# Patient Record
Sex: Female | Born: 1940 | Race: White | Hispanic: No | Smoking: Former smoker
Health system: Southern US, Community
[De-identification: ages and names within clinical notes are randomized; demographics above are authoritative.]

## PROBLEM LIST (undated history)

## (undated) DIAGNOSIS — G629 Polyneuropathy, unspecified: Secondary | ICD-10-CM

## (undated) DIAGNOSIS — D62 Acute posthemorrhagic anemia: Secondary | ICD-10-CM

## (undated) DIAGNOSIS — K219 Gastro-esophageal reflux disease without esophagitis: Secondary | ICD-10-CM

## (undated) DIAGNOSIS — K2100 Gastro-esophageal reflux disease with esophagitis, without bleeding: Secondary | ICD-10-CM

## (undated) DIAGNOSIS — H9190 Unspecified hearing loss, unspecified ear: Secondary | ICD-10-CM

## (undated) DIAGNOSIS — E538 Deficiency of other specified B group vitamins: Secondary | ICD-10-CM

## (undated) DIAGNOSIS — K449 Diaphragmatic hernia without obstruction or gangrene: Secondary | ICD-10-CM

## (undated) DIAGNOSIS — K5732 Diverticulitis of large intestine without perforation or abscess without bleeding: Secondary | ICD-10-CM

## (undated) DIAGNOSIS — I25810 Atherosclerosis of coronary artery bypass graft(s) without angina pectoris: Secondary | ICD-10-CM

## (undated) DIAGNOSIS — I714 Abdominal aortic aneurysm, without rupture, unspecified: Secondary | ICD-10-CM

## (undated) DIAGNOSIS — M1990 Unspecified osteoarthritis, unspecified site: Secondary | ICD-10-CM

## (undated) DIAGNOSIS — I739 Peripheral vascular disease, unspecified: Secondary | ICD-10-CM

## (undated) DIAGNOSIS — I499 Cardiac arrhythmia, unspecified: Secondary | ICD-10-CM

## (undated) DIAGNOSIS — Z720 Tobacco use: Secondary | ICD-10-CM

## (undated) DIAGNOSIS — E785 Hyperlipidemia, unspecified: Secondary | ICD-10-CM

## (undated) DIAGNOSIS — K635 Polyp of colon: Secondary | ICD-10-CM

## (undated) DIAGNOSIS — K589 Irritable bowel syndrome without diarrhea: Secondary | ICD-10-CM

## (undated) DIAGNOSIS — F329 Major depressive disorder, single episode, unspecified: Secondary | ICD-10-CM

## (undated) DIAGNOSIS — I10 Essential (primary) hypertension: Secondary | ICD-10-CM

## (undated) DIAGNOSIS — F32A Depression, unspecified: Secondary | ICD-10-CM

## (undated) DIAGNOSIS — R42 Dizziness and giddiness: Secondary | ICD-10-CM

## (undated) DIAGNOSIS — I00 Rheumatic fever without heart involvement: Secondary | ICD-10-CM

## (undated) HISTORY — DX: Diaphragmatic hernia without obstruction or gangrene: K44.9

## (undated) HISTORY — DX: Deficiency of other specified B group vitamins: E53.8

## (undated) HISTORY — DX: Gastro-esophageal reflux disease with esophagitis: K21.0

## (undated) HISTORY — DX: Diverticulitis of large intestine without perforation or abscess without bleeding: K57.32

## (undated) HISTORY — PX: PARTIAL HYSTERECTOMY: SHX80

## (undated) HISTORY — DX: Acute posthemorrhagic anemia: D62

## (undated) HISTORY — DX: Cardiac arrhythmia, unspecified: I49.9

## (undated) HISTORY — PX: CAROTID ENDARTERECTOMY: SUR193

## (undated) HISTORY — DX: Gastro-esophageal reflux disease with esophagitis, without bleeding: K21.00

## (undated) HISTORY — DX: Polyp of colon: K63.5

## (undated) HISTORY — DX: Abdominal aortic aneurysm, without rupture, unspecified: I71.40

## (undated) HISTORY — DX: Gastro-esophageal reflux disease without esophagitis: K21.9

## (undated) HISTORY — DX: Atherosclerosis of coronary artery bypass graft(s) without angina pectoris: I25.810

## (undated) HISTORY — PX: CLOSED MANIPULATION SHOULDER: SUR205

## (undated) HISTORY — PX: OTHER SURGICAL HISTORY: SHX169

## (undated) HISTORY — DX: Polyneuropathy, unspecified: G62.9

## (undated) HISTORY — PX: BACK SURGERY: SHX140

## (undated) HISTORY — DX: Tobacco use: Z72.0

## (undated) HISTORY — DX: Peripheral vascular disease, unspecified: I73.9

## (undated) HISTORY — DX: Rheumatic fever without heart involvement: I00

## (undated) HISTORY — DX: Abdominal aortic aneurysm, without rupture: I71.4

## (undated) HISTORY — PX: VASCULAR SURGERY: SHX849

## (undated) HISTORY — DX: Irritable bowel syndrome, unspecified: K58.9

## (undated) HISTORY — DX: Hyperlipidemia, unspecified: E78.5

## (undated) HISTORY — PX: ABDOMINAL HYSTERECTOMY: SHX81

---

## 1969-11-06 HISTORY — PX: LUMBAR DISC SURGERY: SHX700

## 1972-11-06 HISTORY — PX: APPENDECTOMY: SHX54

## 1974-11-06 HISTORY — PX: ABDOMINAL SURGERY: SHX537

## 2000-11-06 HISTORY — PX: ROTATOR CUFF REPAIR: SHX139

## 2000-11-06 HISTORY — PX: CHOLECYSTECTOMY: SHX55

## 2003-04-21 LAB — HM COLONOSCOPY

## 2004-11-06 HISTORY — PX: CORONARY ARTERY BYPASS GRAFT: SHX141

## 2005-01-17 ENCOUNTER — Encounter: Payer: Self-pay | Admitting: Cardiovascular Disease

## 2005-02-04 ENCOUNTER — Encounter: Payer: Self-pay | Admitting: Cardiovascular Disease

## 2005-03-06 ENCOUNTER — Encounter: Payer: Self-pay | Admitting: Cardiovascular Disease

## 2005-04-06 ENCOUNTER — Encounter: Payer: Self-pay | Admitting: Cardiovascular Disease

## 2005-11-15 ENCOUNTER — Ambulatory Visit: Payer: Self-pay | Admitting: Family Medicine

## 2005-11-20 ENCOUNTER — Ambulatory Visit: Payer: Self-pay | Admitting: Family Medicine

## 2005-11-23 ENCOUNTER — Ambulatory Visit: Payer: Self-pay | Admitting: Unknown Physician Specialty

## 2007-01-22 ENCOUNTER — Ambulatory Visit: Payer: Self-pay | Admitting: Unknown Physician Specialty

## 2007-11-07 HISTORY — PX: CERVICAL DISC SURGERY: SHX588

## 2007-12-06 ENCOUNTER — Other Ambulatory Visit: Payer: Self-pay

## 2007-12-06 ENCOUNTER — Ambulatory Visit: Payer: Self-pay | Admitting: Internal Medicine

## 2008-01-23 ENCOUNTER — Ambulatory Visit: Payer: Self-pay | Admitting: Unknown Physician Specialty

## 2008-04-02 ENCOUNTER — Ambulatory Visit: Payer: Self-pay | Admitting: Internal Medicine

## 2008-10-22 ENCOUNTER — Ambulatory Visit: Payer: Self-pay | Admitting: Internal Medicine

## 2009-02-03 ENCOUNTER — Ambulatory Visit: Payer: Self-pay | Admitting: Unknown Physician Specialty

## 2009-03-12 LAB — HM SIGMOIDOSCOPY

## 2009-06-17 ENCOUNTER — Ambulatory Visit: Payer: Self-pay | Admitting: Internal Medicine

## 2009-07-28 LAB — HM COLONOSCOPY

## 2009-12-15 ENCOUNTER — Other Ambulatory Visit: Payer: Self-pay | Admitting: Family

## 2010-02-11 ENCOUNTER — Ambulatory Visit: Payer: Self-pay | Admitting: Unknown Physician Specialty

## 2010-03-06 HISTORY — PX: HERNIA REPAIR: SHX51

## 2010-03-22 ENCOUNTER — Ambulatory Visit: Payer: Self-pay | Admitting: Surgery

## 2010-03-29 ENCOUNTER — Inpatient Hospital Stay: Payer: Self-pay | Admitting: Surgery

## 2010-07-26 ENCOUNTER — Ambulatory Visit: Payer: Self-pay | Admitting: Orthopedic Surgery

## 2011-01-03 ENCOUNTER — Ambulatory Visit: Payer: Self-pay | Admitting: Internal Medicine

## 2011-01-05 HISTORY — PX: OTHER SURGICAL HISTORY: SHX169

## 2011-02-14 ENCOUNTER — Ambulatory Visit: Payer: Self-pay | Admitting: Unknown Physician Specialty

## 2011-03-08 ENCOUNTER — Ambulatory Visit: Payer: Self-pay | Admitting: Unknown Physician Specialty

## 2011-03-10 ENCOUNTER — Ambulatory Visit: Payer: Self-pay | Admitting: Internal Medicine

## 2011-04-18 ENCOUNTER — Ambulatory Visit: Payer: Self-pay | Admitting: Internal Medicine

## 2011-04-24 ENCOUNTER — Other Ambulatory Visit: Payer: Self-pay | Admitting: Neurology

## 2011-04-24 DIAGNOSIS — G43909 Migraine, unspecified, not intractable, without status migrainosus: Secondary | ICD-10-CM

## 2011-04-24 DIAGNOSIS — H5710 Ocular pain, unspecified eye: Secondary | ICD-10-CM

## 2011-04-24 DIAGNOSIS — Z951 Presence of aortocoronary bypass graft: Secondary | ICD-10-CM

## 2011-04-24 DIAGNOSIS — G609 Hereditary and idiopathic neuropathy, unspecified: Secondary | ICD-10-CM

## 2011-04-25 ENCOUNTER — Other Ambulatory Visit: Payer: Self-pay | Admitting: Neurology

## 2011-04-25 DIAGNOSIS — G43909 Migraine, unspecified, not intractable, without status migrainosus: Secondary | ICD-10-CM

## 2011-04-25 DIAGNOSIS — Z951 Presence of aortocoronary bypass graft: Secondary | ICD-10-CM

## 2011-04-25 DIAGNOSIS — H5710 Ocular pain, unspecified eye: Secondary | ICD-10-CM

## 2011-04-25 DIAGNOSIS — G609 Hereditary and idiopathic neuropathy, unspecified: Secondary | ICD-10-CM

## 2011-04-29 ENCOUNTER — Inpatient Hospital Stay: Admission: RE | Admit: 2011-04-29 | Payer: Self-pay | Source: Ambulatory Visit

## 2011-04-29 ENCOUNTER — Other Ambulatory Visit: Payer: Self-pay

## 2011-04-30 ENCOUNTER — Ambulatory Visit
Admission: RE | Admit: 2011-04-30 | Discharge: 2011-04-30 | Disposition: A | Discharge status: Home / Self Care | Payer: Medicare Other | Source: Ambulatory Visit | Attending: Neurology | Admitting: Neurology

## 2011-04-30 DIAGNOSIS — Z951 Presence of aortocoronary bypass graft: Secondary | ICD-10-CM

## 2011-04-30 DIAGNOSIS — G43909 Migraine, unspecified, not intractable, without status migrainosus: Secondary | ICD-10-CM

## 2011-04-30 DIAGNOSIS — G609 Hereditary and idiopathic neuropathy, unspecified: Secondary | ICD-10-CM

## 2011-04-30 DIAGNOSIS — H5710 Ocular pain, unspecified eye: Secondary | ICD-10-CM

## 2011-04-30 MED ORDER — GADOBENATE DIMEGLUMINE 529 MG/ML IV SOLN
14.0000 mL | Freq: Once | INTRAVENOUS | Status: AC | PRN
  Administered 2011-04-30: 14 mL via INTRAVENOUS

## 2011-06-13 ENCOUNTER — Encounter: Payer: Self-pay | Admitting: Internal Medicine

## 2011-07-22 ENCOUNTER — Encounter: Payer: Self-pay | Admitting: Internal Medicine

## 2011-08-29 ENCOUNTER — Encounter: Payer: Self-pay | Admitting: Internal Medicine

## 2011-08-29 ENCOUNTER — Ambulatory Visit (INDEPENDENT_AMBULATORY_CARE_PROVIDER_SITE_OTHER): Payer: Medicare Other | Admitting: Internal Medicine

## 2011-08-29 DIAGNOSIS — Z720 Tobacco use: Secondary | ICD-10-CM | POA: Insufficient documentation

## 2011-08-29 DIAGNOSIS — E785 Hyperlipidemia, unspecified: Secondary | ICD-10-CM

## 2011-08-29 DIAGNOSIS — Z716 Tobacco abuse counseling: Secondary | ICD-10-CM

## 2011-08-29 DIAGNOSIS — M8580 Other specified disorders of bone density and structure, unspecified site: Secondary | ICD-10-CM | POA: Insufficient documentation

## 2011-08-29 DIAGNOSIS — M899 Disorder of bone, unspecified: Secondary | ICD-10-CM

## 2011-08-29 DIAGNOSIS — E559 Vitamin D deficiency, unspecified: Secondary | ICD-10-CM | POA: Insufficient documentation

## 2011-08-29 DIAGNOSIS — Z79899 Other long term (current) drug therapy: Secondary | ICD-10-CM

## 2011-08-29 DIAGNOSIS — J029 Acute pharyngitis, unspecified: Secondary | ICD-10-CM

## 2011-08-29 MED ORDER — AMOXICILLIN-POT CLAVULANATE 875-125 MG PO TABS: 1.0000 | ORAL_TABLET | Freq: Two times a day (BID) | ORAL | Status: AC

## 2011-08-29 NOTE — Progress Notes (Signed)
Subjective:    Patient ID: Sabrina Montoya, female    DOB: 1941/06/01, 70 y.o.   MRN: [id number]  HPI  70 yo white female with a history of CAD, tobacco abuse presents with a  1 .5 wk history of ear pain danpost nasal drip.  Her left ear pain radiates to her jaw, accompanied by bilateral neck pain.  Past Medical History  Diagnosis Date  . Diverticulitis of colon   . Rheumatic fever     possible at age 72  . Tobacco abuse    Current Outpatient Prescriptions on File Prior to Visit  Medication Sig Dispense Refill  . aspirin 81 MG tablet Take 81 mg by mouth daily.        Marland Kitchen atorvastatin (LIPITOR) 80 MG tablet Take 80 mg by mouth daily.        . benzonatate (TESSALON PERLES) 100 MG capsule Take 100 mg by mouth 3 (three) times daily as needed.        . clonazePAM (KLONOPIN) 0.5 MG tablet Take 0.5 mg by mouth 2 (two) times daily as needed.        . gabapentin (NEURONTIN) 100 MG capsule Take 100 mg by mouth daily.       . hydrochlorothiazide (HYDRODIURIL) 25 MG tablet Take 25 mg by mouth daily.        . metoprolol (LOPRESSOR) 50 MG tablet Take 50 mg by mouth 2 (two) times daily.        . nitroGLYCERIN (NITROSTAT) 0.4 MG SL tablet Place 0.4 mg under the tongue every 5 (five) minutes as needed. As needed for chest pain       . omeprazole (PRILOSEC) 40 MG capsule Take 40 mg by mouth daily.          Review of Systems  Constitutional: Negative for fever, chills and unexpected weight change.  HENT: Negative for hearing loss, ear pain, nosebleeds, congestion, sore throat, facial swelling, rhinorrhea, sneezing, mouth sores, trouble swallowing, neck pain, neck stiffness, voice change, postnasal drip, sinus pressure, tinnitus and ear discharge.   Eyes: Negative for pain, discharge, redness and visual disturbance.  Respiratory: Negative for cough, chest tightness, shortness of breath, wheezing and stridor.   Cardiovascular: Negative for chest pain, palpitations and leg swelling.  Musculoskeletal: Negative for  myalgias and arthralgias.  Skin: Negative for color change and rash.  Neurological: Negative for dizziness, weakness, light-headedness and headaches.  Hematological: Negative for adenopathy.       Objective:   Physical Exam  Constitutional: She is oriented to person, place, and time. She appears well-developed and well-nourished.  HENT:  Right Ear: Tympanic membrane is erythematous and bulging.  Left Ear: Tympanic membrane is erythematous and bulging.  Mouth/Throat: Oropharynx is clear and moist.  Eyes: EOM are normal. Pupils are equal, round, and reactive to light. No scleral icterus.  Neck: Normal range of motion. Neck supple. No JVD present. No thyromegaly present.  Cardiovascular: Normal rate, regular rhythm, normal heart sounds and intact distal pulses.   Pulmonary/Chest: Effort normal and breath sounds normal.  Abdominal: Soft. Bowel sounds are normal. She exhibits no mass. There is no tenderness.  Musculoskeletal: Normal range of motion. She exhibits no edema.  Lymphadenopathy:    She has no cervical adenopathy.  Neurological: She is alert and oriented to person, place, and time.  Skin: Skin is warm and dry.  Psychiatric: She has a normal mood and affect.          Assessment & Plan:  Sinusitis/otitis: with cervical  LAD.  Will treat wit augmentin,. Decongestants.

## 2011-08-29 NOTE — Patient Instructions (Addendum)
Debrox is the ear cleanser that works to dissolve earwax.   Or try the "Earcandle" that you light on fire (available at [facility] or other health food stores)   We are going to put you on augmentin (amoxicillin antibiotic) for coverage of any ear/throat infection.  Take twice daily with breakfast and dinner.    Do the sinus lavage twice daily with either alkalol or simply saline.  Return tomorrow at 8 am for fasting labs  (please drink a little water and ake your medicaitons)

## 2011-08-30 ENCOUNTER — Encounter: Payer: Self-pay | Admitting: Internal Medicine

## 2011-08-30 ENCOUNTER — Other Ambulatory Visit (INDEPENDENT_AMBULATORY_CARE_PROVIDER_SITE_OTHER): Payer: Medicare Other | Admitting: *Deleted

## 2011-08-30 DIAGNOSIS — Z79899 Other long term (current) drug therapy: Secondary | ICD-10-CM

## 2011-08-30 DIAGNOSIS — E785 Hyperlipidemia, unspecified: Secondary | ICD-10-CM | POA: Insufficient documentation

## 2011-08-30 DIAGNOSIS — E559 Vitamin D deficiency, unspecified: Secondary | ICD-10-CM

## 2011-08-30 DIAGNOSIS — Z716 Tobacco abuse counseling: Secondary | ICD-10-CM | POA: Insufficient documentation

## 2011-08-30 LAB — COMPREHENSIVE METABOLIC PANEL
ALT: 20 U/L (ref 0–35)
AST: 36 U/L (ref 0–37)
Alkaline Phosphatase: 72 U/L (ref 39–117)
Sodium: 143 mEq/L (ref 135–145)
Total Bilirubin: 0.4 mg/dL (ref 0.3–1.2)
Total Protein: 7.5 g/dL (ref 6.0–8.3)

## 2011-08-30 LAB — LIPID PANEL
LDL Cholesterol: 72 mg/dL (ref 0–99)
VLDL: 35.8 mg/dL (ref 0.0–40.0)

## 2011-08-30 NOTE — Assessment & Plan Note (Signed)
With prior use of Drisdol.  She has been taking 5000 units daily instead of 1000 at the recommendation of a health food store clerk.  Vi D level to be checked this week.

## 2011-08-30 NOTE — Assessment & Plan Note (Signed)
By prior DEXA Stann Mainland review DEXA and determine need for therapy

## 2011-08-31 LAB — VITAMIN D 25 HYDROXY (VIT D DEFICIENCY, FRACTURES): Vit D, 25-Hydroxy: 77 ng/mL (ref 30–89)

## 2011-09-11 ENCOUNTER — Telehealth: Payer: Self-pay | Admitting: Internal Medicine

## 2011-09-11 DIAGNOSIS — F419 Anxiety disorder, unspecified: Secondary | ICD-10-CM

## 2011-09-13 MED ORDER — CLONAZEPAM 0.5 MG PO TABS: 0.5000 mg | ORAL_TABLET | Freq: Two times a day (BID) | ORAL | Status: DC | PRN

## 2011-09-14 NOTE — Telephone Encounter (Signed)
Rx has been called in  

## 2011-10-10 ENCOUNTER — Ambulatory Visit: Payer: Medicare Other

## 2011-10-10 ENCOUNTER — Ambulatory Visit (INDEPENDENT_AMBULATORY_CARE_PROVIDER_SITE_OTHER): Payer: Medicare Other | Admitting: Internal Medicine

## 2011-10-10 DIAGNOSIS — Z23 Encounter for immunization: Secondary | ICD-10-CM

## 2011-11-03 ENCOUNTER — Other Ambulatory Visit: Payer: Self-pay | Admitting: *Deleted

## 2011-11-03 MED ORDER — ATORVASTATIN CALCIUM 80 MG PO TABS: 80.0000 mg | ORAL_TABLET | Freq: Every day | ORAL | Status: DC

## 2011-11-03 MED ORDER — HYDROCHLOROTHIAZIDE 25 MG PO TABS: 25.0000 mg | ORAL_TABLET | Freq: Every day | ORAL | Status: DC

## 2011-11-27 ENCOUNTER — Telehealth: Payer: Self-pay | Admitting: Internal Medicine

## 2011-11-27 NOTE — Telephone Encounter (Signed)
Patient wants to know when she should have blood work done to check on her cholesterol.

## 2011-11-27 NOTE — Telephone Encounter (Signed)
Her cholesterol was fine in late October, so typically every 6 months is fine.  Fasting lipids, CMET in april.

## 2011-11-27 NOTE — Telephone Encounter (Signed)
Patient notified.  Lab appt scheduled.

## 2011-12-04 ENCOUNTER — Other Ambulatory Visit: Payer: Self-pay | Admitting: *Deleted

## 2011-12-04 MED ORDER — METOPROLOL TARTRATE 50 MG PO TABS: 50.0000 mg | ORAL_TABLET | Freq: Two times a day (BID) | ORAL | Status: DC

## 2011-12-06 DIAGNOSIS — H2510 Age-related nuclear cataract, unspecified eye: Secondary | ICD-10-CM | POA: Diagnosis not present

## 2011-12-11 ENCOUNTER — Other Ambulatory Visit: Payer: Self-pay | Admitting: *Deleted

## 2011-12-11 NOTE — Telephone Encounter (Signed)
Faxed request from [facility], last filled 09/09/11.

## 2011-12-12 ENCOUNTER — Encounter: Payer: Self-pay | Admitting: Internal Medicine

## 2011-12-12 MED ORDER — GABAPENTIN 100 MG PO CAPS: 100.0000 mg | ORAL_CAPSULE | Freq: Three times a day (TID) | ORAL | Status: DC

## 2012-01-02 DIAGNOSIS — I714 Abdominal aortic aneurysm, without rupture: Secondary | ICD-10-CM | POA: Diagnosis not present

## 2012-01-02 DIAGNOSIS — I10 Essential (primary) hypertension: Secondary | ICD-10-CM | POA: Diagnosis not present

## 2012-01-02 DIAGNOSIS — I6529 Occlusion and stenosis of unspecified carotid artery: Secondary | ICD-10-CM | POA: Diagnosis not present

## 2012-01-02 DIAGNOSIS — F17200 Nicotine dependence, unspecified, uncomplicated: Secondary | ICD-10-CM | POA: Diagnosis not present

## 2012-01-05 ENCOUNTER — Encounter: Payer: Self-pay | Admitting: Internal Medicine

## 2012-01-05 DIAGNOSIS — I6529 Occlusion and stenosis of unspecified carotid artery: Secondary | ICD-10-CM | POA: Insufficient documentation

## 2012-01-19 DIAGNOSIS — I6529 Occlusion and stenosis of unspecified carotid artery: Secondary | ICD-10-CM | POA: Diagnosis not present

## 2012-01-19 DIAGNOSIS — I658 Occlusion and stenosis of other precerebral arteries: Secondary | ICD-10-CM | POA: Diagnosis not present

## 2012-01-19 DIAGNOSIS — Z87891 Personal history of nicotine dependence: Secondary | ICD-10-CM | POA: Diagnosis not present

## 2012-01-19 DIAGNOSIS — Z8673 Personal history of transient ischemic attack (TIA), and cerebral infarction without residual deficits: Secondary | ICD-10-CM | POA: Diagnosis not present

## 2012-01-19 DIAGNOSIS — I10 Essential (primary) hypertension: Secondary | ICD-10-CM | POA: Diagnosis not present

## 2012-01-23 DIAGNOSIS — I6529 Occlusion and stenosis of unspecified carotid artery: Secondary | ICD-10-CM | POA: Diagnosis not present

## 2012-01-23 DIAGNOSIS — G5620 Lesion of ulnar nerve, unspecified upper limb: Secondary | ICD-10-CM | POA: Diagnosis not present

## 2012-01-23 DIAGNOSIS — G5600 Carpal tunnel syndrome, unspecified upper limb: Secondary | ICD-10-CM | POA: Diagnosis not present

## 2012-02-01 DIAGNOSIS — Z01419 Encounter for gynecological examination (general) (routine) without abnormal findings: Secondary | ICD-10-CM | POA: Diagnosis not present

## 2012-02-05 DIAGNOSIS — Z01419 Encounter for gynecological examination (general) (routine) without abnormal findings: Secondary | ICD-10-CM | POA: Diagnosis not present

## 2012-02-12 ENCOUNTER — Telehealth: Payer: Self-pay | Admitting: *Deleted

## 2012-02-12 ENCOUNTER — Other Ambulatory Visit (INDEPENDENT_AMBULATORY_CARE_PROVIDER_SITE_OTHER): Payer: Medicare Other | Admitting: *Deleted

## 2012-02-12 DIAGNOSIS — E785 Hyperlipidemia, unspecified: Secondary | ICD-10-CM

## 2012-02-12 DIAGNOSIS — R197 Diarrhea, unspecified: Secondary | ICD-10-CM

## 2012-02-12 DIAGNOSIS — K5792 Diverticulitis of intestine, part unspecified, without perforation or abscess without bleeding: Secondary | ICD-10-CM

## 2012-02-12 LAB — COMPREHENSIVE METABOLIC PANEL
Albumin: 4 g/dL (ref 3.5–5.2)
Alkaline Phosphatase: 62 U/L (ref 39–117)
BUN: 28 mg/dL — ABNORMAL HIGH (ref 6–23)
Creatinine, Ser: 1 mg/dL (ref 0.4–1.2)
Glucose, Bld: 105 mg/dL — ABNORMAL HIGH (ref 70–99)
Potassium: 3.1 mEq/L — ABNORMAL LOW (ref 3.5–5.1)
Total Bilirubin: 0.3 mg/dL (ref 0.3–1.2)

## 2012-02-12 LAB — LIPID PANEL
Cholesterol: 142 mg/dL (ref 0–200)
LDL Cholesterol: 83 mg/dL (ref 0–99)
Triglycerides: 137 mg/dL (ref 0.0–149.0)

## 2012-02-12 NOTE — Telephone Encounter (Signed)
Done

## 2012-02-12 NOTE — Telephone Encounter (Signed)
Patient came in for labs today and while she was here she said that she has had several episodes of diverticulitis in the past and she feels like she is having issues with it again. She says that for the last couple of weeks she has had stomach pain and some diarrhea. I offered to make her an appt, but she says that she doesn't want to make an appt right now, but is asking if you would want to add on any other labs to check for this. I drew a cmet and lipid and a couple of extra tubes in case you wanted to add anything.

## 2012-02-12 NOTE — Progress Notes (Signed)
Addended by: Jobie Quaker on: 02/12/2012 01:52 PM   Modules accepted: Orders

## 2012-02-12 NOTE — Telephone Encounter (Signed)
xtra labs added,  thanks

## 2012-02-13 LAB — CBC WITH DIFFERENTIAL/PLATELET
Basophils Relative: 0 % (ref 0–1)
Eosinophils Absolute: 0.2 10*3/uL (ref 0.0–0.7)
Hemoglobin: 12.1 g/dL (ref 12.0–15.0)
MCH: 29.5 pg (ref 26.0–34.0)
MCHC: 32.8 g/dL (ref 30.0–36.0)
Monocytes Absolute: 0.4 10*3/uL (ref 0.1–1.0)
Monocytes Relative: 8 % (ref 3–12)
Neutrophils Relative %: 53 % (ref 43–77)

## 2012-02-13 LAB — C-REACTIVE PROTEIN: CRP: 0.17 mg/dL (ref <0.60)

## 2012-02-19 MED ORDER — POTASSIUM CHLORIDE ER 10 MEQ PO TBCR: 10.0000 meq | EXTENDED_RELEASE_TABLET | Freq: Two times a day (BID) | ORAL | Status: DC

## 2012-02-19 NOTE — Progress Notes (Signed)
Addended by: Darletta Moll A on: 02/19/2012 10:08 AM   Modules accepted: Orders

## 2012-02-21 DIAGNOSIS — Z1231 Encounter for screening mammogram for malignant neoplasm of breast: Secondary | ICD-10-CM | POA: Diagnosis not present

## 2012-02-21 DIAGNOSIS — Z1239 Encounter for other screening for malignant neoplasm of breast: Secondary | ICD-10-CM | POA: Diagnosis not present

## 2012-02-28 ENCOUNTER — Encounter: Payer: Self-pay | Admitting: Internal Medicine

## 2012-02-28 ENCOUNTER — Ambulatory Visit (INDEPENDENT_AMBULATORY_CARE_PROVIDER_SITE_OTHER): Payer: Medicare Other | Admitting: Internal Medicine

## 2012-02-28 VITALS — BP 106/60 | HR 59 | Temp 98.9°F | Resp 14 | Wt 148.0 lb

## 2012-02-28 DIAGNOSIS — Z780 Asymptomatic menopausal state: Secondary | ICD-10-CM

## 2012-02-28 DIAGNOSIS — M1990 Unspecified osteoarthritis, unspecified site: Secondary | ICD-10-CM

## 2012-02-28 DIAGNOSIS — M129 Arthropathy, unspecified: Secondary | ICD-10-CM | POA: Diagnosis not present

## 2012-02-28 DIAGNOSIS — N951 Menopausal and female climacteric states: Secondary | ICD-10-CM

## 2012-02-28 DIAGNOSIS — R140 Abdominal distension (gaseous): Secondary | ICD-10-CM

## 2012-02-28 DIAGNOSIS — N838 Other noninflammatory disorders of ovary, fallopian tube and broad ligament: Secondary | ICD-10-CM

## 2012-02-28 DIAGNOSIS — K5732 Diverticulitis of large intestine without perforation or abscess without bleeding: Secondary | ICD-10-CM | POA: Diagnosis not present

## 2012-02-28 DIAGNOSIS — R1032 Left lower quadrant pain: Secondary | ICD-10-CM

## 2012-02-28 DIAGNOSIS — R141 Gas pain: Secondary | ICD-10-CM

## 2012-02-28 DIAGNOSIS — R1013 Epigastric pain: Secondary | ICD-10-CM

## 2012-02-28 DIAGNOSIS — R143 Flatulence: Secondary | ICD-10-CM

## 2012-02-28 LAB — CBC WITH DIFFERENTIAL/PLATELET
Basophils Relative: 0.5 % (ref 0.0–3.0)
Eosinophils Absolute: 0.1 10*3/uL (ref 0.0–0.7)
HCT: 35.3 % — ABNORMAL LOW (ref 36.0–46.0)
Hemoglobin: 12 g/dL (ref 12.0–15.0)
Lymphocytes Relative: 39.3 % (ref 12.0–46.0)
Lymphs Abs: 1.9 10*3/uL (ref 0.7–4.0)
MCHC: 33.9 g/dL (ref 30.0–36.0)
Neutro Abs: 2.4 10*3/uL (ref 1.4–7.7)
RBC: 3.94 Mil/uL (ref 3.87–5.11)
RDW: 13.5 % (ref 11.5–14.6)

## 2012-02-28 MED ORDER — CIPROFLOXACIN HCL 250 MG PO TABS: 250.0000 mg | ORAL_TABLET | Freq: Two times a day (BID) | ORAL | Status: DC

## 2012-02-28 MED ORDER — METRONIDAZOLE 500 MG PO TABS: 500.0000 mg | ORAL_TABLET | Freq: Three times a day (TID) | ORAL | Status: DC

## 2012-02-28 NOTE — Progress Notes (Signed)
Patient ID: Sabrina Montoya, female   DOB: 07/31/1941, 71 y.o.   MRN: [id number]  Patient Active Problem List  Diagnoses  . Osteopenia  . Vitamin D deficiency  . Tobacco abuse  . Hyperlipidemia  . Tobacco abuse counseling  . Aneurysm of abdominal aorta  . Carotid artery stenosis, asymptomatic  . Abdominal pain, acute, left lower quadrant  . Abdominal pain, acute, epigastric    Subjective:  CC:   Chief Complaint  Patient presents with  . Diverticulitis    flare up    HPI:   Sabrina Montoya a 71 y.o. female who presents  Past Medical History  Diagnosis Date  . Diverticulitis of colon   . Rheumatic fever     possible at age 36  . Tobacco abuse   . Hyperlipidemia     Past Surgical History  Procedure Date  . Closed manipulation shoulder     left shoulder post physical therapy, Right shoulder redo (toggle bolts)  . Rotator cuff repair 2002    right shoulder Dr. Vernell Leep, [facility] in [place]  . Cea     Carotid stenosis found during workup for dysphagia,  [facility]  . Lumbar disc surgery 1971    L5, unssuccessful, fusion in 1983 successful (2 lumbar)  . Abdominal surgery 1976    for pain secondary to scar tissue, s/p apply  . 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 Hernia repair may 2011    Dr. Michela Pitcher  . Coronary artery bypass graft 2006    s/p triple bypass surgery, [facility]  . Cholecystectomy 2002    Dr. Michela Pitcher  . Coronary angiography 01/2011    one occluded artery with collateralization, other arteries patent  . Hernia repair   . Appendectomy 1974         The following portions of the patient's history were reviewed and updated as appropriate: Allergies, current medications, and problem list.    Review of Systems:   12 Pt  review of systems was negative except those addressed in the HPI,     History   Social History  . Marital Status: Married    Spouse Name: N/A    Number of Children: N/A  .  Years of Education: N/A   Occupational History  . Not on file.   Social History Main Topics  . Smoking status: Current Some Day Smoker -- 0.5 packs/day for 30 years  . Smokeless tobacco: Never Used   Comment: quit for 2 years after sinus infection and 6 months after heart surgery  . Alcohol Use: No  . Drug Use: No  . Sexually Active: Not on file   Other Topics Concern  . Not on file   Social History Narrative   Lives with spouse Sabrina Montoya    Objective:  BP 106/60  Pulse 59  Temp(Src) 98.9 F (37.2 C) (Oral)  Resp 14  Wt 148 lb (67.132 kg)  SpO2 97%  General appearance: alert, cooperative and appears stated age Ears: normal TM's and external ear canals both ears Throat: lips, mucosa, and tongue normal; teeth and gums normal Neck: no adenopathy, no carotid bruit, supple, symmetrical, trachea midline and thyroid not enlarged, symmetric, no tenderness/mass/nodules Back: symmetric, no curvature. ROM normal. No CVA tenderness. Lungs: clear to auscultation bilaterally Heart: regular rate and rhythm, S1, S2 normal, no murmur, click, rub or gallop Abdomen: soft, tender in mid epigastric regionand LLQ, bowel sounds normal; no masses,  no  organomegaly Pulses: 2+ and symmetric Skin: Skin color, texture, turgor normal. No rashes or lesions Lymph nodes: Cervical, supraclavicular, and axillary nodes normal.  Assessment and Plan: Abdominal pain, acute, left lower quadrant Her recent episode does imply some form of colitis given the increased stooling she has had,.  She has a history of diverticulosis with prior flares, but also has an AAA,  still has her ovaries and is a smoker, so the ddx is broad.  Discussed looking for signs of infection/inflammation with screening labs,an empiric tx for diverticulitis  but she weill need some form of definitive imaging.  Will recommend ct abdomen and pelvis with contrast to evaluate aorta, bowels and adnexa..  Abdominal pain, acute,  epigastric Etiology unclear.  She insists that the pain is due to her prior hernia repair .  She does not recall when her last EGD was. There are no signs of inflammation or infection currently by serology, will await results of CT and consider referral to hernia specialist  at [facility] for reevaluation of ventral hern  Arthritis She continues to have multiple joint aches particularly her hands , and after spending 25 minutes sorting out her history of abdominal pain, is asking for management of her joint pain.  Given the chronic nature of her pain, I sent serologies for RA and inflammatory states, all of which were negative.  I will evaluate her joints again at her return visit in 1 month    Updated Medication List Outpatient Encounter Prescriptions as of 02/28/2012  Medication Sig Dispense Refill  . aspirin 81 MG tablet Take 81 mg by mouth daily.        Marland Kitchen atorvastatin (LIPITOR) 80 MG tablet Take 1 tablet (80 mg total) by mouth daily.  90 tablet  1  . Belladonna Alk-Phenobarbital (DONNATAL PO) Take 1 tablet by mouth 2 (two) times daily.        . benzonatate (TESSALON PERLES) 100 MG capsule Take 100 mg by mouth 3 (three) times daily as needed.        . clonazePAM (KLONOPIN) 0.5 MG tablet Take 1 tablet (0.5 mg total) by mouth 2 (two) times daily as needed.  60 tablet  2  . estradiol (ESTRACE) 0.1 MG/GM vaginal cream Place 2 g vaginally daily.      Marland Kitchen gabapentin (NEURONTIN) 100 MG capsule Take 1 capsule (100 mg total) by mouth 3 (three) times daily.  90 capsule  11  . hydrochlorothiazide (HYDRODIURIL) 25 MG tablet Take 1 tablet (25 mg total) by mouth daily.  90 tablet  1  . metoprolol (LOPRESSOR) 50 MG tablet Take 1 tablet (50 mg total) by mouth 2 (two) times daily.  60 tablet  6  . nitroGLYCERIN (NITROSTAT) 0.4 MG SL tablet Place 0.4 mg under the tongue every 5 (five) minutes as needed. As needed for chest pain       . omeprazole (PRILOSEC) 40 MG capsule Take 40 mg by mouth daily.        . potassium  chloride (K-DUR) 10 MEQ tablet Take 1 tablet (10 mEq total) by mouth 2 (two) times daily.  14 tablet  0  . ciprofloxacin (CIPRO) 250 MG tablet Take 1 tablet (250 mg total) by mouth 2 (two) times daily.  28 tablet  0  . metroNIDAZOLE (FLAGYL) 500 MG tablet Take 1 tablet (500 mg total) by mouth 3 (three) times daily.  42 tablet  0

## 2012-02-28 NOTE — Patient Instructions (Addendum)
I will call you with the results of your labs.     Please sign a medical release form at the front desk for your GI doctor.   We will order imaging once I see your lab results.

## 2012-02-29 DIAGNOSIS — R1032 Left lower quadrant pain: Secondary | ICD-10-CM | POA: Insufficient documentation

## 2012-02-29 DIAGNOSIS — M1990 Unspecified osteoarthritis, unspecified site: Secondary | ICD-10-CM | POA: Insufficient documentation

## 2012-02-29 DIAGNOSIS — R1013 Epigastric pain: Secondary | ICD-10-CM | POA: Insufficient documentation

## 2012-02-29 LAB — ANA: Anti Nuclear Antibody(ANA): POSITIVE — AB

## 2012-02-29 LAB — ANTI-NUCLEAR AB-TITER (ANA TITER)

## 2012-02-29 LAB — RHEUMATOID FACTOR: Rhuematoid fact SerPl-aCnc: <10 IU/mL (ref <=14)

## 2012-02-29 NOTE — Assessment & Plan Note (Signed)
Etiology unclear.  She insists that the pain is due to her prior hernia repair .  She does not recall when her last EGD was. There are no signs of inflammation or infection currently by serology, will await results of CT and consider referral to hernia specialist  at [facility] for reevaluation of ventral hern

## 2012-02-29 NOTE — Assessment & Plan Note (Addendum)
Her recent episode does imply some form of colitis given the increased stooling she has had,.  She has a history of diverticulosis with prior flares, but also has an AAA,  still has her ovaries and is a smoker, so the ddx is broad.  Discussed looking for signs of infection/inflammation with screening labs,an empiric tx for diverticulitis  but she weill need some form of definitive imaging.  Will recommend ct abdomen and pelvis with contrast to evaluate aorta, bowels and adnexa.Marland Kitchen

## 2012-02-29 NOTE — Assessment & Plan Note (Signed)
She continues to have multiple joint aches particularly her hands , and after spending 25 minutes sorting out her history of abdominal pain, is asking for management of her joint pain.  Given the chronic nature of her pain, will send serologies of rautoimmune and inflammatory states and evaluated joint at her return visit in 1 month

## 2012-03-06 ENCOUNTER — Ambulatory Visit (HOSPITAL_COMMUNITY)
Admission: RE | Admit: 2012-03-06 | Discharge: 2012-03-06 | Disposition: A | Discharge status: Home / Self Care | Payer: Medicare Other | Source: Ambulatory Visit | Attending: Internal Medicine | Admitting: Internal Medicine

## 2012-03-06 ENCOUNTER — Telehealth: Payer: Self-pay | Admitting: *Deleted

## 2012-03-06 ENCOUNTER — Telehealth: Payer: Self-pay | Admitting: Internal Medicine

## 2012-03-06 DIAGNOSIS — I714 Abdominal aortic aneurysm, without rupture, unspecified: Secondary | ICD-10-CM | POA: Insufficient documentation

## 2012-03-06 DIAGNOSIS — R141 Gas pain: Secondary | ICD-10-CM | POA: Diagnosis not present

## 2012-03-06 DIAGNOSIS — R109 Unspecified abdominal pain: Secondary | ICD-10-CM | POA: Insufficient documentation

## 2012-03-06 DIAGNOSIS — R1032 Left lower quadrant pain: Secondary | ICD-10-CM | POA: Diagnosis not present

## 2012-03-06 DIAGNOSIS — N8111 Cystocele, midline: Secondary | ICD-10-CM | POA: Diagnosis not present

## 2012-03-06 DIAGNOSIS — K5732 Diverticulitis of large intestine without perforation or abscess without bleeding: Secondary | ICD-10-CM

## 2012-03-06 DIAGNOSIS — Z9889 Other specified postprocedural states: Secondary | ICD-10-CM | POA: Insufficient documentation

## 2012-03-06 DIAGNOSIS — R140 Abdominal distension (gaseous): Secondary | ICD-10-CM

## 2012-03-06 DIAGNOSIS — R143 Flatulence: Secondary | ICD-10-CM | POA: Insufficient documentation

## 2012-03-06 DIAGNOSIS — R142 Eructation: Secondary | ICD-10-CM | POA: Insufficient documentation

## 2012-03-06 MED ORDER — IOHEXOL 300 MG/ML  SOLN
100.0000 mL | Freq: Once | INTRAMUSCULAR | Status: AC | PRN
  Administered 2012-03-06: 100 mL via INTRAVENOUS

## 2012-03-06 NOTE — Telephone Encounter (Signed)
Her CT suggested that her stomach is inflamed because the wall of it is thickened.  I would like her to see her gastroenterologist for an endoscopy and biopsy of this area  ,  The bowels did not appear to be inflamed but she has a lot of diverticulum so she needs to be on a high fiber diet. Who is her gastroenterologist? wE will make the referal

## 2012-03-06 NOTE — Telephone Encounter (Signed)
IMPRESSION:  1. Apparent gastric wall thickening. Although this could  partially be due to underdistension, this is suspicious for  gastritis or other gastropathy. Endoscopy should be considered.  2. 3.4 cm infrarenal abdominal aortic aneurysm.  These results will be called to the ordering clinician or  representative by the Radiologist Assistant, and communication  documented in the PACS Dashboard.  3. Subtle possible irregular hepatic contour. Correlate with risk  factors for mild cirrhosis.  4. No evidence of ovarian mass.  5. Pelvic floor laxity with cystocele.   Dr.Tullo informed/SLS

## 2012-03-07 ENCOUNTER — Telehealth: Payer: Self-pay | Admitting: Internal Medicine

## 2012-03-07 ENCOUNTER — Ambulatory Visit (INDEPENDENT_AMBULATORY_CARE_PROVIDER_SITE_OTHER): Payer: Medicare Other | Admitting: Internal Medicine

## 2012-03-07 ENCOUNTER — Encounter: Payer: Self-pay | Admitting: Internal Medicine

## 2012-03-07 ENCOUNTER — Other Ambulatory Visit: Payer: Self-pay | Admitting: Internal Medicine

## 2012-03-07 VITALS — BP 104/58 | HR 62 | Temp 97.9°F | Resp 14 | Wt 148.5 lb

## 2012-03-07 DIAGNOSIS — I6529 Occlusion and stenosis of unspecified carotid artery: Secondary | ICD-10-CM | POA: Diagnosis not present

## 2012-03-07 DIAGNOSIS — M1990 Unspecified osteoarthritis, unspecified site: Secondary | ICD-10-CM

## 2012-03-07 DIAGNOSIS — I25810 Atherosclerosis of coronary artery bypass graft(s) without angina pectoris: Secondary | ICD-10-CM | POA: Insufficient documentation

## 2012-03-07 DIAGNOSIS — E876 Hypokalemia: Secondary | ICD-10-CM

## 2012-03-07 DIAGNOSIS — R1013 Epigastric pain: Secondary | ICD-10-CM

## 2012-03-07 DIAGNOSIS — M129 Arthropathy, unspecified: Secondary | ICD-10-CM

## 2012-03-07 DIAGNOSIS — F17200 Nicotine dependence, unspecified, uncomplicated: Secondary | ICD-10-CM

## 2012-03-07 DIAGNOSIS — I739 Peripheral vascular disease, unspecified: Secondary | ICD-10-CM | POA: Insufficient documentation

## 2012-03-07 DIAGNOSIS — Z720 Tobacco use: Secondary | ICD-10-CM

## 2012-03-07 DIAGNOSIS — R1032 Left lower quadrant pain: Secondary | ICD-10-CM

## 2012-03-07 DIAGNOSIS — I714 Abdominal aortic aneurysm, without rupture: Secondary | ICD-10-CM | POA: Insufficient documentation

## 2012-03-07 LAB — BASIC METABOLIC PANEL
BUN: 30 mg/dL — ABNORMAL HIGH (ref 6–23)
Chloride: 102 mEq/L (ref 96–112)
Creatinine, Ser: 1.1 mg/dL (ref 0.4–1.2)
Glucose, Bld: 94 mg/dL (ref 70–99)
Potassium: 3.4 mEq/L — ABNORMAL LOW (ref 3.5–5.1)

## 2012-03-07 LAB — MAGNESIUM: Magnesium: 1.3 mg/dL — ABNORMAL LOW (ref 1.5–2.5)

## 2012-03-07 MED ORDER — OMEPRAZOLE 40 MG PO CPDR: 40.0000 mg | DELAYED_RELEASE_CAPSULE | Freq: Two times a day (BID) | ORAL | Status: DC

## 2012-03-07 NOTE — Telephone Encounter (Signed)
Can you ask patient if she prefers [place] or [place]? If [employer],  [employer] [facility]

## 2012-03-07 NOTE — Assessment & Plan Note (Signed)
Secondary to hypomagnesemia, Mg 1.3 today.  Asymptomatic.  Will have patient return for 2 gm IM injections tomorrow and oral supplements.  Will need an EKG to rule out QT prolongation or other arrythmias that may necessitate need for brief hospitalization .

## 2012-03-07 NOTE — Assessment & Plan Note (Signed)
There were no signs of diverticuiltis or ovarian cyst on CT scan.

## 2012-03-07 NOTE — Assessment & Plan Note (Signed)
Infrarenal, noted on current CT done at [facility] for abdominal pain.  Diameter remains < 3 cm x 3 cm. Followup with Dr. Wyn Quaker.  Tobacco cessation again advised, and continue maintenance of low blood pressure paramount.

## 2012-03-07 NOTE — Assessment & Plan Note (Signed)
With prior CEA done.  Continue annual surveillance by Festus Barren.

## 2012-03-07 NOTE — Progress Notes (Signed)
Patient ID: Jerry Haugen, female   DOB: 12-03-1940, 71 y.o.   MRN: [id number]  Patient Active Problem List  Diagnoses  . Osteopenia  . Vitamin D deficiency  . Tobacco abuse  . Hyperlipidemia  . Tobacco abuse counseling  . Aneurysm of abdominal aorta  . Carotid artery stenosis, asymptomatic  . Abdominal pain, acute, left lower quadrant  . Abdominal pain, acute, epigastric  . Arthritis  . CAD (coronary artery disease), autologous vein bypass graft  . Peripheral vascular disease  . Hypokalemia    Subjective:  CC:   Chief Complaint  Patient presents with  . Follow-up    HPI:   Narya Beavin a 71 y.o. female who presents  Ms. Clingan returns for followup on hypokalemia, arthritis and abdominal pain with recent CT of abdomen and pelvis suggestive of gastropathy as the cause.  She continues to have chronic pain and nausea that she attributes to the mesh that Dr. Michela Pitcher used to repair her ventral hernia her in May 2011.  She is taking prilosec twice daily for the last 5 years for GERD and her last EGD was over 5 years ago.  Her current gastroenterologist is Pennie Banter, in [place] and she saw him as recently as 6 months ago   Her pain is constant and chronic.  Her labs showed no signs of infection or inflammation, but she had hypokalemia and was given oral supplements and returns today for recpeat K and a Mg level. .   Past Medical History  Diagnosis Date  . Diverticulitis of colon   . Rheumatic fever     possible at age 11  . Tobacco abuse   . Hyperlipidemia   . Abdominal aortic aneurysm without mention of rupture     infrarenal, stable, folllowed by Festus Barren  . CAD (coronary artery disease), autologous vein bypass graft   . Peripheral vascular disease     s/p CEA   . Tobacco abuse     Past Surgical History  Procedure Date  . Closed manipulation shoulder     left shoulder post physical therapy, Right shoulder redo (toggle bolts)  . Rotator cuff repair 2002    right shoulder Dr.  Vernell Leep, [facility] in [place]  . Cea     Carotid stenosis found during workup for dysphagia,  [facility]  . Lumbar disc surgery 1971    L5, unssuccessful, fusion in 1983 successful (2 lumbar)  . Abdominal surgery 1976    for pain secondary to scar tissue, s/p apply  . 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 Hernia repair may 2011    Dr. Michela Pitcher  . Coronary artery bypass graft 2006    s/p triple bypass surgery, [facility]  . Cholecystectomy 2002    Dr. Michela Pitcher  . Coronary angiography 01/2011    one occluded artery with collateralization, other arteries patent  . Hernia repair   . Appendectomy 1974         The following portions of the patient's history were reviewed and updated as appropriate: Allergies, current medications, and problem list.    Review of Systems:   12 Pt  review of systems was negative except those addressed in the HPI,     History   Social History  . Marital Status: Married    Spouse Name: N/A    Number of Children: N/A  . Years of Education: N/A   Occupational History  . Not on file.  Social History Main Topics  . Smoking status: Current Some Day Smoker -- 0.5 packs/day for 30 years  . Smokeless tobacco: Never Used   Comment: quit for 2 years after sinus infection and 6 months after heart surgery  . Alcohol Use: No  . Drug Use: No  . Sexually Active: Not on file   Other Topics Concern  . Not on file   Social History Narrative   Lives with spouse Dorinda Hill    Objective:  BP 104/58  Pulse 62  Temp(Src) 97.9 F (36.6 C) (Oral)  Resp 14  Wt 148 lb 8 oz (67.359 kg)  SpO2 100%  General appearance: alert, cooperative and appears stated age Ears: normal TM's and external ear canals both ears Throat: lips, mucosa, and tongue normal; teeth and gums normal Neck: no adenopathy, no carotid bruit, supple, symmetrical, trachea midline and thyroid not enlarged, symmetric, no  tenderness/mass/nodules Back: symmetric, no curvature. ROM normal. No CVA tenderness. Lungs: clear to auscultation bilaterally Heart: regular rate and rhythm, S1, S2 normal, no murmur, click, rub or gallop Abdomen: soft, non-tender; bowel sounds normal; no masses,  no organomegaly Pulses: 2+ and symmetric Skin: Skin color, texture, turgor normal. No rashes or lesions Lymph nodes: Cervical, supraclavicular, and axillary nodes normal.  Assessment and Plan:  Aneurysm of abdominal aorta Infrarenal, noted on current CT done at [facility] for abdominal pain.  Diameter remains < 3 cm x 3 cm. Followup with Dr. Wyn Quaker.  Tobacco cessation again advised, and continue maintenance of low blood pressure paramount.  Abdominal pain, acute, left lower quadrant There were no signs of diverticuiltis or ovarian cyst on CT scan.   Abdominal pain, acute, epigastric I am referring her back to Dr. Earlean Polka for EKG as she had gastric wall thickening on CT worrisome for CA  Arthritis Affecting multiple joints, with synovitis of PIP on right hand 2nd finger.  Serologies were normal except for a positive ANA,  No titer.  I have advised her to avoid NSAIDs given the finding of gastropathy on CT.  She did not seem to be satisfied with the diagnosis of OA so I have recommended that she get a second opinion from a rheumatologist. She now tells me that Dr. Earlean Polka has already discussed referring her to a rheumatologist in [place] but has not heard from his office yet.   Carotid artery stenosis, asymptomatic With prior CEA done.  Continue annual surveillance by Festus Barren.  Tobacco abuse Persistent, despite suffering from multiple sequelae including CAD and PAD. Counselling . Given again today. She appears to have little intention of quitting.   Hypokalemia Secondary to hypomagnesemia, Mg 1.3 today.  Asymptomatic.  Will have patient return for 2 gm IM injections tomorrow and oral supplements.  Will need an EKG to rule out QT  prolongation or other arrythmias that may necessitate need for brief hospitalization .    Updated Medication List Outpatient Encounter Prescriptions as of 03/07/2012  Medication Sig Dispense Refill  . aspirin 81 MG tablet Take 81 mg by mouth daily.        Marland Kitchen atorvastatin (LIPITOR) 80 MG tablet Take 1 tablet (80 mg total) by mouth daily.  90 tablet  1  . Belladonna Alk-Phenobarbital (DONNATAL PO) Take 1 tablet by mouth 2 (two) times daily.        . benzonatate (TESSALON PERLES) 100 MG capsule Take 100 mg by mouth 3 (three) times daily as needed.        . clonazePAM (KLONOPIN) 0.5 MG tablet  Take 1 tablet (0.5 mg total) by mouth 2 (two) times daily as needed.  60 tablet  2  . estradiol (ESTRACE) 0.1 MG/GM vaginal cream Place 2 g vaginally daily.      Marland Kitchen gabapentin (NEURONTIN) 100 MG capsule Take 1 capsule (100 mg total) by mouth 3 (three) times daily.  90 capsule  11  . hydrochlorothiazide (HYDRODIURIL) 25 MG tablet Take 1 tablet (25 mg total) by mouth daily.  90 tablet  1  . metoprolol (LOPRESSOR) 50 MG tablet Take 1 tablet (50 mg total) by mouth 2 (two) times daily.  60 tablet  6  . nitroGLYCERIN (NITROSTAT) 0.4 MG SL tablet Place 0.4 mg under the tongue every 5 (five) minutes as needed. As needed for chest pain       . potassium chloride (K-DUR) 10 MEQ tablet Take 1 tablet (10 mEq total) by mouth 2 (two) times daily.  14 tablet  0  . DISCONTD: omeprazole (PRILOSEC) 40 MG capsule Take 40 mg by mouth daily.        Marland Kitchen DISCONTD: ciprofloxacin (CIPRO) 250 MG tablet Take 1 tablet (250 mg total) by mouth 2 (two) times daily.  28 tablet  0  . DISCONTD: metroNIDAZOLE (FLAGYL) 500 MG tablet Take 1 tablet (500 mg total) by mouth 3 (three) times daily.  42 tablet  0     Orders Placed This Encounter  Procedures  . Basic metabolic panel  . Magnesium  . Ambulatory referral to Gastroenterology    No Follow-up on file.

## 2012-03-07 NOTE — Assessment & Plan Note (Signed)
Affecting multiple joints, with synovitis of PIP on right hand 2nd finger.  Serologies were normal except for a positive ANA,  No titer.  I have advised her to avoid NSAIDs given the finding of gastropathy on CT.  She did not seem to be satisfied with the diagnosis of OA so I have recommended that she get a second opinion from a rheumatologist. She now tells me that Dr. Earlean Polka has already discussed referring her to a rheumatologist in [place] but has not heard from his office yet.

## 2012-03-07 NOTE — Assessment & Plan Note (Signed)
Persistent, despite suffering from multiple sequelae including CAD and PAD. Counselling . Given again today. She appears to have little intention of quitting.

## 2012-03-07 NOTE — Telephone Encounter (Signed)
Patient notified, she stated her gastro doctor is Dr. Earlean Polka

## 2012-03-07 NOTE — Assessment & Plan Note (Signed)
I am referring her back to Dr. Earlean Polka for EKG as she had gastric wall thickening on CT worrisome for CA

## 2012-03-08 NOTE — Telephone Encounter (Signed)
Patient scheduled with dr Bluford Kaufmann

## 2012-03-11 ENCOUNTER — Ambulatory Visit (INDEPENDENT_AMBULATORY_CARE_PROVIDER_SITE_OTHER): Payer: Medicare Other | Admitting: Internal Medicine

## 2012-03-11 ENCOUNTER — Other Ambulatory Visit: Payer: Self-pay | Admitting: Internal Medicine

## 2012-03-11 DIAGNOSIS — Z136 Encounter for screening for cardiovascular disorders: Secondary | ICD-10-CM

## 2012-03-11 DIAGNOSIS — F419 Anxiety disorder, unspecified: Secondary | ICD-10-CM

## 2012-03-11 MED ORDER — MAGNESIUM OXIDE 400 (241.3 MG) MG PO TABS: 400.0000 mg | ORAL_TABLET | Freq: Two times a day (BID) | ORAL | Status: DC

## 2012-03-11 MED ORDER — MAGNESIUM SULFATE 50 % IJ SOLN
1.0000 g | Freq: Once | INTRAMUSCULAR | Status: AC
  Administered 2012-03-11: 1 g via INTRAMUSCULAR

## 2012-03-11 MED ORDER — CLONAZEPAM 0.5 MG PO TABS: 0.5000 mg | ORAL_TABLET | Freq: Two times a day (BID) | ORAL | Status: DC | PRN

## 2012-04-02 ENCOUNTER — Telehealth: Payer: Self-pay | Admitting: Internal Medicine

## 2012-04-02 NOTE — Telephone Encounter (Signed)
I have called and cancelled the appointment with Owens Shark.

## 2012-04-02 NOTE — Telephone Encounter (Signed)
Patient wants her appointment with Dr.Solik in [facility] [phone].

## 2012-04-02 NOTE — Telephone Encounter (Signed)
Pt calling about referral  On gi.  Pt stated she new nothing about the may appointment with Gavin Potters

## 2012-04-02 NOTE — Telephone Encounter (Signed)
Patient had an appt on 5/15/with Owens Shark, which Marj had left a detailed msg on machine.  Patient states she didn't know about appointment.  When I called to reschedule this appointment with GI they state that they call and send out a reminder to patient.  She is now scheduled with Owens Shark for 05/06/12 at 8:30 patient needs to arrive a few minutes early for this appointment.  I have left a message asking patient to return my call so I can advise her of the appointment.

## 2012-04-05 NOTE — Telephone Encounter (Signed)
Sent over patient information for Dr. Regenia Skeeter office to schedule patient they will contact patient.

## 2012-04-09 ENCOUNTER — Telehealth: Payer: Self-pay | Admitting: Internal Medicine

## 2012-04-09 NOTE — Telephone Encounter (Signed)
The other appointment was already cancelled and I have documented this appt in the referral.

## 2012-04-09 NOTE — Telephone Encounter (Signed)
Pt called wanting to cancel dr appointment with dr Markham Jordan.  She has an appointment dr Hilbert Corrigan @[facility]  on 7/25

## 2012-04-25 ENCOUNTER — Telehealth: Payer: Self-pay | Admitting: Internal Medicine

## 2012-04-25 NOTE — Telephone Encounter (Signed)
Caller: Shyna/Mother; PCP: Duncan Dull; CB#: [phone]; ; ; Call regarding Cough/Congestion;  Onset- 04/11/12  Per pt Afebrile but she has not checked her temp.  Pt c/o of cough states it is non productive. She has used OTC medicines without relief.  Emergent s/s of Cough protocol r/o. Pt to see provider within 4hrs. No appt's available, message sent to office for possible [place] appt.

## 2012-04-26 ENCOUNTER — Telehealth: Payer: Self-pay | Admitting: Internal Medicine

## 2012-04-26 NOTE — Telephone Encounter (Signed)
Caller: Susan/Patient; PCP: Duncan Dull; CB#: [phone]; ; ; Call regarding Appt Needs; states she called for triage 04/25/12 and did not hear back from office regarding an appt.  Declines new triage.  Per Epic, Dr. Darrick Huntsman had asked for patient to be notified that azithromycin was called in, and to sched appt x 30 miin for some time next week.  Patient advised; will pick up Rx.   [facility].

## 2012-04-26 NOTE — Telephone Encounter (Signed)
Call her in azithromycin 500 mg daily x 7 days an dgive her appt for next work , she needs a 30 minute

## 2012-04-29 ENCOUNTER — Ambulatory Visit (INDEPENDENT_AMBULATORY_CARE_PROVIDER_SITE_OTHER): Payer: Medicare Other | Admitting: Internal Medicine

## 2012-04-29 ENCOUNTER — Encounter: Payer: Self-pay | Admitting: Internal Medicine

## 2012-04-29 VITALS — BP 126/60 | HR 58 | Temp 99.0°F | Ht 64.0 in | Wt 148.0 lb

## 2012-04-29 DIAGNOSIS — E876 Hypokalemia: Secondary | ICD-10-CM

## 2012-04-29 DIAGNOSIS — R7989 Other specified abnormal findings of blood chemistry: Secondary | ICD-10-CM

## 2012-04-29 DIAGNOSIS — R1013 Epigastric pain: Secondary | ICD-10-CM

## 2012-04-29 DIAGNOSIS — J441 Chronic obstructive pulmonary disease with (acute) exacerbation: Secondary | ICD-10-CM | POA: Diagnosis not present

## 2012-04-29 DIAGNOSIS — R059 Cough, unspecified: Secondary | ICD-10-CM

## 2012-04-29 DIAGNOSIS — R05 Cough: Secondary | ICD-10-CM | POA: Diagnosis not present

## 2012-04-29 DIAGNOSIS — R52 Pain, unspecified: Secondary | ICD-10-CM

## 2012-04-29 LAB — HEPATIC FUNCTION PANEL
AST: 38 U/L — ABNORMAL HIGH (ref 0–37)
Albumin: 3.7 g/dL (ref 3.5–5.2)
Alkaline Phosphatase: 60 U/L (ref 39–117)
Total Protein: 6.7 g/dL (ref 6.0–8.3)

## 2012-04-29 LAB — BASIC METABOLIC PANEL
BUN: 23 mg/dL (ref 6–23)
GFR: 58.95 mL/min — ABNORMAL LOW (ref >60.00)
Glucose, Bld: 111 mg/dL — ABNORMAL HIGH (ref 70–99)
Potassium: 4.5 mEq/L (ref 3.5–5.1)

## 2012-04-29 MED ORDER — ALBUTEROL SULFATE HFA 108 (90 BASE) MCG/ACT IN AERS: 2.0000 | INHALATION_SPRAY | Freq: Four times a day (QID) | RESPIRATORY_TRACT | Status: DC | PRN

## 2012-04-29 MED ORDER — HYDROCOD POLST-CHLORPHEN POLST 10-8 MG/5ML PO LQCR: 5.0000 mL | Freq: Two times a day (BID) | ORAL | Status: DC | PRN

## 2012-04-29 MED ORDER — PREDNISONE (PAK) 10 MG PO TABS: ORAL_TABLET | ORAL | Status: AC

## 2012-04-29 NOTE — Assessment & Plan Note (Signed)
GI evaluation next week for abnormal stomach appearance on  Recent CT.

## 2012-04-29 NOTE — Assessment & Plan Note (Signed)
With hypomagnesemia recently treated.  Repeat due.

## 2012-04-29 NOTE — Assessment & Plan Note (Signed)
Adding prednisone and albuterol,  tussionex for cough.  She is finishing a 5 day course of azithromycin.

## 2012-04-29 NOTE — Patient Instructions (Addendum)
I am prescribing a cough medicine with hydrocodone in it to quiet your cough,  Along with a prednisone taper   You may use the inhaler 2 puffs every 6 hours as needed  to help your wheezing. It may speed your heart rate up for a little while. This is normal  Please take a copy of the CT scan images on a CD with you to your GI appointment.

## 2012-04-29 NOTE — Progress Notes (Signed)
Patient ID: Sabrina Montoya, female   DOB: 12-05-1940, 71 y.o.   MRN: [id number]   Patient Active Problem List  Diagnosis  . Osteopenia  . Vitamin D deficiency  . Tobacco abuse  . Hyperlipidemia  . Tobacco abuse counseling  . Aneurysm of abdominal aorta  . Carotid artery stenosis, asymptomatic  . Abdominal pain, acute, left lower quadrant  . Abdominal pain, acute, epigastric  . Arthritis  . CAD (coronary artery disease), autologous vein bypass graft  . Peripheral vascular disease  . Hypokalemia  . COPD exacerbation    Subjective:  CC:   Chief Complaint  Patient presents with  . Cough  . congestion    HPI:   Sabrina Montoya a 71 y.o. female who presents for evaluation of a deep productive cough,  Nonproductive,  Started on June 8th but didn't call office until last Thursday.,  Was given rx for azithromycin last Friday since she could not be seen that day.  Since she started the medication the cough has improved somewhat .  Clear nasal drainage. Has quit smoking for the last 12 days.  Some wheezing at home.    Follow up on abdominal pain, chronic.  She underwent ventral hernia repair with mesh 2 years ago and has had persistent upper abdominal pain since then. Scheduled to see gastroenterologist next week .  Recent CT suggested stomach wall thickening and possible cirrhosis appearance.     Past Medical History  Diagnosis Date  . Diverticulitis of colon   . Rheumatic fever     possible at age 58  . Tobacco abuse   . Hyperlipidemia   . Abdominal aortic aneurysm without mention of rupture     infrarenal, stable, folllowed by Festus Barren  . CAD (coronary artery disease), autologous vein bypass graft   . Peripheral vascular disease     s/p CEA   . Tobacco abuse     Past Surgical History  Procedure Date  . Closed manipulation shoulder     left shoulder post physical therapy, Right shoulder redo (toggle bolts)  . Rotator cuff repair 2002    right shoulder Dr. Vernell Leep, [facility] in [place]  . Cea     Carotid stenosis found during workup for dysphagia,  [facility]  . Lumbar disc surgery 1971    L5, unssuccessful, fusion in 1983 successful (2 lumbar)  . Abdominal surgery 1976    for pain secondary to scar tissue, s/p apply  . 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 Hernia repair may 2011    Dr. Michela Pitcher  . Coronary artery bypass graft 2006    s/p triple bypass surgery, [facility]  . Cholecystectomy 2002    Dr. Michela Pitcher  . Coronary angiography 01/2011    one occluded artery with collateralization, other arteries patent  . Hernia repair   . Appendectomy 1974    The following portions of the patient's history were reviewed and updated as appropriate: Allergies, current medications, and problem list.    Review of Systems Cough, malaise, wheezing,  The rest of a comprehensive review of systems was negative except those addressed in the HPI,     History   Social History  . Marital Status: Married    Spouse Name: N/A    Number of Children: N/A  . Years of Education: N/A   Occupational History  . Not on file.   Social History Main Topics  . Smoking status: Current  Some Day Smoker -- 0.5 packs/day for 30 years  . Smokeless tobacco: Never Used   Comment: quit for 2 years after sinus infection and 6 months after heart surgery  . Alcohol Use: No  . Drug Use: No  . Sexually Active: Not on file   Other Topics Concern  . Not on file   Social History Narrative   Lives with spouse Dorinda Hill    Objective:  BP 126/60  Pulse 58  Temp 99 F (37.2 C) (Oral)  Ht 5\' 4"  (1.626 m)  Wt 148 lb (67.132 kg)  BMI 25.40 kg/m2  SpO2 99%  General appearance: alert, cooperative and appears stated age Ears: normal TM's and external ear canals both ears Throat: lips, mucosa, and tongue normal; teeth and gums normal Neck: no adenopathy, no carotid bruit, supple, symmetrical, trachea midline and thyroid not  enlarged, symmetric, no tenderness/mass/nodules Back: symmetric, no curvature. ROM normal. No CVA tenderness. Lungs: clear to auscultation bilaterally Heart: regular rate and rhythm, S1, S2 normal, no murmur, click, rub or gallop Abdomen: soft, non-tender; bowel sounds normal; no masses,  no organomegaly Pulses: 2+ and symmetric Skin: Skin color, texture, turgor normal. No rashes or lesions Lymph nodes: Cervical, supraclavicular, and axillary nodes normal.  Assessment and Plan:  COPD exacerbation Adding prednisone and albuterol,  tussionex for cough.  She is finishing a 5 day course of azithromycin.   Abdominal pain, acute, epigastric GI evaluation next week for abnormal stomach appearance on  Recent CT.    Hypokalemia With hypomagnesemia recently treated.  Repeat due.   Updated Medication List Outpatient Encounter Prescriptions as of 04/29/2012  Medication Sig Dispense Refill  . aspirin 81 MG tablet Take 81 mg by mouth daily.        Marland Kitchen atorvastatin (LIPITOR) 80 MG tablet Take 1 tablet (80 mg total) by mouth daily.  90 tablet  1  . Belladonna Alk-Phenobarbital (DONNATAL PO) Take 1 tablet by mouth 2 (two) times daily.        . clonazePAM (KLONOPIN) 0.5 MG tablet Take 1 tablet (0.5 mg total) by mouth 2 (two) times daily as needed.  60 tablet  2  . estradiol (ESTRACE) 0.1 MG/GM vaginal cream Place 2 g vaginally daily.      Marland Kitchen gabapentin (NEURONTIN) 100 MG capsule Take 1 capsule (100 mg total) by mouth 3 (three) times daily.  90 capsule  11  . hydrochlorothiazide (HYDRODIURIL) 25 MG tablet Take 1 tablet (25 mg total) by mouth daily.  90 tablet  1  . magnesium oxide (MAG-OX 400) 400 (241.3 MG) MG tablet Take 1 tablet (400 mg total) by mouth 2 (two) times daily.  60 tablet  0  . metoprolol (LOPRESSOR) 50 MG tablet Take 1 tablet (50 mg total) by mouth 2 (two) times daily.  60 tablet  6  . nitroGLYCERIN (NITROSTAT) 0.4 MG SL tablet Place 0.4 mg under the tongue every 5 (five) minutes as needed.  As needed for chest pain       . omeprazole (PRILOSEC) 40 MG capsule Take 1 capsule (40 mg total) by mouth 2 (two) times daily.  60 capsule  5  . potassium chloride (K-DUR) 10 MEQ tablet Take 1 tablet (10 mEq total) by mouth 2 (two) times daily.  14 tablet  0  . albuterol (PROVENTIL HFA;VENTOLIN HFA) 108 (90 BASE) MCG/ACT inhaler Inhale 2 puffs into the lungs every 6 (six) hours as needed for wheezing.  1 Inhaler  2  . chlorpheniramine-HYDROcodone (TUSSIONEX) 10-8 MG/5ML LQCR Take  5 mLs by mouth every 12 (twelve) hours as needed (cough).  240 mL  0  . predniSONE (STERAPRED UNI-PAK) 10 MG tablet 6 tablets on Day 1 , then reduce by 1 tablet daily until gone  21 tablet  0  . DISCONTD: benzonatate (TESSALON PERLES) 100 MG capsule Take 100 mg by mouth 3 (three) times daily as needed.           Orders Placed This Encounter  Procedures  . Basic metabolic panel  . Magnesium  . Hepatic function panel    No Follow-up on file.

## 2012-04-30 ENCOUNTER — Emergency Department: Payer: Self-pay | Admitting: Emergency Medicine

## 2012-04-30 DIAGNOSIS — I10 Essential (primary) hypertension: Secondary | ICD-10-CM | POA: Diagnosis not present

## 2012-04-30 DIAGNOSIS — Z951 Presence of aortocoronary bypass graft: Secondary | ICD-10-CM | POA: Diagnosis not present

## 2012-04-30 LAB — COMPREHENSIVE METABOLIC PANEL
Albumin: 3.9 g/dL (ref 3.4–5.0)
Alkaline Phosphatase: 89 U/L (ref 50–136)
Bilirubin,Total: 0.5 mg/dL (ref 0.2–1.0)
Calcium, Total: 8.3 mg/dL — ABNORMAL LOW (ref 8.5–10.1)
Chloride: 106 mmol/L (ref 98–107)
Creatinine: 1.05 mg/dL (ref 0.60–1.30)
EGFR (African American): >60
EGFR (Non-African Amer.): 54 — ABNORMAL LOW
Glucose: 178 mg/dL — ABNORMAL HIGH (ref 65–99)
Potassium: 3.9 mmol/L (ref 3.5–5.1)

## 2012-04-30 LAB — URINALYSIS, COMPLETE
Bacteria: NONE SEEN
Bilirubin,UR: NEGATIVE
Ph: 5 (ref 4.5–8.0)
RBC,UR: 1 /HPF (ref 0–5)
Specific Gravity: 1.02 (ref 1.003–1.030)

## 2012-04-30 LAB — CBC
HCT: 35.9 % (ref 35.0–47.0)
HGB: 12.2 g/dL (ref 12.0–16.0)
MCH: 30 pg (ref 26.0–34.0)
MCHC: 34 g/dL (ref 32.0–36.0)
Platelet: 197 10*3/uL (ref 150–440)
RBC: 4.07 10*6/uL (ref 3.80–5.20)
RDW: 13.4 % (ref 11.5–14.5)
WBC: 4.9 10*3/uL (ref 3.6–11.0)

## 2012-04-30 MED ORDER — MAGNESIUM OXIDE 400 (241.3 MG) MG PO TABS: 400.0000 mg | ORAL_TABLET | Freq: Two times a day (BID) | ORAL | Status: DC

## 2012-04-30 NOTE — Addendum Note (Signed)
Addended by: Duncan Dull on: 04/30/2012 02:15 PM   Modules accepted: Orders

## 2012-05-01 ENCOUNTER — Ambulatory Visit: Payer: Medicare Other

## 2012-05-03 ENCOUNTER — Telehealth: Payer: Self-pay | Admitting: Internal Medicine

## 2012-05-03 NOTE — Telephone Encounter (Signed)
Patient called and wanted to know since she had the magnesium infusion can she go back on the Prilosec and hydrochlorothiazide.  Please advise.

## 2012-05-03 NOTE — Telephone Encounter (Signed)
Patient returned call and was notified. Lab appt scheduled.

## 2012-05-03 NOTE — Telephone Encounter (Signed)
Left message asking patient to return call. 

## 2012-05-03 NOTE — Telephone Encounter (Signed)
I would not resume the hctz.  If she resumes the prilosec she needs to take the magnesium oxide supplement twice daily and we will need to repeat her mg level in on month with a BMET

## 2012-05-06 ENCOUNTER — Emergency Department: Payer: Self-pay | Admitting: Unknown Physician Specialty

## 2012-05-06 ENCOUNTER — Telehealth: Payer: Self-pay | Admitting: Internal Medicine

## 2012-05-06 DIAGNOSIS — E860 Dehydration: Secondary | ICD-10-CM | POA: Diagnosis not present

## 2012-05-06 DIAGNOSIS — R197 Diarrhea, unspecified: Secondary | ICD-10-CM | POA: Diagnosis not present

## 2012-05-06 DIAGNOSIS — R5383 Other fatigue: Secondary | ICD-10-CM | POA: Diagnosis not present

## 2012-05-06 DIAGNOSIS — R5381 Other malaise: Secondary | ICD-10-CM | POA: Diagnosis not present

## 2012-05-06 DIAGNOSIS — I498 Other specified cardiac arrhythmias: Secondary | ICD-10-CM | POA: Diagnosis not present

## 2012-05-06 DIAGNOSIS — Z79899 Other long term (current) drug therapy: Secondary | ICD-10-CM | POA: Diagnosis not present

## 2012-05-06 DIAGNOSIS — I10 Essential (primary) hypertension: Secondary | ICD-10-CM | POA: Diagnosis not present

## 2012-05-06 LAB — CBC
MCH: 30.1 pg (ref 26.0–34.0)
MCHC: 33.5 g/dL (ref 32.0–36.0)
Platelet: 207 10*3/uL (ref 150–440)

## 2012-05-06 LAB — TROPONIN I: Troponin-I: <0.02 ng/mL

## 2012-05-06 LAB — BASIC METABOLIC PANEL
BUN: 33 mg/dL — ABNORMAL HIGH (ref 7–18)
Calcium, Total: 8 mg/dL — ABNORMAL LOW (ref 8.5–10.1)
Chloride: 110 mmol/L — ABNORMAL HIGH (ref 98–107)
Co2: 24 mmol/L (ref 21–32)
Creatinine: 1.06 mg/dL (ref 0.60–1.30)
EGFR (African American): >60
Glucose: 96 mg/dL (ref 65–99)
Osmolality: 292 (ref 275–301)
Potassium: 3.6 mmol/L (ref 3.5–5.1)

## 2012-05-06 LAB — MAGNESIUM: Magnesium: 1.5 mg/dL — ABNORMAL LOW

## 2012-05-06 LAB — HEPATIC FUNCTION PANEL A (ARMC)
Albumin: 3.5 g/dL (ref 3.4–5.0)
Alkaline Phosphatase: 66 U/L (ref 50–136)
Bilirubin, Direct: <0.1 mg/dL (ref 0.00–0.20)
SGOT(AST): 41 U/L — ABNORMAL HIGH (ref 15–37)
SGPT (ALT): 39 U/L

## 2012-05-06 NOTE — Telephone Encounter (Signed)
Patient called and stated when she woke up this morning she was dizzy so she checked her BP and it was 110/44.  She stated all day she has been "staggering" around and still dizzy.  At 2 pm her BP was 87/48 and at 4 pm it was 90/45 heart rate is 50.  She didn't know if it had anything to do with the magnesium infusion she received last week, I advised her that since it was so late in the day and her BP was dropping that she should go to the ER.

## 2012-05-06 NOTE — Telephone Encounter (Signed)
Agree with advice.  magmesium infused 4 days ago does not cause hypotension .

## 2012-05-07 NOTE — Telephone Encounter (Signed)
Left message notifying patient.

## 2012-05-16 ENCOUNTER — Telehealth: Payer: Self-pay | Admitting: Internal Medicine

## 2012-05-16 NOTE — Telephone Encounter (Signed)
Patient needing a follow up visit with /dr.Tullo from ER visit nothing available.

## 2012-05-17 NOTE — Telephone Encounter (Signed)
Its fine to schedule the next available appt.

## 2012-05-20 NOTE — Telephone Encounter (Signed)
Left message for patient to return call about appointment date and time.

## 2012-05-30 DIAGNOSIS — R109 Unspecified abdominal pain: Secondary | ICD-10-CM | POA: Diagnosis not present

## 2012-05-31 ENCOUNTER — Encounter: Payer: Self-pay | Admitting: Internal Medicine

## 2012-05-31 ENCOUNTER — Ambulatory Visit (INDEPENDENT_AMBULATORY_CARE_PROVIDER_SITE_OTHER): Payer: Medicare Other | Admitting: Internal Medicine

## 2012-05-31 VITALS — BP 122/68 | HR 60 | Temp 98.3°F | Resp 14 | Wt 152.5 lb

## 2012-05-31 DIAGNOSIS — R001 Bradycardia, unspecified: Secondary | ICD-10-CM

## 2012-05-31 DIAGNOSIS — E876 Hypokalemia: Secondary | ICD-10-CM

## 2012-05-31 DIAGNOSIS — R079 Chest pain, unspecified: Secondary | ICD-10-CM

## 2012-05-31 DIAGNOSIS — Z7189 Other specified counseling: Secondary | ICD-10-CM

## 2012-05-31 DIAGNOSIS — R059 Cough, unspecified: Secondary | ICD-10-CM

## 2012-05-31 DIAGNOSIS — I498 Other specified cardiac arrhythmias: Secondary | ICD-10-CM

## 2012-05-31 DIAGNOSIS — J441 Chronic obstructive pulmonary disease with (acute) exacerbation: Secondary | ICD-10-CM

## 2012-05-31 DIAGNOSIS — R05 Cough: Secondary | ICD-10-CM | POA: Diagnosis not present

## 2012-05-31 DIAGNOSIS — F17200 Nicotine dependence, unspecified, uncomplicated: Secondary | ICD-10-CM

## 2012-05-31 DIAGNOSIS — Z716 Tobacco abuse counseling: Secondary | ICD-10-CM

## 2012-05-31 LAB — CBC WITH DIFFERENTIAL/PLATELET
Eosinophils Relative: 3.9 % (ref 0.0–5.0)
Lymphocytes Relative: 38.6 % (ref 12.0–46.0)
MCV: 88.9 fl (ref 78.0–100.0)
Monocytes Absolute: 0.4 10*3/uL (ref 0.1–1.0)
Monocytes Relative: 10.1 % (ref 3.0–12.0)
Neutrophils Relative %: 46.8 % (ref 43.0–77.0)
Platelets: 213 10*3/uL (ref 150.0–400.0)
RBC: 3.49 Mil/uL — ABNORMAL LOW (ref 3.87–5.11)
WBC: 3.6 10*3/uL — ABNORMAL LOW (ref 4.5–10.5)

## 2012-05-31 LAB — BASIC METABOLIC PANEL
Chloride: 104 mEq/L (ref 96–112)
GFR: 52.19 mL/min — ABNORMAL LOW (ref >60.00)
Glucose, Bld: 121 mg/dL — ABNORMAL HIGH (ref 70–99)
Potassium: 4.5 mEq/L (ref 3.5–5.1)
Sodium: 140 mEq/L (ref 135–145)

## 2012-05-31 MED ORDER — HYDROCOD POLST-CHLORPHEN POLST 10-8 MG/5ML PO LQCR: 5.0000 mL | Freq: Two times a day (BID) | ORAL | Status: DC | PRN

## 2012-05-31 MED ORDER — BENZONATATE 100 MG PO CAPS: 200.0000 mg | ORAL_CAPSULE | Freq: Three times a day (TID) | ORAL | Status: DC | PRN

## 2012-05-31 MED ORDER — METOPROLOL TARTRATE 25 MG PO TABS: 25.0000 mg | ORAL_TABLET | Freq: Two times a day (BID) | ORAL | Status: DC

## 2012-05-31 NOTE — Patient Instructions (Addendum)
I am reducing your  metoprolol dose by changing the pill to a 25 mg tablet.  Cut it in half and take 1/2 tablet twice daily   We will check your mg level again today.  Do not resume the hctz.   Try mucinex (guaifenesin) 600 mg twice daily with a full glass of water.  To loosen  your secretions   Tussionex for nighttime cough or daytime severe cough ,  Cough capsules 200 mg dose sent in   You can also flush with simply saline to clear any post nasal drip.  Unilateral x ray of left side to rule out fracture  At [facility]

## 2012-05-31 NOTE — Progress Notes (Signed)
Patient ID: Sabrina Montoya, female   DOB: 01-Oct-1941, 71 y.o.   MRN: [id number] Patient Active Problem List  Diagnosis  . Osteopenia  . Vitamin D deficiency  . Tobacco abuse  . Hyperlipidemia  . Tobacco abuse counseling  . Aneurysm of abdominal aorta  . Carotid artery stenosis, asymptomatic  . Arthritis  . CAD (coronary artery disease), autologous vein bypass graft  . Peripheral vascular disease  . Hypokalemia  . COPD exacerbation  . Hypomagnesemia  . Bradycardia    Subjective:  CC:   Chief Complaint  Patient presents with  . Follow-up    ER    HPI:   Sabrina Montoya a 71 y.o. female who presents for ER follow up for recurrent hypomagnesemia with bradycardia , last ER visit was July 1, Mg  was 1.5  prior visit Jun 25 mg was 0.9.  Was discharged home but metoprolol was not stopped.  Has been taking mg oxide bid since then,  GI evaluation underway with EGD and colonoscopy ordered due to abnormal appearance of stomach on recent C, which was done due to chronic abdominal pain and nausea since her hernia repair,  Resulting in wt loss and hypomagnesemia.     She has quit smoking as of early June, but continues to have a persistent nonproductive cough due to thick secretions,  Has rib pain due to coughing so hard, wonders if she has broken a rib.     Past Medical History  Diagnosis Date  . Diverticulitis of colon   . Rheumatic fever     possible at age 89  . Tobacco abuse   . Hyperlipidemia   . Abdominal aortic aneurysm without mention of rupture     infrarenal, stable, folllowed by Festus Barren  . CAD (coronary artery disease), autologous vein bypass graft   . Peripheral vascular disease     s/p CEA   . Tobacco abuse     Past Surgical History  Procedure Date  . Closed manipulation shoulder     left shoulder post physical therapy, Right shoulder redo (toggle bolts)  . Rotator cuff repair 2002    right shoulder Dr. Vernell Leep, [facility] in [place]  . Cea     Carotid stenosis  found during workup for dysphagia,  [facility]  . Lumbar disc surgery 1971    L5, unssuccessful, fusion in 1983 successful (2 lumbar)  . Abdominal surgery 1976    for pain secondary to scar tissue, s/p apply  . 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 Hernia repair may 2011    Dr. Michela Pitcher  . Coronary artery bypass graft 2006    s/p triple bypass surgery, [facility]  . Cholecystectomy 2002    Dr. Michela Pitcher  . Coronary angiography 01/2011    one occluded artery with collateralization, other arteries patent  . Hernia repair   . Appendectomy 1974         The following portions of the patient's history were reviewed and updated as appropriate: Allergies, current medications, and problem list.    Review of Systems:   Review of Systems  Constitutional: Negative for weight loss.  HENT: Negative.   Eyes: Negative.   Respiratory: Positive for cough. Negative for hemoptysis and sputum production.   Cardiovascular: Positive for chest pain.  Gastrointestinal: Negative.   Genitourinary: Negative.   Musculoskeletal: Negative.   Skin: Negative.   Neurological: Negative.   Endo/Heme/Allergies: Negative.   Psychiatric/Behavioral: Negative.  History   Social History  . Marital Status: Married    Spouse Name: N/A    Number of Children: N/A  . Years of Education: N/A   Occupational History  . Not on file.   Social History Main Topics  . Smoking status: Current Some Day Smoker -- 0.5 packs/day for 30 years  . Smokeless tobacco: Never Used   Comment: quit for 2 years after sinus infection and 6 months after heart surgery  . Alcohol Use: No  . Drug Use: No  . Sexually Active: Not on file   Other Topics Concern  . Not on file   Social History Narrative   Lives with spouse Dorinda Hill    Objective:  BP 122/68  Pulse 60  Temp 98.3 F (36.8 C) (Oral)  Resp 14  Wt 152 lb 8 oz (69.174 kg)  SpO2 97%  General appearance:  alert, cooperative and appears stated age Ears: normal TM's and external ear canals both ears Throat: lips, mucosa, and tongue normal; teeth and gums normal Neck: no adenopathy, no carotid bruit, supple, symmetrical, trachea midline and thyroid not enlarged, symmetric, no tenderness/mass/nodules Back: symmetric, no curvature. ROM normal. No CVA tenderness. Lungs: clear to auscultation bilaterally Heart: regular rate and rhythm, S1, S2 normal, no murmur, click, rub or gallop Abdomen: soft, non-tender; bowel sounds normal; no masses,  no organomegaly Pulses: 2+ and symmetric Skin: Skin color, texture, turgor normal. No rashes or lesions Lymph nodes: Cervical, supraclavicular, and axillary nodes normal.  Assessment and Plan:  COPD exacerbation Her wheezing has resolved but she continues to have a nagging cough,  I have ordered unilateral rib films because of her tenderness to palpation over lower ribs on left.   Tobacco abuse counseling She has been tobacco free for nearly 30 days .  Encouragement given,   Hypokalemia Secondary to hypomagnesemia, which may be due to hctz vs poor appetite.    Hypomagnesemia Recurrent, requiring daily supplementation,  Likely secondary to hctz and decreased po intake. continue daily supplements given recent episode of bradycardia .  hctz discontinued.   Bradycardia Caused by low mg and metoprolol.  Reducing dose  to 12.5 mg bid. Mg 2.0 currently    Updated Medication List Outpatient Encounter Prescriptions as of 05/31/2012  Medication Sig Dispense Refill  . albuterol (PROVENTIL HFA;VENTOLIN HFA) 108 (90 BASE) MCG/ACT inhaler Inhale 2 puffs into the lungs every 6 (six) hours as needed for wheezing.  1 Inhaler  2  . aspirin 81 MG tablet Take 81 mg by mouth daily.        Marland Kitchen atorvastatin (LIPITOR) 80 MG tablet Take 1 tablet (80 mg total) by mouth daily.  90 tablet  1  . clonazePAM (KLONOPIN) 0.5 MG tablet Take 1 tablet (0.5 mg total) by mouth 2 (two) times  daily as needed.  60 tablet  2  . dicyclomine (BENTYL) 20 MG tablet Take 20 mg by mouth 2 (two) times daily.      Marland Kitchen estradiol (ESTRACE) 0.1 MG/GM vaginal cream Place 2 g vaginally daily.      Marland Kitchen gabapentin (NEURONTIN) 100 MG capsule Take 1 capsule (100 mg total) by mouth 3 (three) times daily.  90 capsule  11  . magnesium oxide (MAG-OX) 400 (241.3 MG) MG tablet Take 1 tablet (400 mg total) by mouth 2 (two) times daily.  60 tablet  6  . nitroGLYCERIN (NITROSTAT) 0.4 MG SL tablet Place 0.4 mg under the tongue every 5 (five) minutes as needed. As needed for chest  pain       . omeprazole (PRILOSEC) 40 MG capsule Take 1 capsule (40 mg total) by mouth 2 (two) times daily.  60 capsule  5  . DISCONTD: metoprolol (LOPRESSOR) 50 MG tablet Take 1 tablet (50 mg total) by mouth 2 (two) times daily.  60 tablet  6  . benzonatate (TESSALON PERLES) 100 MG capsule Take 2 capsules (200 mg total) by mouth 3 (three) times daily as needed.  60 capsule  3  . chlorpheniramine-HYDROcodone (TUSSIONEX PENNKINETIC ER) 10-8 MG/5ML LQCR Take 5 mLs by mouth every 12 (twelve) hours as needed.  280 mL  1  . metoprolol tartrate (LOPRESSOR) 25 MG tablet Take 1 tablet (25 mg total) by mouth 2 (two) times daily.  180 tablet  3  . DISCONTD: Belladonna Alk-Phenobarbital (DONNATAL PO) Take 1 tablet by mouth 2 (two) times daily.        Marland Kitchen DISCONTD: chlorpheniramine-HYDROcodone (TUSSIONEX) 10-8 MG/5ML LQCR Take 5 mLs by mouth every 12 (twelve) hours as needed (cough).  240 mL  0  . DISCONTD: potassium chloride (K-DUR) 10 MEQ tablet Take 1 tablet (10 mEq total) by mouth 2 (two) times daily.  14 tablet  0     Orders Placed This Encounter  Procedures  . DG Ribs Unilateral Left  . CBC with Differential  . Basic metabolic panel  . Magnesium    No Follow-up on file.

## 2012-06-01 ENCOUNTER — Encounter: Payer: Self-pay | Admitting: Internal Medicine

## 2012-06-01 DIAGNOSIS — R001 Bradycardia, unspecified: Secondary | ICD-10-CM | POA: Insufficient documentation

## 2012-06-01 NOTE — Assessment & Plan Note (Signed)
Secondary to hypomagnesemia, which may be due to hctz vs poor appetite.

## 2012-06-01 NOTE — Assessment & Plan Note (Addendum)
Recurrent, requiring daily supplementation,  Likely secondary to hctz and decreased po intake. continue daily supplements given recent episode of bradycardia .  hctz discontinued.

## 2012-06-01 NOTE — Assessment & Plan Note (Signed)
She has been tobacco free for nearly 30 days .  Encouragement given,

## 2012-06-01 NOTE — Assessment & Plan Note (Signed)
Her wheezing has resolved but she continues to have a nagging cough,  I have ordered unilateral rib films because of her tenderness to palpation over lower ribs on left.

## 2012-06-01 NOTE — Assessment & Plan Note (Signed)
Caused by low mg and metoprolol.  Reducing dose  to 12.5 mg bid. Mg 2.0 currently

## 2012-06-03 ENCOUNTER — Other Ambulatory Visit: Payer: Medicare Other

## 2012-07-02 ENCOUNTER — Ambulatory Visit (INDEPENDENT_AMBULATORY_CARE_PROVIDER_SITE_OTHER)
Admission: RE | Admit: 2012-07-02 | Discharge: 2012-07-02 | Disposition: A | Discharge status: Home / Self Care | Payer: Medicare Other | Source: Ambulatory Visit | Attending: Internal Medicine | Admitting: Internal Medicine

## 2012-07-02 ENCOUNTER — Encounter: Payer: Self-pay | Admitting: Internal Medicine

## 2012-07-02 ENCOUNTER — Ambulatory Visit (INDEPENDENT_AMBULATORY_CARE_PROVIDER_SITE_OTHER): Payer: Medicare Other | Admitting: Internal Medicine

## 2012-07-02 VITALS — BP 110/72 | HR 64 | Temp 98.6°F | Resp 16 | Wt 154.5 lb

## 2012-07-02 DIAGNOSIS — R079 Chest pain, unspecified: Secondary | ICD-10-CM | POA: Diagnosis not present

## 2012-07-02 DIAGNOSIS — I714 Abdominal aortic aneurysm, without rupture: Secondary | ICD-10-CM | POA: Diagnosis not present

## 2012-07-02 DIAGNOSIS — R0781 Pleurodynia: Secondary | ICD-10-CM

## 2012-07-02 DIAGNOSIS — Z87891 Personal history of nicotine dependence: Secondary | ICD-10-CM

## 2012-07-02 DIAGNOSIS — R059 Cough, unspecified: Secondary | ICD-10-CM

## 2012-07-02 DIAGNOSIS — R05 Cough: Secondary | ICD-10-CM | POA: Diagnosis not present

## 2012-07-02 DIAGNOSIS — E612 Magnesium deficiency: Secondary | ICD-10-CM

## 2012-07-02 DIAGNOSIS — E538 Deficiency of other specified B group vitamins: Secondary | ICD-10-CM | POA: Diagnosis not present

## 2012-07-02 DIAGNOSIS — R5381 Other malaise: Secondary | ICD-10-CM

## 2012-07-02 DIAGNOSIS — R5383 Other fatigue: Secondary | ICD-10-CM | POA: Diagnosis not present

## 2012-07-02 DIAGNOSIS — I10 Essential (primary) hypertension: Secondary | ICD-10-CM | POA: Diagnosis not present

## 2012-07-02 DIAGNOSIS — R0789 Other chest pain: Secondary | ICD-10-CM

## 2012-07-02 MED ORDER — HYDROCODONE-ACETAMINOPHEN 7.5-300 MG PO TABS: 1.0000 | ORAL_TABLET | Freq: Four times a day (QID) | ORAL | Status: DC | PRN

## 2012-07-02 MED ORDER — BENZONATATE 200 MG PO CAPS: 200.0000 mg | ORAL_CAPSULE | Freq: Three times a day (TID) | ORAL | Status: DC | PRN

## 2012-07-02 NOTE — Progress Notes (Signed)
Patient ID: Sabrina Montoya, female   DOB: June 13, 1941, 71 y.o.   MRN: [id number]  Patient Active Problem List  Diagnosis  . Osteopenia  . Vitamin D deficiency  . Hyperlipidemia  . Tobacco abuse counseling  . Aneurysm of abdominal aorta  . Carotid artery stenosis, asymptomatic  . Arthritis  . CAD (coronary artery disease), autologous vein bypass graft  . Peripheral vascular disease  . Hypokalemia  . Hypomagnesemia  . Bradycardia  . Cough  . History of tobacco abuse    Subjective:  CC:   Chief Complaint  Patient presents with  . Cough    HPI:   Sabrina Montoya a 71 y.o. female who presents Nagging dry cough since last week ,  Throat tickling causing spasms of cough ,  Malaise for the past week.  Still having left rib pain  But never had the  xrays that were ordered last month during //COPD exacerbation She has been cigarette free for 3 months.  2) magnesium deficiency.  She is taking magnesium once daily since her lst ER visit for low magnesium   Past Medical History  Diagnosis Date  . Diverticulitis of colon   . Rheumatic fever     possible at age 10  . Tobacco abuse   . Hyperlipidemia   . Abdominal aortic aneurysm without mention of rupture     infrarenal, stable, folllowed by Festus Barren  . CAD (coronary artery disease), autologous vein bypass graft   . Peripheral vascular disease     s/p CEA   . Tobacco abuse     Past Surgical History  Procedure Date  . Closed manipulation shoulder     left shoulder post physical therapy, Right shoulder redo (toggle bolts)  . Rotator cuff repair 2002    right shoulder Dr. Vernell Leep, [facility] in [place]  . Cea     Carotid stenosis found during workup for dysphagia,  [facility]  . Lumbar disc surgery 1971    L5, unssuccessful, fusion in 1983 successful (2 lumbar)  . Abdominal surgery 1976    for pain secondary to scar tissue, s/p apply  . 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  Hernia repair may 2011    Dr. Michela Pitcher  . Coronary artery bypass graft 2006    s/p triple bypass surgery, [facility]  . Cholecystectomy 2002    Dr. Michela Pitcher  . Coronary angiography 01/2011    one occluded artery with collateralization, other arteries patent  . Hernia repair   . Appendectomy 1974    The following portions of the patient's history were reviewed and updated as appropriate: Allergies, current medications, and problem list.    Review of Systems:   A comprehensive ROS was done and positive for cough and rib pain .   The rest was negative.   History   Social History  . Marital Status: Married    Spouse Name: N/A    Number of Children: N/A  . Years of Education: N/A   Occupational History  . Not on file.   Social History Main Topics  . Smoking status: Former Smoker -- 0.5 packs/day for 30 years    Types: Cigarettes    Quit date: 04/01/2012  . Smokeless tobacco: Never Used   Comment: quit for 2 years after sinus infection and 6 months after heart surgery  . Alcohol Use: No  . Drug Use: No  . Sexually Active: Not on file  Other Topics Concern  . Not on file   Social History Narrative   Lives with spouse Dorinda Hill    Objective:  BP 110/72  Pulse 64  Temp 98.6 F (37 C) (Oral)  Resp 16  Wt 154 lb 8 oz (70.081 kg)  SpO2 98%  General appearance: alert, cooperative and appears stated age Ears: normal TM's and external ear canals both ears Throat: lips, mucosa, and tongue normal; teeth and gums normal Neck: no adenopathy, no carotid bruit, supple, symmetrical, trachea midline and thyroid not enlarged, symmetric, no tenderness/mass/nodules Back: symmetric, no curvature. ROM normal. No CVA tenderness. Lungs: clear to auscultation bilaterally Heart: regular rate and rhythm, S1, S2 normal, no murmur, click, rub or gallop Abdomen: soft, non-tender; bowel sounds normal; no masses,  no organomegaly Pulses: 2+ and symmetric Skin: Skin color, texture, turgor  normal. No rashes or lesions Lymph nodes: Cervical, supraclavicular, and axillary nodes normal.  Assessment and Plan:  Cough She has no wheezing on exam but has a persistent cough.  Plain films were negative for infiltrate, and unllateral rib films were negative gor fracture. .  Symbicort and vicodin.  If no improvement in 2-3 weeks,  CT chest.   Hypomagnesemia Etiology still unclear ,but recurrence with discontinuation of supplements mandates continued supplementation  History of tobacco abuse She quit smoking in May 2013   Updated Medication List Outpatient Encounter Prescriptions as of 07/02/2012  Medication Sig Dispense Refill  . albuterol (PROVENTIL HFA;VENTOLIN HFA) 108 (90 BASE) MCG/ACT inhaler Inhale 2 puffs into the lungs every 6 (six) hours as needed for wheezing.  1 Inhaler  2  . aspirin 81 MG tablet Take 81 mg by mouth daily.        Marland Kitchen atorvastatin (LIPITOR) 80 MG tablet Take 1 tablet (80 mg total) by mouth daily.  90 tablet  1  . benzonatate (TESSALON PERLES) 200 MG capsule Take 1 capsule (200 mg total) by mouth 3 (three) times daily as needed.  90 capsule  3  . chlorpheniramine-HYDROcodone (TUSSIONEX PENNKINETIC ER) 10-8 MG/5ML LQCR Take 5 mLs by mouth every 12 (twelve) hours as needed.  280 mL  1  . cholecalciferol (VITAMIN D) 1000 UNITS tablet Take 1,000 Units by mouth daily.      . clonazePAM (KLONOPIN) 0.5 MG tablet Take 1 tablet (0.5 mg total) by mouth 2 (two) times daily as needed.  60 tablet  2  . dicyclomine (BENTYL) 20 MG tablet Take 20 mg by mouth 2 (two) times daily.      Marland Kitchen gabapentin (NEURONTIN) 100 MG capsule Take 1 capsule (100 mg total) by mouth 3 (three) times daily.  90 capsule  11  . magnesium oxide (MAG-OX) 400 (241.3 MG) MG tablet Take 400 mg by mouth daily.      . metoprolol tartrate (LOPRESSOR) 25 MG tablet Take 1 tablet (25 mg total) by mouth 2 (two) times daily.  180 tablet  3  . nitroGLYCERIN (NITROSTAT) 0.4 MG SL tablet Place 0.4 mg under the tongue  every 5 (five) minutes as needed. As needed for chest pain       . omeprazole (PRILOSEC) 40 MG capsule Take 1 capsule (40 mg total) by mouth 2 (two) times daily.  60 capsule  5  . DISCONTD: benzonatate (TESSALON PERLES) 100 MG capsule Take 2 capsules (200 mg total) by mouth 3 (three) times daily as needed.  60 capsule  3  . DISCONTD: magnesium oxide (MAG-OX) 400 (241.3 MG) MG tablet Take 1 tablet (400 mg total)  by mouth 2 (two) times daily.  60 tablet  6  . estradiol (ESTRACE) 0.1 MG/GM vaginal cream Place 2 g vaginally daily.      . Hydrocodone-Acetaminophen (VICODIN ES) 7.5-300 MG TABS Take 1 tablet by mouth every 6 (six) hours as needed.  180 each  0     Orders Placed This Encounter  Procedures  . DG Chest 2 View  . DG Ribs Unilateral Left  . HM MAMMOGRAPHY  . Vitamin B12  . Magnesium  . CBC with Differential    No Follow-up on file.

## 2012-07-02 NOTE — Patient Instructions (Addendum)
I an prescribing synbicort 2 puffs twice daily to quite down the rritation  i called in vicodin to use as a cough suppressant.    Please go get the x rays at [facility]  Continue the magnesium supplements

## 2012-07-03 ENCOUNTER — Encounter: Payer: Self-pay | Admitting: Internal Medicine

## 2012-07-03 ENCOUNTER — Telehealth: Payer: Self-pay | Admitting: Internal Medicine

## 2012-07-03 DIAGNOSIS — Z1239 Encounter for other screening for malignant neoplasm of breast: Secondary | ICD-10-CM

## 2012-07-03 DIAGNOSIS — R05 Cough: Secondary | ICD-10-CM | POA: Insufficient documentation

## 2012-07-03 DIAGNOSIS — Z87891 Personal history of nicotine dependence: Secondary | ICD-10-CM | POA: Insufficient documentation

## 2012-07-03 DIAGNOSIS — R059 Cough, unspecified: Secondary | ICD-10-CM | POA: Insufficient documentation

## 2012-07-03 LAB — CBC WITH DIFFERENTIAL/PLATELET
Basophils Absolute: 0.1 10*3/uL (ref 0.0–0.1)
Eosinophils Relative: 3.9 % (ref 0.0–5.0)
HCT: 32.7 % — ABNORMAL LOW (ref 36.0–46.0)
Hemoglobin: 11 g/dL — ABNORMAL LOW (ref 12.0–15.0)
Lymphs Abs: 2.1 10*3/uL (ref 0.7–4.0)
Monocytes Relative: 8.3 % (ref 3.0–12.0)
Neutro Abs: 1.9 10*3/uL (ref 1.4–7.7)
RDW: 13.5 % (ref 11.5–14.6)

## 2012-07-03 LAB — MAGNESIUM: Magnesium: 1.9 mg/dL (ref 1.5–2.5)

## 2012-07-03 NOTE — Assessment & Plan Note (Signed)
She has no wheezing on exam but has a persistent cough.  Plain films were negative for infiltrate, and unllateral rib films were negative gor fracture. .  Symbicort and vicodin.  If no improvement in 2-3 weeks,  CT chest.

## 2012-07-03 NOTE — Assessment & Plan Note (Signed)
She quit smoking in May 2013

## 2012-07-03 NOTE — Telephone Encounter (Signed)
.   X-rays did not show any or pneumonia. They did note some calcifications in her left breast. I noticed that her last mammogram was April 2012 at that [facility]. I would like her to get a mammogram at the [facility] cross a street from the hospital soon as possible.

## 2012-07-03 NOTE — Assessment & Plan Note (Signed)
Etiology still unclear ,but recurrence with discontinuation of supplements mandates continued supplementation

## 2012-07-03 NOTE — Telephone Encounter (Signed)
Patient notified

## 2012-07-04 ENCOUNTER — Telehealth: Payer: Self-pay | Admitting: Internal Medicine

## 2012-07-04 MED ORDER — HYDROCODONE-ACETAMINOPHEN 5-325 MG PO TABS: 1.0000 | ORAL_TABLET | Freq: Four times a day (QID) | ORAL | Status: AC | PRN

## 2012-07-04 NOTE — Telephone Encounter (Signed)
[facility] called and stated the vicodin that was prescribed is not covered by insurance.  She stated the more common tylenol combinations should be covered like the 325 or 500 mg combinations.  Please advise.

## 2012-07-04 NOTE — Telephone Encounter (Signed)
New Rx sent in

## 2012-07-09 ENCOUNTER — Telehealth: Payer: Self-pay | Admitting: Internal Medicine

## 2012-07-09 NOTE — Telephone Encounter (Signed)
Called and spoke with patient, she had questions regarding the xray, she wanted to know what Dr. Darrick Huntsman saw. I explained that there were calcifications seen and since her last mam was done on [place], Dr Darrick Huntsman [facility].

## 2012-07-09 NOTE — Telephone Encounter (Signed)
Patient needing a call from the nurse about her Ct scan and x-ray report she has questions.

## 2012-07-18 DIAGNOSIS — M5137 Other intervertebral disc degeneration, lumbosacral region: Secondary | ICD-10-CM | POA: Diagnosis not present

## 2012-07-22 DIAGNOSIS — Z1231 Encounter for screening mammogram for malignant neoplasm of breast: Secondary | ICD-10-CM | POA: Diagnosis not present

## 2012-07-22 DIAGNOSIS — N644 Mastodynia: Secondary | ICD-10-CM | POA: Diagnosis not present

## 2012-07-22 DIAGNOSIS — Z803 Family history of malignant neoplasm of breast: Secondary | ICD-10-CM | POA: Diagnosis not present

## 2012-07-28 ENCOUNTER — Encounter: Payer: Self-pay | Admitting: Internal Medicine

## 2012-08-14 ENCOUNTER — Encounter: Payer: Self-pay | Admitting: Internal Medicine

## 2012-09-27 ENCOUNTER — Other Ambulatory Visit: Payer: Self-pay | Admitting: Internal Medicine

## 2012-09-27 DIAGNOSIS — Z780 Asymptomatic menopausal state: Secondary | ICD-10-CM

## 2012-09-27 MED ORDER — ESTRADIOL 0.1 MG/GM VA CREA: TOPICAL_CREAM | VAGINAL | Status: DC

## 2012-10-01 ENCOUNTER — Other Ambulatory Visit: Payer: Self-pay

## 2012-10-01 MED ORDER — OMEPRAZOLE 40 MG PO CPDR: 40.0000 mg | DELAYED_RELEASE_CAPSULE | Freq: Two times a day (BID) | ORAL | Status: DC

## 2012-10-01 NOTE — Telephone Encounter (Signed)
Refill request for Omeprazole 40 mg #60 5 R sent electronic to [facility]

## 2012-10-31 ENCOUNTER — Telehealth: Payer: Self-pay | Admitting: Internal Medicine

## 2012-10-31 DIAGNOSIS — F419 Anxiety disorder, unspecified: Secondary | ICD-10-CM

## 2012-10-31 MED ORDER — CLONAZEPAM 0.5 MG PO TABS: 0.5000 mg | ORAL_TABLET | Freq: Two times a day (BID) | ORAL | Status: DC | PRN

## 2012-10-31 NOTE — Telephone Encounter (Signed)
Refill request for Clonazepam 0.5mg  tab #60 Take one tablet by mouth twice daily as needed. Last written 03/12/12  [facility] Fax [phone] Phone [phone]

## 2012-10-31 NOTE — Telephone Encounter (Signed)
Med filled.  

## 2012-11-07 ENCOUNTER — Other Ambulatory Visit: Payer: Self-pay | Admitting: *Deleted

## 2012-11-07 DIAGNOSIS — R040 Epistaxis: Secondary | ICD-10-CM | POA: Diagnosis not present

## 2012-11-07 DIAGNOSIS — J018 Other acute sinusitis: Secondary | ICD-10-CM | POA: Diagnosis not present

## 2012-11-11 DIAGNOSIS — L821 Other seborrheic keratosis: Secondary | ICD-10-CM | POA: Diagnosis not present

## 2012-11-11 DIAGNOSIS — L570 Actinic keratosis: Secondary | ICD-10-CM | POA: Diagnosis not present

## 2012-11-11 DIAGNOSIS — L820 Inflamed seborrheic keratosis: Secondary | ICD-10-CM | POA: Diagnosis not present

## 2012-11-11 DIAGNOSIS — Z0189 Encounter for other specified special examinations: Secondary | ICD-10-CM | POA: Diagnosis not present

## 2012-11-21 DIAGNOSIS — J018 Other acute sinusitis: Secondary | ICD-10-CM | POA: Diagnosis not present

## 2012-11-21 DIAGNOSIS — M25559 Pain in unspecified hip: Secondary | ICD-10-CM | POA: Diagnosis not present

## 2012-11-21 DIAGNOSIS — K5732 Diverticulitis of large intestine without perforation or abscess without bleeding: Secondary | ICD-10-CM | POA: Diagnosis not present

## 2012-11-27 DIAGNOSIS — J029 Acute pharyngitis, unspecified: Secondary | ICD-10-CM | POA: Diagnosis not present

## 2012-12-03 DIAGNOSIS — R040 Epistaxis: Secondary | ICD-10-CM | POA: Diagnosis not present

## 2012-12-03 DIAGNOSIS — J018 Other acute sinusitis: Secondary | ICD-10-CM | POA: Diagnosis not present

## 2012-12-27 ENCOUNTER — Encounter: Payer: Self-pay | Admitting: General Practice

## 2012-12-27 ENCOUNTER — Ambulatory Visit (INDEPENDENT_AMBULATORY_CARE_PROVIDER_SITE_OTHER): Payer: Medicare Other | Admitting: Internal Medicine

## 2012-12-27 ENCOUNTER — Encounter: Payer: Self-pay | Admitting: Internal Medicine

## 2012-12-27 VITALS — BP 110/64 | HR 63 | Temp 98.8°F | Resp 16 | Wt 163.2 lb

## 2012-12-27 DIAGNOSIS — B3781 Candidal esophagitis: Secondary | ICD-10-CM

## 2012-12-27 DIAGNOSIS — R1031 Right lower quadrant pain: Secondary | ICD-10-CM | POA: Diagnosis not present

## 2012-12-27 DIAGNOSIS — Z1239 Encounter for other screening for malignant neoplasm of breast: Secondary | ICD-10-CM | POA: Diagnosis not present

## 2012-12-27 DIAGNOSIS — E785 Hyperlipidemia, unspecified: Secondary | ICD-10-CM

## 2012-12-27 LAB — POCT URINALYSIS DIPSTICK
Bilirubin, UA: NEGATIVE
Glucose, UA: NEGATIVE
Ketones, UA: NEGATIVE
Protein, UA: NEGATIVE
Spec Grav, UA: 1.02

## 2012-12-27 MED ORDER — NYSTATIN 100000 UNIT/ML MT SUSP: 500000.0000 [IU] | Freq: Four times a day (QID) | OROMUCOSAL | Status: DC

## 2012-12-27 NOTE — Patient Instructions (Addendum)
Continue the 40 mg dose of lipitor and we will repeat cholesterol in 6 to 8 weeks  Ok to the increase your Klonopin to a full tablet up to twice day if needed for anxiety   We will check your magnesium and potassium levels today,  But continue the magnesium supplement until you hear from me.   You had your flu shot in October.   We are checking your urine and some markers of inflammation   Go to [facility] and get a selection of Atkins Bars to eat instead of junk food

## 2012-12-27 NOTE — Progress Notes (Signed)
Patient ID: Sabrina Montoya, female   DOB: 1941-03-23, 72 y.o.   MRN: [id number]   Patient Active Problem List  Diagnosis  . Osteopenia  . Vitamin D deficiency  . Hyperlipidemia  . Tobacco abuse counseling  . Aneurysm of abdominal aorta  . Carotid artery stenosis, asymptomatic  . Arthritis  . CAD (coronary artery disease), autologous vein bypass graft  . Peripheral vascular disease  . Hypokalemia  . Hypomagnesemia  . Bradycardia  . History of tobacco abuse  . Esophageal thrush  . Abdominal pain, RLQ    Subjective:  CC:   Chief Complaint  Patient presents with  . Follow-up    Discuss Cholesterol    HPI:   Sabrina Montoya a 72 y.o. female who presents foe evaluation of multiple issues including Anxiety, insomnia, and unresolved RLQ pain. She is having a difficult time  due to her husband's recent diagnosis of glioblastoma and frequent medical evaluations for treatment complicated by thrombocytopenia which has not improved and is delaying initiation of chemotherapy. She is eating out of vending machines a lot and has gained weight.   She was treated recently for sinusitis by ENT [facility].  She was then seen by Urgent Care for RLQ pain with no change in bowel movements and no fevers, nausea or vomiting..  She received a prednisone taper, Flagyl and cipro for unclear reasons for presumed diverticulitis, although records are unavailable and patient is a difficult historian.  She discontinued all medications after 5 days because she developed thrush symptoms.  The thrush was treated partially by meds borrowed from her husband (he apparently had some nystatin) but she states that the prior medications still "burned my throat."    Past Medical History  Diagnosis Date  . Diverticulitis of colon   . Rheumatic fever     possible at age 12  . Tobacco abuse   . Hyperlipidemia   . Abdominal aortic aneurysm without mention of rupture     infrarenal, stable, folllowed by Festus Barren  . CAD  (coronary artery disease), autologous vein bypass graft   . Peripheral vascular disease     s/p CEA   . Tobacco abuse     Past Surgical History  Procedure Laterality Date  . Closed manipulation shoulder      left shoulder post physical therapy, Right shoulder redo (toggle bolts)  . Rotator cuff repair  2002    right shoulder Dr. Vernell Leep, [facility] in [place]  . Cea      Carotid stenosis found during workup for dysphagia,  [facility]  . Lumbar disc surgery  1971    L5, unssuccessful, fusion in 1983 successful (2 lumbar)  . Abdominal surgery  1976    for pain secondary to scar tissue, s/p apply  . 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 Hernia repair  may 2011    Dr. Michela Pitcher  . Coronary artery bypass graft  2006    s/p triple bypass surgery, [facility]  . Cholecystectomy  2002    Dr. Michela Pitcher  . Coronary angiography  01/2011    one occluded artery with collateralization, other arteries patent  . Hernia repair    . Appendectomy  1974    The following portions of the patient's history were reviewed and updated as appropriate: Allergies, current medications, and problem list.    Review of Systems:   12 Pt  review of systems was negative except those addressed  in the HPI,     History   Social History  . Marital Status: Married    Spouse Name: N/A    Number of Children: N/A  . Years of Education: N/A   Occupational History  . Not on file.   Social History Main Topics  . Smoking status: Former Smoker -- 0.50 packs/day for 30 years    Types: Cigarettes    Quit date: 04/01/2012  . Smokeless tobacco: Never Used     Comment: quit for 2 years after sinus infection and 6 months after heart surgery  . Alcohol Use: No  . Drug Use: No  . Sexually Active: Not on file   Other Topics Concern  . Not on file   Social History Narrative   Lives with spouse Dorinda Hill    Objective:  BP 110/64  Pulse 63  Temp(Src)  98.8 F (37.1 C) (Oral)  Resp 16  Wt 163 lb 4 oz (74.05 kg)  BMI 28.01 kg/m2  SpO2 97%  General appearance: alert, cooperative and appears stated age Ears: normal TM's and external ear canals both ears Throat: lips, mucosa, and tongue normal; teeth and gums normal Neck: no adenopathy, no carotid bruit, supple, symmetrical, trachea midline and thyroid not enlarged, symmetric, no tenderness/mass/nodules Back: symmetric, no curvature. ROM normal. No CVA tenderness. Lungs: clear to auscultation bilaterally Heart: regular rate and rhythm, S1, S2 normal, no murmur, click, rub or gallop Abdomen: soft, non-tender; bowel sounds normal; no masses,  no organomegaly Pulses: 2+ and symmetric Skin: Skin color, texture, turgor normal. No rashes or lesions Lymph nodes: Cervical, supraclavicular, and axillary nodes normal.  Assessment and Plan:  Esophageal thrush None visible in mouth but given patient's recent use of prednisone and abx and her reports of esophageal pain, will treat empirically with nystatin solution,.   Abdominal pain, RLQ Etiology and chronicity unclear.  Patient has no signs of UTI or diverticulitis by history and evaluation of urinalysis.  Without N/V/Dand failure of symptoms to resolve empirictreatment of diverticultis I am hesitant to order a CT or continue to treat with more medication, ,  She has normal BS and normal bowel movements currently. Will await labs to help guide workup for inflammation vs pelvic mass vs constipation     Hypomagnesemia Has been taking oral supplements since June due to recurrence.,  Repeat Mg is pending   Hyperlipidemia She is due for LFTs due to statin therapy .  Dose clarified,  40 mg lipitor for now.    A total of 30 minutes was spent with patient more than half of which was spent in counseling, reviewing records from other prviders and coordination of care. Updated Medication List Outpatient Encounter Prescriptions as of 12/27/2012   Medication Sig Dispense Refill  . aspirin 81 MG tablet Take 81 mg by mouth daily.        . cholecalciferol (VITAMIN D) 1000 UNITS tablet Take 1,000 Units by mouth daily.      . clonazePAM (KLONOPIN) 0.5 MG tablet Take 1 tablet (0.5 mg total) by mouth 2 (two) times daily as needed.  60 tablet  2  . dicyclomine (BENTYL) 20 MG tablet Take 20 mg by mouth 2 (two) times daily.      Marland Kitchen gabapentin (NEURONTIN) 100 MG capsule Take 1 capsule (100 mg total) by mouth 3 (three) times daily.  90 capsule  11  . magnesium oxide (MAG-OX) 400 (241.3 MG) MG tablet Take 400 mg by mouth daily.      . metoprolol  tartrate (LOPRESSOR) 25 MG tablet Take 1 tablet (25 mg total) by mouth 2 (two) times daily.  180 tablet  3  . nitroGLYCERIN (NITROSTAT) 0.4 MG SL tablet Place 0.4 mg under the tongue every 5 (five) minutes as needed. As needed for chest pain       . omeprazole (PRILOSEC) 40 MG capsule Take 1 capsule (40 mg total) by mouth 2 (two) times daily.  60 capsule  5  . albuterol (PROVENTIL HFA;VENTOLIN HFA) 108 (90 BASE) MCG/ACT inhaler Inhale 2 puffs into the lungs every 6 (six) hours as needed for wheezing.  1 Inhaler  2  . atorvastatin (LIPITOR) 80 MG tablet Take 1 tablet (80 mg total) by mouth daily.  90 tablet  1  . benzonatate (TESSALON PERLES) 200 MG capsule Take 1 capsule (200 mg total) by mouth 3 (three) times daily as needed.  90 capsule  3  . estradiol (ESTRACE) 0.1 MG/GM vaginal cream Use nightly for two weeks,  Then twice weekly thereafter  42.5 g  4  . nystatin (MYCOSTATIN) 100000 UNIT/ML suspension Take 5 mLs (500,000 Units total) by mouth 4 (four) times daily.  120 mL  0  . [DISCONTINUED] chlorpheniramine-HYDROcodone (TUSSIONEX PENNKINETIC ER) 10-8 MG/5ML LQCR Take 5 mLs by mouth every 12 (twelve) hours as needed.  280 mL  1   No facility-administered encounter medications on file as of 12/27/2012.

## 2012-12-29 ENCOUNTER — Encounter: Payer: Self-pay | Admitting: Internal Medicine

## 2012-12-29 DIAGNOSIS — K2100 Gastro-esophageal reflux disease with esophagitis, without bleeding: Secondary | ICD-10-CM | POA: Insufficient documentation

## 2012-12-29 DIAGNOSIS — M25551 Pain in right hip: Secondary | ICD-10-CM | POA: Insufficient documentation

## 2012-12-29 LAB — URINE CULTURE: Organism ID, Bacteria: NO GROWTH

## 2012-12-29 NOTE — Assessment & Plan Note (Signed)
Has been taking oral supplements since June due to recurrence.,  Repeat Mg is pending

## 2012-12-29 NOTE — Assessment & Plan Note (Signed)
Etiology and chronicity unclear.  Patient has no signs of UTI or diverticulitis by history and evaluation of urinalysis.  Without N/V/Dand failure of symptoms to resolve empirictreatment of diverticultis I am hesitant to order a CT or continue to treat with more medication, ,  She has normal BS and normal bowel movements currently. Will await labs to help guide workup for inflammation vs pelvic mass vs constipation

## 2012-12-29 NOTE — Assessment & Plan Note (Addendum)
She is due for LFTs due to statin therapy .  Dose clarified,  40 mg lipitor for now.

## 2012-12-29 NOTE — Assessment & Plan Note (Signed)
None visible in mouth but given patient's recent use of prednisone and abx and her reports of esophageal pain, will treat empirically with nystatin solution,.

## 2012-12-30 ENCOUNTER — Other Ambulatory Visit (INDEPENDENT_AMBULATORY_CARE_PROVIDER_SITE_OTHER): Payer: Medicare Other

## 2012-12-30 LAB — COMPREHENSIVE METABOLIC PANEL
ALT: 28 U/L (ref 0–35)
AST: 44 U/L — ABNORMAL HIGH (ref 0–37)
CO2: 27 mEq/L (ref 19–32)
Calcium: 9.2 mg/dL (ref 8.4–10.5)
Chloride: 104 mEq/L (ref 96–112)
Creatinine, Ser: 1.1 mg/dL (ref 0.4–1.2)
GFR: 51.03 mL/min — ABNORMAL LOW (ref >60.00)
Sodium: 141 mEq/L (ref 135–145)
Total Bilirubin: 0.6 mg/dL (ref 0.3–1.2)
Total Protein: 7 g/dL (ref 6.0–8.3)

## 2012-12-30 LAB — CBC WITH DIFFERENTIAL/PLATELET
Basophils Relative: 0.6 % (ref 0.0–3.0)
Eosinophils Relative: 2.9 % (ref 0.0–5.0)
HCT: 32.2 % — ABNORMAL LOW (ref 36.0–46.0)
Hemoglobin: 11.1 g/dL — ABNORMAL LOW (ref 12.0–15.0)
Lymphs Abs: 1.6 10*3/uL (ref 0.7–4.0)
MCV: 85 fl (ref 78.0–100.0)
Monocytes Absolute: 0.4 10*3/uL (ref 0.1–1.0)
Monocytes Relative: 9.2 % (ref 3.0–12.0)
RBC: 3.79 Mil/uL — ABNORMAL LOW (ref 3.87–5.11)
WBC: 4 10*3/uL — ABNORMAL LOW (ref 4.5–10.5)

## 2012-12-30 LAB — SEDIMENTATION RATE: Sed Rate: 21 mm/hr (ref 0–22)

## 2012-12-30 LAB — MAGNESIUM: Magnesium: 1.8 mg/dL (ref 1.5–2.5)

## 2013-01-16 DIAGNOSIS — H2510 Age-related nuclear cataract, unspecified eye: Secondary | ICD-10-CM | POA: Diagnosis not present

## 2013-01-21 DIAGNOSIS — S79929A Unspecified injury of unspecified thigh, initial encounter: Secondary | ICD-10-CM | POA: Diagnosis not present

## 2013-01-21 DIAGNOSIS — S79919A Unspecified injury of unspecified hip, initial encounter: Secondary | ICD-10-CM | POA: Diagnosis not present

## 2013-01-28 DIAGNOSIS — I658 Occlusion and stenosis of other precerebral arteries: Secondary | ICD-10-CM | POA: Diagnosis not present

## 2013-01-28 DIAGNOSIS — I6529 Occlusion and stenosis of unspecified carotid artery: Secondary | ICD-10-CM | POA: Diagnosis not present

## 2013-02-14 DIAGNOSIS — I10 Essential (primary) hypertension: Secondary | ICD-10-CM | POA: Diagnosis not present

## 2013-02-14 DIAGNOSIS — I2510 Atherosclerotic heart disease of native coronary artery without angina pectoris: Secondary | ICD-10-CM | POA: Diagnosis not present

## 2013-02-26 ENCOUNTER — Other Ambulatory Visit: Payer: Self-pay | Admitting: Internal Medicine

## 2013-02-26 ENCOUNTER — Other Ambulatory Visit (INDEPENDENT_AMBULATORY_CARE_PROVIDER_SITE_OTHER): Payer: Medicare Other | Admitting: *Deleted

## 2013-02-26 DIAGNOSIS — R5381 Other malaise: Secondary | ICD-10-CM

## 2013-02-26 DIAGNOSIS — E785 Hyperlipidemia, unspecified: Secondary | ICD-10-CM

## 2013-02-26 DIAGNOSIS — R5383 Other fatigue: Secondary | ICD-10-CM

## 2013-02-26 LAB — COMPREHENSIVE METABOLIC PANEL
Albumin: 4.1 g/dL (ref 3.5–5.2)
BUN: 23 mg/dL (ref 6–23)
CO2: 28 mEq/L (ref 19–32)
Calcium: 9.3 mg/dL (ref 8.4–10.5)
Chloride: 101 mEq/L (ref 96–112)
Creatinine, Ser: 1 mg/dL (ref 0.4–1.2)
GFR: 56.18 mL/min — ABNORMAL LOW (ref >60.00)
Glucose, Bld: 110 mg/dL — ABNORMAL HIGH (ref 70–99)
Potassium: 4.5 mEq/L (ref 3.5–5.1)

## 2013-02-26 LAB — CBC WITH DIFFERENTIAL/PLATELET
Basophils Relative: 0.4 % (ref 0.0–3.0)
Eosinophils Relative: 3.7 % (ref 0.0–5.0)
Hemoglobin: 11.1 g/dL — ABNORMAL LOW (ref 12.0–15.0)
Lymphocytes Relative: 40.9 % (ref 12.0–46.0)
MCHC: 34.1 g/dL (ref 30.0–36.0)
Neutro Abs: 1.7 10*3/uL (ref 1.4–7.7)
Neutrophils Relative %: 45.5 % (ref 43.0–77.0)
RBC: 3.82 Mil/uL — ABNORMAL LOW (ref 3.87–5.11)
WBC: 3.7 10*3/uL — ABNORMAL LOW (ref 4.5–10.5)

## 2013-02-26 LAB — TSH: TSH: 1.1 u[IU]/mL (ref 0.35–5.50)

## 2013-02-26 LAB — LIPID PANEL
HDL: 29.7 mg/dL — ABNORMAL LOW (ref >39.00)
VLDL: 51.2 mg/dL — ABNORMAL HIGH (ref 0.0–40.0)

## 2013-02-26 LAB — LDL CHOLESTEROL, DIRECT: Direct LDL: 76.2 mg/dL

## 2013-02-26 LAB — MAGNESIUM: Magnesium: 1.8 mg/dL (ref 1.5–2.5)

## 2013-02-28 ENCOUNTER — Telehealth: Payer: Self-pay | Admitting: Internal Medicine

## 2013-03-03 DIAGNOSIS — G5622 Lesion of ulnar nerve, left upper limb: Secondary | ICD-10-CM | POA: Insufficient documentation

## 2013-03-03 DIAGNOSIS — I6523 Occlusion and stenosis of bilateral carotid arteries: Secondary | ICD-10-CM | POA: Insufficient documentation

## 2013-03-03 DIAGNOSIS — I6529 Occlusion and stenosis of unspecified carotid artery: Secondary | ICD-10-CM | POA: Diagnosis not present

## 2013-03-03 DIAGNOSIS — G5620 Lesion of ulnar nerve, unspecified upper limb: Secondary | ICD-10-CM | POA: Diagnosis not present

## 2013-03-03 DIAGNOSIS — Z1231 Encounter for screening mammogram for malignant neoplasm of breast: Secondary | ICD-10-CM | POA: Diagnosis not present

## 2013-03-04 ENCOUNTER — Other Ambulatory Visit: Payer: Self-pay | Admitting: *Deleted

## 2013-03-04 MED ORDER — OMEPRAZOLE 40 MG PO CPDR: 40.0000 mg | DELAYED_RELEASE_CAPSULE | Freq: Two times a day (BID) | ORAL | Status: DC

## 2013-03-04 NOTE — Telephone Encounter (Signed)
Rx sent to pharmacy by escript  

## 2013-03-06 DIAGNOSIS — N812 Incomplete uterovaginal prolapse: Secondary | ICD-10-CM | POA: Diagnosis not present

## 2013-03-06 DIAGNOSIS — IMO0002 Reserved for concepts with insufficient information to code with codable children: Secondary | ICD-10-CM | POA: Diagnosis not present

## 2013-03-06 DIAGNOSIS — R159 Full incontinence of feces: Secondary | ICD-10-CM | POA: Diagnosis not present

## 2013-03-11 ENCOUNTER — Other Ambulatory Visit: Payer: Self-pay | Admitting: *Deleted

## 2013-03-11 DIAGNOSIS — F419 Anxiety disorder, unspecified: Secondary | ICD-10-CM

## 2013-03-11 MED ORDER — CLONAZEPAM 0.5 MG PO TABS: 0.5000 mg | ORAL_TABLET | Freq: Two times a day (BID) | ORAL | Status: DC | PRN

## 2013-03-11 MED ORDER — GABAPENTIN 100 MG PO CAPS: 100.0000 mg | ORAL_CAPSULE | Freq: Three times a day (TID) | ORAL | Status: DC

## 2013-03-11 NOTE — Telephone Encounter (Signed)
Ok to refill clonazepam and gabapentin,  Authorized in epic

## 2013-03-11 NOTE — Telephone Encounter (Signed)
Approval for gabbapentin and Klonopin refill.

## 2013-03-12 NOTE — Telephone Encounter (Signed)
Medication called to pharmacy. 

## 2013-03-14 ENCOUNTER — Ambulatory Visit (INDEPENDENT_AMBULATORY_CARE_PROVIDER_SITE_OTHER): Payer: Medicare Other | Admitting: Internal Medicine

## 2013-03-14 ENCOUNTER — Encounter: Payer: Self-pay | Admitting: Internal Medicine

## 2013-03-14 ENCOUNTER — Telehealth: Payer: Self-pay | Admitting: *Deleted

## 2013-03-14 VITALS — BP 128/68 | HR 82 | Temp 98.2°F | Resp 14 | Wt 164.5 lb

## 2013-03-14 DIAGNOSIS — F411 Generalized anxiety disorder: Secondary | ICD-10-CM

## 2013-03-14 DIAGNOSIS — E663 Overweight: Secondary | ICD-10-CM | POA: Diagnosis not present

## 2013-03-14 DIAGNOSIS — B3781 Candidal esophagitis: Secondary | ICD-10-CM | POA: Diagnosis not present

## 2013-03-14 DIAGNOSIS — Z6825 Body mass index (BMI) 25.0-25.9, adult: Secondary | ICD-10-CM

## 2013-03-14 MED ORDER — PHENTERMINE HCL 15 MG PO TBDP: 1.0000 | ORAL_TABLET | Freq: Every day | ORAL | Status: DC

## 2013-03-14 MED ORDER — FLUCONAZOLE 150 MG PO TABS: 150.0000 mg | ORAL_TABLET | Freq: Every day | ORAL | Status: DC

## 2013-03-14 MED ORDER — METOPROLOL TARTRATE 25 MG PO TABS: 12.5000 mg | ORAL_TABLET | Freq: Two times a day (BID) | ORAL | Status: DC

## 2013-03-14 NOTE — Patient Instructions (Addendum)
take the clonazepam as needed for anxiety or insomnia. It is habit forming and will cause withdrawal if you stop it cold [place])  This is  One version of a  "Low GI"  Diet:  It will still lower your blood sugars and allow you to lose 4 to 8  lbs  per month if you follow it carefully.  Your goal with exercise is a minimum of 30 minutes of aerobic exercise 5 days per week (Walking does not count once it becomes easy!)    All of the foods can be found at grocery stores and in bulk at [employer].  The Atkins protein bars and shakes are available in more varieties at Target, WalMart and [employer]'s Foods.     7 AM Breakfast:  Choose from the following:  Low carbohydrate Protein  Shakes (I recommend the EAS AdvantEdge "Carb Control" shakes  Or the low carb shakes by Atkins.    2.5 carbs   Arnold's "Sandwhich Thin"toasted  w/ peanut butter (no jelly: about 20 net carbs  "Bagel Thin" with cream cheese and salmon: about 20 carbs   a scrambled egg/bacon/cheese burrito made with Mission's "carb balance" whole wheat tortilla  (about 10 net carbs )   Avoid cereal and bananas, oatmeal and cream of wheat and grits. They are loaded with carbohydrates!   10 AM: high protein snack  Protein bar by Atkins (the snack size, under 200 cal, usually < 6 net carbs).    A stick of cheese:  Around 1 carb,  100 cal     Dannon Light n Fit [place] Yogurt  (80 cal, 8 carbs)  Other so called "protein bars" and Greek yogurts tend to be loaded with carbohydrates.  Remember, in food advertising, the word "energy" is synonymous for " carbohydrate."  Lunch:   A Sandwich using the bread choices listed, Can use any  Eggs,  lunchmeat, grilled meat or canned tuna), avocado, regular mayo/mustard  and cheese.  A Salad using blue cheese, ranch,  Goddess or vinagrette,  No croutons or "confetti" and no "candied nuts" but regular nuts OK.   No pretzels or chips.  Pickles and miniature sweet peppers are a good low carb alternative that provide a  "crunch"  The bread is the only source of carbohydrate in a sandwich and  can be decreased by trying some of these alternatives to traditional loaf bread  Joseph's makes a pita bread and a flat bread that are 50 cal and 4 net carbs available at BJs and WalMart.  This can be toasted to use with hummous as well  Toufayan makes a low carb flatbread that's 100 cal and 9 net carbs available at [employer] and [employer] makes 2 sizes of  Low carb whole wheat tortilla  (The large one is 210 cal and 6 net carbs) Avoid "Low fat dressings, as well as Reyne Dumas and [address] dressings They are loaded with sugar!   3 PM/ Mid day  Snack:  Consider  1 ounce of  almonds, walnuts, pistachios, pecans, peanuts,  Macadamia nuts or a nut medley.  Avoid "granola"; the dried cranberries and raisins are loaded with carbohydrates. Mixed nuts as long as there are no raisins,  cranberries or dried fruit.     6 PM  Dinner:     Meat/fowl/fish with a green salad, and either broccoli, cauliflower, green beans, spinach, brussel sprouts or  Lima beans. DO NOT BREAD THE PROTEIN!!      There  is a low carb pasta by Dreamfield's that is acceptable and tastes great: only 5 digestible carbs/serving.( All grocery stores but BJs carry it )  Try Kai Levins Angelo's chicken piccata or chicken or eggplant parm over low carb pasta.(Lowes and BJs)   Clifton Custard Sanchez's "Carnitas" (pulled pork, no sauce,  0 carbs) or his beef pot roast to make a dinner burrito (at BJ's)  Pesto over low carb pasta (bj's sells a good quality pesto in the center refrigerated section of the deli   Whole wheat pasta is still full of digestible carbs and  Not as low in glycemic index as Dreamfield's.   Brown rice is still rice,  So skip the rice and noodles if you eat [place] or [place] (or at least limit to 1/2 cup)  9 PM snack :   Breyer's "low carb" fudgsicle or  ice cream bar (Carb Smart line), or  Weight Watcher's ice cream bar , or another "no sugar added"  ice cream;  a serving of fresh berries/cherries with whipped cream   Cheese or DANNON'S LlGHT N FIT GREEK YOGURT  Avoid bananas, pineapple, grapes  and watermelon on a regular basis because they are high in sugar.  THINK OF THEM AS DESSERT  Remember that snack Substitutions should be less than 10 NET carbs per serving and meals < 20 carbs. Remember to subtract fiber grams to get the "net carbs."

## 2013-03-14 NOTE — Progress Notes (Signed)
Patient ID: Sabrina Montoya, female   DOB: 1941-04-01, 72 y.o.   MRN: [id number]  Patient Active Problem List   Diagnosis Date Noted  . Overweight (BMI 25.0-29.9) 03/16/2013  . Anxiety state, unspecified 03/16/2013  . Esophageal thrush 12/29/2012  . Abdominal pain, RLQ 12/29/2012  . History of tobacco abuse 07/03/2012  . Hypomagnesemia 06/01/2012  . Bradycardia 06/01/2012  . Hypokalemia 03/07/2012  . CAD (coronary artery disease), autologous vein bypass graft   . Peripheral vascular disease   . Arthritis 02/29/2012  . Aneurysm of abdominal aorta 01/05/2012  . Carotid artery stenosis, asymptomatic 01/05/2012  . Tobacco abuse counseling 08/30/2011  . Hyperlipidemia   . Osteopenia 08/29/2011  . Vitamin D deficiency 08/29/2011    Subjective:  CC:   Chief Complaint  Patient presents with  . Follow-up    blood work    HPI:   Sabrina Montoya a 72 y.o. female who presents with multiple acute complaints.  She is having persistent symptoms of esophagitis despite finishing treatment for thrush with oral suspension of nystatin 5 weeks ago.  Attributed the thrush to  a prednisone taper that was prescribed 6 weeks ago by Urgent Care for  Bronchitis.   Took the nystatin after that.  She was seen by her usual gastroenterologist but nothing was done despite her reporting that she was having persistent esophageal pain. She is taking omeprazole 40 mg daily and is not on a bisphophonate.  No records available.   2) Malaise, fatigue and weight gain.  Not exercising  bc of Sabrina Montoya's oncology schedule., and Sabrina Montoya is getting worse. Diet has been poor due to travel and time spent in hospitals over the past 6 months.    3) Bladder prolapse.  Patient  wearing a pessary placed by Dr. Felipa Evener several days ago as a trial on nonsurgical treatment given her pior history of ventral hernia repair with mesh.    Past Medical History  Diagnosis Date  . Diverticulitis of colon   . Rheumatic fever     possible at age 25  .  Tobacco abuse   . Hyperlipidemia   . Abdominal aortic aneurysm without mention of rupture     infrarenal, stable, folllowed by Festus Barren  . CAD (coronary artery disease), autologous vein bypass graft   . Peripheral vascular disease     s/p CEA   . Tobacco abuse     Past Surgical History  Procedure Laterality Date  . Closed manipulation shoulder      left shoulder post physical therapy, Right shoulder redo (toggle bolts)  . Rotator cuff repair  2002    right shoulder Dr. Vernell Leep, [facility] in [place]  . Cea      Carotid stenosis found during workup for dysphagia,  [facility]  . Lumbar disc surgery  1971    L5, unssuccessful, fusion in 1983 successful (2 lumbar)  . Abdominal surgery  1976    for pain secondary to scar tissue, s/p apply  . 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 Hernia repair  may 2011    Dr. Michela Pitcher  . Coronary artery bypass graft  2006    s/p triple bypass surgery, [facility]  . Cholecystectomy  2002    Dr. Michela Pitcher  . Coronary angiography  01/2011    one occluded artery with collateralization, other arteries patent  . Hernia repair    . Appendectomy  1974    The following portions  of the patient's history were reviewed and updated as appropriate: Allergies, current medications, and problem list.    Review of Systems:   Patient denies headache, fevers, malaise, unintentional weight loss, skin rash, eye pain, sinus congestion and sinus pain, sore throat, dysphagia,  hemoptysis , cough, dyspnea, wheezing, chest pain, palpitations, orthopnea, edema, abdominal pain, nausea, melena, diarrhea, constipation, flank pain, dysuria, hematuria, urinary  Frequency, nocturia, numbness, tingling, seizures,  Focal weakness, Loss of consciousness,  Tremor, insomnia, depression, anxiety, and suicidal ideation.     History   Social History  . Marital Status: Married    Spouse Name: N/A    Number of Children: N/A  .  Years of Education: N/A   Occupational History  . Not on file.   Social History Main Topics  . Smoking status: Former Smoker -- 0.50 packs/day for 30 years    Types: Cigarettes    Quit date: 04/01/2012  . Smokeless tobacco: Never Used     Comment: quit for 2 years after sinus infection and 6 months after heart surgery  . Alcohol Use: No  . Drug Use: No  . Sexually Active: Not on file   Other Topics Concern  . Not on file   Social History Narrative   Lives with spouse Sabrina Montoya    Objective:  BP 128/68  Pulse 82  Temp(Src) 98.2 F (36.8 C) (Oral)  Resp 14  Wt 164 lb 8 oz (74.617 kg)  BMI 28.22 kg/m2  SpO2 98%  General appearance: alert, cooperative and appears stated age Ears: normal TM's and external ear canals both ears Throat: lips, mucosa, and tongue normal; teeth and gums normal Neck: no adenopathy, no carotid bruit, supple, symmetrical, trachea midline and thyroid not enlarged, symmetric, no tenderness/mass/nodules Back: symmetric, no curvature. ROM normal. No CVA tenderness. Lungs: clear to auscultation bilaterally Heart: regular rate and rhythm, S1, S2 normal, no murmur, click, rub or gallop Abdomen: soft, non-tender; bowel sounds normal; no masses,  no organomegaly Pulses: 2+ and symmetric Skin: Skin color, texture, turgor normal. No rashes or lesions Lymph nodes: Cervical, supraclavicular, and axillary nodes normal.  Assessment and Plan:  Esophageal thrush She continues to have throat pain with swallowing despite completing 7 days of nystatin.  Oropharynx is clear. She is frustrated by her gastroenterologist's ignorance of her complaint and does not understand why he did not recommend endoscopy for persistent symptoms.  Given her long history of tobacco abuse I believe an EGD is warranted to investigate her symptoms of esophagitis. Oral fluconazole x two days.  Hold statin for a week. Continue PPI and tobacco abstinence.     Overweight (BMI 25.0-29.9) 16 lsb  gained in the last year,  , BMI now 28.  Trial of low dose phentermine discussed as patient has frustrated and unable to change her schedule currently due to husbands treatment schedule for glioblastoma .Adverse effects discussed.   Anxiety state, unspecified Managed with prn clonazepam, which she ran out of recently and suffered from mild withdrawal.  Risk of medication being used daily discussed.   A total of 40 minutes was spent with patient more than half of which was spent in counseling, reviewing records from other prviders and coordination of care. Updated Medication List Outpatient Encounter Prescriptions as of 03/14/2013  Medication Sig Dispense Refill  . albuterol (PROVENTIL HFA;VENTOLIN HFA) 108 (90 BASE) MCG/ACT inhaler Inhale 2 puffs into the lungs every 6 (six) hours as needed for wheezing.  1 Inhaler  2  . aspirin 81 MG tablet  Take 81 mg by mouth daily.        Marland Kitchen atorvastatin (LIPITOR) 80 MG tablet Take 1 tablet (80 mg total) by mouth daily.  90 tablet  1  . benzonatate (TESSALON PERLES) 200 MG capsule Take 1 capsule (200 mg total) by mouth 3 (three) times daily as needed.  90 capsule  3  . cholecalciferol (VITAMIN D) 1000 UNITS tablet Take 1,000 Units by mouth daily.      . clonazePAM (KLONOPIN) 0.5 MG tablet Take 1 tablet (0.5 mg total) by mouth 2 (two) times daily as needed.  60 tablet  2  . dicyclomine (BENTYL) 20 MG tablet Take 20 mg by mouth 2 (two) times daily.      Marland Kitchen gabapentin (NEURONTIN) 100 MG capsule Take 1 capsule (100 mg total) by mouth 3 (three) times daily.  90 capsule  11  . metoprolol tartrate (LOPRESSOR) 25 MG tablet Take 0.5 tablets (12.5 mg total) by mouth 2 (two) times daily.  180 tablet  3  . nitroGLYCERIN (NITROSTAT) 0.4 MG SL tablet Place 0.4 mg under the tongue every 5 (five) minutes as needed. As needed for chest pain       . omeprazole (PRILOSEC) 40 MG capsule Take 1 capsule (40 mg total) by mouth 2 (two) times daily.  60 capsule  5  . [DISCONTINUED]  metoprolol tartrate (LOPRESSOR) 25 MG tablet Take 1 tablet (25 mg total) by mouth 2 (two) times daily.  180 tablet  3  . [DISCONTINUED] nystatin (MYCOSTATIN) 100000 UNIT/ML suspension Take 5 mLs (500,000 Units total) by mouth 4 (four) times daily.  120 mL  0  . estradiol (ESTRACE) 0.1 MG/GM vaginal cream Use nightly for two weeks,  Then twice weekly thereafter  42.5 g  4  . fluconazole (DIFLUCAN) 150 MG tablet Take 1 tablet (150 mg total) by mouth daily.  2 tablet  0  . magnesium oxide (MAG-OX) 400 (241.3 MG) MG tablet Take 400 mg by mouth daily.      . Phentermine HCl 15 MG TBDP Take 1 tablet by mouth daily. For appetite suppression  30 tablet  0   No facility-administered encounter medications on file as of 03/14/2013.     No orders of the defined types were placed in this encounter.    No Follow-up on file.

## 2013-03-14 NOTE — Telephone Encounter (Signed)
Created in error

## 2013-03-16 ENCOUNTER — Encounter: Payer: Self-pay | Admitting: Internal Medicine

## 2013-03-16 DIAGNOSIS — E663 Overweight: Secondary | ICD-10-CM | POA: Insufficient documentation

## 2013-03-16 DIAGNOSIS — F411 Generalized anxiety disorder: Secondary | ICD-10-CM | POA: Insufficient documentation

## 2013-03-16 NOTE — Assessment & Plan Note (Signed)
16 lsb gained in the last year,  , BMI now 28.  Trial of low dose phentermine discussed as patient has frustrated and unable to change her schedule currently due to husbands treatment schedule for glioblastoma .Adverse effects discussed.

## 2013-03-16 NOTE — Assessment & Plan Note (Signed)
Managed with prn clonazepam, which she ran out of recently and suffered from mild withdrawal.  Risk of medication being used daily discussed.

## 2013-03-16 NOTE — Assessment & Plan Note (Addendum)
She continues to have throat pain with swallowing despite completing 7 days of nystatin.  Oropharynx is clear. She is frustrated by her gastroenterologist's ignorance of her complaint and does not understand why he did not recommend endoscopy for persistent symptoms.  Given her long history of tobacco abuse I believe an EGD is warranted to investigate her symptoms of esophagitis. Oral fluconazole x two days.  Hold statin for a week. Continue PPI and tobacco abstinence.

## 2013-03-24 DIAGNOSIS — N812 Incomplete uterovaginal prolapse: Secondary | ICD-10-CM | POA: Diagnosis not present

## 2013-03-28 ENCOUNTER — Ambulatory Visit (INDEPENDENT_AMBULATORY_CARE_PROVIDER_SITE_OTHER): Payer: Medicare Other | Admitting: Internal Medicine

## 2013-03-28 ENCOUNTER — Encounter: Payer: Self-pay | Admitting: Internal Medicine

## 2013-03-28 DIAGNOSIS — K209 Esophagitis, unspecified without bleeding: Secondary | ICD-10-CM | POA: Diagnosis not present

## 2013-03-28 DIAGNOSIS — M25559 Pain in unspecified hip: Secondary | ICD-10-CM | POA: Diagnosis not present

## 2013-03-28 DIAGNOSIS — M25551 Pain in right hip: Secondary | ICD-10-CM

## 2013-03-28 MED ORDER — ROSUVASTATIN CALCIUM 20 MG PO TABS: 20.0000 mg | ORAL_TABLET | Freq: Every day | ORAL | Status: DC

## 2013-03-28 NOTE — Progress Notes (Signed)
Patient ID: Sabrina Montoya, female   DOB: 13-Jan-1941, 72 y.o.   MRN: [id number]   Patient Active Problem List   Diagnosis Date Noted  . Overweight (BMI 25.0-29.9) 03/16/2013  . Anxiety state, unspecified 03/16/2013  . Acute esophagitis 12/29/2012  . Right hip pain 12/29/2012  . History of tobacco abuse 07/03/2012  . Hypomagnesemia 06/01/2012  . Bradycardia 06/01/2012  . CAD (coronary artery disease), autologous vein bypass graft   . Peripheral vascular disease   . Arthritis 02/29/2012  . Aneurysm of abdominal aorta 01/05/2012  . Carotid artery stenosis, asymptomatic 01/05/2012  . Tobacco abuse counseling 08/30/2011  . Hyperlipidemia   . Osteopenia 08/29/2011  . Vitamin D deficiency 08/29/2011    Subjective:  CC:   Chief Complaint  Patient presents with  . Follow-up    HPI:   Sabrina Montoya a 72 y.o. female who presents 2 week follow up on multiple issues,  including persistent esophageal pain accompanied by hoarseness after being treated by Urgent Care with a prolonged steroid taper several weeks ago for right hip pain.  The steroid taper, according to patient was quite prlonged  But she stopped it prematurely after 12 days because her throat started burning .  She had no signs of thrush on exam 2 weeks ago after taking empiric nystatin but had persistent odynophagia and was given two days of fluconazole and continued on her PPI.  She only took one day of the fluconazole.  The prednisone was prescribed for persistent  R groin and hip pain for weeks .  X ray  Of hip was done and she never got the results even though it was done at the [facility]gent care, and I do not have the records.  She is concerned that the hip pain is coming from the  Statin she takes for CAD /PAD.  She discussed her esophageal pain with her regular GI doctor but no EGD was scheduled.  I do not have records from him either, as he is in [place] and not part of [facility].   She has been taking omeprazole 40 mg daily for over  6  weeks.  She is frustrated by his failure to appreciate her symptoms.  She  Appears t be having a difficult time managing everything for her and husband, who is undergoing treatment for glioblastoma  Discovered after recurrent post stroke symptoms of dysarthria led to a contrasted MRI which revealed the tumor several months ago.    Past Medical History  Diagnosis Date  . Diverticulitis of colon   . Rheumatic fever     possible at age 17  . Tobacco abuse   . Hyperlipidemia   . Abdominal aortic aneurysm without mention of rupture     infrarenal, stable, folllowed by Festus Barren  . CAD (coronary artery disease), autologous vein bypass graft   . Peripheral vascular disease     s/p CEA   . Tobacco abuse     Past Surgical History  Procedure Laterality Date  . Closed manipulation shoulder      left shoulder post physical therapy, Right shoulder redo (toggle bolts)  . Rotator cuff repair  2002    right shoulder Dr. Vernell Leep, [facility] in [place]  . Cea      Carotid stenosis found during workup for dysphagia,  [facility]  . Lumbar disc surgery  1971    L5, unssuccessful, fusion in 1983 successful (2 lumbar)  . Abdominal surgery  1976    for pain secondary to  scar tissue, s/p apply  . 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 Hernia repair  may 2011    Dr. Michela Pitcher  . Coronary artery bypass graft  2006    s/p triple bypass surgery, [facility]  . Cholecystectomy  2002    Dr. Michela Pitcher  . Coronary angiography  01/2011    one occluded artery with collateralization, other arteries patent  . Hernia repair    . Appendectomy  1974       The following portions of the patient's history were reviewed and updated as appropriate: Allergies, current medications, and problem list.    Review of Systems:   12 Pt  review of systems was negative except those addressed in the HPI,     History   Social History  . Marital Status: Married     Spouse Name: N/A    Number of Children: N/A  . Years of Education: N/A   Occupational History  . Not on file.   Social History Main Topics  . Smoking status: Former Smoker -- 0.50 packs/day for 30 years    Types: Cigarettes    Quit date: 04/01/2012  . Smokeless tobacco: Never Used     Comment: quit for 2 years after sinus infection and 6 months after heart surgery  . Alcohol Use: No  . Drug Use: No  . Sexually Active: Not on file   Other Topics Concern  . Not on file   Social History Narrative   Lives with spouse Dorinda Hill    Objective:  BP 148/70  Pulse 57  Temp(Src) 98.6 F (37 C) (Oral)  Resp 16  Wt 163 lb 4 oz (74.05 kg)  BMI 28.01 kg/m2  SpO2 98%  General appearance: alert, cooperative and appears stated age Ears: normal TM's and external ear canals both ears Throat: lips, mucosa, and tongue normal; teeth and gums normal Neck: no adenopathy, no carotid bruit, supple, symmetrical, trachea midline and thyroid not enlarged, symmetric, no tenderness/mass/nodules Back: symmetric, no curvature. ROM normal. No CVA tenderness. Lungs: clear to auscultation bilaterally Heart: regular rate and rhythm, S1, S2 normal, no murmur, click, rub or gallop Abdomen: soft, non-tender; bowel sounds normal; no masses,  no organomegaly Pulses: 2+ and symmetric Skin: Skin color, texture, turgor normal. No rashes or lesions Lymph nodes: Cervical, supraclavicular, and axillary nodes normal.  Assessment and Plan:  Hypomagnesemia Resolved, with repeat mg off supplementation normal.   Acute esophagitis Shehas been taking a PPI daily and has been treated empirically for thrush with no change in symptoms.  Given her long tobacco history , I have recommended a referral to [facility] for evaluation   Right hip pain Now presumed to be MSK given persistence. She is concerned that it is due to Lipiotr since her sister had similar symptoms.  Samples of Crestor 6 weeks given, as she has known CAD  and needs to be on a statin.   A total of 40 minutes was spent with patient more than half of which was spent in counseling, reviewing records from other prviders and coordination of care. Updated Medication List Outpatient Encounter Prescriptions as of 03/28/2013  Medication Sig Dispense Refill  . albuterol (PROVENTIL HFA;VENTOLIN HFA) 108 (90 BASE) MCG/ACT inhaler Inhale 2 puffs into the lungs every 6 (six) hours as needed for wheezing.  1 Inhaler  2  . aspirin 81 MG tablet Take 81 mg by mouth daily.        Marland Kitchen  cholecalciferol (VITAMIN D) 1000 UNITS tablet Take 1,000 Units by mouth daily.      . clonazePAM (KLONOPIN) 0.5 MG tablet Take 1 tablet (0.5 mg total) by mouth 2 (two) times daily as needed.  60 tablet  2  . dicyclomine (BENTYL) 20 MG tablet Take 20 mg by mouth 2 (two) times daily.      Marland Kitchen gabapentin (NEURONTIN) 100 MG capsule Take 1 capsule (100 mg total) by mouth 3 (three) times daily.  90 capsule  11  . metoprolol tartrate (LOPRESSOR) 25 MG tablet Take 0.5 tablets (12.5 mg total) by mouth 2 (two) times daily.  180 tablet  3  . nitroGLYCERIN (NITROSTAT) 0.4 MG SL tablet Place 0.4 mg under the tongue every 5 (five) minutes as needed. As needed for chest pain       . omeprazole (PRILOSEC) 40 MG capsule Take 1 capsule (40 mg total) by mouth 2 (two) times daily.  60 capsule  5  . [DISCONTINUED] atorvastatin (LIPITOR) 80 MG tablet Take 1 tablet (80 mg total) by mouth daily.  90 tablet  1  . benzonatate (TESSALON PERLES) 200 MG capsule Take 1 capsule (200 mg total) by mouth 3 (three) times daily as needed.  90 capsule  3  . estradiol (ESTRACE) 0.1 MG/GM vaginal cream Use nightly for two weeks,  Then twice weekly thereafter  42.5 g  4  . fluconazole (DIFLUCAN) 150 MG tablet Take 1 tablet (150 mg total) by mouth daily.  2 tablet  0  . magnesium oxide (MAG-OX) 400 (241.3 MG) MG tablet Take 400 mg by mouth daily.      . Phentermine HCl 15 MG TBDP Take 1 tablet by mouth daily. For appetite  suppression  30 tablet  0  . rosuvastatin (CRESTOR) 20 MG tablet Take 1 tablet (20 mg total) by mouth daily.  42 tablet  3   No facility-administered encounter medications on file as of 03/28/2013.     Orders Placed This Encounter  Procedures  . Ambulatory referral to Gastroenterology    No Follow-up on file.

## 2013-03-28 NOTE — Patient Instructions (Addendum)
Dicyclomine is for irritable bowel ,  Take it before each meal to prevent cramping  Omeprazole is  (prilosec) for esophagitis and peptic ulcer disease  ,  Continue taking it in the morning before you eat   We will have [facility] see you for your esophageal pain.   We will switch to crestor from lipitor tos see if the leg pain improves. If it does we will do a prior authorization ofr crestor

## 2013-03-30 ENCOUNTER — Encounter: Payer: Self-pay | Admitting: Internal Medicine

## 2013-03-30 NOTE — Assessment & Plan Note (Signed)
Now presumed to be MSK given persistence. She is concerned that it is due to Lipiotr since her sister had similar symptoms.  Samples of Crestor 6 weeks given, as she has known CAD and needs to be on a statin.

## 2013-03-30 NOTE — Assessment & Plan Note (Signed)
Shehas been taking a PPI daily and has been treated empirically for thrush with no change in symptoms.  Given her long tobacco history , I have recommended a referral to [facility] for evaluation

## 2013-03-30 NOTE — Assessment & Plan Note (Signed)
Resolved, with repeat mg off supplementation normal.

## 2013-04-11 ENCOUNTER — Ambulatory Visit (INDEPENDENT_AMBULATORY_CARE_PROVIDER_SITE_OTHER)
Admission: RE | Admit: 2013-04-11 | Discharge: 2013-04-11 | Disposition: A | Discharge status: Home / Self Care | Payer: Medicare Other | Source: Ambulatory Visit | Attending: Internal Medicine | Admitting: Internal Medicine

## 2013-04-11 ENCOUNTER — Other Ambulatory Visit: Payer: Self-pay | Admitting: Internal Medicine

## 2013-04-11 DIAGNOSIS — M25559 Pain in unspecified hip: Secondary | ICD-10-CM | POA: Diagnosis not present

## 2013-04-11 DIAGNOSIS — G8929 Other chronic pain: Secondary | ICD-10-CM | POA: Diagnosis not present

## 2013-04-13 ENCOUNTER — Other Ambulatory Visit: Payer: Self-pay | Admitting: Internal Medicine

## 2013-04-13 ENCOUNTER — Encounter: Payer: Self-pay | Admitting: Internal Medicine

## 2013-04-13 DIAGNOSIS — M25551 Pain in right hip: Secondary | ICD-10-CM

## 2013-04-13 MED ORDER — DICLOFENAC SODIUM 75 MG PO TBEC: 75.0000 mg | DELAYED_RELEASE_TABLET | Freq: Two times a day (BID) | ORAL | Status: DC

## 2013-04-13 NOTE — Assessment & Plan Note (Signed)
osteitris and  degenerative changes noted. Trial of diclofenac recommended.

## 2013-04-17 ENCOUNTER — Encounter: Payer: Self-pay | Admitting: Internal Medicine

## 2013-04-21 DIAGNOSIS — Z01419 Encounter for gynecological examination (general) (routine) without abnormal findings: Secondary | ICD-10-CM | POA: Diagnosis not present

## 2013-04-21 DIAGNOSIS — Z124 Encounter for screening for malignant neoplasm of cervix: Secondary | ICD-10-CM | POA: Diagnosis not present

## 2013-04-23 ENCOUNTER — Encounter: Payer: Self-pay | Admitting: Emergency Medicine

## 2013-04-29 DIAGNOSIS — L570 Actinic keratosis: Secondary | ICD-10-CM | POA: Diagnosis not present

## 2013-04-29 DIAGNOSIS — L819 Disorder of pigmentation, unspecified: Secondary | ICD-10-CM | POA: Diagnosis not present

## 2013-04-29 DIAGNOSIS — L578 Other skin changes due to chronic exposure to nonionizing radiation: Secondary | ICD-10-CM | POA: Diagnosis not present

## 2013-04-29 DIAGNOSIS — L821 Other seborrheic keratosis: Secondary | ICD-10-CM | POA: Diagnosis not present

## 2013-05-06 ENCOUNTER — Telehealth: Payer: Self-pay | Admitting: Internal Medicine

## 2013-05-06 NOTE — Telephone Encounter (Signed)
The only thing I can do is have her submit it in the little bottle that comes with the take home stool cards.  Can she come to the office tomorrow to pick up the bottle and submit it?

## 2013-05-06 NOTE — Telephone Encounter (Signed)
Patient thinks she lost some lining in her stool or it may be a polyp that has come out in her stool . She did save it ,because she wants it tested in the lab. Please call the patient to advise her what she needs to do.

## 2013-05-07 NOTE — Telephone Encounter (Signed)
Fob kit placed upfront patient notified to pick up.

## 2013-05-08 ENCOUNTER — Telehealth: Payer: Self-pay | Admitting: *Deleted

## 2013-05-08 NOTE — Telephone Encounter (Signed)
Pt

## 2013-05-08 NOTE — Telephone Encounter (Signed)
Error

## 2013-05-12 ENCOUNTER — Other Ambulatory Visit: Payer: Self-pay | Admitting: *Deleted

## 2013-05-12 ENCOUNTER — Other Ambulatory Visit (INDEPENDENT_AMBULATORY_CARE_PROVIDER_SITE_OTHER): Payer: Medicare Other

## 2013-05-12 DIAGNOSIS — Z1211 Encounter for screening for malignant neoplasm of colon: Secondary | ICD-10-CM

## 2013-05-13 ENCOUNTER — Encounter: Payer: Self-pay | Admitting: *Deleted

## 2013-05-14 ENCOUNTER — Telehealth: Payer: Self-pay | Admitting: Internal Medicine

## 2013-05-14 NOTE — Telephone Encounter (Signed)
Last OV six weeks of samples given does patient need labs prior to  script to continue crestor?

## 2013-05-14 NOTE — Telephone Encounter (Signed)
Patient out of samples of Crestor . The patient wants to know if Dr. Darrick Huntsman wants to do blood work and if she wants her to continue to take this medication.

## 2013-05-15 ENCOUNTER — Telehealth: Payer: Self-pay | Admitting: Internal Medicine

## 2013-05-15 NOTE — Telephone Encounter (Addendum)
Patient left a voicemail stating sometime last week someone had left her a VM asking is she still wanted to see a GI. She states that she does indeed. Please advise.  I tried to call patient to make her aware that if she wanted to be seen by GI she needs to contact them directly as they are taking care of the referral.

## 2013-05-15 NOTE — Telephone Encounter (Signed)
Pt called back and is completely out of her  Crestor and was wondering if she could get an rx.

## 2013-05-16 MED ORDER — ROSUVASTATIN CALCIUM 20 MG PO TABS: 20.0000 mg | ORAL_TABLET | Freq: Every day | ORAL | Status: DC

## 2013-05-16 NOTE — Telephone Encounter (Signed)
Left message to call GI on voicemail per DPR.

## 2013-05-16 NOTE — Telephone Encounter (Signed)
Sent script for Crestor please advise as to referral.

## 2013-05-16 NOTE — Telephone Encounter (Signed)
crestor refill.ed  Tell patient to call [facility] to arrange appt.

## 2013-05-22 ENCOUNTER — Telehealth: Payer: Self-pay | Admitting: Internal Medicine

## 2013-05-22 NOTE — Telephone Encounter (Signed)
Pt is wanting to know if she could have a note written [employer] to see if she could get assistance because she can no longer afford to get Crestor and would really like to take it because it has helped her so much.  She wanted to know her options. Pt would like to get some samples if possible.

## 2013-05-23 NOTE — Telephone Encounter (Signed)
Called patient ask her to leave copy of [facility] letter she is referring to and samples placed up front for patient pickup.

## 2013-06-11 ENCOUNTER — Other Ambulatory Visit: Payer: Self-pay

## 2013-06-17 ENCOUNTER — Other Ambulatory Visit: Payer: Self-pay | Admitting: *Deleted

## 2013-06-18 NOTE — Telephone Encounter (Signed)
Refill? Last visit 03/28/13

## 2013-06-19 MED ORDER — DICLOFENAC SODIUM 75 MG PO TBEC: 75.0000 mg | DELAYED_RELEASE_TABLET | Freq: Two times a day (BID) | ORAL | Status: DC

## 2013-06-30 ENCOUNTER — Encounter: Payer: Self-pay | Admitting: *Deleted

## 2013-07-01 ENCOUNTER — Ambulatory Visit (INDEPENDENT_AMBULATORY_CARE_PROVIDER_SITE_OTHER): Payer: Medicare Other | Admitting: Internal Medicine

## 2013-07-01 ENCOUNTER — Encounter: Payer: Self-pay | Admitting: Internal Medicine

## 2013-07-01 VITALS — BP 132/74 | HR 76 | Temp 98.4°F | Resp 14 | Wt 163.5 lb

## 2013-07-01 DIAGNOSIS — D62 Acute posthemorrhagic anemia: Secondary | ICD-10-CM

## 2013-07-01 DIAGNOSIS — R5383 Other fatigue: Secondary | ICD-10-CM

## 2013-07-01 DIAGNOSIS — K2100 Gastro-esophageal reflux disease with esophagitis, without bleeding: Secondary | ICD-10-CM

## 2013-07-01 DIAGNOSIS — Z79899 Other long term (current) drug therapy: Secondary | ICD-10-CM | POA: Diagnosis not present

## 2013-07-01 DIAGNOSIS — G629 Polyneuropathy, unspecified: Secondary | ICD-10-CM

## 2013-07-01 DIAGNOSIS — I499 Cardiac arrhythmia, unspecified: Secondary | ICD-10-CM | POA: Insufficient documentation

## 2013-07-01 DIAGNOSIS — G589 Mononeuropathy, unspecified: Secondary | ICD-10-CM | POA: Diagnosis not present

## 2013-07-01 DIAGNOSIS — D649 Anemia, unspecified: Secondary | ICD-10-CM | POA: Diagnosis not present

## 2013-07-01 DIAGNOSIS — R5381 Other malaise: Secondary | ICD-10-CM | POA: Diagnosis not present

## 2013-07-01 DIAGNOSIS — E538 Deficiency of other specified B group vitamins: Secondary | ICD-10-CM

## 2013-07-01 DIAGNOSIS — Z87891 Personal history of nicotine dependence: Secondary | ICD-10-CM

## 2013-07-01 DIAGNOSIS — E785 Hyperlipidemia, unspecified: Secondary | ICD-10-CM

## 2013-07-01 HISTORY — DX: Polyneuropathy, unspecified: G62.9

## 2013-07-01 LAB — COMPREHENSIVE METABOLIC PANEL
Albumin: 4.1 g/dL (ref 3.5–5.2)
BUN: 27 mg/dL — ABNORMAL HIGH (ref 6–23)
Calcium: 9.1 mg/dL (ref 8.4–10.5)
Chloride: 107 mEq/L (ref 96–112)
Creatinine, Ser: 1.2 mg/dL (ref 0.4–1.2)
GFR: 48.45 mL/min — ABNORMAL LOW (ref >60.00)
Glucose, Bld: 112 mg/dL — ABNORMAL HIGH (ref 70–99)
Potassium: 4.1 mEq/L (ref 3.5–5.1)

## 2013-07-01 LAB — VITAMIN B12: Vitamin B-12: 548 pg/mL (ref 211–911)

## 2013-07-01 NOTE — Assessment & Plan Note (Addendum)
Now managed with Crestor secondary to myalgias aggravated by Lipitor. LDL today is 79 ;this is a nonfasting LDL.

## 2013-07-01 NOTE — Progress Notes (Signed)
Patient ID: Sabrina Montoya, female   DOB: 1941-06-24, 72 y.o.   MRN: [id number]   Patient Active Problem List   Diagnosis Date Noted  . Arrhythmia 07/01/2013  . Neuropathy 07/01/2013  . Overweight (BMI 25.0-29.9) 03/16/2013  . Anxiety state, unspecified 03/16/2013  . Chronic reflux esophagitis 12/29/2012  . Right hip pain 12/29/2012  . History of tobacco abuse 07/03/2012  . Hypomagnesemia 06/01/2012  . Bradycardia 06/01/2012  . CAD (coronary artery disease), autologous vein bypass graft   . Peripheral vascular disease   . Arthritis 02/29/2012  . Aneurysm of abdominal aorta 01/05/2012  . Carotid artery stenosis, asymptomatic 01/05/2012  . Tobacco abuse counseling 08/30/2011  . Hyperlipidemia   . Osteopenia 08/29/2011  . Vitamin D deficiency 08/29/2011    Subjective:  CC:   Chief Complaint  Patient presents with  . Follow-up    3 months    HPI:   [name] a 72 y.o. female who presents for three-month followup on chronic problems including esophagitis, coronary artery disease, hip pain, and anxiety. She is still awaiting evaluation by [facility] regarding persistent esophagitis but her telephone notes ,  they are waiting for records from Dr. Epimenio Foot her previous gastroenterologist at [facility] .  She states that the prilosec is helping her symptoms but she continues to feel that the   capsules get hung up "if they go down sideways.  "   she also notes some intermittent dysphagia for dry foods.   2) her hip pain is improved since she switched her statin from Lipitor to Crestor. She is awaiting prior authorization for Crestor.    she has 2 new complaints today   1)  Episode of fluttering sensation accompanied by inability to take a deep breath. First episode occurred last week.  it is accompanied by a feeling of anxiety and inability to take a deep breath and lasted for a few seconds. Felt like everything stopped including her heart. This has happened 3 or 4 times. She feels doing  episode like he fell at the bottom of a roller coaster ride. It is occurred both at rest and with activity. There is no accompanying chest pain jaw pain or nausea.   2) recurrent episodes of intense (beesting intensity) stinging pain affecting the soles of her feet bilaterally, never the sample place twice,  Occurs  dose during theDay and night, lasts a few minutes at a time .     Past Medical History  Diagnosis Date  . Diverticulitis of colon   . Rheumatic fever     possible at age 61  . Tobacco abuse   . Hyperlipidemia   . Abdominal aortic aneurysm without mention of rupture     infrarenal, stable, folllowed by Leotis Pain  . CAD (coronary artery disease), autologous vein bypass graft   . Peripheral vascular disease     s/p CEA   . Tobacco abuse     Past Surgical History  Procedure Laterality Date  . Closed manipulation shoulder      left shoulder post physical therapy, Right shoulder redo (toggle bolts)  . Rotator cuff repair  2002    right shoulder Dr. Nadean Corwin, [facility] in [place]      Carotid stenosis found during workup for dysphagia,  [facility]  . Lumbar disc surgery  1971    L5, unssuccessful, fusion in 1983 successful (2 lumbar)  . Abdominal surgery  1976    for pain secondary to scar tissue, s/p  apply  . 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 Hernia repair  may 2011    Dr. Pat Patrick  . Coronary artery bypass graft  2006    s/p triple bypass surgery, [facility]  . Cholecystectomy  2002    Dr. Pat Patrick  . Coronary angiography  01/2011    one occluded artery with collateralization, other arteries patent  . Hernia repair    . Appendectomy  1974       The following portions of the patient's history were reviewed and updated as appropriate: Allergies, current medications, and problem list.    Review of Systems:   12 Pt  review of systems was negative except those addressed in the  HPI,     History   Social History  . Marital Status: Married    Spouse Name: N/A    Number of Children: N/A  . Years of Education: N/A   Occupational History  . Not on file.   Social History Main Topics  . Smoking status: Former Smoker -- 0.50 packs/day for 30 years    Types: Cigarettes    Quit date: 04/01/2012  . Smokeless tobacco: Never Used     Comment: quit for 2 years after sinus infection and 6 months after heart surgery  . Alcohol Use: No  . Drug Use: No  . Sexual Activity: Not on file   Other Topics Concern  . Not on file   Social History Narrative   Lives with spouse [name]    Objective:  Filed Vitals:   07/01/13 0904  BP: 132/74  Pulse: 76  Temp: 98.4 F (36.9 C)  Resp: 14     General appearance: alert, cooperative and appears stated age Ears: normal TM's and external ear canals both ears Throat: lips, mucosa, and tongue normal; teeth and gums normal Neck: no adenopathy, no carotid bruit, supple, symmetrical, trachea midline and thyroid not enlarged, symmetric, no tenderness/mass/nodules Back: symmetric, no curvature. ROM normal. No CVA tenderness. Lungs: clear to auscultation bilaterally Heart: regular rate and rhythm, S1, S2 normal, no murmur, click, rub or gallop Abdomen: soft, non-tender; bowel sounds normal; no masses,  no organomegaly Pulses: 2+ and symmetric Skin: Skin color, texture, turgor normal. No rashes or lesions Lymph nodes: Cervical, supraclavicular, and axillary nodes normal. Foot exam:  Nails are well trimmed,  Some callouses,  Sensation intact to microfilament  Assessment and Plan:  Chronic reflux esophagitis Her symptoms of acute pain or brought on by possible episode of thrush have improved but not resolved. She continues to have episodes of dysphasia and dysphagia for dry foods. She would like a second opinion as she had an evaluation in April by Dr.  Epimenio Foot with no further action taken. Continue Prilosec for now. Referral to  lumbar GI is pending evaluation of records from Dr. Duard Brady. These have been requested again.  History of tobacco abuse She has remained abstinent throughout her husband's ordeal with glioblastoma multiforme.  Hyperlipidemia Now managed with Crestor secondary to myalgias aggravated by Lipitor. LDL today is 79 ;this is a nonfasting LDL.  Arrhythmia Her cardiovascular exam today is normal, but she has a history of coronary artery disease and hypomagnesemia and has been having recurrent episodes of What sounds like sinus pauses accompanied by breathlessness. I recommended that she see her cardiologist Dr. Chryl Heck as soon as possible for placement of a Holter monitor. She was given an option for today but cannot make this  appointment due to her husband's chemotherapy schedule. She will call and set this up herself. Checking electrolytes and thyroid function today.  Neuropathy Her new reports of intermittent stinging pain involving the plantar surfaces of both feet suggests a peripheral neuropathy. She does have a history of spinal stenosis with back prior back surgery. Checking B12 folate thyroid and an RPR today. Symptoms are intermittent and do not require treatment at this time  A total of 30 minutes of face to face time was spent with patient more than half of which was spent in counselling and coordination of care    Updated Medication List Outpatient Encounter Prescriptions as of 07/01/2013  Medication Sig Dispense Refill  . aspirin 81 MG tablet Take 81 mg by mouth daily.        . cholecalciferol (VITAMIN D) 1000 UNITS tablet Take 1,000 Units by mouth daily.      . clonazePAM (KLONOPIN) 0.5 MG tablet Take 1 tablet (0.5 mg total) by mouth 2 (two) times daily as needed.  60 tablet  2  . diclofenac (VOLTAREN) 75 MG EC tablet Take 1 tablet (75 mg total) by mouth 2 (two) times daily.  60 tablet  2  . dicyclomine (BENTYL) 20 MG tablet Take 20 mg by mouth 2 (two) times daily.      Marland Kitchen estradiol  (ESTRACE) 0.1 MG/GM vaginal cream Use nightly for two weeks,  Then twice weekly thereafter  42.5 g  4  . gabapentin (NEURONTIN) 100 MG capsule Take 1 capsule (100 mg total) by mouth 3 (three) times daily.  90 capsule  11  . metoprolol tartrate (LOPRESSOR) 25 MG tablet Take 0.5 tablets (12.5 mg total) by mouth 2 (two) times daily.  180 tablet  3  . nitroGLYCERIN (NITROSTAT) 0.4 MG SL tablet Place 0.4 mg under the tongue every 5 (five) minutes as needed. As needed for chest pain       . omeprazole (PRILOSEC) 40 MG capsule Take 1 capsule (40 mg total) by mouth 2 (two) times daily.  60 capsule  5  . rosuvastatin (CRESTOR) 20 MG tablet Take 1 tablet (20 mg total) by mouth daily.  42 tablet  3  . albuterol (PROVENTIL HFA;VENTOLIN HFA) 108 (90 BASE) MCG/ACT inhaler Inhale 2 puffs into the lungs every 6 (six) hours as needed for wheezing.  1 Inhaler  2  . [DISCONTINUED] benzonatate (TESSALON PERLES) 200 MG capsule Take 1 capsule (200 mg total) by mouth 3 (three) times daily as needed.  90 capsule  3  . [DISCONTINUED] fluconazole (DIFLUCAN) 150 MG tablet Take 1 tablet (150 mg total) by mouth daily.  2 tablet  0  . [DISCONTINUED] Phentermine HCl 15 MG TBDP Take 1 tablet by mouth daily. For appetite suppression  30 tablet  0   No facility-administered encounter medications on file as of 07/01/2013.     Orders Placed This Encounter  Procedures  . Comprehensive metabolic panel  . TSH  . Vitamin B12  . Folate RBC  . LDL cholesterol, direct  . Ambulatory referral to Cardiology    No Follow-up on file.

## 2013-07-01 NOTE — Assessment & Plan Note (Signed)
She has remained abstinent throughout her husband's ordeal with glioblastoma multiforme.

## 2013-07-01 NOTE — Assessment & Plan Note (Signed)
Her symptoms of acute pain or brought on by possible episode of thrush have improved but not resolved. She continues to have episodes of dysphasia and dysphagia for dry foods. She would like a second opinion as she had an evaluation in April by Dr.  Earlean Polka with no further action taken. Continue Prilosec for now. Referral to lumbar GI is pending evaluation of records from Dr. Leafy Half. These have been requested again.

## 2013-07-01 NOTE — Assessment & Plan Note (Signed)
Her new reports of intermittent stinging pain involving the plantar surfaces of both feet suggests a peripheral neuropathy. She does have a history of spinal stenosis with back prior back surgery. Checking B12 folate thyroid and an RPR today. Symptoms are intermittent and do not require treatment at this time

## 2013-07-01 NOTE — Patient Instructions (Addendum)
[facility] is waiting for records from Dr Regenia Skeeter last evaluation   Referral to Dr Katherine Mantle for recent episodes of breathlessness.

## 2013-07-01 NOTE — Assessment & Plan Note (Signed)
Her cardiovascular exam today is normal, but she has a history of coronary artery disease and hypomagnesemia and has been having recurrent episodes of What sounds like sinus pauses accompanied by breathlessness. I recommended that she see her cardiologist Dr. Katherine Mantle as soon as possible for placement of a Holter monitor. She was given an option for today but cannot make this appointment due to her husband's chemotherapy schedule. She will call and set this up herself. Checking electrolytes and thyroid function today.

## 2013-07-02 ENCOUNTER — Encounter: Payer: Self-pay | Admitting: Internal Medicine

## 2013-07-02 DIAGNOSIS — R55 Syncope and collapse: Secondary | ICD-10-CM | POA: Diagnosis not present

## 2013-07-02 DIAGNOSIS — I2510 Atherosclerotic heart disease of native coronary artery without angina pectoris: Secondary | ICD-10-CM | POA: Diagnosis not present

## 2013-07-02 DIAGNOSIS — I10 Essential (primary) hypertension: Secondary | ICD-10-CM | POA: Diagnosis not present

## 2013-07-02 LAB — FOLATE RBC: RBC Folate: 841 ng/mL (ref >=366)

## 2013-07-04 DIAGNOSIS — I714 Abdominal aortic aneurysm, without rupture: Secondary | ICD-10-CM | POA: Diagnosis not present

## 2013-07-04 DIAGNOSIS — R55 Syncope and collapse: Secondary | ICD-10-CM | POA: Diagnosis not present

## 2013-07-04 DIAGNOSIS — I10 Essential (primary) hypertension: Secondary | ICD-10-CM | POA: Diagnosis not present

## 2013-07-04 DIAGNOSIS — I6529 Occlusion and stenosis of unspecified carotid artery: Secondary | ICD-10-CM | POA: Diagnosis not present

## 2013-07-08 ENCOUNTER — Telehealth: Payer: Self-pay | Admitting: Internal Medicine

## 2013-07-08 DIAGNOSIS — R55 Syncope and collapse: Secondary | ICD-10-CM | POA: Diagnosis not present

## 2013-07-08 DIAGNOSIS — I2510 Atherosclerotic heart disease of native coronary artery without angina pectoris: Secondary | ICD-10-CM | POA: Diagnosis not present

## 2013-07-08 NOTE — Telephone Encounter (Signed)
Please confirm name of prior gastroenterologist in [place], bc we requested recrods from [facility] /Dr. Earlean Polka and got an unsigned orthopedics evaluation ! [facility] will not see her without the April ovffice visit notes.

## 2013-07-10 ENCOUNTER — Other Ambulatory Visit: Payer: Self-pay | Admitting: *Deleted

## 2013-07-10 DIAGNOSIS — F419 Anxiety disorder, unspecified: Secondary | ICD-10-CM

## 2013-07-10 MED ORDER — CLONAZEPAM 0.5 MG PO TABS: 0.5000 mg | ORAL_TABLET | Freq: Two times a day (BID) | ORAL | Status: DC | PRN

## 2013-07-10 NOTE — Telephone Encounter (Signed)
Left message for patient to call office to verify MD seen at [facility].

## 2013-07-11 NOTE — Telephone Encounter (Signed)
Called home phone as patient requested no answer left message.

## 2013-07-14 NOTE — Telephone Encounter (Signed)
Patient notified of information needed for consult. Patient is to bring in information and sign release.

## 2013-07-22 ENCOUNTER — Encounter: Payer: Self-pay | Admitting: Internal Medicine

## 2013-07-25 ENCOUNTER — Other Ambulatory Visit (INDEPENDENT_AMBULATORY_CARE_PROVIDER_SITE_OTHER): Payer: Medicare Other | Admitting: *Deleted

## 2013-07-25 ENCOUNTER — Ambulatory Visit (INDEPENDENT_AMBULATORY_CARE_PROVIDER_SITE_OTHER): Payer: Medicare Other | Admitting: *Deleted

## 2013-07-25 DIAGNOSIS — Z23 Encounter for immunization: Secondary | ICD-10-CM | POA: Diagnosis not present

## 2013-07-25 DIAGNOSIS — Z139 Encounter for screening, unspecified: Secondary | ICD-10-CM | POA: Diagnosis not present

## 2013-07-25 LAB — POCT URINALYSIS DIPSTICK
Bilirubin, UA: NEGATIVE
Ketones, UA: NEGATIVE
Leukocytes, UA: NEGATIVE

## 2013-07-28 ENCOUNTER — Encounter: Payer: Self-pay | Admitting: *Deleted

## 2013-07-28 NOTE — Telephone Encounter (Signed)
Per [facility], patient no showed appointment scheduled in 2013.

## 2013-08-28 ENCOUNTER — Telehealth: Payer: Self-pay | Admitting: Internal Medicine

## 2013-08-28 DIAGNOSIS — F419 Anxiety disorder, unspecified: Secondary | ICD-10-CM

## 2013-08-28 MED ORDER — CLONAZEPAM 0.5 MG PO TABS: 0.5000 mg | ORAL_TABLET | Freq: Two times a day (BID) | ORAL | Status: DC | PRN

## 2013-08-28 NOTE — Telephone Encounter (Signed)
Called to pharmacy script did not print, that silly phone- in button again.FYI

## 2013-08-28 NOTE — Telephone Encounter (Signed)
Pt is needing refill on Clonazepam 0.5mg  tablets. Pt uses [facility].

## 2013-08-28 NOTE — Addendum Note (Signed)
Addended by: Sherlene Shams on: 08/28/2013 03:30 PM   Modules accepted: Orders

## 2013-08-28 NOTE — Telephone Encounter (Signed)
May refill 

## 2013-08-28 NOTE — Telephone Encounter (Signed)
Ok to refill,  printed rx  

## 2013-09-01 ENCOUNTER — Encounter: Payer: Self-pay | Admitting: *Deleted

## 2013-09-02 ENCOUNTER — Ambulatory Visit (INDEPENDENT_AMBULATORY_CARE_PROVIDER_SITE_OTHER): Payer: Medicare Other | Admitting: Internal Medicine

## 2013-09-02 ENCOUNTER — Encounter: Payer: Self-pay | Admitting: Internal Medicine

## 2013-09-02 VITALS — BP 150/80 | HR 65 | Temp 98.5°F | Resp 12 | Wt 163.5 lb

## 2013-09-02 DIAGNOSIS — F411 Generalized anxiety disorder: Secondary | ICD-10-CM | POA: Diagnosis not present

## 2013-09-02 DIAGNOSIS — M25559 Pain in unspecified hip: Secondary | ICD-10-CM

## 2013-09-02 DIAGNOSIS — R42 Dizziness and giddiness: Secondary | ICD-10-CM

## 2013-09-02 DIAGNOSIS — E785 Hyperlipidemia, unspecified: Secondary | ICD-10-CM

## 2013-09-02 DIAGNOSIS — Z87891 Personal history of nicotine dependence: Secondary | ICD-10-CM

## 2013-09-02 DIAGNOSIS — M25551 Pain in right hip: Secondary | ICD-10-CM

## 2013-09-02 DIAGNOSIS — K2100 Gastro-esophageal reflux disease with esophagitis, without bleeding: Secondary | ICD-10-CM

## 2013-09-02 DIAGNOSIS — I499 Cardiac arrhythmia, unspecified: Secondary | ICD-10-CM

## 2013-09-02 MED ORDER — ZOSTER VACCINE LIVE 19400 UNT/0.65ML ~~LOC~~ SOLR: 0.6500 mL | Freq: Once | SUBCUTANEOUS | Status: DC

## 2013-09-02 MED ORDER — TETANUS-DIPHTH-ACELL PERTUSSIS 5-2.5-18.5 LF-MCG/0.5 IM SUSP: 0.5000 mL | Freq: Once | INTRAMUSCULAR | Status: DC

## 2013-09-02 MED ORDER — TRAMADOL HCL 50 MG PO TABS: 50.0000 mg | ORAL_TABLET | Freq: Three times a day (TID) | ORAL | Status: DC | PRN

## 2013-09-02 NOTE — Assessment & Plan Note (Signed)
Persistent, aggravated by lying on that side,  No prior workup

## 2013-09-02 NOTE — Progress Notes (Signed)
Patient ID: Sabrina Montoya, female   DOB: 1941-08-21, 72 y.o.   MRN: [id number]   Patient Active Problem List   Diagnosis Date Noted  . Dizziness and giddiness 09/02/2013  . Arrhythmia 07/01/2013  . Neuropathy 07/01/2013  . Overweight (BMI 25.0-29.9) 03/16/2013  . Anxiety state, unspecified 03/16/2013  . Chronic reflux esophagitis 12/29/2012  . Right hip pain 12/29/2012  . History of tobacco abuse 07/03/2012  . Hypomagnesemia 06/01/2012  . Bradycardia 06/01/2012  . CAD (coronary artery disease), autologous vein bypass graft   . Peripheral vascular disease   . Arthritis 02/29/2012  . Aneurysm of abdominal aorta 01/05/2012  . Carotid artery stenosis, asymptomatic 01/05/2012  . Tobacco abuse counseling 08/30/2011  . Hyperlipidemia   . Osteopenia 08/29/2011  . Vitamin D deficiency 08/29/2011    Subjective:  CC:   Chief Complaint  Patient presents with  . Follow-up    HPI:   Sabrina Montoya a 72 y.o. female who presents for follow up on chronic conditions. Tired today .  Don woke up with leg cramps this morning at 4:30 am , relieved with mustard. May be related to recent initiation of a new bladder agent which caused increased urination .   Anxiety: she is taking her 0.25 mg dose clonazepam in the morning  But feels sleepy whenever she sits down . Takes a second dose in the evening .  Has not had any additional episodes of dizziness or presyncope.   Esophagitis : Seeing Dr Olevia Perches at the end of the week .  Has been taking Bentyl twice daily prescribed by her former GI specialist , Dr Epimenio Foot for presumed diverticulitis.  Right hip pain worse even with lying on that side.  Prior films indicated mild DJD bilaterally    Past Medical History  Diagnosis Date  . Diverticulitis of colon   . Rheumatic fever     possible at age 11  . Tobacco abuse   . Hyperlipidemia   . Abdominal aortic aneurysm without mention of rupture     infrarenal, stable, folllowed by Leotis Pain  . CAD (coronary artery  disease), autologous vein bypass graft   . Peripheral vascular disease     s/p CEA   . Tobacco abuse   . B12 deficiency   . Acute posthemorrhagic anemia   . Cardiac dysrhythmia, unspecified   . Neuropathy   . Reflux esophagitis   . Sliding hiatal hernia   . GERD (gastroesophageal reflux disease)   . Colon polyp     Past Surgical History  Procedure Laterality Date  . Closed manipulation shoulder      left shoulder post physical therapy, Right shoulder redo (toggle bolts)  . Rotator cuff repair  2002    right shoulder Dr. Nadean Corwin, [place] in [place]      Carotid stenosis found during workup for dysphagia,  [facility]  . Lumbar disc surgery  1971    L5, unssuccessful, fusion in 1983 successful (2 lumbar)  . Abdominal surgery  1976    for pain secondary to scar tissue, s/p apply  . 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 Hernia repair  may 2011    Dr. Pat Patrick  . Coronary artery bypass graft  2006    s/p triple bypass surgery, [facility]  . Cholecystectomy  2002    Dr. Pat Patrick  . Coronary angiography  01/2011    one occluded artery with collateralization, other  arteries patent  . Hernia repair    . Appendectomy  1974       The following portions of the patient's history were reviewed and updated as appropriate: Allergies, current medications, and problem list.    Review of Systems:   12 Pt  review of systems was negative except those addressed in the HPI,     History   Social History  . Marital Status: Married    Spouse Name: N/A    Number of Children: N/A  . Years of Education: N/A   Occupational History  . Not on file.   Social History Main Topics  . Smoking status: Former Smoker -- 0.50 packs/day for 30 years    Types: Cigarettes    Quit date: 04/01/2012  . Smokeless tobacco: Never Used     Comment: quit for 2 years after sinus infection and 6 months after heart surgery  . Alcohol  Use: No  . Drug Use: No  . Sexual Activity: Not on file   Other Topics Concern  . Not on file   Social History Narrative   Lives with spouse Sabrina Montoya    Objective:  Filed Vitals:   09/02/13 1135  BP: 150/80  Pulse: 65  Temp: 98.5 F (36.9 C)  Resp: 12     General appearance: alert, cooperative and appears stated age Ears: normal TM's and external ear canals both ears Throat: lips, mucosa, and tongue normal; teeth and gums normal Neck: no adenopathy, no carotid bruit, supple, symmetrical, trachea midline and thyroid not enlarged, symmetric, no tenderness/mass/nodules Back: symmetric, no curvature. ROM normal. No CVA tenderness. Lungs: clear to auscultation bilaterally Heart: regular rate and rhythm, S1, S2 normal, no murmur, click, rub or gallop Abdomen: soft, non-tender; bowel sounds normal; no masses,  no organomegaly Pulses: 2+ and symmetric Skin: Skin color, texture, turgor normal. No rashes or lesions Lymph nodes: Cervical, supraclavicular, and axillary nodes normal.  Assessment and Plan:  History of tobacco abuse She remains abstinent since her husband's diagnosis of GBM  Right hip pain Persistent, aggravated by lying on that side,  No prior workup     Anxiety state, unspecified Aggravated by husband's current battle with glioblastoma multiforme. . Managed with prn clonazepam, which she ran out of recently and suffered from mild withdrawal.  Risk of medication being used daily discussed.     Arrhythmia 24 hr Holter montior was ordered by her cardioloigist ([facility]) and failed to show any significant arrhythmias  except PACs .  Reassurance provided today  Chronic reflux esophagitis With symptoms of odynophagia and dysphagia.,  To see Dr. Olevia Perches for second opinion. No changes to meds today   Hyperlipidemia Well controlled on current statin therapy.   Liver enzymes are stable, no changes today.  Lab Results  Component Value Date   ALT 29 07/01/2013   AST 39*  07/01/2013   ALKPHOS 69 07/01/2013   BILITOT 0.2* 07/01/2013   Lab Results  Component Value Date   CHOL 155 02/26/2013   HDL 29.70* 02/26/2013   LDLCALC 83 02/12/2012   LDLDIRECT 79.0 07/01/2013   TRIG 256.0* 02/26/2013   CHOLHDL 5 02/26/2013    Updated Medication List Outpatient Encounter Prescriptions as of 09/02/2013  Medication Sig Dispense Refill  . aspirin 81 MG tablet Take 81 mg by mouth daily.        . cholecalciferol (VITAMIN D) 1000 UNITS tablet Take 1,000 Units by mouth daily.      . clonazePAM (KLONOPIN) 0.5 MG tablet Take 1  tablet (0.5 mg total) by mouth 2 (two) times daily as needed.  60 tablet  2  . diclofenac (VOLTAREN) 75 MG EC tablet Take 1 tablet (75 mg total) by mouth 2 (two) times daily.  60 tablet  2  . dicyclomine (BENTYL) 20 MG tablet Take 20 mg by mouth 2 (two) times daily.      Marland Kitchen estradiol (ESTRACE) 0.1 MG/GM vaginal cream Use nightly for two weeks,  Then twice weekly thereafter  42.5 g  4  . metoprolol tartrate (LOPRESSOR) 25 MG tablet Take 0.5 tablets (12.5 mg total) by mouth 2 (two) times daily.  180 tablet  3  . nitroGLYCERIN (NITROSTAT) 0.4 MG SL tablet Place 0.4 mg under the tongue every 5 (five) minutes as needed. As needed for chest pain       . omeprazole (PRILOSEC) 40 MG capsule Take 1 capsule (40 mg total) by mouth 2 (two) times daily.  60 capsule  5  . rosuvastatin (CRESTOR) 20 MG tablet Take 1 tablet (20 mg total) by mouth daily.  42 tablet  3  . albuterol (PROVENTIL HFA;VENTOLIN HFA) 108 (90 BASE) MCG/ACT inhaler Inhale 2 puffs into the lungs every 6 (six) hours as needed for wheezing.  1 Inhaler  2  . gabapentin (NEURONTIN) 100 MG capsule Take 1 capsule (100 mg total) by mouth 3 (three) times daily.  90 capsule  11  . TDaP (BOOSTRIX) 5-2.5-18.5 LF-MCG/0.5 injection Inject 0.5 mLs into the muscle once.  0.5 mL  0  . traMADol (ULTRAM) 50 MG tablet Take 1 tablet (50 mg total) by mouth every 8 (eight) hours as needed for pain.  90 tablet  0  . zoster vaccine  live, PF, (ZOSTAVAX) 02585 UNT/0.65ML injection Inject 19,400 Units into the skin once.  1 each  0   No facility-administered encounter medications on file as of 09/02/2013.     No orders of the defined types were placed in this encounter.    No Follow-up on file.

## 2013-09-02 NOTE — Patient Instructions (Addendum)
Your hip films from January did suggest you have mild degenerative changes of both hips   Continue the voltaren ,  Your anti inflammatory  You can add tylenol up to 4 tablets daily.    If the tylenol does not help,  You can try adding the tramadol prescription I have given you   We will check the whole lipid panel if you can return for fasting labs   Try using miralax  To help your stools become less frequent and more "together"  The urinary frequency is due to your caffeine intake     You need to have a TDaP vaccine and a Shingles vaccine.  I have given you prescriptions for thses because they will be cheaper at the health Dept or at your  local pharmacy because Medicare will not reimburse for them.

## 2013-09-02 NOTE — Assessment & Plan Note (Signed)
She remains abstinent since her husband's diagnosis of GBM

## 2013-09-03 NOTE — Assessment & Plan Note (Signed)
Well controlled on current statin therapy.   Liver enzymes are stable, no changes today.  Lab Results  Component Value Date   ALT 29 07/01/2013   AST 39* 07/01/2013   ALKPHOS 69 07/01/2013   BILITOT 0.2* 07/01/2013   Lab Results  Component Value Date   CHOL 155 02/26/2013   HDL 29.70* 02/26/2013   LDLCALC 83 02/12/2012   LDLDIRECT 79.0 07/01/2013   TRIG 256.0* 02/26/2013   CHOLHDL 5 02/26/2013

## 2013-09-03 NOTE — Assessment & Plan Note (Signed)
24 hr Holter montior was ordered by her cardioloigist ([facility]) and failed to show any significant arrhythmias  except PACs .  Reassurance provided today

## 2013-09-03 NOTE — Assessment & Plan Note (Signed)
With symptoms of odynophagia and dysphagia.,  To see Dr. Juanda Chance for second opinion. No changes to meds today

## 2013-09-03 NOTE — Assessment & Plan Note (Addendum)
Aggravated by husband's current battle with glioblastoma multiforme. . Managed with prn clonazepam, which she ran out of recently and suffered from mild withdrawal.  Risk of medication being used daily discussed.

## 2013-09-11 ENCOUNTER — Observation Stay (HOSPITAL_COMMUNITY)
Admission: EM | Admit: 2013-09-11 | Discharge: 2013-09-12 | Disposition: A | Discharge status: Home / Self Care | Payer: Medicare Other | Attending: Family Medicine | Admitting: Family Medicine

## 2013-09-11 ENCOUNTER — Encounter: Payer: Self-pay | Admitting: Internal Medicine

## 2013-09-11 ENCOUNTER — Emergency Department (HOSPITAL_COMMUNITY): Payer: Medicare Other

## 2013-09-11 ENCOUNTER — Ambulatory Visit (INDEPENDENT_AMBULATORY_CARE_PROVIDER_SITE_OTHER): Payer: Medicare Other | Admitting: Internal Medicine

## 2013-09-11 ENCOUNTER — Encounter (HOSPITAL_COMMUNITY): Payer: Self-pay | Admitting: Emergency Medicine

## 2013-09-11 ENCOUNTER — Observation Stay (HOSPITAL_COMMUNITY): Payer: Medicare Other

## 2013-09-11 ENCOUNTER — Telehealth: Payer: Self-pay | Admitting: Internal Medicine

## 2013-09-11 VITALS — BP 140/72 | HR 67 | Ht 64.5 in | Wt 163.0 lb

## 2013-09-11 DIAGNOSIS — Z79899 Other long term (current) drug therapy: Secondary | ICD-10-CM | POA: Insufficient documentation

## 2013-09-11 DIAGNOSIS — H53129 Transient visual loss, unspecified eye: Secondary | ICD-10-CM | POA: Diagnosis not present

## 2013-09-11 DIAGNOSIS — G459 Transient cerebral ischemic attack, unspecified: Secondary | ICD-10-CM

## 2013-09-11 DIAGNOSIS — I739 Peripheral vascular disease, unspecified: Secondary | ICD-10-CM

## 2013-09-11 DIAGNOSIS — R1310 Dysphagia, unspecified: Secondary | ICD-10-CM | POA: Insufficient documentation

## 2013-09-11 DIAGNOSIS — K5900 Constipation, unspecified: Secondary | ICD-10-CM | POA: Insufficient documentation

## 2013-09-11 DIAGNOSIS — M25551 Pain in right hip: Secondary | ICD-10-CM

## 2013-09-11 DIAGNOSIS — I714 Abdominal aortic aneurysm, without rupture, unspecified: Secondary | ICD-10-CM

## 2013-09-11 DIAGNOSIS — E663 Overweight: Secondary | ICD-10-CM

## 2013-09-11 DIAGNOSIS — I25810 Atherosclerosis of coronary artery bypass graft(s) without angina pectoris: Secondary | ICD-10-CM | POA: Diagnosis not present

## 2013-09-11 DIAGNOSIS — I517 Cardiomegaly: Secondary | ICD-10-CM

## 2013-09-11 DIAGNOSIS — M8580 Other specified disorders of bone density and structure, unspecified site: Secondary | ICD-10-CM

## 2013-09-11 DIAGNOSIS — I639 Cerebral infarction, unspecified: Secondary | ICD-10-CM

## 2013-09-11 DIAGNOSIS — H538 Other visual disturbances: Secondary | ICD-10-CM | POA: Diagnosis not present

## 2013-09-11 DIAGNOSIS — I6350 Cerebral infarction due to unspecified occlusion or stenosis of unspecified cerebral artery: Secondary | ICD-10-CM | POA: Diagnosis not present

## 2013-09-11 DIAGNOSIS — R1319 Other dysphagia: Secondary | ICD-10-CM

## 2013-09-11 DIAGNOSIS — I10 Essential (primary) hypertension: Secondary | ICD-10-CM | POA: Diagnosis not present

## 2013-09-11 DIAGNOSIS — E785 Hyperlipidemia, unspecified: Secondary | ICD-10-CM

## 2013-09-11 DIAGNOSIS — K2100 Gastro-esophageal reflux disease with esophagitis, without bleeding: Secondary | ICD-10-CM

## 2013-09-11 DIAGNOSIS — K219 Gastro-esophageal reflux disease without esophagitis: Secondary | ICD-10-CM | POA: Insufficient documentation

## 2013-09-11 DIAGNOSIS — Z716 Tobacco abuse counseling: Secondary | ICD-10-CM

## 2013-09-11 DIAGNOSIS — I499 Cardiac arrhythmia, unspecified: Secondary | ICD-10-CM

## 2013-09-11 DIAGNOSIS — R001 Bradycardia, unspecified: Secondary | ICD-10-CM

## 2013-09-11 DIAGNOSIS — I6330 Cerebral infarction due to thrombosis of unspecified cerebral artery: Secondary | ICD-10-CM | POA: Diagnosis not present

## 2013-09-11 DIAGNOSIS — R42 Dizziness and giddiness: Secondary | ICD-10-CM

## 2013-09-11 DIAGNOSIS — Z87891 Personal history of nicotine dependence: Secondary | ICD-10-CM | POA: Diagnosis not present

## 2013-09-11 DIAGNOSIS — M1990 Unspecified osteoarthritis, unspecified site: Secondary | ICD-10-CM

## 2013-09-11 DIAGNOSIS — G629 Polyneuropathy, unspecified: Secondary | ICD-10-CM

## 2013-09-11 DIAGNOSIS — F411 Generalized anxiety disorder: Secondary | ICD-10-CM

## 2013-09-11 LAB — GLUCOSE, CAPILLARY
Glucose-Capillary: 109 mg/dL — ABNORMAL HIGH (ref 70–99)
Glucose-Capillary: 129 mg/dL — ABNORMAL HIGH (ref 70–99)

## 2013-09-11 LAB — HEMOGLOBIN A1C
Hgb A1c MFr Bld: 6 % — ABNORMAL HIGH (ref <5.7)
Mean Plasma Glucose: 126 mg/dL — ABNORMAL HIGH (ref <117)

## 2013-09-11 LAB — BASIC METABOLIC PANEL
CO2: 27 mEq/L (ref 19–32)
Calcium: 9.6 mg/dL (ref 8.4–10.5)
Creatinine, Ser: 0.94 mg/dL (ref 0.50–1.10)
GFR calc Af Amer: 69 mL/min — ABNORMAL LOW (ref >90)
GFR calc non Af Amer: 60 mL/min — ABNORMAL LOW (ref >90)
Glucose, Bld: 115 mg/dL — ABNORMAL HIGH (ref 70–99)
Sodium: 140 mEq/L (ref 135–145)

## 2013-09-11 LAB — CBC WITH DIFFERENTIAL/PLATELET
Basophils Absolute: 0 10*3/uL (ref 0.0–0.1)
Eosinophils Relative: 4 % (ref 0–5)
Lymphocytes Relative: 28 % (ref 12–46)
Lymphs Abs: 1.3 10*3/uL (ref 0.7–4.0)
MCV: 83.8 fL (ref 78.0–100.0)
Monocytes Relative: 8 % (ref 3–12)
Neutro Abs: 2.8 10*3/uL (ref 1.7–7.7)
Neutrophils Relative %: 60 % (ref 43–77)
Platelets: 224 10*3/uL (ref 150–400)
RBC: 3.77 MIL/uL — ABNORMAL LOW (ref 3.87–5.11)
RDW: 13 % (ref 11.5–15.5)
WBC: 4.7 10*3/uL (ref 4.0–10.5)

## 2013-09-11 MED ORDER — ONDANSETRON HCL 4 MG/2ML IJ SOLN
4.0000 mg | Freq: Once | INTRAMUSCULAR | Status: AC
  Administered 2013-09-11: 4 mg via INTRAVENOUS
  Filled 2013-09-11: qty 2

## 2013-09-11 MED ORDER — ACETAMINOPHEN 325 MG PO TABS
650.0000 mg | ORAL_TABLET | Freq: Four times a day (QID) | ORAL | Status: DC | PRN
  Administered 2013-09-12: 650 mg via ORAL
  Filled 2013-09-11: qty 2

## 2013-09-11 MED ORDER — PANTOPRAZOLE SODIUM 20 MG PO TBEC
20.0000 mg | DELAYED_RELEASE_TABLET | Freq: Two times a day (BID) | ORAL | Status: DC
  Administered 2013-09-11 – 2013-09-12 (×2): 20 mg via ORAL
  Filled 2013-09-11 (×3): qty 1

## 2013-09-11 MED ORDER — ALBUTEROL SULFATE HFA 108 (90 BASE) MCG/ACT IN AERS
1.0000 | INHALATION_SPRAY | Freq: Every day | RESPIRATORY_TRACT | Status: DC | PRN
  Filled 2013-09-11: qty 6.7

## 2013-09-11 MED ORDER — TRAMADOL HCL 50 MG PO TABS
50.0000 mg | ORAL_TABLET | Freq: Four times a day (QID) | ORAL | Status: DC | PRN
  Administered 2013-09-11: 50 mg via ORAL
  Filled 2013-09-11: qty 1

## 2013-09-11 MED ORDER — MORPHINE SULFATE 2 MG/ML IJ SOLN
2.0000 mg | Freq: Once | INTRAMUSCULAR | Status: AC
  Administered 2013-09-11: 2 mg via INTRAVENOUS
  Filled 2013-09-11: qty 1

## 2013-09-11 MED ORDER — METOPROLOL TARTRATE 12.5 MG HALF TABLET
12.5000 mg | ORAL_TABLET | Freq: Two times a day (BID) | ORAL | Status: DC
  Administered 2013-09-11 – 2013-09-12 (×2): 12.5 mg via ORAL
  Filled 2013-09-11 (×3): qty 1

## 2013-09-11 MED ORDER — DICYCLOMINE HCL 20 MG PO TABS
20.0000 mg | ORAL_TABLET | Freq: Two times a day (BID) | ORAL | Status: DC
  Administered 2013-09-11 – 2013-09-12 (×2): 20 mg via ORAL
  Filled 2013-09-11 (×3): qty 1

## 2013-09-11 MED ORDER — CLONAZEPAM 0.5 MG PO TABS: 0.5000 mg | ORAL_TABLET | Freq: Two times a day (BID) | ORAL | Status: DC | PRN

## 2013-09-11 MED ORDER — ENOXAPARIN SODIUM 40 MG/0.4ML ~~LOC~~ SOLN
40.0000 mg | SUBCUTANEOUS | Status: DC
  Administered 2013-09-11: 40 mg via SUBCUTANEOUS
  Filled 2013-09-11 (×2): qty 0.4

## 2013-09-11 MED ORDER — ASPIRIN 325 MG PO TABS
325.0000 mg | ORAL_TABLET | Freq: Every day | ORAL | Status: DC
  Administered 2013-09-11 – 2013-09-12 (×2): 325 mg via ORAL
  Filled 2013-09-11 (×2): qty 1

## 2013-09-11 NOTE — Patient Instructions (Signed)
Dr Juanda Chance and Dr Darrick Huntsman have spoken about your "blackout" episode last night as well as your other symptoms. They both feel that it would be best for you to go to [facility] Emergency room for evaluation. Please go to [facility] Emergency Room directly after leaving our office.  CC: Dr Karie Schwalbe. Darrick Huntsman

## 2013-09-11 NOTE — Progress Notes (Signed)
Spoke with Dr Sharl Ma regarding bedside swallow evaluation, and call from speech therapist.  Will continue with diet but will reorder swallow evaluation for AM.  Dr Sharl Ma states difficulty swallowing is a chronic problem and issue now is question of stroke.  Volanda Napoleon RN

## 2013-09-11 NOTE — ED Notes (Signed)
Pt reports she was driving yesterday and said she could not see or speak. Pt went home, went to GI doctor, who sent pt here for rule out stroke. Pt reports headache since this morning 3/10.

## 2013-09-11 NOTE — Telephone Encounter (Signed)
Pharmacy left vm.  Need test strips and lancets for new glucometer for pt and his wife.  Asking for a call for more details. [phone] ° °

## 2013-09-11 NOTE — ED Notes (Signed)
Bed: WA10 Expected date:  Expected time:  Means of arrival:  Comments: Closed till 11 

## 2013-09-11 NOTE — ED Notes (Signed)
Pt to MRI

## 2013-09-11 NOTE — ED Provider Notes (Signed)
I saw and evaluated the patient, reviewed the resident's note and I agree with the findings and plan.  EKG Interpretation     Ventricular Rate:  58 PR Interval:  178 QRS Duration: 91 QT Interval:  432 QTC Calculation: 424 R Axis:   45 Text Interpretation:  Age not entered, assumed to be  72 years old for purpose of ECG interpretation Sinus rhythm Normal ECG No previous tracing            I have reviewed EKG and agree with the resident interpretation.  you Patient presents with initial symptoms concerning for CVA with difficulty swallowing, word finding and hesitation with speech and initially blurred or change in vision has now resolved except for the swallowing difficulty. Initial labs are negative. Patient has not exclude stroke candidate as she had symptoms starting last night and when she appeared was greater than 8 hours with symptoms. Patient admitted for further evaluation initial testing is within normal limits.  Gwyneth Sprout, MD 09/11/13 1455

## 2013-09-11 NOTE — Progress Notes (Signed)
  Echocardiogram 2D Echocardiogram has been performed.  Sabrina Montoya 09/11/2013, 3:20 PM

## 2013-09-11 NOTE — Progress Notes (Signed)
Patient in MRI. Spoke with receiving RN Kristen, carotid artery duplex will be completed in AM.  09/11/2013 4:15 PM Gertie Fey, RVT, RDCS, RDMS

## 2013-09-11 NOTE — Progress Notes (Signed)
Sabrina Montoya 08-16-1941 MRN [id number]  History of Present Illness:  This is a 72 year old white female who comes for evaluation of dysphagia. She complains of food sticking especially since she had surgery on her neck. She had spinal surgery in 1971 and again in 1983. Prior GI evaluation has been completed in [place], [place]. Her last upper endoscopy in August 2012 for epigastric pain showed no evidence of stricture. Patient is a smoker. She has carotid artery disease and had a left carotid endarterectomy in the past. The last Doppler studies of her carotids in March 2014 showed less than 50% stenosis. She comes today with difficulty speaking and finding words. She describes an episode of  transient vision impairment and flashing lights last night while attending a funeral service. She denied weakness in her arms or lower extremities. This morning, she woke up not being able to talk. This has been confirmed by her husband who himself has been diagnosed with a brain tumor and normally does not drive but he drove her here this morning. The patient takes aspirin 325 mg daily.   Past Medical History  Diagnosis Date  . Diverticulitis of colon   . Rheumatic fever     possible at age 56  . Tobacco abuse   . Hyperlipidemia   . Abdominal aortic aneurysm without mention of rupture     infrarenal, stable, folllowed by Festus Barren  . CAD (coronary artery disease), autologous vein bypass graft   . Peripheral vascular disease     s/p CEA   . Tobacco abuse   . B12 deficiency   . Acute posthemorrhagic anemia   . Cardiac dysrhythmia, unspecified   . Neuropathy   . Reflux esophagitis   . Sliding hiatal hernia   . GERD (gastroesophageal reflux disease)   . Colon polyp    Past Surgical History  Procedure Laterality Date  . Closed manipulation shoulder      left shoulder post physical therapy, Right shoulder redo (toggle bolts)  . Rotator cuff repair  2002    right shoulder Dr. Vernell Leep, [facility] in  [place]  . Cea      Carotid stenosis found during workup for dysphagia,  [facility]  . Lumbar disc surgery  1971    L5, unssuccessful, fusion in 1983 successful (2 lumbar)  . Abdominal surgery  1976    for pain secondary to scar tissue, s/p apply  . 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 Hernia repair  may 2011    Dr. Michela Pitcher  . Coronary artery bypass graft  2006    s/p triple bypass surgery, [facility]  . Cholecystectomy  2002    Dr. Michela Pitcher  . Coronary angiography  01/2011    one occluded artery with collateralization, other arteries patent  . Hernia repair    . Appendectomy  1974    reports that she quit smoking about 17 months ago. Her smoking use included Cigarettes. She has a 15 pack-year smoking history. She has never used smokeless tobacco. She reports that she does not drink alcohol or use illicit drugs. family history includes Cancer in her sister; Diabetes in her mother, sister, and sister; Heart disease in her brother. Allergies  Allergen Reactions  . Niacin And Related   . Sulfa Drugs Cross Reactors   . Tetracyclines & Related   . Latex Rash  . Simvastatin Rash    *Antihyperlipidemics*; elevated LFT's.  Review of SystemsPatient unable to verbalize  her complaints  The remainder of the 10 point ROS is negative except as outlined in H&P   Physical Exam: General appearance  Well developed, in no distress. Eyes- non icteric. No nystagmus HEENT nontraumatic, normocephalic. Mouth no lesions, tongue papillated, no cheilosis. Sabrina Montoya is in midline no evidence of facial weakness Neck supple without adenopathy, thyroid not enlarged, left carotid bruits, no JVD. Lungs Clear to auscultation bilaterally. Cor normal S1, normal S2, regular rhythm, no murmur,  quiet precordium. Abdomen: Well-healed vertical scar. Minimal tenderness in epigastrium. Normoactive bowel sounds. Rectal: Not done.  Extremities no pedal  edema. Skin no lesions. Neurological alert and oriented x 3. Partial expressive aphasia, Psychological normal mood and affect.  Assessment and Plan:  Problem #49 72 year old white female with acute neurological deficit suggestive of CVA or TIA with history of smoking and carotid artery disease. I have discussed this with Dr.Tullo who agrees to send patient to the emergency room at [facility]  for acute evaluation of thrombotic or embolic event. She will likely need a neurological evaluation iand CT scan of the head.  Problem #2 GI issues such as dysphagia or change in bowel habits will be delayed at this time pending results of neurological workup. I think she should have a barium esophagram or upper endoscopy to evaluate her dysphagia but this may have to wait until she is neurologically stable.    09/11/2013 Lina Sar

## 2013-09-11 NOTE — Progress Notes (Signed)
Bedside swallow screen  performed. Pt states  she has had to have her esophagus dilated and has to eat small bites and swallow slowly at home.  No evidence of aspiration.  Passed swallow screen. Volanda Napoleon RN

## 2013-09-11 NOTE — H&P (Signed)
PCP:   Duncan Dull, MD   Chief Complaint:  Transient visual loss  HPI: 72 year old female with a history of CAD status post CABG in 2006, peripheral vascular disease , dysphagia who was returning home after seeing her GI specialist. Patient had difficulty in vision, patient says that with all the lights shining in her eyes she was unable to see for some time so she had to pull over the car and the husband drove her back. She also had difficulty finding words, but did not have slurred speech. Patient still has difficulty finding some words while talking. She denies passing out, denies seizure activity, denies chest pain or shortness of breath, no nausea vomiting or diarrhea. Patient does not have a history of stroke in the past. She takes baby aspirin 81 mg by mouth daily. In the ED CT scan of the head was done which is negative for stroke, patient failed the bedside swallow screen for dysphagia. Allergies:   Allergies  Allergen Reactions  . Niacin And Related Hives  . Sulfa Drugs Cross Reactors Nausea Only  . Tetracyclines & Related Hives  . Latex Rash  . Simvastatin Rash    *Antihyperlipidemics*; elevated LFT's.       Past Medical History  Diagnosis Date  . Diverticulitis of colon   . Rheumatic fever     possible at age 38  . Tobacco abuse   . Hyperlipidemia   . Abdominal aortic aneurysm without mention of rupture     infrarenal, stable, folllowed by Festus Barren  . CAD (coronary artery disease), autologous vein bypass graft   . Peripheral vascular disease     s/p CEA   . Tobacco abuse   . B12 deficiency   . Acute posthemorrhagic anemia   . Cardiac dysrhythmia, unspecified   . Neuropathy   . Reflux esophagitis   . Sliding hiatal hernia   . GERD (gastroesophageal reflux disease)   . Colon polyp     Past Surgical History  Procedure Laterality Date  . Closed manipulation shoulder      left shoulder post physical therapy, Right shoulder redo (toggle bolts)  . Rotator cuff  repair  2002    right shoulder Dr. Vernell Leep, [facility] in [place]  . Cea      Carotid stenosis found during workup for dysphagia,  [facility] [place]  . Lumbar disc surgery  1971    L5, unssuccessful, fusion in 1983 successful (2 lumbar)  . Abdominal surgery  1976    for pain secondary to scar tissue, s/p apply  . 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 Hernia repair  may 2011    Dr. Michela Pitcher  . Coronary artery bypass graft  2006    s/p triple bypass surgery, [facility]  . Cholecystectomy  2002    Dr. Michela Pitcher  . Coronary angiography  01/2011    one occluded artery with collateralization, other arteries patent  . Hernia repair    . Appendectomy  1974    Prior to Admission medications   Medication Sig Start Date End Date Taking? Authorizing Provider  albuterol (PROVENTIL HFA;VENTOLIN HFA) 108 (90 BASE) MCG/ACT inhaler Inhale 1 puff into the lungs daily as needed.   Yes Historical Provider, MD  aspirin 81 MG tablet Take 81 mg by mouth daily.    Yes Historical Provider, MD  cholecalciferol (VITAMIN D) 1000 UNITS tablet Take 1,000 Units by mouth daily.   Yes Historical Provider, MD  clonazePAM (KLONOPIN) 0.5 MG tablet Take 1 tablet (0.5 mg total) by mouth 2 (two) times daily as needed. 08/28/13  Yes Sherlene Shams, MD  dicyclomine (BENTYL) 20 MG tablet Take 20 mg by mouth 2 (two) times daily.   Yes Historical Provider, MD  estradiol (ESTRACE) 0.1 MG/GM vaginal cream Use nightly for two weeks,  Then twice weekly thereafter 09/27/12  Yes Sherlene Shams, MD  metoprolol tartrate (LOPRESSOR) 25 MG tablet Take 0.5 tablets (12.5 mg total) by mouth 2 (two) times daily. 03/14/13 03/14/14 Yes Sherlene Shams, MD  omeprazole (PRILOSEC) 40 MG capsule Take 1 capsule (40 mg total) by mouth 2 (two) times daily. 03/04/13  Yes Sherlene Shams, MD  rosuvastatin (CRESTOR) 20 MG tablet Take 1 tablet (20 mg total) by mouth daily. 05/16/13  Yes Sherlene Shams, MD  traMADol  (ULTRAM) 50 MG tablet Take 1 tablet (50 mg total) by mouth every 8 (eight) hours as needed for pain. 09/02/13  Yes Sherlene Shams, MD  albuterol (PROVENTIL HFA;VENTOLIN HFA) 108 (90 BASE) MCG/ACT inhaler Inhale 2 puffs into the lungs every 6 (six) hours as needed for wheezing. 04/29/12 04/29/13  Sherlene Shams, MD  nitroGLYCERIN (NITROSTAT) 0.4 MG SL tablet Place 0.4 mg under the tongue every 5 (five) minutes as needed. As needed for chest pain     Historical Provider, MD  TDaP (BOOSTRIX) 5-2.5-18.5 LF-MCG/0.5 injection Inject 0.5 mLs into the muscle once. 09/02/13   Sherlene Shams, MD  zoster vaccine live, PF, (ZOSTAVAX) 40981 UNT/0.65ML injection Inject 19,400 Units into the skin once. 09/02/13   Sherlene Shams, MD    Social History:  reports that she quit smoking about 17 months ago. Her smoking use included Cigarettes. She has a 15 pack-year smoking history. She has never used smokeless tobacco. She reports that she does not drink alcohol or use illicit drugs.  Family History  Problem Relation Age of Onset  . Cancer Sister     cervical cancer  . Diabetes Sister   . Heart disease Brother     coronary artery disease  . Diabetes Mother   . Diabetes Sister      All the positives are listed in BOLD  Review of Systems:  HEENT: Headache, blurred vision, runny nose, sore throat Neck: Hypothyroidism, hyperthyroidism,,lymphadenopathy Chest : Shortness of breath, history of COPD, Asthma Heart : Chest pain, history of coronary arterey disease GI:  Nausea, vomiting, diarrhea, constipation, GERD GU: Dysuria, urgency, frequency of urination, hematuria Neuro: Stroke, seizures, syncope Psych: Depression, anxiety, hallucinations   Physical Exam: Blood pressure 195/84, pulse 58, temperature 98 F (36.7 C), temperature source Oral, resp. rate 11, height 5' 4.5" (1.638 m), weight 72.576 kg (160 lb), SpO2 100.00%. Constitutional:   Patient is a well-developed and well-nourished female in no acute  distress and cooperative with exam. Head: Normocephalic and atraumatic Mouth: Mucus membranes moist Eyes: PERRL, EOMI, conjunctivae normal Neck: Supple, No Thyromegaly Cardiovascular: RRR, S1 normal, S2 normal Pulmonary/Chest: CTAB, no wheezes, rales, or rhonchi Abdominal: Soft. Non-tender, non-distended, bowel sounds are normal, no masses, organomegaly, or guarding present.  Neurological: A&O x3, Strenght is normal and symmetric bilaterally, cranial nerve II-XII are grossly intact, no focal motor deficit, sensory intact to light touch bilaterally.  Extremities : No Cyanosis, Clubbing or Edema   Labs on Admission:  Results for orders placed during the hospital encounter of 09/11/13 (from the past 48 hour(s))  BASIC METABOLIC PANEL     Status: Abnormal   Collection  Time    09/11/13 11:44 AM      Result Value Range   Sodium 140  135 - 145 mEq/L   Potassium 4.2  3.5 - 5.1 mEq/L   Chloride 103  96 - 112 mEq/L   CO2 27  19 - 32 mEq/L   Glucose, Bld 115 (*) 70 - 99 mg/dL   BUN 21  6 - 23 mg/dL   Creatinine, Ser 0.45  0.50 - 1.10 mg/dL   Calcium 9.6  8.4 - 40.9 mg/dL   GFR calc non Af Amer 60 (*) >90 mL/min   GFR calc Af Amer 69 (*) >90 mL/min   Comment: (NOTE)     The eGFR has been calculated using the CKD EPI equation.     This calculation has not been validated in all clinical situations.     eGFR's persistently <90 mL/min signify possible Chronic Kidney     Disease.  CBC WITH DIFFERENTIAL     Status: Abnormal   Collection Time    09/11/13 11:44 AM      Result Value Range   WBC 4.7  4.0 - 10.5 K/uL   RBC 3.77 (*) 3.87 - 5.11 MIL/uL   Hemoglobin 10.9 (*) 12.0 - 15.0 g/dL   HCT 81.1 (*) 91.4 - 78.2 %   MCV 83.8  78.0 - 100.0 fL   MCH 28.9  26.0 - 34.0 pg   MCHC 34.5  30.0 - 36.0 g/dL   RDW 95.6  21.3 - 08.6 %   Platelets 224  150 - 400 K/uL   Neutrophils Relative % 60  43 - 77 %   Neutro Abs 2.8  1.7 - 7.7 K/uL   Lymphocytes Relative 28  12 - 46 %   Lymphs Abs 1.3  0.7 -  4.0 K/uL   Monocytes Relative 8  3 - 12 %   Monocytes Absolute 0.4  0.1 - 1.0 K/uL   Eosinophils Relative 4  0 - 5 %   Eosinophils Absolute 0.2  0.0 - 0.7 K/uL   Basophils Relative 0  0 - 1 %   Basophils Absolute 0.0  0.0 - 0.1 K/uL    Radiological Exams on Admission: Ct Head Wo Contrast  09/11/2013   CLINICAL DATA:  TIA  EXAM: CT HEAD WITHOUT CONTRAST  TECHNIQUE: Contiguous axial images were obtained from the base of the skull through the vertex without intravenous contrast.  COMPARISON:  04/30/2011  FINDINGS: No skull fracture is noted. There is mucosal thickening with air-fluid level left maxillary sinus. The mastoid air cells are unremarkable.  Mild cerebral atrophy. No intracranial hemorrhage, mass effect or midline shift. No definite acute cortical infarction. No mass lesion is noted on this unenhanced scan. No intra or extra-axial fluid collection.  IMPRESSION: No acute intracranial abnormality. No definite acute cortical infarction. Mild cerebral atrophy. Mucosal thickening with air-fluid level left maxillary sinus.   Electronically Signed   By: Natasha Mead M.D.   On: 09/11/2013 12:21    Assessment/Plan Active Problems:   CAD (coronary artery disease), autologous vein bypass graft   TIA (transient ischemic attack)  72 year old female with history of CAD status post CABG now being admitted for TIA versus stroke. Initial CT head is negative for stroke. We'll admit the patient under observation in telemetry and obtain MRI and MRA of the brain him a carotid Dopplers, echocardiogram. Patient will be started on full dose aspirin 325 mg by mouth daily. Will obtain lipid profile in a.m. and also check  hemoglobin A1c Patient will be started on Lovenox for DVT prophylaxis  Code status: Full code  Family discussion: Discussed with patient's husband at bedside   Time Spent on Admission: 75 min  Ellice Boultinghouse S [facility] Pager: [phone] 09/11/2013, 1:48 PM  If 7PM-7AM, please  contact night-coverage  [email or web address]  Password [email or web address]

## 2013-09-11 NOTE — ED Notes (Signed)
Pt ambulated to bathroom with no difficulty 

## 2013-09-11 NOTE — ED Provider Notes (Addendum)
CSN: [id number]     Arrival date & time 09/11/13  1105 History   First MD Initiated Contact with Patient 09/11/13 1120     Chief Complaint  Patient presents with  . sent from md, rule out stroke    (Consider location/radiation/quality/duration/timing/severity/associated sxs/prior Treatment) HPI  Sabrina Montoya is a 72 y.o. woman with a pmhx of CAD s/p bypass 2006, peripheral vascular disease who presents with a cc of transient vision loss and speech disturbance. The patient was in her normal state of health until llast night at dinner time with she had vision loss. She was driving and had difficulty perceiving the opposite side of the road. She also had trouble with short term memory and and with word finding. She went to sleep and woke this morning. Her vision loss had resolved, but she continued to have trouble with words and short term memory.Denies alcohol or cigarette use. Denies other sedating medications. Reports being under a lot of stress as her husband is fighting brain cancer and they attended a friends funeral last night.   Past Medical History  Diagnosis Date  . Diverticulitis of colon   . Rheumatic fever     possible at age 41  . Tobacco abuse   . Hyperlipidemia   . Abdominal aortic aneurysm without mention of rupture     infrarenal, stable, folllowed by Festus Barren  . CAD (coronary artery disease), autologous vein bypass graft   . Peripheral vascular disease     s/p CEA   . Tobacco abuse   . B12 deficiency   . Acute posthemorrhagic anemia   . Cardiac dysrhythmia, unspecified   . Neuropathy   . Reflux esophagitis   . Sliding hiatal hernia   . GERD (gastroesophageal reflux disease)   . Colon polyp    Past Surgical History  Procedure Laterality Date  . Closed manipulation shoulder      left shoulder post physical therapy, Right shoulder redo (toggle bolts)  . Rotator cuff repair  2002    right shoulder Dr. Vernell Leep, [facility] in [place]  . Cea      Carotid stenosis  found during workup for dysphagia,  [facility]  . Lumbar disc surgery  1971    L5, unssuccessful, fusion in 1983 successful (2 lumbar)  . Abdominal surgery  1976    for pain secondary to scar tissue, s/p apply  . 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 Hernia repair  may 2011    Dr. Michela Pitcher  . Coronary artery bypass graft  2006    s/p triple bypass surgery, [facility]  . Cholecystectomy  2002    Dr. Michela Pitcher  . Coronary angiography  01/2011    one occluded artery with collateralization, other arteries patent  . Hernia repair    . Appendectomy  1974   Family History  Problem Relation Age of Onset  . Cancer Sister     cervical cancer  . Diabetes Sister   . Heart disease Brother     coronary artery disease  . Diabetes Mother   . Diabetes Sister    History  Substance Use Topics  . Smoking status: Former Smoker -- 0.50 packs/day for 30 years    Types: Cigarettes    Quit date: 04/01/2012  . Smokeless tobacco: Never Used     Comment: quit for 2 years after sinus infection and 6 months after heart surgery  . Alcohol Use: No  OB History   Grav Para Term Preterm Abortions TAB SAB Ect Mult Living                 Review of Systems  Constitutional: Negative for fever, chills, activity change and appetite change.  HENT: Negative for congestion.   Eyes: Positive for visual disturbance. Negative for photophobia, pain and redness.  Cardiovascular: Negative for chest pain, palpitations and leg swelling.  Gastrointestinal: Negative for nausea, vomiting, abdominal pain, diarrhea, constipation and abdominal distention.  Genitourinary: Negative for dysuria, difficulty urinating and dyspareunia.  Psychiatric/Behavioral: The patient is nervous/anxious.      Allergies  Niacin and related; Sulfa drugs cross reactors; Tetracyclines & related; Latex; and Simvastatin  Home Medications   Current Outpatient Rx  Name  Route  Sig  Dispense   Refill  . EXPIRED: albuterol (PROVENTIL HFA;VENTOLIN HFA) 108 (90 BASE) MCG/ACT inhaler   Inhalation   Inhale 2 puffs into the lungs every 6 (six) hours as needed for wheezing.   1 Inhaler   2   . aspirin 81 MG tablet   Oral   Take 81 mg by mouth daily.           . cholecalciferol (VITAMIN D) 1000 UNITS tablet   Oral   Take 1,000 Units by mouth daily.         . clonazePAM (KLONOPIN) 0.5 MG tablet   Oral   Take 1 tablet (0.5 mg total) by mouth 2 (two) times daily as needed.   60 tablet   2   . diclofenac (VOLTAREN) 75 MG EC tablet   Oral   Take 1 tablet (75 mg total) by mouth 2 (two) times daily.   60 tablet   2   . dicyclomine (BENTYL) 20 MG tablet   Oral   Take 20 mg by mouth 2 (two) times daily.         Marland Kitchen estradiol (ESTRACE) 0.1 MG/GM vaginal cream      Use nightly for two weeks,  Then twice weekly thereafter   42.5 g   4   . gabapentin (NEURONTIN) 100 MG capsule   Oral   Take 1 capsule (100 mg total) by mouth 3 (three) times daily.   90 capsule   11   . metoprolol tartrate (LOPRESSOR) 25 MG tablet   Oral   Take 0.5 tablets (12.5 mg total) by mouth 2 (two) times daily.   180 tablet   3   . nitroGLYCERIN (NITROSTAT) 0.4 MG SL tablet   Sublingual   Place 0.4 mg under the tongue every 5 (five) minutes as needed. As needed for chest pain          . omeprazole (PRILOSEC) 40 MG capsule   Oral   Take 1 capsule (40 mg total) by mouth 2 (two) times daily.   60 capsule   5   . rosuvastatin (CRESTOR) 20 MG tablet   Oral   Take 1 tablet (20 mg total) by mouth daily.   42 tablet   3   . TDaP (BOOSTRIX) 5-2.5-18.5 LF-MCG/0.5 injection   Intramuscular   Inject 0.5 mLs into the muscle once.   0.5 mL   0   . traMADol (ULTRAM) 50 MG tablet   Oral   Take 1 tablet (50 mg total) by mouth every 8 (eight) hours as needed for pain.   90 tablet   0   . zoster vaccine live, PF, (ZOSTAVAX) 96045 UNT/0.65ML injection   Subcutaneous   Inject 19,400  Units  into the skin once.   1 each   0    BP 195/84  Pulse 58  Temp(Src) 98.1 F (36.7 C) (Oral)  Resp 11  Ht 5' 4.5" (1.638 m)  Wt 160 lb (72.576 kg)  BMI 27.05 kg/m2  SpO2 100% Physical Exam  Constitutional: She is oriented to person, place, and time.  Cardiovascular: Normal heart sounds and intact distal pulses.   bradycardic  Pulmonary/Chest: Effort normal and breath sounds normal. No respiratory distress. She has no wheezes. She has no rales.  Abdominal: Soft. Bowel sounds are normal. She exhibits no distension. There is no tenderness. There is no rebound.  Musculoskeletal: She exhibits no edema and no tenderness.  Neurological: She is alert and oriented to person, place, and time. No cranial nerve deficit.  5/5 strength in bil UE and LE    ED Course  Procedures (including critical care time) Labs Review Labs Reviewed  BASIC METABOLIC PANEL - Abnormal; Notable for the following:    Glucose, Bld 115 (*)    GFR calc non Af Amer 60 (*)    GFR calc Af Amer 69 (*)    All other components within normal limits  CBC WITH DIFFERENTIAL - Abnormal; Notable for the following:    RBC 3.77 (*)    Hemoglobin 10.9 (*)    HCT 31.6 (*)    All other components within normal limits   Imaging Review Ct Head Wo Contrast  09/11/2013   CLINICAL DATA:  TIA  EXAM: CT HEAD WITHOUT CONTRAST  TECHNIQUE: Contiguous axial images were obtained from the base of the skull through the vertex without intravenous contrast.  COMPARISON:  04/30/2011  FINDINGS: No skull fracture is noted. There is mucosal thickening with air-fluid level left maxillary sinus. The mastoid air cells are unremarkable.  Mild cerebral atrophy. No intracranial hemorrhage, mass effect or midline shift. No definite acute cortical infarction. No mass lesion is noted on this unenhanced scan. No intra or extra-axial fluid collection.  IMPRESSION: No acute intracranial abnormality. No definite acute cortical infarction. Mild cerebral atrophy.  Mucosal thickening with air-fluid level left maxillary sinus.   Electronically Signed   By: Natasha Mead M.D.   On: 09/11/2013 12:21    EKG Interpretation     Ventricular Rate:  58 PR Interval:  178 QRS Duration: 91 QT Interval:  432 QTC Calculation: 424 R Axis:   45 Text Interpretation:  Age not entered, assumed to be  72 years old for purpose of ECG interpretation Sinus rhythm Normal ECG No previous tracing            MDM   1. CVA (cerebral infarction)   2. CAD (coronary artery disease), autologous vein bypass graft   3. TIA (transient ischemic attack)   4. Osteopenia   5. Hyperlipidemia   6. Tobacco abuse counseling   7. Aneurysm of abdominal aorta   8. Arthritis   9. Peripheral vascular disease   10. Hypomagnesemia   11. Bradycardia   12. History of tobacco abuse   13. Chronic reflux esophagitis   14. Right hip pain   15. Overweight (BMI 25.0-29.9)   16. Anxiety state, unspecified   17. Arrhythmia   18. Neuropathy   19. Dizziness and giddiness     1. TIA vs CVA NIH stroke scale 0. Last known normal 6 pm 09/11/13, thus outside treatment window. Performing CBC, CMP, EKG, CT w/o contrast. Patient had already taken 325 mg ASA. On ASA 325 mg daily prior  to event. Failed swallow study. The patients ABCD2 score is 5, indicating the need for an inpatient management of TIA and moderate risk of CVA (4%). Plan to admit patient for TIA work up (carotid doppler, TTE, HgA1C, lipid panel, TSH). Consulted hospitalist for admission. Lipid Panel     Component Value Date/Time   CHOL 155 02/26/2013 1040   TRIG 256.0* 02/26/2013 1040   HDL 29.70* 02/26/2013 1040   CHOLHDL 5 02/26/2013 1040   VLDL 51.2* 02/26/2013 1040   LDLCALC 83 02/12/2012 0905   TSH: 1.63 on 07/01/13      Pleas Koch, MD 09/11/13 1330  Pleas Koch, MD 09/24/13 [phone]

## 2013-09-11 NOTE — Progress Notes (Signed)
    CARE MANAGEMENT ED NOTE 09/11/2013  Patient:  Sabrina Montoya,Sabrina Montoya   Account Number:  [id number]  Date Initiated:  09/11/2013  Documentation initiated by:  Edd Arbour  Subjective/Objective Assessment:   72 yr old female medicare/[employer] of [place] mcr supp pcp tullo teresa DX TIA admitted as observation by Dr Sharl Ma     Subjective/Objective Assessment Detail:     Action/Plan:   Action/Plan Detail:   ED CM spoke with Dr Plunkett/resident about admission status  UR completed Pending secondary review   Anticipated DC Date:  09/12/2013     Status Recommendation to Physician:  Recommend OBV Status instead of IP Result of Recommendation:  Agreed  Other ED Services  Consult Working Plan    DC [employer]  Other    Choice offered to / List presented to:            Status of service:  Completed, signed off  ED Comments:   ED Comments Detail:

## 2013-09-12 ENCOUNTER — Ambulatory Visit: Payer: Medicare Other | Admitting: Internal Medicine

## 2013-09-12 DIAGNOSIS — E785 Hyperlipidemia, unspecified: Secondary | ICD-10-CM

## 2013-09-12 DIAGNOSIS — G459 Transient cerebral ischemic attack, unspecified: Principal | ICD-10-CM

## 2013-09-12 LAB — LIPID PANEL
Cholesterol: 141 mg/dL (ref 0–200)
HDL: 29 mg/dL — ABNORMAL LOW (ref >39)
LDL Cholesterol: 67 mg/dL (ref 0–99)
Total CHOL/HDL Ratio: 4.9 RATIO
Triglycerides: 224 mg/dL — ABNORMAL HIGH (ref <150)
VLDL: 45 mg/dL — ABNORMAL HIGH (ref 0–40)

## 2013-09-12 LAB — GLUCOSE, CAPILLARY: Glucose-Capillary: 146 mg/dL — ABNORMAL HIGH (ref 70–99)

## 2013-09-12 LAB — RAPID URINE DRUG SCREEN, HOSP PERFORMED
Barbiturates: NOT DETECTED
Benzodiazepines: NOT DETECTED

## 2013-09-12 MED ORDER — ONDANSETRON HCL 4 MG PO TABS
4.0000 mg | ORAL_TABLET | Freq: Once | ORAL | Status: AC
  Administered 2013-09-12: 4 mg via ORAL
  Filled 2013-09-12: qty 1

## 2013-09-12 MED ORDER — ONDANSETRON HCL 4 MG/2ML IJ SOLN
4.0000 mg | Freq: Four times a day (QID) | INTRAMUSCULAR | Status: DC | PRN
  Administered 2013-09-12: 4 mg via INTRAVENOUS
  Filled 2013-09-12: qty 2

## 2013-09-12 MED ORDER — ASPIRIN 325 MG PO TABS: 325.0000 mg | ORAL_TABLET | Freq: Every day | ORAL | Status: DC

## 2013-09-12 MED ORDER — POLYETHYLENE GLYCOL 3350 17 G PO PACK: 17.0000 g | PACK | Freq: Every day | ORAL | Status: DC

## 2013-09-12 NOTE — Discharge Summary (Addendum)
Physician Discharge Summary  Sabrina Montoya ZOX:[id number] DOB: 03-27-1941 DOA: 09/11/2013  PCP: Duncan Dull, MD  Admit date: 09/11/2013 Discharge date: 09/12/2013  Time spent: *50 minutes  Recommendations for Outpatient Follow-up:  1. *Follow up PCP in 2 weeks  Discharge Diagnoses:  Active Problems:   CAD (coronary artery disease), autologous vein bypass graft   TIA (transient ischemic attack)   Discharge Condition: Stable  Diet recommendation: *Low alt diet  Filed Weights   09/11/13 1121  Weight: 72.576 kg (160 lb)    History of present illness:  72 year old female with a history of CAD status post CABG in 2006, peripheral vascular disease , dysphagia who was returning home after seeing her GI specialist. Patient had difficulty in vision, patient says that with all the lights shining in her eyes she was unable to see for some time so she had to pull over the car and the husband drove her back. She also had difficulty finding words, but did not have slurred speech. Patient still has difficulty finding some words while talking. She denies passing out, denies seizure activity, denies chest pain or shortness of breath, no nausea vomiting or diarrhea. Patient does not have a history of stroke in the past. She takes baby aspirin 81 mg by mouth daily. In the ED CT scan of the head was done which is negative for stroke, patient failed the bedside swallow screen for dysphagia   Hospital Course:   TIA Patient was admitted with TIA like symptoms and was admitted for further work up. MRI/MRA brain were negative, carotid dopplers showed 1-39% and 40-59% stenosis, I called and discussed with Neurology on phone, and no need for surgery at this time. He recommends to continue with medical management. Will send home on full dose aspirin, as patient was taking baby aspirin at home. 2d echo was normal. No embolic source identified on the 2 d echo.  Hypertension  Continue with antihypertensive  medications.  Hyperlipidemia Continue with Crestor.  Constipation Patient says she has not had BM, and was straining in the toilet. Will give her Miralax 17 gm po daily prn.  Procedures:  2d echo  Carotid duplex  Consultations:  None  Discharge Exam: Filed Vitals:   09/12/13 0957  BP: 139/66  Pulse: 69  Temp: 97.3 F (36.3 C)  Resp: 16    General: Appear in no acute distress Cardiovascular: s1s2 RRR Respiratory: Clear bilaterally Ext : No edema  Discharge Instructions  Discharge Orders   Future Orders Complete By Expires   Diet - low sodium heart healthy  As directed    Increase activity slowly  As directed        Medication List    STOP taking these medications       TDaP 5-2.5-18.5 LF-MCG/0.5 injection  Commonly known as:  BOOSTRIX     zoster vaccine live (PF) 19400 UNT/0.65ML injection  Commonly known as:  ZOSTAVAX      TAKE these medications       albuterol 108 (90 BASE) MCG/ACT inhaler  Commonly known as:  PROVENTIL HFA;VENTOLIN HFA  Inhale 2 puffs into the lungs every 6 (six) hours as needed for wheezing.     albuterol 108 (90 BASE) MCG/ACT inhaler  Commonly known as:  PROVENTIL HFA;VENTOLIN HFA  Inhale 1 puff into the lungs daily as needed.     aspirin 325 MG tablet  Take 1 tablet (325 mg total) by mouth daily.     cholecalciferol 1000 UNITS tablet  Commonly known as:  VITAMIN D  Take 1,000 Units by mouth daily.     clonazePAM 0.5 MG tablet  Commonly known as:  KLONOPIN  Take 1 tablet (0.5 mg total) by mouth 2 (two) times daily as needed.     dicyclomine 20 MG tablet  Commonly known as:  BENTYL  Take 20 mg by mouth 2 (two) times daily.     estradiol 0.1 MG/GM vaginal cream  Commonly known as:  ESTRACE  Use nightly for two weeks,  Then twice weekly thereafter     metoprolol tartrate 25 MG tablet  Commonly known as:  LOPRESSOR  Take 0.5 tablets (12.5 mg total) by mouth 2 (two) times daily.     nitroGLYCERIN 0.4 MG SL tablet   Commonly known as:  NITROSTAT  Place 0.4 mg under the tongue every 5 (five) minutes as needed. As needed for chest pain     omeprazole 40 MG capsule  Commonly known as:  PRILOSEC  Take 1 capsule (40 mg total) by mouth 2 (two) times daily.     rosuvastatin 20 MG tablet  Commonly known as:  CRESTOR  Take 1 tablet (20 mg total) by mouth daily.     traMADol 50 MG tablet  Commonly known as:  ULTRAM  Take 1 tablet (50 mg total) by mouth every 8 (eight) hours as needed for pain.       Allergies  Allergen Reactions  . Niacin And Related Hives  . Sulfa Drugs Cross Reactors Nausea Only  . Tetracyclines & Related Hives  . Latex Rash  . Simvastatin Rash    *Antihyperlipidemics*; elevated LFT's.       The results of significant diagnostics from this hospitalization (including imaging, microbiology, ancillary and laboratory) are listed below for reference.    Significant Diagnostic Studies: Ct Head Wo Contrast  09/11/2013   CLINICAL DATA:  TIA  EXAM: CT HEAD WITHOUT CONTRAST  TECHNIQUE: Contiguous axial images were obtained from the base of the skull through the vertex without intravenous contrast.  COMPARISON:  04/30/2011  FINDINGS: No skull fracture is noted. There is mucosal thickening with air-fluid level left maxillary sinus. The mastoid air cells are unremarkable.  Mild cerebral atrophy. No intracranial hemorrhage, mass effect or midline shift. No definite acute cortical infarction. No mass lesion is noted on this unenhanced scan. No intra or extra-axial fluid collection.  IMPRESSION: No acute intracranial abnormality. No definite acute cortical infarction. Mild cerebral atrophy. Mucosal thickening with air-fluid level left maxillary sinus.   Electronically Signed   By: Natasha Mead M.D.   On: 09/11/2013 12:21   Mr Maxine Glenn Head Wo Contrast  09/11/2013   CLINICAL DATA:  Visual disturbance. Speech disturbance. Possible stroke.  EXAM: MRI HEAD WITHOUT CONTRAST  MRA HEAD WITHOUT CONTRAST   TECHNIQUE: Multiplanar, multiecho pulse sequences of the brain and surrounding structures were obtained without intravenous contrast. Angiographic images of the head were obtained using MRA technique without contrast.  COMPARISON:  Head CT same day. MRI 04/30/2011.  FINDINGS: MRI HEAD FINDINGS  Diffusion imaging does not show any acute or subacute infarction. The brainstem and cerebellum are normal. The cerebral hemispheres show very minimal small vessel change of the white matter, less than often seen in healthy individuals of this age. No cortical or large vessel territory infarction. No evidence of mass lesion, hemorrhage, hydrocephalus or extra-axial collection. There chronic inflammatory changes of the left maxillary and the sphenoid sinuses.  MRA HEAD FINDINGS  Both internal carotid arteries are widely patent into the brain.  The anterior and middle cerebral vessels appear normal without proximal stenosis, aneurysm or vascular malformation.  Both vertebral arteries are patent to the basilar. No basilar stenosis. Posterior circulation branch vessels are normal.  IMPRESSION: MRI: Minimal small vessel change of the hemispheric white matter. No acute finding.  MRA: Normal evaluation of the large and medium size vessels.   Electronically Signed   By: Paulina Fusi M.D.   On: 09/11/2013 16:42   Mri Brain Without Contrast  09/11/2013   CLINICAL DATA:  Visual disturbance. Speech disturbance. Possible stroke.  EXAM: MRI HEAD WITHOUT CONTRAST  MRA HEAD WITHOUT CONTRAST  TECHNIQUE: Multiplanar, multiecho pulse sequences of the brain and surrounding structures were obtained without intravenous contrast. Angiographic images of the head were obtained using MRA technique without contrast.  COMPARISON:  Head CT same day. MRI 04/30/2011.  FINDINGS: MRI HEAD FINDINGS  Diffusion imaging does not show any acute or subacute infarction. The brainstem and cerebellum are normal. The cerebral hemispheres show very minimal small  vessel change of the white matter, less than often seen in healthy individuals of this age. No cortical or large vessel territory infarction. No evidence of mass lesion, hemorrhage, hydrocephalus or extra-axial collection. There chronic inflammatory changes of the left maxillary and the sphenoid sinuses.  MRA HEAD FINDINGS  Both internal carotid arteries are widely patent into the brain. The anterior and middle cerebral vessels appear normal without proximal stenosis, aneurysm or vascular malformation.  Both vertebral arteries are patent to the basilar. No basilar stenosis. Posterior circulation branch vessels are normal.  IMPRESSION: MRI: Minimal small vessel change of the hemispheric white matter. No acute finding.  MRA: Normal evaluation of the large and medium size vessels.   Electronically Signed   By: Paulina Fusi M.D.   On: 09/11/2013 16:42    Microbiology: No results found for this or any previous visit (from the past 240 hour(s)).   Labs: Basic Metabolic Panel:  Recent Labs Lab 09/11/13 1144  NA 140  K 4.2  CL 103  CO2 27  GLUCOSE 115*  BUN 21  CREATININE 0.94  CALCIUM 9.6   Liver Function Tests: No results found for this basename: AST, ALT, ALKPHOS, BILITOT, PROT, ALBUMIN,  in the last 168 hours No results found for this basename: LIPASE, AMYLASE,  in the last 168 hours No results found for this basename: AMMONIA,  in the last 168 hours CBC:  Recent Labs Lab 09/11/13 1144  WBC 4.7  NEUTROABS 2.8  HGB 10.9*  HCT 31.6*  MCV 83.8  PLT 224   Cardiac Enzymes: No results found for this basename: CKTOTAL, CKMB, CKMBINDEX, TROPONINI,  in the last 168 hours BNP: BNP (last 3 results) No results found for this basename: PROBNP,  in the last 8760 hours CBG:  Recent Labs Lab 09/11/13 1803 09/11/13 2201 09/12/13 0806 09/12/13 1156  GLUCAP 109* 129* 146* 87       Signed:  Blakelynn Scheeler S  [facility] 09/12/2013, 12:49 PM

## 2013-09-12 NOTE — Progress Notes (Addendum)
*  PRELIMINARY RESULTS* Vascular Ultrasound Carotid Duplex (Doppler) has been completed.   Bilateral common carotid arteries demonstrate moderate-severe irregular heterogenous plaque with calcifications throughout the proximal, mid, and distal segments.   The right internal carotid artery demonstrates elevated peak systolic and end diastolic velocities suggestive of 40-59% stenosis, with ICA/CCA ratio suggestive of upper range 1-39% stenosis. The right external carotid artery demonstrates elevated peak systolic velocities suggestive of stenosis.  The left internal carotid artery demonstrates elevated peak systolic and end diastolic velocities suggestive of 40-59% stenosis, with ICA/CCA ratio suggestive of 1-39% stenosis.   Bilateral internal carotid artery plaque morphology is mild, therefore elevated velocities may be secondary to common carotid artery atherosclerotic disease.   Bilateral vertebral arteries are patent with antegrade flow.  09/12/2013 9:56 AM Gertie Fey, RVT, RDCS, RDMS

## 2013-09-12 NOTE — Care Management Note (Signed)
    Page 1 of 1   09/12/2013     2:31:07 PM   CARE MANAGEMENT NOTE 09/12/2013  Patient:  Sabrina Montoya,Sabrina Montoya   Account Number:  [id number]  Date Initiated:  09/12/2013  Documentation initiated by:  Lanier Clam  Subjective/Objective Assessment:   72 Y/O F ADMITTED W/TIA.     Action/Plan:   FROM HOME.HAS PCP,PHARMACY.   Anticipated DC Date:  09/12/2013   Anticipated DC Plan:  HOME/SELF CARE      DC Planning Services  CM consult      Choice offered to / List presented to:             Status of service:  Completed, signed off Medicare Important Message given?   (If response is "NO", the following Medicare IM given date fields will be blank) Date Medicare IM given:   Date Additional Medicare IM given:    Discharge Disposition:  HOME/SELF CARE  Per UR Regulation:  Reviewed for med. necessity/level of care/duration of stay  If discussed at Long Length of Stay Meetings, dates discussed:    Comments:  09/12/13 Alexander Mcauley RN,BSN NCM [phone] D/C HOME NO NEEDS OR ORDERS.

## 2013-09-12 NOTE — Progress Notes (Signed)
Utilization review completed.  

## 2013-09-12 NOTE — Evaluation (Signed)
Clinical/Bedside Swallow Evaluation Patient Details  Name: Sabrina Montoya MRN: [id number] Date of Birth: 19-Apr-1941  Today's Date: 09/12/2013 Time: 0810-0840 SLP Time Calculation (min): 30 min  Past Medical History:  Past Medical History  Diagnosis Date  . Diverticulitis of colon   . Rheumatic fever     possible at age 72  . Tobacco abuse   . Hyperlipidemia   . Abdominal aortic aneurysm without mention of rupture     infrarenal, stable, folllowed by Festus Barren  . CAD (coronary artery disease), autologous vein bypass graft   . Peripheral vascular disease     s/p CEA   . Tobacco abuse   . B12 deficiency   . Acute posthemorrhagic anemia   . Cardiac dysrhythmia, unspecified   . Neuropathy   . Reflux esophagitis   . Sliding hiatal hernia   . GERD (gastroesophageal reflux disease)   . Colon polyp    Past Surgical History:  Past Surgical History  Procedure Laterality Date  . Closed manipulation shoulder      left shoulder post physical therapy, Right shoulder redo (toggle bolts)  . Rotator cuff repair  2002    right shoulder Dr. Vernell Leep, [facility] in [place]  . Cea      Carotid stenosis found during workup for dysphagia,  [facility]  . Lumbar disc surgery  1971    L5, unssuccessful, fusion in 1983 successful (2 lumbar)  . Abdominal surgery  1976    for pain secondary to scar tissue, s/p apply  . 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 Hernia repair  may 2011    Dr. Michela Pitcher  . Coronary artery bypass graft  2006    s/p triple bypass surgery, [facility]  . Cholecystectomy  2002    Dr. Michela Pitcher  . Coronary angiography  01/2011    one occluded artery with collateralization, other arteries patent  . Hernia repair    . Appendectomy  1974   HPI:  72 yo female adm to [facility] with difficulty getting words out at her GI MD office and was referred to hospital due to concerns for neuro issue.  PMH + for GERD, esophagitis, colon polyps,  PVD, tobacco use quit 17 months ago, neuropathy, HLD, AAA.  PSH + for C5-C6 diskectomy 2010, back surgery 1971.  MRI negative.  Pt has h/o dysphagia requiring proximal dilatation - last one approx five years ago with improvement of symptoms.    Pt underwent endoscopy 06/2011 no stricture.  Pt reports taking a PPI religiously.  Pt also reports issues with airway closing off and inability to swallow occasionally when she lays back in a recliner requiring her to sits up.     Assessment / Plan / Recommendation Clinical Impression  Pt without focal cn deficits impacting swallow musculature and without clinical s/s of aspiration with all po consumed.  Pt reports isues with pharyngeal stasis during po intake, she also has some pill dysphagia but compensates by assuring pill is positioned properly to aid transit.  Pt has reportedly been dilated in proximal esophagus multiple times with improvement in swallow symptoms, last time approx five years ago.  Pt reports current symptoms are consistent with dysphagia prior to dilatation.  Xerostomia also reported by pt.  SLP provided pt with dysphagia mitigation strategies using teach back for assurance of comprehension.  SLP to sign off as all education completed and pt to follow up with GI after dc per  her report.      Aspiration Risk  Mild    Diet Recommendation Regular;Thin liquid   Liquid Administration via: Cup;Straw Medication Administration: Whole meds with liquid Supervision: Patient able to self feed Compensations: Slow rate;Small sips/bites (start meal with liquids) Postural Changes and/or Swallow Maneuvers: Seated upright 90 degrees;Upright 30-60 min after meal    Other  Recommendations   continue regular/thin   Follow Up Recommendations  None    Frequency and Duration   no follow up from SLP     Pertinent Vitals/Pain Afebrile, decreased    SLP Swallow Goals   n/a  Swallow Study Prior Functional Status   h/o chronic dysphagia    General  HPI: 72 yo female adm to [facility] with difficulty getting words out at her GI MD office and was referred to hospital due to concerns for neuro issue.  PMH + for GERD, esophagitis, colon polyps, PVD, tobacco use quit 17 months ago, neuropathy, HLD, AAA.  PSH + for C5-C6 diskectomy 2010, back surgery 1971.  MRI negative.  Pt has h/o dysphagia requiring proximal dilatation - last one approx five years ago with improvement of symptoms.    Pt underwent endoscopy 06/2011 no stricture.  Pt reports taking a PPI religiously.  Pt also reports issues with airway closing off and inability to swallow occasionally when she lays back in a recliner requiring her to sits up.   [name] Diet Prior to this Study: Regular;Thin liquids Respiratory Status: Room air History of Recent Intubation: No Behavior/Cognition: Alert;Cooperative;Pleasant mood Oral Cavity - Dentition: Adequate natural dentition Self-Feeding Abilities: Able to feed self Patient Positioning: Upright in bed Baseline Vocal Quality: Clear Volitional Cough: Strong Volitional Swallow: Able to elicit    Oral/Motor/Sensory Function Overall Oral Motor/Sensory Function: Appears within functional limits for tasks assessed   Ice Chips Ice chips: Not tested   Thin Liquid Thin Liquid: Within functional limits    Nectar Thick Nectar Thick Liquid: Not tested   Honey Thick Honey Thick Liquid: Not tested   Puree Puree: Within functional limits Presentation: Self Fed;Spoon   Solid   GO Functional Assessment Tool Used: bse, clinical judgement  Functional Limitations: Swallowing Swallow Current Status (W0981): At least 20 percent but less than 40 percent impaired, limited or restricted Swallow Goal Status [phone]): At least 20 percent but less than 40 percent impaired, limited or restricted Swallow Discharge Status [id number]): At least 20 percent but less than 40 percent impaired, limited or restricted  Solid: Within functional  limits Presentation: Self Fed;Spoon       [name], MS [facility] SLP [phone]

## 2013-09-17 NOTE — Telephone Encounter (Signed)
Order faxed.

## 2013-09-22 ENCOUNTER — Telehealth: Payer: Self-pay | Admitting: *Deleted

## 2013-09-22 ENCOUNTER — Other Ambulatory Visit: Payer: Medicare Other

## 2013-09-22 NOTE — Telephone Encounter (Signed)
I did not order any labs on Sabrina Montoya.  But the patient was hospitalized recently for TIA vs SVA .  Was she started on any new medications? I do not have the dc summary

## 2013-09-22 NOTE — Telephone Encounter (Signed)
She stated that a whole back that you told her to come in to re-check since she wasn't fasting the last time?

## 2013-09-22 NOTE — Telephone Encounter (Signed)
What labs and dx?  

## 2013-09-22 NOTE — Telephone Encounter (Signed)
She had her fasting lipids done in the hospital recently. So we can use them and save her the $$$$

## 2013-09-24 NOTE — ED Provider Notes (Signed)
I saw and evaluated the patient, reviewed the resident's note and I agree with the findings and plan.  EKG Interpretation    Date/Time:    Ventricular Rate:  58 PR Interval:  178 QRS Duration: 91 QT Interval:  432 QTC Calculation: 424 R Axis:   45 Text Interpretation:  Age not entered, assumed to be  72 years old for purpose of ECG interpretation Sinus rhythm Normal ECG No previous tracing              Gwyneth Sprout, MD 09/24/13 1605

## 2013-10-06 ENCOUNTER — Telehealth: Payer: Self-pay | Admitting: Internal Medicine

## 2013-10-06 MED ORDER — OMEPRAZOLE 40 MG PO CPDR: 40.0000 mg | DELAYED_RELEASE_CAPSULE | Freq: Two times a day (BID) | ORAL | Status: DC

## 2013-10-06 NOTE — Telephone Encounter (Signed)
Omeprazole refill needed.  States [facility] has faxed request last week with no response.

## 2013-10-06 NOTE — Telephone Encounter (Signed)
No refill requests received for Omeprazole last week. Rx sent to pharmacy by escript

## 2013-10-13 ENCOUNTER — Other Ambulatory Visit: Payer: Self-pay | Admitting: *Deleted

## 2013-10-13 MED ORDER — ROSUVASTATIN CALCIUM 20 MG PO TABS: 20.0000 mg | ORAL_TABLET | Freq: Every day | ORAL | Status: DC

## 2013-10-13 NOTE — Telephone Encounter (Signed)
Refill Request  Premarin Vag

## 2013-10-13 NOTE — Telephone Encounter (Signed)
Pt call to get refill on her crestor and premarin  This is to go to [facility]  Dr fax # [phone] Phone # [phone] Nurse fax # [phone] Pt is completely out of meds

## 2013-10-14 ENCOUNTER — Other Ambulatory Visit: Payer: Self-pay | Admitting: Internal Medicine

## 2013-10-14 NOTE — Telephone Encounter (Signed)
[facility].  States rx for premarin 0.625 needed.  Fax [phone]

## 2013-10-15 DIAGNOSIS — L821 Other seborrheic keratosis: Secondary | ICD-10-CM | POA: Diagnosis not present

## 2013-10-15 DIAGNOSIS — L570 Actinic keratosis: Secondary | ICD-10-CM | POA: Diagnosis not present

## 2013-10-15 MED ORDER — ESTROGENS, CONJUGATED 0.625 MG/GM VA CREA: 1.0000 | TOPICAL_CREAM | Freq: Every day | VAGINAL | Status: DC

## 2013-10-15 NOTE — Telephone Encounter (Signed)
Can I have script for we were giving samples . I put script in just need your okay.

## 2013-10-17 ENCOUNTER — Telehealth: Payer: Self-pay | Admitting: Internal Medicine

## 2013-10-17 NOTE — Telephone Encounter (Signed)
Script sent for 90 days

## 2013-10-17 NOTE — Telephone Encounter (Signed)
The patient has questions about how her premarin was called into the pharmacy.

## 2013-10-21 ENCOUNTER — Telehealth: Payer: Self-pay | Admitting: *Deleted

## 2013-10-21 MED ORDER — DICLOFENAC SODIUM 75 MG PO TBEC: 75.0000 mg | DELAYED_RELEASE_TABLET | Freq: Two times a day (BID) | ORAL | Status: DC

## 2013-10-21 NOTE — Telephone Encounter (Signed)
Refill Request  Diclofenac Sodium 75 mg Dr   Myrtie Neither   Take one tablet by mouth twice daily

## 2013-10-21 NOTE — Telephone Encounter (Signed)
Not on med list

## 2013-10-21 NOTE — Telephone Encounter (Signed)
Was on historical drug list Ok to refill,  Refill sent

## 2013-11-10 ENCOUNTER — Telehealth: Payer: Self-pay | Admitting: Internal Medicine

## 2013-11-10 NOTE — Telephone Encounter (Signed)
Patient needs a 90 day supply of premarin vaginal cream called into [facility], she states that it is cheaper if you call it in for 90 days. Please advise when this is done.

## 2013-11-13 NOTE — Telephone Encounter (Signed)
Script called to pharmacy for 36 day supply pharmacy to mail the remaining balance to patient . Notified patient by voicemail per DPR.

## 2013-11-24 ENCOUNTER — Encounter: Payer: Self-pay | Admitting: Internal Medicine

## 2013-11-24 ENCOUNTER — Ambulatory Visit (INDEPENDENT_AMBULATORY_CARE_PROVIDER_SITE_OTHER): Payer: Medicare Other | Admitting: Internal Medicine

## 2013-11-24 VITALS — BP 128/68 | HR 59 | Temp 98.5°F | Resp 16 | Wt 163.5 lb

## 2013-11-24 DIAGNOSIS — G459 Transient cerebral ischemic attack, unspecified: Secondary | ICD-10-CM | POA: Diagnosis not present

## 2013-11-24 DIAGNOSIS — M129 Arthropathy, unspecified: Secondary | ICD-10-CM | POA: Diagnosis not present

## 2013-11-24 DIAGNOSIS — K2100 Gastro-esophageal reflux disease with esophagitis, without bleeding: Secondary | ICD-10-CM

## 2013-11-24 DIAGNOSIS — Z87891 Personal history of nicotine dependence: Secondary | ICD-10-CM

## 2013-11-24 DIAGNOSIS — F411 Generalized anxiety disorder: Secondary | ICD-10-CM | POA: Diagnosis not present

## 2013-11-24 DIAGNOSIS — M1990 Unspecified osteoarthritis, unspecified site: Secondary | ICD-10-CM

## 2013-11-24 MED ORDER — ROSUVASTATIN CALCIUM 20 MG PO TABS
20.0000 mg | ORAL_TABLET | Freq: Every day | ORAL | Status: DC
Start: 2013-11-24 — End: 2014-06-29

## 2013-11-24 MED ORDER — BUSPIRONE HCL 7.5 MG PO TABS: 7.5000 mg | ORAL_TABLET | Freq: Three times a day (TID) | ORAL | Status: DC

## 2013-11-24 MED ORDER — ROSUVASTATIN CALCIUM 20 MG PO TABS: 20.0000 mg | ORAL_TABLET | Freq: Every day | ORAL | Status: DC

## 2013-11-24 NOTE — Progress Notes (Signed)
Pre-visit discussion using our clinic review tool. No additional management support is needed unless otherwise documented below in the visit note.  

## 2013-11-24 NOTE — Patient Instructions (Addendum)
You can take diclofenac once or twice daily with breakfast and dinner . You can add  the tramadol up to 4 times daily as needed for arthritis pain   use the dicyclomine 30 minutes before  meals for post prandial diarrhea and cramping   Adding buspirone one tablet every 8 hours for anxiety.  This many work better and is safer than clonazepam but you can still take a clonazepam if needed for insomnia   I do not have Dr Virgina Evener last colonoscopy report.  He is not in the [facility] system   I will check with Dr. Olevia Perches about getting your esophageal study done prior to following up with her

## 2013-11-24 NOTE — Progress Notes (Signed)
Patient ID: Sabrina Montoya, female   DOB: [birth year], 73 y.o.   MRN: [id number]   Patient Active Problem List   Diagnosis Date Noted  . TIA (transient ischemic attack) 09/11/2013  . Dizziness and giddiness 09/02/2013  . Arrhythmia 07/01/2013  . Neuropathy 07/01/2013  . Overweight (BMI 25.0-29.9) 03/16/2013  . Anxiety state, unspecified 03/16/2013  . Chronic reflux esophagitis 12/29/2012  . Right hip pain 12/29/2012  . History of tobacco abuse 07/03/2012  . Hypomagnesemia 06/01/2012  . Bradycardia 06/01/2012  . CAD (coronary artery disease), autologous vein bypass graft   . Peripheral vascular disease   . Arthritis 02/29/2012  . Aneurysm of abdominal aorta 01/05/2012  . Asymptomatic carotid artery stenosis 01/05/2012  . Hyperlipidemia   . Osteopenia 08/29/2011  . Vitamin D deficiency 08/29/2011    Subjective:  CC:   Chief Complaint  Patient presents with  . Follow-up    medication    HPI:   Sabrina Montoya a 73 y.o. female who presents for Follow up on multiple medical issues.   She was referred to Dr. Olevia Perches for evaluation of persistent esophageal symptoms and during evaluation was sent to the ER for admission after reporting symptoms suggestive of TIA. The hospital admission to [facility] failed to reveal an acute stroke. She was discharged home on the same medications as she was taking prior to admission.   We discussed her presentation at length today. Symptoms occurred while driving her husband home from a funeral of one of her best friends. She developed acute onset of confusion while driving felt panicky and felt that she could not manage the on coming traffic. She had no loss of consciousness no labs in vision no slurred speech and no headache. In retrospect she believes her symptoms were due to a panic attack. The events surrounding her husband's diagnosis of glioblastoma multiforme and his prognosis and that of his friends with similar diagnoses has weighed heavily heavily on  her.   She is Not sleeping .  All meds tried thus far cause her to feel hung over .  Constantly watching Timmothy Sours to see if he is breathing when he is sleeping   Bilateral hip pain . she has been taking aking diclofenac for bilateral hip pain, which is worse at night. ,   GI. She continues to have multiple stools daily. Stools range from formed to loose. The loose stools appear to happen postprandially. She is not sure when her last colonoscopy was and we have had trouble getting a copy of it from her former gastroenterologist in [place].   She is willing to go by his office and request copies and person since attempts at getting them have been unsuccessful. She has been given a prescription for dicyclomine in the past to take but has not tried it due to confusion over her multiple medications. She continues to have episodes where it feels like her throat closes up. These are intermittent. She has had esophageal strictureS requiring dilation in the past. She is willing to proceed with the evaluation by Dr. Olevia Perches which per her note would include a barium study followed by an EGD.     Past Medical History  Diagnosis Date  . Diverticulitis of colon   . Rheumatic fever     possible at age 13  . Tobacco abuse   . Hyperlipidemia   . Abdominal aortic aneurysm without mention of rupture     infrarenal, stable, folllowed by Leotis Pain  . CAD (coronary artery disease), autologous  vein bypass graft   . Peripheral vascular disease     s/p CEA   . Tobacco abuse   . B12 deficiency   . Acute posthemorrhagic anemia   . Cardiac dysrhythmia, unspecified   . Neuropathy   . Reflux esophagitis   . Sliding hiatal hernia   . GERD (gastroesophageal reflux disease)   . Colon polyp     Past Surgical History  Procedure Laterality Date  . Closed manipulation shoulder      left shoulder post physical therapy, Right shoulder redo (toggle bolts)  . Rotator cuff repair  2002    right shoulder Dr. Nadean Corwin, [place] in [place]      Carotid stenosis found during workup for dysphagia,  [facility]  . Lumbar disc surgery  1971    L5, unssuccessful, fusion in 1983 successful (2 lumbar)  . Abdominal surgery  1976    for pain secondary to scar tissue, s/p apply  . 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 Hernia repair  may 2011    Dr. Pat Patrick  . Coronary artery bypass graft  2006    s/p triple bypass surgery, [facility]  . Cholecystectomy  2002    Dr. Pat Patrick  . Coronary angiography  01/2011    one occluded artery with collateralization, other arteries patent  . Hernia repair    . Appendectomy  1974       The following portions of the patient's history were reviewed and updated as appropriate: Allergies, current medications, and problem list.    Review of Systems:   13 Pt  review of systems was negative except those addressed in the HPI,     History   Social History  . Marital Status: Married    Spouse Name: N/A    Number of Children: N/A  . Years of Education: N/A   Occupational History  . Not on file.   Social History Main Topics  . Smoking status: Former Smoker -- 0.50 packs/day for 30 years    Types: Cigarettes    Quit date: 04/01/2012  . Smokeless tobacco: Never Used     Comment: quit for 2 years after sinus infection and 6 months after heart surgery  . Alcohol Use: No  . Drug Use: No  . Sexual Activity: Not on file   Other Topics Concern  . Not on file   Social History Narrative   Lives with spouse [name]    Objective:  Filed Vitals:   11/24/13 1430  BP: 128/68  Pulse: 59  Temp: 98.5 F (36.9 C)  Resp: 16     General appearance: alert, cooperative and appears stated age Ears: normal TM's and external ear canals both ears Throat: lips, mucosa, and tongue normal; teeth and gums normal Neck: no adenopathy, no carotid bruit, supple, symmetrical, trachea midline and thyroid not enlarged,  symmetric, no tenderness/mass/nodules Back: symmetric, no curvature. ROM normal. No CVA tenderness. Lungs: clear to auscultation bilaterally Heart: regular rate and rhythm, S1, S2 normal, no murmur, click, rub or gallop Abdomen: soft, non-tender; bowel sounds normal; no masses,  no organomegaly Pulses: 2+ and symmetric Skin: Skin color, texture, turgor normal. No rashes or lesions Lymph nodes: Cervical, supraclavicular, and axillary nodes normal.  Assessment and Plan:  Anxiety state, unspecified With recent panic attack resulting admission to to [facility] for workup for TIA.  She has gotten no relief from use of clonazepam  and all other medications for insomnia  cause her to feel drugged. Trial of buspirone suggested.Return in one month.   Arthritis She has bilateral hip pain, right greater than left due to osteoarthritis. Plain films have been done in January 2014 at orthopedic clinic showing mild joint space narrowing. These have been reportedly repeated do show progression. . Continue tramadol and diclofenac. follow up with orthopedics if pain persists.  Asymptomatic carotid artery stenosis Repeat evaluation during admission for TIA showed no progression of disease. Continue aspirin and statin. Her cholesterol is well-controlled, however Crestor is no longer covered favorably by her insurance. We discussed switching to  Lipitor 80 mg if the co-pay cards they do not defray  cost.   Chronic reflux esophagitis With history of stricture formation. Her evaluation by Dr. Olevia Perches was interrupted by alarming symptoms concerning for TIA which turned out to be panic disorder/ panic attack.  I will discuss with Dr. Olevia Perches whether patient needs to return to her prior to ordering a barium swallow whether I can order this in advance.  History of tobacco abuse She has been abstinent for 2 years, since the diagnosis of her husband with glioblastoma multiform A. Encouragement given.  A total of 40  minutes was spent with patient more than half of which was spent in counseling, reviewing records from other prviders and coordination of care.  Updated Medication List Outpatient Encounter Prescriptions as of 11/24/2013  Medication Sig  . albuterol (PROVENTIL HFA;VENTOLIN HFA) 108 (90 BASE) MCG/ACT inhaler Inhale 1 puff into the lungs daily as needed.  Marland Kitchen aspirin 325 MG tablet Take 1 tablet (325 mg total) by mouth daily.  . cholecalciferol (VITAMIN D) 1000 UNITS tablet Take 1,000 Units by mouth daily.  . clonazePAM (KLONOPIN) 0.5 MG tablet Take 1 tablet (0.5 mg total) by mouth 2 (two) times daily as needed.  . conjugated estrogens (PREMARIN) vaginal cream Place 1 Applicatorful vaginally at bedtime.  . diclofenac (VOLTAREN) 75 MG EC tablet Take 1 tablet (75 mg total) by mouth 2 (two) times daily.  Marland Kitchen dicyclomine (BENTYL) 20 MG tablet Take 20 mg by mouth 2 (two) times daily.  Marland Kitchen estradiol (ESTRACE) 0.1 MG/GM vaginal cream Use nightly for two weeks,  Then twice weekly thereafter  . metoprolol tartrate (LOPRESSOR) 25 MG tablet Take 0.5 tablets (12.5 mg total) by mouth 2 (two) times daily.  . nitroGLYCERIN (NITROSTAT) 0.4 MG SL tablet Place 0.4 mg under the tongue every 5 (five) minutes as needed. As needed for chest pain   . omeprazole (PRILOSEC) 40 MG capsule Take 1 capsule (40 mg total) by mouth 2 (two) times daily.  . polyethylene glycol (MIRALAX / GLYCOLAX) packet Take 17 g by mouth daily.  . rosuvastatin (CRESTOR) 20 MG tablet Take 1 tablet (20 mg total) by mouth daily.  . rosuvastatin (CRESTOR) 20 MG tablet Take 1 tablet (20 mg total) by mouth daily.  . [DISCONTINUED] rosuvastatin (CRESTOR) 20 MG tablet Take 1 tablet (20 mg total) by mouth daily.  Marland Kitchen albuterol (PROVENTIL HFA;VENTOLIN HFA) 108 (90 BASE) MCG/ACT inhaler Inhale 2 puffs into the lungs every 6 (six) hours as needed for wheezing.  . busPIRone (BUSPAR) 7.5 MG tablet Take 1 tablet (7.5 mg total) by mouth 3 (three) times daily.  .  traMADol (ULTRAM) 50 MG tablet Take 1 tablet (50 mg total) by mouth every 8 (eight) hours as needed for pain.     No orders of the defined types were placed in this encounter.    No Follow-up  on file.

## 2013-11-25 ENCOUNTER — Telehealth: Payer: Self-pay | Admitting: *Deleted

## 2013-11-25 ENCOUNTER — Encounter: Payer: Self-pay | Admitting: Internal Medicine

## 2013-11-25 NOTE — Telephone Encounter (Signed)
Sabrina Montoya, could you, please, set up pt for appointment with me in nex 3 weeks.Thanx DB       Patient scheduled on 12/10/13 at 2:30 PM.

## 2013-11-25 NOTE — Assessment & Plan Note (Signed)
With recent panic attack resulting admission to to [facility] for workup for TIA.  She has gotten no relief from use of clonazepam and all other medications for insomnia  cause her to feel drugged. Trial of buspirone suggested.Return in one month.

## 2013-11-25 NOTE — Assessment & Plan Note (Signed)
With history of stricture formation. Her evaluation by Dr. Olevia Perches was interrupted by alarming symptoms concerning for TIA which turned out to be panic disorder/ panic attack.  I will discuss with Dr. Olevia Perches whether patient needs to return to her prior to ordering a barium swallow whether I can order this in advance.

## 2013-11-25 NOTE — Progress Notes (Signed)
Hello Sabrina Montoya,, thanks for letting me know. Please schedule Barium esophagram " dysphagia, r/o dismotility". We will call her with an appointment. Thanx! Lynn Ito, could you, please, set up pt for appointment with me in nex 3 weeks.Thanx DB

## 2013-11-25 NOTE — Assessment & Plan Note (Signed)
She has been abstinent for 2 years, since the diagnosis of her husband with glioblastoma multiform A. Encouragement given.

## 2013-11-25 NOTE — Assessment & Plan Note (Addendum)
Repeat evaluation during admission for TIA showed no progression of disease. Continue aspirin and statin. Her cholesterol is well-controlled, however Crestor is no longer covered favorably by her insurance. We discussed switching to  Lipitor 80 mg if the co-pay cards they do not defray  cost.

## 2013-11-25 NOTE — Assessment & Plan Note (Addendum)
She has bilateral hip pain, right greater than left due to osteoarthritis. Plain films have been done in January 2014 at orthopedic clinic showing mild joint space narrowing. These have been reportedly repeated do show progression. . Continue tramadol and diclofenac. follow up with orthopedics if pain persists.

## 2013-12-04 ENCOUNTER — Telehealth: Payer: Self-pay | Admitting: Internal Medicine

## 2013-12-05 ENCOUNTER — Other Ambulatory Visit: Payer: Self-pay | Admitting: *Deleted

## 2013-12-05 MED ORDER — DICLOFENAC SODIUM 75 MG PO TBEC: 75.0000 mg | DELAYED_RELEASE_TABLET | Freq: Two times a day (BID) | ORAL | Status: DC

## 2013-12-10 ENCOUNTER — Ambulatory Visit: Payer: Medicare Other | Admitting: Internal Medicine

## 2014-01-12 ENCOUNTER — Other Ambulatory Visit: Payer: Self-pay | Admitting: *Deleted

## 2014-01-12 DIAGNOSIS — F419 Anxiety disorder, unspecified: Secondary | ICD-10-CM

## 2014-01-12 NOTE — Telephone Encounter (Signed)
Last visit 11/24/13, ok refill?

## 2014-01-14 ENCOUNTER — Other Ambulatory Visit: Payer: Self-pay | Admitting: *Deleted

## 2014-01-14 MED ORDER — CLONAZEPAM 0.5 MG PO TABS: 0.5000 mg | ORAL_TABLET | Freq: Two times a day (BID) | ORAL | Status: DC | PRN

## 2014-01-14 NOTE — Telephone Encounter (Signed)
Refill

## 2014-01-14 NOTE — Telephone Encounter (Signed)
Ok to refill,  printed rx  

## 2014-01-16 NOTE — Telephone Encounter (Signed)
Script phoned in

## 2014-01-19 MED ORDER — DICLOFENAC SODIUM 75 MG PO TBEC: 75.0000 mg | DELAYED_RELEASE_TABLET | Freq: Two times a day (BID) | ORAL | Status: DC

## 2014-01-19 NOTE — Telephone Encounter (Signed)
Ok to refill,  Authorized in epic 

## 2014-01-29 DIAGNOSIS — Z86010 Personal history of colonic polyps: Secondary | ICD-10-CM | POA: Diagnosis not present

## 2014-01-29 DIAGNOSIS — R1310 Dysphagia, unspecified: Secondary | ICD-10-CM | POA: Diagnosis not present

## 2014-02-09 ENCOUNTER — Telehealth: Payer: Self-pay | Admitting: Internal Medicine

## 2014-02-09 NOTE — Telephone Encounter (Signed)
Pt states she has not been able to get her Crestor again.  States they are wanting to charge her $200.00+.  States she has received a letter from insurance as well.  Would like to discuss this with nurse to see if there is anything she can do.  Also asking if she can get samples until the problem is resolved.

## 2014-02-09 NOTE — Telephone Encounter (Signed)
Patient notified that have placed samples of Crestor up front for patient pick up. Patient ask if we could try for tier reduction on Crestor? Tier reduction form placed in red folder.

## 2014-02-13 ENCOUNTER — Ambulatory Visit (INDEPENDENT_AMBULATORY_CARE_PROVIDER_SITE_OTHER): Payer: Medicare Other | Admitting: Internal Medicine

## 2014-02-13 ENCOUNTER — Encounter: Payer: Self-pay | Admitting: Internal Medicine

## 2014-02-13 VITALS — BP 138/70 | HR 64 | Temp 98.3°F | Resp 16 | Wt 158.0 lb

## 2014-02-13 DIAGNOSIS — R0989 Other specified symptoms and signs involving the circulatory and respiratory systems: Secondary | ICD-10-CM

## 2014-02-13 DIAGNOSIS — R002 Palpitations: Secondary | ICD-10-CM

## 2014-02-13 DIAGNOSIS — F458 Other somatoform disorders: Secondary | ICD-10-CM | POA: Diagnosis not present

## 2014-02-13 DIAGNOSIS — R198 Other specified symptoms and signs involving the digestive system and abdomen: Secondary | ICD-10-CM

## 2014-02-13 LAB — MAGNESIUM: MAGNESIUM: 1.1 mg/dL — AB (ref 1.5–2.5)

## 2014-02-13 LAB — BASIC METABOLIC PANEL
BUN: 31 mg/dL — ABNORMAL HIGH (ref 6–23)
CALCIUM: 8.9 mg/dL (ref 8.4–10.5)
CHLORIDE: 106 meq/L (ref 96–112)
CO2: 25 mEq/L (ref 19–32)
CREATININE: 1.2 mg/dL (ref 0.4–1.2)
GFR: 48.37 mL/min — ABNORMAL LOW (ref >60.00)
GLUCOSE: 95 mg/dL (ref 70–99)
Potassium: 4.3 mEq/L (ref 3.5–5.1)
Sodium: 142 mEq/L (ref 135–145)

## 2014-02-13 LAB — TSH: TSH: 1.87 u[IU]/mL (ref 0.35–5.50)

## 2014-02-13 NOTE — Progress Notes (Signed)
Pre-visit discussion using our clinic review tool. No additional management support is needed unless otherwise documented below in the visit note.  

## 2014-02-13 NOTE — Progress Notes (Signed)
Patient ID: Durenda Pechacek, female   DOB: 1941/04/15, 73 y.o.   MRN: [id number]  Patient Active Problem List   Diagnosis Date Noted  . Globus sensation 02/15/2014  . TIA (transient ischemic attack) 09/11/2013  . Dizziness and giddiness 09/02/2013  . Arrhythmia 07/01/2013  . Neuropathy 07/01/2013  . Overweight (BMI 25.0-29.9) 03/16/2013  . Anxiety state, unspecified 03/16/2013  . Chronic reflux esophagitis 12/29/2012  . Right hip pain 12/29/2012  . History of tobacco abuse 07/03/2012  . Hypomagnesemia 06/01/2012  . Bradycardia 06/01/2012  . CAD (coronary artery disease), autologous vein bypass graft   . Peripheral vascular disease   . Arthritis 02/29/2012  . Aneurysm of abdominal aorta 01/05/2012  . Asymptomatic carotid artery stenosis 01/05/2012  . Hyperlipidemia   . Osteopenia 08/29/2011  . Vitamin D deficiency 08/29/2011    Subjective:  CC:   Chief Complaint  Patient presents with  . Acute Visit    palpitations    HPI:   Mikayla Chiusano is a 73 y.o. female who presents for Palpitations and funny feeling in chest   Having episodes  of strange feeling in her chest,  which occur randomly, usually when at rest or eating.  Not choking or coughing .  No regurgitation or reflux. Satya suffered the  loss of husband [name] to glioblastoma a month ago, after battling it for nearly a year.  He died on  on 27-Jan-2023.  She has been  angry at the staff of the cancer center at [facility] for not preparing her for his death, because she apparently was not aware of his prognosis being so poor.  She is not sleeping well.    Past Medical History  Diagnosis Date  . Diverticulitis of colon   . Rheumatic fever     possible at age 42  . Tobacco abuse   . Hyperlipidemia   . Abdominal aortic aneurysm without mention of rupture     infrarenal, stable, folllowed by Leotis Pain  . CAD (coronary artery disease), autologous vein bypass graft   . Peripheral vascular disease     s/p CEA   . Tobacco abuse   . B12  deficiency   . Acute posthemorrhagic anemia   . Cardiac dysrhythmia, unspecified   . Neuropathy   . Reflux esophagitis   . Sliding hiatal hernia   . GERD (gastroesophageal reflux disease)   . Colon polyp     Past Surgical History  Procedure Laterality Date  . Closed manipulation shoulder      left shoulder post physical therapy, Right shoulder redo (toggle bolts)  . Rotator cuff repair  2002    right shoulder Dr. Nadean Corwin, [place] in [place]      Carotid stenosis found during workup for dysphagia,  [facility]  . Lumbar disc surgery  1971    L5, unssuccessful, fusion in 1983 successful (2 lumbar)  . Abdominal surgery  1976    for pain secondary to scar tissue, s/p apply  . 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 Hernia repair  may 2011    Dr. Pat Patrick  . Coronary artery bypass graft  2006    s/p triple bypass surgery, [facility]  . Cholecystectomy  2002    Dr. Pat Patrick  . Coronary angiography  01/2011    one occluded artery with collateralization, other arteries patent  . Hernia repair    . Appendectomy  1974  The following portions of the patient's history were reviewed and updated as appropriate: Allergies, current medications, and problem list.    Review of Systems:   Patient denies headache, fevers, malaise, unintentional weight loss, skin rash, eye pain, sinus congestion and sinus pain, sore throat, dysphagia,  hemoptysis , cough, dyspnea, wheezing, chest pain, palpitations, orthopnea, edema, abdominal pain, nausea, melena, diarrhea, constipation, flank pain, dysuria, hematuria, urinary  Frequency, nocturia, numbness, tingling, seizures,  Focal weakness, Loss of consciousness,  Tremor, insomnia, depression, anxiety, and suicidal ideation.     History   Social History  . Marital Status: Widowed    Spouse Name: N/A    Number of Children: N/A  . Years of Education: N/A   Occupational History   . Not on file.   Social History Main Topics  . Smoking status: Former Smoker -- 0.50 packs/day for 30 years    Types: Cigarettes    Quit date: 04/01/2012  . Smokeless tobacco: Never Used     Comment: quit for 2 years after sinus infection and 6 months after heart surgery  . Alcohol Use: No  . Drug Use: No  . Sexual Activity: Not on file   Other Topics Concern  . Not on file   Social History Narrative   Lives with spouse [name]    Objective:  Filed Vitals:   02/13/14 1039  BP: 138/70  Pulse: 64  Temp: 98.3 F (36.8 C)  Resp: 16     General appearance: alert, cooperative and appears stated age Ears: normal TM's and external ear canals both ears Throat: lips, mucosa, and tongue normal; teeth and gums normal Neck: no adenopathy, no carotid bruit, supple, symmetrical, trachea midline and thyroid not enlarged, symmetric, no tenderness/mass/nodules Back: symmetric, no curvature. ROM normal. No CVA tenderness. Lungs: clear to auscultation bilaterally Heart: regular rate and rhythm, S1, S2 normal, no murmur, click, rub or gallop Abdomen: soft, non-tender; bowel sounds normal; no masses,  no organomegaly Pulses: 2+ and symmetric Skin: Skin color, texture, turgor normal. No rashes or lesions Lymph nodes: Cervical, supraclavicular, and axillary nodes normal.  Assessment and Plan:  Globus sensation Suggested by vague history .  EKG is normal and exam is normal.  She has had cardiology follow up recently and she is not describing true palpitations or anginal symptoms,  Is under extreme stress due to loss of husband.   Trial of dicyclomine and continue prn clonazepam   Updated Medication List Outpatient Encounter Prescriptions as of 02/13/2014  Medication Sig  . albuterol (PROVENTIL HFA;VENTOLIN HFA) 108 (90 BASE) MCG/ACT inhaler Inhale 1 puff into the lungs daily as needed.  Marland Kitchen aspirin 325 MG tablet Take 1 tablet (325 mg total) by mouth daily.  . busPIRone (BUSPAR) 7.5 MG  tablet Take 1 tablet (7.5 mg total) by mouth 3 (three) times daily.  . cholecalciferol (VITAMIN D) 1000 UNITS tablet Take 1,000 Units by mouth daily.  . clonazePAM (KLONOPIN) 0.5 MG tablet Take 1 tablet (0.5 mg total) by mouth 2 (two) times daily as needed.  . conjugated estrogens (PREMARIN) vaginal cream Place 1 Applicatorful vaginally at bedtime.  . diclofenac (VOLTAREN) 75 MG EC tablet Take 1 tablet (75 mg total) by mouth 2 (two) times daily.  Marland Kitchen dicyclomine (BENTYL) 20 MG tablet Take 20 mg by mouth 2 (two) times daily.  . metoprolol tartrate (LOPRESSOR) 25 MG tablet Take 0.5 tablets (12.5 mg total) by mouth 2 (two) times daily.  . nitroGLYCERIN (NITROSTAT) 0.4 MG SL tablet Place 0.4 mg under  the tongue every 5 (five) minutes as needed. As needed for chest pain   . omeprazole (PRILOSEC) 40 MG capsule Take 1 capsule (40 mg total) by mouth 2 (two) times daily.  . rosuvastatin (CRESTOR) 20 MG tablet Take 1 tablet (20 mg total) by mouth daily.  . rosuvastatin (CRESTOR) 20 MG tablet Take 1 tablet (20 mg total) by mouth daily.  Marland Kitchen albuterol (PROVENTIL HFA;VENTOLIN HFA) 108 (90 BASE) MCG/ACT inhaler Inhale 2 puffs into the lungs every 6 (six) hours as needed for wheezing.  Marland Kitchen estradiol (ESTRACE) 0.1 MG/GM vaginal cream Use nightly for two weeks,  Then twice weekly thereafter  . polyethylene glycol (MIRALAX / GLYCOLAX) packet Take 17 g by mouth daily.  . traMADol (ULTRAM) 50 MG tablet Take 1 tablet (50 mg total) by mouth every 8 (eight) hours as needed for pain.     Orders Placed This Encounter  Procedures  . Magnesium  . Basic metabolic panel  . TSH  . EKG 12-Lead    No Follow-up on file.

## 2014-02-15 DIAGNOSIS — R0989 Other specified symptoms and signs involving the circulatory and respiratory systems: Secondary | ICD-10-CM | POA: Insufficient documentation

## 2014-02-15 DIAGNOSIS — R198 Other specified symptoms and signs involving the digestive system and abdomen: Secondary | ICD-10-CM | POA: Insufficient documentation

## 2014-02-15 NOTE — Assessment & Plan Note (Addendum)
Suggested by vague history .  EKG is normal and exam is normal.  She has had cardiology follow up recently and she is not describing true palpitations or anginal symptoms,  Is under extreme stress due to loss of husband.   Trial of dicyclomine and continue prn clonazepam

## 2014-02-16 ENCOUNTER — Other Ambulatory Visit: Payer: Self-pay | Admitting: *Deleted

## 2014-02-16 MED ORDER — MAGNESIUM OXIDE 400 (241.3 MG) MG PO TABS: 400.0000 mg | ORAL_TABLET | Freq: Two times a day (BID) | ORAL | Status: DC

## 2014-02-17 DIAGNOSIS — I6529 Occlusion and stenosis of unspecified carotid artery: Secondary | ICD-10-CM | POA: Diagnosis not present

## 2014-02-18 ENCOUNTER — Encounter: Payer: Self-pay | Admitting: *Deleted

## 2014-02-18 ENCOUNTER — Telehealth: Payer: Self-pay | Admitting: Internal Medicine

## 2014-02-18 NOTE — Telephone Encounter (Signed)
Called Patient and notified patient to vbe at Cancer center at 8.30 am on 02/22/14. Patient confirmed she would be there.

## 2014-02-18 NOTE — Progress Notes (Signed)
Called [facility] center spoke with [facility] staed waiting to hear from Dr. Getting on whether Magnesium can be done in clinic, will advise asap.

## 2014-02-19 ENCOUNTER — Ambulatory Visit: Payer: Self-pay | Admitting: Internal Medicine

## 2014-02-19 DIAGNOSIS — I2510 Atherosclerotic heart disease of native coronary artery without angina pectoris: Secondary | ICD-10-CM | POA: Diagnosis not present

## 2014-02-19 DIAGNOSIS — Z79899 Other long term (current) drug therapy: Secondary | ICD-10-CM | POA: Diagnosis not present

## 2014-02-19 DIAGNOSIS — F411 Generalized anxiety disorder: Secondary | ICD-10-CM | POA: Diagnosis not present

## 2014-02-19 DIAGNOSIS — K219 Gastro-esophageal reflux disease without esophagitis: Secondary | ICD-10-CM | POA: Diagnosis not present

## 2014-02-20 DIAGNOSIS — Z79899 Other long term (current) drug therapy: Secondary | ICD-10-CM | POA: Diagnosis not present

## 2014-02-20 LAB — POTASSIUM: POTASSIUM: 4.6 mmol/L (ref 3.5–5.1)

## 2014-02-20 LAB — MAGNESIUM: MAGNESIUM: 1.4 mg/dL — AB

## 2014-02-23 ENCOUNTER — Telehealth: Payer: Self-pay | Admitting: Internal Medicine

## 2014-02-23 NOTE — Telephone Encounter (Signed)
Patient stated these episodes only happen when she is trying to eat and feels like she is going to faint and chest feels tight as previous note states has had one magnesium infusion and two moore scheduled. Patient had this episode at breakfast and called a friend over because she was concerned she might faint when friend arrived patient went back to bed and felt better upon waking until she tried to eat.

## 2014-02-23 NOTE — Telephone Encounter (Signed)
The patient has been taking magnesium tablets and she has had a magnesium infusion on Friday and she feels like she is going to pass out. She has two more infusion scheduled on on Thursday 4.23.15 and 4.30.15.

## 2014-02-23 NOTE — Telephone Encounter (Signed)
Spoke with pt to notify her of Dr. Lupita Dawn comments. Pt verbalized understanding and agreed to follow up with Cardiologist.

## 2014-02-23 NOTE — Telephone Encounter (Signed)
Left message for patient to return call to office. 

## 2014-02-23 NOTE — Telephone Encounter (Signed)
She needs to see her cardiologist for a Holter monitor. I do not have access to one here. Has she called her cardiologist?

## 2014-02-26 LAB — MAGNESIUM: Magnesium: 1.8 mg/dL

## 2014-03-02 DIAGNOSIS — H2510 Age-related nuclear cataract, unspecified eye: Secondary | ICD-10-CM | POA: Diagnosis not present

## 2014-03-03 DIAGNOSIS — I6529 Occlusion and stenosis of unspecified carotid artery: Secondary | ICD-10-CM | POA: Diagnosis not present

## 2014-03-03 DIAGNOSIS — G5620 Lesion of ulnar nerve, unspecified upper limb: Secondary | ICD-10-CM | POA: Diagnosis not present

## 2014-03-04 DIAGNOSIS — N8189 Other female genital prolapse: Secondary | ICD-10-CM | POA: Diagnosis not present

## 2014-03-05 DIAGNOSIS — R079 Chest pain, unspecified: Secondary | ICD-10-CM | POA: Diagnosis not present

## 2014-03-05 LAB — MAGNESIUM: Magnesium: 1.7 mg/dL — ABNORMAL LOW

## 2014-03-06 ENCOUNTER — Ambulatory Visit: Payer: Self-pay | Admitting: Internal Medicine

## 2014-03-06 DIAGNOSIS — M899 Disorder of bone, unspecified: Secondary | ICD-10-CM | POA: Diagnosis not present

## 2014-03-06 DIAGNOSIS — I2510 Atherosclerotic heart disease of native coronary artery without angina pectoris: Secondary | ICD-10-CM | POA: Diagnosis not present

## 2014-03-06 DIAGNOSIS — K219 Gastro-esophageal reflux disease without esophagitis: Secondary | ICD-10-CM | POA: Diagnosis not present

## 2014-03-06 DIAGNOSIS — E785 Hyperlipidemia, unspecified: Secondary | ICD-10-CM | POA: Diagnosis not present

## 2014-03-06 DIAGNOSIS — Z79899 Other long term (current) drug therapy: Secondary | ICD-10-CM | POA: Diagnosis not present

## 2014-03-06 DIAGNOSIS — R55 Syncope and collapse: Secondary | ICD-10-CM | POA: Diagnosis not present

## 2014-03-06 DIAGNOSIS — F411 Generalized anxiety disorder: Secondary | ICD-10-CM | POA: Diagnosis not present

## 2014-03-09 DIAGNOSIS — R002 Palpitations: Secondary | ICD-10-CM | POA: Diagnosis not present

## 2014-03-09 DIAGNOSIS — R55 Syncope and collapse: Secondary | ICD-10-CM | POA: Diagnosis not present

## 2014-03-09 DIAGNOSIS — I10 Essential (primary) hypertension: Secondary | ICD-10-CM | POA: Diagnosis not present

## 2014-03-09 DIAGNOSIS — I259 Chronic ischemic heart disease, unspecified: Secondary | ICD-10-CM | POA: Diagnosis not present

## 2014-03-11 ENCOUNTER — Ambulatory Visit: Payer: Self-pay | Admitting: Gastroenterology

## 2014-03-11 DIAGNOSIS — R1310 Dysphagia, unspecified: Secondary | ICD-10-CM | POA: Diagnosis not present

## 2014-03-11 LAB — MAGNESIUM: Magnesium: 1.7 mg/dL — ABNORMAL LOW

## 2014-03-11 LAB — POTASSIUM: POTASSIUM: 4.9 mmol/L (ref 3.5–5.1)

## 2014-03-13 DIAGNOSIS — N949 Unspecified condition associated with female genital organs and menstrual cycle: Secondary | ICD-10-CM | POA: Diagnosis not present

## 2014-03-13 DIAGNOSIS — R002 Palpitations: Secondary | ICD-10-CM | POA: Diagnosis not present

## 2014-03-16 DIAGNOSIS — I2510 Atherosclerotic heart disease of native coronary artery without angina pectoris: Secondary | ICD-10-CM | POA: Diagnosis not present

## 2014-03-16 DIAGNOSIS — R079 Chest pain, unspecified: Secondary | ICD-10-CM | POA: Diagnosis not present

## 2014-03-18 ENCOUNTER — Telehealth: Payer: Self-pay | Admitting: *Deleted

## 2014-03-18 NOTE — Telephone Encounter (Signed)
Yes, it will not hurt her even if her mag level is normal.

## 2014-03-18 NOTE — Telephone Encounter (Signed)
Patient ask if she is to continue the magnesium supplement?

## 2014-03-18 NOTE — Telephone Encounter (Signed)
Patient notified

## 2014-03-19 DIAGNOSIS — Z79899 Other long term (current) drug therapy: Secondary | ICD-10-CM | POA: Diagnosis not present

## 2014-03-19 LAB — MAGNESIUM: Magnesium: 1.5 mg/dL — ABNORMAL LOW

## 2014-03-24 DIAGNOSIS — R197 Diarrhea, unspecified: Secondary | ICD-10-CM | POA: Diagnosis not present

## 2014-03-24 DIAGNOSIS — R198 Other specified symptoms and signs involving the digestive system and abdomen: Secondary | ICD-10-CM | POA: Diagnosis not present

## 2014-03-24 DIAGNOSIS — R109 Unspecified abdominal pain: Secondary | ICD-10-CM | POA: Diagnosis not present

## 2014-03-24 DIAGNOSIS — R1310 Dysphagia, unspecified: Secondary | ICD-10-CM | POA: Diagnosis not present

## 2014-03-26 LAB — MAGNESIUM: Magnesium: 1.8 mg/dL

## 2014-03-27 ENCOUNTER — Other Ambulatory Visit: Payer: Self-pay | Admitting: Internal Medicine

## 2014-04-03 ENCOUNTER — Telehealth: Payer: Self-pay | Admitting: Internal Medicine

## 2014-04-03 NOTE — Telephone Encounter (Signed)
Scheduled appointment

## 2014-04-03 NOTE — Telephone Encounter (Signed)
Patient is continuing magnesium infusion, and would like to know why her magnesium is continually dropping and has to continue infusion. Tried to talk with Dr. Inez Pilgrim concerning hypomagnesium and did not get an answer except this happens and until we can get the levels up she needs to continue the infusions patient would like to know why it is dropping.

## 2014-04-03 NOTE — Telephone Encounter (Signed)
Not something I can answer in a phone message.  Will discuss at next appt.

## 2014-04-06 ENCOUNTER — Ambulatory Visit: Payer: Self-pay | Admitting: Internal Medicine

## 2014-04-06 DIAGNOSIS — E785 Hyperlipidemia, unspecified: Secondary | ICD-10-CM | POA: Diagnosis not present

## 2014-04-06 DIAGNOSIS — K219 Gastro-esophageal reflux disease without esophagitis: Secondary | ICD-10-CM | POA: Diagnosis not present

## 2014-04-06 DIAGNOSIS — F411 Generalized anxiety disorder: Secondary | ICD-10-CM | POA: Diagnosis not present

## 2014-04-06 DIAGNOSIS — M899 Disorder of bone, unspecified: Secondary | ICD-10-CM | POA: Diagnosis not present

## 2014-04-06 DIAGNOSIS — Z79899 Other long term (current) drug therapy: Secondary | ICD-10-CM | POA: Diagnosis not present

## 2014-04-06 DIAGNOSIS — I2510 Atherosclerotic heart disease of native coronary artery without angina pectoris: Secondary | ICD-10-CM | POA: Diagnosis not present

## 2014-04-06 DIAGNOSIS — R55 Syncope and collapse: Secondary | ICD-10-CM | POA: Diagnosis not present

## 2014-04-06 DIAGNOSIS — M949 Disorder of cartilage, unspecified: Secondary | ICD-10-CM | POA: Diagnosis not present

## 2014-04-06 DIAGNOSIS — D649 Anemia, unspecified: Secondary | ICD-10-CM | POA: Diagnosis not present

## 2014-04-06 LAB — MAGNESIUM: MAGNESIUM: 1.4 mg/dL — AB

## 2014-04-17 DIAGNOSIS — Z79899 Other long term (current) drug therapy: Secondary | ICD-10-CM | POA: Diagnosis not present

## 2014-04-17 LAB — CANCER CENTER HEMOGLOBIN: HGB: 10.6 g/dL — ABNORMAL LOW (ref 12.0–16.0)

## 2014-04-17 LAB — MAGNESIUM: MAGNESIUM: 1.5 mg/dL — AB

## 2014-04-17 LAB — POTASSIUM: Potassium: 4.6 mmol/L (ref 3.5–5.1)

## 2014-04-20 ENCOUNTER — Other Ambulatory Visit: Payer: Self-pay | Admitting: Internal Medicine

## 2014-04-20 NOTE — Telephone Encounter (Signed)
Last refill 3.16.15, last OV 4.10.15.  Please advise refill.

## 2014-04-22 ENCOUNTER — Encounter: Payer: Self-pay | Admitting: Internal Medicine

## 2014-04-22 ENCOUNTER — Ambulatory Visit (INDEPENDENT_AMBULATORY_CARE_PROVIDER_SITE_OTHER): Payer: Medicare Other | Admitting: Internal Medicine

## 2014-04-22 DIAGNOSIS — K2100 Gastro-esophageal reflux disease with esophagitis, without bleeding: Secondary | ICD-10-CM

## 2014-04-22 DIAGNOSIS — R198 Other specified symptoms and signs involving the digestive system and abdomen: Secondary | ICD-10-CM

## 2014-04-22 DIAGNOSIS — G459 Transient cerebral ischemic attack, unspecified: Secondary | ICD-10-CM

## 2014-04-22 DIAGNOSIS — R0989 Other specified symptoms and signs involving the circulatory and respiratory systems: Secondary | ICD-10-CM

## 2014-04-22 DIAGNOSIS — R42 Dizziness and giddiness: Secondary | ICD-10-CM | POA: Diagnosis not present

## 2014-04-22 DIAGNOSIS — F458 Other somatoform disorders: Secondary | ICD-10-CM | POA: Diagnosis not present

## 2014-04-22 DIAGNOSIS — F411 Generalized anxiety disorder: Secondary | ICD-10-CM

## 2014-04-22 NOTE — Progress Notes (Signed)
Pre-visit discussion using our clinic review tool. No additional management support is needed unless otherwise documented below in the visit note.  

## 2014-04-22 NOTE — Patient Instructions (Addendum)
Your low magnesium may be due to taking omeprazole  For so many syyears  Since you do not have reflux,  Let's top the omeprazole.  You can use famotidine (pepcid ) AS NEEDED if you develop indigestion or sour stomach  Continue the twice daily mg supplements   Try eliminating the evening dose of clonazepam daily .  You may not need it since you started the buspirone for anxiety .  Go back to Dr Chryl Heck and ask him to  Place a Cardionet on you

## 2014-04-22 NOTE — Progress Notes (Signed)
Patient ID: Sabrina Montoya, female   DOB: 1941-05-10, 73 y.o.   MRN: [id number]   Patient Active Problem List   Diagnosis Date Noted  . Globus sensation 02/15/2014  . TIA (transient ischemic attack) 09/11/2013  . Dizziness and giddiness 09/02/2013  . Arrhythmia 07/01/2013  . Neuropathy 07/01/2013  . Overweight (BMI 25.0-29.9) 03/16/2013  . Anxiety state, unspecified 03/16/2013  . Chronic reflux esophagitis 12/29/2012  . Right hip pain 12/29/2012  . History of tobacco abuse 07/03/2012  . Hypomagnesemia 06/01/2012  . Bradycardia 06/01/2012  . CAD (coronary artery disease), autologous vein bypass graft   . Peripheral vascular disease   . Arthritis 02/29/2012  . Aneurysm of abdominal aorta 01/05/2012  . Asymptomatic carotid artery stenosis 01/05/2012  . Hyperlipidemia   . Osteopenia 08/29/2011  . Vitamin D deficiency 08/29/2011    Subjective:  CC:   Chief Complaint  Patient presents with  . Follow-up    on magnesium, up coming colonoscopy.    HPI:   Sabrina Montoya is a 73 y.o. female who presents for FOLLOW UP ON RECURRENT HYPOMAGNESEMIA and recurrent presyncope.   Her most recent Mg level was 1.1 despite reported compliance with oral supplements and she underwent Mg infusion at the [facility] which required several hours .  She is reustrated at having to do this repeatedly  .  med list reviewed.  She has been taking omeprazole  Chronically for years with no signs of reflux on most recent GI study for dsyphagia in February .  She has had 3 more episodes of presyncope which occur while eating.  She had no episodes while wearing the Holter monitor applied by her cardiologist , but has had them since then.   She is due for colonoscopy.    Past Medical History  Diagnosis Date  . Diverticulitis of colon   . Rheumatic fever     possible at age 19  . Tobacco abuse   . Hyperlipidemia   . Abdominal aortic aneurysm without mention of rupture     infrarenal, stable, folllowed by Leotis Pain  . CAD (coronary artery disease), autologous vein bypass graft   . Peripheral vascular disease     s/p CEA   . Tobacco abuse   . B12 deficiency   . Acute posthemorrhagic anemia   . Cardiac dysrhythmia, unspecified   . Neuropathy   . Reflux esophagitis   . Sliding hiatal hernia   . GERD (gastroesophageal reflux disease)   . Colon polyp     Past Surgical History  Procedure Laterality Date  . Closed manipulation shoulder      left shoulder post physical therapy, Right shoulder redo (toggle bolts)  . Rotator cuff repair  2002    right shoulder Dr. Nadean Corwin, [place] in [place]      Carotid stenosis found during workup for dysphagia,  [facility]  . Lumbar disc surgery  1971    L5, unssuccessful, fusion in 1983 successful (2 lumbar)  . Abdominal surgery  1976    for pain secondary to scar tissue, s/p apply  . 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 Hernia repair  may 2011    Dr. Pat Patrick  . Coronary artery bypass graft  2006    s/p triple bypass surgery, [facility]  . Cholecystectomy  2002    Dr. Pat Patrick  . Coronary angiography  01/2011    one occluded artery with collateralization,  other arteries patent  . Hernia repair    . Appendectomy  1974       The following portions of the patient's history were reviewed and updated as appropriate: Allergies, current medications, and problem list.    Review of Systems:   Patient denies headache, fevers, malaise, unintentional weight loss, skin rash, eye pain, sinus congestion and sinus pain, sore throat, dysphagia,  hemoptysis , cough, dyspnea, wheezing, chest pain, palpitations, orthopnea, edema, abdominal pain, nausea, melena, diarrhea, constipation, flank pain, dysuria, hematuria, urinary  Frequency, nocturia, numbness, tingling, seizures,  Focal weakness, Loss of consciousness,  Tremor, insomnia, depression, anxiety, and suicidal ideation.     History    Social History  . Marital Status: Widowed    Spouse Name: N/A    Number of Children: N/A  . Years of Education: N/A   Occupational History  . Not on file.   Social History Main Topics  . Smoking status: Former Smoker -- 0.50 packs/day for 30 years    Types: Cigarettes    Quit date: 04/01/2012  . Smokeless tobacco: Never Used     Comment: quit for 2 years after sinus infection and 6 months after heart surgery  . Alcohol Use: No  . Drug Use: No  . Sexual Activity: Not on file   Other Topics Concern  . Not on file   Social History Narrative   Lives with spouse [name]    Objective:  Filed Vitals:   04/22/14 0902  BP: 110/62  Pulse: 67  Temp: 98.4 F (36.9 C)  Resp: 16     General appearance: alert, cooperative and appears stated age Ears: normal TM's and external ear canals both ears Throat: lips, mucosa, and tongue normal; teeth and gums normal Neck: no adenopathy, no carotid bruit, supple, symmetrical, trachea midline and thyroid not enlarged, symmetric, no tenderness/mass/nodules Back: symmetric, no curvature. ROM normal. No CVA tenderness. Lungs: clear to auscultation bilaterally Heart: regular rate and rhythm, S1, S2 normal, no murmur, click, rub or gallop Abdomen: soft, non-tender; bowel sounds normal; no masses,  no organomegaly Pulses: 2+ and symmetric Skin: Skin color, texture, turgor normal. No rashes or lesions Lymph nodes: Cervical, supraclavicular, and axillary nodes normal.  Assessment and Plan:  Globus sensation Upper GI barium study was normal. No reflux noted either .  Likely anxiety related   Chronic reflux esophagitis No reflux noted during recent GI eval.,  Given her recurrent hypomagnesemia I have advised her today to stop taking omeprazole daily and use OTC famotidine prn,.   Hypomagnesemia Initially presumed secondary to poor po intake; however now suspect due to chronic use of PPI.  Stopping omeprazole,  Continue oral supplements.   Had transfusion last week,  Repeat mg to be drawn the day of her colonoscopy next week and repeat infusion if <1.5 .  Referral to Nephrology for evaluation to rule out Mg wasting renal syndromes.  Dizziness and giddiness Patient states that she had no episodes while wearing the monitor but has continued to have very distressing epiosdes of presyncope during eating.  Advised to return to her cardiologist for a 30 day monitor   Anxiety state, unspecified Continue buspirone 2 to 3 times daily,  Clonazepam prn.   A total of 25 minutes was spent with patient more than half of which was spent in counseling on the above reference conditions , reviewing records from other providers and coordination of care.  Updated Medication List Outpatient Encounter Prescriptions as of 04/22/2014  Medication Sig  .  albuterol (PROVENTIL HFA;VENTOLIN HFA) 108 (90 BASE) MCG/ACT inhaler Inhale 1 puff into the lungs daily as needed.  Marland Kitchen aspirin 325 MG tablet Take 1 tablet (325 mg total) by mouth daily.  . busPIRone (BUSPAR) 7.5 MG tablet Take 1 tablet (7.5 mg total) by mouth 3 (three) times daily.  . cholecalciferol (VITAMIN D) 1000 UNITS tablet Take 1,000 Units by mouth daily.  . clonazePAM (KLONOPIN) 0.5 MG tablet Take 1 tablet (0.5 mg total) by mouth 2 (two) times daily as needed.  . conjugated estrogens (PREMARIN) vaginal cream Place 1 Applicatorful vaginally at bedtime.  . diclofenac (VOLTAREN) 75 MG EC tablet Take 1 tablet (75 mg total) by mouth 2 (two) times daily.  Marland Kitchen dicyclomine (BENTYL) 20 MG tablet Take 20 mg by mouth 2 (two) times daily.  . magnesium oxide (MAG-OX) 400 (241.3 MG) MG tablet Take 1 tablet (400 mg total) by mouth 2 (two) times daily.  . nitroGLYCERIN (NITROSTAT) 0.4 MG SL tablet Place 0.4 mg under the tongue every 5 (five) minutes as needed. As needed for chest pain   . polyethylene glycol (MIRALAX / GLYCOLAX) packet Take 17 g by mouth daily.  . rosuvastatin (CRESTOR) 20 MG tablet Take 1 tablet  (20 mg total) by mouth daily.  . rosuvastatin (CRESTOR) 20 MG tablet Take 1 tablet (20 mg total) by mouth daily.  . traMADol (ULTRAM) 50 MG tablet Take 1 tablet (50 mg total) by mouth every 8 (eight) hours as needed for pain.  . [DISCONTINUED] omeprazole (PRILOSEC) 40 MG capsule TAKE 1 CAPSULE BY MOUTH TWICE DAILY.  Marland Kitchen albuterol (PROVENTIL HFA;VENTOLIN HFA) 108 (90 BASE) MCG/ACT inhaler Inhale 2 puffs into the lungs every 6 (six) hours as needed for wheezing.  Marland Kitchen estradiol (ESTRACE) 0.1 MG/GM vaginal cream Use nightly for two weeks,  Then twice weekly thereafter  . metoprolol tartrate (LOPRESSOR) 25 MG tablet Take 0.5 tablets (12.5 mg total) by mouth 2 (two) times daily.  . [DISCONTINUED] diclofenac (VOLTAREN) 75 MG EC tablet TAKE 1 TABLET TWICE A DAY     Orders Placed This Encounter  Procedures  . Ambulatory referral to Nephrology    Return in about 3 months (around 07/23/2014).

## 2014-04-23 DIAGNOSIS — Z79899 Other long term (current) drug therapy: Secondary | ICD-10-CM | POA: Diagnosis not present

## 2014-04-23 LAB — BASIC METABOLIC PANEL
ANION GAP: 8 (ref 7–16)
BUN: 45 mg/dL — AB (ref 7–18)
CHLORIDE: 106 mmol/L (ref 98–107)
CO2: 31 mmol/L (ref 21–32)
Calcium, Total: 9.4 mg/dL (ref 8.5–10.1)
Creatinine: 1.64 mg/dL — ABNORMAL HIGH (ref 0.60–1.30)
EGFR (Non-African Amer.): 31 — ABNORMAL LOW
GFR CALC AF AMER: 36 — AB
Glucose: 129 mg/dL — ABNORMAL HIGH (ref 65–99)
Osmolality: 302 (ref 275–301)
POTASSIUM: 4.4 mmol/L (ref 3.5–5.1)
SODIUM: 145 mmol/L (ref 136–145)

## 2014-04-23 LAB — CBC CANCER CENTER
Basophil #: 0 x10 3/mm (ref 0.0–0.1)
Basophil %: 0.4 %
EOS PCT: 3.1 %
Eosinophil #: 0.2 x10 3/mm (ref 0.0–0.7)
HCT: 30.5 % — ABNORMAL LOW (ref 35.0–47.0)
HGB: 10.4 g/dL — ABNORMAL LOW (ref 12.0–16.0)
LYMPHS PCT: 23.8 %
Lymphocyte #: 1.4 x10 3/mm (ref 1.0–3.6)
MCH: 29.3 pg (ref 26.0–34.0)
MCHC: 34.1 g/dL (ref 32.0–36.0)
MCV: 86 fL (ref 80–100)
Monocyte #: 0.3 x10 3/mm (ref 0.2–0.9)
Monocyte %: 6 %
NEUTROS PCT: 66.7 %
Neutrophil #: 3.8 x10 3/mm (ref 1.4–6.5)
Platelet: 221 x10 3/mm (ref 150–440)
RBC: 3.54 10*6/uL — ABNORMAL LOW (ref 3.80–5.20)
RDW: 14.5 % (ref 11.5–14.5)
WBC: 5.7 x10 3/mm (ref 3.6–11.0)

## 2014-04-23 LAB — IRON AND TIBC
IRON BIND. CAP.(TOTAL): 261 ug/dL (ref 250–450)
IRON: 78 ug/dL (ref 50–170)
Iron Saturation: 30 %
Unbound Iron-Bind.Cap.: 183 ug/dL

## 2014-04-23 LAB — FERRITIN: Ferritin (ARMC): 115 ng/mL (ref 8–388)

## 2014-04-23 LAB — RETICULOCYTES
Absolute Retic Count: 0.0894 10*6/uL (ref 0.019–0.186)
RETICULOCYTE: 2.52 % (ref 0.4–3.1)

## 2014-04-23 LAB — MAGNESIUM: Magnesium: 1.8 mg/dL

## 2014-04-23 LAB — LACTATE DEHYDROGENASE: LDH: 168 U/L (ref 81–246)

## 2014-04-24 NOTE — Assessment & Plan Note (Signed)
Initially presumed secondary to poor po intake; however now suspect due to chronic use of PPI.  Stopping omeprazole,  Continue oral supplements.  Had transfusion last week,  Repeat mg to be drawn the day of her colonoscopy next week and repeat infusion if <1.5

## 2014-04-24 NOTE — Assessment & Plan Note (Signed)
Patient states that she had no episodes while wearing the monitor but has continued to have very distressing epiosdes of presyncope during eating.  Advised to return to her cardiologist for a 30 day monitor

## 2014-04-24 NOTE — Assessment & Plan Note (Signed)
Continue buspirone 2 to 3 times daily,  Clonazepam prn.

## 2014-04-24 NOTE — Assessment & Plan Note (Signed)
Upper GI barium study was normal. No reflux noted either .  Likely anxiety related

## 2014-04-24 NOTE — Assessment & Plan Note (Signed)
No reflux noted during recent GI eval.,  Given her recurrent hypomagnesemia I have advised her today to stop taking omeprazole daily and use OTC famotidine prn,.

## 2014-04-27 LAB — KAPPA/LAMBDA FREE LIGHT CHAINS (ARMC)

## 2014-04-27 LAB — PROT IMMUNOELECTROPHORES(ARMC)

## 2014-04-30 ENCOUNTER — Encounter: Payer: Self-pay | Admitting: Internal Medicine

## 2014-04-30 ENCOUNTER — Ambulatory Visit (INDEPENDENT_AMBULATORY_CARE_PROVIDER_SITE_OTHER): Payer: Medicare Other | Admitting: Internal Medicine

## 2014-04-30 ENCOUNTER — Telehealth: Payer: Self-pay | Admitting: *Deleted

## 2014-04-30 VITALS — BP 128/68 | HR 54 | Temp 97.7°F | Resp 16 | Ht 64.5 in | Wt 153.8 lb

## 2014-04-30 DIAGNOSIS — G458 Other transient cerebral ischemic attacks and related syndromes: Secondary | ICD-10-CM

## 2014-04-30 DIAGNOSIS — G459 Transient cerebral ischemic attack, unspecified: Secondary | ICD-10-CM

## 2014-04-30 DIAGNOSIS — R0989 Other specified symptoms and signs involving the circulatory and respiratory systems: Secondary | ICD-10-CM

## 2014-04-30 DIAGNOSIS — N179 Acute kidney failure, unspecified: Secondary | ICD-10-CM

## 2014-04-30 DIAGNOSIS — R300 Dysuria: Secondary | ICD-10-CM

## 2014-04-30 DIAGNOSIS — N951 Menopausal and female climacteric states: Secondary | ICD-10-CM

## 2014-04-30 DIAGNOSIS — G4762 Sleep related leg cramps: Secondary | ICD-10-CM | POA: Diagnosis not present

## 2014-04-30 DIAGNOSIS — R509 Fever, unspecified: Secondary | ICD-10-CM

## 2014-04-30 DIAGNOSIS — R09A2 Foreign body sensation, throat: Secondary | ICD-10-CM

## 2014-04-30 DIAGNOSIS — R198 Other specified symptoms and signs involving the digestive system and abdomen: Secondary | ICD-10-CM

## 2014-04-30 DIAGNOSIS — K2100 Gastro-esophageal reflux disease with esophagitis, without bleeding: Secondary | ICD-10-CM | POA: Diagnosis not present

## 2014-04-30 DIAGNOSIS — F411 Generalized anxiety disorder: Secondary | ICD-10-CM

## 2014-04-30 DIAGNOSIS — R6883 Chills (without fever): Secondary | ICD-10-CM | POA: Diagnosis not present

## 2014-04-30 DIAGNOSIS — D649 Anemia, unspecified: Secondary | ICD-10-CM

## 2014-04-30 DIAGNOSIS — F458 Other somatoform disorders: Secondary | ICD-10-CM

## 2014-04-30 DIAGNOSIS — R232 Flushing: Secondary | ICD-10-CM

## 2014-04-30 LAB — CBC WITH DIFFERENTIAL/PLATELET
BASOS ABS: 0 10*3/uL (ref 0.0–0.1)
Basophils Relative: 0.4 % (ref 0.0–3.0)
Eosinophils Absolute: 0.2 10*3/uL (ref 0.0–0.7)
Eosinophils Relative: 3.3 % (ref 0.0–5.0)
HEMATOCRIT: 29.9 % — AB (ref 36.0–46.0)
Hemoglobin: 10.2 g/dL — ABNORMAL LOW (ref 12.0–15.0)
LYMPHS ABS: 1.8 10*3/uL (ref 0.7–4.0)
Lymphocytes Relative: 32.9 % (ref 12.0–46.0)
MCHC: 34.2 g/dL (ref 30.0–36.0)
MCV: 86.3 fl (ref 78.0–100.0)
MONOS PCT: 7.6 % (ref 3.0–12.0)
Monocytes Absolute: 0.4 10*3/uL (ref 0.1–1.0)
Neutro Abs: 3 10*3/uL (ref 1.4–7.7)
Neutrophils Relative %: 55.8 % (ref 43.0–77.0)
PLATELETS: 245 10*3/uL (ref 150.0–400.0)
RBC: 3.46 Mil/uL — ABNORMAL LOW (ref 3.87–5.11)
RDW: 14.5 % (ref 11.5–15.5)
WBC: 5.4 10*3/uL (ref 4.0–10.5)

## 2014-04-30 LAB — URINALYSIS, ROUTINE W REFLEX MICROSCOPIC
BILIRUBIN URINE: NEGATIVE
HGB URINE DIPSTICK: NEGATIVE
KETONES UR: NEGATIVE
NITRITE: NEGATIVE
PH: 5.5 (ref 5.0–8.0)
RBC / HPF: NONE SEEN (ref 0–?)
Specific Gravity, Urine: 1.02 (ref 1.000–1.030)
TOTAL PROTEIN, URINE-UPE24: NEGATIVE
URINE GLUCOSE: NEGATIVE
Urobilinogen, UA: 0.2 (ref 0.0–1.0)

## 2014-04-30 LAB — BASIC METABOLIC PANEL
BUN: 39 mg/dL — ABNORMAL HIGH (ref 6–23)
CALCIUM: 9.2 mg/dL (ref 8.4–10.5)
CO2: 27 meq/L (ref 19–32)
Chloride: 107 mEq/L (ref 96–112)
Creatinine, Ser: 1.6 mg/dL — ABNORMAL HIGH (ref 0.4–1.2)
GFR: 32.97 mL/min — AB (ref >60.00)
Glucose, Bld: 100 mg/dL — ABNORMAL HIGH (ref 70–99)
POTASSIUM: 4.6 meq/L (ref 3.5–5.1)
SODIUM: 140 meq/L (ref 135–145)

## 2014-04-30 LAB — POCT URINALYSIS DIPSTICK
Bilirubin, UA: NEGATIVE
Blood, UA: NEGATIVE
Glucose, UA: NEGATIVE
Ketones, UA: NEGATIVE
NITRITE UA: NEGATIVE
PH UA: 5
PROTEIN UA: 30
Spec Grav, UA: 1.015
UROBILINOGEN UA: 0.2

## 2014-04-30 LAB — POTASSIUM: POTASSIUM: 4.5 mmol/L (ref 3.5–5.1)

## 2014-04-30 LAB — CREATININE, SERUM
CREATININE: 1.69 mg/dL — AB (ref 0.60–1.30)
EGFR (African American): 35 — ABNORMAL LOW
EGFR (Non-African Amer.): 30 — ABNORMAL LOW

## 2014-04-30 LAB — MAGNESIUM: MAGNESIUM: 2.3 mg/dL

## 2014-04-30 MED ORDER — FAMOTIDINE 20 MG PO TABS: 20.0000 mg | ORAL_TABLET | Freq: Two times a day (BID) | ORAL | Status: DC

## 2014-04-30 MED ORDER — ONDANSETRON HCL 4 MG PO TABS: 4.0000 mg | ORAL_TABLET | Freq: Three times a day (TID) | ORAL | Status: DC | PRN

## 2014-04-30 NOTE — Telephone Encounter (Signed)
What is the dx for the blood culture, cbc and cmet?

## 2014-04-30 NOTE — Telephone Encounter (Signed)
I entered them in ,  Can you see them?

## 2014-04-30 NOTE — Progress Notes (Signed)
Patient ID: Sabrina Montoya, female   DOB: 1941/06/17, 73 y.o.   MRN: [id number]  Patient Active Problem List   Diagnosis Date Noted  . Hot flashes 05/02/2014  . Acute renal failure 05/02/2014  . Anemia 05/02/2014  . Globus sensation 02/15/2014  . TIA (transient ischemic attack) 09/11/2013  . Dizziness and giddiness 09/02/2013  . Arrhythmia 07/01/2013  . Neuropathy 07/01/2013  . Overweight (BMI 25.0-29.9) 03/16/2013  . Anxiety state, unspecified 03/16/2013  . Chronic reflux esophagitis 12/29/2012  . Right hip pain 12/29/2012  . History of tobacco abuse 07/03/2012  . Hypomagnesemia 06/01/2012  . Bradycardia 06/01/2012  . CAD (coronary artery disease), autologous vein bypass graft   . Peripheral vascular disease   . Arthritis 02/29/2012  . Aneurysm of abdominal aorta 01/05/2012  . Asymptomatic carotid artery stenosis 01/05/2012  . Hyperlipidemia   . Osteopenia 08/29/2011  . Vitamin D deficiency 08/29/2011    Subjective:  CC:   Chief Complaint  Patient presents with  . Acute Visit    sweating profusely at cancer center with chills then nausea. X4 days reported by patient comes in episodes lasting up to one hour.    HPI:   Sabrina Montoya is a 73 y.o. female who presents for Patient seen one week ago for discussion of chronic magnesium deficiency.  PPI stopped At [facility] today Mg level  was 2.3 , so no transfusion was needed.  She was referred here because she reported to them that for the past 4 days , since the weekend she has been having fever, chills with sweats and episodes of nausea without emesis despite rushing to the bathroom.   \She denies diarrhea, recent travel/use of public transportation,  or sick contacts..  Does not feel achey, No appetite but has been maintaining hydration   symptoms feel like menopausal hot flashes, but they are lasting for 1.5 hr .  Frequent urination , but no dysuria.     Past Medical History  Diagnosis Date  . Diverticulitis of colon   .  Rheumatic fever     possible at age 43  . Tobacco abuse   . Hyperlipidemia   . Abdominal aortic aneurysm without mention of rupture     infrarenal, stable, folllowed by Leotis Pain  . CAD (coronary artery disease), autologous vein bypass graft   . Peripheral vascular disease     s/p CEA   . Tobacco abuse   . B12 deficiency   . Acute posthemorrhagic anemia   . Cardiac dysrhythmia, unspecified   . Neuropathy   . Reflux esophagitis   . Sliding hiatal hernia   . GERD (gastroesophageal reflux disease)   . Colon polyp     Past Surgical History  Procedure Laterality Date  . Closed manipulation shoulder      left shoulder post physical therapy, Right shoulder redo (toggle bolts)  . Rotator cuff repair  2002    right shoulder Dr. Nadean Corwin, [facility] in [place]      Carotid stenosis found during workup for dysphagia,  [facility]  . Lumbar disc surgery  1971    L5, unssuccessful, fusion in 1983 successful (2 lumbar)  . Abdominal surgery  1976    for pain secondary to scar tissue, s/p apply  . 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 Hernia repair  may 2011    Dr. Pat Patrick  . Coronary artery bypass graft  2006  s/p triple bypass surgery, [facility]  . Cholecystectomy  2002    Dr. Pat Patrick  . Coronary angiography  01/2011    one occluded artery with collateralization, other arteries patent  . Hernia repair    . Appendectomy  1974       The following portions of the patient's history were reviewed and updated as appropriate: Allergies, current medications, and problem list.    Review of Systems:   Patient denies headache, fevers, malaise, unintentional weight loss, skin rash, eye pain, sinus congestion and sinus pain, sore throat, dysphagia,  hemoptysis , cough, dyspnea, wheezing, chest pain, palpitations, orthopnea, edema, abdominal pain, nausea, melena, diarrhea, constipation, flank pain, dysuria, hematuria, urinary   Frequency, nocturia, numbness, tingling, seizures,  Focal weakness, Loss of consciousness,  Tremor, insomnia, depression, anxiety, and suicidal ideation.     History   Social History  . Marital Status: Widowed    Spouse Name: N/A    Number of Children: N/A  . Years of Education: N/A   Occupational History  . Not on file.   Social History Main Topics  . Smoking status: Former Smoker -- 0.50 packs/day for 30 years    Types: Cigarettes    Quit date: 04/01/2012  . Smokeless tobacco: Never Used     Comment: quit for 2 years after sinus infection and 6 months after heart surgery  . Alcohol Use: No  . Drug Use: No  . Sexual Activity: Not on file   Other Topics Concern  . Not on file   Social History Narrative   Lives with spouse [name]    Objective:  Filed Vitals:   04/30/14 1043  BP: 128/68  Pulse: 54  Temp: 97.7 F (36.5 C)  Resp: 16     General appearance: alert, cooperative and appears stated age Ears: normal TM's and external ear canals both ears Throat: lips, mucosa, and tongue normal; teeth and gums normal Neck: no adenopathy, no carotid bruit, supple, symmetrical, trachea midline and thyroid not enlarged, symmetric, no tenderness/mass/nodules Back: symmetric, no curvature. ROM normal. No CVA tenderness. Lungs: clear to auscultation bilaterally Heart: regular rate and rhythm, S1, S2 normal, no murmur, click, rub or gallop Abdomen: soft, non-tender; bowel sounds normal; no masses,  no organomegaly Pulses: 2+ and symmetric Skin: Skin color, texture, turgor normal. No rashes or lesions Lymph nodes: Cervical, supraclavicular, and axillary nodes normal.  Assessment and Plan:  Hot flashes Her subjective fevers and chills are of unclear etiology given her normal exam. Absence of leukocytosis, normal blood and  urine cultures.  I suspect that her symptoms are due to hot flashes by chronic GI complaints    Acute renal failure Presumed prerenal azotemia given her  history.  Advised to increase hydration, recheck in one week   Anxiety state, unspecified Continue buspirone 2 to 3 times daily,  Clonazepam prn.     Hypomagnesemia Improved with suspension of PPI one week ago.   Anemia Chronic, normocytic, with drop noted despite other labs suggestive of dehdyraiton.  B12/folate was normal less than 10 months ago,  Will recheck given use of PPI.  Consider secondary to CKD>   Chronic reflux esophagitis No reflux was seen on recent barium swallow and EGD.  Given her chronically low mg requiring IV transfusions,  I have discontinued her PPI  And advised her to [place] famotidine 20 mg prn   TIA (transient ischemic attack) Prior admission for transient neurologic event which occurred during GI evaluation . MRI /MRA was done Nov  2014 and normal.  Carotid  Dopplers were subesquently done at [facility] and noncritical.    Globus sensation Recent cardiology evaluation was normal  A total of 40 minutes was spent with patient more than half of which was spent in counseling, reviewing records from other prviders and coordination of care.   Updated Medication List Outpatient Encounter Prescriptions as of 04/30/2014  Medication Sig  . albuterol (PROVENTIL HFA;VENTOLIN HFA) 108 (90 BASE) MCG/ACT inhaler Inhale 1 puff into the lungs daily as needed.  Marland Kitchen aspirin 325 MG tablet Take 1 tablet (325 mg total) by mouth daily.  . busPIRone (BUSPAR) 7.5 MG tablet Take 1 tablet (7.5 mg total) by mouth 3 (three) times daily.  . cholecalciferol (VITAMIN D) 1000 UNITS tablet Take 1,000 Units by mouth daily.  Marland Kitchen conjugated estrogens (PREMARIN) vaginal cream Place 1 Applicatorful vaginally at bedtime.  . diclofenac (VOLTAREN) 75 MG EC tablet Take 1 tablet (75 mg total) by mouth 2 (two) times daily.  Marland Kitchen dicyclomine (BENTYL) 20 MG tablet Take 20 mg by mouth 2 (two) times daily.  Marland Kitchen estradiol (ESTRACE) 0.1 MG/GM vaginal cream Use nightly for two weeks,  Then twice weekly thereafter  . magnesium  oxide (MAG-OX) 400 (241.3 MG) MG tablet Take 1 tablet (400 mg total) by mouth 2 (two) times daily.  . nitroGLYCERIN (NITROSTAT) 0.4 MG SL tablet Place 0.4 mg under the tongue every 5 (five) minutes as needed. As needed for chest pain   . rosuvastatin (CRESTOR) 20 MG tablet Take 1 tablet (20 mg total) by mouth daily.  . rosuvastatin (CRESTOR) 20 MG tablet Take 1 tablet (20 mg total) by mouth daily.  . traMADol (ULTRAM) 50 MG tablet Take 1 tablet (50 mg total) by mouth every 8 (eight) hours as needed for pain.  . [DISCONTINUED] clonazePAM (KLONOPIN) 0.5 MG tablet Take 1 tablet (0.5 mg total) by mouth 2 (two) times daily as needed.  Marland Kitchen albuterol (PROVENTIL HFA;VENTOLIN HFA) 108 (90 BASE) MCG/ACT inhaler Inhale 2 puffs into the lungs every 6 (six) hours as needed for wheezing.  . famotidine (PEPCID) 20 MG tablet Take 1 tablet (20 mg total) by mouth 2 (two) times daily. beofre breakfast and dinner  . metoprolol tartrate (LOPRESSOR) 25 MG tablet Take 0.5 tablets (12.5 mg total) by mouth 2 (two) times daily.  . ondansetron (ZOFRAN) 4 MG tablet Take 1 tablet (4 mg total) by mouth every 8 (eight) hours as needed for nausea or vomiting.  . polyethylene glycol (MIRALAX / GLYCOLAX) packet Take 17 g by mouth daily.     Orders Placed This Encounter  Procedures  . CULTURE, URINE COMPREHENSIVE  . Blood culture (routine single)  . Blood culture (routine single)  . Micro Review  . Urinalysis, Routine w reflex microscopic  . CBC with Differential  . Basic metabolic panel  . Basic metabolic panel  . B12  . Folate RBC  . Erythropoietin  . CBC with Differential  . Magnesium  . T4 AND TSH  . POCT Urinalysis Dipstick    No Follow-up on file.

## 2014-04-30 NOTE — Telephone Encounter (Signed)
Now i can, thank you

## 2014-04-30 NOTE — Progress Notes (Signed)
Pre-visit discussion using our clinic review tool. No additional management support is needed unless otherwise documented below in the visit note.  

## 2014-04-30 NOTE — Patient Instructions (Signed)
Suspend the diclofenac for now   Or reduce to once daily with a meal  Ok to combine buspirone with your magnesium oxide tablet.   Ok to continue the clonazepam as needed,  Continue the buspirone two to three times daily for anxiety  Ok to stop the dicyclomine for irritable bowel if you are not having abdominal cramping;  It will not help leg cramps  For the nausea  Take odansetron every 8 hours  Try the G2 red punch flavor  To hydrate   Start the generic pepcid before meals  To see if it prevents the indigestion.    contineu to oral magnesium for now.,  We may be able to stop it if your next mg level is  Over 2.0    Try an ounce of tonic water at night to prevent leg cramps ,  And stretch your legs before you go to bed

## 2014-05-01 ENCOUNTER — Encounter: Payer: Self-pay | Admitting: *Deleted

## 2014-05-01 DIAGNOSIS — N183 Chronic kidney disease, stage 3 unspecified: Secondary | ICD-10-CM | POA: Diagnosis not present

## 2014-05-01 DIAGNOSIS — I10 Essential (primary) hypertension: Secondary | ICD-10-CM | POA: Diagnosis not present

## 2014-05-01 DIAGNOSIS — N2581 Secondary hyperparathyroidism of renal origin: Secondary | ICD-10-CM | POA: Diagnosis not present

## 2014-05-01 DIAGNOSIS — N039 Chronic nephritic syndrome with unspecified morphologic changes: Secondary | ICD-10-CM | POA: Diagnosis not present

## 2014-05-01 DIAGNOSIS — R7989 Other specified abnormal findings of blood chemistry: Secondary | ICD-10-CM | POA: Diagnosis not present

## 2014-05-01 DIAGNOSIS — D631 Anemia in chronic kidney disease: Secondary | ICD-10-CM | POA: Diagnosis not present

## 2014-05-01 LAB — CULTURE, URINE COMPREHENSIVE
COLONY COUNT: NO GROWTH
ORGANISM ID, BACTERIA: NO GROWTH

## 2014-05-02 ENCOUNTER — Encounter: Payer: Self-pay | Admitting: Internal Medicine

## 2014-05-02 DIAGNOSIS — D649 Anemia, unspecified: Secondary | ICD-10-CM | POA: Insufficient documentation

## 2014-05-02 DIAGNOSIS — N179 Acute kidney failure, unspecified: Secondary | ICD-10-CM | POA: Insufficient documentation

## 2014-05-02 DIAGNOSIS — R232 Flushing: Secondary | ICD-10-CM | POA: Insufficient documentation

## 2014-05-02 DIAGNOSIS — D631 Anemia in chronic kidney disease: Secondary | ICD-10-CM | POA: Insufficient documentation

## 2014-05-02 NOTE — Assessment & Plan Note (Addendum)
Chronic, normocytic, with drop noted despite other labs suggestive of dehdyraiton.  B12/folate was normal less than 10 months ago,  Will recheck given use of PPI.  Consider secondary to CKD>

## 2014-05-02 NOTE — Assessment & Plan Note (Signed)
Presumed prerenal azotemia given her history.  Advised to increase hydration, recheck in one week

## 2014-05-02 NOTE — Assessment & Plan Note (Signed)
Her subjective fevers and chills are of unclear etiology given her normal exam. Absence of leukocytosis, normal blood and  urine cultures.  I suspect that her symptoms are due to hot flashes by chronic GI complaints

## 2014-05-02 NOTE — Assessment & Plan Note (Signed)
No reflux was seen on recent barium swallow and EGD.  Given her chronically low mg requiring IV transfusions,  I have discontinued her PPI  And advised her to [place] famotidine 20 mg prn

## 2014-05-02 NOTE — Assessment & Plan Note (Signed)
Recent cardiology evaluation was normal

## 2014-05-02 NOTE — Assessment & Plan Note (Signed)
Improved with suspension of PPI one week ago.

## 2014-05-02 NOTE — Assessment & Plan Note (Signed)
Continue buspirone 2 to 3 times daily,  Clonazepam prn.

## 2014-05-02 NOTE — Assessment & Plan Note (Addendum)
Prior admission for transient neurologic event which occurred during GI evaluation . MRI /MRA was done Nov 2014 and normal.  Carotid  Dopplers were subesquently done at [facility] and noncritical.

## 2014-05-04 DIAGNOSIS — L821 Other seborrheic keratosis: Secondary | ICD-10-CM | POA: Diagnosis not present

## 2014-05-04 DIAGNOSIS — D045 Carcinoma in situ of skin of trunk: Secondary | ICD-10-CM | POA: Diagnosis not present

## 2014-05-05 ENCOUNTER — Telehealth: Payer: Self-pay | Admitting: Internal Medicine

## 2014-05-05 NOTE — Telephone Encounter (Signed)
She can stop the buspirone immediately.  If the dizziness and nausea improve, we will not resume.  Remind her that she can use her husband's leftover zofran (odansetron) prn nausea if she has not thrown it out (after he passed)

## 2014-05-05 NOTE — Telephone Encounter (Signed)
Pt left vm stating she has been taking magnesium, buspirone and she thinks the buspirone is making her sick.  States she threw up this morning, yesterday felt dizzy and felt that she was going to throw up all day.  States she needs to speak with someone regarding the medications and what to do.

## 2014-05-05 NOTE — Telephone Encounter (Signed)
She should have her mg repeated before we make that decision .  It has been ordered.

## 2014-05-05 NOTE — Telephone Encounter (Signed)
Patient notified and voiced understanding, patient also schedule lab appointment to recheck kidney function as requested in Lab note. Patient also ask if she needs to continue the Intravenous magnesium.

## 2014-05-05 NOTE — Telephone Encounter (Signed)
Pt notified of unread message by Juliann Pulse.

## 2014-05-06 ENCOUNTER — Ambulatory Visit: Payer: Self-pay | Admitting: Internal Medicine

## 2014-05-06 ENCOUNTER — Other Ambulatory Visit (INDEPENDENT_AMBULATORY_CARE_PROVIDER_SITE_OTHER): Payer: Medicare Other

## 2014-05-06 DIAGNOSIS — D649 Anemia, unspecified: Secondary | ICD-10-CM

## 2014-05-06 DIAGNOSIS — K219 Gastro-esophageal reflux disease without esophagitis: Secondary | ICD-10-CM | POA: Diagnosis not present

## 2014-05-06 DIAGNOSIS — Z79899 Other long term (current) drug therapy: Secondary | ICD-10-CM | POA: Diagnosis not present

## 2014-05-06 DIAGNOSIS — N179 Acute kidney failure, unspecified: Secondary | ICD-10-CM

## 2014-05-06 DIAGNOSIS — I2510 Atherosclerotic heart disease of native coronary artery without angina pectoris: Secondary | ICD-10-CM | POA: Diagnosis not present

## 2014-05-06 DIAGNOSIS — M899 Disorder of bone, unspecified: Secondary | ICD-10-CM | POA: Diagnosis not present

## 2014-05-06 DIAGNOSIS — R7989 Other specified abnormal findings of blood chemistry: Secondary | ICD-10-CM | POA: Diagnosis not present

## 2014-05-06 DIAGNOSIS — E785 Hyperlipidemia, unspecified: Secondary | ICD-10-CM | POA: Diagnosis not present

## 2014-05-06 DIAGNOSIS — F411 Generalized anxiety disorder: Secondary | ICD-10-CM | POA: Diagnosis not present

## 2014-05-06 LAB — CULTURE, BLOOD (SINGLE): ORGANISM ID, BACTERIA: NO GROWTH

## 2014-05-06 NOTE — Telephone Encounter (Signed)
Patient notified

## 2014-05-07 ENCOUNTER — Other Ambulatory Visit: Payer: Self-pay | Admitting: Internal Medicine

## 2014-05-07 ENCOUNTER — Other Ambulatory Visit: Payer: Medicare Other

## 2014-05-07 DIAGNOSIS — N2581 Secondary hyperparathyroidism of renal origin: Secondary | ICD-10-CM | POA: Diagnosis not present

## 2014-05-07 DIAGNOSIS — N183 Chronic kidney disease, stage 3 unspecified: Secondary | ICD-10-CM | POA: Diagnosis not present

## 2014-05-07 DIAGNOSIS — D631 Anemia in chronic kidney disease: Secondary | ICD-10-CM | POA: Diagnosis not present

## 2014-05-07 DIAGNOSIS — N039 Chronic nephritic syndrome with unspecified morphologic changes: Secondary | ICD-10-CM | POA: Diagnosis not present

## 2014-05-07 DIAGNOSIS — I10 Essential (primary) hypertension: Secondary | ICD-10-CM | POA: Diagnosis not present

## 2014-05-07 DIAGNOSIS — N390 Urinary tract infection, site not specified: Secondary | ICD-10-CM | POA: Diagnosis not present

## 2014-05-07 LAB — BASIC METABOLIC PANEL
BUN: 39 mg/dL — ABNORMAL HIGH (ref 6–23)
CALCIUM: 9.3 mg/dL (ref 8.4–10.5)
CO2: 26 meq/L (ref 19–32)
CREATININE: 1.8 mg/dL — AB (ref 0.4–1.2)
Chloride: 107 mEq/L (ref 96–112)
GFR: 29.03 mL/min — AB (ref >60.00)
Glucose, Bld: 111 mg/dL — ABNORMAL HIGH (ref 70–99)
Potassium: 4.9 mEq/L (ref 3.5–5.1)
Sodium: 139 mEq/L (ref 135–145)

## 2014-05-07 LAB — CBC WITH DIFFERENTIAL/PLATELET
BASOS ABS: 0 10*3/uL (ref 0.0–0.1)
BASOS PCT: 0.5 % (ref 0.0–3.0)
Eosinophils Absolute: 0.2 10*3/uL (ref 0.0–0.7)
Eosinophils Relative: 4 % (ref 0.0–5.0)
HCT: 26.6 % — ABNORMAL LOW (ref 36.0–46.0)
HEMOGLOBIN: 9.2 g/dL — AB (ref 12.0–15.0)
LYMPHS PCT: 30.8 % (ref 12.0–46.0)
Lymphs Abs: 1.6 10*3/uL (ref 0.7–4.0)
MCHC: 34.7 g/dL (ref 30.0–36.0)
MCV: 85.5 fl (ref 78.0–100.0)
Monocytes Absolute: 0.4 10*3/uL (ref 0.1–1.0)
Monocytes Relative: 8.5 % (ref 3.0–12.0)
NEUTROS PCT: 56.2 % (ref 43.0–77.0)
Neutro Abs: 2.9 10*3/uL (ref 1.4–7.7)
Platelets: 236 10*3/uL (ref 150.0–400.0)
RBC: 3.12 Mil/uL — ABNORMAL LOW (ref 3.87–5.11)
RDW: 14.3 % (ref 11.5–15.5)
WBC: 5.2 10*3/uL (ref 4.0–10.5)

## 2014-05-07 LAB — CULTURE, BLOOD (SINGLE): Organism ID, Bacteria: NO GROWTH

## 2014-05-07 LAB — VITAMIN B12: VITAMIN B 12: 443 pg/mL (ref 211–911)

## 2014-05-07 LAB — MAGNESIUM: Magnesium: 2.2 mg/dL (ref 1.5–2.5)

## 2014-05-08 ENCOUNTER — Other Ambulatory Visit: Payer: Self-pay | Admitting: Internal Medicine

## 2014-05-08 ENCOUNTER — Encounter: Payer: Self-pay | Admitting: Internal Medicine

## 2014-05-08 DIAGNOSIS — N184 Chronic kidney disease, stage 4 (severe): Secondary | ICD-10-CM

## 2014-05-08 LAB — T4 AND TSH
T4 TOTAL: 6.4 ug/dL (ref 4.5–12.0)
TSH: 1.99 u[IU]/mL (ref 0.450–4.500)

## 2014-05-11 LAB — ERYTHROPOIETIN: Erythropoietin: 10.1 m[IU]/mL (ref 2.6–18.5)

## 2014-05-14 DIAGNOSIS — Z79899 Other long term (current) drug therapy: Secondary | ICD-10-CM | POA: Diagnosis not present

## 2014-05-14 DIAGNOSIS — R7989 Other specified abnormal findings of blood chemistry: Secondary | ICD-10-CM | POA: Diagnosis not present

## 2014-05-14 LAB — BASIC METABOLIC PANEL
Anion Gap: 12 (ref 7–16)
BUN: 40 mg/dL — ABNORMAL HIGH (ref 7–18)
CHLORIDE: 109 mmol/L — AB (ref 98–107)
CREATININE: 1.96 mg/dL — AB (ref 0.60–1.30)
Calcium, Total: 8.7 mg/dL (ref 8.5–10.1)
Co2: 24 mmol/L (ref 21–32)
EGFR (Non-African Amer.): 25 — ABNORMAL LOW
GFR CALC AF AMER: 29 — AB
GLUCOSE: 88 mg/dL (ref 65–99)
Osmolality: 298 (ref 275–301)
Potassium: 5.1 mmol/L (ref 3.5–5.1)
Sodium: 145 mmol/L (ref 136–145)

## 2014-05-14 LAB — CBC CANCER CENTER
BASOS PCT: 0.7 %
Basophil #: 0 x10 3/mm (ref 0.0–0.1)
EOS PCT: 4.1 %
Eosinophil #: 0.2 x10 3/mm (ref 0.0–0.7)
HCT: 27.6 % — ABNORMAL LOW (ref 35.0–47.0)
HGB: 9.2 g/dL — ABNORMAL LOW (ref 12.0–16.0)
LYMPHS ABS: 1.3 x10 3/mm (ref 1.0–3.6)
LYMPHS PCT: 30.5 %
MCH: 29.5 pg (ref 26.0–34.0)
MCHC: 33.5 g/dL (ref 32.0–36.0)
MCV: 88 fL (ref 80–100)
Monocyte #: 0.4 x10 3/mm (ref 0.2–0.9)
Monocyte %: 9.2 %
NEUTROS PCT: 55.5 %
Neutrophil #: 2.3 x10 3/mm (ref 1.4–6.5)
Platelet: 205 x10 3/mm (ref 150–440)
RBC: 3.14 10*6/uL — ABNORMAL LOW (ref 3.80–5.20)
RDW: 14.4 % (ref 11.5–14.5)
WBC: 4.2 x10 3/mm (ref 3.6–11.0)

## 2014-05-14 LAB — MAGNESIUM: Magnesium: 2 mg/dL

## 2014-05-15 ENCOUNTER — Telehealth: Payer: Self-pay | Admitting: Internal Medicine

## 2014-05-15 NOTE — Telephone Encounter (Signed)
Patient came into office for medication clarification printed out current medication list and advised patient of the usage of each medication. Patient voiced understanding and has a current medication list.

## 2014-05-25 ENCOUNTER — Other Ambulatory Visit: Payer: Self-pay | Admitting: Internal Medicine

## 2014-05-25 LAB — CBC CANCER CENTER
BASOS ABS: 0 x10 3/mm (ref 0.0–0.1)
Basophil %: 0.8 %
EOS ABS: 0.2 x10 3/mm (ref 0.0–0.7)
Eosinophil %: 4.2 %
HCT: 29.8 % — AB (ref 35.0–47.0)
HGB: 10 g/dL — ABNORMAL LOW (ref 12.0–16.0)
Lymphocyte #: 1.5 x10 3/mm (ref 1.0–3.6)
Lymphocyte %: 28.1 %
MCH: 29.4 pg (ref 26.0–34.0)
MCHC: 33.6 g/dL (ref 32.0–36.0)
MCV: 87 fL (ref 80–100)
MONO ABS: 0.5 x10 3/mm (ref 0.2–0.9)
Monocyte %: 8.7 %
NEUTROS PCT: 58.2 %
Neutrophil #: 3 x10 3/mm (ref 1.4–6.5)
PLATELETS: 221 x10 3/mm (ref 150–440)
RBC: 3.41 10*6/uL — AB (ref 3.80–5.20)
RDW: 14 % (ref 11.5–14.5)
WBC: 5.2 x10 3/mm (ref 3.6–11.0)

## 2014-05-25 LAB — BASIC METABOLIC PANEL
Anion Gap: 7 (ref 7–16)
BUN: 26 mg/dL — ABNORMAL HIGH (ref 7–18)
CHLORIDE: 106 mmol/L (ref 98–107)
Calcium, Total: 9.1 mg/dL (ref 8.5–10.1)
Co2: 29 mmol/L (ref 21–32)
Creatinine: 1.26 mg/dL (ref 0.60–1.30)
EGFR (African American): 50 — ABNORMAL LOW
GFR CALC NON AF AMER: 43 — AB
Glucose: 110 mg/dL — ABNORMAL HIGH (ref 65–99)
Osmolality: 289 (ref 275–301)
POTASSIUM: 4.7 mmol/L (ref 3.5–5.1)
Sodium: 142 mmol/L (ref 136–145)

## 2014-05-25 LAB — MAGNESIUM: MAGNESIUM: 2 mg/dL

## 2014-05-26 LAB — KAPPA/LAMBDA FREE LIGHT CHAINS (ARMC)

## 2014-05-28 ENCOUNTER — Other Ambulatory Visit: Payer: Medicare Other

## 2014-05-29 ENCOUNTER — Other Ambulatory Visit (INDEPENDENT_AMBULATORY_CARE_PROVIDER_SITE_OTHER): Payer: Medicare Other

## 2014-05-29 DIAGNOSIS — N184 Chronic kidney disease, stage 4 (severe): Secondary | ICD-10-CM

## 2014-05-29 LAB — BASIC METABOLIC PANEL
BUN: 26 mg/dL — ABNORMAL HIGH (ref 6–23)
CO2: 28 mEq/L (ref 19–32)
Calcium: 9.1 mg/dL (ref 8.4–10.5)
Chloride: 107 mEq/L (ref 96–112)
Creatinine, Ser: 1.3 mg/dL — ABNORMAL HIGH (ref 0.4–1.2)
GFR: 44.37 mL/min — ABNORMAL LOW (ref >60.00)
Glucose, Bld: 109 mg/dL — ABNORMAL HIGH (ref 70–99)
Potassium: 4.4 mEq/L (ref 3.5–5.1)
Sodium: 140 mEq/L (ref 135–145)

## 2014-05-31 ENCOUNTER — Encounter: Payer: Self-pay | Admitting: Internal Medicine

## 2014-06-01 DIAGNOSIS — D631 Anemia in chronic kidney disease: Secondary | ICD-10-CM | POA: Diagnosis not present

## 2014-06-01 DIAGNOSIS — N039 Chronic nephritic syndrome with unspecified morphologic changes: Secondary | ICD-10-CM | POA: Diagnosis not present

## 2014-06-01 DIAGNOSIS — I10 Essential (primary) hypertension: Secondary | ICD-10-CM | POA: Diagnosis not present

## 2014-06-01 DIAGNOSIS — N183 Chronic kidney disease, stage 3 unspecified: Secondary | ICD-10-CM | POA: Diagnosis not present

## 2014-06-01 DIAGNOSIS — N2581 Secondary hyperparathyroidism of renal origin: Secondary | ICD-10-CM | POA: Diagnosis not present

## 2014-06-01 LAB — PARATHYROID HORMONE, INTACT (NO CA): PTH: 48.8 pg/mL (ref 14.0–72.0)

## 2014-06-01 LAB — ERYTHROPOIETIN: ERYTHROPOIETIN: 15.9 m[IU]/mL (ref 2.6–18.5)

## 2014-06-03 DIAGNOSIS — L821 Other seborrheic keratosis: Secondary | ICD-10-CM | POA: Diagnosis not present

## 2014-06-03 DIAGNOSIS — D045 Carcinoma in situ of skin of trunk: Secondary | ICD-10-CM | POA: Diagnosis not present

## 2014-06-05 ENCOUNTER — Other Ambulatory Visit: Payer: Self-pay | Admitting: Internal Medicine

## 2014-06-06 ENCOUNTER — Ambulatory Visit: Payer: Self-pay | Admitting: Internal Medicine

## 2014-06-06 DIAGNOSIS — Z79899 Other long term (current) drug therapy: Secondary | ICD-10-CM | POA: Diagnosis not present

## 2014-06-06 DIAGNOSIS — R7989 Other specified abnormal findings of blood chemistry: Secondary | ICD-10-CM | POA: Diagnosis not present

## 2014-06-06 DIAGNOSIS — M899 Disorder of bone, unspecified: Secondary | ICD-10-CM | POA: Diagnosis not present

## 2014-06-06 DIAGNOSIS — E785 Hyperlipidemia, unspecified: Secondary | ICD-10-CM | POA: Diagnosis not present

## 2014-06-06 DIAGNOSIS — I2510 Atherosclerotic heart disease of native coronary artery without angina pectoris: Secondary | ICD-10-CM | POA: Diagnosis not present

## 2014-06-06 DIAGNOSIS — K219 Gastro-esophageal reflux disease without esophagitis: Secondary | ICD-10-CM | POA: Diagnosis not present

## 2014-06-06 DIAGNOSIS — F411 Generalized anxiety disorder: Secondary | ICD-10-CM | POA: Diagnosis not present

## 2014-06-08 DIAGNOSIS — Z79899 Other long term (current) drug therapy: Secondary | ICD-10-CM | POA: Diagnosis not present

## 2014-06-08 LAB — CREATININE, SERUM
CREATININE: 1.34 mg/dL — AB (ref 0.60–1.30)
EGFR (African American): 46 — ABNORMAL LOW
EGFR (Non-African Amer.): 39 — ABNORMAL LOW

## 2014-06-08 LAB — CANCER CENTER HEMOGLOBIN: HGB: 10.2 g/dL — ABNORMAL LOW (ref 12.0–16.0)

## 2014-06-08 LAB — MAGNESIUM: Magnesium: 1.4 mg/dL — ABNORMAL LOW

## 2014-06-09 ENCOUNTER — Telehealth: Payer: Self-pay | Admitting: Internal Medicine

## 2014-06-09 NOTE — Telephone Encounter (Signed)
Patient called and stated she saw Dr.Gittin on 06/08/14 and repeat magnesium was low again ( could not tell nurse exact level) patient ask why she was stopping the oral magnesium the reason her levels dropped look through chart found no where patient was told to stop magnesium. Email stated to patient to continue oral magnesium until next level drawn and with next level if magnesium level was normal that maybe the oral magnesium could be stopped. Advised patient to continue oral magnesium as stated by MD in Email patient received. FYI

## 2014-06-19 LAB — POTASSIUM: POTASSIUM: 4.2 mmol/L (ref 3.5–5.1)

## 2014-06-19 LAB — CREATININE, SERUM
CREATININE: 1.3 mg/dL (ref 0.60–1.30)
EGFR (African American): 47 — ABNORMAL LOW
GFR CALC NON AF AMER: 41 — AB

## 2014-06-19 LAB — MAGNESIUM: MAGNESIUM: 2.1 mg/dL

## 2014-06-19 LAB — CANCER CENTER HEMOGLOBIN: HGB: 10.2 g/dL — ABNORMAL LOW (ref 12.0–16.0)

## 2014-06-26 LAB — CREATININE, SERUM
Creatinine: 1.29 mg/dL (ref 0.60–1.30)
EGFR (African American): 48 — ABNORMAL LOW
GFR CALC NON AF AMER: 41 — AB

## 2014-06-26 LAB — POTASSIUM: Potassium: 4.6 mmol/L (ref 3.5–5.1)

## 2014-06-26 LAB — MAGNESIUM: MAGNESIUM: 2.1 mg/dL

## 2014-06-29 ENCOUNTER — Telehealth: Payer: Self-pay | Admitting: Internal Medicine

## 2014-06-29 MED ORDER — ATORVASTATIN CALCIUM 80 MG PO TABS: 80.0000 mg | ORAL_TABLET | Freq: Every day | ORAL | Status: DC

## 2014-06-29 NOTE — Telephone Encounter (Signed)
Patient called and stated that she is going to have pay 65% for crestor, past PA have not been approved is there something else patient can try?

## 2014-06-29 NOTE — Telephone Encounter (Signed)
We can tray generic lipitor (atorvastatin ) 80 mg ,  rx sent to pharmacy

## 2014-06-30 ENCOUNTER — Other Ambulatory Visit: Payer: Self-pay | Admitting: Internal Medicine

## 2014-06-30 NOTE — Telephone Encounter (Signed)
Patient notified and voiced understanding.

## 2014-07-02 LAB — POTASSIUM: POTASSIUM: 4.8 mmol/L (ref 3.5–5.1)

## 2014-07-02 LAB — MAGNESIUM: Magnesium: 2 mg/dL

## 2014-07-03 LAB — KAPPA/LAMBDA FREE LIGHT CHAINS (ARMC)

## 2014-07-06 DIAGNOSIS — I714 Abdominal aortic aneurysm, without rupture, unspecified: Secondary | ICD-10-CM | POA: Diagnosis not present

## 2014-07-06 DIAGNOSIS — N2581 Secondary hyperparathyroidism of renal origin: Secondary | ICD-10-CM | POA: Diagnosis not present

## 2014-07-06 DIAGNOSIS — D631 Anemia in chronic kidney disease: Secondary | ICD-10-CM | POA: Diagnosis not present

## 2014-07-06 DIAGNOSIS — N183 Chronic kidney disease, stage 3 unspecified: Secondary | ICD-10-CM | POA: Diagnosis not present

## 2014-07-06 DIAGNOSIS — I10 Essential (primary) hypertension: Secondary | ICD-10-CM | POA: Diagnosis not present

## 2014-07-06 DIAGNOSIS — N039 Chronic nephritic syndrome with unspecified morphologic changes: Secondary | ICD-10-CM | POA: Diagnosis not present

## 2014-07-07 ENCOUNTER — Ambulatory Visit: Payer: Self-pay | Admitting: Internal Medicine

## 2014-07-07 DIAGNOSIS — E785 Hyperlipidemia, unspecified: Secondary | ICD-10-CM | POA: Diagnosis not present

## 2014-07-07 DIAGNOSIS — I2510 Atherosclerotic heart disease of native coronary artery without angina pectoris: Secondary | ICD-10-CM | POA: Diagnosis not present

## 2014-07-07 DIAGNOSIS — F411 Generalized anxiety disorder: Secondary | ICD-10-CM | POA: Diagnosis not present

## 2014-07-07 DIAGNOSIS — K219 Gastro-esophageal reflux disease without esophagitis: Secondary | ICD-10-CM | POA: Diagnosis not present

## 2014-07-07 DIAGNOSIS — R7989 Other specified abnormal findings of blood chemistry: Secondary | ICD-10-CM | POA: Diagnosis not present

## 2014-07-07 DIAGNOSIS — Z79899 Other long term (current) drug therapy: Secondary | ICD-10-CM | POA: Diagnosis not present

## 2014-07-07 DIAGNOSIS — M899 Disorder of bone, unspecified: Secondary | ICD-10-CM | POA: Diagnosis not present

## 2014-07-07 DIAGNOSIS — M949 Disorder of cartilage, unspecified: Secondary | ICD-10-CM | POA: Diagnosis not present

## 2014-07-15 DIAGNOSIS — Z79899 Other long term (current) drug therapy: Secondary | ICD-10-CM | POA: Diagnosis not present

## 2014-07-15 LAB — CREATININE, SERUM
CREATININE: 1.23 mg/dL (ref 0.60–1.30)
EGFR (African American): 51 — ABNORMAL LOW
EGFR (Non-African Amer.): 44 — ABNORMAL LOW

## 2014-07-15 LAB — MAGNESIUM: Magnesium: 2.1 mg/dL

## 2014-07-15 LAB — POTASSIUM: Potassium: 4.2 mmol/L (ref 3.5–5.1)

## 2014-07-21 DIAGNOSIS — I714 Abdominal aortic aneurysm, without rupture, unspecified: Secondary | ICD-10-CM | POA: Diagnosis not present

## 2014-07-28 ENCOUNTER — Ambulatory Visit (INDEPENDENT_AMBULATORY_CARE_PROVIDER_SITE_OTHER): Payer: Medicare Other | Admitting: Internal Medicine

## 2014-07-28 ENCOUNTER — Encounter: Payer: Self-pay | Admitting: Internal Medicine

## 2014-07-28 VITALS — BP 128/70 | HR 59 | Temp 98.5°F | Resp 16 | Ht 64.5 in | Wt 155.0 lb

## 2014-07-28 DIAGNOSIS — R198 Other specified symptoms and signs involving the digestive system and abdomen: Secondary | ICD-10-CM | POA: Diagnosis not present

## 2014-07-28 DIAGNOSIS — K2100 Gastro-esophageal reflux disease with esophagitis, without bleeding: Secondary | ICD-10-CM

## 2014-07-28 DIAGNOSIS — Z87891 Personal history of nicotine dependence: Secondary | ICD-10-CM

## 2014-07-28 DIAGNOSIS — I714 Abdominal aortic aneurysm, without rupture, unspecified: Secondary | ICD-10-CM | POA: Diagnosis not present

## 2014-07-28 DIAGNOSIS — E785 Hyperlipidemia, unspecified: Secondary | ICD-10-CM

## 2014-07-28 DIAGNOSIS — F411 Generalized anxiety disorder: Secondary | ICD-10-CM

## 2014-07-28 DIAGNOSIS — G454 Transient global amnesia: Secondary | ICD-10-CM | POA: Diagnosis not present

## 2014-07-28 DIAGNOSIS — F329 Major depressive disorder, single episode, unspecified: Secondary | ICD-10-CM

## 2014-07-28 DIAGNOSIS — F4381 Prolonged grief disorder: Secondary | ICD-10-CM

## 2014-07-28 DIAGNOSIS — Z23 Encounter for immunization: Secondary | ICD-10-CM | POA: Diagnosis not present

## 2014-07-28 DIAGNOSIS — R195 Other fecal abnormalities: Secondary | ICD-10-CM

## 2014-07-28 DIAGNOSIS — F4329 Adjustment disorder with other symptoms: Secondary | ICD-10-CM

## 2014-07-28 DIAGNOSIS — Z79899 Other long term (current) drug therapy: Secondary | ICD-10-CM

## 2014-07-28 MED ORDER — BUSPIRONE HCL 7.5 MG PO TABS: ORAL_TABLET | ORAL | Status: DC

## 2014-07-28 NOTE — Progress Notes (Signed)
Patient ID: Sabrina Montoya, female   DOB: 08-Oct-1941, 73 y.o.   MRN: [id number]   Patient Active Problem List   Diagnosis Date Noted  . Change in stool caliber 07/30/2014  . 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 Hot flashes 05/02/2014  . Anemia 05/02/2014  . Globus sensation 02/15/2014  . TIA (transient ischemic attack) 09/11/2013  . Dizziness and giddiness 09/02/2013  . Arrhythmia 07/01/2013  . Neuropathy 07/01/2013  . Overweight (BMI 25.0-29.9) 03/16/2013  . Anxiety state, unspecified 03/16/2013  . Chronic reflux esophagitis 12/29/2012  . Right hip pain 12/29/2012  . History of tobacco abuse 07/03/2012  . Hypomagnesemia 06/01/2012  . Bradycardia 06/01/2012  . CAD (coronary artery disease), autologous vein bypass graft   . Peripheral vascular disease   . Arthritis 02/29/2012  . Aneurysm of abdominal aorta 01/05/2012  . Asymptomatic carotid artery stenosis 01/05/2012  . Hyperlipidemia   . Osteopenia 08/29/2011  . Vitamin D deficiency 08/29/2011    Subjective:  CC:   Chief Complaint  Patient presents with  . Follow-up    Hypomagnesium   . Gastrophageal Reflux    Chronic.    HPI:   Sabrina Montoya is a 73 y.o. female who presents for  Follow up on multiple issues including multiple neurologic and gastrointestinal complaints and grief counseling following loss of husband to GBM .  40 minutes spent with patient today in face to face time.   Concerned that the lipitor is causing kidney problems,  Loose stools amd memory problems,  Has to write everything down   Bladder feels different since it has fallen a little,  Voiding Smaller amounts, more frequently,   Without back pain,  Dysuria, hematuria or  or suprapubic pain .  Change in stool .  Stools are persistently smaller caliber.   Cancelled appt with Dr. Candace Cruise for colonoscopy  because her sister in law suffered a perforation by him recently during colonoscopy .  Patient is alos having fecal urgency.  Wants to see [facility].  Last  colonoscopy 2010by outside GI Dr. Epimenio Foot.    chronic magnesium deficiency:  She has not required infusions the last 3 checks.  Last  magnesium rechecked by gitten sept 9th and normal.  Seeing her every 2 weeks .  Taking oral supplements of  Mg oxide 400 mg tiwce daily.    She has been taking a full strength aspirin since she saw Dr Delfin Edis and was hospitalized for TIA.Marland Kitchen MRI  Was normal; in fact reported less microvascular changes than normal for her age,  Wants to know if she can reduce aspirin dose.    She continues to have episodes where she  Is talking and suddenly loses the words she wants to say.  SUNEASY AT NIGHT,  IRRITABLE WITH OTHERS,   Not taking the buspirone.  Feels better when she leaves the house and travels,  Feels worse when he comes home to the empty house.  Daughter is managing the bills.  Recent visitor from her past made her feel better when  Visitor stated that Don's presence was still felt in the house  So "he had not left yet."  They had a good cry together, and since then she has been able to utter his name without crying.     Past Medical History  Diagnosis Date  . Diverticulitis of colon   . Rheumatic fever     possible at age 70  . Tobacco abuse   . Hyperlipidemia   . Abdominal aortic  aneurysm without mention of rupture     infrarenal, stable, folllowed by Leotis Pain  . CAD (coronary artery disease), autologous vein bypass graft   . Peripheral vascular disease     s/p CEA   . Tobacco abuse   . B12 deficiency   . Acute posthemorrhagic anemia   . Cardiac dysrhythmia, unspecified   . Neuropathy   . Reflux esophagitis   . Sliding hiatal hernia   . GERD (gastroesophageal reflux disease)   . Colon polyp     Past Surgical History  Procedure Laterality Date  . Closed manipulation shoulder      left shoulder post physical therapy, Right shoulder redo (toggle bolts)  . Rotator cuff repair  2002    right shoulder Dr. Nadean Corwin, [place] in [place]      Carotid stenosis found during workup for dysphagia,  [facility]  . Lumbar disc surgery  1971    L5, unssuccessful, fusion in 1983 successful (2 lumbar)  . Abdominal surgery  1976    for pain secondary to scar tissue, s/p apply  . 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 Hernia repair  may 2011    Dr. Pat Patrick  . Coronary artery bypass graft  2006    s/p triple bypass surgery, [facility]  . Cholecystectomy  2002    Dr. Pat Patrick  . Coronary angiography  01/2011    one occluded artery with collateralization, other arteries patent  . Hernia repair    . Appendectomy  1974       The following portions of the patient's history were reviewed and updated as appropriate: Allergies, current medications, and problem list.    Review of Systems:   Patient denies headache, fevers, malaise, unintentional weight loss, skin rash, eye pain, sinus congestion and sinus pain, sore throat, dysphagia,  hemoptysis , cough, dyspnea, wheezing, chest pain, palpitations, orthopnea, edema, abdominal pain, nausea, melena, diarrhea, constipation, flank pain, dysuria, hematuria, urinary  Frequency, nocturia, numbness, tingling, seizures,  Focal weakness, Loss of consciousness,  Tremor, insomnia, depression, anxiety, and suicidal ideation.     History   Social History  . Marital Status: Widowed    Spouse Name: N/A    Number of Children: N/A  . Years of Education: N/A   Occupational History  . Not on file.   Social History Main Topics  . Smoking status: Former Smoker -- 0.50 packs/day for 30 years    Types: Cigarettes    Quit date: 04/01/2012  . Smokeless tobacco: Never Used     Comment: quit for 2 years after sinus infection and 6 months after heart surgery  . Alcohol Use: No  . Drug Use: No  . Sexual Activity: Not on file   Other Topics Concern  . Not on file   Social History Narrative   Lives with spouse Sabrina Montoya    Objective:  Filed  Vitals:   07/28/14 1323  BP: 128/70  Pulse: 59  Temp: 98.5 F (36.9 C)  Resp: 16     General appearance: alert, cooperative and appears stated age Ears: normal TM's and external ear canals both ears Throat: lips, mucosa, and tongue normal; teeth and gums normal Neck: no adenopathy, no carotid bruit, supple, symmetrical, trachea midline and thyroid not enlarged, symmetric, no tenderness/mass/nodules Back: symmetric, no curvature. ROM normal. No CVA tenderness. Lungs: clear to auscultation bilaterally Heart: regular rate and rhythm, S1, S2 normal, no  murmur, click, rub or gallop Abdomen: soft, non-tender; bowel sounds normal; no masses,  no organomegaly Pulses: 2+ and symmetric Skin: Skin color, texture, turgor normal. No rashes or lesions Lymph nodes: Cervical, supraclavicular, and axillary nodes normal.  Assessment and Plan:  Aneurysm of abdominal aorta She is overdue for surveillance, due to husband's history for brain tumor and eventual demise .  Will reschedule for her   TIA (transient ischemic attack) Despite her history of PAD, her previous [facility] hospitalization for "TIA" symptoms noted a very clean MRI.  Ok to reduce aspirin to baby aspirin   Chronic reflux esophagitis No reflux was seen on recent barium swallow and EGD.  Given her chronically low mg requiring IV transfusions,  I have discontinued her PPI  And advised her to [place] famotidine 20 mg prn     Hyperlipidemia Discussed her concerns about statin therapy and suggested an alternative etiology (grief and anxiety , other causes of bladder and bowel issues) and suggested that for now, she should continue meds given history of CAD. LFTs have been normal more recently by outside studies   Lab Results  Component Value Date   CHOL 141 09/12/2013   HDL 29* 09/12/2013   LDLCALC 67 09/12/2013   LDLDIRECT 79.0 07/01/2013   TRIG 224* 09/12/2013   CHOLHDL 4.9 09/12/2013   Lab Results  Component Value Date   ALT 29 07/01/2013    AST 39* 07/01/2013   ALKPHOS 69 07/01/2013   BILITOT 0.2* 07/01/2013     Anxiety state, unspecified She has had multiple somatic complaints over the last year that have been fully evaluated.  Has not treated her anxiety consistently.  Agrees to trial of buspirone and return in one month  History of tobacco abuse Smoking cessation instruction/counseling given:  commended patient for quitting and reviewed strategies for preventing relapses  Change in stool caliber Small caliber stools for several months now,  Referral to Dr Bary Castilla for colonoscopy in process.   Prolonged grief reaction She has received counselling though Hospice  And today.  Spent 40 minutes total with patient today    Updated Medication List Outpatient Encounter Prescriptions as of 07/28/2014  Medication Sig  . aspirin 325 MG tablet Take 1 tablet (325 mg total) by mouth daily.  Marland Kitchen atorvastatin (LIPITOR) 80 MG tablet Take 1 tablet (80 mg total) by mouth daily.  . busPIRone (BUSPAR) 7.5 MG tablet TAKE 1 TABLET BY MOUTH TWO TO THREE TIMES A DAY  . cholecalciferol (VITAMIN D) 1000 UNITS tablet Take 1,000 Units by mouth daily.  Marland Kitchen conjugated estrogens (PREMARIN) vaginal cream Place 1 Applicatorful vaginally at bedtime.  . dicyclomine (BENTYL) 20 MG tablet Take 20 mg by mouth 2 (two) times daily.  . famotidine (PEPCID) 20 MG tablet TAKE 1 TABLET BY MOUTH TWICE DAILY BEFORE BREAKFAST AND DINNER  . magnesium oxide (MAG-OX) 400 (241.3 MG) MG tablet TAKE ONE TABLET BY MOUTH TWICE DAILY  . metoprolol tartrate (LOPRESSOR) 25 MG tablet TAKE 1/2 TABLET BY MOUTH TWICE DAILY  . nitroGLYCERIN (NITROSTAT) 0.4 MG SL tablet Place 0.4 mg under the tongue every 5 (five) minutes as needed. As needed for chest pain   . ondansetron (ZOFRAN) 4 MG tablet Take 1 tablet (4 mg total) by mouth every 8 (eight) hours as needed for nausea or vomiting.  . [DISCONTINUED] albuterol (PROVENTIL HFA;VENTOLIN HFA) 108 (90 BASE) MCG/ACT inhaler Inhale 2 puffs into  the lungs every 6 (six) hours as needed for wheezing.  . [DISCONTINUED] albuterol (PROVENTIL HFA;VENTOLIN HFA)  108 (90 BASE) MCG/ACT inhaler Inhale 1 puff into the lungs daily as needed.  . [DISCONTINUED] busPIRone (BUSPAR) 7.5 MG tablet TAKE 1 TABLET BY MOUTH THREE TIMES A DAY  . [DISCONTINUED] CRESTOR 20 MG tablet TAKE 1 TABLET DAILY  . [DISCONTINUED] diclofenac (VOLTAREN) 75 MG EC tablet Take 1 tablet (75 mg total) by mouth 2 (two) times daily.  . [DISCONTINUED] estradiol (ESTRACE) 0.1 MG/GM vaginal cream Use nightly for two weeks,  Then twice weekly thereafter  . [DISCONTINUED] polyethylene glycol (MIRALAX / GLYCOLAX) packet Take 17 g by mouth daily.  . [DISCONTINUED] traMADol (ULTRAM) 50 MG tablet Take 1 tablet (50 mg total) by mouth every 8 (eight) hours as needed for pain.     Orders Placed This Encounter  Procedures  . Hepatic function panel  . Ambulatory referral to General Surgery  . Ambulatory referral to Vascular Surgery  . HM COLONOSCOPY    Return in about 4 weeks (around 08/25/2014).

## 2014-07-28 NOTE — Progress Notes (Signed)
Pre-visit discussion using our clinic review tool. No additional management support is needed unless otherwise documented below in the visit note.  

## 2014-07-28 NOTE — Patient Instructions (Addendum)
You can resume the baby aspirin daily instead of the full strength aspirin,    I say this because your MRI of the brain looked so good last December  Resume the buspirone two to three times daily  Return in one month  Make sure you have "quiet time" every day to unload your brain.  Too much multi tasking isn't good

## 2014-07-29 LAB — CREATININE, SERUM
Creatinine: 1.34 mg/dL — ABNORMAL HIGH (ref 0.60–1.30)
EGFR (Non-African Amer.): 39 — ABNORMAL LOW
GFR CALC AF AMER: 46 — AB

## 2014-07-29 LAB — MAGNESIUM: Magnesium: 2.2 mg/dL

## 2014-07-29 LAB — POTASSIUM: Potassium: 4.7 mmol/L (ref 3.5–5.1)

## 2014-07-30 ENCOUNTER — Encounter: Payer: Self-pay | Admitting: Internal Medicine

## 2014-07-30 DIAGNOSIS — F4329 Adjustment disorder with other symptoms: Secondary | ICD-10-CM | POA: Insufficient documentation

## 2014-07-30 DIAGNOSIS — R195 Other fecal abnormalities: Secondary | ICD-10-CM | POA: Insufficient documentation

## 2014-07-30 DIAGNOSIS — F4381 Prolonged grief disorder: Secondary | ICD-10-CM | POA: Insufficient documentation

## 2014-07-30 NOTE — Assessment & Plan Note (Signed)
She is overdue for surveillance, due to husband's history for brain tumor and eventual demise .  Will reschedule for her

## 2014-07-30 NOTE — Assessment & Plan Note (Signed)
She has had multiple somatic complaints over the last year that have been fully evaluated.  Has not treated her anxiety consistently.  Agrees to trial of buspirone and return in one month

## 2014-07-30 NOTE — Assessment & Plan Note (Signed)
No reflux was seen on recent barium swallow and EGD.  Given her chronically low mg requiring IV transfusions,  I have discontinued her PPI  And advised her to [place] famotidine 20 mg prn

## 2014-07-30 NOTE — Assessment & Plan Note (Signed)
Smoking cessation instruction/counseling given:  commended patient for quitting and reviewed strategies for preventing relapses 

## 2014-07-30 NOTE — Assessment & Plan Note (Signed)
Despite her history of PAD, her previous [facility] hospitalization for "TIA" symptoms noted a very clean MRI.  Ok to reduce aspirin to baby aspirin

## 2014-07-30 NOTE — Assessment & Plan Note (Signed)
She has received counselling though Hospice  And today.  Spent 40 minutes total with patient today

## 2014-07-30 NOTE — Assessment & Plan Note (Signed)
Discussed her concerns about statin therapy and suggested an alternative etiology (grief and anxiety , other causes of bladder and bowel issues) and suggested that for now, she should continue meds given history of CAD. LFTs have been normal more recently by outside studies   Lab Results  Component Value Date   CHOL 141 09/12/2013   HDL 29* 09/12/2013   LDLCALC 67 09/12/2013   LDLDIRECT 79.0 07/01/2013   TRIG 224* 09/12/2013   CHOLHDL 4.9 09/12/2013   Lab Results  Component Value Date   ALT 29 07/01/2013   AST 39* 07/01/2013   ALKPHOS 69 07/01/2013   BILITOT 0.2* 07/01/2013

## 2014-07-30 NOTE — Assessment & Plan Note (Signed)
Small caliber stools for several months now,  Referral to Dr Bary Castilla for colonoscopy in process.

## 2014-07-31 ENCOUNTER — Other Ambulatory Visit: Payer: Self-pay | Admitting: Internal Medicine

## 2014-07-31 NOTE — Telephone Encounter (Signed)
Medication not on pts current med list.  Please advise

## 2014-08-03 NOTE — Telephone Encounter (Signed)
Ok to refill, with changes made  Fax printed rx to pharmacy once signed   

## 2014-08-03 NOTE — Telephone Encounter (Signed)
Faxed to pharmacy

## 2014-08-04 ENCOUNTER — Encounter: Payer: Self-pay | Admitting: Internal Medicine

## 2014-08-06 ENCOUNTER — Ambulatory Visit: Payer: Self-pay | Admitting: Internal Medicine

## 2014-08-06 DIAGNOSIS — F419 Anxiety disorder, unspecified: Secondary | ICD-10-CM | POA: Diagnosis not present

## 2014-08-06 DIAGNOSIS — R799 Abnormal finding of blood chemistry, unspecified: Secondary | ICD-10-CM | POA: Diagnosis not present

## 2014-08-06 DIAGNOSIS — K219 Gastro-esophageal reflux disease without esophagitis: Secondary | ICD-10-CM | POA: Diagnosis not present

## 2014-08-06 DIAGNOSIS — M899 Disorder of bone, unspecified: Secondary | ICD-10-CM | POA: Diagnosis not present

## 2014-08-06 DIAGNOSIS — Z79899 Other long term (current) drug therapy: Secondary | ICD-10-CM | POA: Diagnosis not present

## 2014-08-06 DIAGNOSIS — I2510 Atherosclerotic heart disease of native coronary artery without angina pectoris: Secondary | ICD-10-CM | POA: Diagnosis not present

## 2014-08-12 DIAGNOSIS — I2510 Atherosclerotic heart disease of native coronary artery without angina pectoris: Secondary | ICD-10-CM | POA: Diagnosis not present

## 2014-08-12 DIAGNOSIS — R799 Abnormal finding of blood chemistry, unspecified: Secondary | ICD-10-CM | POA: Diagnosis not present

## 2014-08-12 DIAGNOSIS — K219 Gastro-esophageal reflux disease without esophagitis: Secondary | ICD-10-CM | POA: Diagnosis not present

## 2014-08-12 DIAGNOSIS — F419 Anxiety disorder, unspecified: Secondary | ICD-10-CM | POA: Diagnosis not present

## 2014-08-12 DIAGNOSIS — Z79899 Other long term (current) drug therapy: Secondary | ICD-10-CM | POA: Diagnosis not present

## 2014-08-12 LAB — CBC CANCER CENTER
Basophil #: 0 x10 3/mm (ref 0.0–0.1)
Basophil %: 0.7 %
EOS PCT: 4.4 %
Eosinophil #: 0.2 x10 3/mm (ref 0.0–0.7)
HCT: 33.2 % — ABNORMAL LOW (ref 35.0–47.0)
HGB: 11 g/dL — ABNORMAL LOW (ref 12.0–16.0)
LYMPHS ABS: 2 x10 3/mm (ref 1.0–3.6)
LYMPHS PCT: 42.9 %
MCH: 28.6 pg (ref 26.0–34.0)
MCHC: 33.2 g/dL (ref 32.0–36.0)
MCV: 86 fL (ref 80–100)
MONO ABS: 0.4 x10 3/mm (ref 0.2–0.9)
Monocyte %: 9.1 %
Neutrophil #: 2 x10 3/mm (ref 1.4–6.5)
Neutrophil %: 42.9 %
Platelet: 200 x10 3/mm (ref 150–440)
RBC: 3.84 10*6/uL (ref 3.80–5.20)
RDW: 13.6 % (ref 11.5–14.5)
WBC: 4.7 x10 3/mm (ref 3.6–11.0)

## 2014-08-12 LAB — MAGNESIUM: MAGNESIUM: 2.1 mg/dL

## 2014-08-12 LAB — POTASSIUM: Potassium: 4.5 mmol/L (ref 3.5–5.1)

## 2014-08-17 ENCOUNTER — Encounter: Payer: Self-pay | Admitting: General Surgery

## 2014-08-17 ENCOUNTER — Ambulatory Visit (INDEPENDENT_AMBULATORY_CARE_PROVIDER_SITE_OTHER): Payer: Medicare Other | Admitting: General Surgery

## 2014-08-17 VITALS — BP 122/78 | HR 60 | Resp 12 | Ht 65.0 in | Wt 156.0 lb

## 2014-08-17 DIAGNOSIS — R194 Change in bowel habit: Secondary | ICD-10-CM | POA: Diagnosis not present

## 2014-08-17 LAB — HEMOCCULT GUIAC POC 1CARD (OFFICE): Fecal Occult Blood, POC: NEGATIVE

## 2014-08-17 MED ORDER — POLYETHYLENE GLYCOL 3350 17 GM/SCOOP PO POWD: ORAL | Status: DC

## 2014-08-17 NOTE — Patient Instructions (Addendum)
Patient to be scheduled for a colonoscopy.   Colonoscopy A colonoscopy is an exam to look at the entire large intestine (colon). This exam can help find problems such as tumors, polyps, inflammation, and areas of bleeding. The exam takes about 1 hour.  LET [facility] CARE PROVIDER KNOW ABOUT:   Any allergies you have.  All medicines you are taking, including vitamins, herbs, eye drops, creams, and over-the-counter medicines.  Previous problems you or members of your family have had with the use of anesthetics.  Any blood disorders you have.  Previous surgeries you have had.  Medical conditions you have. RISKS AND COMPLICATIONS  Generally, this is a safe procedure. However, as with any procedure, complications can occur. Possible complications include:  Bleeding.  Tearing or rupture of the colon wall.  Reaction to medicines given during the exam.  Infection (rare). BEFORE THE PROCEDURE   Ask your health care provider about changing or stopping your regular medicines.  You may be prescribed an oral bowel prep. This involves drinking a large amount of medicated liquid, starting the day before your procedure. The liquid will cause you to have multiple loose stools until your stool is almost clear or light green. This cleans out your colon in preparation for the procedure.  Do not eat or drink anything else once you have started the bowel prep, unless your health care provider tells you it is safe to do so.  Arrange for someone to drive you home after the procedure. PROCEDURE   You will be given medicine to help you relax (sedative).  You will lie on your side with your knees bent.  A long, flexible tube with a light and camera on the end (colonoscope) will be inserted through the rectum and into the colon. The camera sends video back to a computer screen as it moves through the colon. The colonoscope also releases carbon dioxide gas to inflate the colon. This helps your health  care provider see the area better.  During the exam, your health care provider may take a small tissue sample (biopsy) to be examined under a microscope if any abnormalities are found.  The exam is finished when the entire colon has been viewed. AFTER THE PROCEDURE   Do not drive for 24 hours after the exam.  You may have a small amount of blood in your stool.  You may pass moderate amounts of gas and have mild abdominal cramping or bloating. This is caused by the gas used to inflate your colon during the exam.  Ask when your test results will be ready and how you will get your results. Make sure you get your test results. Document Released: 10/20/2000 Document Revised: 08/13/2013 Document Reviewed: 06/30/2013 [facility] Patient Information 2015 [place], [place]. This information is not intended to replace advice given to you by your health care provider. Make sure you discuss any questions you have with your health care provider.  Patient has been scheduled for a colonoscopy on 09-01-14 at [facility]. It is okay for patient to continue 81 mg aspirin.

## 2014-08-17 NOTE — Progress Notes (Signed)
Patient ID: Sabrina Montoya, female   DOB: 1941/01/22, 73 y.o.   MRN: [id number]  Chief Complaint  Patient presents with  . Other    New Pt evaluation of change in bowel habits    HPI Sabrina Montoya is a 73 y.o. female who presents for an evaluation of new bowel habits. The patient has a history of colon polyps. Her last colonoscopy was performed in 2010. The patient states she has had more frequent bowel movements. She states she stopped counting at 8 bowel movements a day 6 weeks ago. She also states she has had change in the color of bowel movements from dark brown to caramel colored. The bowel movements also go from being large in size to pencil shaped. She denies any blood in the stools. The patient has a history of diverticulitis. She has been doing magnesium infusions for approximately 6-7 months. She does see mucus in her bowels a few times. Gaining weight recently.  The patient has been taking oral magnesium supplements for over one year because of hypomagnesemia.  This predated her present GI troubles. The patient reports that her husband died earlier this spring, they've been married for over 56 years. For some question she was unclear mainly due to the trauma over her husband's recent death. (Glioblastoma).    HPI  Past Medical History  Diagnosis Date  . Diverticulitis of colon   . Rheumatic fever     possible at age 38  . Tobacco abuse   . Hyperlipidemia   . Abdominal aortic aneurysm without mention of rupture     infrarenal, stable, folllowed by Leotis Pain  . CAD (coronary artery disease), autologous vein bypass graft   . Peripheral vascular disease     s/p CEA   . Tobacco abuse   . B12 deficiency   . Acute posthemorrhagic anemia   . Cardiac dysrhythmia, unspecified   . Neuropathy   . Reflux esophagitis   . Sliding hiatal hernia   . GERD (gastroesophageal reflux disease)   . Colon polyp   . IBS (irritable bowel syndrome)     Past Surgical History  Procedure Laterality Date   . Closed manipulation shoulder      left shoulder post physical therapy, Right shoulder redo (toggle bolts)  . Rotator cuff repair  2002    right shoulder Dr. Nadean Corwin, [place] in [place]      Carotid stenosis found during workup for dysphagia,  [facility]  . Lumbar disc surgery  1971    L5, unssuccessful, fusion in 1983 successful (2 lumbar)  . Abdominal surgery  1976    for pain secondary to scar tissue, s/p apply  . 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 Hernia repair  may 2011    Dr. Pat Patrick  . Coronary artery bypass graft  2006    s/p triple bypass surgery, [facility]  . Cholecystectomy  2002    Dr. Pat Patrick  . Coronary angiography  01/2011    one occluded artery with collateralization, other arteries patent  . Hernia repair    . Appendectomy  1974    Family History  Problem Relation Age of Onset  . Cancer Sister     cervical cancer  . Diabetes Sister   . Heart disease Brother     coronary artery disease  . Other Brother     suicide  . Diabetes Mother   .  Heart disease Mother   . Diabetes Sister   . Other Father     suicide  . Cancer Sister     breast  . Other Other     colon resection due to inflammation -nephew    Social History History  Substance Use Topics  . Smoking status: Former Smoker -- 0.50 packs/day for 30 years    Types: Cigarettes    Quit date: 04/01/2012  . Smokeless tobacco: Never Used     Comment: quit for 2 years after sinus infection and 6 months after heart surgery  . Alcohol Use: No    Allergies  Allergen Reactions  . Niacin And Related Hives  . Sulfa Drugs Cross Reactors Nausea Only  . Tetracyclines & Related Hives  . Latex Rash  . Simvastatin Rash    *Antihyperlipidemics*; elevated LFT's.     Current Outpatient Prescriptions  Medication Sig Dispense Refill  . aspirin 325 MG tablet Take 1 tablet (325 mg total) by mouth daily.  30 tablet  0  . atorvastatin  (LIPITOR) 80 MG tablet Take 1 tablet (80 mg total) by mouth daily.  90 tablet  3  . busPIRone (BUSPAR) 7.5 MG tablet TAKE 1 TABLET BY MOUTH TWO TO THREE TIMES A DAY  90 tablet  5  . cholecalciferol (VITAMIN D) 1000 UNITS tablet Take 1,000 Units by mouth daily.      . clonazePAM (KLONOPIN) 0.5 MG tablet TAKE 1 TABLET BY MOUTH TWICE DAILY AS NEEDED.  60 tablet  3  . conjugated estrogens (PREMARIN) vaginal cream Place 1 Applicatorful vaginally at bedtime.  42.5 g  12  . dicyclomine (BENTYL) 20 MG tablet Take 20 mg by mouth 2 (two) times daily.      . famotidine (PEPCID) 20 MG tablet TAKE 1 TABLET BY MOUTH TWICE DAILY BEFORE BREAKFAST AND DINNER  60 tablet  2  . magnesium oxide (MAG-OX) 400 (241.3 MG) MG tablet TAKE ONE TABLET BY MOUTH TWICE DAILY  60 tablet  2  . metoprolol tartrate (LOPRESSOR) 25 MG tablet TAKE 1/2 TABLET BY MOUTH TWICE DAILY  180 tablet  1  . nitroGLYCERIN (NITROSTAT) 0.4 MG SL tablet Place 0.4 mg under the tongue every 5 (five) minutes as needed. As needed for chest pain       . polyethylene glycol powder (GLYCOLAX/MIRALAX) powder 255 grams one bottle for colonoscopy prep  255 g  0   No current facility-administered medications for this visit.    Review of Systems Review of Systems  Constitutional: Negative.   Respiratory: Negative.   Cardiovascular: Negative.   Gastrointestinal: Negative.     Blood pressure 122/78, pulse 60, resp. rate 12, height 5\' 5"  (1.651 m), weight 156 lb (70.761 kg).  Physical Exam Physical Exam  Constitutional: She is oriented to person, place, and time. She appears well-developed and well-nourished.  Neck: Neck supple. No thyromegaly present.  Cardiovascular: Normal rate, regular rhythm and normal heart sounds.   No murmur heard. Pulmonary/Chest: Effort normal and breath sounds normal.  Abdominal: Soft. Normal appearance and bowel sounds are normal. There is no hepatosplenomegaly. There is no tenderness.  Lymphadenopathy:    She has no  cervical adenopathy.  Neurological: She is alert and oriented to person, place, and time.  Skin: Skin is warm and dry.    Data Reviewed Laboratory studies dated August 12, 2014 showed a hemoglobin of 11.0 with MCV of 86, white blood cell count of 4700 with normal differential. Magnesium 2.1 (1.8-2.4). Potassium 4.5.  Assessment  Change in bowel habits, unlikely related to oral magnesium supplements based on temporal history.     Plan    Patient has been scheduled for a colonoscopy on 09-01-14 at [facility]. It is okay for patient to continue 81 mg aspirin.     PCP/Ref MD: Mattie [name] 08/18/2014, 8:29 PM

## 2014-08-18 ENCOUNTER — Other Ambulatory Visit: Payer: Self-pay | Admitting: General Surgery

## 2014-08-18 DIAGNOSIS — R194 Change in bowel habit: Secondary | ICD-10-CM | POA: Insufficient documentation

## 2014-08-26 ENCOUNTER — Encounter: Payer: Self-pay | Admitting: Internal Medicine

## 2014-08-26 ENCOUNTER — Ambulatory Visit: Payer: Self-pay | Admitting: Internal Medicine

## 2014-08-26 ENCOUNTER — Ambulatory Visit (INDEPENDENT_AMBULATORY_CARE_PROVIDER_SITE_OTHER): Payer: Medicare Other | Admitting: Internal Medicine

## 2014-08-26 VITALS — BP 130/72 | HR 68 | Temp 98.3°F | Resp 16 | Ht 65.0 in | Wt 159.5 lb

## 2014-08-26 DIAGNOSIS — I2510 Atherosclerotic heart disease of native coronary artery without angina pectoris: Secondary | ICD-10-CM | POA: Diagnosis not present

## 2014-08-26 DIAGNOSIS — F4381 Prolonged grief disorder: Secondary | ICD-10-CM

## 2014-08-26 DIAGNOSIS — M25552 Pain in left hip: Secondary | ICD-10-CM

## 2014-08-26 DIAGNOSIS — F4329 Adjustment disorder with other symptoms: Secondary | ICD-10-CM

## 2014-08-26 DIAGNOSIS — N63 Unspecified lump in breast: Secondary | ICD-10-CM | POA: Diagnosis not present

## 2014-08-26 DIAGNOSIS — N8110 Cystocele, unspecified: Secondary | ICD-10-CM

## 2014-08-26 DIAGNOSIS — M25551 Pain in right hip: Secondary | ICD-10-CM | POA: Diagnosis not present

## 2014-08-26 DIAGNOSIS — Z1239 Encounter for other screening for malignant neoplasm of breast: Secondary | ICD-10-CM

## 2014-08-26 DIAGNOSIS — N393 Stress incontinence (female) (male): Secondary | ICD-10-CM | POA: Diagnosis not present

## 2014-08-26 DIAGNOSIS — Z23 Encounter for immunization: Secondary | ICD-10-CM

## 2014-08-26 DIAGNOSIS — F411 Generalized anxiety disorder: Secondary | ICD-10-CM | POA: Diagnosis not present

## 2014-08-26 DIAGNOSIS — F419 Anxiety disorder, unspecified: Secondary | ICD-10-CM | POA: Diagnosis not present

## 2014-08-26 DIAGNOSIS — M1611 Unilateral primary osteoarthritis, right hip: Secondary | ICD-10-CM | POA: Diagnosis not present

## 2014-08-26 DIAGNOSIS — IMO0002 Reserved for concepts with insufficient information to code with codable children: Secondary | ICD-10-CM

## 2014-08-26 DIAGNOSIS — F4321 Adjustment disorder with depressed mood: Secondary | ICD-10-CM

## 2014-08-26 DIAGNOSIS — G8929 Other chronic pain: Secondary | ICD-10-CM

## 2014-08-26 DIAGNOSIS — K219 Gastro-esophageal reflux disease without esophagitis: Secondary | ICD-10-CM | POA: Diagnosis not present

## 2014-08-26 DIAGNOSIS — N6310 Unspecified lump in the right breast, unspecified quadrant: Secondary | ICD-10-CM

## 2014-08-26 DIAGNOSIS — R799 Abnormal finding of blood chemistry, unspecified: Secondary | ICD-10-CM | POA: Diagnosis not present

## 2014-08-26 DIAGNOSIS — N644 Mastodynia: Secondary | ICD-10-CM

## 2014-08-26 DIAGNOSIS — Z79899 Other long term (current) drug therapy: Secondary | ICD-10-CM | POA: Diagnosis not present

## 2014-08-26 LAB — MAGNESIUM: Magnesium: 2.3 mg/dL

## 2014-08-26 MED ORDER — MIRTAZAPINE 15 MG PO TBDP: 7.5000 mg | ORAL_TABLET | Freq: Every day | ORAL | Status: DC

## 2014-08-26 NOTE — Progress Notes (Signed)
Pre-visit discussion using our clinic review tool. No additional management support is needed unless otherwise documented below in the visit note.  

## 2014-08-26 NOTE — Progress Notes (Signed)
Patient ID: Sabrina Montoya, female   DOB: 06/26/1941, 73 y.o.   MRN: [id number]     Patient Active Problem List   Diagnosis Date Noted  . Chronic pain of right hip 08/29/2014  . Cystocele 08/29/2014  . Breast pain 08/29/2014  . Painful lumpy right breast 08/29/2014  . Change in bowel habits 08/18/2014  . Change in stool caliber 07/30/2014  . 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 Hot flashes 05/02/2014  . Anemia 05/02/2014  . Globus sensation 02/15/2014  . TIA (transient ischemic attack) 09/11/2013  . Dizziness and giddiness 09/02/2013  . Arrhythmia 07/01/2013  . Neuropathy 07/01/2013  . Overweight (BMI 25.0-29.9) 03/16/2013  . Anxiety state 03/16/2013  . Chronic reflux esophagitis 12/29/2012  . Right hip pain 12/29/2012  . History of tobacco abuse 07/03/2012  . Hypomagnesemia 06/01/2012  . Bradycardia 06/01/2012  . CAD (coronary artery disease), autologous vein bypass graft   . Peripheral vascular disease   . Arthritis 02/29/2012  . Aneurysm of abdominal aorta 01/05/2012  . Asymptomatic carotid artery stenosis 01/05/2012  . Hyperlipidemia   . Osteopenia 08/29/2011  . Vitamin D deficiency 08/29/2011    Subjective:  CC:   Chief Complaint  Patient presents with  . Follow-up    Anxiety and memory, feels jumpie inside.    HPI:   Sabrina Montoya is a 73 y.o. female who presents for One month Follow up on complicated grief reaction following the death of her husband.  She has had a very difficult time adjusting to life without [name].  She continues to suffer daily from [place] and anxiety.  Using 1/2 klonipni twice daily,  Not using buspirone  As directed since it hasn't helped with regular use.    She has multiple other issues today.   Wants to be referred to urogynecologist for bladder prolapse.  Hasn't seen gyn since Dr Rayford Halsted retired.   Breast tenderness,  She developed irritation and tenderness on  both breasts several weeks ago after wearing an old  nightgown to bed .   Breasts still bothering her . tender right lateral breast   Her right hip has been very painful for the past 2 to 3 weeks.  The pain is lateral and aggravated by lying on it, and by going up steps.  Last x rays were June 2014      Past Medical History  Diagnosis Date  . Diverticulitis of colon   . Rheumatic fever     possible at age 46  . Tobacco abuse   . Hyperlipidemia   . Abdominal aortic aneurysm without mention of rupture     infrarenal, stable, folllowed by Leotis Pain  . CAD (coronary artery disease), autologous vein bypass graft   . Peripheral vascular disease     s/p CEA   . Tobacco abuse   . B12 deficiency   . Acute posthemorrhagic anemia   . Cardiac dysrhythmia, unspecified   . Neuropathy   . Reflux esophagitis   . Sliding hiatal hernia   . GERD (gastroesophageal reflux disease)   . Colon polyp   . IBS (irritable bowel syndrome)     Past Surgical History  Procedure Laterality Date  . Closed manipulation shoulder      left shoulder post physical therapy, Right shoulder redo (toggle bolts)  . Rotator cuff repair  2002    right shoulder Dr. Nadean Corwin, [place] in [place]      Carotid stenosis found during workup for dysphagia,  [facility]  . Lumbar disc surgery  1971    L5, unssuccessful, fusion in 1983 successful (2 lumbar)  . Abdominal surgery  1976    for pain secondary to scar tissue, s/p apply  . 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 Hernia repair  may 2011    Dr. Pat Patrick  . Coronary artery bypass graft  2006    s/p triple bypass surgery, [facility]  . Cholecystectomy  2002    Dr. Pat Patrick  . Coronary angiography  01/2011    one occluded artery with collateralization, other arteries patent  . Hernia repair    . Appendectomy  1974       The following portions of the patient's history were reviewed and updated as appropriate: Allergies, current medications, and problem  list.    Review of Systems:   Patient denies headache, fevers, malaise, unintentional weight loss, skin rash, eye pain, sinus congestion and sinus pain, sore throat, dysphagia,  hemoptysis , cough, dyspnea, wheezing, chest pain, palpitations, orthopnea, edema, abdominal pain, nausea, melena, diarrhea, constipation, flank pain, dysuria, hematuria, urinary  Frequency, nocturia, numbness, tingling, seizures,  Focal weakness, Loss of consciousness,  Tremor, insomnia, depression, anxiety, and suicidal ideation.     History   Social History  . Marital Status: Widowed    Spouse Name: N/A    Number of Children: N/A  . Years of Education: N/A   Occupational History  . Not on file.   Social History Main Topics  . Smoking status: Former Smoker -- 0.50 packs/day for 30 years    Types: Cigarettes    Quit date: 04/01/2012  . Smokeless tobacco: Never Used     Comment: quit for 2 years after sinus infection and 6 months after heart surgery  . Alcohol Use: No  . Drug Use: No  . Sexual Activity: Not on file   Other Topics Concern  . Not on file   Social History Narrative   Lives with spouse [name]    Objective:  Filed Vitals:   08/26/14 1436  BP: 130/72  Pulse: 68  Temp: 98.3 F (36.8 C)  Resp: 16    General appearance: alert, cooperative and appears tired,  stated age Head: Normocephalic, without obvious abnormality, atraumatic Eyes: conjunctivae/corneas clear. PERRL, EOM's intact. Fundi benign. Ears: normal TM's and external ear canals both ears Nose: Nares normal. Septum midline. Mucosa normal. No drainage or sinus tenderness. Throat: lips, mucosa, and tongue normal; teeth and gums normal Neck: no adenopathy, no carotid bruit, no JVD, supple, symmetrical, trachea midline and thyroid not enlarged, symmetric, no tenderness/mass/nodules Lungs: clear to auscultation bilaterally Breasts: right breast lumpy and tender in U/o quadrant,  Left breast normal appearance, no masses or  tenderness Heart: regular rate and rhythm, S1, S2 normal, no murmur, click, rub or gallop Abdomen: soft, non-tender; bowel sounds normal; no masses,  no organomegaly Extremities: extremities normal, atraumatic, no cyanosis or edema MSK: right hip with decreased ROM due to pain,  No redness or warmth. Pulses: 2+ and symmetric Skin: Skin color, texture, turgor normal. No rashes or lesions Neurologic: Alert and oriented X 3, normal strength and tone. Normal symmetric reflexes. Normal coordination and gait.  Psych:  Depressed affect, cries easily,  Appears anxious.  Makes good eye contact.   Assessment and Plan:  Anxiety state No improvement with buspirone  Continue low dose clonazepam bid for now.    Prolonged grief reaction Adding low dose remeron  at bedtime to manage insomnia, loss of appetite .  return in one month   Chronic pain of right hip She has only mild degenerative changes of the hip joint  With some loss of joint space and mild spurring by repeat plain films done at [facility].  Will refer to Philmore Pali for evaluation   Cystocele She is having increasing discomfort and is requesting referral to Dr Sharlett Iles .  Referral is in process as requested  Painful lumpy right breast She is due for mammogram.  Breasts are lumpy on exam  today and she is tender in the upper outer corner of her right breast.  Diagnositc mammogra ordered   A total of 40 minutes was spent with patient more than half of which was spent in counseling and evaluation of patient on the above mentioned issues  and coordination of care.   Updated Medication List Outpatient Encounter Prescriptions as of 08/26/2014  Medication Sig  . aspirin 81 MG tablet Take 81 mg by mouth daily.  Marland Kitchen atorvastatin (LIPITOR) 80 MG tablet Take 1 tablet (80 mg total) by mouth daily.  . busPIRone (BUSPAR) 7.5 MG tablet TAKE 1 TABLET BY MOUTH TWO TO THREE TIMES A DAY  . cholecalciferol (VITAMIN D) 1000 UNITS tablet Take 1,000 Units by  mouth daily.  . clonazePAM (KLONOPIN) 0.5 MG tablet TAKE 1 TABLET BY MOUTH TWICE DAILY AS NEEDED.  Marland Kitchen conjugated estrogens (PREMARIN) vaginal cream Place 1 Applicatorful vaginally at bedtime.  . dicyclomine (BENTYL) 20 MG tablet Take 20 mg by mouth 2 (two) times daily.  . famotidine (PEPCID) 20 MG tablet TAKE 1 TABLET BY MOUTH TWICE DAILY BEFORE BREAKFAST AND DINNER  . magnesium oxide (MAG-OX) 400 (241.3 MG) MG tablet TAKE ONE TABLET BY MOUTH TWICE DAILY  . metoprolol tartrate (LOPRESSOR) 25 MG tablet TAKE 1/2 TABLET BY MOUTH TWICE DAILY  . nitroGLYCERIN (NITROSTAT) 0.4 MG SL tablet Place 0.4 mg under the tongue every 5 (five) minutes as needed. As needed for chest pain   . polyethylene glycol powder (GLYCOLAX/MIRALAX) powder 255 grams one bottle for colonoscopy prep  . mirtazapine (REMERON SOL-TAB) 15 MG disintegrating tablet Take 0.5 tablets (7.5 mg total) by mouth at bedtime. For two weeks, then increase to 1 tablet weekly  . [DISCONTINUED] aspirin 325 MG tablet Take 1 tablet (325 mg total) by mouth daily.     Orders Placed This Encounter  Procedures  . DG Hip Complete Right  . MM Digital Screening Unilat L  . MM Digital Diagnostic Unilat R  . Pneumococcal conjugate vaccine 13-valent  . Ambulatory referral to Urogynecology    No Follow-up on file.

## 2014-08-26 NOTE — Patient Instructions (Signed)
Am recommending a trial of generic remeron to take at 9 pm.  Start with 1/2 tablet for first week or two  Increase to a full tablet after 1-2 weeks  This is an antidepressant;  You can still use your clonazepam not more than twice daily ,  But you can stop the buspirone  Referral to Dr Sharlett Iles in process for your incontinence

## 2014-08-27 LAB — KAPPA/LAMBDA FREE LIGHT CHAINS (ARMC)

## 2014-08-29 ENCOUNTER — Telehealth: Payer: Self-pay | Admitting: Internal Medicine

## 2014-08-29 DIAGNOSIS — M25551 Pain in right hip: Secondary | ICD-10-CM

## 2014-08-29 DIAGNOSIS — N6310 Unspecified lump in the right breast, unspecified quadrant: Secondary | ICD-10-CM | POA: Insufficient documentation

## 2014-08-29 DIAGNOSIS — N644 Mastodynia: Secondary | ICD-10-CM | POA: Insufficient documentation

## 2014-08-29 DIAGNOSIS — IMO0002 Reserved for concepts with insufficient information to code with codable children: Secondary | ICD-10-CM | POA: Insufficient documentation

## 2014-08-29 DIAGNOSIS — G8929 Other chronic pain: Secondary | ICD-10-CM | POA: Insufficient documentation

## 2014-08-29 NOTE — Assessment & Plan Note (Signed)
Adding low dose remeron at bedtime to manage insomnia, loss of appetite .  return in one month

## 2014-08-29 NOTE — Assessment & Plan Note (Signed)
She is due for mammogram.  Breasts are lumpy on exam  today and she is tender in the upper outer corner of her right breast.  Diagnositc mammogra ordered

## 2014-08-29 NOTE — Assessment & Plan Note (Addendum)
She has only mild degenerative changes of the hip joint  With some loss of joint space and mild spurring by repeat plain films done at [facility].  Will refer to Sabrina Montoya for evaluation

## 2014-08-29 NOTE — Assessment & Plan Note (Signed)
She is having increasing discomfort and is requesting referral to Dr Sharlett Iles .  Referral is in process as requested

## 2014-08-29 NOTE — Assessment & Plan Note (Signed)
No improvement with buspirone  Continue low dose clonazepam bid for now.

## 2014-09-01 ENCOUNTER — Ambulatory Visit: Payer: Self-pay | Admitting: General Surgery

## 2014-09-01 ENCOUNTER — Telehealth: Payer: Self-pay | Admitting: *Deleted

## 2014-09-01 DIAGNOSIS — Z79899 Other long term (current) drug therapy: Secondary | ICD-10-CM | POA: Diagnosis not present

## 2014-09-01 DIAGNOSIS — Z8249 Family history of ischemic heart disease and other diseases of the circulatory system: Secondary | ICD-10-CM | POA: Diagnosis not present

## 2014-09-01 DIAGNOSIS — K449 Diaphragmatic hernia without obstruction or gangrene: Secondary | ICD-10-CM | POA: Diagnosis not present

## 2014-09-01 DIAGNOSIS — E785 Hyperlipidemia, unspecified: Secondary | ICD-10-CM | POA: Diagnosis not present

## 2014-09-01 DIAGNOSIS — Z87891 Personal history of nicotine dependence: Secondary | ICD-10-CM | POA: Diagnosis not present

## 2014-09-01 DIAGNOSIS — I739 Peripheral vascular disease, unspecified: Secondary | ICD-10-CM | POA: Diagnosis not present

## 2014-09-01 DIAGNOSIS — G629 Polyneuropathy, unspecified: Secondary | ICD-10-CM | POA: Diagnosis not present

## 2014-09-01 DIAGNOSIS — R194 Change in bowel habit: Secondary | ICD-10-CM | POA: Diagnosis not present

## 2014-09-01 DIAGNOSIS — Z91040 Latex allergy status: Secondary | ICD-10-CM | POA: Diagnosis not present

## 2014-09-01 DIAGNOSIS — I2510 Atherosclerotic heart disease of native coronary artery without angina pectoris: Secondary | ICD-10-CM | POA: Diagnosis not present

## 2014-09-01 DIAGNOSIS — Z1211 Encounter for screening for malignant neoplasm of colon: Secondary | ICD-10-CM | POA: Diagnosis not present

## 2014-09-01 DIAGNOSIS — Z818 Family history of other mental and behavioral disorders: Secondary | ICD-10-CM | POA: Diagnosis not present

## 2014-09-01 DIAGNOSIS — Z888 Allergy status to other drugs, medicaments and biological substances status: Secondary | ICD-10-CM | POA: Diagnosis not present

## 2014-09-01 DIAGNOSIS — Z833 Family history of diabetes mellitus: Secondary | ICD-10-CM | POA: Diagnosis not present

## 2014-09-01 DIAGNOSIS — K589 Irritable bowel syndrome without diarrhea: Secondary | ICD-10-CM | POA: Diagnosis not present

## 2014-09-01 DIAGNOSIS — I714 Abdominal aortic aneurysm, without rupture: Secondary | ICD-10-CM | POA: Diagnosis not present

## 2014-09-01 DIAGNOSIS — Z951 Presence of aortocoronary bypass graft: Secondary | ICD-10-CM | POA: Diagnosis not present

## 2014-09-01 DIAGNOSIS — K219 Gastro-esophageal reflux disease without esophagitis: Secondary | ICD-10-CM | POA: Diagnosis not present

## 2014-09-01 DIAGNOSIS — Z882 Allergy status to sulfonamides status: Secondary | ICD-10-CM | POA: Diagnosis not present

## 2014-09-01 DIAGNOSIS — Z881 Allergy status to other antibiotic agents status: Secondary | ICD-10-CM | POA: Diagnosis not present

## 2014-09-01 DIAGNOSIS — Z9889 Other specified postprocedural states: Secondary | ICD-10-CM | POA: Diagnosis not present

## 2014-09-01 DIAGNOSIS — Z7982 Long term (current) use of aspirin: Secondary | ICD-10-CM | POA: Diagnosis not present

## 2014-09-01 DIAGNOSIS — Z79890 Hormone replacement therapy (postmenopausal): Secondary | ICD-10-CM | POA: Diagnosis not present

## 2014-09-01 DIAGNOSIS — K5730 Diverticulosis of large intestine without perforation or abscess without bleeding: Secondary | ICD-10-CM | POA: Diagnosis not present

## 2014-09-01 DIAGNOSIS — Z8049 Family history of malignant neoplasm of other genital organs: Secondary | ICD-10-CM | POA: Diagnosis not present

## 2014-09-01 DIAGNOSIS — Z803 Family history of malignant neoplasm of breast: Secondary | ICD-10-CM | POA: Diagnosis not present

## 2014-09-01 HISTORY — PX: COLONOSCOPY: SHX174

## 2014-09-01 NOTE — Telephone Encounter (Signed)
Spoke with pharmacist, advised of MDs message

## 2014-09-01 NOTE — Telephone Encounter (Signed)
Call from pharmacist requesting Remeron can be taken one tablet every other day instead of half tablet daily.  Pharmacist states that once the pill is broke open to half the remaining must be discarded.  Please advise

## 2014-09-01 NOTE — Telephone Encounter (Signed)
YES CAN TAE EVERY OTHER DAY

## 2014-09-03 ENCOUNTER — Other Ambulatory Visit: Payer: Self-pay | Admitting: Internal Medicine

## 2014-09-03 ENCOUNTER — Encounter: Payer: Self-pay | Admitting: General Surgery

## 2014-09-03 DIAGNOSIS — N644 Mastodynia: Principal | ICD-10-CM

## 2014-09-03 DIAGNOSIS — N6310 Unspecified lump in the right breast, unspecified quadrant: Secondary | ICD-10-CM

## 2014-09-06 ENCOUNTER — Ambulatory Visit: Payer: Self-pay | Admitting: Internal Medicine

## 2014-09-06 DIAGNOSIS — Z79899 Other long term (current) drug therapy: Secondary | ICD-10-CM | POA: Diagnosis not present

## 2014-09-06 DIAGNOSIS — M899 Disorder of bone, unspecified: Secondary | ICD-10-CM | POA: Diagnosis not present

## 2014-09-06 DIAGNOSIS — I2510 Atherosclerotic heart disease of native coronary artery without angina pectoris: Secondary | ICD-10-CM | POA: Diagnosis not present

## 2014-09-06 DIAGNOSIS — F419 Anxiety disorder, unspecified: Secondary | ICD-10-CM | POA: Diagnosis not present

## 2014-09-06 DIAGNOSIS — R799 Abnormal finding of blood chemistry, unspecified: Secondary | ICD-10-CM | POA: Diagnosis not present

## 2014-09-06 DIAGNOSIS — K219 Gastro-esophageal reflux disease without esophagitis: Secondary | ICD-10-CM | POA: Diagnosis not present

## 2014-09-07 ENCOUNTER — Encounter: Payer: Self-pay | Admitting: Internal Medicine

## 2014-09-08 DIAGNOSIS — N644 Mastodynia: Secondary | ICD-10-CM | POA: Diagnosis not present

## 2014-09-08 DIAGNOSIS — R921 Mammographic calcification found on diagnostic imaging of breast: Secondary | ICD-10-CM | POA: Diagnosis not present

## 2014-09-08 DIAGNOSIS — M79629 Pain in unspecified upper arm: Secondary | ICD-10-CM | POA: Diagnosis not present

## 2014-09-08 DIAGNOSIS — N63 Unspecified lump in breast: Secondary | ICD-10-CM | POA: Diagnosis not present

## 2014-09-08 LAB — HM MAMMOGRAPHY: HM MAMMO: NORMAL

## 2014-09-09 LAB — MAGNESIUM: Magnesium: 2.2 mg/dL

## 2014-09-09 LAB — POTASSIUM: POTASSIUM: 4.6 mmol/L (ref 3.5–5.1)

## 2014-09-09 LAB — CREATININE, SERUM
Creatinine: 1.17 mg/dL (ref 0.60–1.30)
EGFR (African American): 59 — ABNORMAL LOW
GFR CALC NON AF AMER: 48 — AB

## 2014-09-15 ENCOUNTER — Ambulatory Visit: Payer: Medicare Other | Admitting: General Surgery

## 2014-09-15 ENCOUNTER — Encounter: Payer: Self-pay | Admitting: General Surgery

## 2014-09-15 ENCOUNTER — Ambulatory Visit: Payer: Self-pay | Admitting: General Surgery

## 2014-09-15 ENCOUNTER — Ambulatory Visit (INDEPENDENT_AMBULATORY_CARE_PROVIDER_SITE_OTHER): Payer: Self-pay | Admitting: General Surgery

## 2014-09-15 VITALS — BP 128/78 | HR 74 | Resp 12 | Ht 65.0 in | Wt 156.0 lb

## 2014-09-15 DIAGNOSIS — R194 Change in bowel habit: Secondary | ICD-10-CM

## 2014-09-15 NOTE — Patient Instructions (Addendum)
The patient will be encouraged to make use of a daily fiber supplement. Patient to return in 10 years screening colonoscopy.

## 2014-09-15 NOTE — Progress Notes (Signed)
Patient ID: Sabrina Montoya, female   DOB: 07/04/1941, 73 y.o.   MRN: [id number]  Chief Complaint  Patient presents with  . Routine Post Op    colonoscopy    HPI Sabrina Montoya is a 73 y.o. female here today for her post op colonoscopy done on 09/01/14. Patient states she is doing well. No pain with bowel movements. HPI  Past Medical History  Diagnosis Date  . Diverticulitis of colon   . Rheumatic fever     possible at age 53  . Tobacco abuse   . Hyperlipidemia   . Abdominal aortic aneurysm without mention of rupture     infrarenal, stable, folllowed by Leotis Pain  . CAD (coronary artery disease), autologous vein bypass graft   . Peripheral vascular disease     s/p CEA   . Tobacco abuse   . B12 deficiency   . Acute posthemorrhagic anemia   . Cardiac dysrhythmia, unspecified   . Neuropathy   . Reflux esophagitis   . Sliding hiatal hernia   . GERD (gastroesophageal reflux disease)   . Colon polyp   . IBS (irritable bowel syndrome)     Past Surgical History  Procedure Laterality Date  . Closed manipulation shoulder      left shoulder post physical therapy, Right shoulder redo (toggle bolts)  . Rotator cuff repair  2002    right shoulder Dr. Nadean Corwin, [place] in [place]      Carotid stenosis found during workup for dysphagia,  [facility]  . Lumbar disc surgery  1971    L5, unssuccessful, fusion in 1983 successful (2 lumbar)  . Abdominal surgery  1976    for pain secondary to scar tissue, s/p apply  . 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 Hernia repair  may 2011    Dr. Pat Patrick  . Coronary artery bypass graft  2006    s/p triple bypass surgery, [facility]  . Cholecystectomy  2002    Dr. Pat Patrick  . Coronary angiography  01/2011    one occluded artery with collateralization, other arteries patent  . Hernia repair    . Appendectomy  1974  . Colonoscopy  09/01/14    Family History  Problem Relation Age of Onset   . Cancer Sister     cervical cancer  . Diabetes Sister   . Heart disease Brother     coronary artery disease  . Other Brother     suicide  . Diabetes Mother   . Heart disease Mother   . Diabetes Sister   . Other Father     suicide  . Cancer Sister     breast  . Other Other     colon resection due to inflammation -nephew    Social History History  Substance Use Topics  . Smoking status: Former Smoker -- 0.50 packs/day for 30 years    Types: Cigarettes    Quit date: 04/01/2012  . Smokeless tobacco: Never Used     Comment: quit for 2 years after sinus infection and 6 months after heart surgery  . Alcohol Use: No    Allergies  Allergen Reactions  . Niacin And Related Hives  . Sulfa Drugs Cross Reactors Nausea Only  . Tetracyclines & Related Hives  . Latex Rash  . Simvastatin Rash    *Antihyperlipidemics*; elevated LFT's.     Current Outpatient Prescriptions  Medication Sig Dispense Refill  .  aspirin 81 MG tablet Take 81 mg by mouth daily.    Marland Kitchen atorvastatin (LIPITOR) 80 MG tablet Take 1 tablet (80 mg total) by mouth daily. 90 tablet 3  . busPIRone (BUSPAR) 7.5 MG tablet TAKE 1 TABLET BY MOUTH TWO TO THREE TIMES A DAY 90 tablet 5  . cholecalciferol (VITAMIN D) 1000 UNITS tablet Take 1,000 Units by mouth daily.    . clonazePAM (KLONOPIN) 0.5 MG tablet TAKE 1 TABLET BY MOUTH TWICE DAILY AS NEEDED. 60 tablet 3  . conjugated estrogens (PREMARIN) vaginal cream Place 1 Applicatorful vaginally at bedtime. 42.5 g 12  . dicyclomine (BENTYL) 20 MG tablet Take 20 mg by mouth 2 (two) times daily.    . famotidine (PEPCID) 20 MG tablet TAKE 1 TABLET BY MOUTH TWICE DAILY BEFORE BREAKFAST AND DINNER 60 tablet 2  . magnesium oxide (MAG-OX) 400 (241.3 MG) MG tablet TAKE ONE TABLET BY MOUTH TWICE DAILY 60 tablet 2  . metoprolol tartrate (LOPRESSOR) 25 MG tablet TAKE 1/2 TABLET BY MOUTH TWICE DAILY 180 tablet 1  . mirtazapine (REMERON SOL-TAB) 15 MG disintegrating tablet Take 0.5 tablets  (7.5 mg total) by mouth at bedtime. For two weeks, then increase to 1 tablet weekly 30 tablet 1  . nitroGLYCERIN (NITROSTAT) 0.4 MG SL tablet Place 0.4 mg under the tongue every 5 (five) minutes as needed. As needed for chest pain     . polyethylene glycol powder (GLYCOLAX/MIRALAX) powder 255 grams one bottle for colonoscopy prep 255 g 0   No current facility-administered medications for this visit.    Review of Systems Review of Systems  Constitutional: Negative.   Respiratory: Negative.   Cardiovascular: Negative.   Gastrointestinal: Negative.     Blood pressure 128/78, pulse 74, resp. rate 12, height 5\' 5"  (1.651 m), weight 156 lb (70.761 kg).  Physical Exam Physical Exam  Data Reviewed Colonoscopy showed only evidence of diverticulosis without evidence of diverticulitis. No mass effect identified in the rectum on either antegrade or retrograde viewing.  Assessment    Change in stool habits, no evidence of primary colonic pathology.     Plan    The patient is undergoing evaluation by a-year-old gynecologist for management of stress incontinence. The patient reports that the  Physician suspects that the uterus is compressing the rectum and causing some of the changes in the stool caliber.  The patient reports that for the week after the colonoscopy or bowel movements were normal but have now drifted back to somewhat ribbon-like stools. No blood or mucus.  No primary pathology notified in the colon, possibility of external compression is present.  The patient will be encouraged to make use of a daily fiber supplement.  Patient to return in 10 years screening colonoscopy.      PCP:  Mattie [name] 09/15/2014, 8:40 PM

## 2014-09-17 ENCOUNTER — Telehealth: Payer: Self-pay | Admitting: *Deleted

## 2014-09-17 NOTE — Telephone Encounter (Signed)
Pt left VM requesting hip xray results. Called pt, notified of unread mychart message from 08/29/14 with results. Pt would like to think about options and will call office back with decision.

## 2014-09-21 DIAGNOSIS — N390 Urinary tract infection, site not specified: Secondary | ICD-10-CM | POA: Diagnosis not present

## 2014-09-21 DIAGNOSIS — N183 Chronic kidney disease, stage 3 (moderate): Secondary | ICD-10-CM | POA: Diagnosis not present

## 2014-09-21 DIAGNOSIS — D631 Anemia in chronic kidney disease: Secondary | ICD-10-CM | POA: Diagnosis not present

## 2014-09-21 DIAGNOSIS — I10 Essential (primary) hypertension: Secondary | ICD-10-CM | POA: Diagnosis not present

## 2014-09-21 DIAGNOSIS — N2581 Secondary hyperparathyroidism of renal origin: Secondary | ICD-10-CM | POA: Diagnosis not present

## 2014-09-23 DIAGNOSIS — Z79899 Other long term (current) drug therapy: Secondary | ICD-10-CM | POA: Diagnosis not present

## 2014-09-23 DIAGNOSIS — R799 Abnormal finding of blood chemistry, unspecified: Secondary | ICD-10-CM | POA: Diagnosis not present

## 2014-09-23 LAB — CREATININE, SERUM
CREATININE: 1.4 mg/dL — AB (ref 0.60–1.30)
EGFR (African American): 48 — ABNORMAL LOW
EGFR (Non-African Amer.): 39 — ABNORMAL LOW

## 2014-09-23 LAB — POTASSIUM: Potassium: 4.9 mmol/L (ref 3.5–5.1)

## 2014-09-23 LAB — CANCER CENTER HEMATOCRIT: HCT: 34.5 % — AB (ref 35.0–47.0)

## 2014-09-23 LAB — CANCER CENTER HEMOGLOBIN: HGB: 11.4 g/dL — ABNORMAL LOW (ref 12.0–16.0)

## 2014-09-23 LAB — MAGNESIUM: MAGNESIUM: 2.2 mg/dL

## 2014-09-24 ENCOUNTER — Encounter: Payer: Self-pay | Admitting: Internal Medicine

## 2014-09-25 DIAGNOSIS — R152 Fecal urgency: Secondary | ICD-10-CM | POA: Diagnosis not present

## 2014-09-25 DIAGNOSIS — N952 Postmenopausal atrophic vaginitis: Secondary | ICD-10-CM | POA: Diagnosis not present

## 2014-09-25 DIAGNOSIS — N813 Complete uterovaginal prolapse: Secondary | ICD-10-CM | POA: Diagnosis not present

## 2014-09-25 DIAGNOSIS — N3000 Acute cystitis without hematuria: Secondary | ICD-10-CM | POA: Diagnosis not present

## 2014-09-28 ENCOUNTER — Encounter: Payer: Self-pay | Admitting: Internal Medicine

## 2014-09-29 ENCOUNTER — Other Ambulatory Visit: Payer: Self-pay | Admitting: Internal Medicine

## 2014-10-06 ENCOUNTER — Ambulatory Visit: Payer: Self-pay | Admitting: Internal Medicine

## 2014-10-06 DIAGNOSIS — K219 Gastro-esophageal reflux disease without esophagitis: Secondary | ICD-10-CM | POA: Diagnosis not present

## 2014-10-06 DIAGNOSIS — E785 Hyperlipidemia, unspecified: Secondary | ICD-10-CM | POA: Diagnosis not present

## 2014-10-06 DIAGNOSIS — M8580 Other specified disorders of bone density and structure, unspecified site: Secondary | ICD-10-CM | POA: Diagnosis not present

## 2014-10-06 DIAGNOSIS — R7989 Other specified abnormal findings of blood chemistry: Secondary | ICD-10-CM | POA: Diagnosis not present

## 2014-10-06 DIAGNOSIS — I2510 Atherosclerotic heart disease of native coronary artery without angina pectoris: Secondary | ICD-10-CM | POA: Diagnosis not present

## 2014-10-06 DIAGNOSIS — Z79899 Other long term (current) drug therapy: Secondary | ICD-10-CM | POA: Diagnosis not present

## 2014-10-06 DIAGNOSIS — E559 Vitamin D deficiency, unspecified: Secondary | ICD-10-CM | POA: Diagnosis not present

## 2014-10-08 DIAGNOSIS — N183 Chronic kidney disease, stage 3 (moderate): Secondary | ICD-10-CM | POA: Diagnosis not present

## 2014-10-13 ENCOUNTER — Telehealth: Payer: Self-pay | Admitting: Internal Medicine

## 2014-10-13 NOTE — Telephone Encounter (Signed)
Patient called with right hip pain and stated that she never heard from X-ray from 10/15 advised patient that was available through my chart and that becky had also called patient with results and with starting diclofenac for pain along [place]. Patient stated she thought this medication was the one that caused her to feel depressed all the time. Patient also stated she had CT and Dr. Juleen China advised she has a lesion on kidney but is non malignant and is not causing her pain. Patient stated her abdomen is swollen with this pain. Advised patient since MD is Gone she should go to ER if pain persists or becomes worse. Patient ask that I notify MD. Called 4.43 PM.

## 2014-10-14 ENCOUNTER — Telehealth: Payer: Self-pay | Admitting: Internal Medicine

## 2014-10-14 DIAGNOSIS — R7989 Other specified abnormal findings of blood chemistry: Secondary | ICD-10-CM | POA: Diagnosis not present

## 2014-10-14 LAB — CBC CANCER CENTER
Basophil #: 0 x10 3/mm (ref 0.0–0.1)
Basophil %: 0.5 %
Eosinophil #: 0.2 x10 3/mm (ref 0.0–0.7)
Eosinophil %: 5 %
HCT: 32.4 % — AB (ref 35.0–47.0)
HGB: 10.7 g/dL — ABNORMAL LOW (ref 12.0–16.0)
Lymphocyte #: 1.6 x10 3/mm (ref 1.0–3.6)
Lymphocyte %: 36.1 %
MCH: 28.3 pg (ref 26.0–34.0)
MCHC: 33 g/dL (ref 32.0–36.0)
MCV: 86 fL (ref 80–100)
MONO ABS: 0.5 x10 3/mm (ref 0.2–0.9)
Monocyte %: 10.4 %
NEUTROS ABS: 2.1 x10 3/mm (ref 1.4–6.5)
Neutrophil %: 48 %
PLATELETS: 200 x10 3/mm (ref 150–440)
RBC: 3.79 10*6/uL — ABNORMAL LOW (ref 3.80–5.20)
RDW: 14.4 % (ref 11.5–14.5)
WBC: 4.4 x10 3/mm (ref 3.6–11.0)

## 2014-10-14 LAB — MAGNESIUM: MAGNESIUM: 2.2 mg/dL

## 2014-10-14 LAB — POTASSIUM: Potassium: 4.2 mmol/L (ref 3.5–5.1)

## 2014-10-14 LAB — CREATININE, SERUM
CREATININE: 1.31 mg/dL — AB (ref 0.60–1.30)
GFR CALC AF AMER: 51 — AB
GFR CALC NON AF AMER: 42 — AB

## 2014-10-14 NOTE — Telephone Encounter (Signed)
Due to on appointment available until 11/11/14 patient requested referral to Dr. Jacqualyn Posey.

## 2014-10-14 NOTE — Telephone Encounter (Signed)
WHO IS DR Jacqualyn Posey?

## 2014-10-14 NOTE — Telephone Encounter (Signed)
Patient notified and advised patient she would need an appointment or if pain became severe to go to the ER patient stated she will call back.

## 2014-10-14 NOTE — Telephone Encounter (Signed)
Pt request for a referral to Dr. Leigh Aurora office.msn

## 2014-10-14 NOTE — Telephone Encounter (Signed)
THERE is nothing i can do by phone,  If her abdominal pain is severe she needs to go to Er   If not,  She'll need to make appt.

## 2014-10-14 NOTE — Telephone Encounter (Signed)
Dr. Inez Pilgrim called to make you aware he saw patient today in office but did not see anything that concerned him that what patient had was acute or needed to be addressed immediately. Patient complained that pain ran down to knee from abdomen but Dr. Inez Pilgrim so no cause to believe it was a blood clot or anything acute. He will send notes and copy of labs,

## 2014-10-14 NOTE — Telephone Encounter (Signed)
She is with GYN at [facility]. Patient stated she has been referred to her before. [phone] DR. Wagoner Please advise. Told patient that she may be able to just call and schedule an appointment if she has been there previously. Patient rather have referral.

## 2014-10-15 ENCOUNTER — Encounter: Payer: Self-pay | Admitting: Internal Medicine

## 2014-10-15 LAB — KAPPA/LAMBDA FREE LIGHT CHAINS (ARMC)

## 2014-11-06 ENCOUNTER — Ambulatory Visit: Payer: Self-pay | Admitting: Internal Medicine

## 2014-11-06 DIAGNOSIS — R799 Abnormal finding of blood chemistry, unspecified: Secondary | ICD-10-CM | POA: Diagnosis not present

## 2014-11-06 HISTORY — PX: OTHER SURGICAL HISTORY: SHX169

## 2014-11-09 DIAGNOSIS — N813 Complete uterovaginal prolapse: Secondary | ICD-10-CM | POA: Diagnosis not present

## 2014-11-10 DIAGNOSIS — R159 Full incontinence of feces: Secondary | ICD-10-CM | POA: Diagnosis not present

## 2014-11-10 DIAGNOSIS — N8111 Cystocele, midline: Secondary | ICD-10-CM | POA: Diagnosis not present

## 2014-11-10 DIAGNOSIS — N814 Uterovaginal prolapse, unspecified: Secondary | ICD-10-CM | POA: Diagnosis not present

## 2014-11-13 ENCOUNTER — Other Ambulatory Visit: Payer: Self-pay | Admitting: Internal Medicine

## 2014-11-13 ENCOUNTER — Telehealth: Payer: Self-pay | Admitting: Internal Medicine

## 2014-11-13 DIAGNOSIS — E559 Vitamin D deficiency, unspecified: Secondary | ICD-10-CM

## 2014-11-13 DIAGNOSIS — E785 Hyperlipidemia, unspecified: Secondary | ICD-10-CM

## 2014-11-13 DIAGNOSIS — R5383 Other fatigue: Secondary | ICD-10-CM

## 2014-11-13 MED ORDER — ROSUVASTATIN CALCIUM 40 MG PO TABS: 40.0000 mg | ORAL_TABLET | Freq: Every day | ORAL | Status: DC

## 2014-11-13 NOTE — Telephone Encounter (Signed)
I would recommend waiting and checking labs first prior to change in medication.

## 2014-11-13 NOTE — Telephone Encounter (Signed)
Patient called asking to change back to Crestor, patient has not had Lipid since 11/14 scheduled patient appointment for fasting labs for 11/19/13 Please advise if OK? Patient has a future lipid pending in Epic.

## 2014-11-16 NOTE — Telephone Encounter (Signed)
Patient advised as directed. °

## 2014-11-17 DIAGNOSIS — R799 Abnormal finding of blood chemistry, unspecified: Secondary | ICD-10-CM | POA: Diagnosis not present

## 2014-11-17 LAB — CANCER CENTER HEMOGLOBIN: HGB: 11.1 g/dL — ABNORMAL LOW (ref 12.0–16.0)

## 2014-11-17 LAB — MAGNESIUM: MAGNESIUM: 2.2 mg/dL

## 2014-11-19 ENCOUNTER — Encounter: Payer: Self-pay | Admitting: Internal Medicine

## 2014-11-19 ENCOUNTER — Other Ambulatory Visit (INDEPENDENT_AMBULATORY_CARE_PROVIDER_SITE_OTHER): Payer: Medicare Other

## 2014-11-19 DIAGNOSIS — E785 Hyperlipidemia, unspecified: Secondary | ICD-10-CM | POA: Diagnosis not present

## 2014-11-19 DIAGNOSIS — E559 Vitamin D deficiency, unspecified: Secondary | ICD-10-CM | POA: Diagnosis not present

## 2014-11-19 DIAGNOSIS — Z79899 Other long term (current) drug therapy: Secondary | ICD-10-CM

## 2014-11-19 DIAGNOSIS — R5383 Other fatigue: Secondary | ICD-10-CM | POA: Diagnosis not present

## 2014-11-19 LAB — COMPREHENSIVE METABOLIC PANEL
ALK PHOS: 78 U/L (ref 39–117)
ALT: 18 U/L (ref 0–35)
AST: 30 U/L (ref 0–37)
Albumin: 4.4 g/dL (ref 3.5–5.2)
BILIRUBIN TOTAL: 0.3 mg/dL (ref 0.2–1.2)
BUN: 28 mg/dL — AB (ref 6–23)
CALCIUM: 9.5 mg/dL (ref 8.4–10.5)
CO2: 27 mEq/L (ref 19–32)
CREATININE: 1.17 mg/dL (ref 0.40–1.20)
Chloride: 107 mEq/L (ref 96–112)
GFR: 48.26 mL/min — ABNORMAL LOW (ref >60.00)
Glucose, Bld: 138 mg/dL — ABNORMAL HIGH (ref 70–99)
Potassium: 3.9 mEq/L (ref 3.5–5.1)
SODIUM: 140 meq/L (ref 135–145)
Total Protein: 7.5 g/dL (ref 6.0–8.3)

## 2014-11-19 LAB — LIPID PANEL
CHOL/HDL RATIO: 4
Cholesterol: 125 mg/dL (ref 0–200)
HDL: 33.2 mg/dL — ABNORMAL LOW (ref >39.00)
LDL CALC: 69 mg/dL (ref 0–99)
NONHDL: 91.8
TRIGLYCERIDES: 116 mg/dL (ref 0.0–149.0)
VLDL: 23.2 mg/dL (ref 0.0–40.0)

## 2014-11-19 LAB — HEPATIC FUNCTION PANEL
ALT: 18 U/L (ref 0–35)
AST: 30 U/L (ref 0–37)
Albumin: 4.4 g/dL (ref 3.5–5.2)
Alkaline Phosphatase: 78 U/L (ref 39–117)
BILIRUBIN DIRECT: 0.1 mg/dL (ref 0.0–0.3)
Total Bilirubin: 0.3 mg/dL (ref 0.2–1.2)
Total Protein: 7.5 g/dL (ref 6.0–8.3)

## 2014-11-19 LAB — TSH: TSH: 1.61 u[IU]/mL (ref 0.35–4.50)

## 2014-11-19 LAB — VITAMIN D 25 HYDROXY (VIT D DEFICIENCY, FRACTURES): VITD: 43.26 ng/mL (ref 30.00–100.00)

## 2014-11-30 ENCOUNTER — Other Ambulatory Visit: Payer: Self-pay | Admitting: Internal Medicine

## 2014-12-07 ENCOUNTER — Ambulatory Visit: Payer: Self-pay | Admitting: Internal Medicine

## 2014-12-07 DIAGNOSIS — R799 Abnormal finding of blood chemistry, unspecified: Secondary | ICD-10-CM | POA: Diagnosis not present

## 2014-12-08 DIAGNOSIS — I6529 Occlusion and stenosis of unspecified carotid artery: Secondary | ICD-10-CM | POA: Diagnosis not present

## 2014-12-08 DIAGNOSIS — K219 Gastro-esophageal reflux disease without esophagitis: Secondary | ICD-10-CM | POA: Diagnosis not present

## 2014-12-08 DIAGNOSIS — I10 Essential (primary) hypertension: Secondary | ICD-10-CM | POA: Diagnosis not present

## 2014-12-08 DIAGNOSIS — N8110 Cystocele, unspecified: Secondary | ICD-10-CM | POA: Diagnosis not present

## 2014-12-08 DIAGNOSIS — N390 Urinary tract infection, site not specified: Secondary | ICD-10-CM | POA: Diagnosis not present

## 2014-12-08 DIAGNOSIS — R002 Palpitations: Secondary | ICD-10-CM | POA: Diagnosis not present

## 2014-12-08 DIAGNOSIS — Z01818 Encounter for other preprocedural examination: Secondary | ICD-10-CM | POA: Diagnosis not present

## 2014-12-08 DIAGNOSIS — I2510 Atherosclerotic heart disease of native coronary artery without angina pectoris: Secondary | ICD-10-CM | POA: Diagnosis not present

## 2014-12-09 DIAGNOSIS — R799 Abnormal finding of blood chemistry, unspecified: Secondary | ICD-10-CM | POA: Diagnosis not present

## 2014-12-09 LAB — MAGNESIUM: MAGNESIUM: 2 mg/dL

## 2014-12-09 LAB — CBC CANCER CENTER
Basophil #: 0 x10 3/mm (ref 0.0–0.1)
Basophil %: 0.5 %
EOS ABS: 0.2 x10 3/mm (ref 0.0–0.7)
Eosinophil %: 4.8 %
HCT: 32 % — ABNORMAL LOW (ref 35.0–47.0)
HGB: 10.8 g/dL — ABNORMAL LOW (ref 12.0–16.0)
LYMPHS PCT: 34.4 %
Lymphocyte #: 1.3 x10 3/mm (ref 1.0–3.6)
MCH: 28.3 pg (ref 26.0–34.0)
MCHC: 33.7 g/dL (ref 32.0–36.0)
MCV: 84 fL (ref 80–100)
Monocyte #: 0.3 x10 3/mm (ref 0.2–0.9)
Monocyte %: 8.2 %
NEUTROS PCT: 52.1 %
Neutrophil #: 2 x10 3/mm (ref 1.4–6.5)
Platelet: 186 x10 3/mm (ref 150–440)
RBC: 3.8 10*6/uL (ref 3.80–5.20)
RDW: 13.7 % (ref 11.5–14.5)
WBC: 3.8 x10 3/mm (ref 3.6–11.0)

## 2014-12-09 LAB — POTASSIUM: POTASSIUM: 4 mmol/L (ref 3.5–5.1)

## 2014-12-09 LAB — CREATININE, SERUM
CREATININE: 1.16 mg/dL (ref 0.60–1.30)
EGFR (Non-African Amer.): 49 — ABNORMAL LOW
GFR CALC AF AMER: 59 — AB

## 2014-12-11 LAB — KAPPA/LAMBDA FREE LIGHT CHAINS (ARMC)

## 2014-12-15 DIAGNOSIS — N8111 Cystocele, midline: Secondary | ICD-10-CM | POA: Diagnosis not present

## 2014-12-15 DIAGNOSIS — N813 Complete uterovaginal prolapse: Secondary | ICD-10-CM | POA: Diagnosis not present

## 2014-12-15 DIAGNOSIS — R159 Full incontinence of feces: Secondary | ICD-10-CM | POA: Diagnosis not present

## 2014-12-15 DIAGNOSIS — N814 Uterovaginal prolapse, unspecified: Secondary | ICD-10-CM | POA: Diagnosis not present

## 2014-12-16 DIAGNOSIS — N813 Complete uterovaginal prolapse: Secondary | ICD-10-CM | POA: Diagnosis not present

## 2014-12-16 DIAGNOSIS — N8111 Cystocele, midline: Secondary | ICD-10-CM | POA: Diagnosis not present

## 2015-01-05 ENCOUNTER — Encounter: Payer: Self-pay | Admitting: Nurse Practitioner

## 2015-01-05 ENCOUNTER — Ambulatory Visit (INDEPENDENT_AMBULATORY_CARE_PROVIDER_SITE_OTHER): Payer: Medicare Other | Admitting: Nurse Practitioner

## 2015-01-05 ENCOUNTER — Encounter: Payer: Self-pay | Admitting: Internal Medicine

## 2015-01-05 ENCOUNTER — Ambulatory Visit
Admit: 2015-01-05 | Disposition: A | Discharge status: Home / Self Care | Payer: Self-pay | Attending: Internal Medicine | Admitting: Internal Medicine

## 2015-01-05 ENCOUNTER — Ambulatory Visit (INDEPENDENT_AMBULATORY_CARE_PROVIDER_SITE_OTHER)
Admission: RE | Admit: 2015-01-05 | Discharge: 2015-01-05 | Disposition: A | Discharge status: Home / Self Care | Payer: Medicare Other | Source: Ambulatory Visit | Attending: Nurse Practitioner | Admitting: Nurse Practitioner

## 2015-01-05 VITALS — BP 124/70 | HR 75 | Temp 98.0°F | Resp 12 | Ht 65.0 in | Wt 157.1 lb

## 2015-01-05 DIAGNOSIS — K219 Gastro-esophageal reflux disease without esophagitis: Secondary | ICD-10-CM | POA: Diagnosis not present

## 2015-01-05 DIAGNOSIS — R799 Abnormal finding of blood chemistry, unspecified: Secondary | ICD-10-CM | POA: Diagnosis not present

## 2015-01-05 DIAGNOSIS — R7989 Other specified abnormal findings of blood chemistry: Secondary | ICD-10-CM | POA: Diagnosis not present

## 2015-01-05 DIAGNOSIS — I2510 Atherosclerotic heart disease of native coronary artery without angina pectoris: Secondary | ICD-10-CM | POA: Diagnosis not present

## 2015-01-05 DIAGNOSIS — M542 Cervicalgia: Secondary | ICD-10-CM | POA: Diagnosis not present

## 2015-01-05 DIAGNOSIS — E785 Hyperlipidemia, unspecified: Secondary | ICD-10-CM | POA: Diagnosis not present

## 2015-01-05 DIAGNOSIS — Z79899 Other long term (current) drug therapy: Secondary | ICD-10-CM | POA: Diagnosis not present

## 2015-01-05 DIAGNOSIS — M4322 Fusion of spine, cervical region: Secondary | ICD-10-CM | POA: Diagnosis not present

## 2015-01-05 MED ORDER — GABAPENTIN 300 MG PO CAPS: 300.0000 mg | ORAL_CAPSULE | Freq: Three times a day (TID) | ORAL | Status: DC

## 2015-01-05 NOTE — Progress Notes (Signed)
Subjective:    Patient ID: Sabrina Montoya, female    DOB: 09-08-1941, 74 y.o.   MRN: [id number]  HPI  Sabrina Montoya is a 74 yo female with a CC of sharp pains extending from right ear to center of head onset 5 days.   New or old- New  When did they start- Started Friday afternoon  Trauma? Shunt? History of cancer- No  Ever had one like this before- Had them previously in 2011 Currently being seen by a neurologist or at a headache clinic- No  Sudden or gradual- Sudden  Location: Behind right ear and left back of head  Duration: Constant yesterday  Radiation- to top of head 07 carotid surgery   Alkalol- rinsing   Ibuprofen- stopped over the weekend  ASA 81 mg daily  Recent surgery 3 weeks ago hysterectomy      Review of Systems  Constitutional: Negative for fever, chills, diaphoresis and fatigue.  Eyes: Positive for photophobia and visual disturbance.       Right eye   Respiratory: Negative for chest tightness, shortness of breath and wheezing.   Cardiovascular: Negative for chest pain, palpitations and leg swelling.  Gastrointestinal: Negative for nausea, vomiting, diarrhea and constipation.  Musculoskeletal: Positive for neck pain. Negative for neck stiffness.  Skin: Negative for rash.  Neurological: Positive for headaches. Negative for dizziness, weakness and numbness.  Psychiatric/Behavioral: The patient is not nervous/anxious.    Past Medical History  Diagnosis Date  . Diverticulitis of colon   . Rheumatic fever     possible at age 34  . Tobacco abuse   . Hyperlipidemia   . Abdominal aortic aneurysm without mention of rupture     infrarenal, stable, folllowed by Leotis Pain  . CAD (coronary artery disease), autologous vein bypass graft   . Peripheral vascular disease     s/p CEA   . Tobacco abuse   . B12 deficiency   . Acute posthemorrhagic anemia   . Cardiac dysrhythmia, unspecified   . Neuropathy   . Reflux esophagitis   . Sliding hiatal hernia   . GERD  (gastroesophageal reflux disease)   . Colon polyp   . IBS (irritable bowel syndrome)     History   Social History  . Marital Status: Widowed    Spouse Name: N/A  . Number of Children: N/A  . Years of Education: N/A   Occupational History  . Not on file.   Social History Main Topics  . Smoking status: Former Smoker -- 0.50 packs/day for 30 years    Types: Cigarettes    Quit date: 04/01/2012  . Smokeless tobacco: Never Used     Comment: quit for 2 years after sinus infection and 6 months after heart surgery  . Alcohol Use: No  . Drug Use: No  . Sexual Activity: Not on file   Other Topics Concern  . Not on file   Social History Narrative   Lives with spouse [name]    Past Surgical History  Procedure Laterality Date  . Closed manipulation shoulder      left shoulder post physical therapy, Right shoulder redo (toggle bolts)  . Rotator cuff repair  2002    right shoulder Dr. Nadean Corwin, [place] in [place]      Carotid stenosis found during workup for dysphagia,  [facility]  . Lumbar disc surgery  1971    L5, unssuccessful, fusion in 1983 successful (2 lumbar)  . Abdominal surgery  1976  for pain secondary to scar tissue, s/p apply  . 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 Hernia repair  may 2011    Dr. Pat Patrick  . Coronary artery bypass graft  2006    s/p triple bypass surgery, [facility]  . Cholecystectomy  2002    Dr. Pat Patrick  . Coronary angiography  01/2011    one occluded artery with collateralization, other arteries patent  . Hernia repair    . Appendectomy  1974  . Colonoscopy  09/01/14    Family History  Problem Relation Age of Onset  . Cancer Sister     cervical cancer  . Diabetes Sister   . Heart disease Brother     coronary artery disease  . Other Brother     suicide  . Diabetes Mother   . Heart disease Mother   . Diabetes Sister   . Other Father     suicide  . Cancer Sister       breast  . Other Other     colon resection due to inflammation -nephew    Allergies  Allergen Reactions  . Niacin And Related Hives  . Sulfa Drugs Cross Reactors Nausea Only  . Tetracyclines & Related Hives  . Latex Rash  . Simvastatin Rash    *Antihyperlipidemics*; elevated LFT's.     Current Outpatient Prescriptions on File Prior to Visit  Medication Sig Dispense Refill  . aspirin 81 MG tablet Take 81 mg by mouth daily.    . busPIRone (BUSPAR) 7.5 MG tablet TAKE 1 TABLET BY MOUTH TWO TO THREE TIMES A DAY 90 tablet 5  . cholecalciferol (VITAMIN D) 1000 UNITS tablet Take 1,000 Units by mouth daily.    . clonazePAM (KLONOPIN) 0.5 MG tablet TAKE 1 TABLET BY MOUTH TWICE DAILY AS NEEDED. 60 tablet 3  . conjugated estrogens (PREMARIN) vaginal cream Place 1 Applicatorful vaginally at bedtime. 42.5 g 12  . dicyclomine (BENTYL) 20 MG tablet Take 20 mg by mouth 2 (two) times daily.    . famotidine (PEPCID) 20 MG tablet TAKE 1 TABLET BY MOUTH TWICE DAILY BEFORE BREAKFAST AND DINNER 60 tablet 5  . magnesium oxide (MAG-OX) 400 (241.3 MG) MG tablet TAKE ONE TABLET BY MOUTH TWICE DAILY 60 tablet 5  . metoprolol tartrate (LOPRESSOR) 25 MG tablet TAKE 1/2 TABLET BY MOUTH TWICE DAILY 180 tablet 1  . mirtazapine (REMERON SOL-TAB) 15 MG disintegrating tablet Take 0.5 tablets (7.5 mg total) by mouth at bedtime. For two weeks, then increase to 1 tablet weekly 30 tablet 1  . nitroGLYCERIN (NITROSTAT) 0.4 MG SL tablet Place 0.4 mg under the tongue every 5 (five) minutes as needed. As needed for chest pain     . polyethylene glycol powder (GLYCOLAX/MIRALAX) powder 255 grams one bottle for colonoscopy prep 255 g 0  . rosuvastatin (CRESTOR) 40 MG tablet Take 1 tablet (40 mg total) by mouth daily. 90 tablet 1   No current facility-administered medications on file prior to visit.      Objective:   Physical Exam  Constitutional: She is oriented to person, place, and time. She appears well-developed and  well-nourished. No distress.  BP 124/70 mmHg  Pulse 75  Temp(Src) 98 F (36.7 C)  Resp 12  Ht 5\' 5"  (1.651 m)  Wt 157 lb 1.9 oz (71.269 kg)  BMI 26.15 kg/m2  SpO2 95%   HENT:  Head: Normocephalic and atraumatic.  Right Ear: External ear  normal.  Left Ear: External ear normal.  Eyes: Right eye exhibits no discharge. Left eye exhibits no discharge. No scleral icterus.  Neck: Normal range of motion. Neck supple. No thyromegaly present.  Painful paraspinal muscles, Full ROM, she states there is popping with lateral head movement.  Cardiovascular: Normal rate, regular rhythm, normal heart sounds and intact distal pulses.  Exam reveals no gallop and no friction rub.   No murmur heard. Pulmonary/Chest: Effort normal and breath sounds normal. No respiratory distress. She has no wheezes. She has no rales. She exhibits no tenderness.  Lymphadenopathy:    She has no cervical adenopathy.  Neurological: She is alert and oriented to person, place, and time. No cranial nerve deficit. She exhibits normal muscle tone. Coordination normal.  Tender to palpation- cervical spine and paraspinal muscles, UE and LE strength 5/5, sensation intact bilaterally UE and LE, negative rhomberg, intact rapid alternating movements, Pt does jump when it "shoots" up the back of her head. Negative tests for meningitis.  Skin: Skin is warm and dry. No rash noted. She is not diaphoretic.  Psychiatric: She has a normal mood and affect. Her behavior is normal. Judgment and thought content normal.      Assessment & Plan:

## 2015-01-05 NOTE — Patient Instructions (Addendum)
Try the muscle relaxer.   Gabapentin 300 mg once at night 1 hour before bed.  After 7 days take 1 in the morning and one at night. Only take at night if it makes you sleepy.   Follow up in 1 week.   [place] [facility] Practice Address:  Address: [address], [place], [place] [address]  [phone] X-rays until 4:15 pm.

## 2015-01-05 NOTE — Progress Notes (Signed)
Pre visit review using our clinic review tool, if applicable. No additional management support is needed unless otherwise documented below in the visit note. 

## 2015-01-06 ENCOUNTER — Telehealth: Payer: Self-pay | Admitting: Internal Medicine

## 2015-01-06 DIAGNOSIS — I2510 Atherosclerotic heart disease of native coronary artery without angina pectoris: Secondary | ICD-10-CM | POA: Diagnosis not present

## 2015-01-06 DIAGNOSIS — Z79899 Other long term (current) drug therapy: Secondary | ICD-10-CM | POA: Diagnosis not present

## 2015-01-06 DIAGNOSIS — K219 Gastro-esophageal reflux disease without esophagitis: Secondary | ICD-10-CM | POA: Diagnosis not present

## 2015-01-06 DIAGNOSIS — R7989 Other specified abnormal findings of blood chemistry: Secondary | ICD-10-CM | POA: Diagnosis not present

## 2015-01-06 DIAGNOSIS — R799 Abnormal finding of blood chemistry, unspecified: Secondary | ICD-10-CM | POA: Diagnosis not present

## 2015-01-06 NOTE — Telephone Encounter (Signed)
The patient called wanting her xray results .

## 2015-01-06 NOTE — Telephone Encounter (Signed)
Notified pt of results.  Pt to call back with decision as to whether or not she wants an urgent referral to neurology.

## 2015-01-09 DIAGNOSIS — M542 Cervicalgia: Secondary | ICD-10-CM | POA: Insufficient documentation

## 2015-01-09 NOTE — Assessment & Plan Note (Addendum)
Painful neck muscles and with movement. Believe this is cervical spine related. Will obtain C-spine x-rays. Try gabapentin and muscle relaxer. FU 1 week.

## 2015-01-12 ENCOUNTER — Encounter: Payer: Self-pay | Admitting: Nurse Practitioner

## 2015-01-12 ENCOUNTER — Ambulatory Visit (INDEPENDENT_AMBULATORY_CARE_PROVIDER_SITE_OTHER): Payer: Medicare Other | Admitting: Nurse Practitioner

## 2015-01-12 VITALS — BP 128/70 | HR 69 | Temp 98.2°F | Resp 14 | Wt 159.8 lb

## 2015-01-12 DIAGNOSIS — M542 Cervicalgia: Secondary | ICD-10-CM | POA: Diagnosis not present

## 2015-01-12 DIAGNOSIS — I6529 Occlusion and stenosis of unspecified carotid artery: Secondary | ICD-10-CM

## 2015-01-12 MED ORDER — NITROGLYCERIN 0.4 MG SL SUBL: 0.4000 mg | SUBLINGUAL_TABLET | SUBLINGUAL | Status: DC | PRN

## 2015-01-12 NOTE — Progress Notes (Signed)
Pre visit review using our clinic review tool, if applicable. No additional management support is needed unless otherwise documented below in the visit note. 

## 2015-01-12 NOTE — Assessment & Plan Note (Signed)
Pain radiating from neck to head is improved. She did not tolerate the Gabapentin and is not taking it. FU prn worsening/failure to improve.

## 2015-01-12 NOTE — Progress Notes (Signed)
Subjective:    Patient ID: Sabrina Montoya, female    DOB: 1941-08-08, 74 y.o.   MRN: [id number]  HPI  Sabrina Montoya is following up from her radiating neck to head pain from last week.   1) 6 days of headaches and then they stopped. She reports calling the doctor who did her carotid surgeries and he was in agreement that they were not likely TIA's. She also had trouble with intermittent right ear pain and right side maxillary tenderness. The Alkalol nasal rinses were helpful.   2) Refill of Nitro- expired 2013. Would like a refill to have on hand.   3) Reviewed Lipid panel at her request and talked about her cutting the Crestor 40 mg in half to last longer due to cost.    Review of Systems  Constitutional: Negative for fever, chills, diaphoresis and fatigue.  Respiratory: Negative for chest tightness, shortness of breath and wheezing.   Cardiovascular: Negative for chest pain, palpitations and leg swelling.  Gastrointestinal: Negative for nausea, vomiting, diarrhea and constipation.  Skin: Negative for rash.  Neurological: Negative for dizziness, weakness, numbness and headaches.  Psychiatric/Behavioral: The patient is not nervous/anxious.    Past Medical History  Diagnosis Date  . Diverticulitis of colon   . Rheumatic fever     possible at age 82  . Tobacco abuse   . Hyperlipidemia   . Abdominal aortic aneurysm without mention of rupture     infrarenal, stable, folllowed by Leotis Pain  . CAD (coronary artery disease), autologous vein bypass graft   . Peripheral vascular disease     s/p CEA   . Tobacco abuse   . B12 deficiency   . Acute posthemorrhagic anemia   . Cardiac dysrhythmia, unspecified   . Neuropathy   . Reflux esophagitis   . Sliding hiatal hernia   . GERD (gastroesophageal reflux disease)   . Colon polyp   . IBS (irritable bowel syndrome)     History   Social History  . Marital Status: Widowed    Spouse Name: N/A  . Number of Children: N/A  . Years of  Education: N/A   Occupational History  . Not on file.   Social History Main Topics  . Smoking status: Former Smoker -- 0.50 packs/day for 30 years    Types: Cigarettes    Quit date: 04/01/2012  . Smokeless tobacco: Never Used     Comment: quit for 2 years after sinus infection and 6 months after heart surgery  . Alcohol Use: No  . Drug Use: No  . Sexual Activity: Not on file   Other Topics Concern  . Not on file   Social History Narrative   Lives with spouse [name]    Past Surgical History  Procedure Laterality Date  . Closed manipulation shoulder      left shoulder post physical therapy, Right shoulder redo (toggle bolts)  . Rotator cuff repair  2002    right shoulder Dr. Nadean Corwin, [place] in [place]      Carotid stenosis found during workup for dysphagia,  [facility]  . Lumbar disc surgery  1971    L5, unssuccessful, fusion in 1983 successful (2 lumbar)  . Abdominal surgery  1976    for pain secondary to scar tissue, s/p apply  . 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 Hernia repair  may 2011    Dr. Pat Patrick  .  Coronary artery bypass graft  2006    s/p triple bypass surgery, [facility]  . Cholecystectomy  2002    Dr. Pat Patrick  . Coronary angiography  01/2011    one occluded artery with collateralization, other arteries patent  . Hernia repair    . Appendectomy  1974  . Colonoscopy  09/01/14    Family History  Problem Relation Age of Onset  . Cancer Sister     cervical cancer  . Diabetes Sister   . Heart disease Brother     coronary artery disease  . Other Brother     suicide  . Diabetes Mother   . Heart disease Mother   . Diabetes Sister   . Other Father     suicide  . Cancer Sister     breast  . Other Other     colon resection due to inflammation -nephew    Allergies  Allergen Reactions  . Niacin And Related Hives  . Sulfa Drugs Cross Reactors Nausea Only  . Tetracyclines &  Related Hives  . Latex Rash  . Simvastatin Rash    *Antihyperlipidemics*; elevated LFT's.     Current Outpatient Prescriptions on File Prior to Visit  Medication Sig Dispense Refill  . albuterol (PROVENTIL HFA;VENTOLIN HFA) 108 (90 BASE) MCG/ACT inhaler Inhale 2 puffs into the lungs every 6 (six) hours as needed for wheezing or shortness of breath.    Marland Kitchen aspirin 81 MG tablet Take 81 mg by mouth daily.    . busPIRone (BUSPAR) 7.5 MG tablet TAKE 1 TABLET BY MOUTH TWO TO THREE TIMES A DAY 90 tablet 5  . cholecalciferol (VITAMIN D) 1000 UNITS tablet Take 1,000 Units by mouth daily.    . clonazePAM (KLONOPIN) 0.5 MG tablet TAKE 1 TABLET BY MOUTH TWICE DAILY AS NEEDED. 60 tablet 3  . conjugated estrogens (PREMARIN) vaginal cream Place 1 Applicatorful vaginally at bedtime. 42.5 g 12  . dicyclomine (BENTYL) 20 MG tablet Take 20 mg by mouth 2 (two) times daily.    . famotidine (PEPCID) 20 MG tablet TAKE 1 TABLET BY MOUTH TWICE DAILY BEFORE BREAKFAST AND DINNER 60 tablet 5  . magnesium oxide (MAG-OX) 400 (241.3 MG) MG tablet TAKE ONE TABLET BY MOUTH TWICE DAILY 60 tablet 5  . metoprolol tartrate (LOPRESSOR) 25 MG tablet TAKE 1/2 TABLET BY MOUTH TWICE DAILY 180 tablet 1  . polyethylene glycol powder (GLYCOLAX/MIRALAX) powder 255 grams one bottle for colonoscopy prep 255 g 0  . rosuvastatin (CRESTOR) 40 MG tablet Take 1 tablet (40 mg total) by mouth daily. 90 tablet 1   No current facility-administered medications on file prior to visit.       Objective:   Physical Exam  Constitutional: She is oriented to person, place, and time. She appears well-developed and well-nourished. No distress.  BP 128/70 mmHg  Pulse 69  Temp(Src) 98.2 F (36.8 C) (Oral)  Resp 14  Wt 159 lb 12.8 oz (72.485 kg)  SpO2 98%   HENT:  Head: Normocephalic and atraumatic.  Right Ear: External ear normal.  Left Ear: External ear normal.  Eyes: Right eye exhibits no discharge. Left eye exhibits no discharge. No scleral  icterus.  Neck: Normal range of motion. Neck supple.  Cardiovascular: Normal rate, regular rhythm, normal heart sounds and intact distal pulses.  Exam reveals no gallop and no friction rub.   No murmur heard. Pulmonary/Chest: Effort normal and breath sounds normal. No respiratory distress. She has no wheezes. She has no rales. She exhibits  no tenderness.  Neurological: She is alert and oriented to person, place, and time. No cranial nerve deficit. She exhibits normal muscle tone. Coordination normal.  Skin: Skin is warm and dry. No rash noted. She is not diaphoretic.  Psychiatric: She has a normal mood and affect. Her behavior is normal. Judgment and thought content normal.      Assessment & Plan:

## 2015-01-12 NOTE — Assessment & Plan Note (Signed)
Taking Crestor 40 mg and cutting in half to make them last longer. I do not see any problem with this as it is not controlled release.

## 2015-01-19 ENCOUNTER — Telehealth: Payer: Self-pay | Admitting: Internal Medicine

## 2015-01-19 NOTE — Telephone Encounter (Signed)
Patient scheduled for 01/26/15 office Visit to discus headaches.

## 2015-01-19 NOTE — Telephone Encounter (Signed)
Called patient to schedule appointment to discuss patient headaches, left message to call office.

## 2015-01-26 ENCOUNTER — Ambulatory Visit (INDEPENDENT_AMBULATORY_CARE_PROVIDER_SITE_OTHER): Payer: Medicare Other | Admitting: Internal Medicine

## 2015-01-26 ENCOUNTER — Encounter: Payer: Self-pay | Admitting: Internal Medicine

## 2015-01-26 VITALS — BP 100/60 | HR 80 | Temp 98.4°F | Resp 16 | Ht 65.0 in | Wt 160.2 lb

## 2015-01-26 DIAGNOSIS — F450 Somatization disorder: Secondary | ICD-10-CM

## 2015-01-26 DIAGNOSIS — K2100 Gastro-esophageal reflux disease with esophagitis, without bleeding: Secondary | ICD-10-CM

## 2015-01-26 DIAGNOSIS — R194 Change in bowel habit: Secondary | ICD-10-CM

## 2015-01-26 DIAGNOSIS — M25551 Pain in right hip: Secondary | ICD-10-CM

## 2015-01-26 DIAGNOSIS — R195 Other fecal abnormalities: Secondary | ICD-10-CM

## 2015-01-26 DIAGNOSIS — F32A Depression, unspecified: Secondary | ICD-10-CM

## 2015-01-26 DIAGNOSIS — F329 Major depressive disorder, single episode, unspecified: Secondary | ICD-10-CM | POA: Diagnosis not present

## 2015-01-26 DIAGNOSIS — I6529 Occlusion and stenosis of unspecified carotid artery: Secondary | ICD-10-CM

## 2015-01-26 MED ORDER — BUPROPION HCL ER (XL) 150 MG PO TB24: 150.0000 mg | ORAL_TABLET | Freq: Every day | ORAL | Status: DC

## 2015-01-26 MED ORDER — POLYETHYLENE GLYCOL 3350 17 GM/SCOOP PO POWD: 17.0000 g | Freq: Every day | ORAL | Status: DC

## 2015-01-26 MED ORDER — NITROGLYCERIN 0.4 MG SL SUBL: 0.4000 mg | SUBLINGUAL_TABLET | SUBLINGUAL | Status: DC | PRN

## 2015-01-26 MED ORDER — HYDROCODONE-ACETAMINOPHEN 5-325 MG PO TABS: 1.0000 | ORAL_TABLET | Freq: Four times a day (QID) | ORAL | Status: DC | PRN

## 2015-01-26 MED ORDER — ESOMEPRAZOLE MAGNESIUM 40 MG PO CPDR: 40.0000 mg | DELAYED_RELEASE_CAPSULE | Freq: Every day | ORAL | Status: DC

## 2015-01-26 NOTE — Patient Instructions (Addendum)
I am prescribing vicodin  From your hip pain ,  Take one in the evening before bedtime  .   Take 2 tylenol you get up inthe morning,  You may use the 2nd vicodin in the early afternoon  If pain is severe   The miralax for your boels after dinner by itself ,  An hour before or after your other meds  Save the clonazepam for beditme IF NEEDED   Start wellbutrin for depression and low energy.  Take it in  The morning or after with breakfast   b12 shot today to see if it helps   Changing your morning pepcid to nexium. For your heart burn,.    We will make a Gi referral to Dr Epimenio Foot in [place] to follow up on your persistent heartburn

## 2015-01-26 NOTE — Progress Notes (Signed)
Patient ID: Sabrina Montoya, female   DOB: 18-Oct-1941, 74 y.o.   MRN: [id number]  Patient Active Problem List   Diagnosis Date Noted  . Depression with somatization 01/27/2015  . Cervicalgia 01/09/2015  . Chronic pain of right hip 08/29/2014  . Cystocele 08/29/2014  . Breast pain 08/29/2014  . Painful lumpy right breast 08/29/2014  . Change in bowel habits 08/18/2014  . Change in stool caliber 07/30/2014  . 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 Hot flashes 05/02/2014  . Anemia 05/02/2014  . Globus sensation 02/15/2014  . TIA (transient ischemic attack) 09/11/2013  . Dizziness and giddiness 09/02/2013  . Arrhythmia 07/01/2013  . Neuropathy 07/01/2013  . Overweight (BMI 25.0-29.9) 03/16/2013  . Anxiety state 03/16/2013  . Chronic reflux esophagitis 12/29/2012  . Right hip pain 12/29/2012  . History of tobacco abuse 07/03/2012  . Hypomagnesemia 06/01/2012  . Bradycardia 06/01/2012  . CAD (coronary artery disease), autologous vein bypass graft   . Peripheral vascular disease   . Arthritis 02/29/2012  . Aneurysm of abdominal aorta 01/05/2012  . Asymptomatic carotid artery stenosis 01/05/2012  . Hyperlipidemia   . Osteopenia 08/29/2011  . Vitamin D deficiency 08/29/2011    Subjective:  CC:   Chief Complaint  Patient presents with  . Headache    To discuss headache,    HPI:   Sabrina Montoya is a 74 y.o. female who presents for   appt made for new onset headaches mentioned during her urogynecology post op top follow up on March 3.  Patient states today tha t her  headaches have resolved,  She has not taken any pain medication since discharge.   She has multiple chronic complaints today  Cc: fatigue.  Not sleeping well bcause her right hip is hurting,  And she is having trouble staying awake during the day when she "so many things she has to get done>'   2) frequent formed stools , 6 to 7 daily.  Had a hysterectomy 6 weeks ago, stools are still small calliber   HAS NOT STARTED A  BULK FORMing laxative.   3) persistent postprandial reflux on pepcid 20 mg bid  ,  never had the EGD last year with Olevia Perches, was sent to hosptial instead with stroke like symptoms     Past Medical History  Diagnosis Date  . Diverticulitis of colon   . Rheumatic fever     possible at age 63  . Tobacco abuse   . Hyperlipidemia   . Abdominal aortic aneurysm without mention of rupture     infrarenal, stable, folllowed by Leotis Pain  . CAD (coronary artery disease), autologous vein bypass graft   . Peripheral vascular disease     s/p CEA   . Tobacco abuse   . B12 deficiency   . Acute posthemorrhagic anemia   . Cardiac dysrhythmia, unspecified   . Neuropathy   . Reflux esophagitis   . Sliding hiatal hernia   . GERD (gastroesophageal reflux disease)   . Colon polyp   . IBS (irritable bowel syndrome)     Past Surgical History  Procedure Laterality Date  . Closed manipulation shoulder      left shoulder post physical therapy, Right shoulder redo (toggle bolts)  . Rotator cuff repair  2002    right shoulder Dr. Nadean Corwin, [place] in [place]      Carotid stenosis found during workup for dysphagia,  [facility]  . Lumbar disc surgery  1971  L5, unssuccessful, fusion in 1983 successful (2 lumbar)  . Abdominal surgery  1976    for pain secondary to scar tissue, s/p apply  . 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 Hernia repair  may 2011    Dr. Pat Patrick  . Coronary artery bypass graft  2006    s/p triple bypass surgery, [facility]  . Cholecystectomy  2002    Dr. Pat Patrick  . Coronary angiography  01/2011    one occluded artery with collateralization, other arteries patent  . Hernia repair    . Appendectomy  1974  . Colonoscopy  09/01/14       The following portions of the patient's history were reviewed and updated as appropriate: Allergies, current medications, and problem list.    Review of Systems:    Patient denies headache, fevers, malaise, unintentional weight loss, skin rash, eye pain, sinus congestion and sinus pain, sore throat, dysphagia,  hemoptysis , cough, dyspnea, wheezing, chest pain, palpitations, orthopnea, edema, abdominal pain, nausea, melena, diarrhea, constipation, flank pain, dysuria, hematuria, urinary  Frequency, nocturia, numbness, tingling, seizures,  Focal weakness, Loss of consciousness,  Tremor, insomnia, depression, anxiety, and suicidal ideation.     History   Social History  . Marital Status: Widowed    Spouse Name: N/A  . Number of Children: N/A  . Years of Education: N/A   Occupational History  . Not on file.   Social History Main Topics  . Smoking status: Former Smoker -- 0.50 packs/day for 30 years    Types: Cigarettes    Quit date: 04/01/2012  . Smokeless tobacco: Never Used     Comment: quit for 2 years after sinus infection and 6 months after heart surgery  . Alcohol Use: No  . Drug Use: No  . Sexual Activity: Not on file   Other Topics Concern  . Not on file   Social History Narrative   Lives with spouse [name]    Objective:  Filed Vitals:   01/26/15 1617  BP: 100/60  Pulse: 80  Temp: 98.4 F (36.9 C)  Resp: 16     General appearance: alert, cooperative and appears stated age Ears: normal TM's and external ear canals both ears Throat: lips, mucosa, and tongue normal; teeth and gums normal Neck: no adenopathy, no carotid bruit, supple, symmetrical, trachea midline and thyroid not enlarged, symmetric, no tenderness/mass/nodules Back: symmetric, no curvature. ROM normal. No CVA tenderness. Lungs: clear to auscultation bilaterally Heart: regular rate and rhythm, S1, S2 normal, no murmur, click, rub or gallop Abdomen: soft, non-tender; bowel sounds normal; no masses,  no organomegaly Pulses: 2+ and symmetric Skin: Skin color, texture, turgor normal. No rashes or lesions Lymph nodes: Cervical, supraclavicular, and axillary nodes  normal.  Assessment and Plan:  Change in stool caliber Colonoscopy was normal,  Hysterectomy did not improve caliber of stool thus far.  Recommend bulk forming laxative.    Right hip pain She has y mild degenerative changes of the hip joint  With some loss of joint space and mild spurring by repeat plain films done at [facility].  She was referred to Philmore Pali for evaluation      Depression with somatization She has multiple somatic complaints that  Have been investigated repeatedly and are still under investigation.  Given her trouble concentrating and lack of energy,  Trial of wellbutrin   Chronic reflux esophagitis No reflux was seen on rprior barium swallow and EGD.  Given her chronically low mg requiring IV transfusions,  She  had her PPI discontinued but has had worsening symptoms on n H2 blockers alone,  Will resume omeprazole 20 mg daily  And continue evening famotidine  20 mg prn       A total of 40 minutes was spent with patient more than half of which was spent in counseling patient on the above mentioned issues , reviewing and explaining recent labs and imaging studies done, and coordination of care.  Updated Medication List Outpatient Encounter Prescriptions as of 01/26/2015  Medication Sig  . albuterol (PROVENTIL HFA;VENTOLIN HFA) 108 (90 BASE) MCG/ACT inhaler Inhale 2 puffs into the lungs every 6 (six) hours as needed for wheezing or shortness of breath.  Marland Kitchen aspirin 81 MG tablet Take 81 mg by mouth daily.  Marland Kitchen buPROPion (WELLBUTRIN XL) 150 MG 24 hr tablet Take 1 tablet (150 mg total) by mouth daily. IN THE MORNING  . busPIRone (BUSPAR) 7.5 MG tablet TAKE 1 TABLET BY MOUTH TWO TO THREE TIMES A DAY (Patient not taking: Reported on 01/26/2015)  . cholecalciferol (VITAMIN D) 1000 UNITS tablet Take 1,000 Units by mouth daily.  . clonazePAM (KLONOPIN) 0.5 MG tablet TAKE 1 TABLET BY MOUTH TWICE DAILY AS NEEDED.  Marland Kitchen conjugated estrogens (PREMARIN) vaginal cream Place 1  Applicatorful vaginally at bedtime.  . dicyclomine (BENTYL) 20 MG tablet Take 20 mg by mouth 2 (two) times daily.  . famotidine (PEPCID) 20 MG tablet TAKE 1 TABLET BY MOUTH TWICE DAILY BEFORE BREAKFAST AND DINNER  . HYDROcodone-acetaminophen (NORCO/VICODIN) 5-325 MG per tablet Take 1 tablet by mouth every 6 (six) hours as needed for moderate pain.  . magnesium oxide (MAG-OX) 400 (241.3 MG) MG tablet TAKE ONE TABLET BY MOUTH TWICE DAILY  . metoprolol tartrate (LOPRESSOR) 25 MG tablet TAKE 1/2 TABLET BY MOUTH TWICE DAILY  . nitroGLYCERIN (NITROSTAT) 0.4 MG SL tablet Place 1 tablet (0.4 mg total) under the tongue every 5 (five) minutes as needed. As needed for chest pain  . polyethylene glycol powder (GLYCOLAX/MIRALAX) powder 255 grams one bottle for colonoscopy prep  . polyethylene glycol powder (GLYCOLAX/MIRALAX) powder Take 17 g by mouth daily.  . rosuvastatin (CRESTOR) 40 MG tablet Take 1 tablet (40 mg total) by mouth daily.  . [DISCONTINUED] esomeprazole (NEXIUM) 40 MG capsule Take 1 capsule (40 mg total) by mouth daily at 12 noon.  . [DISCONTINUED] nitroGLYCERIN (NITROSTAT) 0.4 MG SL tablet Place 1 tablet (0.4 mg total) under the tongue every 5 (five) minutes as needed. As needed for chest pain     Orders Placed This Encounter  Procedures  . Ambulatory referral to Orthopedic Surgery    Return in about 4 weeks (around 02/23/2015).

## 2015-01-27 ENCOUNTER — Encounter: Payer: Self-pay | Admitting: Internal Medicine

## 2015-01-27 ENCOUNTER — Telehealth: Payer: Self-pay | Admitting: Internal Medicine

## 2015-01-27 DIAGNOSIS — F329 Major depressive disorder, single episode, unspecified: Secondary | ICD-10-CM | POA: Insufficient documentation

## 2015-01-27 DIAGNOSIS — F450 Somatization disorder: Secondary | ICD-10-CM

## 2015-01-27 MED ORDER — OMEPRAZOLE 20 MG PO CPDR: 20.0000 mg | DELAYED_RELEASE_CAPSULE | Freq: Every day | ORAL | Status: DC

## 2015-01-27 NOTE — Telephone Encounter (Signed)
nexium changed to omeprazole  Aware of potential drug interaction between gabapentin and wellbutrin

## 2015-01-27 NOTE — Assessment & Plan Note (Signed)
No reflux was seen on rprior barium swallow and EGD.  Given her chronically low mg requiring IV transfusions,  She  had her PPI discontinued but has had worsening symptoms on n H2 blockers alone,  Will resume omeprazole 20 mg daily  And continue evening famotidine  20 mg prn

## 2015-01-27 NOTE — Assessment & Plan Note (Signed)
She has y mild degenerative changes of the hip joint  With some loss of joint space and mild spurring by repeat plain films done at [facility].  She was referred to Philmore Pali for evaluation

## 2015-01-27 NOTE — Assessment & Plan Note (Signed)
She has multiple somatic complaints that  Have been investigated repeatedly and are still under investigation.  Given her trouble concentrating and lack of energy,  Trial of wellbutrin

## 2015-01-27 NOTE — Assessment & Plan Note (Signed)
Colonoscopy was normal,  Hysterectomy did not improve caliber of stool thus far.  Recommend bulk forming laxative.

## 2015-01-28 NOTE — Telephone Encounter (Signed)
Patient notified. Pharmacy notifed.

## 2015-02-05 ENCOUNTER — Ambulatory Visit
Admit: 2015-02-05 | Disposition: A | Discharge status: Home / Self Care | Payer: Self-pay | Attending: Internal Medicine | Admitting: Internal Medicine

## 2015-02-05 ENCOUNTER — Telehealth: Payer: Self-pay | Admitting: Internal Medicine

## 2015-02-05 DIAGNOSIS — R799 Abnormal finding of blood chemistry, unspecified: Secondary | ICD-10-CM | POA: Diagnosis not present

## 2015-02-05 NOTE — Telephone Encounter (Signed)
Patient left message on voicemail stating she did not receive her Vicodin script patient was not aware that Vicodin is Hydrocodone with acetaminophen, once patient was advised same medication she realized she had script.

## 2015-02-06 ENCOUNTER — Other Ambulatory Visit: Payer: Self-pay | Admitting: Internal Medicine

## 2015-02-12 DIAGNOSIS — I6523 Occlusion and stenosis of bilateral carotid arteries: Secondary | ICD-10-CM | POA: Diagnosis not present

## 2015-02-17 DIAGNOSIS — R799 Abnormal finding of blood chemistry, unspecified: Secondary | ICD-10-CM | POA: Diagnosis not present

## 2015-02-17 LAB — IRON AND TIBC
IRON BIND. CAP.(TOTAL): 364 (ref 250–450)
Iron Saturation: 17.6
Iron: 64 ug/dL
UNBOUND IRON-BIND. CAP.: 300.1

## 2015-02-17 LAB — MAGNESIUM: MAGNESIUM: 2.2 mg/dL

## 2015-02-17 LAB — FERRITIN: FERRITIN (ARMC): 90 ng/mL

## 2015-02-17 LAB — CANCER CENTER HEMOGLOBIN: HGB: 11.2 g/dL — AB (ref 12.0–16.0)

## 2015-02-18 DIAGNOSIS — M5136 Other intervertebral disc degeneration, lumbar region: Secondary | ICD-10-CM | POA: Diagnosis not present

## 2015-02-18 DIAGNOSIS — M7061 Trochanteric bursitis, right hip: Secondary | ICD-10-CM | POA: Diagnosis not present

## 2015-03-09 DIAGNOSIS — I6523 Occlusion and stenosis of bilateral carotid arteries: Secondary | ICD-10-CM | POA: Diagnosis not present

## 2015-03-09 DIAGNOSIS — R202 Paresthesia of skin: Secondary | ICD-10-CM | POA: Diagnosis not present

## 2015-03-15 DIAGNOSIS — H2513 Age-related nuclear cataract, bilateral: Secondary | ICD-10-CM | POA: Diagnosis not present

## 2015-03-25 ENCOUNTER — Other Ambulatory Visit: Payer: Self-pay | Admitting: Internal Medicine

## 2015-03-31 ENCOUNTER — Ambulatory Visit: Payer: Medicare Other | Admitting: Internal Medicine

## 2015-03-31 ENCOUNTER — Other Ambulatory Visit: Payer: Medicare Other

## 2015-04-14 DIAGNOSIS — H2513 Age-related nuclear cataract, bilateral: Secondary | ICD-10-CM | POA: Diagnosis not present

## 2015-04-15 ENCOUNTER — Other Ambulatory Visit: Payer: Medicare Other

## 2015-04-15 ENCOUNTER — Ambulatory Visit: Payer: Medicare Other | Admitting: Internal Medicine

## 2015-04-22 ENCOUNTER — Other Ambulatory Visit: Payer: Self-pay | Admitting: Internal Medicine

## 2015-04-23 ENCOUNTER — Inpatient Hospital Stay: Payer: Medicare Other | Attending: Family Medicine | Admitting: Family Medicine

## 2015-04-23 ENCOUNTER — Inpatient Hospital Stay: Payer: Medicare Other

## 2015-04-23 VITALS — BP 137/70 | HR 78 | Temp 96.5°F | Wt 160.3 lb

## 2015-04-23 DIAGNOSIS — D649 Anemia, unspecified: Secondary | ICD-10-CM | POA: Diagnosis not present

## 2015-04-23 DIAGNOSIS — Z79899 Other long term (current) drug therapy: Secondary | ICD-10-CM | POA: Diagnosis not present

## 2015-04-23 DIAGNOSIS — D508 Other iron deficiency anemias: Secondary | ICD-10-CM

## 2015-04-23 LAB — IRON AND TIBC
Iron: 83 ug/dL (ref 28–170)
SATURATION RATIOS: 24 % (ref 10.4–31.8)
TIBC: 351 ug/dL (ref 250–450)
UIBC: 268 ug/dL

## 2015-04-23 LAB — MAGNESIUM: Magnesium: 2.3 mg/dL (ref 1.7–2.4)

## 2015-04-23 LAB — CBC WITH DIFFERENTIAL/PLATELET
BASOS ABS: 0 10*3/uL (ref 0–0.1)
BASOS PCT: 1 %
Eosinophils Absolute: 0.2 10*3/uL (ref 0–0.7)
Eosinophils Relative: 4 %
HCT: 34.2 % — ABNORMAL LOW (ref 35.0–47.0)
HEMOGLOBIN: 11.4 g/dL — AB (ref 12.0–16.0)
Lymphocytes Relative: 35 %
Lymphs Abs: 1.6 10*3/uL (ref 1.0–3.6)
MCH: 28 pg (ref 26.0–34.0)
MCHC: 33.3 g/dL (ref 32.0–36.0)
MCV: 84.1 fL (ref 80.0–100.0)
MONOS PCT: 7 %
Monocytes Absolute: 0.3 10*3/uL (ref 0.2–0.9)
NEUTROS ABS: 2.4 10*3/uL (ref 1.4–6.5)
NEUTROS PCT: 53 %
PLATELETS: 236 10*3/uL (ref 150–440)
RBC: 4.07 MIL/uL (ref 3.80–5.20)
RDW: 15 % — AB (ref 11.5–14.5)
WBC: 4.5 10*3/uL (ref 3.6–11.0)

## 2015-04-23 LAB — BASIC METABOLIC PANEL
Anion gap: 8 (ref 5–15)
BUN: 31 mg/dL — AB (ref 6–20)
CO2: 26 mmol/L (ref 22–32)
CREATININE: 1.25 mg/dL — AB (ref 0.44–1.00)
Calcium: 9.2 mg/dL (ref 8.9–10.3)
Chloride: 103 mmol/L (ref 101–111)
GFR calc Af Amer: 49 mL/min — ABNORMAL LOW (ref >60)
GFR calc non Af Amer: 42 mL/min — ABNORMAL LOW (ref >60)
GLUCOSE: 145 mg/dL — AB (ref 65–99)
Potassium: 4.7 mmol/L (ref 3.5–5.1)
SODIUM: 137 mmol/L (ref 135–145)

## 2015-04-23 LAB — FERRITIN: Ferritin: 85 ng/mL (ref 11–307)

## 2015-04-23 NOTE — Progress Notes (Signed)
Sabrina Montoya  [phone]  [phone]     Sabrina Montoya DOB: Oct 05, 1941  MR#: [id number]  WNI#:[id number]  Patient Care Team: Sabrina Mc, MD as PCP - General (Internal Medicine) Sabrina Mc, MD (Internal Medicine) Sabrina Bellow, MD (General Surgery)  CHIEF COMPLAINT:  Chief Complaint  Patient presents with  . Follow-up   History of long-standing hypomagnesemia, anemia.  INTERVAL HISTORY:  Patient is here for continued follow-up Re: Hypomagnesemia. She was previously unable to maintain magnesium level oral magnesium. Has received IV magnesium infusions multiple times in past. Overall she reports feeling well today other than diarrhea. Patient has been taking oral magnesium oxide 400 mg 1 tablet by mouth twice daily. Reports that when she started magnesium oxide is when her diarrhea started. She otherwise feels well and denies any other complaints.  REVIEW OF SYSTEMS:   Review of Systems  Constitutional: Negative for fever, chills, weight loss, malaise/fatigue and diaphoresis.  HENT: Negative for congestion, ear discharge, ear Montoya, hearing loss, nosebleeds, sore throat and tinnitus.   Eyes: Negative for blurred vision, double vision, photophobia, Montoya, discharge and redness.  Respiratory: Negative for cough, hemoptysis, sputum production, shortness of breath, wheezing and stridor.   Cardiovascular: Negative for chest Montoya, palpitations, orthopnea, claudication, leg swelling and PND.  Gastrointestinal: Positive for diarrhea. Negative for heartburn, nausea, vomiting, abdominal Montoya, constipation, blood in stool and melena.  Genitourinary: Negative.   Musculoskeletal: Negative.   Skin: Negative.   Neurological: Negative for dizziness, tingling, focal weakness, seizures, weakness and headaches.  Endo/Heme/Allergies: Does not bruise/bleed easily.  Psychiatric/Behavioral: Negative for depression. The patient is not nervous/anxious and does not have  insomnia.     As per HPI. Otherwise, a complete review of systems is negatve.  ONCOLOGY HISTORY:  No history exists.    PAST MEDICAL HISTORY: Past Medical History  Diagnosis Date  . Diverticulitis of colon   . Rheumatic fever     possible at age 42  . Tobacco abuse   . Hyperlipidemia   . Abdominal aortic aneurysm without mention of rupture     infrarenal, stable, folllowed by Sabrina Montoya  . CAD (coronary artery disease), autologous vein bypass graft   . Peripheral vascular disease     s/p CEA   . Tobacco abuse   . B12 deficiency   . Acute posthemorrhagic anemia   . Cardiac dysrhythmia, unspecified   . Neuropathy   . Reflux esophagitis   . Sliding hiatal hernia   . GERD (gastroesophageal reflux disease)   . Colon polyp   . IBS (irritable bowel syndrome)     PAST SURGICAL HISTORY: Past Surgical History  Procedure Laterality Date  . Closed manipulation shoulder      left shoulder post physical therapy, Right shoulder redo (toggle bolts)  . Rotator cuff repair  2002    right shoulder Dr. Nadean Montoya, [place] in [place]      Carotid stenosis found during workup for dysphagia,  [facility]  . Lumbar disc surgery  1971    L5, unssuccessful, fusion in 1983 successful (2 lumbar)  . Abdominal surgery  1976    for Montoya secondary to scar tissue, s/p apply  . Cervical disc surgery  2009    Dr. Hoover Montoya for cervical cord stenosis, C5-6 diskectomy  . Hernia repair  may 2011    Dr. Pat Montoya  . Coronary artery bypass graft  2006    s/p triple bypass surgery, [facility]  .  Cholecystectomy  2002    Dr. Pat Montoya  . Coronary angiography  01/2011    one occluded artery with collateralization, other arteries patent  . Hernia repair    . Appendectomy  1974  . Colonoscopy  09/01/14    FAMILY HISTORY Family History  Problem Relation Age of Onset  . Cancer Sister     cervical cancer  . Diabetes Sister   . Heart disease Brother     coronary artery disease  . Other  Brother     suicide  . Diabetes Mother   . Heart disease Mother   . Diabetes Sister   . Other Father     suicide  . Cancer Sister     breast  . Other Other     colon resection due to inflammation -nephew    GYNECOLOGIC HISTORY:  No LMP recorded. Patient is postmenopausal.     ADVANCED DIRECTIVES:    HEALTH MAINTENANCE: History  Substance Use Topics  . Smoking status: Former Smoker -- 0.50 packs/day for 30 years    Types: Cigarettes    Quit date: 04/01/2012  . Smokeless tobacco: Never Used     Comment: quit for 2 years after sinus infection and 6 months after heart surgery  . Alcohol Use: No     Colonoscopy:  PAP:  Bone density:  Lipid panel:  Allergies  Allergen Reactions  . Niacin And Related Hives  . Sulfa Drugs Cross Reactors Nausea Only  . Tetracyclines & Related Hives  . Latex Rash  . Simvastatin Rash    *Antihyperlipidemics*; elevated LFT's.     Current Outpatient Prescriptions  Medication Sig Dispense Refill  . aspirin 81 MG tablet Take 81 mg by mouth daily.    Marland Kitchen buPROPion (WELLBUTRIN XL) 150 MG 24 hr tablet TAKE 1 TABLET BY MOUTH EACH MORNING. 30 tablet 2  . busPIRone (BUSPAR) 7.5 MG tablet TAKE 1 TABLET BY MOUTH TWO TO THREE TIMES A DAY 90 tablet 5  . cholecalciferol (VITAMIN D) 1000 UNITS tablet Take 1,000 Units by mouth daily.    . clonazePAM (KLONOPIN) 0.5 MG tablet TAKE 1 TABLET BY MOUTH TWICE DAILY AS NEEDED. 60 tablet 3  . conjugated estrogens (PREMARIN) vaginal cream Place 1 Applicatorful vaginally at bedtime. 42.5 g 12  . dicyclomine (BENTYL) 20 MG tablet Take 20 mg by mouth 2 (two) times daily.    . magnesium oxide (MAG-OX) 400 (241.3 MG) MG tablet TAKE ONE TABLET BY MOUTH TWICE DAILY 60 tablet 5  . metoprolol tartrate (LOPRESSOR) 25 MG tablet TAKE 1/2 TABLET BY MOUTH TWICE DAILY 30 tablet 6  . nitroGLYCERIN (NITROSTAT) 0.4 MG SL tablet Place 1 tablet (0.4 mg total) under the tongue every 5 (five) minutes as needed. As needed for chest Montoya 10  tablet 1  . omeprazole (PRILOSEC) 20 MG capsule TAKE 1 CAPSULE BY MOUTH ONCE DAILY. 30 capsule 5  . rosuvastatin (CRESTOR) 40 MG tablet Take 1 tablet (40 mg total) by mouth daily. 90 tablet 1  . albuterol (PROVENTIL HFA;VENTOLIN HFA) 108 (90 BASE) MCG/ACT inhaler Inhale 2 puffs into the lungs every 6 (six) hours as needed for wheezing or shortness of breath.    . famotidine (PEPCID) 20 MG tablet TAKE 1 TABLET BY MOUTH TWICE DAILY BEFORE BREAKFAST AND DINNER (Patient not taking: Reported on 04/23/2015) 60 tablet 5  . HYDROcodone-acetaminophen (NORCO/VICODIN) 5-325 MG per tablet Take 1 tablet by mouth every 6 (six) hours as needed for moderate Montoya. (Patient not taking: Reported on 04/23/2015) 60  tablet 0  . polyethylene glycol powder (GLYCOLAX/MIRALAX) powder 255 grams one bottle for colonoscopy prep (Patient not taking: Reported on 04/23/2015) 255 g 0  . polyethylene glycol powder (GLYCOLAX/MIRALAX) powder Take 17 g by mouth daily. (Patient not taking: Reported on 04/23/2015) 3350 g 1   No current facility-administered medications for this visit.    OBJECTIVE: BP 137/70 mmHg  Pulse 78  Temp(Src) 96.5 F (35.8 C) (Tympanic)  Wt 160 lb 4.4 oz (72.7 kg)   Body mass index is 26.67 kg/(m^2).    ECOG FS:1 - Symptomatic but completely ambulatory  General: Well-developed, well-nourished, no acute distress. Eyes: Pink conjunctiva, anicteric sclera. HEENT: Normocephalic, moist mucous membranes, clear oropharnyx. Lungs: Clear to auscultation bilaterally. Heart: Regular rate and rhythm. No rubs, murmurs, or gallops. Abdomen: Soft, nontender, nondistended. No organomegaly noted, normoactive bowel sounds. Musculoskeletal: No edema, cyanosis, or clubbing. Neuro: Alert, answering all questions appropriately. Cranial nerves grossly intact. Skin: No rashes or petechiae noted. Psych: Normal affect. Lymphatics: No cervical, calvicular, axillary or inguinal LAD.   LAB RESULTS:     Component Value Date/Time    NA 137 04/23/2015 1520   NA 142 05/25/2014 1059   K 4.7 04/23/2015 1520   K 4.0 12/09/2014 0951   CL 103 04/23/2015 1520   CL 106 05/25/2014 1059   CO2 26 04/23/2015 1520   CO2 29 05/25/2014 1059   GLUCOSE 145* 04/23/2015 1520   GLUCOSE 110* 05/25/2014 1059   BUN 31* 04/23/2015 1520   BUN 26* 05/25/2014 1059   CREATININE 1.25* 04/23/2015 1520   CREATININE 1.16 12/09/2014 0951   CALCIUM 9.2 04/23/2015 1520   CALCIUM 9.1 05/25/2014 1059   PROT 7.5 11/19/2014 0942   PROT 7.5 11/19/2014 0942   PROT 6.3* 05/06/2012 1924   ALBUMIN 4.4 11/19/2014 0942   ALBUMIN 4.4 11/19/2014 0942   ALBUMIN 3.5 05/06/2012 1924   AST 30 11/19/2014 0942   AST 30 11/19/2014 0942   AST 41* 05/06/2012 1924   ALT 18 11/19/2014 0942   ALT 18 11/19/2014 0942   ALT 39 05/06/2012 1924   ALKPHOS 78 11/19/2014 0942   ALKPHOS 78 11/19/2014 0942   ALKPHOS 66 05/06/2012 1924   BILITOT 0.3 11/19/2014 0942   BILITOT 0.3 11/19/2014 0942   GFRNONAA 42* 04/23/2015 1520   GFRNONAA 39* 07/29/2014 0917   GFRAA 49* 04/23/2015 1520   GFRAA 46* 07/29/2014 0917    No results found for: SPEP, UPEP  Lab Results  Component Value Date   WBC 4.5 04/23/2015   NEUTROABS 2.4 04/23/2015   HGB 11.4* 04/23/2015   HCT 34.2* 04/23/2015   MCV 84.1 04/23/2015   PLT 236 04/23/2015      Chemistry      Component Value Date/Time   NA 137 04/23/2015 1520   NA 142 05/25/2014 1059   K 4.7 04/23/2015 1520   K 4.0 12/09/2014 0951   CL 103 04/23/2015 1520   CL 106 05/25/2014 1059   CO2 26 04/23/2015 1520   CO2 29 05/25/2014 1059   BUN 31* 04/23/2015 1520   BUN 26* 05/25/2014 1059   CREATININE 1.25* 04/23/2015 1520   CREATININE 1.16 12/09/2014 0951      Component Value Date/Time   CALCIUM 9.2 04/23/2015 1520   CALCIUM 9.1 05/25/2014 1059   ALKPHOS 78 11/19/2014 0942   ALKPHOS 78 11/19/2014 0942   ALKPHOS 66 05/06/2012 1924   AST 30 11/19/2014 0942   AST 30 11/19/2014 0942   AST 41* 05/06/2012 1924  ALT 18  11/19/2014 0942   ALT 18 11/19/2014 0942   ALT 39 05/06/2012 1924   BILITOT 0.3 11/19/2014 0942   BILITOT 0.3 11/19/2014 0942       No results found for: LABCA2  No components found for: [id number]  No results for input(s): INR in the last 168 hours.     Component Value Date/Time   COLORURINE YELLOW 04/30/2014 [place] 04/30/2014 1053   LABSPEC 1.020 04/30/2014 1053   PHURINE 5.5 04/30/2014 1053   GLUCOSEU NEGATIVE 04/30/2014 1053   HGBUR NEGATIVE 04/30/2014 1053   BILIRUBINUR NEGATIVE 04/30/2014 1053   BILIRUBINUR neg 04/30/2014 1042   KETONESUR NEGATIVE 04/30/2014 1053   PROTEINUR 30 04/30/2014 1042   UROBILINOGEN 0.2 04/30/2014 1053   UROBILINOGEN 0.2 04/30/2014 1042   NITRITE NEGATIVE 04/30/2014 1053   NITRITE neg 04/30/2014 1042   LEUKOCYTESUR TRACE* 04/30/2014 1053    STUDIES: No results found.  ASSESSMENT:  Hypomagnesemia Anemia  PLAN:   1. Hypomagnesemia. The patient's magnesium level today is 2.3, she is currently taking magnesium oxide 400 mg by mouth twice a day. Other than diarrhea patient appears to be tolerating very well, instructed patient to decrease dose to once per day to see if there is improvement in diarrhea. Will recheck patient's labs in 6 and 12 weeks. 2. Anemia. Patient's hemoglobin is stable at 11.4 today, anemia was new finding in June 2015. Colonoscopy was performed in November 2015 and reported as normal. We'll continue with routine follow-up with me provider in 12 weeks with labs in 6 weeks.  Patient expressed understanding and was in agreement with this plan. She also understands that She can call clinic at any time with any questions, concerns, or complaints.    Dr. Grayland Ormond was available for consultation and review of plan of care for this patient.   Evlyn Kanner, NP   04/23/2015 4:33 PM

## 2015-04-23 NOTE — Progress Notes (Signed)
Patient has questions about changing magnesium.  Currently on MagOx but having problems with diarrhea >8 BM's per day. Patient also c/o dry mouth.

## 2015-04-30 DIAGNOSIS — L578 Other skin changes due to chronic exposure to nonionizing radiation: Secondary | ICD-10-CM | POA: Diagnosis not present

## 2015-04-30 DIAGNOSIS — D0462 Carcinoma in situ of skin of left upper limb, including shoulder: Secondary | ICD-10-CM | POA: Diagnosis not present

## 2015-04-30 DIAGNOSIS — D485 Neoplasm of uncertain behavior of skin: Secondary | ICD-10-CM | POA: Diagnosis not present

## 2015-04-30 DIAGNOSIS — D0471 Carcinoma in situ of skin of right lower limb, including hip: Secondary | ICD-10-CM | POA: Diagnosis not present

## 2015-05-13 DIAGNOSIS — D0462 Carcinoma in situ of skin of left upper limb, including shoulder: Secondary | ICD-10-CM | POA: Diagnosis not present

## 2015-05-17 DIAGNOSIS — H2513 Age-related nuclear cataract, bilateral: Secondary | ICD-10-CM | POA: Diagnosis not present

## 2015-05-22 ENCOUNTER — Other Ambulatory Visit: Payer: Self-pay | Admitting: Internal Medicine

## 2015-05-25 ENCOUNTER — Telehealth: Payer: Self-pay

## 2015-05-25 NOTE — Telephone Encounter (Signed)
The pt is hoping to speak with Juliann Pulse when she can. She did not specifically state why.

## 2015-05-25 NOTE — Telephone Encounter (Signed)
Yes she can have her mg drawn here

## 2015-05-25 NOTE — Telephone Encounter (Signed)
Patient still seeing cancer center for Magnesium draws is it possible she can have the draws here now?

## 2015-05-26 NOTE — Telephone Encounter (Signed)
Spoke with pt, advised Dr Derrel Nip said she could have her Magnesium drawn here.  Pt will verify the time she is to have it drawn and call back to schedule.

## 2015-06-04 ENCOUNTER — Telehealth: Payer: Self-pay | Admitting: *Deleted

## 2015-06-04 ENCOUNTER — Inpatient Hospital Stay: Payer: Medicare Other | Attending: Family Medicine

## 2015-06-04 DIAGNOSIS — D649 Anemia, unspecified: Secondary | ICD-10-CM | POA: Insufficient documentation

## 2015-06-04 DIAGNOSIS — Z79899 Other long term (current) drug therapy: Secondary | ICD-10-CM | POA: Diagnosis not present

## 2015-06-04 DIAGNOSIS — D508 Other iron deficiency anemias: Secondary | ICD-10-CM

## 2015-06-04 LAB — CBC WITH DIFFERENTIAL/PLATELET
Basophils Absolute: 0 10*3/uL (ref 0–0.1)
Basophils Relative: 1 %
Eosinophils Absolute: 0.2 10*3/uL (ref 0–0.7)
Eosinophils Relative: 4 %
HCT: 32.1 % — ABNORMAL LOW (ref 35.0–47.0)
Hemoglobin: 10.6 g/dL — ABNORMAL LOW (ref 12.0–16.0)
LYMPHS PCT: 34 %
Lymphs Abs: 1.6 10*3/uL (ref 1.0–3.6)
MCH: 27.9 pg (ref 26.0–34.0)
MCHC: 33.2 g/dL (ref 32.0–36.0)
MCV: 84.3 fL (ref 80.0–100.0)
MONOS PCT: 7 %
Monocytes Absolute: 0.3 10*3/uL (ref 0.2–0.9)
NEUTROS ABS: 2.5 10*3/uL (ref 1.4–6.5)
Neutrophils Relative %: 54 %
PLATELETS: 214 10*3/uL (ref 150–440)
RBC: 3.81 MIL/uL (ref 3.80–5.20)
RDW: 14.4 % (ref 11.5–14.5)
WBC: 4.6 10*3/uL (ref 3.6–11.0)

## 2015-06-04 LAB — BASIC METABOLIC PANEL
ANION GAP: 6 (ref 5–15)
BUN: 35 mg/dL — AB (ref 6–20)
CO2: 26 mmol/L (ref 22–32)
Calcium: 8.9 mg/dL (ref 8.9–10.3)
Chloride: 106 mmol/L (ref 101–111)
Creatinine, Ser: 1.34 mg/dL — ABNORMAL HIGH (ref 0.44–1.00)
GFR calc Af Amer: 44 mL/min — ABNORMAL LOW (ref >60)
GFR calc non Af Amer: 38 mL/min — ABNORMAL LOW (ref >60)
Glucose, Bld: 159 mg/dL — ABNORMAL HIGH (ref 65–99)
POTASSIUM: 4.3 mmol/L (ref 3.5–5.1)
SODIUM: 138 mmol/L (ref 135–145)

## 2015-06-04 LAB — FERRITIN: Ferritin: 98 ng/mL (ref 11–307)

## 2015-06-04 LAB — IRON AND TIBC
Iron: 70 ug/dL (ref 28–170)
SATURATION RATIOS: 21 % (ref 10.4–31.8)
TIBC: 333 ug/dL (ref 250–450)
UIBC: 263 ug/dL

## 2015-06-04 LAB — MAGNESIUM: MAGNESIUM: 2 mg/dL (ref 1.7–2.4)

## 2015-06-04 NOTE — Telephone Encounter (Signed)
Pt wanted to wait for lab results. Explained cbc per pt request.  hgb lower than it was last time now 10.6. Pt has been more tired lately.  Explained to her that she may need to make a list of tasks and take breaks in between each one.  Also she is trying to mow her 1 acre yard.  I have advised her that she do in the best of temp. Early morning and late evening and take several breaks and drink water and she has already spoke to someone to mow and weed eat her grass.  She also asked for mg. Level and it is 2.0 and I spoke to [place] on call and he said no changes in [place]. Dose of once a day at this time.  Patient did want me to tell him that just a tiny dec. In level sometimes makes her have odd experiences at dinner time that she feels like she is going to black out and she never does and it goes away quickly and she has determined it is from [place]. Level dropping.  I did give this info to him and at this time no change in dose.  She would also like to get labs done at [facility] office because they do not charge as much as here.  I told her the NP is not here this week and will rtn next week and I will check with her then and get back to pt with response and she was agreeable to this plan. I have sent in basket message to NP for when she rtns

## 2015-06-08 DIAGNOSIS — H3531 Nonexudative age-related macular degeneration: Secondary | ICD-10-CM | POA: Diagnosis not present

## 2015-06-08 DIAGNOSIS — H2513 Age-related nuclear cataract, bilateral: Secondary | ICD-10-CM | POA: Diagnosis not present

## 2015-06-25 ENCOUNTER — Other Ambulatory Visit: Payer: Self-pay | Admitting: Internal Medicine

## 2015-06-25 NOTE — Telephone Encounter (Signed)
Last OV 3.22.16, last refill 5.19.16.  Please advise refill

## 2015-06-28 NOTE — Telephone Encounter (Signed)
Ok to refill,  Refill sent  

## 2015-07-21 ENCOUNTER — Other Ambulatory Visit: Payer: Medicare Other

## 2015-07-21 ENCOUNTER — Ambulatory Visit: Payer: Medicare Other

## 2015-07-27 DIAGNOSIS — I10 Essential (primary) hypertension: Secondary | ICD-10-CM | POA: Diagnosis not present

## 2015-07-27 DIAGNOSIS — E785 Hyperlipidemia, unspecified: Secondary | ICD-10-CM | POA: Diagnosis not present

## 2015-07-27 DIAGNOSIS — I714 Abdominal aortic aneurysm, without rupture: Secondary | ICD-10-CM | POA: Diagnosis not present

## 2015-08-06 ENCOUNTER — Inpatient Hospital Stay: Payer: Medicare Other | Admitting: Family Medicine

## 2015-08-06 ENCOUNTER — Inpatient Hospital Stay: Payer: Medicare Other

## 2015-08-06 ENCOUNTER — Other Ambulatory Visit: Payer: Self-pay | Admitting: *Deleted

## 2015-08-06 DIAGNOSIS — D508 Other iron deficiency anemias: Secondary | ICD-10-CM

## 2015-08-09 ENCOUNTER — Inpatient Hospital Stay: Payer: Medicare Other | Attending: Family Medicine

## 2015-08-09 DIAGNOSIS — Z79899 Other long term (current) drug therapy: Secondary | ICD-10-CM | POA: Insufficient documentation

## 2015-08-09 DIAGNOSIS — K210 Gastro-esophageal reflux disease with esophagitis: Secondary | ICD-10-CM | POA: Insufficient documentation

## 2015-08-09 DIAGNOSIS — E538 Deficiency of other specified B group vitamins: Secondary | ICD-10-CM | POA: Diagnosis not present

## 2015-08-09 DIAGNOSIS — E785 Hyperlipidemia, unspecified: Secondary | ICD-10-CM | POA: Insufficient documentation

## 2015-08-09 DIAGNOSIS — K589 Irritable bowel syndrome without diarrhea: Secondary | ICD-10-CM | POA: Insufficient documentation

## 2015-08-09 DIAGNOSIS — Z87891 Personal history of nicotine dependence: Secondary | ICD-10-CM | POA: Diagnosis not present

## 2015-08-09 DIAGNOSIS — I739 Peripheral vascular disease, unspecified: Secondary | ICD-10-CM | POA: Insufficient documentation

## 2015-08-09 DIAGNOSIS — Z23 Encounter for immunization: Secondary | ICD-10-CM | POA: Diagnosis not present

## 2015-08-09 DIAGNOSIS — D508 Other iron deficiency anemias: Secondary | ICD-10-CM

## 2015-08-09 DIAGNOSIS — Z7982 Long term (current) use of aspirin: Secondary | ICD-10-CM | POA: Insufficient documentation

## 2015-08-09 DIAGNOSIS — D649 Anemia, unspecified: Secondary | ICD-10-CM | POA: Insufficient documentation

## 2015-08-09 LAB — CBC WITH DIFFERENTIAL/PLATELET
BASOS ABS: 0 10*3/uL (ref 0–0.1)
BASOS PCT: 0 %
EOS ABS: 0.1 10*3/uL (ref 0–0.7)
Eosinophils Relative: 3 %
HCT: 32.8 % — ABNORMAL LOW (ref 35.0–47.0)
HEMOGLOBIN: 11 g/dL — AB (ref 12.0–16.0)
Lymphocytes Relative: 32 %
Lymphs Abs: 1.4 10*3/uL (ref 1.0–3.6)
MCH: 28.3 pg (ref 26.0–34.0)
MCHC: 33.4 g/dL (ref 32.0–36.0)
MCV: 84.6 fL (ref 80.0–100.0)
Monocytes Absolute: 0.4 10*3/uL (ref 0.2–0.9)
Monocytes Relative: 9 %
NEUTROS PCT: 56 %
Neutro Abs: 2.4 10*3/uL (ref 1.4–6.5)
Platelets: 207 10*3/uL (ref 150–440)
RBC: 3.88 MIL/uL (ref 3.80–5.20)
RDW: 14.4 % (ref 11.5–14.5)
WBC: 4.3 10*3/uL (ref 3.6–11.0)

## 2015-08-09 LAB — COMPREHENSIVE METABOLIC PANEL
ALBUMIN: 4.3 g/dL (ref 3.5–5.0)
ALK PHOS: 69 U/L (ref 38–126)
ALT: 22 U/L (ref 14–54)
ANION GAP: 7 (ref 5–15)
AST: 35 U/L (ref 15–41)
BUN: 26 mg/dL — ABNORMAL HIGH (ref 6–20)
CALCIUM: 8.7 mg/dL — AB (ref 8.9–10.3)
CO2: 27 mmol/L (ref 22–32)
Chloride: 103 mmol/L (ref 101–111)
Creatinine, Ser: 1.11 mg/dL — ABNORMAL HIGH (ref 0.44–1.00)
GFR calc Af Amer: 56 mL/min — ABNORMAL LOW (ref >60)
GFR calc non Af Amer: 48 mL/min — ABNORMAL LOW (ref >60)
GLUCOSE: 122 mg/dL — AB (ref 65–99)
Potassium: 4.7 mmol/L (ref 3.5–5.1)
SODIUM: 137 mmol/L (ref 135–145)
Total Bilirubin: 0.6 mg/dL (ref 0.3–1.2)
Total Protein: 7.2 g/dL (ref 6.5–8.1)

## 2015-08-09 LAB — MAGNESIUM: MAGNESIUM: 2.2 mg/dL (ref 1.7–2.4)

## 2015-08-10 ENCOUNTER — Other Ambulatory Visit: Payer: Self-pay | Admitting: Family Medicine

## 2015-08-10 MED ORDER — HEPARIN SOD (PORK) LOCK FLUSH 100 UNIT/ML IV SOLN
INTRAVENOUS | Status: AC
  Filled 2015-08-10: qty 5

## 2015-08-13 ENCOUNTER — Other Ambulatory Visit: Payer: Self-pay | Admitting: Internal Medicine

## 2015-08-13 ENCOUNTER — Inpatient Hospital Stay (HOSPITAL_BASED_OUTPATIENT_CLINIC_OR_DEPARTMENT_OTHER): Payer: Medicare Other | Admitting: Family Medicine

## 2015-08-13 ENCOUNTER — Other Ambulatory Visit: Payer: Medicare Other

## 2015-08-13 VITALS — BP 132/72 | HR 65 | Temp 95.4°F | Resp 18 | Ht 65.0 in | Wt 161.4 lb

## 2015-08-13 DIAGNOSIS — Z79899 Other long term (current) drug therapy: Secondary | ICD-10-CM | POA: Diagnosis not present

## 2015-08-13 DIAGNOSIS — Z23 Encounter for immunization: Secondary | ICD-10-CM

## 2015-08-13 DIAGNOSIS — E538 Deficiency of other specified B group vitamins: Secondary | ICD-10-CM

## 2015-08-13 DIAGNOSIS — I739 Peripheral vascular disease, unspecified: Secondary | ICD-10-CM | POA: Diagnosis not present

## 2015-08-13 DIAGNOSIS — D649 Anemia, unspecified: Secondary | ICD-10-CM

## 2015-08-13 MED ORDER — INFLUENZA VAC SPLIT QUAD 0.5 ML IM SUSY
0.5000 mL | PREFILLED_SYRINGE | Freq: Once | INTRAMUSCULAR | Status: AC
  Administered 2015-08-13: 0.5 mL via INTRAMUSCULAR

## 2015-08-13 NOTE — Progress Notes (Signed)
Pt has new onset dizziness.  She is also having a lot of fatigue and falling asleep every time she sits down.

## 2015-08-13 NOTE — Progress Notes (Signed)
[place]  [phone]  [phone]     Sabrina Montoya DOB: 23-May-1941  MR#: [id number]  YIF#:[id number]  Patient Care Team: Crecencio Mc, MD as PCP - General (Internal Medicine) Crecencio Mc, MD (Internal Medicine) Robert Bellow, MD (General Surgery)  CHIEF COMPLAINT:  Chief Complaint  Patient presents with  . Follow-up    anemia   History of long-standing hypomagnesemia, anemia.  INTERVAL HISTORY:  Patient is here for continued follow-up Re: Hypomagnesemia. She was previously unable to maintain magnesium level oral magnesium. Has received IV magnesium infusions multiple times in past. Overall she reports feeling well today other than diarrhea. Patient has been taking oral magnesium oxide 400 mg 1 tablet by mouth daily. Reports that when she started magnesium oxide is when her diarrhea started but when her dose was decreased to once daily her diarrhea improved greatly. She otherwise feels well and denies any other complaints.  REVIEW OF SYSTEMS:   Review of Systems  Constitutional: Negative for fever, chills, weight loss, malaise/fatigue and diaphoresis.  HENT: Negative for congestion, ear discharge, ear pain, hearing loss, nosebleeds, sore throat and tinnitus.   Eyes: Negative for blurred vision, double vision, photophobia, pain, discharge and redness.  Respiratory: Negative for cough, hemoptysis, sputum production, shortness of breath, wheezing and stridor.   Cardiovascular: Negative for chest pain, palpitations, orthopnea, claudication, leg swelling and PND.  Gastrointestinal: Negative for heartburn, nausea, vomiting, abdominal pain, diarrhea, constipation, blood in stool and melena.  Genitourinary: Negative.   Musculoskeletal: Negative.   Skin: Negative.   Neurological: Negative for dizziness, tingling, focal weakness, seizures, weakness and headaches.  Endo/Heme/Allergies: Does not bruise/bleed easily.  Psychiatric/Behavioral: Negative  for depression. The patient is not nervous/anxious and does not have insomnia.     As per HPI. Otherwise, a complete review of systems is negatve.   PAST MEDICAL HISTORY: Past Medical History  Diagnosis Date  . Diverticulitis of colon   . Rheumatic fever     possible at age 26  . Tobacco abuse   . Hyperlipidemia   . Abdominal aortic aneurysm without mention of rupture     infrarenal, stable, folllowed by Leotis Pain  . CAD (coronary artery disease), autologous vein bypass graft   . Peripheral vascular disease     s/p CEA   . Tobacco abuse   . B12 deficiency   . Acute posthemorrhagic anemia   . Cardiac dysrhythmia, unspecified   . Neuropathy   . Reflux esophagitis   . Sliding hiatal hernia   . GERD (gastroesophageal reflux disease)   . Colon polyp   . IBS (irritable bowel syndrome)     PAST SURGICAL HISTORY: Past Surgical History  Procedure Laterality Date  . Closed manipulation shoulder      left shoulder post physical therapy, Right shoulder redo (toggle bolts)  . Rotator cuff repair  2002    right shoulder Dr. Nadean Corwin, [place] in [place]      Carotid stenosis found during workup for dysphagia,  [facility]  . Lumbar disc surgery  1971    L5, unssuccessful, fusion in 1983 successful (2 lumbar)  . Abdominal surgery  1976    for pain secondary to scar tissue, s/p apply  . 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 Hernia repair  may 2011    Dr. Pat Patrick  . Coronary artery bypass graft  2006    s/p triple  bypass surgery, [facility]  . Cholecystectomy  2002    Dr. Pat Patrick  . Coronary angiography  01/2011    one occluded artery with collateralization, other arteries patent  . Hernia repair    . Appendectomy  1974  . Colonoscopy  09/01/14    FAMILY HISTORY Family History  Problem Relation Age of Onset  . Cancer Sister     cervical cancer  . Diabetes Sister   . Heart disease Brother     coronary  artery disease  . Other Brother     suicide  . Diabetes Mother   . Heart disease Mother   . Diabetes Sister   . Other Father     suicide  . Cancer Sister     breast  . Other Other     colon resection due to inflammation -nephew    GYNECOLOGIC HISTORY:  No LMP recorded. Patient is postmenopausal.     ADVANCED DIRECTIVES:    HEALTH MAINTENANCE: Social History  Substance Use Topics  . Smoking status: Former Smoker -- 0.50 packs/day for 30 years    Types: Cigarettes    Quit date: 04/01/2012  . Smokeless tobacco: Never Used     Comment: quit for 2 years after sinus infection and 6 months after heart surgery  . Alcohol Use: No     Colonoscopy:  PAP:  Bone density:  Lipid panel:  Allergies  Allergen Reactions  . Niacin And Related Hives  . Sulfa Drugs Cross Reactors Nausea Only  . Tetracyclines & Related Hives  . Latex Rash  . Simvastatin Rash    *Antihyperlipidemics*; elevated LFT's.     Current Outpatient Prescriptions  Medication Sig Dispense Refill  . albuterol (PROVENTIL HFA;VENTOLIN HFA) 108 (90 BASE) MCG/ACT inhaler Inhale 2 puffs into the lungs every 6 (six) hours as needed for wheezing or shortness of breath.    Marland Kitchen aspirin 81 MG tablet Take 81 mg by mouth daily.    Marland Kitchen buPROPion (WELLBUTRIN XL) 150 MG 24 hr tablet TAKE 1 TABLET BY MOUTH EACH MORNING. 30 tablet 3  . busPIRone (BUSPAR) 7.5 MG tablet TAKE 1 TABLET BY MOUTH TWO TO THREE TIMES A DAY 90 tablet 5  . cholecalciferol (VITAMIN D) 1000 UNITS tablet Take 1,000 Units by mouth daily.    . clonazePAM (KLONOPIN) 0.5 MG tablet TAKE 1 TABLET BY MOUTH TWICE DAILY AS NEEDED. 60 tablet 3  . conjugated estrogens (PREMARIN) vaginal cream Place 1 Applicatorful vaginally at bedtime. 42.5 g 12  . CRESTOR 40 MG tablet TAKE 1 TABLET BY MOUTH ONCE DAILY 90 tablet 1  . dicyclomine (BENTYL) 20 MG tablet Take 20 mg by mouth 2 (two) times daily.    . famotidine (PEPCID) 20 MG tablet TAKE 1 TABLET BY MOUTH TWICE DAILY BEFORE  BREAKFAST AND DINNER 60 tablet 5  . HYDROcodone-acetaminophen (NORCO/VICODIN) 5-325 MG per tablet Take 1 tablet by mouth every 6 (six) hours as needed for moderate pain. 60 tablet 0  . magnesium oxide (MAG-OX) 400 (241.3 MG) MG tablet TAKE ONE TABLET BY MOUTH TWICE DAILY (Patient taking differently: take one tablet daily) 60 tablet 5  . metoprolol tartrate (LOPRESSOR) 25 MG tablet TAKE 1/2 TABLET BY MOUTH TWICE DAILY 30 tablet 6  . nitroGLYCERIN (NITROSTAT) 0.4 MG SL tablet Place 1 tablet (0.4 mg total) under the tongue every 5 (five) minutes as needed. As needed for chest pain 10 tablet 1  . omeprazole (PRILOSEC) 20 MG capsule TAKE 1 CAPSULE BY MOUTH ONCE DAILY. 30 capsule  5  . polyethylene glycol powder (GLYCOLAX/MIRALAX) powder 255 grams one bottle for colonoscopy prep 255 g 0  . polyethylene glycol powder (GLYCOLAX/MIRALAX) powder Take 17 g by mouth daily. 3350 g 1   No current facility-administered medications for this visit.    OBJECTIVE: BP 132/72 mmHg  Pulse 65  Temp(Src) 95.4 F (35.2 C) (Tympanic)  Resp 18  Ht 5\' 5"  (1.651 m)  Wt 161 lb 6.4 oz (73.211 kg)  BMI 26.86 kg/m2   Body mass index is 26.86 kg/(m^2).    ECOG FS:0 - Asymptomatic  General: Well-developed, well-nourished, no acute distress. Eyes: Pink conjunctiva, anicteric sclera. HEENT: Normocephalic, moist mucous membranes, clear oropharnyx. Lungs: Clear to auscultation bilaterally. Heart: Regular rate and rhythm. No rubs, murmurs, or gallops. Abdomen: Soft, nontender, nondistended. No organomegaly noted, normoactive bowel sounds. Musculoskeletal: No edema, cyanosis, or clubbing. Neuro: Alert, answering all questions appropriately. Cranial nerves grossly intact. Skin: No rashes or petechiae noted. Psych: Normal affect. Lymphatics: No cervical, calvicular, axillary or inguinal LAD.   LAB RESULTS:     Component Value Date/Time   NA 137 08/09/2015 1221   NA 142 05/25/2014 1059   K 4.7 08/09/2015 1221   K 4.0  12/09/2014 0951   CL 103 08/09/2015 1221   CL 106 05/25/2014 1059   CO2 27 08/09/2015 1221   CO2 29 05/25/2014 1059   GLUCOSE 122* 08/09/2015 1221   GLUCOSE 110* 05/25/2014 1059   BUN 26* 08/09/2015 1221   BUN 26* 05/25/2014 1059   CREATININE 1.11* 08/09/2015 1221   CREATININE 1.16 12/09/2014 0951   CALCIUM 8.7* 08/09/2015 1221   CALCIUM 9.1 05/25/2014 1059   PROT 7.2 08/09/2015 1221   PROT 6.3* 05/06/2012 1924   ALBUMIN 4.3 08/09/2015 1221   ALBUMIN 3.5 05/06/2012 1924   AST 35 08/09/2015 1221   AST 41* 05/06/2012 1924   ALT 22 08/09/2015 1221   ALT 39 05/06/2012 1924   ALKPHOS 69 08/09/2015 1221   ALKPHOS 66 05/06/2012 1924   BILITOT 0.6 08/09/2015 1221   BILITOT 0.2 05/06/2012 1924   GFRNONAA 48* 08/09/2015 1221   GFRNONAA 49* 12/09/2014 0951   GFRNONAA 39* 07/29/2014 0917   GFRAA 56* 08/09/2015 1221   GFRAA 59* 12/09/2014 0951   GFRAA 46* 07/29/2014 0917    No results found for: SPEP, UPEP  Lab Results  Component Value Date   WBC 4.3 08/09/2015   NEUTROABS 2.4 08/09/2015   HGB 11.0* 08/09/2015   HCT 32.8* 08/09/2015   MCV 84.6 08/09/2015   PLT 207 08/09/2015      Chemistry      Component Value Date/Time   NA 137 08/09/2015 1221   NA 142 05/25/2014 1059   K 4.7 08/09/2015 1221   K 4.0 12/09/2014 0951   CL 103 08/09/2015 1221   CL 106 05/25/2014 1059   CO2 27 08/09/2015 1221   CO2 29 05/25/2014 1059   BUN 26* 08/09/2015 1221   BUN 26* 05/25/2014 1059   CREATININE 1.11* 08/09/2015 1221   CREATININE 1.16 12/09/2014 0951      Component Value Date/Time   CALCIUM 8.7* 08/09/2015 1221   CALCIUM 9.1 05/25/2014 1059   ALKPHOS 69 08/09/2015 1221   ALKPHOS 66 05/06/2012 1924   AST 35 08/09/2015 1221   AST 41* 05/06/2012 1924   ALT 22 08/09/2015 1221   ALT 39 05/06/2012 1924   BILITOT 0.6 08/09/2015 1221   BILITOT 0.2 05/06/2012 1924       No results found for: LABCA2  No components found for: LABCA125  No results for input(s): INR in the last  168 hours.     Component Value Date/Time   COLORURINE YELLOW 04/30/2014 1053   COLORURINE Yellow 04/30/2012 1937   APPEARANCEUR CLEAR 04/30/2014 [place] 04/30/2012 1937   LABSPEC 1.020 04/30/2014 1053   LABSPEC 1.020 04/30/2012 1937   PHURINE 5.5 04/30/2014 1053   PHURINE 5.0 04/30/2012 1937   GLUCOSEU NEGATIVE 04/30/2014 1053   GLUCOSEU Negative 04/30/2012 1937   HGBUR NEGATIVE 04/30/2014 1053   HGBUR Negative 04/30/2012 1937   BILIRUBINUR NEGATIVE 04/30/2014 1053   BILIRUBINUR neg 04/30/2014 1042   BILIRUBINUR Negative 04/30/2012 [place] 04/30/2014 1053   KETONESUR Negative 04/30/2012 1937   PROTEINUR 30 04/30/2014 1042   PROTEINUR Negative 04/30/2012 1937   UROBILINOGEN 0.2 04/30/2014 1053   UROBILINOGEN 0.2 04/30/2014 1042   NITRITE NEGATIVE 04/30/2014 1053   NITRITE neg 04/30/2014 1042   NITRITE Negative 04/30/2012 1937   LEUKOCYTESUR TRACE* 04/30/2014 1053   LEUKOCYTESUR Trace 04/30/2012 1937    STUDIES: No results found.  ASSESSMENT:  Hypomagnesemia Anemia  PLAN:   1. Hypomagnesemia. The patient's magnesium level today is 2.2 she is currently taking magnesium oxide 400 mg by mouth once a day. Diarrhea has improved greatly, appears to be tolerating well.  2. Anemia. Patient's hemoglobin is stable at 11.0 today, anemia was new finding in June 2015. Colonoscopy was performed in November 2015 and reported as normal. We'll continue with routine follow-up with me provider in 6 months with labs every 2 months.  Patient expressed understanding and was in agreement with this plan. She also understands that She can call clinic at any time with any questions, concerns, or complaints.    Dr. Rogue Bussing was available for consultation and review of plan of care for this patient.   Evlyn Kanner, NP   08/13/2015 4:34 PM

## 2015-08-16 ENCOUNTER — Other Ambulatory Visit: Payer: Self-pay | Admitting: Family Medicine

## 2015-08-16 NOTE — Telephone Encounter (Signed)
Ok to refill,  printed rx  

## 2015-08-16 NOTE — Telephone Encounter (Signed)
Please advise refill? 

## 2015-09-10 ENCOUNTER — Other Ambulatory Visit: Payer: Self-pay | Admitting: Internal Medicine

## 2015-09-10 NOTE — Telephone Encounter (Signed)
Please advise refill? 

## 2015-09-14 DIAGNOSIS — H9209 Otalgia, unspecified ear: Secondary | ICD-10-CM | POA: Diagnosis not present

## 2015-09-14 DIAGNOSIS — J0100 Acute maxillary sinusitis, unspecified: Secondary | ICD-10-CM | POA: Diagnosis not present

## 2015-09-14 DIAGNOSIS — M26622 Arthralgia of left temporomandibular joint: Secondary | ICD-10-CM | POA: Diagnosis not present

## 2015-09-23 DIAGNOSIS — B379 Candidiasis, unspecified: Secondary | ICD-10-CM | POA: Diagnosis not present

## 2015-09-23 DIAGNOSIS — J0100 Acute maxillary sinusitis, unspecified: Secondary | ICD-10-CM | POA: Diagnosis not present

## 2015-10-13 ENCOUNTER — Inpatient Hospital Stay: Payer: Medicare Other

## 2015-10-13 ENCOUNTER — Inpatient Hospital Stay: Payer: Medicare Other | Attending: Family Medicine

## 2015-10-13 DIAGNOSIS — E538 Deficiency of other specified B group vitamins: Secondary | ICD-10-CM | POA: Insufficient documentation

## 2015-10-13 DIAGNOSIS — Z79899 Other long term (current) drug therapy: Secondary | ICD-10-CM | POA: Insufficient documentation

## 2015-10-13 DIAGNOSIS — D649 Anemia, unspecified: Secondary | ICD-10-CM | POA: Insufficient documentation

## 2015-10-13 LAB — CBC WITH DIFFERENTIAL/PLATELET
Basophils Absolute: 0 10*3/uL (ref 0–0.1)
Basophils Relative: 1 %
Eosinophils Absolute: 0.1 10*3/uL (ref 0–0.7)
Eosinophils Relative: 3 %
HEMATOCRIT: 31.7 % — AB (ref 35.0–47.0)
HEMOGLOBIN: 10.8 g/dL — AB (ref 12.0–16.0)
LYMPHS ABS: 1.7 10*3/uL (ref 1.0–3.6)
LYMPHS PCT: 40 %
MCH: 28.5 pg (ref 26.0–34.0)
MCHC: 34 g/dL (ref 32.0–36.0)
MCV: 83.9 fL (ref 80.0–100.0)
MONOS PCT: 11 %
Monocytes Absolute: 0.5 10*3/uL (ref 0.2–0.9)
NEUTROS ABS: 1.8 10*3/uL (ref 1.4–6.5)
NEUTROS PCT: 45 %
PLATELETS: 208 10*3/uL (ref 150–440)
RBC: 3.78 MIL/uL — AB (ref 3.80–5.20)
RDW: 14.4 % (ref 11.5–14.5)
WBC: 4.1 10*3/uL (ref 3.6–11.0)

## 2015-10-13 LAB — MAGNESIUM: MAGNESIUM: 1.8 mg/dL (ref 1.7–2.4)

## 2015-10-14 ENCOUNTER — Other Ambulatory Visit: Payer: Self-pay | Admitting: Internal Medicine

## 2015-10-21 ENCOUNTER — Other Ambulatory Visit: Payer: Self-pay | Admitting: Family Medicine

## 2015-10-27 DIAGNOSIS — I10 Essential (primary) hypertension: Secondary | ICD-10-CM | POA: Diagnosis not present

## 2015-10-27 DIAGNOSIS — I209 Angina pectoris, unspecified: Secondary | ICD-10-CM | POA: Diagnosis not present

## 2015-10-27 DIAGNOSIS — I714 Abdominal aortic aneurysm, without rupture: Secondary | ICD-10-CM | POA: Diagnosis not present

## 2015-10-27 DIAGNOSIS — I6523 Occlusion and stenosis of bilateral carotid arteries: Secondary | ICD-10-CM | POA: Diagnosis not present

## 2015-10-27 DIAGNOSIS — I25119 Atherosclerotic heart disease of native coronary artery with unspecified angina pectoris: Secondary | ICD-10-CM | POA: Diagnosis not present

## 2015-10-27 DIAGNOSIS — F17200 Nicotine dependence, unspecified, uncomplicated: Secondary | ICD-10-CM | POA: Diagnosis not present

## 2015-11-03 ENCOUNTER — Encounter: Payer: Self-pay | Admitting: *Deleted

## 2015-11-04 ENCOUNTER — Telehealth: Payer: Self-pay | Admitting: Family Medicine

## 2015-11-04 NOTE — Telephone Encounter (Signed)
Patient had an additional lab appointment added for January and wants to know if she should keep that one and the one in February or if anything needs to be changed. Thanks.

## 2015-11-05 NOTE — Telephone Encounter (Signed)
Left msg for patient. She needs to keep all scheduled appts as directed by NP.

## 2015-11-11 ENCOUNTER — Other Ambulatory Visit: Payer: Self-pay | Admitting: Internal Medicine

## 2015-11-11 DIAGNOSIS — D0439 Carcinoma in situ of skin of other parts of face: Secondary | ICD-10-CM | POA: Diagnosis not present

## 2015-11-11 DIAGNOSIS — Z85828 Personal history of other malignant neoplasm of skin: Secondary | ICD-10-CM | POA: Diagnosis not present

## 2015-11-11 DIAGNOSIS — D1801 Hemangioma of skin and subcutaneous tissue: Secondary | ICD-10-CM | POA: Diagnosis not present

## 2015-11-11 DIAGNOSIS — L821 Other seborrheic keratosis: Secondary | ICD-10-CM | POA: Diagnosis not present

## 2015-11-11 DIAGNOSIS — X32XXXA Exposure to sunlight, initial encounter: Secondary | ICD-10-CM | POA: Diagnosis not present

## 2015-11-11 DIAGNOSIS — L570 Actinic keratosis: Secondary | ICD-10-CM | POA: Diagnosis not present

## 2015-11-11 DIAGNOSIS — D485 Neoplasm of uncertain behavior of skin: Secondary | ICD-10-CM | POA: Diagnosis not present

## 2015-11-12 NOTE — Telephone Encounter (Signed)
Ok to refill,  Refill sent .  Needs ov

## 2015-11-12 NOTE — Telephone Encounter (Signed)
Please advise refill, last OV was in March of 2016

## 2015-11-18 ENCOUNTER — Inpatient Hospital Stay: Payer: Medicare Other | Attending: Internal Medicine

## 2015-11-18 DIAGNOSIS — I25119 Atherosclerotic heart disease of native coronary artery with unspecified angina pectoris: Secondary | ICD-10-CM | POA: Diagnosis not present

## 2015-11-18 DIAGNOSIS — I25710 Atherosclerosis of autologous vein coronary artery bypass graft(s) with unstable angina pectoris: Secondary | ICD-10-CM | POA: Diagnosis not present

## 2015-11-18 DIAGNOSIS — Z791 Long term (current) use of non-steroidal anti-inflammatories (NSAID): Secondary | ICD-10-CM | POA: Diagnosis not present

## 2015-11-18 DIAGNOSIS — I10 Essential (primary) hypertension: Secondary | ICD-10-CM | POA: Diagnosis not present

## 2015-11-18 DIAGNOSIS — Z79899 Other long term (current) drug therapy: Secondary | ICD-10-CM | POA: Diagnosis not present

## 2015-11-18 DIAGNOSIS — I25110 Atherosclerotic heart disease of native coronary artery with unstable angina pectoris: Secondary | ICD-10-CM | POA: Diagnosis not present

## 2015-11-18 DIAGNOSIS — I6523 Occlusion and stenosis of bilateral carotid arteries: Secondary | ICD-10-CM | POA: Diagnosis not present

## 2015-11-18 DIAGNOSIS — Z7982 Long term (current) use of aspirin: Secondary | ICD-10-CM | POA: Diagnosis not present

## 2015-11-18 DIAGNOSIS — Z951 Presence of aortocoronary bypass graft: Secondary | ICD-10-CM | POA: Diagnosis not present

## 2015-11-18 DIAGNOSIS — F17200 Nicotine dependence, unspecified, uncomplicated: Secondary | ICD-10-CM | POA: Diagnosis not present

## 2015-11-18 DIAGNOSIS — Z881 Allergy status to other antibiotic agents status: Secondary | ICD-10-CM | POA: Diagnosis not present

## 2015-11-18 DIAGNOSIS — Z91040 Latex allergy status: Secondary | ICD-10-CM | POA: Diagnosis not present

## 2015-11-18 DIAGNOSIS — Z888 Allergy status to other drugs, medicaments and biological substances status: Secondary | ICD-10-CM | POA: Diagnosis not present

## 2015-11-26 DIAGNOSIS — I10 Essential (primary) hypertension: Secondary | ICD-10-CM | POA: Diagnosis not present

## 2015-11-26 DIAGNOSIS — I714 Abdominal aortic aneurysm, without rupture: Secondary | ICD-10-CM | POA: Diagnosis not present

## 2015-11-26 DIAGNOSIS — I6523 Occlusion and stenosis of bilateral carotid arteries: Secondary | ICD-10-CM | POA: Diagnosis not present

## 2015-11-26 DIAGNOSIS — E785 Hyperlipidemia, unspecified: Secondary | ICD-10-CM | POA: Diagnosis not present

## 2015-11-27 ENCOUNTER — Other Ambulatory Visit: Payer: Self-pay | Admitting: Internal Medicine

## 2015-12-01 ENCOUNTER — Other Ambulatory Visit: Payer: Self-pay | Admitting: Internal Medicine

## 2015-12-02 DIAGNOSIS — L905 Scar conditions and fibrosis of skin: Secondary | ICD-10-CM | POA: Diagnosis not present

## 2015-12-02 DIAGNOSIS — D0439 Carcinoma in situ of skin of other parts of face: Secondary | ICD-10-CM | POA: Diagnosis not present

## 2015-12-06 DIAGNOSIS — I10 Essential (primary) hypertension: Secondary | ICD-10-CM | POA: Diagnosis not present

## 2015-12-06 DIAGNOSIS — F17200 Nicotine dependence, unspecified, uncomplicated: Secondary | ICD-10-CM | POA: Diagnosis not present

## 2015-12-06 DIAGNOSIS — R002 Palpitations: Secondary | ICD-10-CM | POA: Diagnosis not present

## 2015-12-06 DIAGNOSIS — I25119 Atherosclerotic heart disease of native coronary artery with unspecified angina pectoris: Secondary | ICD-10-CM | POA: Diagnosis not present

## 2015-12-09 DIAGNOSIS — M179 Osteoarthritis of knee, unspecified: Secondary | ICD-10-CM | POA: Diagnosis not present

## 2015-12-09 DIAGNOSIS — M25561 Pain in right knee: Secondary | ICD-10-CM | POA: Diagnosis not present

## 2015-12-09 DIAGNOSIS — R0781 Pleurodynia: Secondary | ICD-10-CM | POA: Diagnosis not present

## 2015-12-15 ENCOUNTER — Inpatient Hospital Stay: Payer: Medicare Other | Attending: Internal Medicine

## 2015-12-15 DIAGNOSIS — D538 Other specified nutritional anemias: Secondary | ICD-10-CM | POA: Insufficient documentation

## 2015-12-15 DIAGNOSIS — Z79899 Other long term (current) drug therapy: Secondary | ICD-10-CM | POA: Diagnosis not present

## 2015-12-15 DIAGNOSIS — D649 Anemia, unspecified: Secondary | ICD-10-CM | POA: Diagnosis not present

## 2015-12-15 LAB — CBC WITH DIFFERENTIAL/PLATELET
BASOS ABS: 0 10*3/uL (ref 0–0.1)
BASOS PCT: 1 %
Eosinophils Absolute: 0.2 10*3/uL (ref 0–0.7)
Eosinophils Relative: 4 %
HEMATOCRIT: 32.2 % — AB (ref 35.0–47.0)
HEMOGLOBIN: 11 g/dL — AB (ref 12.0–16.0)
LYMPHS PCT: 38 %
Lymphs Abs: 1.7 10*3/uL (ref 1.0–3.6)
MCH: 29 pg (ref 26.0–34.0)
MCHC: 34.2 g/dL (ref 32.0–36.0)
MCV: 84.8 fL (ref 80.0–100.0)
Monocytes Absolute: 0.4 10*3/uL (ref 0.2–0.9)
Monocytes Relative: 9 %
NEUTROS ABS: 2.2 10*3/uL (ref 1.4–6.5)
NEUTROS PCT: 48 %
Platelets: 200 10*3/uL (ref 150–440)
RBC: 3.8 MIL/uL (ref 3.80–5.20)
RDW: 14.5 % (ref 11.5–14.5)
WBC: 4.5 10*3/uL (ref 3.6–11.0)

## 2015-12-15 LAB — MAGNESIUM: Magnesium: 2.2 mg/dL (ref 1.7–2.4)

## 2015-12-16 ENCOUNTER — Telehealth: Payer: Self-pay | Admitting: *Deleted

## 2015-12-16 MED ORDER — MAGNESIUM OXIDE 400 (241.3 MG) MG PO TABS: 1.0000 | ORAL_TABLET | Freq: Every day | ORAL | Status: DC

## 2015-12-16 NOTE — Telephone Encounter (Signed)
Can decrease to once a day dosing on Magnesium, pt informed and repeated back to me

## 2015-12-16 NOTE — Telephone Encounter (Signed)
Asking if she should decrease her Mg+ dose to daily since results are >2

## 2015-12-27 DIAGNOSIS — L570 Actinic keratosis: Secondary | ICD-10-CM | POA: Diagnosis not present

## 2015-12-31 ENCOUNTER — Telehealth: Payer: Self-pay | Admitting: Internal Medicine

## 2015-12-31 NOTE — Telephone Encounter (Signed)
Pt called about two medications of buPROPion (WELLBUTRIN XL) 150 MG 24 hr tablet and busPIRone (BUSPAR) 7.5 MG tablet. Pt is worried about busPIRone (BUSPAR) 7.5 MG tablet ,she states because of the buPROPion (WELLBUTRIN XL) 150 MG 24 hr tablet is pulling her down she does not think she needs the Buspar. Pt thinks that the medication is fighting against each other. Pt needs some advise on what to do? Call pt @ [phone]. Thank you!

## 2015-12-31 NOTE — Telephone Encounter (Signed)
Pt called and would like to know when she's due for her AWV? Call pt @ [phone]. Thank you!

## 2015-12-31 NOTE — Telephone Encounter (Signed)
She can stop the buspar but she should reduce it gradually (from three times daily to two times daialy  For a few dys,  Then 1 time daily for a few days,, then stop) She  really needs to make an appointment.  30 minutes needed.  Do not combine with wellness

## 2015-12-31 NOTE — Telephone Encounter (Signed)
You last saw this patient last march, would you like me to schedule a OV to discuss this?

## 2015-12-31 NOTE — Telephone Encounter (Signed)
Left a VM to return my call. 

## 2016-01-03 NOTE — Telephone Encounter (Signed)
Spoke with the patient.  She verbalized understanding for a taper reduction of the medication.

## 2016-01-06 ENCOUNTER — Telehealth: Payer: Self-pay | Admitting: Internal Medicine

## 2016-01-06 NOTE — Telephone Encounter (Signed)
Called pt because appt was cancelled via automated system.. Pt states that she didn't  cancel the appt and needs to be seen asap for her fall that happened a couple wks ago.. Pt states that she just wants somebody to look at her Xrays again.Sabrina Montoya Please advise pt.Sabrina Montoya

## 2016-01-06 NOTE — Telephone Encounter (Signed)
She has not been seen in a year.  She has never had a "wellness visit " because she always has too many chief complaints.  She needs 45 minutes so she cannot be squeezed in.  She will require an 11:00 slot or a 4:00 slot so book whenever this is available.

## 2016-01-06 NOTE — Telephone Encounter (Signed)
Pt coming for recent fall to f/u injuries to right breast, ribs, and knee. Pt is scheduled for medicare wellness on 02/01/16 with Denisa. I expressed that Dr. Derrel Nip will only be following up to injuries from fall, when she comes in for her appt

## 2016-01-06 NOTE — Telephone Encounter (Signed)
Notified pt of test results, pt verbalized understanding. I verified with pt what she is using for injuries from fall. Pt states that she is using tylenol, muscle rub, and warm compress for pain relief. Please advise, thanks

## 2016-01-10 ENCOUNTER — Ambulatory Visit: Payer: Medicare Other | Admitting: Internal Medicine

## 2016-01-26 DIAGNOSIS — L570 Actinic keratosis: Secondary | ICD-10-CM | POA: Diagnosis not present

## 2016-01-27 DIAGNOSIS — K582 Mixed irritable bowel syndrome: Secondary | ICD-10-CM | POA: Diagnosis not present

## 2016-01-27 DIAGNOSIS — R143 Flatulence: Secondary | ICD-10-CM | POA: Diagnosis not present

## 2016-02-01 ENCOUNTER — Ambulatory Visit: Payer: Medicare Other

## 2016-02-02 ENCOUNTER — Ambulatory Visit (INDEPENDENT_AMBULATORY_CARE_PROVIDER_SITE_OTHER): Payer: Medicare Other

## 2016-02-02 VITALS — BP 130/70 | HR 60 | Temp 97.4°F | Resp 12 | Ht 64.5 in | Wt 167.1 lb

## 2016-02-02 DIAGNOSIS — Z0000 Encounter for general adult medical examination without abnormal findings: Secondary | ICD-10-CM | POA: Diagnosis not present

## 2016-02-02 NOTE — Progress Notes (Signed)
Subjective:   Sabrina Montoya is a 75 y.o. female who presents for an Initial Medicare Annual Wellness Visit.  Review of Systems    No ROS.  Medicare Wellness Visit.   Cardiac Risk Factors include: advanced age (>107men, >79 women)     Objective:    Today's Vitals   02/02/16 1416  BP: 130/70  Pulse: 60  Temp: 97.4 F (36.3 C)  TempSrc: Oral  Resp: 12  Height: 5' 4.5" (1.638 m)  Weight: 167 lb 1.9 oz (75.805 kg)  SpO2: 98%   Body mass index is 28.25 kg/(m^2).   Current Medications (verified) Outpatient Encounter Prescriptions as of 02/02/2016  Medication Sig  . aspirin 81 MG tablet Take 81 mg by mouth daily.  Marland Kitchen buPROPion (WELLBUTRIN XL) 150 MG 24 hr tablet TAKE 1 TABLET BY MOUTH EACH MORNING.  . busPIRone (BUSPAR) 7.5 MG tablet TAKE 1 TABLET BY MOUTH TWO TO THREE TIMES A DAY  . cholecalciferol (VITAMIN D) 1000 UNITS tablet Take 1,000 Units by mouth daily.  . clonazePAM (KLONOPIN) 0.5 MG tablet TAKE 1 TABLET BY MOUTH TWICE DAILY AS NEEDED.  Marland Kitchen CRESTOR 40 MG tablet TAKE 1 TABLET BY MOUTH ONCE DAILY  . dicyclomine (BENTYL) 20 MG tablet Take 20 mg by mouth 2 (two) times daily.  . magnesium oxide (MAG-OX) 400 (241.3 Mg) MG tablet Take 1 tablet (400 mg total) by mouth daily.  . metoprolol tartrate (LOPRESSOR) 25 MG tablet TAKE 1/2 TABLET BY MOUTH TWICE DAILY  . nitroGLYCERIN (NITROSTAT) 0.4 MG SL tablet Place 1 tablet (0.4 mg total) under the tongue every 5 (five) minutes as needed. As needed for chest pain  . omeprazole (PRILOSEC) 20 MG capsule TAKE 1 CAPSULE BY MOUTH ONCE DAILY.  Marland Kitchen HYDROcodone-acetaminophen (NORCO/VICODIN) 5-325 MG per tablet Take 1 tablet by mouth every 6 (six) hours as needed for moderate pain.  . [DISCONTINUED] albuterol (PROVENTIL HFA;VENTOLIN HFA) 108 (90 BASE) MCG/ACT inhaler Inhale 2 puffs into the lungs every 6 (six) hours as needed for wheezing or shortness of breath.  . [DISCONTINUED] conjugated estrogens (PREMARIN) vaginal cream Place 1 Applicatorful  vaginally at bedtime.  . [DISCONTINUED] famotidine (PEPCID) 20 MG tablet TAKE 1 TABLET BY MOUTH TWICE DAILY BEFORE BREAKFAST AND DINNER  . [DISCONTINUED] polyethylene glycol powder (GLYCOLAX/MIRALAX) powder 255 grams one bottle for colonoscopy prep  . [DISCONTINUED] polyethylene glycol powder (GLYCOLAX/MIRALAX) powder Take 17 g by mouth daily.   No facility-administered encounter medications on file as of 02/02/2016.    Allergies (verified) Niacin and related; Sulfa drugs cross reactors; Tetracyclines & related; Latex; and Simvastatin   History: Past Medical History  Diagnosis Date  . Diverticulitis of colon   . Rheumatic fever     possible at age 23  . Tobacco abuse   . Hyperlipidemia   . Abdominal aortic aneurysm without mention of rupture     infrarenal, stable, folllowed by Leotis Pain  . CAD (coronary artery disease), autologous vein bypass graft   . Peripheral vascular disease ([place])     s/p CEA   . Tobacco abuse   . B12 deficiency   . Acute posthemorrhagic anemia   . Cardiac dysrhythmia, unspecified   . Neuropathy ([place])   . Reflux esophagitis   . Sliding hiatal hernia   . GERD (gastroesophageal reflux disease)   . Colon polyp   . IBS (irritable bowel syndrome)    Past Surgical History  Procedure Laterality Date  . Closed manipulation shoulder      left shoulder post  physical therapy, Right shoulder redo (toggle bolts)  . Rotator cuff repair  2002    right shoulder Dr. Nadean Corwin, [place] in [place]      Carotid stenosis found during workup for dysphagia,  [facility]  . Lumbar disc surgery  1971    L5, unssuccessful, fusion in 1983 successful (2 lumbar)  . Abdominal surgery  1976    for pain secondary to scar tissue, s/p apply  . 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 Hernia repair  may 2011    Dr. Pat Patrick  . Coronary artery bypass graft  2006    s/p triple bypass surgery, [facility]  .  Cholecystectomy  2002    Dr. Pat Patrick  . Coronary angiography  01/2011    one occluded artery with collateralization, other arteries patent  . Hernia repair    . Appendectomy  1974  . Colonoscopy  09/01/14  . Heart catherization  2016   Family History  Problem Relation Age of Onset  . Cancer Sister     cervical cancer  . Diabetes Sister   . Heart disease Brother     coronary artery disease  . Other Brother     suicide  . Diabetes Mother   . Heart disease Mother   . Diabetes Sister   . Other Father     suicide  . Cancer Sister     breast  . Other Other     colon resection due to inflammation -nephew   Social History   Occupational History  . Not on file.   Social History Main Topics  . Smoking status: Former Smoker -- 0.50 packs/day for 30 years    Types: Cigarettes    Quit date: 04/01/2012  . Smokeless tobacco: Never Used     Comment: quit for 2 years after sinus infection and 6 months after heart surgery  . Alcohol Use: No  . Drug Use: No  . Sexual Activity: Not Currently    Tobacco Counseling Counseling given: Not Answered   Activities of Daily Living In your present state of health, do you have any difficulty performing the following activities: 02/02/2016  Hearing? N  Vision? N  Difficulty concentrating or making decisions? N  Walking or climbing stairs? Y  Dressing or bathing? N  Doing errands, shopping? N  Preparing Food and eating ? N  Using the Toilet? N  In the past six months, have you accidently leaked urine? N  Do you have problems with loss of bowel control? Y  Managing your Medications? N  Managing your Finances? N  Housekeeping or managing your Housekeeping? N    Immunizations and Health Maintenance Immunization History  Administered Date(s) Administered  . Influenza Split 10/10/2011, 08/26/2012  . Influenza,inj,Quad PF,36+ Mos 07/25/2013, 07/28/2014, 08/13/2015  . Influenza-Unspecified 08/06/2012  . Pneumococcal Conjugate-13 08/26/2014    . Pneumococcal Polysaccharide-23 09/02/2010   Health Maintenance Due  Topic Date Due  . TETANUS/TDAP  05/27/1961  . ZOSTAVAX  05/27/2002    Patient Care Team: Crecencio Mc, MD as PCP - General (Internal Medicine) Crecencio Mc, MD (Internal Medicine) Robert Bellow, MD (General Surgery)  Indicate any recent Medical Services you may have received from other than [facility] providers in the past year (date may be approximate).     Assessment:   This is a routine wellness examination for Maryana. The goal of the wellness visit is to assist  the patient how to close the gaps in care and create a preventative care plan for the patient.   Taking VIT D Calcium as appropriate/Osteoporosis risk reviewed.  Medications reviewed; taking with some concerns regarding continued use of Magnesium, Buspar, Wellbutrin.  Safety issues reviewed; smoke detectors in the home. Firearms locked in a safe area in the home. Wears seatbelts when driving or riding with others. No violence in the home.  No identified risk were noted; The patient was oriented x 3; appropriate in dress and manner and no objective failures at ADL's or IADL's.   Abdominal aortic aneurysm, without rupture-stable and followed [facility], MD [facility] and PCP. Periph vascular dis NOS-stable and followed by [facility] Pain, MD [facility] and PCP.   ZOSTAVAX and TDAP vaccine postponed for follow up with PCP for possible compromised immune system.  Hx of CA.   Patient Concerns:  Uncertain if she still needs to be taking Magnesium, Buspar, Wellbutrin. Increased acid reflux; discontinued Prilosec. Discuss the need for an early MM due to recent fall 3 weeks ago; missed a step and bruised R side ribs/breast, XRAY completed at Urgent Care, see care everywhere. Deferred to follow up with PCP at upcoming CPE for all of the above.  Hearing/Vision screen Hearing Screening Comments: Passes the whisper test Vision Screening  Comments: Followed by [facility] eye macular degeneration Wears glasses Annual visits  Dietary issues and exercise activities discussed: Current Exercise Habits: The patient does not participate in regular exercise at present  Goals    . Healthy Lifestyle     Stay hydrated! Drink plenty of fluids. Increase water intake. Low carb foods. Lean meats, fruits and vegetables.   Stay active! Start home exercise routine and walk as often as possible.      Depression Screen PHQ 2/9 Scores 02/02/2016 04/30/2014  PHQ - 2 Score 0 1    Fall Risk Fall Risk  02/02/2016 04/30/2014  Falls in the past year? Yes No  Number falls in past yr: 1 -  Injury with Fall? Yes -  Follow up Education provided;Falls prevention discussed -    Cognitive Function: MMSE - Mini Mental State Exam 02/02/2016  Orientation to time 5  Orientation to Place 5  Registration 3  Attention/ Calculation 5  Recall 3  Language- name 2 objects 2  Language- repeat 1  Language- follow 3 step command 3  Language- read & follow direction 1  Write a sentence 1  Copy design 1  Total score 30    Screening Tests Health Maintenance  Topic Date Due  . TETANUS/TDAP  05/27/1961  . ZOSTAVAX  05/27/2002  . INFLUENZA VACCINE  06/06/2016  . MAMMOGRAM  09/08/2016  . COLONOSCOPY  09/01/2024  . DEXA SCAN  Completed  . PNA vac Low Risk Adult  Completed      Plan:   End of life planning; Advance aging; Advanced directives discussed. Copy of current HCPOA/Living Will requested.    During the course of the visit, Calyx was educated and counseled about the following appropriate screening and preventive services:   Vaccines to include Pneumoccal, Influenza, Hepatitis B, Td, Zostavax, HCV  Electrocardiogram  Cardiovascular disease screening  Colorectal cancer screening  Bone density screening  Diabetes screening  Glaucoma screening  Mammography/PAP  Nutrition counseling  Smoking cessation  counseling  Patient Instructions (the written plan) were given to the patient.    Varney Biles, LPN   [id number]

## 2016-02-02 NOTE — Progress Notes (Signed)
  I have reviewed the above information and agree with above.   Deacon Gadbois, MD 

## 2016-02-02 NOTE — Patient Instructions (Addendum)
Sabrina Montoya , Thank you for taking time to come for your Medicare Wellness Visit. I appreciate your ongoing commitment to your health goals. Please review the following plan we discussed and let me know if I can assist you in the future.   Return on Friday for physical with Dr. Derrel Nip.   This is a list of the screening recommended for you and due dates:  Health Maintenance  Topic Date Due  . Tetanus Vaccine  05/27/1961  . Shingles Vaccine  05/27/2002  . Flu Shot  06/06/2016  . Mammogram  09/08/2016  . Colon Cancer Screening  09/01/2024  . DEXA scan (bone density measurement)  Completed  . Pneumonia vaccines  Completed    Hearing Loss Hearing loss is a partial or total loss of the ability to hear. This can be temporary or permanent, and it can happen in one or both ears. Hearing loss may be referred to as deafness. Medical care is necessary to treat hearing loss properly and to prevent the condition from getting worse. Your hearing may partially or completely come back, depending on what caused your hearing loss and how severe it is. In some cases, hearing loss is permanent. CAUSES Common causes of hearing loss include:   Too much wax in the ear canal.   Infection of the ear canal or middle ear.   Fluid in the middle ear.   Injury to the ear or surrounding area.   An object stuck in the ear.   Prolonged exposure to loud sounds, such as music.  Less common causes of hearing loss include:   Tumors in the ear.   Viral or bacterial infections, such as meningitis.   A hole in the eardrum (perforated eardrum).  Problems with the hearing nerve that sends signals between the brain and the ear.  Certain medicines.  SYMPTOMS  Symptoms of this condition may include:  Difficulty telling the difference between sounds.  Difficulty following a conversation when there is background noise.  Lack of response to sounds in your environment. This may be most noticeable when you  do not respond to startling sounds.  Needing to turn up the volume on the television, radio, etc.  Ringing in the ears.  Dizziness.  Pain in the ears. DIAGNOSIS This condition is diagnosed based on a physical exam and a hearing test (audiometry). The audiometry test will be performed by a hearing specialist (audiologist). You may also be referred to an ear, nose, and throat (ENT) specialist (otolaryngologist).  TREATMENT Treatment for recent onset of hearing loss may include:   Ear wax removal.   Being prescribed medicines to prevent infection (antibiotics).   Being prescribed medicines to reduce inflammation (corticosteroids).  HOME CARE INSTRUCTIONS  If you were prescribed an antibiotic medicine, take it as told by your health care provider. Do not stop taking the antibiotic even if you start to feel better.  Take over-the-counter and prescription medicines only as told by your health care provider.  Avoid loud noises.   Return to your normal activities as told by your health care provider. Ask your health care provider what activities are safe for you.  Keep all follow-up visits as told by your health care provider. This is important. SEEK MEDICAL CARE IF:   You feel dizzy.   You develop new symptoms.   You vomit or feel nauseous.   You have a fever.  SEEK IMMEDIATE MEDICAL CARE IF:  You develop sudden changes in your vision.   You have  severe ear pain.   You have new or increased weakness.  You have a severe headache.   This information is not intended to replace advice given to you by your health care provider. Make sure you discuss any questions you have with your health care provider.   Document Released: 10/23/2005 Document Revised: 07/14/2015 Document Reviewed: 03/10/2015 Elsevier Interactive Patient Education [employer].

## 2016-02-02 NOTE — Progress Notes (Signed)
  I have reviewed the above information and agree with above.   Teresa Tullo, MD 

## 2016-02-04 ENCOUNTER — Encounter: Payer: Self-pay | Admitting: Internal Medicine

## 2016-02-04 ENCOUNTER — Ambulatory Visit (INDEPENDENT_AMBULATORY_CARE_PROVIDER_SITE_OTHER): Payer: Medicare Other | Admitting: Internal Medicine

## 2016-02-04 VITALS — BP 126/70 | HR 64 | Temp 98.1°F | Resp 12 | Ht 65.0 in | Wt 162.2 lb

## 2016-02-04 DIAGNOSIS — F450 Somatization disorder: Secondary | ICD-10-CM

## 2016-02-04 DIAGNOSIS — E612 Magnesium deficiency: Secondary | ICD-10-CM

## 2016-02-04 DIAGNOSIS — F329 Major depressive disorder, single episode, unspecified: Secondary | ICD-10-CM

## 2016-02-04 DIAGNOSIS — Z0000 Encounter for general adult medical examination without abnormal findings: Secondary | ICD-10-CM

## 2016-02-04 DIAGNOSIS — E785 Hyperlipidemia, unspecified: Secondary | ICD-10-CM | POA: Diagnosis not present

## 2016-02-04 DIAGNOSIS — R5383 Other fatigue: Secondary | ICD-10-CM

## 2016-02-04 DIAGNOSIS — Z1239 Encounter for other screening for malignant neoplasm of breast: Secondary | ICD-10-CM

## 2016-02-04 DIAGNOSIS — Z7289 Other problems related to lifestyle: Secondary | ICD-10-CM

## 2016-02-04 DIAGNOSIS — D509 Iron deficiency anemia, unspecified: Secondary | ICD-10-CM | POA: Diagnosis not present

## 2016-02-04 DIAGNOSIS — F32A Depression, unspecified: Secondary | ICD-10-CM

## 2016-02-04 DIAGNOSIS — E559 Vitamin D deficiency, unspecified: Secondary | ICD-10-CM

## 2016-02-04 MED ORDER — DICYCLOMINE HCL 20 MG PO TABS: 20.0000 mg | ORAL_TABLET | Freq: Two times a day (BID) | ORAL | Status: DC

## 2016-02-04 NOTE — Progress Notes (Signed)
Pre-visit discussion using our clinic review tool. No additional management support is needed unless otherwise documented below in the visit note.  

## 2016-02-04 NOTE — Progress Notes (Addendum)
Patient ID: Sabrina Montoya, female    DOB: 10-11-1941  Age: 75 y.o. MRN: [id number]   Review of Systems   Patient denies headache, fevers, malaise, unintentional weight loss, skin rash, eye pain, sinus congestion and sinus pain, sore throat, dysphagia,  hemoptysis , cough, dyspnea, wheezing, chest pain, palpitations, orthopnea, edema, abdominal pain, nausea, melena, diarrhea, constipation, flank pain, dysuria, hematuria, urinary  Frequency, nocturia, numbness, tingling, seizures,  Focal weakness, Loss of consciousness,  Tremor, insomnia, depression, anxiety, and suicidal ideation.     Objective:  BP 126/70 mmHg  Pulse 64  Temp(Src) 98.1 F (36.7 C) (Oral)  Resp 12  Ht 5\' 5"  (1.651 m)  Wt 162 lb 4 oz (73.596 kg)  BMI 27.00 kg/m2  SpO2 96%  Physical Exam     Assessment & Plan:

## 2016-02-04 NOTE — Patient Instructions (Addendum)
You can suspend your magnesium supplement and return here instead of Cancer center for recheck level in one month  I will order your mammogram   We will do  fasting labs in one month   Try suspending morning dose of clonazepam to see if you really need it.  Menopause is a normal process in which your reproductive ability comes to an end. This process happens gradually over a span of months to years, usually between the ages of 79 and 80. Menopause is complete when you have missed 12 consecutive menstrual periods. It is important to talk with your health care provider about some of the most common conditions that affect postmenopausal women, such as heart disease, cancer, and bone loss (osteoporosis). Adopting a healthy lifestyle and getting preventive care can help to promote your health and wellness. Those actions can also lower your chances of developing some of these common conditions. WHAT SHOULD I KNOW ABOUT MENOPAUSE? During menopause, you may experience a number of symptoms, such as:  Moderate-to-severe hot flashes.  Night sweats.  Decrease in sex drive.  Mood swings.  Headaches.  Tiredness.  Irritability.  Memory problems.  Insomnia. Choosing to treat or not to treat menopausal changes is an individual decision that you make with your health care provider. WHAT SHOULD I KNOW ABOUT HORMONE REPLACEMENT THERAPY AND SUPPLEMENTS? Hormone therapy products are effective for treating symptoms that are associated with menopause, such as hot flashes and night sweats. Hormone replacement carries certain risks, especially as you become older. If you are thinking about using estrogen or estrogen with progestin treatments, discuss the benefits and risks with your health care provider. WHAT SHOULD I KNOW ABOUT HEART DISEASE AND STROKE? Heart disease, heart attack, and stroke become more likely as you age. This may be due, in part, to the hormonal changes that your body experiences during  menopause. These can affect how your body processes dietary fats, triglycerides, and cholesterol. Heart attack and stroke are both medical emergencies. There are many things that you can do to help prevent heart disease and stroke:  Have your blood pressure checked at least every 1-2 years. High blood pressure causes heart disease and increases the risk of stroke.  If you are 69-25 years old, ask your health care provider if you should take aspirin to prevent a heart attack or a stroke.  Do not use any tobacco products, including cigarettes, chewing tobacco, or electronic cigarettes. If you need help quitting, ask your health care provider.  It is important to eat a healthy diet and maintain a healthy weight.  Be sure to include plenty of vegetables, fruits, low-fat dairy products, and lean protein.  Avoid eating foods that are high in solid fats, added sugars, or salt (sodium).  Get regular exercise. This is one of the most important things that you can do for your health.  Try to exercise for at least 150 minutes each week. The type of exercise that you do should increase your heart rate and make you sweat. This is known as moderate-intensity exercise.  Try to do strengthening exercises at least twice each week. Do these in addition to the moderate-intensity exercise.  Know your numbers.Ask your health care provider to check your cholesterol and your blood glucose. Continue to have your blood tested as directed by your health care provider. WHAT SHOULD I KNOW ABOUT CANCER SCREENING? There are several types of cancer. Take the following steps to reduce your risk and to catch any cancer development as early  as possible. Breast Cancer  Practice breast self-awareness.  This means understanding how your breasts normally appear and feel.  It also means doing regular breast self-exams. Let your health care provider know about any changes, no matter how small.  If you are 32 or older, have  a clinician do a breast exam (clinical breast exam or CBE) every year. Depending on your age, family history, and medical history, it may be recommended that you also have a yearly breast X-ray (mammogram).  If you have a family history of breast cancer, talk with your health care provider about genetic screening.  If you are at high risk for breast cancer, talk with your health care provider about having an MRI and a mammogram every year.  Breast cancer (BRCA) gene test is recommended for women who have family members with BRCA-related cancers. Results of the assessment will determine the need for genetic counseling and BRCA1 and for BRCA2 testing. BRCA-related cancers include these types:  Breast. This occurs in males or females.  Ovarian.  Tubal. This may also be called fallopian tube cancer.  Cancer of the abdominal or pelvic lining (peritoneal cancer).  Prostate.  Pancreatic. Cervical, Uterine, and Ovarian Cancer Your health care provider may recommend that you be screened regularly for cancer of the pelvic organs. These include your ovaries, uterus, and vagina. This screening involves a pelvic exam, which includes checking for microscopic changes to the surface of your cervix (Pap test).  For women ages 21-65, health care providers may recommend a pelvic exam and a Pap test every three years. For women ages 34-65, they may recommend the Pap test and pelvic exam, combined with testing for human papilloma virus (HPV), every five years. Some types of HPV increase your risk of cervical cancer. Testing for HPV may also be done on women of any age who have unclear Pap test results.  Other health care providers may not recommend any screening for nonpregnant women who are considered low risk for pelvic cancer and have no symptoms. Ask your health care provider if a screening pelvic exam is right for you.  If you have had past treatment for cervical cancer or a condition that could lead to  cancer, you need Pap tests and screening for cancer for at least 20 years after your treatment. If Pap tests have been discontinued for you, your risk factors (such as having a new sexual partner) need to be reassessed to determine if you should start having screenings again. Some women have medical problems that increase the chance of getting cervical cancer. In these cases, your health care provider may recommend that you have screening and Pap tests more often.  If you have a family history of uterine cancer or ovarian cancer, talk with your health care provider about genetic screening.  If you have vaginal bleeding after reaching menopause, tell your health care provider.  There are currently no reliable tests available to screen for ovarian cancer. Lung Cancer Lung cancer screening is recommended for adults 72-56 years old who are at high risk for lung cancer because of a history of smoking. A yearly low-dose CT scan of the lungs is recommended if you:  Currently smoke.  Have a history of at least 30 pack-years of smoking and you currently smoke or have quit within the past 15 years. A pack-year is smoking an average of one pack of cigarettes per day for one year. Yearly screening should:  Continue until it has been 15 years since you  quit.  Stop if you develop a health problem that would prevent you from having lung cancer treatment. Colorectal Cancer  This type of cancer can be detected and can often be prevented.  Routine colorectal cancer screening usually begins at age 9 and continues through age 24.  If you have risk factors for colon cancer, your health care provider may recommend that you be screened at an earlier age.  If you have a family history of colorectal cancer, talk with your health care provider about genetic screening.  Your health care provider may also recommend using home test kits to check for hidden blood in your stool.  A small camera at the end of a tube  can be used to examine your colon directly (sigmoidoscopy or colonoscopy). This is done to check for the earliest forms of colorectal cancer.  Direct examination of the colon should be repeated every 5-10 years until age 47. However, if early forms of precancerous polyps or small growths are found or if you have a family history or genetic risk for colorectal cancer, you may need to be screened more often. Skin Cancer  Check your skin from head to toe regularly.  Monitor any moles. Be sure to tell your health care provider:  About any new moles or changes in moles, especially if there is a change in a mole's shape or color.  If you have a mole that is larger than the size of a pencil eraser.  If any of your family members has a history of skin cancer, especially at a young age, talk with your health care provider about genetic screening.  Always use sunscreen. Apply sunscreen liberally and repeatedly throughout the day.  Whenever you are outside, protect yourself by wearing long sleeves, pants, a wide-brimmed hat, and sunglasses. WHAT SHOULD I KNOW ABOUT OSTEOPOROSIS? Osteoporosis is a condition in which bone destruction happens more quickly than new bone creation. After menopause, you may be at an increased risk for osteoporosis. To help prevent osteoporosis or the bone fractures that can happen because of osteoporosis, the following is recommended:  If you are 58-47 years old, get at least 1,000 mg of calcium and at least 600 mg of vitamin D per day.  If you are older than age 62 but younger than age 48, get at least 1,200 mg of calcium and at least 600 mg of vitamin D per day.  If you are older than age 67, get at least 1,200 mg of calcium and at least 800 mg of vitamin D per day. Smoking and excessive alcohol intake increase the risk of osteoporosis. Eat foods that are rich in calcium and vitamin D, and do weight-bearing exercises several times each week as directed by your health care  provider. WHAT SHOULD I KNOW ABOUT HOW MENOPAUSE AFFECTS [place]? Depression may occur at any age, but it is more common as you become older. Common symptoms of depression include:  Low or sad mood.  Changes in sleep patterns.  Changes in appetite or eating patterns.  Feeling an overall lack of motivation or enjoyment of activities that you previously enjoyed.  Frequent crying spells. Talk with your health care provider if you think that you are experiencing depression. WHAT SHOULD I KNOW ABOUT IMMUNIZATIONS? It is important that you get and maintain your immunizations. These include:  Tetanus, diphtheria, and pertussis (Tdap) booster vaccine.  Influenza every year before the flu season begins.  Pneumonia vaccine.  Shingles vaccine. Your health care provider may  also recommend other immunizations.   This information is not intended to replace advice given to you by your health care provider. Make sure you discuss any questions you have with your health care provider.   Document Released: 12/15/2005 Document Revised: 11/13/2014 Document Reviewed: 06/25/2014 Elsevier Interactive Patient Education [employer].

## 2016-02-06 DIAGNOSIS — Z1239 Encounter for other screening for malignant neoplasm of breast: Secondary | ICD-10-CM | POA: Insufficient documentation

## 2016-02-06 DIAGNOSIS — Z0000 Encounter for general adult medical examination without abnormal findings: Secondary | ICD-10-CM | POA: Insufficient documentation

## 2016-02-06 NOTE — Assessment & Plan Note (Signed)

## 2016-02-06 NOTE — Assessment & Plan Note (Signed)
Symptoms have improved since her last visit in March 2016.  She is no longer taking buspirone and has no pain complaints today.

## 2016-02-09 ENCOUNTER — Other Ambulatory Visit: Payer: Self-pay | Admitting: Internal Medicine

## 2016-02-09 DIAGNOSIS — H2513 Age-related nuclear cataract, bilateral: Secondary | ICD-10-CM | POA: Diagnosis not present

## 2016-02-09 DIAGNOSIS — H353131 Nonexudative age-related macular degeneration, bilateral, early dry stage: Secondary | ICD-10-CM | POA: Diagnosis not present

## 2016-02-09 NOTE — Assessment & Plan Note (Signed)
Now no longer requiring infusions.  Dose reduced.  Recheck one month.

## 2016-02-09 NOTE — Assessment & Plan Note (Signed)
She is overdue for fasting labs and ;lfts.  Taking Crestor 40 mg daily due to history of CAD   Lab Results  Component Value Date   CHOL 125 11/19/2014   HDL 33.20* 11/19/2014   LDLCALC 69 11/19/2014   LDLDIRECT 79.0 07/01/2013   TRIG 116.0 11/19/2014   CHOLHDL 4 11/19/2014   Lab Results  Component Value Date   ALT 22 08/09/2015   AST 35 08/09/2015   ALKPHOS 69 08/09/2015   BILITOT 0.6 08/09/2015

## 2016-02-09 NOTE — Progress Notes (Addendum)
Subjective:  Patient ID: Sabrina Montoya, female    DOB: [birth year]  Age: 75 y.o. MRN: [id number]  CC: The primary encounter diagnosis was Magnesium deficiency. Diagnoses of Anemia, iron deficiency, Breast cancer screening, Hyperlipidemia, Other fatigue, Other problems related to lifestyle, Vitamin D deficiency, Depression with somatization, Medicare annual wellness visit, subsequent, and Hypomagnesemia were also pertinent to this visit.  HPI Sabrina Montoya presents for follow up on chronic issues including GAD with depression   Hyperlipidemia. , and chronic pain.    She has not been seen since March 2016, at which time she was having a change in stools and was referred to GI.   She has been seeingn a Barrister[employer] and has  Noted improved mood with less anxiety and grief. Still taking clonazepam twice daily,  Morning and evening.   Taking her medications as directed.  H as not required a magnesium infusion in over 6 months.   Outpatient Prescriptions Prior to Visit  Medication Sig Dispense Refill  . aspirin 81 MG tablet Take 81 mg by mouth daily.    Marland Kitchen buPROPion (WELLBUTRIN XL) 150 MG 24 hr tablet TAKE 1 TABLET BY MOUTH EACH MORNING. 30 tablet 1  . cholecalciferol (VITAMIN D) 1000 UNITS tablet Take 1,000 Units by mouth daily.    . clonazePAM (KLONOPIN) 0.5 MG tablet TAKE 1 TABLET BY MOUTH TWICE DAILY AS NEEDED. 60 tablet 1  . CRESTOR 40 MG tablet TAKE 1 TABLET BY MOUTH ONCE DAILY 30 tablet 0  . HYDROcodone-acetaminophen (NORCO/VICODIN) 5-325 MG per tablet Take 1 tablet by mouth every 6 (six) hours as needed for moderate pain. 60 tablet 0  . magnesium oxide (MAG-OX) 400 (241.3 Mg) MG tablet Take 1 tablet (400 mg total) by mouth daily. 60 tablet 5  . metoprolol tartrate (LOPRESSOR) 25 MG tablet TAKE 1/2 TABLET BY MOUTH TWICE DAILY 30 tablet 5  . nitroGLYCERIN (NITROSTAT) 0.4 MG SL tablet Place 1 tablet (0.4 mg total) under the tongue every 5 (five) minutes as needed. As needed for chest pain 10  tablet 1  . omeprazole (PRILOSEC) 20 MG capsule TAKE 1 CAPSULE BY MOUTH ONCE DAILY. 30 capsule 4  . busPIRone (BUSPAR) 7.5 MG tablet TAKE 1 TABLET BY MOUTH TWO TO THREE TIMES A DAY 90 tablet 5  . dicyclomine (BENTYL) 20 MG tablet Take 20 mg by mouth 2 (two) times daily.     No facility-administered medications prior to visit.    Review of Systems;  Patient denies headache, fevers, malaise, unintentional weight loss, skin rash, eye pain, sinus congestion and sinus pain, sore throat, dysphagia,  hemoptysis , cough, dyspnea, wheezing, chest pain, palpitations, orthopnea, edema, abdominal pain, nausea, melena, diarrhea, constipation, flank pain, dysuria, hematuria, urinary  Frequency, nocturia, numbness, tingling, seizures,  Focal weakness, Loss of consciousness,  Tremor, insomnia, depression, anxiety, and suicidal ideation.      Objective:  BP 126/70 mmHg  Pulse 64  Temp(Src) 98.1 F (36.7 C) (Oral)  Resp 12  Ht 5\' 5"  (1.651 m)  Wt 162 lb 4 oz (73.596 kg)  BMI 27.00 kg/m2  SpO2 96%  BP Readings from Last 3 Encounters:  02/04/16 126/70  02/02/16 130/70  08/13/15 132/72    Wt Readings from Last 3 Encounters:  02/04/16 162 lb 4 oz (73.596 kg)  02/02/16 167 lb 1.9 oz (75.805 kg)  08/13/15 161 lb 6.4 oz (73.211 kg)    General appearance: alert, cooperative and appears stated age Ears: normal TM's and external ear canals both  ears Throat: lips, mucosa, and tongue normal; teeth and gums normal Neck: no adenopathy, no carotid bruit, supple, symmetrical, trachea midline and thyroid not enlarged, symmetric, no tenderness/mass/nodules Back: symmetric, no curvature. ROM normal. No CVA tenderness. Lungs: clear to auscultation bilaterally Heart: regular rate and rhythm, S1, S2 normal, no murmur, click, rub or gallop Abdomen: soft, non-tender; bowel sounds normal; no masses,  no organomegaly Pulses: 2+ and symmetric Skin: Skin color, texture, turgor normal. No rashes or lesions Lymph  nodes: Cervical, supraclavicular, and axillary nodes normal.  Lab Results  Component Value Date   HGBA1C 6.0* 09/11/2013    Lab Results  Component Value Date   CREATININE 1.11* 08/09/2015   CREATININE 1.34* 06/04/2015   CREATININE 1.25* 04/23/2015    Lab Results  Component Value Date   WBC 4.5 12/15/2015   HGB 11.0* 12/15/2015   HCT 32.2* 12/15/2015   PLT 200 12/15/2015   GLUCOSE 122* 08/09/2015   CHOL 125 11/19/2014   TRIG 116.0 11/19/2014   HDL 33.20* 11/19/2014   LDLDIRECT 79.0 07/01/2013   LDLCALC 69 11/19/2014   ALT 22 08/09/2015   AST 35 08/09/2015   NA 137 08/09/2015   K 4.7 08/09/2015   CL 103 08/09/2015   CREATININE 1.11* 08/09/2015   BUN 26* 08/09/2015   CO2 27 08/09/2015   TSH 1.61 11/19/2014   HGBA1C 6.0* 09/11/2013    No results found.  Assessment & Plan:   Problem List Items Addressed This Visit    Depression with somatization    Symptoms have improved since her last visit in March 2016.  She is no longer taking buspirone and has no pain complaints today. Advised to reduce her clonazepam use in the am to prn.       Medicare annual wellness visit, subsequent    Annual Medicare wellness  exam was done as well as a comprehensive physical exam and management of acute and chronic conditions .  During the course of the visit the patient was educated and counseled about appropriate screening and preventive services including : fall prevention , diabetes screening, nutrition counseling, colorectal cancer screening, and recommended immunizations.  Printed recommendations for health maintenance screenings was given.       Hyperlipidemia   Relevant Medications   isosorbide mononitrate (IMDUR) 30 MG 24 hr tablet   Other Relevant Orders   Lipid panel    Other Visit Diagnoses    Magnesium deficiency    -  Primary    Relevant Orders    Magnesium    Anemia, iron deficiency        Relevant Orders    CBC with Differential/Platelet    Ferritin    Iron and  TIBC    Breast cancer screening        Relevant Orders    MM DIGITAL SCREENING BILATERAL    Other fatigue        Relevant Orders    Comprehensive metabolic panel    TSH    Other problems related to lifestyle        Relevant Orders    Hepatitis C antibody    HCV RNA quant    Vitamin D deficiency        Relevant Orders    VITAMIN D 25 Hydroxy (Vit-D Deficiency, Fractures)      A total of 40 minutes was spent with patient more than half of which was spent in counseling patient on the above mentioned issues , reviewing and explaining recent labs and imaging  studies done, and coordination of care. I have discontinued Ms. Dicamillo's busPIRone. I have also changed her dicyclomine. Additionally, I am having her maintain her cholecalciferol, aspirin, nitroGLYCERIN, HYDROcodone-acetaminophen, clonazePAM, buPROPion, metoprolol tartrate, omeprazole, CRESTOR, magnesium oxide, and isosorbide mononitrate.  Meds ordered this encounter  Medications  . isosorbide mononitrate (IMDUR) 30 MG 24 hr tablet    Sig: Take 30 mg by mouth daily.  Marland Kitchen dicyclomine (BENTYL) 20 MG tablet    Sig: Take 1 tablet (20 mg total) by mouth 2 (two) times daily.    Dispense:  60 tablet    Refill:  11    Keep on file for future refills    Medications Discontinued During This Encounter  Medication Reason  . busPIRone (BUSPAR) 7.5 MG tablet   . dicyclomine (BENTYL) 20 MG tablet Reorder    Follow-up: Return in about 4 weeks (around 03/03/2016) for fasitng labs . 3 months Dr Derrel Nip.   Crecencio Mc, MD

## 2016-02-09 NOTE — Addendum Note (Signed)
Addended by: Crecencio Mc on: 02/09/2016 01:45 PM   Modules accepted: Level of Service, SmartSet

## 2016-02-11 ENCOUNTER — Ambulatory Visit: Payer: Medicare Other | Admitting: Internal Medicine

## 2016-02-11 ENCOUNTER — Other Ambulatory Visit: Payer: Medicare Other

## 2016-02-15 NOTE — Progress Notes (Signed)
Cologuard ordered

## 2016-02-25 DIAGNOSIS — I788 Other diseases of capillaries: Secondary | ICD-10-CM | POA: Diagnosis not present

## 2016-02-25 DIAGNOSIS — S90561A Insect bite (nonvenomous), right ankle, initial encounter: Secondary | ICD-10-CM | POA: Diagnosis not present

## 2016-02-25 DIAGNOSIS — L218 Other seborrheic dermatitis: Secondary | ICD-10-CM | POA: Diagnosis not present

## 2016-02-25 DIAGNOSIS — S80861A Insect bite (nonvenomous), right lower leg, initial encounter: Secondary | ICD-10-CM | POA: Diagnosis not present

## 2016-03-01 DIAGNOSIS — I10 Essential (primary) hypertension: Secondary | ICD-10-CM | POA: Diagnosis not present

## 2016-03-01 DIAGNOSIS — I25119 Atherosclerotic heart disease of native coronary artery with unspecified angina pectoris: Secondary | ICD-10-CM | POA: Diagnosis not present

## 2016-03-02 ENCOUNTER — Other Ambulatory Visit: Payer: Self-pay | Admitting: Internal Medicine

## 2016-03-03 NOTE — Telephone Encounter (Signed)
Please call in the clonazepam refills that I authorized since I am out of office,   The crestor requires labs for any subsequent refills, and she has not returned for the ones I ordered,  So only 30 days is authoirzed and sent

## 2016-03-06 DIAGNOSIS — I6523 Occlusion and stenosis of bilateral carotid arteries: Secondary | ICD-10-CM | POA: Diagnosis not present

## 2016-03-06 NOTE — Telephone Encounter (Signed)
Refill faxed

## 2016-03-07 ENCOUNTER — Other Ambulatory Visit (INDEPENDENT_AMBULATORY_CARE_PROVIDER_SITE_OTHER): Payer: Medicare Other

## 2016-03-07 DIAGNOSIS — E785 Hyperlipidemia, unspecified: Secondary | ICD-10-CM

## 2016-03-07 DIAGNOSIS — R5383 Other fatigue: Secondary | ICD-10-CM

## 2016-03-07 DIAGNOSIS — E559 Vitamin D deficiency, unspecified: Secondary | ICD-10-CM | POA: Diagnosis not present

## 2016-03-07 DIAGNOSIS — Z7289 Other problems related to lifestyle: Secondary | ICD-10-CM | POA: Diagnosis not present

## 2016-03-07 DIAGNOSIS — E612 Magnesium deficiency: Secondary | ICD-10-CM

## 2016-03-07 DIAGNOSIS — I714 Abdominal aortic aneurysm, without rupture: Secondary | ICD-10-CM | POA: Diagnosis not present

## 2016-03-07 DIAGNOSIS — I10 Essential (primary) hypertension: Secondary | ICD-10-CM | POA: Diagnosis not present

## 2016-03-07 DIAGNOSIS — D509 Iron deficiency anemia, unspecified: Secondary | ICD-10-CM

## 2016-03-07 DIAGNOSIS — I6523 Occlusion and stenosis of bilateral carotid arteries: Secondary | ICD-10-CM | POA: Diagnosis not present

## 2016-03-07 LAB — FERRITIN: FERRITIN: 93.1 ng/mL (ref 10.0–291.0)

## 2016-03-07 LAB — COMPREHENSIVE METABOLIC PANEL
ALK PHOS: 75 U/L (ref 39–117)
ALT: 19 U/L (ref 0–35)
AST: 30 U/L (ref 0–37)
Albumin: 4.7 g/dL (ref 3.5–5.2)
BUN: 21 mg/dL (ref 6–23)
CALCIUM: 9.6 mg/dL (ref 8.4–10.5)
CO2: 28 meq/L (ref 19–32)
Chloride: 107 mEq/L (ref 96–112)
Creatinine, Ser: 1.07 mg/dL (ref 0.40–1.20)
GFR: 53.31 mL/min — AB (ref >60.00)
GLUCOSE: 134 mg/dL — AB (ref 70–99)
POTASSIUM: 4.2 meq/L (ref 3.5–5.1)
Sodium: 143 mEq/L (ref 135–145)
Total Bilirubin: 0.5 mg/dL (ref 0.2–1.2)
Total Protein: 7.1 g/dL (ref 6.0–8.3)

## 2016-03-07 LAB — IRON AND TIBC
%SAT: 23 % (ref 11–50)
IRON: 66 ug/dL (ref 45–160)
TIBC: 291 ug/dL (ref 250–450)
UIBC: 225 ug/dL (ref 125–400)

## 2016-03-07 LAB — LIPID PANEL
CHOLESTEROL: 109 mg/dL (ref 0–200)
HDL: 36.6 mg/dL — ABNORMAL LOW (ref >39.00)
LDL CALC: 40 mg/dL (ref 0–99)
NonHDL: 72.82
TRIGLYCERIDES: 164 mg/dL — AB (ref 0.0–149.0)
Total CHOL/HDL Ratio: 3
VLDL: 32.8 mg/dL (ref 0.0–40.0)

## 2016-03-07 LAB — CBC WITH DIFFERENTIAL/PLATELET
BASOS PCT: 0.5 % (ref 0.0–3.0)
Basophils Absolute: 0 10*3/uL (ref 0.0–0.1)
EOS PCT: 2.9 % (ref 0.0–5.0)
Eosinophils Absolute: 0.1 10*3/uL (ref 0.0–0.7)
HEMATOCRIT: 31.8 % — AB (ref 36.0–46.0)
HEMOGLOBIN: 10.7 g/dL — AB (ref 12.0–15.0)
LYMPHS PCT: 33.8 % (ref 12.0–46.0)
Lymphs Abs: 1.5 10*3/uL (ref 0.7–4.0)
MCHC: 33.8 g/dL (ref 30.0–36.0)
MCV: 84.2 fl (ref 78.0–100.0)
Monocytes Absolute: 0.3 10*3/uL (ref 0.1–1.0)
Monocytes Relative: 7.5 % (ref 3.0–12.0)
NEUTROS ABS: 2.5 10*3/uL (ref 1.4–7.7)
Neutrophils Relative %: 55.3 % (ref 43.0–77.0)
PLATELETS: 214 10*3/uL (ref 150.0–400.0)
RBC: 3.77 Mil/uL — ABNORMAL LOW (ref 3.87–5.11)
RDW: 14.4 % (ref 11.5–15.5)
WBC: 4.5 10*3/uL (ref 4.0–10.5)

## 2016-03-07 LAB — VITAMIN D 25 HYDROXY (VIT D DEFICIENCY, FRACTURES): VITD: 52.88 ng/mL (ref 30.00–100.00)

## 2016-03-07 LAB — TSH: TSH: 1.79 u[IU]/mL (ref 0.35–4.50)

## 2016-03-07 LAB — MAGNESIUM: MAGNESIUM: 2.2 mg/dL (ref 1.5–2.5)

## 2016-03-08 ENCOUNTER — Encounter: Payer: Self-pay | Admitting: Internal Medicine

## 2016-03-08 LAB — HEPATITIS C ANTIBODY: HCV Ab: NEGATIVE

## 2016-03-08 LAB — HCV RNA QUANT: HEPATITIS C QUANTITATION: NOT DETECTED [IU]/mL

## 2016-03-09 ENCOUNTER — Ambulatory Visit: Payer: Medicare Other | Admitting: Internal Medicine

## 2016-03-09 DIAGNOSIS — I6523 Occlusion and stenosis of bilateral carotid arteries: Secondary | ICD-10-CM | POA: Diagnosis not present

## 2016-03-21 DIAGNOSIS — I6523 Occlusion and stenosis of bilateral carotid arteries: Secondary | ICD-10-CM | POA: Diagnosis not present

## 2016-03-21 DIAGNOSIS — I6529 Occlusion and stenosis of unspecified carotid artery: Secondary | ICD-10-CM | POA: Diagnosis not present

## 2016-03-22 DIAGNOSIS — T63481A Toxic effect of venom of other arthropod, accidental (unintentional), initial encounter: Secondary | ICD-10-CM | POA: Diagnosis not present

## 2016-03-22 DIAGNOSIS — S80869A Insect bite (nonvenomous), unspecified lower leg, initial encounter: Secondary | ICD-10-CM | POA: Diagnosis not present

## 2016-03-22 LAB — CBC AND DIFFERENTIAL
HEMATOCRIT: 10 % — AB (ref 36–46)
Hemoglobin: 10.1 g/dL — AB (ref 12.0–16.0)
Neutrophils Absolute: 8 /uL
Platelets: 14 10*3/uL — AB (ref 150–399)
WBC: 4.8 10*3/mL

## 2016-03-27 ENCOUNTER — Other Ambulatory Visit: Payer: Self-pay | Admitting: Surgical

## 2016-03-30 ENCOUNTER — Other Ambulatory Visit: Payer: Self-pay | Admitting: Internal Medicine

## 2016-04-29 ENCOUNTER — Other Ambulatory Visit: Payer: Self-pay | Admitting: Internal Medicine

## 2016-05-02 ENCOUNTER — Other Ambulatory Visit: Payer: Self-pay | Admitting: Internal Medicine

## 2016-05-16 ENCOUNTER — Encounter: Payer: Self-pay | Admitting: Internal Medicine

## 2016-05-16 ENCOUNTER — Ambulatory Visit (INDEPENDENT_AMBULATORY_CARE_PROVIDER_SITE_OTHER): Payer: Medicare Other | Admitting: Internal Medicine

## 2016-05-16 VITALS — BP 110/62 | HR 63 | Temp 98.7°F | Resp 10 | Ht 65.0 in | Wt 160.8 lb

## 2016-05-16 DIAGNOSIS — E1122 Type 2 diabetes mellitus with diabetic chronic kidney disease: Secondary | ICD-10-CM

## 2016-05-16 DIAGNOSIS — D6489 Other specified anemias: Secondary | ICD-10-CM

## 2016-05-16 DIAGNOSIS — E119 Type 2 diabetes mellitus without complications: Secondary | ICD-10-CM | POA: Diagnosis not present

## 2016-05-16 DIAGNOSIS — D510 Vitamin B12 deficiency anemia due to intrinsic factor deficiency: Secondary | ICD-10-CM | POA: Diagnosis not present

## 2016-05-16 DIAGNOSIS — I129 Hypertensive chronic kidney disease with stage 1 through stage 4 chronic kidney disease, or unspecified chronic kidney disease: Secondary | ICD-10-CM

## 2016-05-16 DIAGNOSIS — E1121 Type 2 diabetes mellitus with diabetic nephropathy: Secondary | ICD-10-CM | POA: Insufficient documentation

## 2016-05-16 DIAGNOSIS — N183 Chronic kidney disease, stage 3 (moderate): Secondary | ICD-10-CM

## 2016-05-16 DIAGNOSIS — E1140 Type 2 diabetes mellitus with diabetic neuropathy, unspecified: Secondary | ICD-10-CM | POA: Insufficient documentation

## 2016-05-16 DIAGNOSIS — F329 Major depressive disorder, single episode, unspecified: Secondary | ICD-10-CM

## 2016-05-16 DIAGNOSIS — R1310 Dysphagia, unspecified: Secondary | ICD-10-CM

## 2016-05-16 DIAGNOSIS — F450 Somatization disorder: Secondary | ICD-10-CM

## 2016-05-16 LAB — CBC WITH DIFFERENTIAL/PLATELET
Basophils Absolute: 0 10*3/uL (ref 0.0–0.1)
Basophils Relative: 0.4 % (ref 0.0–3.0)
EOS PCT: 4.6 % (ref 0.0–5.0)
Eosinophils Absolute: 0.2 10*3/uL (ref 0.0–0.7)
HCT: 31.4 % — ABNORMAL LOW (ref 36.0–46.0)
HEMOGLOBIN: 10.6 g/dL — AB (ref 12.0–15.0)
LYMPHS ABS: 1.7 10*3/uL (ref 0.7–4.0)
Lymphocytes Relative: 36 % (ref 12.0–46.0)
MCHC: 33.7 g/dL (ref 30.0–36.0)
MCV: 83.6 fl (ref 78.0–100.0)
MONO ABS: 0.3 10*3/uL (ref 0.1–1.0)
Monocytes Relative: 7.6 % (ref 3.0–12.0)
NEUTROS ABS: 2.4 10*3/uL (ref 1.4–7.7)
Neutrophils Relative %: 51.4 % (ref 43.0–77.0)
Platelets: 219 10*3/uL (ref 150.0–400.0)
RBC: 3.75 Mil/uL — AB (ref 3.87–5.11)
RDW: 14.9 % (ref 11.5–15.5)
WBC: 4.6 10*3/uL (ref 4.0–10.5)

## 2016-05-16 LAB — VITAMIN B12: Vitamin B-12: 472 pg/mL (ref 211–911)

## 2016-05-16 LAB — COMPREHENSIVE METABOLIC PANEL
ALBUMIN: 4.6 g/dL (ref 3.5–5.2)
ALK PHOS: 72 U/L (ref 39–117)
ALT: 21 U/L (ref 0–35)
AST: 35 U/L (ref 0–37)
BUN: 29 mg/dL — AB (ref 6–23)
CO2: 26 mEq/L (ref 19–32)
CREATININE: 1.25 mg/dL — AB (ref 0.40–1.20)
Calcium: 9.8 mg/dL (ref 8.4–10.5)
Chloride: 106 mEq/L (ref 96–112)
GFR: 44.53 mL/min — ABNORMAL LOW (ref >60.00)
GLUCOSE: 143 mg/dL — AB (ref 70–99)
Potassium: 4.3 mEq/L (ref 3.5–5.1)
SODIUM: 140 meq/L (ref 135–145)
TOTAL PROTEIN: 7.4 g/dL (ref 6.0–8.3)
Total Bilirubin: 0.5 mg/dL (ref 0.2–1.2)

## 2016-05-16 LAB — HEMOGLOBIN A1C: HEMOGLOBIN A1C: 6.9 % — AB (ref 4.6–6.5)

## 2016-05-16 MED ORDER — ESCITALOPRAM OXALATE 10 MG PO TABS: 10.0000 mg | ORAL_TABLET | Freq: Every day | ORAL | Status: DC

## 2016-05-16 NOTE — Progress Notes (Signed)
Pre-visit discussion using our clinic review tool. No additional management support is needed unless otherwise documented below in the visit note.  

## 2016-05-16 NOTE — Progress Notes (Signed)
Subjective:  Patient ID: Sabrina Montoya, female    DOB: [birth year]  Age: 75 y.o. MRN: [id number]  CC: The primary encounter diagnosis was Diabetes mellitus without complication ([place]). Diagnoses of Pernicious anemia, Depression with somatization, Anemia due to other cause, and Dysphagia were also pertinent to this visit.  HPI TIAJAH OYSTER presents for follow upon complicated grief , diet controlled DM. and GAD.  She feels less anxious but continues to have chronic insomnia and lies awake until 3 am despite using clonazepam at dinner.  Not sleepy till 3 am . Habits reviewed. She uses her cell phone to check facebook in the evening.   Lower Back still bothering her.  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  Uses heating pad   Anemia:  etiology unclear has not had endoscopy that was advised  bc Dr Candace Cruise perforated her sister's colon so she cancelled her appointment.   Discussed referral .  She has a history of esophageal stenosis with prior dilation .Marland Kitchen Her last EGD was done in 2012 with Dr Epimenio Foot . And she has been  some dysphagia for solid foods.    Outpatient Prescriptions Prior to Visit  Medication Sig Dispense Refill  . aspirin 81 MG tablet Take 81 mg by mouth daily.    Marland Kitchen buPROPion (WELLBUTRIN XL) 150 MG 24 hr tablet TAKE 1 TABLET BY MOUTH EACH MORNING. 30 tablet 4  . cholecalciferol (VITAMIN D) 1000 UNITS tablet Take 1,000 Units by mouth daily.    . clonazePAM (KLONOPIN) 0.5 MG tablet TAKE 1 TABLET BY MOUTH TWICE DAILY AS NEEDED. 60 tablet 5  . dicyclomine (BENTYL) 20 MG tablet Take 1 tablet (20 mg total) by mouth 2 (two) times daily. 60 tablet 11  . HYDROcodone-acetaminophen (NORCO/VICODIN) 5-325 MG per tablet Take 1 tablet by mouth every 6 (six) hours as needed for moderate pain. 60 tablet 0  . isosorbide mononitrate (IMDUR) 30 MG 24 hr tablet Take 30 mg by mouth daily.    . metoprolol tartrate (LOPRESSOR) 25 MG tablet TAKE 1/2 TABLET BY MOUTH TWICE DAILY 30 tablet 4  . nitroGLYCERIN  (NITROSTAT) 0.4 MG SL tablet Place 1 tablet (0.4 mg total) under the tongue every 5 (five) minutes as needed. As needed for chest pain 10 tablet 1  . omeprazole (PRILOSEC) 20 MG capsule TAKE 1 CAPSULE BY MOUTH ONCE DAILY. 30 capsule 4  . rosuvastatin (CRESTOR) 40 MG tablet TAKE 1 TABLET BY MOUTH ONCE DAILY 30 tablet 5  . magnesium oxide (MAG-OX) 400 (241.3 Mg) MG tablet Take 1 tablet (400 mg total) by mouth daily. (Patient not taking: Reported on 05/16/2016) 60 tablet 5   No facility-administered medications prior to visit.    Review of Systems;  Patient denies headache, fevers, malaise, unintentional weight loss, skin rash, eye pain, sinus congestion and sinus pain, sore throat, dysphagia,  hemoptysis , cough, dyspnea, wheezing, chest pain, palpitations, orthopnea, edema, abdominal pain, nausea, melena, diarrhea, constipation, flank pain, dysuria, hematuria, urinary  Frequency, nocturia, numbness, tingling, seizures,  Focal weakness, Loss of consciousness,  Tremor, insomnia, depression, anxiety, and suicidal ideation.      Objective:  BP 110/62 mmHg  Pulse 63  Temp(Src) 98.7 F (37.1 C) (Oral)  Resp 10  Ht _0  (1.651 m)  Wt 160 lb 12 oz (72.916 kg)  BMI 26.75 kg/m2  SpO2 97%  BP Readings from Last 3 Encounters:  05/16/16 110/62  02/04/16 126/70  02/02/16 130/70    Wt Readings from Last 3 Encounters:  05/16/16 160 lb 12 oz (72.916 kg)  02/04/16 162 lb 4 oz (73.596 kg)  02/02/16 167 lb 1.9 oz (75.805 kg)    General appearance: alert, cooperative and appears stated age Ears: normal TM's and external ear canals both ears Throat: lips, mucosa, and tongue normal; teeth and gums normal Neck: no adenopathy, no carotid bruit, supple, symmetrical, trachea midline and thyroid not enlarged, symmetric, no tenderness/mass/nodules Back: symmetric, no curvature. ROM normal. No CVA tenderness. Lungs: clear to auscultation bilaterally Heart: regular rate and rhythm, S1, S2 normal, no  murmur, click, rub or gallop Abdomen: soft, non-tender; bowel sounds normal; no masses,  no organomegaly Pulses: 2+ and symmetric Skin: Skin color, texture, turgor normal. No rashes or lesions Lymph nodes: Cervical, supraclavicular, and axillary nodes normal.  Lab Results  Component Value Date   HGBA1C 6.9* 05/16/2016   HGBA1C 6.0* 09/11/2013    Lab Results  Component Value Date   CREATININE 1.25* 05/16/2016   CREATININE 1.07 03/07/2016   CREATININE 1.11* 08/09/2015    Lab Results  Component Value Date   WBC 4.6 05/16/2016   HGB 10.6* 05/16/2016   HCT 31.4* 05/16/2016   PLT 219.0 05/16/2016   GLUCOSE 143* 05/16/2016   CHOL 109 03/07/2016   TRIG 164.0* 03/07/2016   HDL 36.60* 03/07/2016   LDLDIRECT 79.0 07/01/2013   LDLCALC 40 03/07/2016   ALT 21 05/16/2016   AST 35 05/16/2016   NA 140 05/16/2016   K 4.3 05/16/2016   CL 106 05/16/2016   CREATININE 1.25* 05/16/2016   BUN 29* 05/16/2016   CO2 26 05/16/2016   TSH 1.79 03/07/2016   HGBA1C 6.9* 05/16/2016    No results found.  Assessment & Plan:   Problem List Items Addressed This Visit    Anemia    Chronic, normocytic,  rechecking B12/folate and iron studies.  Refer to dr Epimenio Foot fo EGD.  Colonoscopy  was normal less than 10 months ago,  Will recheck given use of PPI.  Consider secondary to CKD.  Lab Results  Component Value Date   WBC 4.6 05/16/2016   HGB 10.6* 05/16/2016   HCT 31.4* 05/16/2016   MCV 83.6 05/16/2016   PLT 219.0 05/16/2016   Lab Results  Component Value Date   IRON 66 03/07/2016   TIBC 291 03/07/2016   FERRITIN 93.1 03/07/2016   Lab Results  Component Value Date   VITAMINB12 472 05/16/2016   No results found for: FOLATE         Depression with somatization    Her myriad somatic symptoms appear to have resolved but she continues to suffer from insomnia. Trial of lexapro to imporve outlook and address insomnia       Relevant Medications   escitalopram (LEXAPRO) 10 MG tablet      Other Visit Diagnoses    Diabetes mellitus without complication ([place])    -  Primary    Relevant Orders    Comprehensive metabolic panel (Completed)    Microalbumin / creatinine urine ratio    Hemoglobin A1c (Completed)    Amb Referral to Nutrition and Diabetic E    Pernicious anemia        Relevant Orders    CBC with Differential/Platelet (Completed)    B12 (Completed)    Methylmalonic Acid    Dysphagia        Relevant Orders    Ambulatory referral to Gastroenterology      A total of 25 minutes was spent with patient more than half of which  was spent in counseling patient on the above mentioned issues , reviewing and explaining recent labs and imaging studies done, and coordination of care.   I have discontinued Ms. Berch's magnesium oxide. I am also having her start on escitalopram. Additionally, I am having her maintain her cholecalciferol, aspirin, nitroGLYCERIN, HYDROcodone-acetaminophen, isosorbide mononitrate, dicyclomine, clonazePAM, rosuvastatin, metoprolol tartrate, omeprazole, and buPROPion.  Meds ordered this encounter  Medications  . escitalopram (LEXAPRO) 10 MG tablet    Sig: Take 1 tablet (10 mg total) by mouth daily.    Dispense:  30 tablet    Refill:  5    Medications Discontinued During This Encounter  Medication Reason  . magnesium oxide (MAG-OX) 400 (241.3 Mg) MG tablet     Follow-up: Return in about 4 weeks (around 06/13/2016).   Crecencio Mc, MD

## 2016-05-16 NOTE — Patient Instructions (Signed)
Please start the Lexapro (escitalopram) at 1/2 tablet daily in the evening for the first few days to avoid nausea.  You can increase to a full tablet after 4 days if you havenot developed side effects of nausea.  If the lexapro interferes with your sleep, take it in the morning instead  Please return in  4 weeks  continue the clonazepam but take it at 9 or 10 pm  Referral to Dr Epimenio Foot for endoscopy, and referral to Diabetes Education is in process

## 2016-05-18 NOTE — Assessment & Plan Note (Signed)
New diagnosis due = in May with fasting glucose of 134.  Confirmed with  A1c, diet and exercise discussed briefly, referral to Diabetes Education.  Lab Results  Component Value Date   HGBA1C 6.9* 05/16/2016

## 2016-05-18 NOTE — Assessment & Plan Note (Addendum)
Her myriad somatic symptoms appear to have resolved but she continues to suffer from insomnia. Trial of lexapro to imporve outlook and address insomnia

## 2016-05-18 NOTE — Assessment & Plan Note (Addendum)
Chronic, normocytic,  rechecking B12/folate and iron studies.  Refer to dr Epimenio Foot fo EGD.  Colonoscopy  was normal less than 10 months ago,  Will recheck given use of PPI.  Consider secondary to CKD.  Lab Results  Component Value Date   WBC 4.6 05/16/2016   HGB 10.6* 05/16/2016   HCT 31.4* 05/16/2016   MCV 83.6 05/16/2016   PLT 219.0 05/16/2016   Lab Results  Component Value Date   IRON 66 03/07/2016   TIBC 291 03/07/2016   FERRITIN 93.1 03/07/2016   Lab Results  Component Value Date   VITAMINB12 472 05/16/2016   No results found for: FOLATE

## 2016-05-19 ENCOUNTER — Encounter: Payer: Self-pay | Admitting: Internal Medicine

## 2016-05-19 LAB — METHYLMALONIC ACID, SERUM: Methylmalonic Acid, Quant: 275 nmol/L (ref 87–318)

## 2016-05-22 ENCOUNTER — Other Ambulatory Visit: Payer: Medicare Other

## 2016-05-22 DIAGNOSIS — E119 Type 2 diabetes mellitus without complications: Secondary | ICD-10-CM

## 2016-05-22 LAB — MICROALBUMIN / CREATININE URINE RATIO
CREATININE, U: 161.8 mg/dL
MICROALB/CREAT RATIO: 2.3 mg/g (ref 0.0–30.0)
Microalb, Ur: 3.7 mg/dL — ABNORMAL HIGH (ref 0.0–1.9)

## 2016-05-22 NOTE — Addendum Note (Signed)
Addended by: Leeanne Rio on: 05/22/2016 02:29 PM   Modules accepted: Orders

## 2016-05-25 ENCOUNTER — Telehealth: Payer: Self-pay | Admitting: Internal Medicine

## 2016-05-25 NOTE — Telephone Encounter (Signed)
I spoke with the patient on the phone, reviewed the email, she understood the importance factor, I have mailed her a copy of the letter and lab and advised she can return a call to me if she has further instructions. thanks

## 2016-05-25 NOTE — Telephone Encounter (Signed)
pateint has not read her e mail about her labs

## 2016-06-05 NOTE — Telephone Encounter (Signed)
Mailed unread message to patient, thanks 

## 2016-06-06 ENCOUNTER — Encounter: Payer: Self-pay | Admitting: Dietician

## 2016-06-06 ENCOUNTER — Encounter: Payer: Medicare Other | Attending: Internal Medicine | Admitting: Dietician

## 2016-06-06 VITALS — BP 120/68 | Ht 65.0 in | Wt 160.4 lb

## 2016-06-06 DIAGNOSIS — Z713 Dietary counseling and surveillance: Secondary | ICD-10-CM | POA: Diagnosis not present

## 2016-06-06 DIAGNOSIS — E119 Type 2 diabetes mellitus without complications: Secondary | ICD-10-CM | POA: Insufficient documentation

## 2016-06-06 DIAGNOSIS — Z6826 Body mass index (BMI) 26.0-26.9, adult: Secondary | ICD-10-CM | POA: Diagnosis not present

## 2016-06-06 NOTE — Progress Notes (Signed)
Diabetes Self-Management Education  Visit Type: First/Initial  Appt. Start Time: 1100 Appt. End Time: 1215  06/06/2016  Ms. Pamelia Mcgurl, identified by name and date of birth, is a 75 y.o. female with a diagnosis of Diabetes: Type 2.   ASSESSMENT  Blood pressure 120/68, height 5\' 5"  (1.651 m), weight 160 lb 6.4 oz (72.8 kg). Body mass index is 26.69 kg/m.      Diabetes Self-Management Education - 06/06/16 1633      Visit Information   Visit Type First/Initial     Initial Visit   Diabetes Type Type 2     Health Coping   How would you rate your overall health? Good     Psychosocial Assessment   Patient Belief/Attitude about Diabetes Motivated to manage diabetes   Patient Concerns Monitoring;Healthy Lifestyle;Problem Solving;Glycemic Control;Weight Control;Medication   Special Needs None   Preferred Learning Style Auditory;Visual;Hands on   Learning Readiness Ready   What is the last grade level you completed in school? 15     Pre-Education Assessment   Patient understands the diabetes disease and treatment process. Needs Instruction   Patient understands incorporating nutritional management into lifestyle. Needs Instruction   Patient undertands incorporating physical activity into lifestyle. Needs Instruction   Patient understands using medications safely. Needs Instruction   Patient understands monitoring blood glucose, interpreting and using results Needs Instruction   Patient understands prevention, detection, and treatment of acute complications. Needs Instruction   Patient understands prevention, detection, and treatment of chronic complications. Needs Instruction   Patient understands how to develop strategies to address psychosocial issues. Needs Instruction   Patient understands how to develop strategies to promote health/change behavior. Needs Instruction     Complications   Last HgB A1C per patient/outside source 6.9 %  05-16-16   How often do you check your blood  sugar? 0 times/day (not testing)   Have you had a dilated eye exam in the past 12 months? Yes  02-09-16   Have you had a dental exam in the past 12 months? Yes  03-22-16   Are you checking your feet? Yes   How many days per week are you checking your feet? 7     Dietary Intake   Breakfast --  eats 3 meals/day   Lunch --  eats fried foods 4-5x/wk and desserts/sweets daily   Snack (evening) --  eats bedtime snack-snack foods and sweets   Beverage(s) --  drinks occasional sweet tea, water 2-3x/day and diet drinks 4-5x/day     Exercise   Exercise Type ADL's-exercise limited due to back pain     Patient Education   Previous Diabetes Education No   Disease state  Definition of diabetes, type 1 and 2, and the diagnosis of diabetes;Factors that contribute to the development of diabetes;Explored patient's options for treatment of their diabetes   Nutrition management  Role of diet in the treatment of diabetes and the relationship between the three main macronutrients and blood glucose level;Food label reading, portion sizes and measuring food.;Carbohydrate counting   Physical activity and exercise  Role of exercise on diabetes management, blood pressure control and cardiac health.;Helped patient identify appropriate exercises in relation to his/her diabetes, diabetes complications and other health issue.   Monitoring Taught/evaluated SMBG meter.;Purpose and frequency of SMBG.;Taught/discussed recording of test results and interpretation of SMBG.;Identified appropriate SMBG and/or A1C goals.;Yearly dilated eye exam  gave pt One Touch Verio Flex and instructed on its use-BG 132 (2 1/2 hr pp)   Chronic complications Relationship  between chronic complications and blood glucose control;Dental care;Retinopathy and reason for yearly dilated eye exams   Psychosocial adjustment Role of stress on diabetes   Personal strategies to promote health Lifestyle issues that need to be addressed for better diabetes  care;Helped patient develop diabetes management plan for (enter comment)     Outcomes   Expected Outcomes Demonstrated interest in learning. Expect positive outcomes      Individualized Plan for Diabetes Self-Management Training:   Learning Objective:  Patient will have a greater understanding of diabetes self-management. Patient education plan is to attend individual and/or group sessions per assessed needs and concerns.   Plan:   Patient Instructions   Check blood sugars 2 x day before breakfast and 2 hrs after supper every day Exercise:  Try exercises in Workout To GO handout  15 min. 3x/wk. Avoid sugar sweetened drinks (soda, tea, coffee, sports drinks, juices) Limit intake of sweets and fried foods and snack foods Make healthy food choices Eat 3 meals day, and 1  small snack a day at bedtime Include 2-3 carbohydrate servings/meal + protein Include 1 carbohydrate serving/snack + protein Space meals 4-6 hours apart Bring blood sugar records to the next appointment/class Call your doctor for a prescription for:  1. Meter strips (type)  One Touch Verio test strips  checking  2 times per day  2. Lancets (type) One Touch Delica Lancets  checking 2   times per day Get a Sharps container Call Dr. Derrel Nip to clarify  Lexapro and Buspar Return for appointment/classes on:  07-03-16   Expected Outcomes:  Demonstrated interest in learning. Expect positive outcomes  Education material provided: general meal planning guidelines, One Touch Verio Flex meter  If problems or questions, patient to contact team via:  [phone]  Future DSME appointment:   07-03-16

## 2016-06-06 NOTE — Patient Instructions (Signed)
  Check blood sugars 2 x day before breakfast and 2 hrs after supper every day Exercise:  Try exercises in Workout To GO handout  15 min. 3x/wk. Avoid sugar sweetened drinks (soda, tea, coffee, sports drinks, juices) Limit intake of sweets and fried foods and snack foods Make healthy food choices Eat 3 meals day, and 1  small snack a day at bedtime Include 2-3 carbohydrate servings/meal + protein Include 1 carbohydrate serving/snack + protein Space meals 4-6 hours apart Bring blood sugar records to the next appointment/class Call your doctor for a prescription for:  1. Meter strips (type)  One Touch Verio test strips  checking  2 times per day  2. Lancets (type) One Touch Delica Lancets  checking 2   times per day Get a Sharps container Call Dr. Derrel Nip to clarify  Lexapro and Buspar Return for appointment/classes on:  07-03-16

## 2016-06-07 ENCOUNTER — Telehealth: Payer: Self-pay | Admitting: Internal Medicine

## 2016-06-07 NOTE — Telephone Encounter (Signed)
Pt called about needing a refill for escitalopram (LEXAPRO) 10 MG tablet.  Pharmacy is [facility], [place].  Call pt @ [phone]. Thank you!

## 2016-06-07 NOTE — Telephone Encounter (Signed)
Left message for patient that she has 5 refills at the pharmacy still, called and confirmed with the pharmacy.

## 2016-06-12 ENCOUNTER — Telehealth: Payer: Self-pay | Admitting: *Deleted

## 2016-06-12 MED ORDER — ONETOUCH LANCETS MISC: 5 refills | Status: DC

## 2016-06-12 MED ORDER — GLUCOSE BLOOD VI STRP: ORAL_STRIP | 5 refills | Status: DC

## 2016-06-12 NOTE — Telephone Encounter (Signed)
Patient requested Rx for  lancets and strips. She has a one touch.  Pt contact [phone]

## 2016-06-12 NOTE — Telephone Encounter (Signed)
Need to know what pharmacy to send to

## 2016-06-12 NOTE — Telephone Encounter (Signed)
The pharmacy is [facility], [place] would like a 90 day supply.

## 2016-06-19 ENCOUNTER — Ambulatory Visit (INDEPENDENT_AMBULATORY_CARE_PROVIDER_SITE_OTHER): Payer: Medicare Other | Admitting: Internal Medicine

## 2016-06-19 ENCOUNTER — Encounter: Payer: Self-pay | Admitting: Internal Medicine

## 2016-06-19 DIAGNOSIS — M5432 Sciatica, left side: Secondary | ICD-10-CM

## 2016-06-19 DIAGNOSIS — F4381 Prolonged grief disorder: Secondary | ICD-10-CM

## 2016-06-19 DIAGNOSIS — F4321 Adjustment disorder with depressed mood: Secondary | ICD-10-CM | POA: Diagnosis not present

## 2016-06-19 DIAGNOSIS — E785 Hyperlipidemia, unspecified: Secondary | ICD-10-CM

## 2016-06-19 DIAGNOSIS — R1314 Dysphagia, pharyngoesophageal phase: Secondary | ICD-10-CM | POA: Diagnosis not present

## 2016-06-19 DIAGNOSIS — F4329 Adjustment disorder with other symptoms: Secondary | ICD-10-CM

## 2016-06-19 DIAGNOSIS — Z79899 Other long term (current) drug therapy: Secondary | ICD-10-CM | POA: Diagnosis not present

## 2016-06-19 NOTE — Progress Notes (Signed)
Pre visit review using our clinic review tool, if applicable. No additional management support is needed unless otherwise documented below in the visit note. 

## 2016-06-19 NOTE — Patient Instructions (Addendum)
There are several low carb ice creams  That you can have DAILY!   -Breyer's Carb smart (Fudsicles,  Ice cream bars and scoopable ice cream)   -Halo Top  Pint sized  Ice  cream    -E Enlightenment ice cream  Bars (only at [employer])    You no NOT need medications for diabetes or depression at this tine !  We will repeat your A1c on or after October 11th .    You do not have to check sugars daily any more.  Just check 2 hours after a particularly yummy mela to see if that meal is taboo. If BS I > 165 after that meal. ,  Go for a walk !  This will lower your sugars by 15-20 points

## 2016-06-19 NOTE — Progress Notes (Signed)
Subjective:  Patient ID: Sabrina Montoya, female    DOB: 02-06-1941  Age: 75 y.o. MRN: [id number]  CC: Diagnoses of Prolonged grief reaction, Hyperlipidemia, Dysphagia, pharyngoesophageal phase, and Sciatica of left side were pertinent to this visit.  HPI Sabrina Montoya presents for follow up on depression and new onset Type 2 Diabetes mellitus .  Was seen in July,  lexapro prescribed,  But per patient  [facility] did not receive it so she has not been taking any.   Checked with [facility] and rx was received on July 11 but patient never picked it up.  Patient continues to disagree with information provided.   Referred to Dr. Epimenio Foot last month  for dysphagia , says she Never got a call,  Still having dysphagia for solids, and chiked on  [place] .  FALLS ASLEEP DURING THE DAY  For 1-2 hours Then gets busy doing things and finds herself still active and working at  2:30 am  Using clonazepam only in the morning for anxiety .  Not using it at night   Has been Referred to [facility] for new onset Diabetes Mellitus .  Did not like the one touch glucometer they provided and obtained another one from pharmacy that has cheaper prices on test strips Checking sugars twice daily.  95% are under 160.  She has  Has altered diet ,  Reducing starches   Has been having more back pain  Lately,  Much worse acc'd by an episode dizziness which occurred during a recent walk, denies chest pain and shortness of  Breath.  Was seeing Dr Burman Riis in [facility] for back pain  Who has now  moved to [place] . Was told by Dr Burman Riis that her previous lumbar fusion  has apparently "come loose. "  Pain starts in the buttock and radiates to   Left hip.  received a a heating pad,  Hasn't seen hin in over a year and will need referral [place]   Outpatient Medications Prior to Visit  Medication Sig Dispense Refill  . aspirin 81 MG tablet Take 81 mg by mouth daily.    Marland Kitchen buPROPion (WELLBUTRIN XL) 150 MG 24 hr tablet TAKE  1 TABLET BY MOUTH EACH MORNING. 30 tablet 4  . cholecalciferol (VITAMIN D) 1000 UNITS tablet Take 1,000 Units by mouth daily.    . clonazePAM (KLONOPIN) 0.5 MG tablet TAKE 1 TABLET BY MOUTH TWICE DAILY AS NEEDED. (Patient taking differently: take 1/2 tablet once daily) 60 tablet 5  . dicyclomine (BENTYL) 20 MG tablet Take 1 tablet (20 mg total) by mouth 2 (two) times daily. 60 tablet 11  . glucose blood test strip Use as instructed to test blood sugars with One Touch Glucometer. Dx: E11.9 200 each 5  . HYDROcodone-acetaminophen (NORCO/VICODIN) 5-325 MG per tablet Take 1 tablet by mouth every 6 (six) hours as needed for moderate pain. 60 tablet 0  . isosorbide mononitrate (IMDUR) 30 MG 24 hr tablet Take 30 mg by mouth daily.    . metoprolol tartrate (LOPRESSOR) 25 MG tablet TAKE 1/2 TABLET BY MOUTH TWICE DAILY 30 tablet 4  . nitroGLYCERIN (NITROSTAT) 0.4 MG SL tablet Place 1 tablet (0.4 mg total) under the tongue every 5 (five) minutes as needed. As needed for chest pain 10 tablet 1  . omeprazole (PRILOSEC) 20 MG capsule TAKE 1 CAPSULE BY MOUTH ONCE DAILY. 30 capsule 4  . ONE TOUCH LANCETS MISC Use as directed  to test blood sugars with One Touch Glucometer.  Dx: E11.9 200 each 5  . rosuvastatin (CRESTOR) 40 MG tablet TAKE 1 TABLET BY MOUTH ONCE DAILY 30 tablet 5  . escitalopram (LEXAPRO) 10 MG tablet Take 1 tablet (10 mg total) by mouth daily. (Patient not taking: Reported on 06/06/2016) 30 tablet 5   No facility-administered medications prior to visit.     Review of Systems;  Patient denies headache, fevers, malaise, unintentional weight loss, skin rash, eye pain, sinus congestion and sinus pain, sore throat, dysphagia,  hemoptysis , cough, dyspnea, wheezing, chest pain, palpitations, orthopnea, edema, abdominal pain, nausea, melena, diarrhea, constipation, flank pain, dysuria, hematuria, urinary  Frequency, nocturia, numbness, tingling, seizures,  Focal weakness, Loss of consciousness,  Tremor,  insomnia, depression, anxiety, and suicidal ideation.      Objective:  BP 128/64   Pulse 63   Temp 98.6 F (37 C)   Resp 18   Wt 158 lb (71.7 kg)   BMI 26.29 kg/m   BP Readings from Last 3 Encounters:  06/19/16 128/64  06/06/16 120/68  05/16/16 110/62    Wt Readings from Last 3 Encounters:  06/19/16 158 lb (71.7 kg)  06/06/16 160 lb 6.4 oz (72.8 kg)  05/16/16 160 lb 12 oz (72.9 kg)    General appearance: alert, cooperative and appears stated age Ears: normal TM's and external ear canals both ears Throat: lips, mucosa, and tongue normal; teeth and gums normal Neck: no adenopathy, no carotid bruit, supple, symmetrical, trachea midline and thyroid not enlarged, symmetric, no tenderness/mass/nodules Back: symmetric, no curvature. ROM normal. No CVA tenderness. Lungs: clear to auscultation bilaterally Heart: regular rate and rhythm, S1, S2 normal, no murmur, click, rub or gallop Abdomen: soft, non-tender; bowel sounds normal; no masses,  no organomegaly Pulses: 2+ and symmetric Skin: Skin color, texture, turgor normal. No rashes or lesions Lymph nodes: Cervical, supraclavicular, and axillary nodes normal.  Lab Results  Component Value Date   HGBA1C 6.9 (H) 05/16/2016   HGBA1C 6.0 (H) 09/11/2013    Lab Results  Component Value Date   CREATININE 1.25 (H) 05/16/2016   CREATININE 1.07 03/07/2016   CREATININE 1.11 (H) 08/09/2015    Lab Results  Component Value Date   WBC 4.6 05/16/2016   HGB 10.6 (L) 05/16/2016   HCT 31.4 (L) 05/16/2016   PLT 219.0 05/16/2016   GLUCOSE 143 (H) 05/16/2016   CHOL 109 03/07/2016   TRIG 164.0 (H) 03/07/2016   HDL 36.60 (L) 03/07/2016   LDLDIRECT 79.0 07/01/2013   LDLCALC 40 03/07/2016   ALT 21 05/16/2016   AST 35 05/16/2016   NA 140 05/16/2016   K 4.3 05/16/2016   CL 106 05/16/2016   CREATININE 1.25 (H) 05/16/2016   BUN 29 (H) 05/16/2016   CO2 26 05/16/2016   TSH 1.79 03/07/2016   HGBA1C 6.9 (H) 05/16/2016   MICROALBUR 3.7  (H) 05/22/2016    No results found.  Assessment & Plan:   Problem List Items Addressed This Visit    Hyperlipidemia    She is overdue for fasting labs and ;lfts.  Taking Crestor 40 mg daily due to history of CAD   Lab Results  Component Value Date   CHOL 109 03/07/2016   HDL 36.60 (L) 03/07/2016   LDLCALC 40 03/07/2016   LDLDIRECT 79.0 07/01/2013   TRIG 164.0 (H) 03/07/2016   CHOLHDL 3 03/07/2016   Lab Results  Component Value Date   ALT 21 05/16/2016   AST 35 05/16/2016  ALKPHOS 72 05/16/2016   BILITOT 0.5 05/16/2016           Prolonged grief reaction    lexapro was prescribed but never started,  Affect and outlook are much brighter today,  No medication needed at this time.       Dysphagia, pharyngoesophageal phase    Referral made to prior GI at last visit but patient never received a call from office .  Patient brought to referral coordinator to [place] in real time.       Sciatica of left side    Patient needs to follow up with Dr Mickeal Needy who now practices in [place].       Relevant Orders   Ambulatory referral to Orthopedic Surgery    Other Visit Diagnoses   None.     I have discontinued Ms. Bradner's escitalopram. I am also having her maintain her cholecalciferol, aspirin, nitroGLYCERIN, HYDROcodone-acetaminophen, isosorbide mononitrate, dicyclomine, clonazePAM, rosuvastatin, metoprolol tartrate, omeprazole, buPROPion, glucose blood, and ONE TOUCH LANCETS.  No orders of the defined types were placed in this encounter.   Medications Discontinued During This Encounter  Medication Reason  . escitalopram (LEXAPRO) 10 MG tablet     Follow-up: Return for follow up diabetes, on or after Oct 11 .   Crecencio Mc, MD

## 2016-06-20 DIAGNOSIS — M5432 Sciatica, left side: Secondary | ICD-10-CM | POA: Insufficient documentation

## 2016-06-20 DIAGNOSIS — R1314 Dysphagia, pharyngoesophageal phase: Secondary | ICD-10-CM | POA: Insufficient documentation

## 2016-06-20 NOTE — Assessment & Plan Note (Signed)
Patient needs to follow up with Dr Mickeal Needy who now practices in [place].

## 2016-06-20 NOTE — Assessment & Plan Note (Signed)
Referral made to prior GI at last visit but patient never received a call from office .  Patient brought to referral coordinator to [facility] in real time.

## 2016-06-20 NOTE — Assessment & Plan Note (Signed)
New diagnosis, well-controlled on diet alone .   Patient is up-to-date on eye exams and foot exam is normal today. Patient has no microalbuminuria. Patient is tolerating statin therapy for CAD risk reduction and on ACE/ARB for renal protection and hypertension .  She has attended diabetes education classess.  Does not need to check sugars twice daily anymore.  Return on October   Lab Results  Component Value Date   HGBA1C 6.9 (H) 05/16/2016

## 2016-06-20 NOTE — Assessment & Plan Note (Signed)
lexapro was prescribed but never started,  Affect and outlook are much brighter today,  No medication needed at this time.

## 2016-06-20 NOTE — Assessment & Plan Note (Signed)
She is overdue for fasting labs and ;lfts.  Taking Crestor 40 mg daily due to history of CAD   Lab Results  Component Value Date   CHOL 109 03/07/2016   HDL 36.60 (L) 03/07/2016   LDLCALC 40 03/07/2016   LDLDIRECT 79.0 07/01/2013   TRIG 164.0 (H) 03/07/2016   CHOLHDL 3 03/07/2016   Lab Results  Component Value Date   ALT 21 05/16/2016   AST 35 05/16/2016   ALKPHOS 72 05/16/2016   BILITOT 0.5 05/16/2016

## 2016-06-21 ENCOUNTER — Telehealth: Payer: Self-pay | Admitting: Internal Medicine

## 2016-06-21 NOTE — Telephone Encounter (Signed)
error 

## 2016-06-23 ENCOUNTER — Telehealth: Payer: Self-pay | Admitting: Internal Medicine

## 2016-06-23 NOTE — Telephone Encounter (Signed)
Pt called wanting to know if she still need to go to attend any of the classes being that she is not keeping record of her blood sugar? Pt also wants to know why Dr Derrel Nip stated she did not have to record blood sugar anymore?  Call pt @ [phone]. Please leave a msg if no answer. Thank you!

## 2016-06-23 NOTE — Telephone Encounter (Signed)
Please advise 

## 2016-06-26 NOTE — Telephone Encounter (Signed)
Patient notified and voiced understanding.

## 2016-06-26 NOTE — Telephone Encounter (Signed)
Patient would like to know if she still needs to attend DM management classes? And why she idoes not have to check CBG's any longer. In note from last visit was because DM is well controlled ?

## 2016-06-26 NOTE — Telephone Encounter (Signed)
She was told she could stop checking daily bc her a1c had so improved, but if she prefers to keep checking once daily, she is welcome to do so. Attending the DM management classes is up to her. If she still feels she is learning things she didn't know,  She should continue  Lab Results  Component Value Date   HGBA1C 6.9 (H) 05/16/2016

## 2016-07-03 ENCOUNTER — Ambulatory Visit: Payer: Medicare Other

## 2016-07-04 ENCOUNTER — Telehealth: Payer: Self-pay | Admitting: Dietician

## 2016-07-04 NOTE — Telephone Encounter (Signed)
Called patient regarding class she missed on 07/03/16. She had left a message stating she was ill and unable to attend. Left her a message with next available class date and time (07/31/16 for evening class).

## 2016-07-17 ENCOUNTER — Ambulatory Visit: Payer: Medicare Other

## 2016-07-20 DIAGNOSIS — H2513 Age-related nuclear cataract, bilateral: Secondary | ICD-10-CM | POA: Diagnosis not present

## 2016-07-24 ENCOUNTER — Ambulatory Visit: Payer: Medicare Other

## 2016-08-01 ENCOUNTER — Telehealth: Payer: Self-pay | Admitting: Dietician

## 2016-08-01 NOTE — Telephone Encounter (Signed)
Have not heard from pt-called pt-rescheduled classes beginning on 08-28-16 series.

## 2016-08-03 DIAGNOSIS — Z6826 Body mass index (BMI) 26.0-26.9, adult: Secondary | ICD-10-CM | POA: Diagnosis not present

## 2016-08-03 DIAGNOSIS — M545 Low back pain: Secondary | ICD-10-CM | POA: Diagnosis not present

## 2016-08-11 ENCOUNTER — Telehealth: Payer: Self-pay

## 2016-08-11 DIAGNOSIS — E782 Mixed hyperlipidemia: Secondary | ICD-10-CM

## 2016-08-11 DIAGNOSIS — E119 Type 2 diabetes mellitus without complications: Secondary | ICD-10-CM

## 2016-08-11 DIAGNOSIS — E559 Vitamin D deficiency, unspecified: Secondary | ICD-10-CM

## 2016-08-11 NOTE — Telephone Encounter (Signed)
Pt is scheduled for labs on 08/14/16. I don't see any future orders, but from review of chart it looks like she is having her A1c checked. Pt is technically not due until 08/16/16 for A1c check. Do you want [place] to call patient and re-schedule lab appointment for the 11th or after?

## 2016-08-11 NOTE — Telephone Encounter (Signed)
Called and advised patient. Pt said she has an appointment with Dr. Derrel Nip on 10/11; she will just have lab drawn during that visit.

## 2016-08-11 NOTE — Telephone Encounter (Signed)
Yes, thank you Callie.  I'll order the labs for after oct 11

## 2016-08-14 ENCOUNTER — Other Ambulatory Visit: Payer: Medicare Other

## 2016-08-16 ENCOUNTER — Ambulatory Visit (INDEPENDENT_AMBULATORY_CARE_PROVIDER_SITE_OTHER): Payer: Medicare Other | Admitting: Internal Medicine

## 2016-08-16 DIAGNOSIS — F329 Major depressive disorder, single episode, unspecified: Secondary | ICD-10-CM | POA: Diagnosis not present

## 2016-08-16 DIAGNOSIS — E119 Type 2 diabetes mellitus without complications: Secondary | ICD-10-CM | POA: Diagnosis not present

## 2016-08-16 DIAGNOSIS — F32A Depression, unspecified: Secondary | ICD-10-CM

## 2016-08-16 DIAGNOSIS — F4381 Prolonged grief disorder: Secondary | ICD-10-CM

## 2016-08-16 DIAGNOSIS — Z23 Encounter for immunization: Secondary | ICD-10-CM | POA: Diagnosis not present

## 2016-08-16 DIAGNOSIS — F4329 Adjustment disorder with other symptoms: Secondary | ICD-10-CM

## 2016-08-16 DIAGNOSIS — F450 Somatization disorder: Secondary | ICD-10-CM

## 2016-08-16 MED ORDER — SERTRALINE HCL 25 MG PO TABS: 25.0000 mg | ORAL_TABLET | Freq: Every day | ORAL | 1 refills | Status: DC

## 2016-08-16 NOTE — Progress Notes (Signed)
Subjective:  Patient ID: Sabrina Montoya, female    DOB: 1941/05/20  Age: 75 y.o. MRN: [id number]  CC: Diagnoses of Encounter for immunization, Prolonged grief reaction, Type 2 diabetes mellitus without complication, without long-term current use of insulin ([place]), and Depression with somatization were pertinent to this visit.  HPI [name] presents for follow up on newly diagnosed type 2 DM with a1c 6.9  At last visit several 3 months ago.  Has been increasingly more active since last visit.  Still develops dsypnea with exertion with activoities such as walking to the end of the road  .  Still falling asleep if she sits down for more than 10 minutes.  Has reduced the clonazepam doose to 1/2 tab in am / takes the evening dose before dinner   Still adjusting to being alone.  Sometimes has no human interaction for a week at a time .  Social circles have decreased.  Her  2 daughers live in [place] but both work full time sometimes goes a week without other social contact.   Frustrated by daughters' constantly judging her decisions after the fact  Rather than being available to assist in the process   Back pain starting to bother her again.  Saw Dr Burman Riis recently. Has not started PT yet,  Wants to find an exercise facility with a pool       Outpatient Medications Prior to Visit  Medication Sig Dispense Refill  . aspirin 81 MG tablet Take 81 mg by mouth daily.    Marland Kitchen buPROPion (WELLBUTRIN XL) 150 MG 24 hr tablet TAKE 1 TABLET BY MOUTH EACH MORNING. 30 tablet 4  . cholecalciferol (VITAMIN D) 1000 UNITS tablet Take 1,000 Units by mouth daily.    . clonazePAM (KLONOPIN) 0.5 MG tablet TAKE 1 TABLET BY MOUTH TWICE DAILY AS NEEDED. (Patient taking differently: take 1/2 tablet once daily) 60 tablet 5  . dicyclomine (BENTYL) 20 MG tablet Take 1 tablet (20 mg total) by mouth 2 (two) times daily. 60 tablet 11  . glucose blood test strip Use as instructed to test blood sugars with One Touch Glucometer. Dx: E11.9 200 each  5  . isosorbide mononitrate (IMDUR) 30 MG 24 hr tablet Take 30 mg by mouth daily.    . metoprolol tartrate (LOPRESSOR) 25 MG tablet TAKE 1/2 TABLET BY MOUTH TWICE DAILY 30 tablet 4  . nitroGLYCERIN (NITROSTAT) 0.4 MG SL tablet Place 1 tablet (0.4 mg total) under the tongue every 5 (five) minutes as needed. As needed for chest pain 10 tablet 1  . omeprazole (PRILOSEC) 20 MG capsule TAKE 1 CAPSULE BY MOUTH ONCE DAILY. 30 capsule 4  . ONE TOUCH LANCETS MISC Use as directed to test blood sugars with One Touch Glucometer.  Dx: E11.9 200 each 5  . rosuvastatin (CRESTOR) 40 MG tablet TAKE 1 TABLET BY MOUTH ONCE DAILY 30 tablet 5  . HYDROcodone-acetaminophen (NORCO/VICODIN) 5-325 MG per tablet Take 1 tablet by mouth every 6 (six) hours as needed for moderate pain. (Patient not taking: Reported on 08/16/2016) 60 tablet 0   No facility-administered medications prior to visit.     Review of Systems;  Patient denies headache, fevers, malaise, unintentional weight loss, skin rash, eye pain, sinus congestion and sinus pain, sore throat, dysphagia,  hemoptysis , cough, dyspnea, wheezing, chest pain, palpitations, orthopnea, edema, abdominal pain, nausea, melena, diarrhea, constipation, flank pain, dysuria, hematuria, urinary  Frequency, nocturia, numbness, tingling, seizures,  Focal weakness, Loss of consciousness,  Tremor, insomnia,  depression, anxiety, and suicidal ideation.      Objective:  BP 132/78   Pulse 64   Temp 98.4 F (36.9 C) (Oral)   Resp 12   Ht 5\' 5"  (1.651 m)   Wt 161 lb 8 oz (73.3 kg)   SpO2 97%   BMI 26.88 kg/m   BP Readings from Last 3 Encounters:  08/16/16 132/78  06/19/16 128/64  06/06/16 120/68    Wt Readings from Last 3 Encounters:  08/16/16 161 lb 8 oz (73.3 kg)  06/19/16 158 lb (71.7 kg)  06/06/16 160 lb 6.4 oz (72.8 kg)    General appearance: alert, cooperative and appears stated age Ears: normal TM's and external ear canals both ears Throat: lips, mucosa, and  tongue normal; teeth and gums normal Neck: no adenopathy, no carotid bruit, supple, symmetrical, trachea midline and thyroid not enlarged, symmetric, no tenderness/mass/nodules Back: symmetric, no curvature. ROM normal. No CVA tenderness. Lungs: clear to auscultation bilaterally Heart: regular rate and rhythm, S1, S2 normal, no murmur, click, rub or gallop Abdomen: soft, non-tender; bowel sounds normal; no masses,  no organomegaly Pulses: 2+ and symmetric Skin: Skin color, texture, turgor normal. No rashes or lesions Lymph nodes: Cervical, supraclavicular, and axillary nodes normal.  Lab Results  Component Value Date   HGBA1C 6.9 (H) 05/16/2016   HGBA1C 6.0 (H) 09/11/2013    Lab Results  Component Value Date   CREATININE 1.25 (H) 05/16/2016   CREATININE 1.07 03/07/2016   CREATININE 1.11 (H) 08/09/2015    Lab Results  Component Value Date   WBC 4.6 05/16/2016   HGB 10.6 (L) 05/16/2016   HCT 31.4 (L) 05/16/2016   PLT 219.0 05/16/2016   GLUCOSE 143 (H) 05/16/2016   CHOL 109 03/07/2016   TRIG 164.0 (H) 03/07/2016   HDL 36.60 (L) 03/07/2016   LDLDIRECT 79.0 07/01/2013   LDLCALC 40 03/07/2016   ALT 21 05/16/2016   AST 35 05/16/2016   NA 140 05/16/2016   K 4.3 05/16/2016   CL 106 05/16/2016   CREATININE 1.25 (H) 05/16/2016   BUN 29 (H) 05/16/2016   CO2 26 05/16/2016   TSH 1.79 03/07/2016   HGBA1C 6.9 (H) 05/16/2016   MICROALBUR 3.7 (H) 05/22/2016    No results found.  Assessment & Plan:   Problem List Items Addressed This Visit    Prolonged grief reaction    Patient is dealing with the unexpected loss of spouse and has adequate coping skills and emotional support .  i have asked patient  to return in one month after the medication changes outlined today to examine for signs of unresolving grief.       Depression with somatization    Starting lsertraline and gradually reducing clonazepam use to just at bedtime       Relevant Medications   sertraline (ZOLOFT) 25  MG tablet   Type II diabetes mellitus ([place])    New diagnosis, well-controlled on diet alone .   Patient is up-to-date on eye exams and foot exam is normal today. Patient has no microalbuminuria. Patient is tolerating statin therapy for CAD risk reduction and on ACE/ARB for renal protection and hypertension .  She has attended diabetes education classess.  Does not need to check sugars twice daily anymore.  Return on October   Lab Results  Component Value Date   HGBA1C 6.9 (H) 05/16/2016          Other Visit Diagnoses    Encounter for immunization  Relevant Orders   Flu vaccine HIGH DOSE PF (Completed)    A total of 25 minutes of face to face time was spent with patient more than half of which was spent in counselling about the above mentioned conditions  and coordination of care   I am having Ms. Mcerlean start on sertraline. I am also having her maintain her cholecalciferol, aspirin, nitroGLYCERIN, HYDROcodone-acetaminophen, isosorbide mononitrate, dicyclomine, clonazePAM, rosuvastatin, metoprolol tartrate, omeprazole, buPROPion, glucose blood, and ONE TOUCH LANCETS.  Meds ordered this encounter  Medications  . sertraline (ZOLOFT) 25 MG tablet    Sig: Take 1 tablet (25 mg total) by mouth daily. At bedtime 9    Dispense:  90 tablet    Refill:  1    There are no discontinued medications.  Follow-up: Return in about 4 weeks (around 09/13/2016), or NOT BEFORE NOV 13,  FASTING LABS NEXT WEEK .   Crecencio Mc, MD

## 2016-08-16 NOTE — Patient Instructions (Addendum)
I recommend starting the sertraline at 25 mg daily at bedtime.  In one week Stop the morning clonazepam .   Beginning Week 2, reduce the pre dinner clonazepam to 1/2 tablet   Starting Week 3,  Increase the sertraline to 50 mg at bedtime  Week 4,  Stop the dinner clonazepam completely   RTC  TO SEE ME Week 5     You need to enforce some structure ,  Try this:   No TV in bed   No I phones  ipads or laptops after 8 pm    In bed by 11 pm  Out of bed by 7 a m   WALK EVERY DAY GOAL IS 30 MINUTES   RETURN FOR LABS NEXT WEEK

## 2016-08-16 NOTE — Progress Notes (Signed)
Pre-visit discussion using our clinic review tool. No additional management support is needed unless otherwise documented below in the visit note.  

## 2016-08-17 NOTE — Assessment & Plan Note (Signed)
New diagnosis, well-controlled on diet alone .   Patient is up-to-date on eye exams and foot exam is normal today. Patient has no microalbuminuria. Patient is tolerating statin therapy for CAD risk reduction and on ACE/ARB for renal protection and hypertension .  She has attended diabetes education classess.  Does not need to check sugars twice daily anymore.  Return on October   Lab Results  Component Value Date   HGBA1C 6.9 (H) 05/16/2016

## 2016-08-17 NOTE — Assessment & Plan Note (Signed)
Patient is dealing with the unexpected loss of spouse and has adequate coping skills and emotional support .  i have asked patient  to return in one month after the medication changes outlined today to examine for signs of unresolving grief.

## 2016-08-17 NOTE — Assessment & Plan Note (Signed)
Starting lsertraline and gradually reducing clonazepam use to just at bedtime

## 2016-08-21 DIAGNOSIS — R1310 Dysphagia, unspecified: Secondary | ICD-10-CM | POA: Diagnosis not present

## 2016-08-22 ENCOUNTER — Other Ambulatory Visit (INDEPENDENT_AMBULATORY_CARE_PROVIDER_SITE_OTHER): Payer: Medicare Other

## 2016-08-22 DIAGNOSIS — E559 Vitamin D deficiency, unspecified: Secondary | ICD-10-CM | POA: Diagnosis not present

## 2016-08-22 DIAGNOSIS — E782 Mixed hyperlipidemia: Secondary | ICD-10-CM

## 2016-08-22 DIAGNOSIS — E119 Type 2 diabetes mellitus without complications: Secondary | ICD-10-CM

## 2016-08-22 LAB — COMPREHENSIVE METABOLIC PANEL
ALT: 20 U/L (ref 0–35)
AST: 30 U/L (ref 0–37)
Albumin: 4.5 g/dL (ref 3.5–5.2)
Alkaline Phosphatase: 64 U/L (ref 39–117)
BILIRUBIN TOTAL: 0.3 mg/dL (ref 0.2–1.2)
BUN: 33 mg/dL — ABNORMAL HIGH (ref 6–23)
CALCIUM: 9.6 mg/dL (ref 8.4–10.5)
CHLORIDE: 106 meq/L (ref 96–112)
CO2: 30 meq/L (ref 19–32)
Creatinine, Ser: 1.44 mg/dL — ABNORMAL HIGH (ref 0.40–1.20)
GFR: 37.79 mL/min — AB (ref >60.00)
Glucose, Bld: 138 mg/dL — ABNORMAL HIGH (ref 70–99)
Potassium: 4.7 mEq/L (ref 3.5–5.1)
Sodium: 143 mEq/L (ref 135–145)
Total Protein: 7.3 g/dL (ref 6.0–8.3)

## 2016-08-22 LAB — LIPID PANEL
CHOLESTEROL: 112 mg/dL (ref 0–200)
HDL: 34.4 mg/dL — ABNORMAL LOW (ref >39.00)
LDL Cholesterol: 47 mg/dL (ref 0–99)
NonHDL: 77.4
Total CHOL/HDL Ratio: 3
Triglycerides: 154 mg/dL — ABNORMAL HIGH (ref 0.0–149.0)
VLDL: 30.8 mg/dL (ref 0.0–40.0)

## 2016-08-22 LAB — MICROALBUMIN / CREATININE URINE RATIO
CREATININE, U: 94.4 mg/dL
MICROALB/CREAT RATIO: 0.7 mg/g (ref 0.0–30.0)

## 2016-08-22 LAB — HEMOGLOBIN A1C: HEMOGLOBIN A1C: 6.9 % — AB (ref 4.6–6.5)

## 2016-08-22 LAB — VITAMIN D 25 HYDROXY (VIT D DEFICIENCY, FRACTURES): VITD: 65.73 ng/mL (ref 30.00–100.00)

## 2016-08-22 LAB — LDL CHOLESTEROL, DIRECT: LDL DIRECT: 55 mg/dL

## 2016-08-24 ENCOUNTER — Encounter: Payer: Self-pay | Admitting: Internal Medicine

## 2016-08-24 ENCOUNTER — Other Ambulatory Visit: Payer: Self-pay | Admitting: Internal Medicine

## 2016-08-24 DIAGNOSIS — E1121 Type 2 diabetes mellitus with diabetic nephropathy: Secondary | ICD-10-CM | POA: Insufficient documentation

## 2016-08-24 DIAGNOSIS — N183 Chronic kidney disease, stage 3 unspecified: Secondary | ICD-10-CM

## 2016-08-24 DIAGNOSIS — R944 Abnormal results of kidney function studies: Secondary | ICD-10-CM | POA: Insufficient documentation

## 2016-08-25 ENCOUNTER — Other Ambulatory Visit: Payer: Medicare Other

## 2016-08-28 ENCOUNTER — Ambulatory Visit: Payer: Medicare Other

## 2016-08-28 ENCOUNTER — Telehealth: Payer: Self-pay | Admitting: Internal Medicine

## 2016-08-28 NOTE — Telephone Encounter (Signed)
Left message for patient to return call to office. 

## 2016-08-28 NOTE — Telephone Encounter (Signed)
Pt called and stated that for the last few days she has been having some hallucinations and feeling off balance. She feels that it is the new medication sertraline (ZOLOFT) 25 MG tablet, she started taking the medication on 10/11. Please advise, thank you!  Call pt @ [phone] (may leave a message on machine)

## 2016-08-28 NOTE — Telephone Encounter (Signed)
Tell her to stop the zoloft. And let us know if the hallucinations stop

## 2016-08-29 NOTE — Telephone Encounter (Signed)
Patient notified and voiced understanding.

## 2016-08-29 NOTE — Telephone Encounter (Signed)
Pt called returning your call.  Call pt @ [phone]

## 2016-08-29 NOTE — Telephone Encounter (Signed)
Left message to call office on home phone and cell.

## 2016-09-01 ENCOUNTER — Telehealth: Payer: Self-pay | Admitting: *Deleted

## 2016-09-01 NOTE — Telephone Encounter (Signed)
Patient has been made aware.

## 2016-09-01 NOTE — Telephone Encounter (Signed)
Patient has been informed.

## 2016-09-01 NOTE — Telephone Encounter (Signed)
Pt has requested lab result  Results can be left on voicemail Contact [phone]

## 2016-09-01 NOTE — Telephone Encounter (Signed)
Pt requested a explanation for having to be referred out to Dr. I[facility] contact [phone]

## 2016-09-04 ENCOUNTER — Other Ambulatory Visit: Payer: Self-pay

## 2016-09-04 ENCOUNTER — Telehealth: Payer: Self-pay | Admitting: Dietician

## 2016-09-04 ENCOUNTER — Ambulatory Visit: Payer: Medicare Other

## 2016-09-04 DIAGNOSIS — K317 Polyp of stomach and duodenum: Secondary | ICD-10-CM | POA: Diagnosis not present

## 2016-09-04 DIAGNOSIS — R1314 Dysphagia, pharyngoesophageal phase: Secondary | ICD-10-CM | POA: Diagnosis not present

## 2016-09-04 DIAGNOSIS — K222 Esophageal obstruction: Secondary | ICD-10-CM | POA: Diagnosis not present

## 2016-09-04 DIAGNOSIS — K210 Gastro-esophageal reflux disease with esophagitis: Secondary | ICD-10-CM | POA: Diagnosis not present

## 2016-09-04 DIAGNOSIS — K3189 Other diseases of stomach and duodenum: Secondary | ICD-10-CM | POA: Diagnosis not present

## 2016-09-04 NOTE — Telephone Encounter (Signed)
Called patient to reschedule class series; she cancelled class 1 on 08/28/16 due to inclement weather. In speaking with her, she states she has a number of medical appointments and issues she is dealing with, and needs to delay diabetes classes for a time. She rescheduled class 1 for 11/13/16.

## 2016-09-04 NOTE — Telephone Encounter (Signed)
Last filled 03/03/16  Last office visit 08/16/2016  Next office visit 09/02/16

## 2016-09-05 MED ORDER — CLONAZEPAM 0.5 MG PO TABS: 0.5000 mg | ORAL_TABLET | Freq: Every day | ORAL | 5 refills | Status: DC

## 2016-09-05 NOTE — Telephone Encounter (Signed)
Patient advised of below and verbalized an understanding  

## 2016-09-05 NOTE — Telephone Encounter (Signed)
Remind patient that dose was to be reduced to just at bedtime,  So the refill will reflect reduction to #30/month

## 2016-09-07 ENCOUNTER — Other Ambulatory Visit (INDEPENDENT_AMBULATORY_CARE_PROVIDER_SITE_OTHER): Payer: Self-pay | Admitting: Vascular Surgery

## 2016-09-07 DIAGNOSIS — M79604 Pain in right leg: Secondary | ICD-10-CM

## 2016-09-07 DIAGNOSIS — M79605 Pain in left leg: Secondary | ICD-10-CM

## 2016-09-07 DIAGNOSIS — I714 Abdominal aortic aneurysm, without rupture, unspecified: Secondary | ICD-10-CM

## 2016-09-07 NOTE — Telephone Encounter (Signed)
Mailed unread message to patient. thanks 

## 2016-09-08 ENCOUNTER — Ambulatory Visit (INDEPENDENT_AMBULATORY_CARE_PROVIDER_SITE_OTHER): Payer: Medicare Other

## 2016-09-08 ENCOUNTER — Ambulatory Visit (INDEPENDENT_AMBULATORY_CARE_PROVIDER_SITE_OTHER): Payer: Medicare Other | Admitting: Vascular Surgery

## 2016-09-08 ENCOUNTER — Encounter (INDEPENDENT_AMBULATORY_CARE_PROVIDER_SITE_OTHER): Payer: Self-pay | Admitting: Vascular Surgery

## 2016-09-08 VITALS — BP 126/69 | HR 69 | Resp 16 | Ht 65.0 in | Wt 161.0 lb

## 2016-09-08 DIAGNOSIS — I714 Abdominal aortic aneurysm, without rupture, unspecified: Secondary | ICD-10-CM

## 2016-09-08 DIAGNOSIS — M79605 Pain in left leg: Secondary | ICD-10-CM

## 2016-09-08 DIAGNOSIS — E782 Mixed hyperlipidemia: Secondary | ICD-10-CM | POA: Diagnosis not present

## 2016-09-08 DIAGNOSIS — E119 Type 2 diabetes mellitus without complications: Secondary | ICD-10-CM

## 2016-09-08 DIAGNOSIS — M79604 Pain in right leg: Secondary | ICD-10-CM

## 2016-09-08 NOTE — Progress Notes (Signed)
Subjective:    Patient ID: Sabrina Montoya, female    DOB: 18-Feb-1941, 75 y.o.   MRN: [id number] Chief Complaint  Patient presents with  . Re-evaluation    Ultrasound follow up   Patient presents for a six month AAA follow up. She underwent an aortic duplex which was notable for an abdominal aortic aneurysm measuring 3.81cm AP x 3.76cm transverse (previous 3.8cm on 03/07/16). He denies any symptoms such as back pain, pulsatile abdominal masses or thrombosis in his extremities. His hypertension is adequately controlled and he has remained abstinent of tobacco use. The patient underwent an ABI which showed Right ABI: 0.95 and Left 1.04 (no previous for comparison). The patient denies any claudication like symptoms, rest pain or ulcers to her feet / toes.    Review of Systems  Constitutional: Negative.   HENT: Negative.   Eyes: Negative.   Respiratory: Negative.   Cardiovascular: Negative.   Gastrointestinal: Negative.   Endocrine: Negative.   Genitourinary: Negative.   Musculoskeletal: Negative.   Skin: Negative.   Allergic/Immunologic: Negative.   Neurological: Negative.   Hematological: Negative.   Psychiatric/Behavioral: Negative.       Objective:   Physical Exam  Constitutional: She is oriented to person, place, and time. She appears well-developed and well-nourished.  HENT:  Head: Normocephalic and atraumatic.  Eyes: Conjunctivae and EOM are normal. Pupils are equal, round, and reactive to light.  Neck: Normal range of motion.  Cardiovascular: Normal rate, regular rhythm, normal heart sounds and intact distal pulses.   Pulses:      Radial pulses are 2+ on the right side, and 2+ on the left side.       Dorsalis pedis pulses are 2+ on the right side, and 2+ on the left side.       Posterior tibial pulses are 2+ on the right side, and 2+ on the left side.  Pulmonary/Chest: Effort normal and breath sounds normal.  Abdominal: Soft. Bowel sounds are normal. She exhibits no  distension. There is no tenderness. There is no rebound.  Musculoskeletal: Normal range of motion. She exhibits no edema.  Neurological: She is alert and oriented to person, place, and time.  Skin: Skin is warm and dry.  Psychiatric: She has a normal mood and affect. Her behavior is normal. Judgment and thought content normal.   BP 126/69 (BP Location: Left Arm)   Pulse 69   Resp 16   Ht 5\' 5"  (1.651 m)   Wt 73 kg (161 lb)   BMI 26.79 kg/m   Past Medical History:  Diagnosis Date  . Abdominal aortic aneurysm without mention of rupture    infrarenal, stable, folllowed by Leotis Pain  . Acute posthemorrhagic anemia   . B12 deficiency   . CAD (coronary artery disease), autologous vein bypass graft   . Cardiac dysrhythmia, unspecified   . Colon polyp   . Diverticulitis of colon   . GERD (gastroesophageal reflux disease)   . Hyperlipidemia   . IBS (irritable bowel syndrome)   . Neuropathy ([place])   . Peripheral vascular disease ([place])    s/p CEA   . Reflux esophagitis   . Rheumatic fever    possible at age 58  . Sliding hiatal hernia   . Tobacco abuse   . Tobacco abuse    Social History   Social History  . Marital status: Widowed    Spouse name: N/A  . Number of children: N/A  . Years of education: N/A  Occupational History  . Not on file.   Social History Main Topics  . Smoking status: Former Smoker    Packs/day: 0.50    Years: 30.00    Types: Cigarettes    Quit date: 04/01/2012  . Smokeless tobacco: Never Used     Comment: quit for 2 years after sinus infection and 6 months after heart surgery  . Alcohol use No  . Drug use: No  . Sexual activity: Not Currently   Other Topics Concern  . Not on file   Social History Narrative   Lives with spouse [name]   Past Surgical History:  Procedure Laterality Date  . ABDOMINAL SURGERY  1976   for pain secondary to scar tissue, s/p apply  . APPENDECTOMY  1974  . CEA     Carotid stenosis found during workup for  dysphagia,  [facility]  . 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 CHOLECYSTECTOMY  2002   Dr. Pat Patrick  . CLOSED MANIPULATION SHOULDER     left shoulder post physical therapy, Right shoulder redo (toggle bolts)  . COLONOSCOPY  09/01/14  . coronary angiography  01/2011   one occluded artery with collateralization, other arteries patent  . CORONARY ARTERY BYPASS GRAFT  2006   s/p triple bypass surgery, [facility]  . heart catherization  2016  . HERNIA REPAIR  may 2011   Dr. Pat Patrick  . hernia repair    . LUMBAR DISC SURGERY  1971   L5, unssuccessful, fusion in 1983 successful (2 lumbar)  . ROTATOR CUFF REPAIR  2002   right shoulder Dr. Nadean Corwin, [place] in [facility]   Family History  Problem Relation Age of Onset  . Cancer Sister     cervical cancer  . Diabetes Sister   . Heart disease Brother     coronary artery disease  . Other Brother     suicide  . Diabetes Mother   . Heart disease Mother   . Diabetes Sister   . Other Father     suicide  . Cancer Sister     breast  . Other Other     colon resection due to inflammation -nephew   Allergies  Allergen Reactions  . Niacin And Related Hives  . Sulfa Drugs Cross Reactors Nausea Only  . Tetracyclines & Related Hives  . Latex Rash  . Simvastatin Rash    *Antihyperlipidemics*; elevated LFT's.       Assessment & Plan:   1. Abdominal aortic aneurysm (AAA) without rupture ([place]) - Stable Studies reviewed with patient. Duplex stable, physical exam unremarkable. No surgery or intervention at this time. The patient to follow up in six months with an ABI, aortic duplex and bilateral lower extremity arterial duplex. The patient has an asymptomatic abdominal aortic aneurysm that is less than 4 cm in maximal diameter.  I have reviewed the natural history of abdominal aortic aneurysm and the small risk of rupture for aneurysm less than 5 cm in size.  However, as these small  aneurysms tend to enlarge over time, continued surveillance with ultrasound or CT scan is mandatory.  The patient's blood pressure is being adequately controlled however I have reviewed the importance of hypertension and lipid control and the importance of continuing his abstinence from tobacco.  The patient is also encouraged to exercise a minimum of 30 minutes 4 times a week.  Should the patient develop new onset abdominal or back pain or signs  of peripheral embolization they are instructed to seek medical attention immediately and to alert the physician providing care that they have an aneurysm.  The patient voices their understanding.  2. Type 2 diabetes mellitus without complication, without long-term current use of insulin (HCC) - Stable Encouraged good control as its slows the progression of atherosclerotic disease  3. Mixed hyperlipidemia - Stable On ASA and statin for medical optimization. Encouraged good control as its slows the progression of atherosclerotic disease   Current Outpatient Prescriptions on File Prior to Visit  Medication Sig Dispense Refill  . aspirin 81 MG tablet Take 81 mg by mouth daily.    Marland Kitchen buPROPion (WELLBUTRIN XL) 150 MG 24 hr tablet TAKE 1 TABLET BY MOUTH EACH MORNING. 30 tablet 4  . cholecalciferol (VITAMIN D) 1000 UNITS tablet Take 1,000 Units by mouth daily.    . clonazePAM (KLONOPIN) 0.5 MG tablet Take 1 tablet (0.5 mg total) by mouth at bedtime. 30 tablet 5  . dicyclomine (BENTYL) 20 MG tablet Take 1 tablet (20 mg total) by mouth 2 (two) times daily. 60 tablet 11  . glucose blood test strip Use as instructed to test blood sugars with One Touch Glucometer. Dx: E11.9 200 each 5  . isosorbide mononitrate (IMDUR) 30 MG 24 hr tablet Take 30 mg by mouth daily.    . metoprolol tartrate (LOPRESSOR) 25 MG tablet TAKE 1/2 TABLET BY MOUTH TWICE DAILY 30 tablet 4  . nitroGLYCERIN (NITROSTAT) 0.4 MG SL tablet Place 1 tablet (0.4 mg total) under the tongue every 5 (five)  minutes as needed. As needed for chest pain 10 tablet 1  . omeprazole (PRILOSEC) 20 MG capsule TAKE 1 CAPSULE BY MOUTH ONCE DAILY. 30 capsule 4  . ONE TOUCH LANCETS MISC Use as directed to test blood sugars with One Touch Glucometer.  Dx: E11.9 200 each 5  . rosuvastatin (CRESTOR) 40 MG tablet TAKE 1 TABLET BY MOUTH ONCE DAILY 30 tablet 5   No current facility-administered medications on file prior to visit.     There are no Patient Instructions on file for this visit. No Follow-up on file.   KIMBERLY A STEGMAYER, PA-C

## 2016-09-11 ENCOUNTER — Ambulatory Visit: Payer: Medicare Other

## 2016-09-13 DIAGNOSIS — M545 Low back pain: Secondary | ICD-10-CM | POA: Diagnosis not present

## 2016-09-14 ENCOUNTER — Telehealth: Payer: Self-pay | Admitting: *Deleted

## 2016-09-14 NOTE — Telephone Encounter (Signed)
FYI Pt wanted Dr.Tullo to know that she received her letter and has a scheduled appt with  [facility] on 10-20-16.

## 2016-09-14 NOTE — Telephone Encounter (Signed)
Please advise 

## 2016-09-26 ENCOUNTER — Encounter: Payer: Self-pay | Admitting: Internal Medicine

## 2016-09-26 DIAGNOSIS — Z1212 Encounter for screening for malignant neoplasm of rectum: Secondary | ICD-10-CM | POA: Diagnosis not present

## 2016-09-26 DIAGNOSIS — Z1211 Encounter for screening for malignant neoplasm of colon: Secondary | ICD-10-CM | POA: Diagnosis not present

## 2016-10-03 ENCOUNTER — Encounter: Payer: Self-pay | Admitting: Internal Medicine

## 2016-10-03 ENCOUNTER — Ambulatory Visit (INDEPENDENT_AMBULATORY_CARE_PROVIDER_SITE_OTHER): Payer: Medicare Other | Admitting: Internal Medicine

## 2016-10-03 VITALS — BP 132/72 | HR 69 | Temp 98.8°F | Resp 12 | Ht 65.0 in | Wt 159.5 lb

## 2016-10-03 DIAGNOSIS — F329 Major depressive disorder, single episode, unspecified: Secondary | ICD-10-CM

## 2016-10-03 DIAGNOSIS — R221 Localized swelling, mass and lump, neck: Secondary | ICD-10-CM | POA: Diagnosis not present

## 2016-10-03 DIAGNOSIS — N183 Chronic kidney disease, stage 3 unspecified: Secondary | ICD-10-CM

## 2016-10-03 DIAGNOSIS — E1122 Type 2 diabetes mellitus with diabetic chronic kidney disease: Secondary | ICD-10-CM

## 2016-10-03 DIAGNOSIS — F450 Somatization disorder: Secondary | ICD-10-CM

## 2016-10-03 DIAGNOSIS — N182 Chronic kidney disease, stage 2 (mild): Secondary | ICD-10-CM

## 2016-10-03 DIAGNOSIS — I6523 Occlusion and stenosis of bilateral carotid arteries: Secondary | ICD-10-CM

## 2016-10-03 NOTE — Patient Instructions (Signed)
Your mammogram was ordered in March,  Please see Melissa to have it rescheduled.  Carotid ultrasound has been ordered to evaluate the swelling on your left side   Please call the nephrologist to see if they can move up your appointment  Your next A1c is due on or after Nov 23, 2016

## 2016-10-03 NOTE — Progress Notes (Signed)
Subjective:  Patient ID: Sabrina Montoya, female    DOB: [birth year]  Age: 75 y.o. MRN: [id number]  CC: The primary encounter diagnosis was Neck swelling. Diagnoses of Diabetes mellitus with stage 2 chronic kidney disease ([place]), Depression with somatization, CKD (chronic kidney disease) stage 3, GFR 30-59 ml/min, and Asymptomatic bilateral carotid artery stenosis were also pertinent to this visit.  HPI Sabrina Montoya presents for follow up  depression with anxiety .  1) Depression with anxiety: stopped sertraline  After 4 days due to recurrent frightening visual hallucinations that occurred nightly.   Finally stopped it and the hallucinations stopped . Using 1/2 tab of clonazepam just at bedtime.  Daytime fatigue has resolved since stopping the morniing clonazepam and she does not feel anxious during the day.    Had flu shot.   Diarrhea has stopped ,  Now having 2-3 stools daily , both formed and liquid.  had endoscopy  In October for dysphagia.   CKD  Diagnosis discussed   Overdue for mammogram .  Was ordered in March,  Was never called    Left side of neck appeared swollen last night first noticed.  Denies pain .  History of left CEA in 2007 at [facility] . Last doppler was May 2017 no aneurysms noted, stenosis was 40-50% range.    Lab Results  Component Value Date   HGBA1C 6.9 (H) 08/22/2016   Lab Results  Component Value Date   MICROALBUR <0.7 08/22/2016     Outpatient Medications Prior to Visit  Medication Sig Dispense Refill  . aspirin 81 MG tablet Take 81 mg by mouth daily.    . cholecalciferol (VITAMIN D) 1000 UNITS tablet Take 1,000 Units by mouth daily.    . clonazePAM (KLONOPIN) 0.5 MG tablet Take 1 tablet (0.5 mg total) by mouth at bedtime. 30 tablet 5  . dicyclomine (BENTYL) 20 MG tablet Take 1 tablet (20 mg total) by mouth 2 (two) times daily. 60 tablet 11  . glucose blood test strip Use as instructed to test blood sugars with One Touch Glucometer. Dx: E11.9 200 each 5  .  isosorbide mononitrate (IMDUR) 30 MG 24 hr tablet Take 30 mg by mouth daily.    . nitroGLYCERIN (NITROSTAT) 0.4 MG SL tablet Place 1 tablet (0.4 mg total) under the tongue every 5 (five) minutes as needed. As needed for chest pain 10 tablet 1  . omeprazole (PRILOSEC) 20 MG capsule TAKE 1 CAPSULE BY MOUTH ONCE DAILY. 30 capsule 4  . ONE TOUCH LANCETS MISC Use as directed to test blood sugars with One Touch Glucometer.  Dx: E11.9 200 each 5  . buPROPion (WELLBUTRIN XL) 150 MG 24 hr tablet TAKE 1 TABLET BY MOUTH EACH MORNING. 30 tablet 4  . metoprolol tartrate (LOPRESSOR) 25 MG tablet TAKE 1/2 TABLET BY MOUTH TWICE DAILY 30 tablet 4  . rosuvastatin (CRESTOR) 40 MG tablet TAKE 1 TABLET BY MOUTH ONCE DAILY 30 tablet 5   No facility-administered medications prior to visit.     Review of Systems;  Patient denies headache, fevers, malaise, unintentional weight loss, skin rash, eye pain, sinus congestion and sinus pain, sore throat, dysphagia,  hemoptysis , cough, dyspnea, wheezing, chest pain, palpitations, orthopnea, edema, abdominal pain, nausea, melena, diarrhea, constipation, flank pain, dysuria, hematuria, urinary  Frequency, nocturia, numbness, tingling, seizures,  Focal weakness, Loss of consciousness,  Tremor, insomnia, depression, anxiety, and suicidal ideation.      Objective:  BP 132/72   Pulse 69  Temp 98.8 F (37.1 C) (Oral)   Resp 12   Ht 5\' 5"  (1.651 m)   Wt 159 lb 8 oz (72.3 kg)   SpO2 98%   BMI 26.54 kg/m   BP Readings from Last 3 Encounters:  10/03/16 132/72  09/08/16 126/69  08/16/16 132/78    Wt Readings from Last 3 Encounters:  10/03/16 159 lb 8 oz (72.3 kg)  09/08/16 161 lb (73 kg)  08/16/16 161 lb 8 oz (73.3 kg)    General appearance: alert, cooperative and appears stated age Ears: normal TM's and external ear canals both ears Throat: lips, mucosa, and tongue normal; teeth and gums normal Neck: no adenopathy, no carotid bruit, supple, symmetrical, trachea  midline and thyroid not enlarged, symmetric, no tenderness/mass/nodules Back: symmetric, no curvature. ROM normal. No CVA tenderness. Lungs: clear to auscultation bilaterally Heart: regular rate and rhythm, S1, S2 normal, no murmur, click, rub or gallop Abdomen: soft, non-tender; bowel sounds normal; no masses,  no organomegaly Pulses: 2+ and symmetric Skin: Skin color, texture, turgor normal. No rashes or lesions Lymph nodes: Cervical, supraclavicular, and axillary nodes normal.  Lab Results  Component Value Date   HGBA1C 6.9 (H) 08/22/2016   HGBA1C 6.9 (H) 05/16/2016   HGBA1C 6.0 (H) 09/11/2013    Lab Results  Component Value Date   CREATININE 1.44 (H) 08/22/2016   CREATININE 1.25 (H) 05/16/2016   CREATININE 1.07 03/07/2016    Lab Results  Component Value Date   WBC 4.6 05/16/2016   HGB 10.6 (L) 05/16/2016   HCT 31.4 (L) 05/16/2016   PLT 219.0 05/16/2016   GLUCOSE 138 (H) 08/22/2016   CHOL 112 08/22/2016   TRIG 154.0 (H) 08/22/2016   HDL 34.40 (L) 08/22/2016   LDLDIRECT 55.0 08/22/2016   LDLCALC 47 08/22/2016   ALT 20 08/22/2016   AST 30 08/22/2016   NA 143 08/22/2016   K 4.7 08/22/2016   CL 106 08/22/2016   CREATININE 1.44 (H) 08/22/2016   BUN 33 (H) 08/22/2016   CO2 30 08/22/2016   TSH 1.79 03/07/2016   HGBA1C 6.9 (H) 08/22/2016   MICROALBUR <0.7 08/22/2016    No results found.  Assessment & Plan:   Problem List Items Addressed This Visit    Asymptomatic carotid artery stenosis    With prior left CEA.  2007 at [facility] last doppler May 2017  40-50% on Left 50-69% on right.       CKD (chronic kidney disease) stage 3, GFR 30-59 ml/min    Renal function has not returned to baseline despite  avoidance of NSAIDs.  She is on an ARB for control of hypertension,, and statin for control of hyperlipidemia. Nephrology referral in progress  Lab Results  Component Value Date   CREATININE 1.44 (H) 08/22/2016   Lab Results  Component Value Date   NA 143 08/22/2016     K 4.7 08/22/2016   CL 106 08/22/2016   CO2 30 08/22/2016   Lab Results  Component Value Date   MICROALBUR <0.7 08/22/2016         Depression with somatization    She did not tolerate trial of sertraline 25 mg due to visual hallucinations.  Symptoms have improved since stopping morning clonazepam. She does not want to try alternative therapy       Diabetes mellitus with stage 2 chronic kidney disease ([place])    Remains  well-controlled on diet alone but A1c is rising Patient is up-to-date on eye exams and foot exam has been  done today. Patient has no microalbuminuria. Patient is tolerating statin therapy for CAD risk reduction and on ACE/ARB for renal protection and hypertension .    Lab Results  Component Value Date   HGBA1C 6.9 (H) 08/22/2016   Lab Results  Component Value Date   MICROALBUR <0.7 08/22/2016   Lab Results  Component Value Date   CREATININE 1.44 (H) 08/22/2016         Neck swelling - Primary    Given her history or CEA on left ,  Hypertension and aortic aneurysm, will repeat carotid ultrasound to rule out aneurysm.       Relevant Orders   VAS US CAROTID    A total of 25 minutes of face to face time was spent with patient more than half of which was spent in counselling about the above mentioned conditions  and coordination of care   I am having Sabrina Montoya maintain her cholecalciferol, aspirin, nitroGLYCERIN, isosorbide mononitrate, dicyclomine, omeprazole, glucose blood, ONE TOUCH LANCETS, and clonazePAM.  No orders of the defined types were placed in this encounter.   There are no discontinued medications.  Follow-up: Return for follow up diabetes on or after jan 19.   Sabrina Montoya, Aris Everts, MD

## 2016-10-03 NOTE — Progress Notes (Signed)
Pre-visit discussion using our clinic review tool. No additional management support is needed unless otherwise documented below in the visit note.  

## 2016-10-05 ENCOUNTER — Other Ambulatory Visit
Admission: RE | Admit: 2016-10-05 | Discharge: 2016-10-05 | Disposition: A | Discharge status: Home / Self Care | Payer: Medicare Other | Source: Ambulatory Visit | Attending: Ophthalmology | Admitting: Ophthalmology

## 2016-10-05 ENCOUNTER — Other Ambulatory Visit: Payer: Self-pay | Admitting: Internal Medicine

## 2016-10-05 ENCOUNTER — Encounter: Payer: Self-pay | Admitting: Internal Medicine

## 2016-10-05 DIAGNOSIS — Z01812 Encounter for preprocedural laboratory examination: Secondary | ICD-10-CM | POA: Insufficient documentation

## 2016-10-05 DIAGNOSIS — H2513 Age-related nuclear cataract, bilateral: Secondary | ICD-10-CM | POA: Insufficient documentation

## 2016-10-05 DIAGNOSIS — Z1231 Encounter for screening mammogram for malignant neoplasm of breast: Secondary | ICD-10-CM | POA: Diagnosis not present

## 2016-10-05 DIAGNOSIS — R221 Localized swelling, mass and lump, neck: Secondary | ICD-10-CM | POA: Insufficient documentation

## 2016-10-05 LAB — HM MAMMOGRAPHY

## 2016-10-05 NOTE — Assessment & Plan Note (Signed)
With prior left CEA.  2007 at [facility].  last doppler May 2017  40-50% on Left 50-69% on right.  

## 2016-10-05 NOTE — Assessment & Plan Note (Addendum)
Renal function has not returned to baseline despite  avoidance of NSAIDs.  She is on an ARB for control of hypertension,, and statin for control of hyperlipidemia. Nephrology referral in progress  Lab Results  Component Value Date   CREATININE 1.44 (H) 08/22/2016   Lab Results  Component Value Date   NA 143 08/22/2016   K 4.7 08/22/2016   CL 106 08/22/2016   CO2 30 08/22/2016   Lab Results  Component Value Date   MICROALBUR <0.7 08/22/2016

## 2016-10-05 NOTE — Assessment & Plan Note (Addendum)
She did not tolerate trial of sertraline 25 mg due to visual hallucinations.  Symptoms have improved since stopping morning clonazepam. She does not want to try alternative therapy

## 2016-10-05 NOTE — Assessment & Plan Note (Signed)
Remains  well-controlled on diet alone but A1c is rising Patient is up-to-date on eye exams and foot exam has been done today. Patient has no microalbuminuria. Patient is tolerating statin therapy for CAD risk reduction and on ACE/ARB for renal protection and hypertension .    Lab Results  Component Value Date   HGBA1C 6.9 (H) 08/22/2016   Lab Results  Component Value Date   MICROALBUR <0.7 08/22/2016   Lab Results  Component Value Date   CREATININE 1.44 (H) 08/22/2016

## 2016-10-05 NOTE — Assessment & Plan Note (Signed)
Given her history or CEA on left ,  Hypertension and aortic aneurysm, will repeat carotid ultrasound to rule out aneurysm.

## 2016-10-06 LAB — COLOGUARD: COLOGUARD: NEGATIVE

## 2016-10-08 ENCOUNTER — Telehealth: Payer: Self-pay | Admitting: Internal Medicine

## 2016-10-08 LAB — MISC LABCORP TEST (SEND OUT): Labcorp test code: 670654

## 2016-10-08 NOTE — Telephone Encounter (Signed)
The results of your cologuard test were negative.   We will repeat this every 3 years  Until you are 86 for colon CA screening.  

## 2016-10-09 ENCOUNTER — Encounter: Payer: Self-pay | Admitting: *Deleted

## 2016-10-09 NOTE — Telephone Encounter (Signed)
Letter mailed

## 2016-10-10 ENCOUNTER — Encounter: Payer: Self-pay | Admitting: *Deleted

## 2016-10-16 NOTE — H&P (Signed)
See scanned note.

## 2016-10-18 ENCOUNTER — Encounter: Payer: Self-pay | Admitting: *Deleted

## 2016-10-18 ENCOUNTER — Ambulatory Visit: Payer: Medicare Other | Admitting: Certified Registered Nurse Anesthetist

## 2016-10-18 ENCOUNTER — Ambulatory Visit
Admission: RE | Admit: 2016-10-18 | Discharge: 2016-10-18 | Disposition: A | Discharge status: Home / Self Care | Payer: Medicare Other | Source: Ambulatory Visit | Attending: Ophthalmology | Admitting: Ophthalmology

## 2016-10-18 ENCOUNTER — Encounter
Admission: RE | Disposition: A | Discharge status: Home / Self Care | Payer: Self-pay | Source: Ambulatory Visit | Attending: Ophthalmology

## 2016-10-18 DIAGNOSIS — N289 Disorder of kidney and ureter, unspecified: Secondary | ICD-10-CM | POA: Insufficient documentation

## 2016-10-18 DIAGNOSIS — K449 Diaphragmatic hernia without obstruction or gangrene: Secondary | ICD-10-CM | POA: Insufficient documentation

## 2016-10-18 DIAGNOSIS — H2511 Age-related nuclear cataract, right eye: Secondary | ICD-10-CM | POA: Diagnosis not present

## 2016-10-18 DIAGNOSIS — I2510 Atherosclerotic heart disease of native coronary artery without angina pectoris: Secondary | ICD-10-CM | POA: Insufficient documentation

## 2016-10-18 DIAGNOSIS — K219 Gastro-esophageal reflux disease without esophagitis: Secondary | ICD-10-CM | POA: Diagnosis not present

## 2016-10-18 DIAGNOSIS — Z8673 Personal history of transient ischemic attack (TIA), and cerebral infarction without residual deficits: Secondary | ICD-10-CM | POA: Insufficient documentation

## 2016-10-18 DIAGNOSIS — D649 Anemia, unspecified: Secondary | ICD-10-CM | POA: Diagnosis not present

## 2016-10-18 DIAGNOSIS — Z79899 Other long term (current) drug therapy: Secondary | ICD-10-CM | POA: Insufficient documentation

## 2016-10-18 DIAGNOSIS — G709 Myoneural disorder, unspecified: Secondary | ICD-10-CM | POA: Diagnosis not present

## 2016-10-18 DIAGNOSIS — I10 Essential (primary) hypertension: Secondary | ICD-10-CM | POA: Insufficient documentation

## 2016-10-18 DIAGNOSIS — E1136 Type 2 diabetes mellitus with diabetic cataract: Secondary | ICD-10-CM | POA: Diagnosis not present

## 2016-10-18 DIAGNOSIS — Z87891 Personal history of nicotine dependence: Secondary | ICD-10-CM | POA: Diagnosis not present

## 2016-10-18 DIAGNOSIS — I739 Peripheral vascular disease, unspecified: Secondary | ICD-10-CM | POA: Insufficient documentation

## 2016-10-18 DIAGNOSIS — H2513 Age-related nuclear cataract, bilateral: Secondary | ICD-10-CM | POA: Diagnosis not present

## 2016-10-18 HISTORY — DX: Cardiac arrhythmia, unspecified: I49.9

## 2016-10-18 HISTORY — DX: Dizziness and giddiness: R42

## 2016-10-18 HISTORY — DX: Depression, unspecified: F32.A

## 2016-10-18 HISTORY — PX: CATARACT EXTRACTION W/PHACO: SHX586

## 2016-10-18 HISTORY — DX: Essential (primary) hypertension: I10

## 2016-10-18 HISTORY — DX: Unspecified hearing loss, unspecified ear: H91.90

## 2016-10-18 HISTORY — DX: Major depressive disorder, single episode, unspecified: F32.9

## 2016-10-18 HISTORY — DX: Unspecified osteoarthritis, unspecified site: M19.90

## 2016-10-18 LAB — GLUCOSE, CAPILLARY: GLUCOSE-CAPILLARY: 116 mg/dL — AB (ref 65–99)

## 2016-10-18 SURGERY — PHACOEMULSIFICATION, CATARACT, WITH IOL INSERTION
Anesthesia: Monitor Anesthesia Care | Site: Eye | Laterality: Right | Wound class: Clean

## 2016-10-18 MED ORDER — CEFUROXIME OPHTHALMIC INJECTION 1 MG/0.1 ML
INJECTION | OPHTHALMIC | Status: DC | PRN
  Administered 2016-10-18: 1 mg via INTRACAMERAL

## 2016-10-18 MED ORDER — CYCLOPENTOLATE HCL 2 % OP SOLN
OPHTHALMIC | Status: AC
  Filled 2016-10-18: qty 2

## 2016-10-18 MED ORDER — EPINEPHRINE PF 1 MG/ML IJ SOLN
INTRAOCULAR | Status: DC | PRN
  Administered 2016-10-18: 200 mL via OPHTHALMIC

## 2016-10-18 MED ORDER — POVIDONE-IODINE 5 % OP SOLN
OPHTHALMIC | Status: DC | PRN
  Administered 2016-10-18: 1 via OPHTHALMIC

## 2016-10-18 MED ORDER — PHENYLEPHRINE HCL 10 % OP SOLN
OPHTHALMIC | Status: AC
  Filled 2016-10-18: qty 5

## 2016-10-18 MED ORDER — MOXIFLOXACIN HCL 0.5 % OP SOLN
OPHTHALMIC | Status: DC | PRN
  Administered 2016-10-18: 1 [drp] via OPHTHALMIC

## 2016-10-18 MED ORDER — LIDOCAINE HCL (PF) 4 % IJ SOLN
INTRAMUSCULAR | Status: DC | PRN
  Administered 2016-10-18: 5 mL via OPHTHALMIC

## 2016-10-18 MED ORDER — ALFENTANIL 500 MCG/ML IJ INJ
INJECTION | INTRAMUSCULAR | Status: DC | PRN
  Administered 2016-10-18: 300 ug via INTRAVENOUS
  Administered 2016-10-18: 100 ug via INTRAVENOUS
  Administered 2016-10-18: 200 ug via INTRAVENOUS
  Administered 2016-10-18: 100 ug via INTRAVENOUS

## 2016-10-18 MED ORDER — CYCLOPENTOLATE HCL 2 % OP SOLN
1.0000 [drp] | OPHTHALMIC | Status: AC
  Administered 2016-10-18 (×4): 1 [drp] via OPHTHALMIC

## 2016-10-18 MED ORDER — CARBACHOL 0.01 % IO SOLN
INTRAOCULAR | Status: DC | PRN
  Administered 2016-10-18: 0.5 mL via INTRAOCULAR

## 2016-10-18 MED ORDER — MIDAZOLAM HCL 2 MG/2ML IJ SOLN
INTRAMUSCULAR | Status: DC | PRN
  Administered 2016-10-18: 1 mg via INTRAVENOUS

## 2016-10-18 MED ORDER — SODIUM CHLORIDE 0.9 % IV SOLN
INTRAVENOUS | Status: DC
  Administered 2016-10-18: 09:00:00 via INTRAVENOUS

## 2016-10-18 MED ORDER — LIDOCAINE HCL (PF) 4 % IJ SOLN
INTRAOCULAR | Status: DC | PRN
  Administered 2016-10-18: 4 mL via OPHTHALMIC

## 2016-10-18 MED ORDER — PHENYLEPHRINE HCL 10 % OP SOLN
1.0000 [drp] | OPHTHALMIC | Status: AC
  Administered 2016-10-18 (×4): 1 [drp] via OPHTHALMIC

## 2016-10-18 MED ORDER — TETRACAINE HCL 0.5 % OP SOLN
OPHTHALMIC | Status: DC | PRN
  Administered 2016-10-18: 2 [drp] via OPHTHALMIC

## 2016-10-18 MED ORDER — NA CHONDROIT SULF-NA HYALURON 40-17 MG/ML IO SOLN
INTRAOCULAR | Status: DC | PRN
  Administered 2016-10-18: 1 mL via INTRAOCULAR

## 2016-10-18 MED ORDER — MOXIFLOXACIN HCL 0.5 % OP SOLN
1.0000 [drp] | OPHTHALMIC | Status: AC
  Administered 2016-10-18 (×3): 1 [drp] via OPHTHALMIC

## 2016-10-18 MED ORDER — MOXIFLOXACIN HCL 0.5 % OP SOLN
OPHTHALMIC | Status: AC
  Filled 2016-10-18: qty 3

## 2016-10-18 SURGICAL SUPPLY — 29 items
CANNULA ANT/CHMB 27GA (MISCELLANEOUS) ×2 IMPLANT
CORD BIP STRL DISP 12FT (MISCELLANEOUS) ×2 IMPLANT
CUP MEDICINE 2OZ PLAST GRAD ST (MISCELLANEOUS) ×2 IMPLANT
DRAPE XRAY CASSETTE 23X24 (DRAPES) ×2 IMPLANT
ERASER HMR WETFIELD 18G (MISCELLANEOUS) ×2 IMPLANT
GLOVE BIO SURGEON STRL SZ8 (GLOVE) ×2 IMPLANT
GLOVE SURG LX 6.5 MICRO (GLOVE) ×1
GLOVE SURG LX 8.0 MICRO (GLOVE) ×1
GLOVE SURG LX STRL 6.5 MICRO (GLOVE) ×1 IMPLANT
GLOVE SURG LX STRL 8.0 MICRO (GLOVE) ×1 IMPLANT
GOWN STRL REUS W/ TWL LRG LVL3 (GOWN DISPOSABLE) ×1 IMPLANT
GOWN STRL REUS W/ TWL XL LVL3 (GOWN DISPOSABLE) ×1 IMPLANT
GOWN STRL REUS W/TWL LRG LVL3 (GOWN DISPOSABLE) ×1
GOWN STRL REUS W/TWL XL LVL3 (GOWN DISPOSABLE) ×1
LENS IOL ACRYSOF IQ 19.5 (Intraocular Lens) ×2 IMPLANT
PACK CATARACT (MISCELLANEOUS) ×2 IMPLANT
PACK CATARACT DINGLEDEIN LX (MISCELLANEOUS) ×2 IMPLANT
PACK EYE AFTER SURG (MISCELLANEOUS) ×2 IMPLANT
SHLD EYE VISITEC  UNIV (MISCELLANEOUS) ×2 IMPLANT
SOL BSS BAG (MISCELLANEOUS) ×2
SOL PREP PVP 2OZ (MISCELLANEOUS) ×2
SOLUTION BSS BAG (MISCELLANEOUS) ×1 IMPLANT
SOLUTION PREP PVP 2OZ (MISCELLANEOUS) ×1 IMPLANT
SUT SILK 5-0 (SUTURE) ×2 IMPLANT
SYR 3ML LL SCALE MARK (SYRINGE) ×2 IMPLANT
SYR 5ML LL (SYRINGE) ×2 IMPLANT
SYR TB 1ML 27GX1/2 LL (SYRINGE) ×2 IMPLANT
WATER STERILE IRR 250ML POUR (IV SOLUTION) ×2 IMPLANT
WIPE NON LINTING 3.25X3.25 (MISCELLANEOUS) ×2 IMPLANT

## 2016-10-18 NOTE — Anesthesia Postprocedure Evaluation (Signed)
Anesthesia Post Note  Patient: Sabrina Montoya  Procedure(s) Performed: Procedure(s) (LRB): CATARACT EXTRACTION PHACO AND INTRAOCULAR LENS PLACEMENT (IOC) (Right)  Patient location during evaluation: PACU Anesthesia Type: MAC Level of consciousness: awake and alert and oriented Pain management: satisfactory to patient Vital Signs Assessment: post-procedure vital signs reviewed and stable Respiratory status: respiratory function stable Cardiovascular status: stable Anesthetic complications: no    Last Vitals:  Vitals:   10/18/16 0803 10/18/16 0956  BP: (!) 140/59 (!) 133/41  Pulse: (!) 58 (!) 58  Resp: 16 16  Temp: 36.8 C 36.6 C    Last Pain:  Vitals:   10/18/16 0803  TempSrc: Oral                 Blima Singer

## 2016-10-18 NOTE — Anesthesia Procedure Notes (Signed)
Procedure Name: MAC Performed by: Demetrius Charity Pre-anesthesia Checklist: Patient identified, Emergency Drugs available, Suction available, Timeout performed and Patient being monitored Oxygen Delivery Method: Nasal cannula

## 2016-10-18 NOTE — Transfer of Care (Signed)
Immediate Anesthesia Transfer of Care Note  Patient: Sabrina Montoya  Procedure(s) Performed: Procedure(s) with comments: CATARACT EXTRACTION PHACO AND INTRAOCULAR LENS PLACEMENT (IOC) (Right) - Lot # [id number] H [place]: 01:04.0 AP%: 24.4 CDE: 30.44  Patient Location: PACU  Anesthesia Type:MAC  Level of Consciousness: awake, alert  and oriented  Airway & Oxygen Therapy: Patient Spontanous Breathing  Post-op Assessment: Report given to RN and Post -op Vital signs reviewed and stable  Post vital signs: Reviewed and stable  Last Vitals:  Vitals:   10/18/16 0803 10/18/16 0956  BP: (!) 140/59 (!) 133/41  Pulse: (!) 58 (!) 58  Resp: 16 16  Temp: 36.8 C 36.6 C    Last Pain:  Vitals:   10/18/16 0803  TempSrc: Oral         Complications: No apparent anesthesia complications

## 2016-10-18 NOTE — Anesthesia Preprocedure Evaluation (Signed)
Anesthesia Evaluation  Patient identified by MRN, date of birth, ID band Patient awake    Reviewed: Allergy & Precautions, H&P , NPO status , Patient's Chart, lab work & pertinent test results, reviewed documented beta blocker date and time   Airway Mallampati: II  TM Distance: >3 FB Neck ROM: full    Dental no notable dental hx. (+) Teeth Intact   Pulmonary neg pulmonary ROS, former smoker,    Pulmonary exam normal breath sounds clear to auscultation       Cardiovascular Exercise Tolerance: Good hypertension, + CAD and + Peripheral Vascular Disease  negative cardio ROS  + dysrhythmias  Rhythm:regular Rate:Normal     Neuro/Psych PSYCHIATRIC DISORDERS TIA Neuromuscular disease negative neurological ROS  negative psych ROS   GI/Hepatic negative GI ROS, Neg liver ROS, hiatal hernia, GERD  Medicated,  Endo/Other  negative endocrine ROSdiabetes  Renal/GU Renal disease     Musculoskeletal   Abdominal   Peds  Hematology negative hematology ROS (+) anemia ,   Anesthesia Other Findings   Reproductive/Obstetrics negative OB ROS                             Anesthesia Physical Anesthesia Plan  ASA: III  Anesthesia Plan: MAC   Post-op Pain Management:    Induction:   Airway Management Planned:   Additional Equipment:   Intra-op Plan:   Post-operative Plan:   Informed Consent: I have reviewed the patients History and Physical, chart, labs and discussed the procedure including the risks, benefits and alternatives for the proposed anesthesia with the patient or authorized representative who has indicated his/her understanding and acceptance.     Plan Discussed with: CRNA  Anesthesia Plan Comments:         Anesthesia Quick Evaluation

## 2016-10-18 NOTE — Discharge Instructions (Signed)
Eye Surgery Discharge Instructions  Expect mild scratchy sensation or mild soreness. DO NOT RUB YOUR EYE!  The day of surgery:  Minimal physical activity, but bed rest is not required  No reading, computer work, or close hand work  No bending, lifting, or straining.  May watch TV  For 24 hours:  No driving, legal decisions, or alcoholic beverages  Safety precautions  Eat anything you prefer: It is better to start with liquids, then soup then solid foods.  _____ Eye patch should be worn until postoperative exam tomorrow.  ____ Solar shield eyeglasses should be worn for comfort in the sunlight/patch while sleeping  Resume all regular medications including aspirin or Coumadin if these were discontinued prior to surgery. You may shower, bathe, shave, or wash your hair. Tylenol may be taken for mild discomfort.  Call your doctor if you experience significant pain, nausea, or vomiting, fever > 101 or other signs of infection. [phone] or [phone] Specific instructions:  Follow-up Information    Daquavion Catala, MD Follow up.   Specialty:  Ophthalmology Why:  December 14 at 10:40am Contact information: [address]   [place] [address] [phone]

## 2016-10-18 NOTE — Interval H&P Note (Signed)
History and Physical Interval Note:  10/18/2016 9:10 AM  Sabrina Montoya  has presented today for surgery, with the diagnosis of CATARACT  The various methods of treatment have been discussed with the patient and family. After consideration of risks, benefits and other options for treatment, the patient has consented to  Procedure(s): CATARACT EXTRACTION PHACO AND INTRAOCULAR LENS PLACEMENT ([place]) (Right) as a surgical intervention .  The patient's history has been reviewed, patient examined, no change in status, stable for surgery.  I have reviewed the patient's chart and labs.  Questions were answered to the patient's satisfaction.     Cynethia Schindler

## 2016-10-18 NOTE — Op Note (Signed)
Date of Surgery: 10/18/2016 Date of Dictation: 10/18/2016 9:54 AM Pre-operative Diagnosis:  Nuclear Sclerotic Cataract right Eye Post-operative Diagnosis: same Procedure performed: Extra-capsular Cataract Extraction (ECCE) with placement of a posterior chamber intraocular lens (IOL) right Eye IOL:  Implant Name Type Inv. Item Serial No. Manufacturer Lot No. LRB No. Used  LENS IOL ACRYSOF IQ 19.5 - [id number] Intraocular Lens LENS IOL ACRYSOF IQ 19.5 [id number] ALCON   Right 1   Anesthesia: 2% Lidocaine and 4% Marcaine in a 50/50 mixture with 10 unites/ml of Hylenex given as a peribulbar Anesthesiologist: Anesthesiologist: Molli Barrows, MD CRNA: Demetrius Charity, CRNA Complications: none Estimated Blood Loss: less than 1 ml  Description of procedure:  The patient was given anesthesia and sedation via intravenous access. The patient was then prepped and draped in the usual fashion. A 25-gauge needle was bent for initiating the capsulorhexis. A 5-0 silk suture was placed through the conjunctiva superior and inferiorly to serve as bridle sutures. Hemostasis was obtained at the superior limbus using an eraser cautery. A partial thickness groove was made at the anterior surgical limbus with a 64 Beaver blade and this was dissected anteriorly with an Avaya. The anterior chamber was entered at 10 o'clock with a 1.0 mm paracentesis knife and through the lamellar dissection with a 2.6 mm Alcon keratome. Epi-Shugarcaine 0.5 CC [9 cc BSS Plus (Alcon), 3 cc 4% preservative-free lidocaine (Hospira) and 4 cc 1:1000 preservative-free, bisulfite-free epinephrine] was injected into the anterior chamber via the paracentesis tract. Epi-Shugarcaine 0.5 CC [9 cc BSS Plus (Alcon), 3 cc 4% preservative-free lidocaine (Hospira) and 4 cc 1:1000 preservative-free, bisulfite-free epinephrine] was injected into the anterior chamber via the paracentesis tract. DiscoVisc was injected to replace the aqueous and  a continuous tear curvilinear capsulorhexis was performed using a bent 25-gauge needle.  Balance salt on a syringe was used to perform hydro-dissection and phacoemulsification was carried out using a divide and conquer technique. Procedure(s) with comments: CATARACT EXTRACTION PHACO AND INTRAOCULAR LENS PLACEMENT (IOC) (Right) - Lot # [id number] H [place]: 01:04.0 AP%: 24.4 CDE: 30.44. Irrigation/aspiration was used to remove the residual cortex and the capsular bag was inflated with DiscoVisc. The intraocular lens was inserted into the capsular bag using a pre-loaded UltraSert Delivery System. Irrigation/aspiration was used to remove the residual DiscoVisc. The wound was inflated with balanced salt and checked for leaks. None were found. Miostat was injected via the paracentesis track and 0.1 ml of cefuroxime containing 1 mg of drug  was injected via the paracentesis track. The wound was checked for leaks again and none were found.   The bridal sutures were removed and two drops of Vigamox were placed on the eye. An eye shield was placed to protect the eye and the patient was discharged to the recovery area in good condition.   Reiana Poteet MD

## 2016-10-25 ENCOUNTER — Ambulatory Visit: Payer: Medicare Other

## 2016-10-25 DIAGNOSIS — R221 Localized swelling, mass and lump, neck: Secondary | ICD-10-CM

## 2016-10-25 DIAGNOSIS — I6523 Occlusion and stenosis of bilateral carotid arteries: Secondary | ICD-10-CM | POA: Diagnosis not present

## 2016-10-27 ENCOUNTER — Encounter: Payer: Self-pay | Admitting: Internal Medicine

## 2016-11-08 DIAGNOSIS — I10 Essential (primary) hypertension: Secondary | ICD-10-CM | POA: Diagnosis not present

## 2016-11-08 DIAGNOSIS — I714 Abdominal aortic aneurysm, without rupture: Secondary | ICD-10-CM | POA: Diagnosis not present

## 2016-11-08 DIAGNOSIS — I25119 Atherosclerotic heart disease of native coronary artery with unspecified angina pectoris: Secondary | ICD-10-CM | POA: Diagnosis not present

## 2016-11-08 DIAGNOSIS — I6523 Occlusion and stenosis of bilateral carotid arteries: Secondary | ICD-10-CM | POA: Diagnosis not present

## 2016-11-13 ENCOUNTER — Ambulatory Visit: Payer: Medicare Other

## 2016-11-14 ENCOUNTER — Encounter: Payer: Self-pay | Admitting: Dietician

## 2016-11-14 NOTE — Progress Notes (Signed)
Pt did not come to diabetes class on 11-13-16. Pt discharged

## 2016-11-16 DIAGNOSIS — M545 Low back pain: Secondary | ICD-10-CM | POA: Diagnosis not present

## 2016-11-16 DIAGNOSIS — M25552 Pain in left hip: Secondary | ICD-10-CM | POA: Diagnosis not present

## 2016-11-21 ENCOUNTER — Encounter: Payer: Self-pay | Admitting: Dietician

## 2016-11-24 ENCOUNTER — Telehealth: Payer: Self-pay | Admitting: Internal Medicine

## 2016-11-24 ENCOUNTER — Ambulatory Visit: Payer: Medicare Other | Admitting: Internal Medicine

## 2016-11-24 NOTE — Telephone Encounter (Signed)
The diabetes  Classes are optional, to help her  Understand diabetes .  If she does not feel they are necessary, she does not have to continue them at this time, she does not have to.  Lab Results  Component Value Date   HGBA1C 6.9 (H) 08/22/2016

## 2016-11-24 NOTE — Telephone Encounter (Signed)
FYI - Pt called and cancelled appt due to road not being plowed and unable to get out of driveway. Pt also wants to know if she still needs to take those diabetic classes or can she just come in to see Dr. Derrel Nip to follow up. Please advise, thank you!  Call pt @ [phone]

## 2016-11-24 NOTE — Telephone Encounter (Signed)
Pt.notified

## 2016-12-13 DIAGNOSIS — E1129 Type 2 diabetes mellitus with other diabetic kidney complication: Secondary | ICD-10-CM | POA: Diagnosis not present

## 2016-12-13 DIAGNOSIS — N390 Urinary tract infection, site not specified: Secondary | ICD-10-CM | POA: Diagnosis not present

## 2016-12-13 DIAGNOSIS — N183 Chronic kidney disease, stage 3 (moderate): Secondary | ICD-10-CM | POA: Diagnosis not present

## 2016-12-13 DIAGNOSIS — I129 Hypertensive chronic kidney disease with stage 1 through stage 4 chronic kidney disease, or unspecified chronic kidney disease: Secondary | ICD-10-CM | POA: Diagnosis not present

## 2016-12-21 DIAGNOSIS — N2581 Secondary hyperparathyroidism of renal origin: Secondary | ICD-10-CM | POA: Diagnosis not present

## 2016-12-21 DIAGNOSIS — N183 Chronic kidney disease, stage 3 (moderate): Secondary | ICD-10-CM | POA: Diagnosis not present

## 2016-12-21 DIAGNOSIS — N390 Urinary tract infection, site not specified: Secondary | ICD-10-CM | POA: Diagnosis not present

## 2016-12-21 DIAGNOSIS — I129 Hypertensive chronic kidney disease with stage 1 through stage 4 chronic kidney disease, or unspecified chronic kidney disease: Secondary | ICD-10-CM | POA: Diagnosis not present

## 2016-12-21 DIAGNOSIS — D631 Anemia in chronic kidney disease: Secondary | ICD-10-CM | POA: Diagnosis not present

## 2017-02-01 ENCOUNTER — Ambulatory Visit: Payer: Medicare Other

## 2017-02-01 ENCOUNTER — Ambulatory Visit: Admit: 2017-02-01 | Payer: Medicare Other | Admitting: Ophthalmology

## 2017-02-01 SURGERY — PHACOEMULSIFICATION, CATARACT, WITH IOL INSERTION
Anesthesia: Choice | Laterality: Left

## 2017-02-02 ENCOUNTER — Other Ambulatory Visit: Payer: Self-pay | Admitting: Internal Medicine

## 2017-02-08 ENCOUNTER — Ambulatory Visit (INDEPENDENT_AMBULATORY_CARE_PROVIDER_SITE_OTHER): Payer: Medicare Other

## 2017-02-08 VITALS — BP 136/80 | HR 58 | Temp 98.8°F | Resp 14 | Ht 65.0 in | Wt 158.1 lb

## 2017-02-08 DIAGNOSIS — Z0000 Encounter for general adult medical examination without abnormal findings: Secondary | ICD-10-CM

## 2017-02-08 NOTE — Patient Instructions (Addendum)
  Sabrina Montoya , Thank you for taking time to come for your Medicare Wellness Visit. I appreciate your ongoing commitment to your health goals. Please review the following plan we discussed and let me know if I can assist you in the future.   Follow up with Dr. Derrel Nip as needed.    Bring a copy of your [place] and/or Living Will to be scanned into chart.  Have a great day!  These are the goals we discussed: Goals    . Healthy Lifestyle          Stay hydrated! Drink plenty of fluids. Increase water intake. Low carb foods. Lean meats, fruits and vegetables.   Stay active and exercise at the [facility] center as often as possible.       This is a list of the screening recommended for you and due dates:  Health Maintenance  Topic Date Due  . Complete foot exam   05/27/1952  . Tetanus Vaccine  05/27/1961  . Hemoglobin A1C  02/20/2017  . Flu Shot  06/06/2017  . Urine Protein Check  08/22/2017  . Eye exam for diabetics  10/18/2017  . Mammogram  10/05/2018  . Colon Cancer Screening  09/01/2024  . DEXA scan (bone density measurement)  Completed  . Pneumonia vaccines  Completed

## 2017-02-08 NOTE — Progress Notes (Signed)
Subjective:   Sabrina Montoya is a 76 y.o. female who presents for Medicare Annual (Subsequent) preventive examination.  Review of Systems:  No ROS.  Medicare Wellness Visit. Cardiac Risk Factors include: advanced age (>46men, >24 women);diabetes mellitus     Objective:     Vitals: BP 136/80 (BP Location: Left Arm, Patient Position: Sitting, Cuff Size: Normal)   Pulse (!) 58   Temp 98.8 F (37.1 C) (Oral)   Resp 14   Ht 5\' 5"  (1.651 m)   Wt 158 lb 1.9 oz (71.7 kg)   SpO2 99%   BMI 26.31 kg/m   Body mass index is 26.31 kg/m.   Tobacco History  Smoking Status  . Former Smoker  . Packs/day: 0.50  . Years: 30.00  . Types: Cigarettes  . Quit date: 04/01/2012  Smokeless Tobacco  . Never Used    Comment: quit for 2 years after sinus infection and 6 months after heart surgery     Counseling given: Not Answered   Past Medical History:  Diagnosis Date  . Abdominal aortic aneurysm without mention of rupture    infrarenal, stable, folllowed by Leotis Pain  . Acute posthemorrhagic anemia   . Arthritis   . B12 deficiency   . CAD (coronary artery disease), autologous vein bypass graft   . Cardiac dysrhythmia, unspecified   . Colon polyp   . Depression   . Diverticulitis of colon   . Dizziness    DUE TO MEDICINES  . Dysrhythmia   . GERD (gastroesophageal reflux disease)   . HOH (hard of hearing)   . Hyperlipidemia   . Hypertension   . IBS (irritable bowel syndrome)   . Neuropathy ([place])   . Peripheral vascular disease ([place])    s/p CEA   . Reflux esophagitis   . Rheumatic fever    possible at age 10  . Sliding hiatal hernia   . Tobacco abuse   . Tobacco abuse    Past Surgical History:  Procedure Laterality Date  . ABDOMINAL SURGERY  1976   for pain secondary to scar tissue, s/p apply  . APPENDECTOMY  1974  . BACK SURGERY    . CAROTID ENDARTERECTOMY    . CATARACT EXTRACTION W/PHACO Right 10/18/2016   Procedure: CATARACT EXTRACTION PHACO AND INTRAOCULAR LENS  PLACEMENT (IOC);  Surgeon: Estill Cotta, MD;  Location: [facility] ORS;  Service: Ophthalmology;  Laterality: Right;  Lot # [id number] H [place]: 01:04.0 AP%: 24.4 CDE: 30.44  . CEA     Carotid stenosis found during workup for dysphagia,  [facility]  . 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 CHOLECYSTECTOMY  2002   Dr. Pat Patrick  . CLOSED MANIPULATION SHOULDER     left shoulder post physical therapy, Right shoulder redo (toggle bolts)  . COLONOSCOPY  09/01/14  . coronary angiography  01/2011   one occluded artery with collateralization, other arteries patent  . CORONARY ARTERY BYPASS GRAFT  2006   s/p triple bypass surgery, [facility]  . heart catherization  2016  . HERNIA REPAIR  may 2011   Dr. Pat Patrick  . hernia repair    . LUMBAR DISC SURGERY  1971   L5, unssuccessful, fusion in 1983 successful (2 lumbar)  . ROTATOR CUFF REPAIR  2002   right shoulder Dr. Nadean Corwin, [facility] in [place]  . VASCULAR SURGERY     Family History  Problem Relation Age of Onset  . Cancer Sister  cervical cancer  . Diabetes Sister   . Heart disease Brother     coronary artery disease  . Other Brother     suicide  . Diabetes Mother   . Heart disease Mother   . Diabetes Sister   . Other Father     suicide  . Cancer Sister     breast  . Other Other     colon resection due to inflammation -nephew   History  Sexual Activity  . Sexual activity: Not Currently    Outpatient Encounter Prescriptions as of 02/08/2017  Medication Sig  . aspirin 81 MG tablet Take 81 mg by mouth daily.  Marland Kitchen buPROPion (WELLBUTRIN XL) 150 MG 24 hr tablet TAKE 1 TABLET BY MOUTH ONCE EVERY MORNING  . cholecalciferol (VITAMIN D) 1000 UNITS tablet Take 1,000 Units by mouth daily.  . clonazePAM (KLONOPIN) 0.5 MG tablet Take 1 tablet (0.5 mg total) by mouth at bedtime.  . dicyclomine (BENTYL) 20 MG tablet Take 1 tablet (20 mg total) by mouth 2 (two) times daily.  Marland Kitchen glucose blood  test strip Use as instructed to test blood sugars with One Touch Glucometer. Dx: E11.9  . isosorbide mononitrate (IMDUR) 30 MG 24 hr tablet Take 30 mg by mouth daily.  . metoprolol tartrate (LOPRESSOR) 25 MG tablet TAKE 1/2 TABLET BY MOUTH TWICE A DAY  . nitroGLYCERIN (NITROSTAT) 0.4 MG SL tablet Place 1 tablet (0.4 mg total) under the tongue every 5 (five) minutes as needed. As needed for chest pain  . omeprazole (PRILOSEC) 20 MG capsule TAKE 1 CAPSULE BY MOUTH ONCE DAILY.  . ONE TOUCH LANCETS MISC Use as directed to test blood sugars with One Touch Glucometer.  Dx: E11.9  . rosuvastatin (CRESTOR) 40 MG tablet TAKE 1 TABLET BY MOUTH ONCE EVERY EVENING   No facility-administered encounter medications on file as of 02/08/2017.     Activities of Daily Living In your present state of health, do you have any difficulty performing the following activities: 02/08/2017  Hearing? N  Vision? N  Difficulty concentrating or making decisions? Y  Walking or climbing stairs? Y  Dressing or bathing? N  Doing errands, shopping? N  Preparing Food and eating ? N  Using the Toilet? N  In the past six months, have you accidently leaked urine? N  Do you have problems with loss of bowel control? N  Managing your Medications? N  Managing your Finances? N  Housekeeping or managing your Housekeeping? N  Some recent data might be hidden    Patient Care Team: Crecencio Mc, MD as PCP - General (Internal Medicine) Crecencio Mc, MD (Internal Medicine) Robert Bellow, MD (General Surgery)    Assessment:    This is a routine wellness examination for Sabrina Montoya. The goal of the wellness visit is to assist the patient how to close the gaps in care and create a preventative care plan for the patient.   Taking calcium VIT D as appropriate/Osteoporosis risk reviewed.  Medications reviewed; taking without issues or barriers.  Safety issues reviewed; smoke detectors in the home. No firearms in the home.   Wears  seatbelts when driving or riding with others. Patient does wear sunscreen or protective clothing when in direct sunlight. No violence in the home.  Patient is alert, normal appearance, oriented to person/place/and time. Correctly identified the president of the [place], recall of 2/3 words, and performing simple calculations.  Patient displays appropriate judgement and can read correct time from watch face.  No new identified risk were noted.  No failures at ADL's or IADL's.   BMI- discussed the importance of a healthy diet, water intake and exercise. Educational material provided.   Dental- every six months.  Eye- Visual acuity not assessed per patient preference since they have regular follow up with the ophthalmologist.  Wears corrective lenses.  Sleep patterns- Sleeps 5-6 hours at night.   TDAP vaccine deferred per patient preference to follow up with insurance.  Educational material provided.   Patient Concerns: None at this time. Follow up with PCP as needed.  Exercise Activities and Dietary recommendations Current Exercise Habits: The patient does not participate in regular exercise at present  Goals    . Healthy Lifestyle          Stay hydrated! Drink plenty of fluids. Increase water intake. Low carb foods. Lean meats, fruits and vegetables.   Stay active and exercise at the [facility] center as often as possible.      Fall Risk Fall Risk  02/08/2017 06/06/2016 02/02/2016 04/30/2014  Falls in the past year? No Yes Yes No  Number falls in past yr: - 1 1 -  Injury with Fall? - Yes Yes -  Risk for fall due to : - History of fall(s) - -  Follow up - - Education provided;Falls prevention discussed -   Depression Screen PHQ 2/9 Scores 02/08/2017 06/06/2016 02/02/2016 04/30/2014  PHQ - 2 Score 0 1 0 1     Cognitive Function MMSE - Mini Mental State Exam 02/02/2016  Orientation to time 5  Orientation to Place 5  Registration 3  Attention/ Calculation 5  Recall 3  Language- name 2  objects 2  Language- repeat 1  Language- follow 3 step command 3  Language- read & follow direction 1  Write a sentence 1  Copy design 1  Total score 30     6CIT Screen 02/08/2017  What Year? 0 points  What month? 0 points  What time? 0 points  Count back from 20 0 points  Months in reverse 0 points  Repeat phrase 0 points  Total Score 0    Immunization History  Administered Date(s) Administered  . Influenza Split 10/10/2011, 08/26/2012  . Influenza, High Dose Seasonal PF 08/16/2016  . Influenza,inj,Quad PF,36+ Mos 07/25/2013, 07/28/2014, 08/13/2015  . Influenza-Unspecified 08/06/2012  . Pneumococcal Conjugate-13 08/26/2014  . Pneumococcal Polysaccharide-23 09/02/2010   Screening Tests Health Maintenance  Topic Date Due  . FOOT EXAM  05/27/1952  . TETANUS/TDAP  05/27/1961  . HEMOGLOBIN A1C  02/20/2017  . INFLUENZA VACCINE  06/06/2017  . URINE MICROALBUMIN  08/22/2017  . OPHTHALMOLOGY EXAM  10/18/2017  . MAMMOGRAM  10/05/2018  . COLONOSCOPY  09/01/2024  . DEXA SCAN  Completed  . PNA vac Low Risk Adult  Completed      Plan:   End of life planning; Advanced aging; Advanced directives discussed.  No HCPOA/Living Will.  Additional information provided to help them start the conversation with family.  Copy of HCPOA/Living Will requested upon completion. Time spent on this topic is 30 minutes.  Medicare Attestation I have personally reviewed: The patient's medical and social history Their use of alcohol, tobacco or illicit drugs Their current medications and supplements The patient's functional ability including ADLs,fall risks, home safety risks, cognitive, and hearing and visual impairment Diet and physical activities Evidence for depression   The patient's weight, height, BMI, and visual acuity have been recorded in the chart.  I have made referrals and provided  education to the patient based on review of the above and I have provided the patient with a written  personalized care plan for preventive services.    During the course of the visit the patient was educated and counseled about the following appropriate screening and preventive services:   Vaccines to include Pneumoccal, Influenza, Hepatitis B, Td, Zostavax, HCV  Colorectal cancer screening-UTD  Bone density screening-UTD  Diabetes-followed by PCP  Glaucoma-annual eye exam  Mammography-UTD  Nutrition counseling   Patient Instructions (the written plan) was given to the patient.   Varney Biles, LPN  5/0/5183

## 2017-02-08 NOTE — Progress Notes (Signed)
  I have reviewed the above information and agree with above.   Jennfer Gassen, MD 

## 2017-02-09 ENCOUNTER — Encounter: Payer: Self-pay | Admitting: Internal Medicine

## 2017-02-09 ENCOUNTER — Ambulatory Visit (INDEPENDENT_AMBULATORY_CARE_PROVIDER_SITE_OTHER): Payer: Medicare Other | Admitting: Internal Medicine

## 2017-02-09 VITALS — BP 118/66 | HR 69 | Temp 98.4°F | Resp 16 | Ht 65.0 in | Wt 159.8 lb

## 2017-02-09 DIAGNOSIS — E7800 Pure hypercholesterolemia, unspecified: Secondary | ICD-10-CM | POA: Diagnosis not present

## 2017-02-09 DIAGNOSIS — I714 Abdominal aortic aneurysm, without rupture, unspecified: Secondary | ICD-10-CM

## 2017-02-09 DIAGNOSIS — N183 Chronic kidney disease, stage 3 unspecified: Secondary | ICD-10-CM

## 2017-02-09 DIAGNOSIS — E1122 Type 2 diabetes mellitus with diabetic chronic kidney disease: Secondary | ICD-10-CM

## 2017-02-09 DIAGNOSIS — F329 Major depressive disorder, single episode, unspecified: Secondary | ICD-10-CM

## 2017-02-09 DIAGNOSIS — R300 Dysuria: Secondary | ICD-10-CM

## 2017-02-09 DIAGNOSIS — F450 Somatization disorder: Secondary | ICD-10-CM

## 2017-02-09 DIAGNOSIS — I739 Peripheral vascular disease, unspecified: Secondary | ICD-10-CM | POA: Diagnosis not present

## 2017-02-09 DIAGNOSIS — N182 Chronic kidney disease, stage 2 (mild): Secondary | ICD-10-CM | POA: Diagnosis not present

## 2017-02-09 DIAGNOSIS — N952 Postmenopausal atrophic vaginitis: Secondary | ICD-10-CM

## 2017-02-09 DIAGNOSIS — I6523 Occlusion and stenosis of bilateral carotid arteries: Secondary | ICD-10-CM | POA: Diagnosis not present

## 2017-02-09 LAB — CBC WITH DIFFERENTIAL/PLATELET
BASOS ABS: 0 10*3/uL (ref 0.0–0.1)
Basophils Relative: 0.4 % (ref 0.0–3.0)
EOS PCT: 3.1 % (ref 0.0–5.0)
Eosinophils Absolute: 0.1 10*3/uL (ref 0.0–0.7)
HCT: 31.1 % — ABNORMAL LOW (ref 36.0–46.0)
Hemoglobin: 10.4 g/dL — ABNORMAL LOW (ref 12.0–15.0)
LYMPHS ABS: 1.5 10*3/uL (ref 0.7–4.0)
Lymphocytes Relative: 34.6 % (ref 12.0–46.0)
MCHC: 33.6 g/dL (ref 30.0–36.0)
MCV: 86.4 fl (ref 78.0–100.0)
MONO ABS: 0.4 10*3/uL (ref 0.1–1.0)
MONOS PCT: 9.3 % (ref 3.0–12.0)
NEUTROS ABS: 2.3 10*3/uL (ref 1.4–7.7)
NEUTROS PCT: 52.6 % (ref 43.0–77.0)
PLATELETS: 197 10*3/uL (ref 150.0–400.0)
RBC: 3.59 Mil/uL — AB (ref 3.87–5.11)
RDW: 15.3 % (ref 11.5–15.5)
WBC: 4.4 10*3/uL (ref 4.0–10.5)

## 2017-02-09 LAB — POCT URINALYSIS DIPSTICK
Bilirubin, UA: NEGATIVE
Blood, UA: NEGATIVE
GLUCOSE UA: NEGATIVE
KETONES UA: NEGATIVE
SPEC GRAV UA: 1.02 (ref 1.030–1.035)
Urobilinogen, UA: 0.2 (ref ?–2.0)
pH, UA: 5 (ref 5.0–8.0)

## 2017-02-09 LAB — COMPREHENSIVE METABOLIC PANEL
ALBUMIN: 4.5 g/dL (ref 3.5–5.2)
ALT: 17 U/L (ref 0–35)
AST: 31 U/L (ref 0–37)
Alkaline Phosphatase: 65 U/L (ref 39–117)
BILIRUBIN TOTAL: 0.4 mg/dL (ref 0.2–1.2)
BUN: 22 mg/dL (ref 6–23)
CALCIUM: 9.4 mg/dL (ref 8.4–10.5)
CO2: 31 mEq/L (ref 19–32)
CREATININE: 1.06 mg/dL (ref 0.40–1.20)
Chloride: 107 mEq/L (ref 96–112)
GFR: 53.76 mL/min — ABNORMAL LOW (ref >60.00)
Glucose, Bld: 124 mg/dL — ABNORMAL HIGH (ref 70–99)
Potassium: 4.2 mEq/L (ref 3.5–5.1)
SODIUM: 143 meq/L (ref 135–145)
TOTAL PROTEIN: 6.9 g/dL (ref 6.0–8.3)

## 2017-02-09 LAB — LIPID PANEL
CHOLESTEROL: 116 mg/dL (ref 0–200)
HDL: 42.2 mg/dL (ref >39.00)
LDL CALC: 53 mg/dL (ref 0–99)
NonHDL: 74.12
Total CHOL/HDL Ratio: 3
Triglycerides: 104 mg/dL (ref 0.0–149.0)
VLDL: 20.8 mg/dL (ref 0.0–40.0)

## 2017-02-09 LAB — URINALYSIS, MICROSCOPIC ONLY: RBC / HPF: NONE SEEN (ref 0–?)

## 2017-02-09 LAB — LDL CHOLESTEROL, DIRECT: LDL DIRECT: 53 mg/dL

## 2017-02-09 LAB — HEMOGLOBIN A1C: HEMOGLOBIN A1C: 6.7 % — AB (ref 4.6–6.5)

## 2017-02-09 LAB — POCT GLYCOSYLATED HEMOGLOBIN (HGB A1C): HEMOGLOBIN A1C: 6.2

## 2017-02-09 NOTE — Progress Notes (Signed)
Subjective:  Patient ID: Sabrina Montoya, female    DOB: 01-Jul-1941  Age: 76 y.o. MRN: [id number]  CC: The primary encounter diagnosis was CKD (chronic kidney disease) stage 3, GFR 30-59 ml/min. Diagnoses of Dysuria, Diabetes mellitus with stage 2 chronic kidney disease ([place]), Pure hypercholesterolemia, Asymptomatic bilateral carotid artery stenosis, Depression with somatization, Abdominal aortic aneurysm (AAA) without rupture ([place]), Atrophic vaginitis, and Peripheral vascular disease ([place]) were also pertinent to this visit.  HPI Sabrina Montoya presents for 3 month follow up on diabetes.  Patient has no complaints today.  Patient is following a low glycemic index diet  But often indulges in cravings.    Patient has had an eye exam in the last 12 months and checks feet regularly for signs of infection.  Patient does not walk barefoot outside,  And denies an numbness tingling or burning in feet. Patient is up to date on all recommended vaccinations  Has been having discomfort with urination described today as warm,  not burning,  No suprapubic pain or increased frequency ,  Present for the past  Month   Foot exam done,  Eye exam done  Cataract right eye was removed done last month by [facility] . Left eye to be done next month by [facility]   Outpatient Medications Prior to Visit  Medication Sig Dispense Refill  . aspirin 81 MG tablet Take 81 mg by mouth daily.    Marland Kitchen buPROPion (WELLBUTRIN XL) 150 MG 24 hr tablet TAKE 1 TABLET BY MOUTH ONCE EVERY MORNING 30 tablet 3  . cholecalciferol (VITAMIN D) 1000 UNITS tablet Take 1,000 Units by mouth daily.    . clonazePAM (KLONOPIN) 0.5 MG tablet Take 1 tablet (0.5 mg total) by mouth at bedtime. 30 tablet 5  . dicyclomine (BENTYL) 20 MG tablet Take 1 tablet (20 mg total) by mouth 2 (two) times daily. 60 tablet 11  . glucose blood test strip Use as instructed to test blood sugars with One Touch Glucometer. Dx: E11.9 200 each 5  . isosorbide mononitrate (IMDUR) 30  MG 24 hr tablet Take 30 mg by mouth daily.    . metoprolol tartrate (LOPRESSOR) 25 MG tablet TAKE 1/2 TABLET BY MOUTH TWICE A DAY 30 tablet 3  . nitroGLYCERIN (NITROSTAT) 0.4 MG SL tablet Place 1 tablet (0.4 mg total) under the tongue every 5 (five) minutes as needed. As needed for chest pain 10 tablet 1  . omeprazole (PRILOSEC) 20 MG capsule TAKE 1 CAPSULE BY MOUTH ONCE DAILY. 30 capsule 4  . ONE TOUCH LANCETS MISC Use as directed to test blood sugars with One Touch Glucometer.  Dx: E11.9 200 each 5  . rosuvastatin (CRESTOR) 40 MG tablet TAKE 1 TABLET BY MOUTH ONCE EVERY EVENING 30 tablet 3   No facility-administered medications prior to visit.     Review of Systems;  Patient denies headache, fevers, malaise, unintentional weight loss, skin rash, eye pain, sinus congestion and sinus pain, sore throat, dysphagia,  hemoptysis , cough, dyspnea, wheezing, chest pain, palpitations, orthopnea, edema, abdominal pain, nausea, melena, diarrhea, constipation, flank pain, dysuria, hematuria, urinary  Frequency, nocturia, numbness, tingling, seizures,  Focal weakness, Loss of consciousness,  Tremor, insomnia, depression, anxiety, and suicidal ideation.      Objective:  BP 118/66   Pulse 69   Temp 98.4 F (36.9 C) (Oral)   Resp 16   Ht 5\' 5"  (1.651 m)   Wt 159 lb 12.8 oz (72.5 kg)   SpO2 98%   BMI 26.59  kg/m   BP Readings from Last 3 Encounters:  02/09/17 118/66  02/08/17 136/80  10/18/16 (!) 142/61    Wt Readings from Last 3 Encounters:  02/09/17 159 lb 12.8 oz (72.5 kg)  02/08/17 158 lb 1.9 oz (71.7 kg)  10/18/16 159 lb (72.1 kg)    General appearance: alert, cooperative and appears stated age Ears: normal TM's and external ear canals both ears Throat: lips, mucosa, and tongue normal; teeth and gums normal Neck: no adenopathy, no carotid bruit, supple, symmetrical, trachea midline and thyroid not enlarged, symmetric, no tenderness/mass/nodules Back: symmetric, no curvature. ROM  normal. No CVA tenderness. Lungs: clear to auscultation bilaterally Heart: regular rate and rhythm, S1, S2 normal, no murmur, click, rub or gallop Abdomen: soft, non-tender; bowel sounds normal; no masses,  no organomegaly Pulses: 2+ and symmetric Skin: Skin color, texture, turgor normal. No rashes or lesions Lymph nodes: Cervical, supraclavicular, and axillary nodes normal.  Lab Results  Component Value Date   HGBA1C 6.7 (H) 02/09/2017   HGBA1C 6.2 02/09/2017   HGBA1C 6.9 (H) 08/22/2016    Lab Results  Component Value Date   CREATININE 1.06 02/09/2017   CREATININE 1.44 (H) 08/22/2016   CREATININE 1.25 (H) 05/16/2016    Lab Results  Component Value Date   WBC 4.4 02/09/2017   HGB 10.4 (L) 02/09/2017   HCT 31.1 (L) 02/09/2017   PLT 197.0 02/09/2017   GLUCOSE 124 (H) 02/09/2017   CHOL 116 02/09/2017   TRIG 104.0 02/09/2017   HDL 42.20 02/09/2017   LDLDIRECT 53.0 02/09/2017   LDLCALC 53 02/09/2017   ALT 17 02/09/2017   AST 31 02/09/2017   NA 143 02/09/2017   K 4.2 02/09/2017   CL 107 02/09/2017   CREATININE 1.06 02/09/2017   BUN 22 02/09/2017   CO2 31 02/09/2017   TSH 1.79 03/07/2016   HGBA1C 6.7 (H) 02/09/2017   MICROALBUR <0.7 08/22/2016    No results found.  Assessment & Plan:   Problem List Items Addressed This Visit    Aneurysm of abdominal aorta ([place])    Annual surveillance shows no change in size. Continue statin, aggressive blood pressure  management and tobacco cessation       Asymptomatic carotid artery stenosis    With prior left CEA.  2007 at [facility].  last doppler May 2017  40-50% on Left 50-69% on right.       Atrophic vaginitis    Discussed as the cause for her current urinary symptoms given normal UA and culture,  Vaginal estrogen offered      CKD (chronic kidney disease) stage 3, GFR 30-59 ml/min - Primary   Relevant Orders   Comprehensive metabolic panel (Completed)   Depression with somatization      Symptoms have improved since  starting wellbutrin       Diabetes mellitus with stage 2 chronic kidney disease (Sabrina Montoya)    Remains  well-controlled on diet alone but A1c is rising Patient is up-to-date on eye exams and foot exam has been done today. Patient has no microalbuminuria. Patient is tolerating statin therapy for CAD risk reduction and on ACE/ARB for renal protection and hypertension .    Lab Results  Component Value Date   HGBA1C 6.7 (H) 02/09/2017   Lab Results  Component Value Date   MICROALBUR <0.7 08/22/2016   Lab Results  Component Value Date   CREATININE 1.06 02/09/2017         Relevant Orders   POCT HgB A1C (Completed)  Comprehensive metabolic panel (Completed)   Hyperlipidemia   Relevant Orders   Lipid panel (Completed)   Peripheral vascular disease ([place])    Managed with statin,  Tobacco cessation, aspirin and annual surveillance by vascular surgery with carotid and abdominal aortic ultrasound.  LDL is at goal.   . Lab Results  Component Value Date   CHOL 116 02/09/2017   HDL 42.20 02/09/2017   LDLCALC 53 02/09/2017   LDLDIRECT 53.0 02/09/2017   TRIG 104.0 02/09/2017   CHOLHDL 3 02/09/2017          Other Visit Diagnoses    Dysuria       Relevant Orders   POCT Urinalysis Dipstick (Completed)   Urine Microscopic Only (Completed)   Urine Culture (Completed)      I am having Ms. Wegner maintain her cholecalciferol, aspirin, nitroGLYCERIN, isosorbide mononitrate, dicyclomine, omeprazole, glucose blood, ONE TOUCH LANCETS, clonazePAM, metoprolol tartrate, rosuvastatin, and buPROPion.  No orders of the defined types were placed in this encounter.   There are no discontinued medications.  Follow-up: Return in about 6 months (around 08/11/2017) for follow up diabetes.   Crecencio Mc, MD

## 2017-02-09 NOTE — Progress Notes (Signed)
Pre visit review using our clinic review tool, if applicable. No additional management support is needed unless otherwise documented below in the visit note. 

## 2017-02-09 NOTE — Patient Instructions (Addendum)
Low carb cookies you can find at [employer]:  PowerCruch Quest Atkins Lemon bars (new)   Breyer's carb smart fudgsicles and ice cream bars (lowe's and HT)    You might want to try a premixed protein drink called Premier Protein shake for breakfast or late night snack . It is great tasting,   very low sugar and available of < $2 serving at [facility] and  In bulk for $1.50/serving at [employer] and [employer]  .    Nutritional analysis :  160 cal  30 g protein  1 g sugar 50% calcium needs   Vladimir Faster and [employer]   Try making cherry ice cream/shake from   Frozen cherries Almond milk Atkins vanilla shake Fat free vanilla yogurt

## 2017-02-11 ENCOUNTER — Encounter: Payer: Self-pay | Admitting: Internal Medicine

## 2017-02-11 DIAGNOSIS — N952 Postmenopausal atrophic vaginitis: Secondary | ICD-10-CM | POA: Insufficient documentation

## 2017-02-11 LAB — URINE CULTURE: ORGANISM ID, BACTERIA: NO GROWTH

## 2017-02-11 NOTE — Assessment & Plan Note (Signed)
With prior left CEA.  2007 at [facility].  last doppler May 2017  40-50% on Left 50-69% on right.

## 2017-02-11 NOTE — Assessment & Plan Note (Signed)
Discussed as the cause for her current urinary symptoms given normal UA and culture,  Vaginal estrogen offered

## 2017-02-11 NOTE — Assessment & Plan Note (Signed)
Managed with statin,  Tobacco cessation, aspirin and annual surveillance by vascular surgery with carotid and abdominal aortic ultrasound.  LDL is at goal.   . Lab Results  Component Value Date   CHOL 116 02/09/2017   HDL 42.20 02/09/2017   LDLCALC 53 02/09/2017   LDLDIRECT 53.0 02/09/2017   TRIG 104.0 02/09/2017   CHOLHDL 3 02/09/2017

## 2017-02-11 NOTE — Assessment & Plan Note (Signed)
Symptoms have improved since starting wellbutrin

## 2017-02-11 NOTE — Assessment & Plan Note (Signed)
Remains  well-controlled on diet alone but A1c is rising Patient is up-to-date on eye exams and foot exam has been done today. Patient has no microalbuminuria. Patient is tolerating statin therapy for CAD risk reduction and on ACE/ARB for renal protection and hypertension .    Lab Results  Component Value Date   HGBA1C 6.7 (H) 02/09/2017   Lab Results  Component Value Date   MICROALBUR <0.7 08/22/2016   Lab Results  Component Value Date   CREATININE 1.06 02/09/2017

## 2017-02-11 NOTE — Assessment & Plan Note (Addendum)
Annual surveillance shows no change in size. Continue statin, aggressive blood pressure  management and tobacco cessation

## 2017-02-26 NOTE — Telephone Encounter (Signed)
Mailed unread message to patient.  

## 2017-03-01 DIAGNOSIS — H2512 Age-related nuclear cataract, left eye: Secondary | ICD-10-CM | POA: Diagnosis not present

## 2017-03-03 ENCOUNTER — Other Ambulatory Visit: Payer: Self-pay | Admitting: Internal Medicine

## 2017-03-08 ENCOUNTER — Encounter: Payer: Self-pay | Admitting: *Deleted

## 2017-03-08 NOTE — Discharge Instructions (Signed)
General Anesthesia, Adult, Care After °These instructions provide you with information about caring for yourself after your procedure. Your health care provider may also give you more specific instructions. Your treatment has been planned according to current medical practices, but problems sometimes occur. Call your health care provider if you have any problems or questions after your procedure. °What can I expect after the procedure? °After the procedure, it is common to have: °· Vomiting. °· A sore throat. °· Mental slowness. °It is common to feel: °· Nauseous. °· Cold or shivery. °· Sleepy. °· Tired. °· Sore or achy, even in parts of your body where you did not have surgery. °Follow these instructions at home: °For at least 24 hours after the procedure: °· Do not: °¨ Participate in activities where you could fall or become injured. °¨ Drive. °¨ Use heavy machinery. °¨ Drink alcohol. °¨ Take sleeping pills or medicines that cause drowsiness. °¨ Make important decisions or sign legal documents. °¨ Take care of children on your own. °· Rest. °Eating and drinking °· If you vomit, drink water, juice, or soup when you can drink without vomiting. °· Drink enough fluid to keep your urine clear or pale yellow. °· Make sure you have little or no nausea before eating solid foods. °· Follow the diet recommended by your health care provider. °General instructions °· Have a responsible adult stay with you until you are awake and alert. °· Return to your normal activities as told by your health care provider. Ask your health care provider what activities are safe for you. °· Take over-the-counter and prescription medicines only as told by your health care provider. °· If you smoke, do not smoke without supervision. °· Keep all follow-up visits as told by your health care provider. This is important. °Contact a health care provider if: °· You continue to have nausea or vomiting at home, and medicines are not helpful. °· You  cannot drink fluids or start eating again. °· You cannot urinate after 8-12 hours. °· You develop a skin rash. °· You have fever. °· You have increasing redness at the site of your procedure. °Get help right away if: °· You have difficulty breathing. °· You have chest pain. °· You have unexpected bleeding. °· You feel that you are having a life-threatening or urgent problem. °This information is not intended to replace advice given to you by your health care provider. Make sure you discuss any questions you have with your health care provider. °Document Released: 01/29/2001 Document Revised: 03/27/2016 Document Reviewed: 10/07/2015 °Elsevier Interactive Patient Education © 2017 [employer] ° ° °Cataract Surgery, Care After °Refer to this sheet in the next few weeks. These instructions provide you with information about caring for yourself after your procedure. Your health care provider may also give you more specific instructions. Your treatment has been planned according to current medical practices, but problems sometimes occur. Call your health care provider if you have any problems or questions after your procedure. °What can I expect after the procedure? °After the procedure, it is common to have: °· Itching. °· Discomfort. °· Fluid discharge. °· Sensitivity to light and to touch. °· Bruising. °Follow these instructions at home: °Eye Care °· Check your eye every day for signs of infection. Watch for: °¨ Redness, swelling, or pain. °¨ Fluid, blood, or pus. °¨ Warmth. °¨ Bad smell. °Activity °· Avoid strenuous activities, such as playing contact sports, for as long as told by your health care provider. °· Do not   drive or operate heavy machinery until your health care provider approves. °· Do not bend or lift heavy objects . Bending increases pressure in the eye. You can walk, climb stairs, and do light household chores. °· Ask your health care provider when you can return to work. If you work in a dusty  environment, you may be advised to wear protective eyewear for a period of time. °General instructions °· Take or apply over-the-counter and prescription medicines only as told by your health care provider. This includes eye drops. °· Do not touch or rub your eyes. °· If you were given a protective shield, wear it as told by your health care provider. If you were not given a protective shield, wear sunglasses as told by your health care provider to protect your eyes. °· Keep the area around your eye clean and dry. Avoid swimming or allowing water to hit you directly in the face while showering until told by your health care provider. Keep soap and shampoo out of your eyes. °· Do not put a contact lens into the affected eye or eyes until your health care provider approves. °· Keep all follow-up visits as told by your health care provider. This is important. °Contact a health care provider if: ° °· You have increased bruising around your eye. °· You have pain that is not helped with medicine. °· You have a fever. °· You have redness, swelling, or pain in your eye. °· You have fluid, blood, or pus coming from your incision. °· Your vision gets worse. °Get help right away if: °· You have sudden vision loss. °This information is not intended to replace advice given to you by your health care provider. Make sure you discuss any questions you have with your health care provider. °Document Released: 05/12/2005 Document Revised: 03/02/2016 Document Reviewed: 09/02/2015 °Elsevier Interactive Patient Education © 2017 [employer]vier Inc[employer] ° °

## 2017-03-09 ENCOUNTER — Encounter (INDEPENDENT_AMBULATORY_CARE_PROVIDER_SITE_OTHER): Payer: Self-pay | Admitting: Vascular Surgery

## 2017-03-09 ENCOUNTER — Ambulatory Visit (INDEPENDENT_AMBULATORY_CARE_PROVIDER_SITE_OTHER): Payer: Medicare Other

## 2017-03-09 ENCOUNTER — Ambulatory Visit (INDEPENDENT_AMBULATORY_CARE_PROVIDER_SITE_OTHER): Payer: Medicare Other | Admitting: Vascular Surgery

## 2017-03-09 VITALS — BP 130/66 | HR 61 | Resp 16 | Wt 157.8 lb

## 2017-03-09 DIAGNOSIS — I714 Abdominal aortic aneurysm, without rupture, unspecified: Secondary | ICD-10-CM

## 2017-03-09 DIAGNOSIS — N182 Chronic kidney disease, stage 2 (mild): Secondary | ICD-10-CM | POA: Diagnosis not present

## 2017-03-09 DIAGNOSIS — I6523 Occlusion and stenosis of bilateral carotid arteries: Secondary | ICD-10-CM | POA: Diagnosis not present

## 2017-03-09 DIAGNOSIS — E1122 Type 2 diabetes mellitus with diabetic chronic kidney disease: Secondary | ICD-10-CM

## 2017-03-09 DIAGNOSIS — E7800 Pure hypercholesterolemia, unspecified: Secondary | ICD-10-CM

## 2017-03-13 DIAGNOSIS — I129 Hypertensive chronic kidney disease with stage 1 through stage 4 chronic kidney disease, or unspecified chronic kidney disease: Secondary | ICD-10-CM | POA: Diagnosis not present

## 2017-03-13 DIAGNOSIS — N183 Chronic kidney disease, stage 3 (moderate): Secondary | ICD-10-CM | POA: Diagnosis not present

## 2017-03-13 DIAGNOSIS — D631 Anemia in chronic kidney disease: Secondary | ICD-10-CM | POA: Diagnosis not present

## 2017-03-13 DIAGNOSIS — N2581 Secondary hyperparathyroidism of renal origin: Secondary | ICD-10-CM | POA: Diagnosis not present

## 2017-03-14 ENCOUNTER — Ambulatory Visit: Payer: Medicare Other | Admitting: Anesthesiology

## 2017-03-14 ENCOUNTER — Ambulatory Visit
Admission: RE | Admit: 2017-03-14 | Discharge: 2017-03-14 | Disposition: A | Discharge status: Home / Self Care | Payer: Medicare Other | Source: Ambulatory Visit | Attending: Ophthalmology | Admitting: Ophthalmology

## 2017-03-14 ENCOUNTER — Encounter
Admission: RE | Disposition: A | Discharge status: Home / Self Care | Payer: Self-pay | Source: Ambulatory Visit | Attending: Ophthalmology

## 2017-03-14 DIAGNOSIS — I2510 Atherosclerotic heart disease of native coronary artery without angina pectoris: Secondary | ICD-10-CM | POA: Insufficient documentation

## 2017-03-14 DIAGNOSIS — D649 Anemia, unspecified: Secondary | ICD-10-CM | POA: Insufficient documentation

## 2017-03-14 DIAGNOSIS — K219 Gastro-esophageal reflux disease without esophagitis: Secondary | ICD-10-CM | POA: Insufficient documentation

## 2017-03-14 DIAGNOSIS — I739 Peripheral vascular disease, unspecified: Secondary | ICD-10-CM | POA: Insufficient documentation

## 2017-03-14 DIAGNOSIS — Z87891 Personal history of nicotine dependence: Secondary | ICD-10-CM | POA: Insufficient documentation

## 2017-03-14 DIAGNOSIS — I10 Essential (primary) hypertension: Secondary | ICD-10-CM | POA: Insufficient documentation

## 2017-03-14 DIAGNOSIS — K449 Diaphragmatic hernia without obstruction or gangrene: Secondary | ICD-10-CM | POA: Insufficient documentation

## 2017-03-14 DIAGNOSIS — H2512 Age-related nuclear cataract, left eye: Secondary | ICD-10-CM | POA: Diagnosis not present

## 2017-03-14 DIAGNOSIS — F329 Major depressive disorder, single episode, unspecified: Secondary | ICD-10-CM | POA: Diagnosis not present

## 2017-03-14 DIAGNOSIS — E1136 Type 2 diabetes mellitus with diabetic cataract: Secondary | ICD-10-CM | POA: Insufficient documentation

## 2017-03-14 DIAGNOSIS — Z8673 Personal history of transient ischemic attack (TIA), and cerebral infarction without residual deficits: Secondary | ICD-10-CM | POA: Insufficient documentation

## 2017-03-14 HISTORY — PX: CATARACT EXTRACTION W/PHACO: SHX586

## 2017-03-14 SURGERY — PHACOEMULSIFICATION, CATARACT, WITH IOL INSERTION
Anesthesia: Monitor Anesthesia Care | Site: Eye | Laterality: Left | Wound class: Clean

## 2017-03-14 MED ORDER — LIDOCAINE HCL (PF) 2 % IJ SOLN
INTRAMUSCULAR | Status: DC | PRN
  Administered 2017-03-14: 1 mL via INTRAOCULAR

## 2017-03-14 MED ORDER — MIDAZOLAM HCL 2 MG/2ML IJ SOLN
INTRAMUSCULAR | Status: DC | PRN
  Administered 2017-03-14: 2 mg via INTRAVENOUS

## 2017-03-14 MED ORDER — BRIMONIDINE TARTRATE-TIMOLOL 0.2-0.5 % OP SOLN
OPHTHALMIC | Status: DC | PRN
  Administered 2017-03-14: 1 [drp] via OPHTHALMIC

## 2017-03-14 MED ORDER — FENTANYL CITRATE (PF) 100 MCG/2ML IJ SOLN
INTRAMUSCULAR | Status: DC | PRN
  Administered 2017-03-14 (×2): 50 ug via INTRAVENOUS

## 2017-03-14 MED ORDER — CEFUROXIME OPHTHALMIC INJECTION 1 MG/0.1 ML
INJECTION | OPHTHALMIC | Status: DC | PRN
  Administered 2017-03-14: 0.1 mL via OPHTHALMIC

## 2017-03-14 MED ORDER — LACTATED RINGERS IV SOLN: INTRAVENOUS | Status: DC

## 2017-03-14 MED ORDER — EPINEPHRINE PF 1 MG/ML IJ SOLN
INTRAOCULAR | Status: DC | PRN
  Administered 2017-03-14: 65 mL via OPHTHALMIC

## 2017-03-14 MED ORDER — ARMC OPHTHALMIC DILATING DROPS
1.0000 "application " | OPHTHALMIC | Status: DC | PRN
  Administered 2017-03-14 (×3): 1 via OPHTHALMIC

## 2017-03-14 MED ORDER — NA HYALUR & NA CHOND-NA HYALUR 0.4-0.35 ML IO KIT
PACK | INTRAOCULAR | Status: DC | PRN
  Administered 2017-03-14: 1 mL via INTRAOCULAR

## 2017-03-14 MED ORDER — MOXIFLOXACIN HCL 0.5 % OP SOLN
1.0000 [drp] | OPHTHALMIC | Status: DC | PRN
  Administered 2017-03-14 (×3): 1 [drp] via OPHTHALMIC

## 2017-03-14 SURGICAL SUPPLY — 25 items
CANNULA ANT/CHMB 27GA (MISCELLANEOUS) ×2 IMPLANT
CARTRIDGE ABBOTT (MISCELLANEOUS) IMPLANT
GLOVE SURG LX 7.5 STRW (GLOVE) ×1
GLOVE SURG LX STRL 7.5 STRW (GLOVE) ×1 IMPLANT
GLOVE SURG TRIUMPH 8.0 PF LTX (GLOVE) ×2 IMPLANT
GOWN STRL REUS W/ TWL LRG LVL3 (GOWN DISPOSABLE) ×2 IMPLANT
GOWN STRL REUS W/TWL LRG LVL3 (GOWN DISPOSABLE) ×2
LENS IOL ACRYSOF IQ 19.5 (Intraocular Lens) ×2 IMPLANT
MARKER SKIN DUAL TIP RULER LAB (MISCELLANEOUS) ×2 IMPLANT
NDL RETROBULBAR .5 NSTRL (NEEDLE) IMPLANT
NEEDLE FILTER BLUNT 18X 1/2SAF (NEEDLE) ×2
NEEDLE FILTER BLUNT 18X1 1/2 (NEEDLE) ×2 IMPLANT
PACK CATARACT BRASINGTON (MISCELLANEOUS) ×2 IMPLANT
PACK EYE AFTER SURG (MISCELLANEOUS) ×2 IMPLANT
PACK OPTHALMIC (MISCELLANEOUS) ×2 IMPLANT
RING MALYGIN 7.0 (MISCELLANEOUS) IMPLANT
SUT ETHILON 10-0 CS-B-6CS-B-6 (SUTURE)
SUT VICRYL  9 0 (SUTURE)
SUT VICRYL 9 0 (SUTURE) IMPLANT
SUTURE EHLN 10-0 CS-B-6CS-B-6 (SUTURE) IMPLANT
SYR 3ML LL SCALE MARK (SYRINGE) ×4 IMPLANT
SYR 5ML LL (SYRINGE) ×2 IMPLANT
SYR TB 1ML LUER SLIP (SYRINGE) ×2 IMPLANT
WATER STERILE IRR 250ML POUR (IV SOLUTION) ×2 IMPLANT
WIPE NON LINTING 3.25X3.25 (MISCELLANEOUS) ×2 IMPLANT

## 2017-03-14 NOTE — Transfer of Care (Signed)
Immediate Anesthesia Transfer of Care Note  Patient: Sabrina Montoya  Procedure(s) Performed: Procedure(s): CATARACT EXTRACTION PHACO AND INTRAOCULAR LENS PLACEMENT (IOC)  Left (Left)  Patient Location: PACU  Anesthesia Type: MAC  Level of Consciousness: awake, alert  and patient cooperative  Airway and Oxygen Therapy: Patient Spontanous Breathing and Patient connected to supplemental oxygen  Post-op Assessment: Post-op Vital signs reviewed, Patient's Cardiovascular Status Stable, Respiratory Function Stable, Patent Airway and No signs of Nausea or vomiting  Post-op Vital Signs: Reviewed and stable  Complications: No apparent anesthesia complications

## 2017-03-14 NOTE — Anesthesia Postprocedure Evaluation (Signed)
Anesthesia Post Note  Patient: Sabrina Montoya  Procedure(s) Performed: Procedure(s) (LRB): CATARACT EXTRACTION PHACO AND INTRAOCULAR LENS PLACEMENT (IOC)  Left (Left)  Patient location during evaluation: PACU Anesthesia Type: MAC Level of consciousness: awake and alert Pain management: pain level controlled Vital Signs Assessment: post-procedure vital signs reviewed and stable Respiratory status: spontaneous breathing, nonlabored ventilation, respiratory function stable and patient connected to nasal cannula oxygen Cardiovascular status: stable and blood pressure returned to baseline Anesthetic complications: no    Trecia Rogers

## 2017-03-14 NOTE — Anesthesia Preprocedure Evaluation (Signed)
Anesthesia Evaluation  Patient identified by MRN, date of birth, ID band Patient awake    Reviewed: Allergy & Precautions, H&P , NPO status , Patient's Chart, lab work & pertinent test results, reviewed documented beta blocker date and time   Airway Mallampati: I  TM Distance: >3 FB Neck ROM: full    Dental no notable dental hx.    Pulmonary former smoker,    Pulmonary exam normal breath sounds clear to auscultation       Cardiovascular hypertension, + CAD, + CABG and + Peripheral Vascular Disease  Normal cardiovascular exam Rhythm:regular Rate:Normal  AAA, followed. Carotid disease, s/p CEA   Neuro/Psych PSYCHIATRIC DISORDERS DepressionTIA   GI/Hepatic Neg liver ROS, hiatal hernia, GERD  ,  Endo/Other  diabetes  Renal/GU CRFRenal diseasenegative Renal ROS  negative genitourinary   Musculoskeletal   Abdominal   Peds  Hematology  (+) anemia ,   Anesthesia Other Findings   Reproductive/Obstetrics negative OB ROS                             Anesthesia Physical Anesthesia Plan  ASA: III  Anesthesia Plan: MAC   Post-op Pain Management:    Induction:   Airway Management Planned:   Additional Equipment:   Intra-op Plan:   Post-operative Plan:   Informed Consent: I have reviewed the patients History and Physical, chart, labs and discussed the procedure including the risks, benefits and alternatives for the proposed anesthesia with the patient or authorized representative who has indicated his/her understanding and acceptance.   Dental Advisory Given  Plan Discussed with: CRNA and Anesthesiologist  Anesthesia Plan Comments:         Anesthesia Quick Evaluation

## 2017-03-14 NOTE — Op Note (Signed)
OPERATIVE NOTE  Sabrina Montoya [id number] 03/14/2017   PREOPERATIVE DIAGNOSIS:  Nuclear sclerotic cataract left eye. H25.12   POSTOPERATIVE DIAGNOSIS:    Nuclear sclerotic cataract left eye.     PROCEDURE:  Phacoemusification with posterior chamber intraocular lens placement of the left eye   LENS:   Implant Name Type Inv. Item Serial No. Manufacturer Lot No. LRB No. Used  LENS IOL ACRYSOF IQ 19.5 - [id number] Intraocular Lens LENS IOL ACRYSOF IQ 19.5 [id number] ALCON   Left 1        ULTRASOUND TIME: 17  % of 0 minutes 55 seconds, CDE 9.7  SURGEON:  Wyonia Hough, MD   ANESTHESIA:  Topical with tetracaine drops and 2% Xylocaine jelly, augmented with 1% preservative-free intracameral lidocaine.    COMPLICATIONS:  None.   DESCRIPTION OF PROCEDURE:  The patient was identified in the holding room and transported to the operating room and placed in the supine position under the operating microscope.  The left eye was identified as the operative eye and it was prepped and draped in the usual sterile ophthalmic fashion.   A 1 millimeter clear-corneal paracentesis was made at the 1:30 position.  0.5 ml of preservative-free 1% lidocaine was injected into the anterior chamber.  The anterior chamber was filled with Viscoat viscoelastic.  A 2.4 millimeter keratome was used to make a near-clear corneal incision at the 10:30 position.  .  A curvilinear capsulorrhexis was made with a cystotome and capsulorrhexis forceps.  Balanced salt solution was used to hydrodissect and hydrodelineate the nucleus.   Phacoemulsification was then used in stop and chop fashion to remove the lens nucleus and epinucleus.  The remaining cortex was then removed using the irrigation and aspiration handpiece. Provisc was then placed into the capsular bag to distend it for lens placement.  A lens was then injected into the capsular bag.  The remaining viscoelastic was aspirated.   Wounds were hydrated with balanced  salt solution.  The anterior chamber was inflated to a physiologic pressure with balanced salt solution.  No wound leaks were noted. Cefuroxime 0.1 ml of a 10mg /ml solution was injected into the anterior chamber for a dose of 1 mg of intracameral antibiotic at the completion of the case.   Timolol and Brimonidine drops were applied to the eye.  The patient was taken to the recovery room in stable condition without complications of anesthesia or surgery.  [name] 03/14/2017, 11:23 AM

## 2017-03-14 NOTE — H&P (Signed)
The History and Physical notes are on paper, have been signed, and are to be scanned. The patient remains stable and unchanged from the H&P.   Previous H&P reviewed, patient examined, and there are no changes.  Sabrina Montoya 03/14/2017 9:59 AM

## 2017-03-14 NOTE — Anesthesia Procedure Notes (Signed)
Procedure Name: MAC Performed by: Mayme Genta Pre-anesthesia Checklist: Patient identified, Emergency Drugs available, Suction available, Timeout performed and Patient being monitored Patient Re-evaluated:Patient Re-evaluated prior to inductionOxygen Delivery Method: Nasal cannula Placement Confirmation: positive ETCO2

## 2017-03-15 ENCOUNTER — Encounter: Payer: Self-pay | Admitting: Ophthalmology

## 2017-03-16 ENCOUNTER — Encounter: Payer: Self-pay | Admitting: Internal Medicine

## 2017-03-22 DIAGNOSIS — L821 Other seborrheic keratosis: Secondary | ICD-10-CM | POA: Diagnosis not present

## 2017-03-22 DIAGNOSIS — D485 Neoplasm of uncertain behavior of skin: Secondary | ICD-10-CM | POA: Diagnosis not present

## 2017-03-22 DIAGNOSIS — D0462 Carcinoma in situ of skin of left upper limb, including shoulder: Secondary | ICD-10-CM | POA: Diagnosis not present

## 2017-03-22 DIAGNOSIS — L218 Other seborrheic dermatitis: Secondary | ICD-10-CM | POA: Diagnosis not present

## 2017-03-22 DIAGNOSIS — D1801 Hemangioma of skin and subcutaneous tissue: Secondary | ICD-10-CM | POA: Diagnosis not present

## 2017-03-22 DIAGNOSIS — Z85828 Personal history of other malignant neoplasm of skin: Secondary | ICD-10-CM | POA: Diagnosis not present

## 2017-03-23 DIAGNOSIS — N183 Chronic kidney disease, stage 3 (moderate): Secondary | ICD-10-CM | POA: Diagnosis not present

## 2017-03-23 DIAGNOSIS — N2581 Secondary hyperparathyroidism of renal origin: Secondary | ICD-10-CM | POA: Diagnosis not present

## 2017-03-23 DIAGNOSIS — D631 Anemia in chronic kidney disease: Secondary | ICD-10-CM | POA: Diagnosis not present

## 2017-03-23 DIAGNOSIS — I129 Hypertensive chronic kidney disease with stage 1 through stage 4 chronic kidney disease, or unspecified chronic kidney disease: Secondary | ICD-10-CM | POA: Diagnosis not present

## 2017-04-10 DIAGNOSIS — N183 Chronic kidney disease, stage 3 (moderate): Secondary | ICD-10-CM | POA: Diagnosis not present

## 2017-04-10 DIAGNOSIS — I129 Hypertensive chronic kidney disease with stage 1 through stage 4 chronic kidney disease, or unspecified chronic kidney disease: Secondary | ICD-10-CM | POA: Diagnosis not present

## 2017-04-11 ENCOUNTER — Encounter (INDEPENDENT_AMBULATORY_CARE_PROVIDER_SITE_OTHER): Payer: Self-pay | Admitting: Vascular Surgery

## 2017-04-20 DIAGNOSIS — L905 Scar conditions and fibrosis of skin: Secondary | ICD-10-CM | POA: Diagnosis not present

## 2017-04-20 DIAGNOSIS — D0462 Carcinoma in situ of skin of left upper limb, including shoulder: Secondary | ICD-10-CM | POA: Diagnosis not present

## 2017-04-25 NOTE — Progress Notes (Signed)
Subjective:    Patient ID: Sabrina Montoya, female    DOB: 1941/07/23, 76 y.o.   MRN: [id number] Chief Complaint  Patient presents with  . Follow-up   Patient presents today for 6 month AAA follow-up. The patient presents today without complaint. She denies any abdominal, back pain, chest pain or shortness of breath. The patient underwent an abdominal aorta duplex exam which was notable for an abdominal aortic aneurysm measuring 3.89 cm x 3.91 cm there is greater than 50% atherosclerotic disease noted in the distal abdominal aorta. We'll compared to the previous exam on 09/08/2016 there has been an increase in the maximal transverse diameter with elevated velocities noted in the distal abdominal aorta and bifurcation. The patient underwent a bilateral lower extremity ABI which was notable for no hemodynamically significant atherosclerotic plaque, great toe pressure and PPG waveforms are within normal limits bilaterally this is stable when compared to the 09/08/2016 previous test. The patient denies any fever, nausea or vomiting.    Review of Systems  Constitutional: Negative.   HENT: Negative.   Eyes: Negative.   Respiratory: Negative.   Cardiovascular: Negative.   Gastrointestinal: Negative.   Endocrine: Negative.   Genitourinary: Negative.   Musculoskeletal: Negative.   Skin: Negative.   Allergic/Immunologic: Negative.   Neurological: Negative.   Hematological: Negative.   Psychiatric/Behavioral: Negative.       Objective:   Physical Exam  Constitutional: She is oriented to person, place, and time. She appears well-developed and well-nourished. No distress.  HENT:  Head: Normocephalic and atraumatic.  Eyes: Conjunctivae are normal. Pupils are equal, round, and reactive to light.  Neck: Normal range of motion.  Cardiovascular: Normal rate, regular rhythm, normal heart sounds and intact distal pulses.   Pulses:      Radial pulses are 2+ on the right side, and 2+ on the left side.      Dorsalis pedis pulses are 2+ on the right side, and 2+ on the left side.       Posterior tibial pulses are 2+ on the right side, and 2+ on the left side.  Pulmonary/Chest: Effort normal.  Musculoskeletal: Normal range of motion. She exhibits no edema.  Neurological: She is alert and oriented to person, place, and time.  Skin: Skin is warm and dry. She is not diaphoretic.  Psychiatric: She has a normal mood and affect. Her behavior is normal. Judgment and thought content normal.  Vitals reviewed.   BP 130/66   Pulse 61   Resp 16   Wt 157 lb 12.8 oz (71.6 kg)   BMI 26.26 kg/m   Past Medical History:  Diagnosis Date  . Abdominal aortic aneurysm without mention of rupture    infrarenal, stable, folllowed by Leotis Pain  . Acute posthemorrhagic anemia   . Arthritis   . B12 deficiency   . CAD (coronary artery disease), autologous vein bypass graft   . Cardiac dysrhythmia, unspecified   . Colon polyp   . Depression   . Diverticulitis of colon   . Dizziness    DUE TO MEDICINES  . Dysrhythmia   . GERD (gastroesophageal reflux disease)   . HOH (hard of hearing)   . Hyperlipidemia   . Hypertension    pt denies. placed on meds after CABG  . IBS (irritable bowel syndrome)   . Neuropathy   . Peripheral vascular disease ([place])    s/p CEA   . Reflux esophagitis   . Rheumatic fever    possible at age 2  .  Sliding hiatal hernia   . Tobacco abuse   . Tobacco abuse     Social History   Social History  . Marital status: Widowed    Spouse name: N/A  . Number of children: N/A  . Years of education: N/A   Occupational History  . Not on file.   Social History Main Topics  . Smoking status: Former Smoker    Packs/day: 0.50    Years: 30.00    Types: Cigarettes    Quit date: 04/01/2012  . Smokeless tobacco: Never Used     Comment: quit for 2 years after sinus infection and 6 months after heart surgery  . Alcohol use No  . Drug use: No  . Sexual activity: Not Currently    Other Topics Concern  . Not on file   Social History Narrative   Lives with spouse [name]    Past Surgical History:  Procedure Laterality Date  . ABDOMINAL SURGERY  1976   for pain secondary to scar tissue, s/p apply  . APPENDECTOMY  1974  . BACK SURGERY    . CAROTID ENDARTERECTOMY    . CATARACT EXTRACTION W/PHACO Right 10/18/2016   Procedure: CATARACT EXTRACTION PHACO AND INTRAOCULAR LENS PLACEMENT (IOC);  Surgeon: Estill Cotta, MD;  Location: [facility] ORS;  Service: Ophthalmology;  Laterality: Right;  Lot # [id number] H [place]: 01:04.0 AP%: 24.4 CDE: 30.44  . CATARACT EXTRACTION W/PHACO Left 03/14/2017   Procedure: CATARACT EXTRACTION PHACO AND INTRAOCULAR LENS PLACEMENT ([place])  Left;  Surgeon: Leandrew Koyanagi, MD;  Location: [place];  Service: Ophthalmology;  Laterality: Left;  . CEA     Carotid stenosis found during workup for dysphagia,  [facility]  . 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 CHOLECYSTECTOMY  2002   Dr. Pat Patrick  . CLOSED MANIPULATION SHOULDER     left shoulder post physical therapy, Right shoulder redo (toggle bolts)  . COLONOSCOPY  09/01/14  . coronary angiography  01/2011   one occluded artery with collateralization, other arteries patent  . CORONARY ARTERY BYPASS GRAFT  2006   s/p triple bypass surgery, [facility]  . heart catherization  2016  . HERNIA REPAIR  may 2011   Dr. Pat Patrick  . hernia repair    . LUMBAR DISC SURGERY  1971   L5, unssuccessful, fusion in 1983 successful (2 lumbar)  . ROTATOR CUFF REPAIR  2002   right shoulder Dr. Nadean Corwin, [facility] in [place]  . VASCULAR SURGERY      Family History  Problem Relation Age of Onset  . Cancer Sister        cervical cancer  . Diabetes Sister   . Heart disease Brother        coronary artery disease  . Other Brother        suicide  . Diabetes Mother   . Heart disease Mother   . Diabetes Sister   . Other Father         suicide  . Cancer Sister        breast  . Other Other        colon resection due to inflammation -nephew    Allergies  Allergen Reactions  . Niacin And Related Hives  . Sertraline Hcl Other (See Comments)    Hallucinations,    . Sulfa Drugs Cross Reactors Nausea Only  . Tetracyclines & Related Hives  . Latex Rash    RAST testing was NEGATIVE  for LATEX  . Simvastatin Rash    *Antihyperlipidemics*; elevated LFT's.        Assessment & Plan:  Patient presents today for 6 month AAA follow-up. The patient presents today without complaint. She denies any abdominal, back pain, chest pain or shortness of breath. The patient underwent an abdominal aorta duplex exam which was notable for an abdominal aortic aneurysm measuring 3.89 cm x 3.91 cm there is greater than 50% atherosclerotic disease noted in the distal abdominal aorta. We'll compared to the previous exam on 09/08/2016 there has been an increase in the maximal transverse diameter with elevated velocities noted in the distal abdominal aorta and bifurcation. The patient underwent a bilateral lower extremity ABI which was notable for no hemodynamically significant atherosclerotic plaque, great toe pressure and PPG waveforms are within normal limits bilaterally this is stable when compared to the 09/08/2016 previous test. The patient denies any fever, nausea or vomiting.   1. AAA (abdominal aortic aneurysm) without rupture (HCC) - Stable With minimal increase in size on today's duplex We discussed the importance of controlling her hypertension and its effect on increasing aneurysmal size. Patient to follow-up in 6 months for repeat duplex I have discussed with the patient at length the risk factors for and pathogenesis of atherosclerotic disease and encouraged a healthy diet, regular exercise regimen and blood pressure / glucose control. Patient was instructed to contact our office with problems in the interim such back pain, pulsatile  abdominal masses or thrombosis in her extremities, extremity pain or development of ulcerations.    - VAS [place] AAA DUPLEX; Future - VAS [place] ABI WITH/WO TBI; Future  2. Diabetes mellitus with stage 2 chronic kidney disease (HCC) - stable Encouraged good control as its slows the progression of atherosclerotic disease  3. Pure hypercholesterolemia - stable AAA Encouraged good control as its slows the progression of atherosclerotic disease  Current Outpatient Prescriptions on File Prior to Visit  Medication Sig Dispense Refill  . aspirin 81 MG tablet Take 81 mg by mouth daily.    Marland Kitchen buPROPion (WELLBUTRIN XL) 150 MG 24 hr tablet TAKE 1 TABLET BY MOUTH ONCE EVERY MORNING 30 tablet 3  . cholecalciferol (VITAMIN D) 1000 UNITS tablet Take 1,000 Units by mouth daily.    . clonazePAM (KLONOPIN) 0.5 MG tablet Take 1 tablet (0.5 mg total) by mouth at bedtime. 30 tablet 5  . dicyclomine (BENTYL) 20 MG tablet TAKE 1 TABLET BY MOUTH TWICE DAILY. 60 tablet 3  . glucose blood test strip Use as instructed to test blood sugars with One Touch Glucometer. Dx: E11.9 200 each 5  . isosorbide mononitrate (IMDUR) 30 MG 24 hr tablet Take 30 mg by mouth daily.    . metoprolol tartrate (LOPRESSOR) 25 MG tablet TAKE 1/2 TABLET BY MOUTH TWICE A DAY 30 tablet 3  . Multiple Vitamins-Minerals (PRESERVISION AREDS 2 PO) Take by mouth 2 (two) times daily.    . nitroGLYCERIN (NITROSTAT) 0.4 MG SL tablet Place 1 tablet (0.4 mg total) under the tongue every 5 (five) minutes as needed. As needed for chest pain 10 tablet 1  . omeprazole (PRILOSEC) 20 MG capsule TAKE 1 CAPSULE BY MOUTH ONCE DAILY. 30 capsule 4  . omeprazole (PRILOSEC) 20 MG capsule TAKE 1 CAPSULE BY MOUTH ONCE DAILY 30 capsule 3  . ONE TOUCH LANCETS MISC Use as directed to test blood sugars with One Touch Glucometer.  Dx: E11.9 200 each 5  . rosuvastatin (CRESTOR) 40 MG tablet TAKE 1 TABLET BY MOUTH  ONCE EVERY EVENING 30 tablet 3   No current facility-administered  medications on file prior to visit.     There are no Patient Instructions on file for this visit. No Follow-up on file.   Jesilyn Easom A Amani Marseille, PA-C

## 2017-05-05 ENCOUNTER — Other Ambulatory Visit: Payer: Self-pay | Admitting: Internal Medicine

## 2017-05-31 DIAGNOSIS — B372 Candidiasis of skin and nail: Secondary | ICD-10-CM | POA: Diagnosis not present

## 2017-05-31 DIAGNOSIS — Z9889 Other specified postprocedural states: Secondary | ICD-10-CM | POA: Diagnosis not present

## 2017-07-02 ENCOUNTER — Other Ambulatory Visit: Payer: Self-pay

## 2017-07-02 MED ORDER — OMEPRAZOLE 20 MG PO CPDR: 20.0000 mg | DELAYED_RELEASE_CAPSULE | Freq: Every day | ORAL | 2 refills | Status: DC

## 2017-07-03 DIAGNOSIS — I129 Hypertensive chronic kidney disease with stage 1 through stage 4 chronic kidney disease, or unspecified chronic kidney disease: Secondary | ICD-10-CM | POA: Diagnosis not present

## 2017-07-03 DIAGNOSIS — N390 Urinary tract infection, site not specified: Secondary | ICD-10-CM | POA: Diagnosis not present

## 2017-07-03 DIAGNOSIS — N183 Chronic kidney disease, stage 3 (moderate): Secondary | ICD-10-CM | POA: Diagnosis not present

## 2017-07-03 DIAGNOSIS — D631 Anemia in chronic kidney disease: Secondary | ICD-10-CM | POA: Diagnosis not present

## 2017-07-05 ENCOUNTER — Other Ambulatory Visit: Payer: Self-pay | Admitting: Internal Medicine

## 2017-07-16 DIAGNOSIS — G8929 Other chronic pain: Secondary | ICD-10-CM | POA: Diagnosis not present

## 2017-07-16 DIAGNOSIS — M5136 Other intervertebral disc degeneration, lumbar region: Secondary | ICD-10-CM | POA: Diagnosis not present

## 2017-07-16 DIAGNOSIS — M545 Low back pain: Secondary | ICD-10-CM | POA: Diagnosis not present

## 2017-07-16 DIAGNOSIS — Z9889 Other specified postprocedural states: Secondary | ICD-10-CM | POA: Diagnosis not present

## 2017-07-16 DIAGNOSIS — M5440 Lumbago with sciatica, unspecified side: Secondary | ICD-10-CM | POA: Diagnosis not present

## 2017-07-26 DIAGNOSIS — H353131 Nonexudative age-related macular degeneration, bilateral, early dry stage: Secondary | ICD-10-CM | POA: Diagnosis not present

## 2017-07-26 DIAGNOSIS — H43392 Other vitreous opacities, left eye: Secondary | ICD-10-CM | POA: Diagnosis not present

## 2017-08-01 NOTE — Telephone Encounter (Signed)
Error

## 2017-08-02 ENCOUNTER — Other Ambulatory Visit: Payer: Self-pay | Admitting: Family

## 2017-08-02 ENCOUNTER — Other Ambulatory Visit (HOSPITAL_COMMUNITY)
Admission: RE | Admit: 2017-08-02 | Discharge: 2017-08-02 | Disposition: A | Discharge status: Home / Self Care | Payer: Medicare Other | Source: Ambulatory Visit | Attending: Family | Admitting: Family

## 2017-08-02 ENCOUNTER — Encounter: Payer: Self-pay | Admitting: Family

## 2017-08-02 ENCOUNTER — Ambulatory Visit (INDEPENDENT_AMBULATORY_CARE_PROVIDER_SITE_OTHER): Payer: Medicare Other | Admitting: Family

## 2017-08-02 ENCOUNTER — Ambulatory Visit
Admission: RE | Admit: 2017-08-02 | Discharge: 2017-08-02 | Disposition: A | Discharge status: Home / Self Care | Payer: Medicare Other | Source: Ambulatory Visit | Attending: Family | Admitting: Family

## 2017-08-02 VITALS — BP 136/68 | HR 72 | Temp 98.7°F | Ht 65.0 in | Wt 157.6 lb

## 2017-08-02 DIAGNOSIS — I6523 Occlusion and stenosis of bilateral carotid arteries: Secondary | ICD-10-CM | POA: Diagnosis not present

## 2017-08-02 DIAGNOSIS — R1030 Lower abdominal pain, unspecified: Secondary | ICD-10-CM | POA: Diagnosis not present

## 2017-08-02 DIAGNOSIS — R109 Unspecified abdominal pain: Secondary | ICD-10-CM | POA: Insufficient documentation

## 2017-08-02 DIAGNOSIS — R932 Abnormal findings on diagnostic imaging of liver and biliary tract: Secondary | ICD-10-CM | POA: Diagnosis not present

## 2017-08-02 DIAGNOSIS — K5792 Diverticulitis of intestine, part unspecified, without perforation or abscess without bleeding: Secondary | ICD-10-CM

## 2017-08-02 DIAGNOSIS — K5712 Diverticulitis of small intestine without perforation or abscess without bleeding: Secondary | ICD-10-CM | POA: Insufficient documentation

## 2017-08-02 DIAGNOSIS — I714 Abdominal aortic aneurysm, without rupture: Secondary | ICD-10-CM | POA: Insufficient documentation

## 2017-08-02 DIAGNOSIS — K5732 Diverticulitis of large intestine without perforation or abscess without bleeding: Secondary | ICD-10-CM | POA: Diagnosis not present

## 2017-08-02 LAB — URINALYSIS, ROUTINE W REFLEX MICROSCOPIC
BILIRUBIN URINE: NEGATIVE
Hgb urine dipstick: NEGATIVE
Ketones, ur: NEGATIVE
Leukocytes, UA: NEGATIVE
Nitrite: NEGATIVE
PH: 5.5 (ref 5.0–8.0)
RBC / HPF: NONE SEEN (ref 0–?)
Specific Gravity, Urine: >=1.03 — AB (ref 1.000–1.030)
TOTAL PROTEIN, URINE-UPE24: NEGATIVE
UROBILINOGEN UA: 0.2 (ref 0.0–1.0)
Urine Glucose: NEGATIVE
WBC, UA: NONE SEEN (ref 0–?)

## 2017-08-02 LAB — POCT I-STAT CREATININE: Creatinine, Ser: 1.1 mg/dL — ABNORMAL HIGH (ref 0.44–1.00)

## 2017-08-02 MED ORDER — IOPAMIDOL (ISOVUE-300) INJECTION 61%
100.0000 mL | Freq: Once | INTRAVENOUS | Status: AC | PRN
  Administered 2017-08-02: 100 mL via INTRAVENOUS

## 2017-08-02 MED ORDER — METRONIDAZOLE 500 MG PO TABS: 500.0000 mg | ORAL_TABLET | Freq: Three times a day (TID) | ORAL | 0 refills | Status: AC

## 2017-08-02 MED ORDER — CIPROFLOXACIN HCL 500 MG PO TABS: 500.0000 mg | ORAL_TABLET | Freq: Two times a day (BID) | ORAL | 0 refills | Status: AC

## 2017-08-02 NOTE — Patient Instructions (Addendum)
Urine studies today  Stat Ct abdomen  Please follow up with Dr Para March regarding suspicion for bladder prolapse  Playing a role in symptoms.let us know if we can facilitate this in any way.   If there is no improvement in your symptoms, or if there is any worsening of symptoms, or if you have any additional concerns, please return for re-evaluation; or, if we are closed, consider going to the Emergency Room for evaluation if symptoms urgent.

## 2017-08-02 NOTE — Progress Notes (Signed)
Pre visit review using our clinic review tool, if applicable. No additional management support is needed unless otherwise documented below in the visit note. 

## 2017-08-02 NOTE — Progress Notes (Signed)
Subjective:    Patient ID: Sabrina Montoya, female    DOB: 1941-07-13, 76 y.o.   MRN: [id number]  CC: Sabrina Montoya is a 76 y.o. female who presents today for an acute visit.    HPI: CC: lower abdominal pressure x 2 weeks, worsening.   Endorses urinary frequency, occasional diarrhea or even 'little marbles' , nausea, vomited 2 times.  Eating and drinking normally.   Last BM today normal.   No blood in stool, hematuria, fever, chills, urinary incontinence  'feels like everything is going to fall on the floor.'   Just completed prednisone for left hip and back. Wasn't sure if it was low back.   H/o diverticulitis , hysterectomy  H/o bladder prolapse, had 'bladder tac' with pessary.    Follows with Dr Sharlett Iles, GYN  Colonoscopy 2015  Crt 02/2017 1.06  HISTORY:  Past Medical History:  Diagnosis Date  . Abdominal aortic aneurysm without mention of rupture    infrarenal, stable, folllowed by Leotis Pain  . Acute posthemorrhagic anemia   . Arthritis   . B12 deficiency   . CAD (coronary artery disease), autologous vein bypass graft   . Cardiac dysrhythmia, unspecified   . Colon polyp   . Depression   . Diverticulitis of colon   . Dizziness    DUE TO MEDICINES  . Dysrhythmia   . GERD (gastroesophageal reflux disease)   . HOH (hard of hearing)   . Hyperlipidemia   . Hypertension    pt denies. placed on meds after CABG  . IBS (irritable bowel syndrome)   . Neuropathy   . Peripheral vascular disease ([place])    s/p CEA   . Reflux esophagitis   . Rheumatic fever    possible at age 24  . Sliding hiatal hernia   . Tobacco abuse   . Tobacco abuse    Past Surgical History:  Procedure Laterality Date  . ABDOMINAL SURGERY  1976   for pain secondary to scar tissue, s/p apply  . APPENDECTOMY  1974  . BACK SURGERY    . CAROTID ENDARTERECTOMY    . CATARACT EXTRACTION W/PHACO Right 10/18/2016   Procedure: CATARACT EXTRACTION PHACO AND INTRAOCULAR LENS PLACEMENT (IOC);  Surgeon: Estill Cotta, MD;  Location: [facility] ORS;  Service: Ophthalmology;  Laterality: Right;  Lot # [id number] H [place]: 01:04.0 AP%: 24.4 CDE: 30.44  . CATARACT EXTRACTION W/PHACO Left 03/14/2017   Procedure: CATARACT EXTRACTION PHACO AND INTRAOCULAR LENS PLACEMENT ([place])  Left;  Surgeon: Leandrew Koyanagi, MD;  Location: [place];  Service: Ophthalmology;  Laterality: Left;  . CEA     Carotid stenosis found during workup for dysphagia,  [facility]  . 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 CHOLECYSTECTOMY  2002   Dr. Pat Patrick  . CLOSED MANIPULATION SHOULDER     left shoulder post physical therapy, Right shoulder redo (toggle bolts)  . COLONOSCOPY  09/01/14  . coronary angiography  01/2011   one occluded artery with collateralization, other arteries patent  . CORONARY ARTERY BYPASS GRAFT  2006   s/p triple bypass surgery, [facility]  . heart catherization  2016  . HERNIA REPAIR  may 2011   Dr. Pat Patrick  . hernia repair    . LUMBAR DISC SURGERY  1971   L5, unssuccessful, fusion in 1983 successful (2 lumbar)  . ROTATOR CUFF REPAIR  2002   right shoulder Dr. Nadean Corwin, [place] in [place]  .  VASCULAR SURGERY     Family History  Problem Relation Age of Onset  . Cancer Sister        cervical cancer  . Diabetes Sister   . Heart disease Brother        coronary artery disease  . Other Brother        suicide  . Diabetes Mother   . Heart disease Mother   . Diabetes Sister   . Other Father        suicide  . Cancer Sister        breast  . Other Other        colon resection due to inflammation -nephew    Allergies: Niacin and related; Sertraline hcl; Sulfa drugs cross reactors; Tetracyclines & related; Latex; and Simvastatin Current Outpatient Prescriptions on File Prior to Visit  Medication Sig Dispense Refill  . aspirin 81 MG tablet Take 81 mg by mouth daily.    Marland Kitchen buPROPion (WELLBUTRIN XL) 150 MG 24 hr tablet TAKE 1 TABLET BY  MOUTH ONCE EVERY MORNING 30 tablet 3  . cholecalciferol (VITAMIN D) 1000 UNITS tablet Take 1,000 Units by mouth daily.    . clonazePAM (KLONOPIN) 0.5 MG tablet TAKE 1 TABLET BY MOUTH BEDTIME 30 tablet 3  . dicyclomine (BENTYL) 20 MG tablet TAKE 1 TABLET BY MOUTH TWICE DAILY. 60 tablet 3  . glucose blood test strip Use as instructed to test blood sugars with One Touch Glucometer. Dx: E11.9 200 each 5  . isosorbide mononitrate (IMDUR) 30 MG 24 hr tablet Take 30 mg by mouth daily.    . Multiple Vitamins-Minerals (PRESERVISION AREDS 2 PO) Take by mouth 2 (two) times daily.    Marland Kitchen omeprazole (PRILOSEC) 20 MG capsule Take 1 capsule (20 mg total) by mouth daily. 30 capsule 2  . ONE TOUCH LANCETS MISC Use as directed to test blood sugars with One Touch Glucometer.  Dx: E11.9 200 each 5   No current facility-administered medications on file prior to visit.     Social History  Substance Use Topics  . Smoking status: Former Smoker    Packs/day: 0.50    Years: 30.00    Types: Cigarettes    Quit date: 04/01/2012  . Smokeless tobacco: Never Used     Comment: quit for 2 years after sinus infection and 6 months after heart surgery  . Alcohol use No    Review of Systems  Constitutional: Negative for chills and fever.  Respiratory: Negative for cough.   Cardiovascular: Negative for chest pain and palpitations.  Gastrointestinal: Positive for abdominal pain, diarrhea, nausea and vomiting.  Genitourinary: Positive for frequency. Negative for flank pain, hematuria, pelvic pain, vaginal bleeding, vaginal discharge and vaginal pain.      Objective:    BP 136/68   Pulse 72   Temp 98.7 F (37.1 C) (Oral)   Ht 5\' 5"  (1.651 m)   Wt 157 lb 9.6 oz (71.5 kg)   SpO2 98%   BMI 26.23 kg/m    Physical Exam  Constitutional: She appears well-developed and well-nourished.  Eyes: Conjunctivae are normal.  Cardiovascular: Normal rate, regular rhythm, normal heart sounds and normal pulses.   Pulmonary/Chest:  Effort normal and breath sounds normal. She has no wheezes. She has no rhonchi. She has no rales.  Abdominal: Soft. Normal appearance and bowel sounds are normal. She exhibits no distension, no fluid wave, no ascites and no mass. There is tenderness in the suprapubic area. There is no rigidity, no rebound, no  guarding, no CVA tenderness, no tenderness at McBurney's point and negative Murphy's sign.  Genitourinary: There is no rash, tenderness or lesion on the right labia. There is no rash, tenderness or lesion on the left labia. Right adnexum displays no mass, no tenderness and no fullness. Left adnexum displays no mass, no tenderness and no fullness. No erythema, tenderness or bleeding in the vagina. No foreign body in the vagina. No vaginal discharge found.  Genitourinary Comments: No vulvovaginal erythema. No lesions. Discharge is thin and clear, not purulent.  Pink soft tissues coming through vaginal opening.   Neurological: She is alert.  Skin: Skin is warm and dry.  Psychiatric: She has a normal mood and affect. Her speech is normal and behavior is normal. Thought content normal.  Vitals reviewed.      Assessment & Plan:   1. Abdominal pressure Afebrile. Patient is well-appearing. Differentials include urinary tract infection,diverticulitis, BV. Also have concern for bladder prolapse due to history of hysterectomy and also bladder surgeries since then.Patient and I had long discussion , she would most comfortable following with her current gynecologist regarding any concerns about prolapse; she will call her to schedule a follow-up appointment.   her pending urine studies, stat CT abdomen. Note, Patient states in the past she required 14 days of doing dual antibiotic  therapy for diverticulitis. Return precautions given.   - Urinalysis, Routine w reflex microscopic - CULTURE, URINE COMPREHENSIVE - CT ABDOMEN PELVIS W CONTRAST - Cervicovaginal ancillary only  - CT ABDOMEN PELVIS W  CONTRAST    I have discontinued Ms. Suddeth's nitroGLYCERIN, metoprolol tartrate, and rosuvastatin. I am also having her maintain her cholecalciferol, aspirin, isosorbide mononitrate, glucose blood, ONE TOUCH LANCETS, dicyclomine, Multiple Vitamins-Minerals (PRESERVISION AREDS 2 PO), clonazePAM, buPROPion, omeprazole, and tiZANidine.   Meds ordered this encounter  Medications  . tiZANidine (ZANAFLEX) 2 MG tablet    Sig: Take by mouth.    Return precautions given.   Risks, benefits, and alternatives of the medications and treatment plan prescribed today were discussed, and patient expressed understanding.   Education regarding symptom management and diagnosis given to patient on AVS.  Continue to follow with Crecencio Mc, MD for routine health maintenance.   Wynona Neat and I agreed with plan.   Mable Paris, FNP

## 2017-08-03 LAB — CERVICOVAGINAL ANCILLARY ONLY: WET PREP (BD AFFIRM): POSITIVE — AB

## 2017-08-03 LAB — CULTURE, URINE COMPREHENSIVE
MICRO NUMBER:: 81073046
SPECIMEN QUALITY:: ADEQUATE

## 2017-08-06 ENCOUNTER — Telehealth: Payer: Self-pay | Admitting: Family

## 2017-08-06 NOTE — Telephone Encounter (Signed)
Hey tonya!  Where is the C & S report with this urine????  Would you be able to request for me?   Did I order it wrong?

## 2017-08-06 NOTE — Telephone Encounter (Signed)
They are going to try to add it. But this is not the way we order Urine Cultures. The correct code is LAB239. This will ensure that you get the sensitivities each time.

## 2017-08-07 ENCOUNTER — Telehealth: Payer: Self-pay | Admitting: Internal Medicine

## 2017-08-07 DIAGNOSIS — B370 Candidal stomatitis: Secondary | ICD-10-CM

## 2017-08-07 MED ORDER — FLUCONAZOLE 150 MG PO TABS: 150.0000 mg | ORAL_TABLET | Freq: Once | ORAL | 1 refills | Status: AC

## 2017-08-07 NOTE — Telephone Encounter (Signed)
Pt called about having some kind of reaction to medication of metroNIDAZOLE (FLAGYL) 500 MG tablet and ciprofloxacin (CIPRO) 500 MG tablet. Side effects are dry mouth ,sore throat, top of mouth is sore and tongue is white. Pt was transferred to [facility] Please advise?  Call pt @ [phone]. Thank you!

## 2017-08-07 NOTE — Telephone Encounter (Signed)
Please advise 

## 2017-08-07 NOTE — Telephone Encounter (Signed)
Patient was given cipro and flagyl for diverticulitis. She started the medication last Thursday. She has been taking her medicine in applesauce until this morning. She called saying that the roof of her mouth, throat, and tongue is sore. Tongue is white. She is not having any swelling or acute symptoms. Her mouth has been sore since this morning. She has taken both of these medications in the past. Please advise.

## 2017-08-07 NOTE — Telephone Encounter (Signed)
Call pt  Sounds like a fungal infection ( thrush)  Flagyl can be a/w candidiasis.   Ensure no SOB, rash, trouble swallowing or breathing  Have sent in diflucan for patient as long as she is stable. Please have her call us tomorrow and let us know how she is doing

## 2017-08-07 NOTE — Telephone Encounter (Signed)
Patient Name: Sabrina Montoya  DOB: 12-27-1940    Initial Comment Caller states she's having a medication reaction. Cipro. Tongue white, sore throat,    Nurse Assessment  Nurse: Raphael Gibney, RN, Vanita Ingles Date/Time (Eastern Time): 08/07/2017 2:25:50 PM  Confirm and document reason for call. If symptomatic, describe symptoms. ---Caller states she is taking ciprofloxacin and flagyl for diverticulitis. Has a sore throat. The roof of her mouth is sore and her tongue is white. Had just finished some prednisone. She has been taking the flagyl and cipro with applesauce and swallowing it with the applesauce. Today the pill became loose from the applesauce. She is able to swallow. Started medication last Thursday. Noticed reaction this am. No fever.  Does the patient have any new or worsening symptoms? ---Yes  Will a triage be completed? ---Yes  Related visit to physician within the last 2 weeks? ---No  Does the PT have any chronic conditions? (i.e. diabetes, asthma, etc.) ---Yes  List chronic conditions. ---CABG; diverticulitis;  Is this a behavioral health or substance abuse call? ---No     Guidelines    Guideline Title Affirmed Question Affirmed Notes  Sore Throat [1] Sore throat is the only symptom AND [2] sore throat present < 48 hours    Final Disposition User   [facility] [place], RN, Vanita Ingles    Comments  pt wants to know if she should continue the flagyl TID because of the white coating on her tongue and her sore throat. please call pt back regarding medication.   Caller Disagree/Comply Comply  Caller Understands Yes  PreDisposition Call Doctor

## 2017-08-07 NOTE — Telephone Encounter (Signed)
FYI

## 2017-08-07 NOTE — Telephone Encounter (Signed)
Patient is aware. States she is not having rash, trouble swallowing, SOB, etc. Advised patient to go to ED if developed any of those symptoms. Patient agreed to comply. Will follow up tomorrow.

## 2017-08-08 NOTE — Telephone Encounter (Signed)
thanks

## 2017-08-08 NOTE — Telephone Encounter (Signed)
noted 

## 2017-08-10 ENCOUNTER — Telehealth (INDEPENDENT_AMBULATORY_CARE_PROVIDER_SITE_OTHER): Payer: Self-pay

## 2017-08-15 ENCOUNTER — Telehealth: Payer: Self-pay | Admitting: Internal Medicine

## 2017-08-15 ENCOUNTER — Emergency Department: Payer: Medicare Other

## 2017-08-15 ENCOUNTER — Emergency Department
Admission: EM | Admit: 2017-08-15 | Discharge: 2017-08-15 | Disposition: A | Discharge status: Home / Self Care | Payer: Medicare Other | Attending: Emergency Medicine | Admitting: Emergency Medicine

## 2017-08-15 DIAGNOSIS — Z79899 Other long term (current) drug therapy: Secondary | ICD-10-CM | POA: Diagnosis not present

## 2017-08-15 DIAGNOSIS — R197 Diarrhea, unspecified: Secondary | ICD-10-CM | POA: Diagnosis not present

## 2017-08-15 DIAGNOSIS — Z8673 Personal history of transient ischemic attack (TIA), and cerebral infarction without residual deficits: Secondary | ICD-10-CM | POA: Diagnosis not present

## 2017-08-15 DIAGNOSIS — Z7982 Long term (current) use of aspirin: Secondary | ICD-10-CM | POA: Diagnosis not present

## 2017-08-15 DIAGNOSIS — Z91040 Latex allergy status: Secondary | ICD-10-CM | POA: Insufficient documentation

## 2017-08-15 DIAGNOSIS — G8929 Other chronic pain: Secondary | ICD-10-CM | POA: Insufficient documentation

## 2017-08-15 DIAGNOSIS — E1122 Type 2 diabetes mellitus with diabetic chronic kidney disease: Secondary | ICD-10-CM | POA: Insufficient documentation

## 2017-08-15 DIAGNOSIS — I129 Hypertensive chronic kidney disease with stage 1 through stage 4 chronic kidney disease, or unspecified chronic kidney disease: Secondary | ICD-10-CM | POA: Diagnosis not present

## 2017-08-15 DIAGNOSIS — R112 Nausea with vomiting, unspecified: Secondary | ICD-10-CM

## 2017-08-15 DIAGNOSIS — N183 Chronic kidney disease, stage 3 (moderate): Secondary | ICD-10-CM | POA: Diagnosis not present

## 2017-08-15 DIAGNOSIS — I2510 Atherosclerotic heart disease of native coronary artery without angina pectoris: Secondary | ICD-10-CM | POA: Insufficient documentation

## 2017-08-15 DIAGNOSIS — R109 Unspecified abdominal pain: Secondary | ICD-10-CM | POA: Diagnosis present

## 2017-08-15 DIAGNOSIS — K5730 Diverticulosis of large intestine without perforation or abscess without bleeding: Secondary | ICD-10-CM | POA: Diagnosis not present

## 2017-08-15 LAB — COMPREHENSIVE METABOLIC PANEL
ALBUMIN: 3.9 g/dL (ref 3.5–5.0)
ALK PHOS: 71 U/L (ref 38–126)
ALT: 36 U/L (ref 14–54)
ANION GAP: 12 (ref 5–15)
AST: 53 U/L — ABNORMAL HIGH (ref 15–41)
BUN: 25 mg/dL — ABNORMAL HIGH (ref 6–20)
CALCIUM: 9.2 mg/dL (ref 8.9–10.3)
CHLORIDE: 102 mmol/L (ref 101–111)
CO2: 23 mmol/L (ref 22–32)
Creatinine, Ser: 1.3 mg/dL — ABNORMAL HIGH (ref 0.44–1.00)
GFR calc Af Amer: 45 mL/min — ABNORMAL LOW (ref >60)
GFR calc non Af Amer: 39 mL/min — ABNORMAL LOW (ref >60)
GLUCOSE: 152 mg/dL — AB (ref 65–99)
Potassium: 4.2 mmol/L (ref 3.5–5.1)
SODIUM: 137 mmol/L (ref 135–145)
Total Bilirubin: 0.8 mg/dL (ref 0.3–1.2)
Total Protein: 7.3 g/dL (ref 6.5–8.1)

## 2017-08-15 LAB — CBC
HCT: 32.7 % — ABNORMAL LOW (ref 35.0–47.0)
HEMOGLOBIN: 11.5 g/dL — AB (ref 12.0–16.0)
MCH: 29.5 pg (ref 26.0–34.0)
MCHC: 35.2 g/dL (ref 32.0–36.0)
MCV: 83.6 fL (ref 80.0–100.0)
Platelets: 249 10*3/uL (ref 150–440)
RBC: 3.92 MIL/uL (ref 3.80–5.20)
RDW: 14.8 % — ABNORMAL HIGH (ref 11.5–14.5)
WBC: 10.6 10*3/uL (ref 3.6–11.0)

## 2017-08-15 LAB — URINALYSIS, COMPLETE (UACMP) WITH MICROSCOPIC
BILIRUBIN URINE: NEGATIVE
Bacteria, UA: NONE SEEN
Glucose, UA: NEGATIVE mg/dL
HGB URINE DIPSTICK: NEGATIVE
Ketones, ur: NEGATIVE mg/dL
Nitrite: NEGATIVE
PH: 5 (ref 5.0–8.0)
Protein, ur: NEGATIVE mg/dL
SPECIFIC GRAVITY, URINE: 1.018 (ref 1.005–1.030)

## 2017-08-15 LAB — LIPASE, BLOOD: Lipase: 22 U/L (ref 11–51)

## 2017-08-15 LAB — MAGNESIUM: Magnesium: 1.7 mg/dL (ref 1.7–2.4)

## 2017-08-15 MED ORDER — ONDANSETRON 4 MG PO TBDP: 4.0000 mg | ORAL_TABLET | Freq: Three times a day (TID) | ORAL | 0 refills | Status: DC | PRN

## 2017-08-15 MED ORDER — SODIUM CHLORIDE 0.9 % IV SOLN
1000.0000 mL | Freq: Once | INTRAVENOUS | Status: AC
  Administered 2017-08-15: 1000 mL via INTRAVENOUS

## 2017-08-15 NOTE — ED Notes (Signed)
Spoke with lab regarding add-on magnesium level. Lab states it is in line to be run and they will pull it and run it now.

## 2017-08-15 NOTE — ED Provider Notes (Signed)
[facility] Emergency Department Provider Note   ____________________________________________    I have reviewed the triage vital signs and the nursing notes.   HISTORY  Chief Complaint Abdominal Pain     HPI Sabrina Montoya is a 76 y.o. female who presents with complaints of nausea vomiting diarrhea as well as abdominal pain. Patient reports 2 weeks ago she had a CT scan which showed mild diverticulitis, she has been on Cipro and Flagyl during that entire time. She reports she thinks the medicines have been causing her to feel nauseated and have diarrhea over the last 2 weeks however last night she had copious diarrhea and nausea and vomiting. Currently she feels significantly better. She reports occasional cramping in her abdomen but overall improvement. No fevers or chills. She does feel dehydrated   Past Medical History:  Diagnosis Date  . Abdominal aortic aneurysm without mention of rupture    infrarenal, stable, folllowed by Leotis Pain  . Acute posthemorrhagic anemia   . Arthritis   . B12 deficiency   . CAD (coronary artery disease), autologous vein bypass graft   . Cardiac dysrhythmia, unspecified   . Colon polyp   . Depression   . Diverticulitis of colon   . Dizziness    DUE TO MEDICINES  . Dysrhythmia   . GERD (gastroesophageal reflux disease)   . HOH (hard of hearing)   . Hyperlipidemia   . Hypertension    pt denies. placed on meds after CABG  . IBS (irritable bowel syndrome)   . Neuropathy   . Peripheral vascular disease ([place])    s/p CEA   . Reflux esophagitis   . Rheumatic fever    possible at age 15  . Sliding hiatal hernia   . Tobacco abuse   . Tobacco abuse     Patient Active Problem List   Diagnosis Date Noted  . Atrophic vaginitis 02/11/2017  . Neck swelling 10/05/2016  . CKD (chronic kidney disease) stage 3, GFR 30-59 ml/min (HCC) 08/24/2016  . Dysphagia, pharyngoesophageal phase 06/20/2016  . Sciatica of left side  06/20/2016  . Diabetes mellitus with stage 2 chronic kidney disease ([place]) 05/16/2016  . Medicare annual wellness visit, subsequent 02/06/2016  . Depression with somatization 01/27/2015  . Cervicalgia 01/09/2015  . Chronic pain of right hip 08/29/2014  . Cystocele 08/29/2014  . 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 Hot flashes 05/02/2014  . Anemia 05/02/2014  . TIA (transient ischemic attack) 09/11/2013  . Dizziness and giddiness 09/02/2013  . Arrhythmia 07/01/2013  . Neuropathy 07/01/2013  . Overweight (BMI 25.0-29.9) 03/16/2013  . Anxiety state 03/16/2013  . Chronic reflux esophagitis 12/29/2012  . Right hip pain 12/29/2012  . History of tobacco abuse 07/03/2012  . Hypomagnesemia 06/01/2012  . Bradycardia 06/01/2012  . AAA (abdominal aortic aneurysm) without rupture ([place])   . CAD (coronary artery disease), autologous vein bypass graft   . Peripheral vascular disease ([place])   . Arthritis 02/29/2012  . Asymptomatic carotid artery stenosis 01/05/2012  . Hyperlipidemia   . Osteopenia 08/29/2011  . Vitamin D deficiency 08/29/2011    Past Surgical History:  Procedure Laterality Date  . ABDOMINAL SURGERY  1976   for pain secondary to scar tissue, s/p apply  . APPENDECTOMY  1974  . BACK SURGERY    . CAROTID ENDARTERECTOMY    . CATARACT EXTRACTION W/PHACO Right 10/18/2016   Procedure: CATARACT EXTRACTION PHACO AND INTRAOCULAR LENS PLACEMENT (IOC);  Surgeon: Estill Cotta, MD;  Location: [facility];  Service: Ophthalmology;  Laterality: Right;  Lot # [id number] H [place]: 01:04.0 AP%: 24.4 CDE: 30.44  . CATARACT EXTRACTION W/PHACO Left 03/14/2017   Procedure: CATARACT EXTRACTION PHACO AND INTRAOCULAR LENS PLACEMENT ([place])  Left;  Surgeon: Leandrew Koyanagi, MD;  Location: [place];  Service: Ophthalmology;  Laterality: Left;  . CEA     Carotid stenosis found during workup for dysphagia,  [facility]  . CERVICAL DISC SURGERY  2009   Dr. Hoover Brunette for cervical cord stenosis,  C5-6 diskectomy  . CHOLECYSTECTOMY  2002   Dr. Pat Patrick  . CLOSED MANIPULATION SHOULDER     left shoulder post physical therapy, Right shoulder redo (toggle bolts)  . COLONOSCOPY  09/01/14  . coronary angiography  01/2011   one occluded artery with collateralization, other arteries patent  . CORONARY ARTERY BYPASS GRAFT  2006   s/p triple bypass surgery, [facility]  . heart catherization  2016  . HERNIA REPAIR  may 2011   Dr. Pat Patrick  . hernia repair    . LUMBAR DISC SURGERY  1971   L5, unssuccessful, fusion in 1983 successful (2 lumbar)  . ROTATOR CUFF REPAIR  2002   right shoulder Dr. Nadean Corwin, [place] in [place]      Prior to Admission medications   Medication Sig Start Date End Date Taking? Authorizing Provider  aspirin 81 MG tablet Take 81 mg by mouth daily.    [provider]  buPROPion (WELLBUTRIN XL) 150 MG 24 hr tablet TAKE 1 TABLET BY MOUTH ONCE EVERY MORNING 05/07/17   Crecencio Mc, MD  cholecalciferol (VITAMIN D) 1000 UNITS tablet Take 1,000 Units by mouth daily.    [provider]  ciprofloxacin (CIPRO) 500 MG tablet Take 1 tablet (500 mg total) by mouth 2 (two) times daily. 08/02/17 08/16/17  Burnard Hawthorne, FNP  clonazePAM (KLONOPIN) 0.5 MG tablet TAKE 1 TABLET BY MOUTH BEDTIME 05/07/17   Crecencio Mc, MD  dicyclomine (BENTYL) 20 MG tablet TAKE 1 TABLET BY MOUTH TWICE DAILY. 03/05/17   Crecencio Mc, MD  glucose blood test strip Use as instructed to test blood sugars with One Touch Glucometer. Dx: E11.9 06/12/16   Crecencio Mc, MD  isosorbide mononitrate (IMDUR) 30 MG 24 hr tablet Take 30 mg by mouth daily.    [provider]  metroNIDAZOLE (FLAGYL) 500 MG tablet Take 1 tablet (500 mg total) by mouth 3 (three) times daily. 08/02/17 08/16/17  Burnard Hawthorne, FNP  Multiple Vitamins-Minerals (PRESERVISION AREDS 2 PO) Take by mouth 2 (two) times daily.    [provider]  omeprazole (PRILOSEC) 20 MG  capsule Take 1 capsule (20 mg total) by mouth daily. 07/02/17   Crecencio Mc, MD  ondansetron (ZOFRAN ODT) 4 MG disintegrating tablet Take 1 tablet (4 mg total) by mouth every 8 (eight) hours as needed for nausea or vomiting. 08/15/17   Lavonia Drafts, MD  ONE TOUCH LANCETS MISC Use as directed to test blood sugars with One Touch Glucometer.  Dx: E11.9 06/12/16   Crecencio Mc, MD     Allergies Niacin and related; Sertraline hcl; Sulfa drugs cross reactors; Tetracyclines & related; Latex; and Simvastatin  Family History  Problem Relation Age of Onset  . Cancer Sister        cervical cancer  . Diabetes Sister   . Heart disease Brother        coronary artery disease  . Other Brother  suicide  . Diabetes Mother   . Heart disease Mother   . Diabetes Sister   . Other Father        suicide  . Cancer Sister        breast  . Other Other        colon resection due to inflammation -nephew    Social History Social History  Substance Use Topics  . Smoking status: Former Smoker    Packs/day: 0.50    Years: 30.00    Types: Cigarettes    Quit date: 04/01/2012  . Smokeless tobacco: Never Used     Comment: quit for 2 years after sinus infection and 6 months after heart surgery  . Alcohol use No    Review of Systems  Constitutional: No fever/chills Eyes: No visual changes.  ENT: No sore throat. Cardiovascular: Denies chest pain. Respiratory: Denies shortness of breath. Gastrointestinal: As above Genitourinary: Negative for dysuria. Musculoskeletal: Negative for back pain. Skin: Negative for rash. Neurological: Negative for headaches    ____________________________________________   PHYSICAL EXAM:  VITAL SIGNS: ED Triage Vitals  Enc Vitals Group     BP 08/15/17 1015 (!) 109/54     Pulse Rate 08/15/17 1011 87     Resp 08/15/17 1011 20     Temp 08/15/17 1011 98.6 F (37 C)     Temp Source 08/15/17 1011 Oral     SpO2 08/15/17 1011 98 %     Weight 08/15/17 1013  69.4 kg (153 lb)     Height 08/15/17 1013 1.651 m (5\' 5" )     Head Circumference --      Peak Flow --      Pain Score 08/15/17 1015 2     Pain Loc --      Pain Edu? --      Excl. in [place]? --     Constitutional: Alert and oriented. No acute distress. Pleasant and interactive Eyes: Conjunctivae are normal.   Nose: No congestion/rhinnorhea. Mouth/Throat: Mucous membranes are moist.    Cardiovascular: Normal rate, regular rhythm. Grossly normal heart sounds.  Good peripheral circulation. Respiratory: Normal respiratory effort.  No retractions. Lungs CTAB. Gastrointestinal: Soft and nontender. No distention.  No CVA tenderness. Genitourinary: deferred Musculoskeletal:  Warm and well perfused Neurologic:  Normal speech and language. No gross focal neurologic deficits are appreciated.  Skin:  Skin is warm, dry and intact. No rash noted. Psychiatric: Mood and affect are normal. Speech and behavior are normal.  ____________________________________________   LABS (all labs ordered are listed, but only abnormal results are displayed)  Labs Reviewed  COMPREHENSIVE METABOLIC PANEL - Abnormal; Notable for the following:       Result Value   Glucose, Bld 152 (*)    BUN 25 (*)    Creatinine, Ser 1.30 (*)    AST 53 (*)    GFR calc non Af Amer 39 (*)    GFR calc Af Amer 45 (*)    All other components within normal limits  CBC - Abnormal; Notable for the following:    Hemoglobin 11.5 (*)    HCT 32.7 (*)    RDW 14.8 (*)    All other components within normal limits  URINALYSIS, COMPLETE (UACMP) WITH MICROSCOPIC - Abnormal; Notable for the following:    Color, Urine AMBER (*)    APPearance CLEAR (*)    Leukocytes, UA TRACE (*)    Squamous Epithelial / LPF 0-5 (*)    All other components within normal limits  LIPASE, BLOOD  MAGNESIUM   ____________________________________________  EKG  Non ____________________________________________  RADIOLOGY  CT renal stone study,  inflammation resolved ____________________________________________   PROCEDURES  Procedure(s) performed: No    Critical Care performed: No ____________________________________________   INITIAL IMPRESSION / ASSESSMENT AND PLAN / ED COURSE  Pertinent labs & imaging results that were available during my care of the patient were reviewed by me and considered in my medical decision making (see chart for details).  Patient presents with nausea vomiting diarrhea which developed rather abruptly last night. She is feeling significant better while in the emergency department. No further episodes of vomiting. Abdominal exam is reassuring. Lab work is unremarkable.  Differential diagnosis includes worsening of diverticulitis/diverticular abscess, gastroenteritis, medication reaction  Reimaging necessary to evaluate whether or diverticulitis has worsened  CT scan shows resolved inflammation, no other acute findings  Patient feels better after IV fluids. Recommend follow-up with her PCP. Return precautions discussed    ____________________________________________   FINAL CLINICAL IMPRESSION(S) / ED DIAGNOSES  Final diagnoses:  Nausea vomiting and diarrhea      NEW MEDICATIONS STARTED DURING THIS VISIT:  Discharge Medication List as of 08/15/2017  1:26 PM    START taking these medications   Details  ondansetron (ZOFRAN ODT) 4 MG disintegrating tablet Take 1 tablet (4 mg total) by mouth every 8 (eight) hours as needed for nausea or vomiting., Starting Wed 08/15/2017, Print         Note:  This document was prepared using Dragon voice recognition software and may include unintentional dictation errors.    Lavonia Drafts, MD 08/15/17 [id number]

## 2017-08-15 NOTE — Telephone Encounter (Signed)
Pt stated that she is feeling better whould you still like for her to come in for a follow up with you?

## 2017-08-15 NOTE — Telephone Encounter (Signed)
Pt called and stated that she went to the ed today for dehydration and diverticulitis. Pt states that they did a CT scan and everything looks good. Was advised to make an appt with Dr. Derrel Nip. Pt not sure why she really needs it, she is feeling better but wanted to check with Dr. Derrel Nip. Please advise, thank you!  Call pt @ [phone]

## 2017-08-15 NOTE — Telephone Encounter (Signed)
If she is feeling better ,  Given the storm coming,  She does not need to come in .  I will review the ER report and let her know if anythign needs to be repeated .    Send this back to me as a reminder

## 2017-08-15 NOTE — ED Triage Notes (Signed)
Pt states she is here for abd pain from her diverticulitis. Pt states she has been taking flagyl and cipro for 2 weeks now.

## 2017-08-16 NOTE — Telephone Encounter (Signed)
LMTCB

## 2017-08-17 NOTE — Telephone Encounter (Signed)
See other mesage 

## 2017-08-17 NOTE — Telephone Encounter (Signed)
Attempted to call pt- No answer/No voicemail.  

## 2017-08-17 NOTE — Telephone Encounter (Signed)
Pt lvm stating she is rtc jessica's call.

## 2017-08-17 NOTE — Telephone Encounter (Addendum)
If she is having 3 or more watery stools in the last 24 hours.  She may have  c dicile colitis.  She needs to submit stool (if she is having liquid stools) for testing.

## 2017-08-17 NOTE — Telephone Encounter (Signed)
Pt stated that she has been better today but that she will call us on Monday to let us know how she does over the weekend and come pick up the stool kit then.

## 2017-08-17 NOTE — Telephone Encounter (Signed)
LMTCB

## 2017-08-17 NOTE — Telephone Encounter (Signed)
Spoke with pt and she stated that she is not having the pain anymore but she is still having to run to the bathroom every time she eats.

## 2017-08-17 NOTE — Addendum Note (Signed)
Addended by: Crecencio Mc on: 08/17/2017 12:44 PM   Modules accepted: Orders

## 2017-08-21 DIAGNOSIS — L821 Other seborrheic keratosis: Secondary | ICD-10-CM | POA: Diagnosis not present

## 2017-08-21 DIAGNOSIS — L814 Other melanin hyperpigmentation: Secondary | ICD-10-CM | POA: Diagnosis not present

## 2017-08-21 DIAGNOSIS — D225 Melanocytic nevi of trunk: Secondary | ICD-10-CM | POA: Diagnosis not present

## 2017-08-21 DIAGNOSIS — D1801 Hemangioma of skin and subcutaneous tissue: Secondary | ICD-10-CM | POA: Diagnosis not present

## 2017-08-21 DIAGNOSIS — Z85828 Personal history of other malignant neoplasm of skin: Secondary | ICD-10-CM | POA: Diagnosis not present

## 2017-08-31 ENCOUNTER — Other Ambulatory Visit: Payer: Self-pay | Admitting: *Deleted

## 2017-08-31 DIAGNOSIS — I6523 Occlusion and stenosis of bilateral carotid arteries: Secondary | ICD-10-CM

## 2017-09-14 ENCOUNTER — Other Ambulatory Visit (INDEPENDENT_AMBULATORY_CARE_PROVIDER_SITE_OTHER): Payer: Medicare Other

## 2017-09-14 ENCOUNTER — Encounter (INDEPENDENT_AMBULATORY_CARE_PROVIDER_SITE_OTHER): Payer: Medicare Other

## 2017-09-14 ENCOUNTER — Ambulatory Visit (INDEPENDENT_AMBULATORY_CARE_PROVIDER_SITE_OTHER): Payer: Medicare Other | Admitting: Vascular Surgery

## 2017-10-02 ENCOUNTER — Other Ambulatory Visit: Payer: Self-pay | Admitting: Internal Medicine

## 2017-10-08 DIAGNOSIS — I6523 Occlusion and stenosis of bilateral carotid arteries: Secondary | ICD-10-CM | POA: Diagnosis not present

## 2017-10-08 DIAGNOSIS — I6521 Occlusion and stenosis of right carotid artery: Secondary | ICD-10-CM | POA: Diagnosis not present

## 2017-10-09 DIAGNOSIS — H353131 Nonexudative age-related macular degeneration, bilateral, early dry stage: Secondary | ICD-10-CM | POA: Diagnosis not present

## 2017-10-16 ENCOUNTER — Other Ambulatory Visit: Payer: Self-pay | Admitting: Internal Medicine

## 2017-10-22 ENCOUNTER — Other Ambulatory Visit: Payer: Self-pay | Admitting: Internal Medicine

## 2017-10-25 ENCOUNTER — Telehealth: Payer: Self-pay | Admitting: Internal Medicine

## 2017-10-25 NOTE — Telephone Encounter (Signed)
Patient called in requesting refill on Metoprol, which is not on her medication profile in her chart, explained to the patient that a note would be sent over to the provider with her question, she asked to speak to [facility] at the office, Juliann Pulse was called and the patient transferred to her.

## 2017-10-25 NOTE — Telephone Encounter (Signed)
Per 9/18 note patient saw NP please find pasted below note from chart    I have discontinued Ms. Omdahl's nitroGLYCERIN, metoprolol tartrate, and rosuvastatin. I am also having her maintain her cholecalciferol, aspirin, isosorbide mononitrate, glucose blood, ONE TOUCH LANCETS, dicyclomine, Multiple Vitamins-Minerals (PRESERVISION AREDS 2 PO), clonazePAM, buPROPion, omeprazole, and tiZANidine.   Please advise OK to fill?

## 2017-10-26 MED ORDER — METOPROLOL TARTRATE 25 MG PO TABS: 12.5000 mg | ORAL_TABLET | Freq: Two times a day (BID) | ORAL | 3 refills | Status: DC

## 2017-10-26 NOTE — Addendum Note (Signed)
Addended by: Crecencio Mc on: 10/26/2017 01:28 PM   Modules accepted: Orders

## 2017-10-26 NOTE — Telephone Encounter (Addendum)
Patient should be taking metoprolol .  I have refilled .  Please schedule appt with me .  Last seen in April by me.

## 2017-10-26 NOTE — Telephone Encounter (Signed)
Spoke with pt and informed her that Dr. Derrel Nip refilled her medication. Also scheduled the pt an office visit with Dr. Derrel Nip in January. Pt is aware of appt date and time.

## 2017-10-27 ENCOUNTER — Other Ambulatory Visit: Payer: Self-pay | Admitting: Internal Medicine

## 2017-10-29 NOTE — Telephone Encounter (Signed)
Refilled: 05/07/2017 Last OV: 08/02/2017 Next OV: 11/12/2017

## 2017-10-29 NOTE — Telephone Encounter (Signed)
Rx faxed to [facility] 

## 2017-10-29 NOTE — Telephone Encounter (Signed)
Refill ok. Please fax.

## 2017-11-09 ENCOUNTER — Ambulatory Visit (INDEPENDENT_AMBULATORY_CARE_PROVIDER_SITE_OTHER): Payer: Medicare Other | Admitting: Internal Medicine

## 2017-11-09 ENCOUNTER — Encounter: Payer: Self-pay | Admitting: Internal Medicine

## 2017-11-09 ENCOUNTER — Ambulatory Visit (INDEPENDENT_AMBULATORY_CARE_PROVIDER_SITE_OTHER): Payer: Medicare Other

## 2017-11-09 VITALS — BP 112/74 | HR 66 | Temp 98.5°F | Resp 15 | Ht 65.0 in | Wt 153.6 lb

## 2017-11-09 DIAGNOSIS — E1122 Type 2 diabetes mellitus with diabetic chronic kidney disease: Secondary | ICD-10-CM | POA: Diagnosis not present

## 2017-11-09 DIAGNOSIS — E663 Overweight: Secondary | ICD-10-CM

## 2017-11-09 DIAGNOSIS — K5732 Diverticulitis of large intestine without perforation or abscess without bleeding: Secondary | ICD-10-CM | POA: Insufficient documentation

## 2017-11-09 DIAGNOSIS — R1030 Lower abdominal pain, unspecified: Secondary | ICD-10-CM

## 2017-11-09 DIAGNOSIS — Z23 Encounter for immunization: Secondary | ICD-10-CM | POA: Diagnosis not present

## 2017-11-09 DIAGNOSIS — E7800 Pure hypercholesterolemia, unspecified: Secondary | ICD-10-CM

## 2017-11-09 DIAGNOSIS — N183 Chronic kidney disease, stage 3 unspecified: Secondary | ICD-10-CM

## 2017-11-09 DIAGNOSIS — I129 Hypertensive chronic kidney disease with stage 1 through stage 4 chronic kidney disease, or unspecified chronic kidney disease: Secondary | ICD-10-CM

## 2017-11-09 DIAGNOSIS — N182 Chronic kidney disease, stage 2 (mild): Secondary | ICD-10-CM

## 2017-11-09 MED ORDER — DICYCLOMINE HCL 20 MG PO TABS: 20.0000 mg | ORAL_TABLET | Freq: Two times a day (BID) | ORAL | 3 refills | Status: DC

## 2017-11-09 NOTE — Assessment & Plan Note (Addendum)
Current symptoms are not suggestive of recurrence.  And last CT suggested resolution of inflammation .  Plain films suggest constipation.  Will treat with lactulose and reassess.

## 2017-11-09 NOTE — Progress Notes (Addendum)
Subjective:  Patient ID: Sabrina Montoya, female    DOB: Nov 25, 1940  Age: 77 y.o. MRN: [id number]  CC: The primary encounter diagnosis was Pure hypercholesterolemia. 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  HPI Sabrina Montoya presents for  Follow up on recurrent abdominal pain attributed to diverticulitis in the past .  S  Symptoms started in September .   CT scan confirmed itis.    Treated with cipro  and flagyl .  Developed  Sudden onset of nausea vomiting and diarrhea and went to ER on Oct 10th  And was reimaged. With non contrasted CT scan showing resolution of sigmoid diverticulitis.  She was  given IV fluids and sent home,  But continued to have diarrhea,  Did not come in for c dificile testing as recommended in October .  Symptoms currently include LLQ pain and suprapubic pain  aggravated by position change  Stools are solid marbles  Most of the time,  Has a normal caliber stool less than one a week .  No fevers, constantly having nausea.  No liquid stools in the last 2 weeks `  Having reflux with all foods.  crestor and bentyl  Cad were reportedly stopped by Joycelyn Schmid but there is no documentation in the chart . Patient has known CAD.     Taking metoprolol   Outpatient Medications Prior to Visit  Medication Sig Dispense Refill  . aspirin 81 MG tablet Take 81 mg by mouth daily.    Marland Kitchen buPROPion (WELLBUTRIN XL) 150 MG 24 hr tablet TAKE 1 TABLET BY MOUTH ONCE EVERY MORNING 30 tablet 2  . cholecalciferol (VITAMIN D) 1000 UNITS tablet Take 1,000 Units by mouth daily.    . clonazePAM (KLONOPIN) 0.5 MG tablet TAKE 1 TABLET BY MOUTH AT BEDTIME 30 tablet 0  .  isosorbide mononitrate (IMDUR) 30 MG 24 hr tablet Take 30 mg by mouth daily.    . metoprolol tartrate (LOPRESSOR) 25 MG tablet Take 0.5 tablets (12.5 mg total) by mouth 2 (two) times daily. 30 tablet 3  . Multiple Vitamins-Minerals (PRESERVISION AREDS 2 PO) Take by mouth 2 (two) times daily.    Marland Kitchen omeprazole (PRILOSEC) 20 MG capsule Take 1 capsule (20 mg total) by mouth daily. (Patient not taking: Reported on 11/09/2017) 30 capsule 2  . dicyclomine (BENTYL) 20 MG tablet TAKE 1 TABLET BY MOUTH TWICE DAILY. (Patient not taking: Reported on 11/09/2017) 60 tablet 3  . glucose blood test strip Use as instructed to test blood sugars with One Touch Glucometer. Dx: E11.9 (Patient not taking: Reported on 11/09/2017) 200 each 5  . ondansetron (ZOFRAN ODT) 4 MG disintegrating tablet Take 1 tablet (4 mg total) by mouth every 8 (eight) hours as needed for nausea or vomiting. (Patient not taking: Reported on 11/09/2017) 20 tablet 0  . ONE TOUCH LANCETS MISC Use as directed to test blood sugars with One Touch Glucometer.  Dx: E11.9 (Patient not taking: Reported on 11/09/2017) 200 each 5   No facility-administered medications prior to visit.     Review of Systems;  Patient denies headache, fevers, malaise, unintentional weight loss, skin rash, eye pain, sinus congestion and sinus pain, sore throat, dysphagia,  hemoptysis , cough,  dyspnea, wheezing, chest pain, palpitations, orthopnea, edema, abdominal pain, nausea, melena, diarrhea, constipation, flank pain, dysuria, hematuria, urinary  Frequency, nocturia, numbness, tingling, seizures,  Focal weakness, Loss of consciousness,  Tremor, insomnia, depression, anxiety, and suicidal ideation.      Objective:  BP 112/74 (BP Location: Left Arm, Patient Position: Sitting, Cuff Size: Normal)   Pulse 66   Temp 98.5 F (36.9 C) (Oral)   Resp 15   Ht 5\' 5"  (1.651 m)   Wt 153 lb 9.6 oz (69.7 kg)   SpO2 97%   BMI 25.56 kg/m   BP Readings from Last 3 Encounters:  11/09/17  112/74  08/15/17 (!) 128/59  08/02/17 136/68    Wt Readings from Last 3 Encounters:  11/09/17 153 lb 9.6 oz (69.7 kg)  08/15/17 153 lb (69.4 kg)  08/02/17 157 lb 9.6 oz (71.5 kg)    General appearance: alert, cooperative and appears stated age Ears: normal TM's and external ear canals both ears Throat: lips, mucosa, and tongue normal; teeth and gums normal Neck: no adenopathy, no carotid bruit, supple, symmetrical, trachea midline and thyroid not enlarged, symmetric, no tenderness/mass/nodules Back: symmetric, no curvature. ROM normal. No CVA tenderness. Lungs: clear to auscultation bilaterally Heart: regular rate and rhythm, S1, S2 normal, no murmur, click, rub or gallop Abdomen: soft, non-tender; bowel sounds normal; no masses,  no organomegaly Pulses: 2+ and symmetric Skin: Skin color, texture, turgor normal. No rashes or lesions Lymph nodes: Cervical, supraclavicular, and axillary nodes normal.  Lab Results  Component Value Date   HGBA1C 6.4 11/13/2017   HGBA1C 6.7 (H) 02/09/2017   HGBA1C 6.2 02/09/2017    Lab Results  Component Value Date   CREATININE 1.15 11/13/2017   CREATININE 1.30 (H) 08/15/2017   CREATININE 1.10 (H) 08/02/2017    Lab Results  Component Value Date   WBC 3.5 (L) 11/13/2017   HGB 11.6 (L) 11/13/2017   HCT 35.0 (L) 11/13/2017   PLT 227.0 11/13/2017   GLUCOSE 137 (H) 11/13/2017   CHOL 212 (H) 11/13/2017   TRIG 245.0 (H) 11/13/2017   HDL 32.80 (L) 11/13/2017   LDLDIRECT 137.0 11/13/2017   LDLCALC 53 02/09/2017   ALT 14 11/13/2017   AST 23 11/13/2017   NA 141 11/13/2017   K 4.5 11/13/2017   CL 106 11/13/2017   CREATININE 1.15 11/13/2017   BUN 24 (H) 11/13/2017   CO2 28 11/13/2017   TSH 1.79 03/07/2016   HGBA1C 6.4 11/13/2017   MICROALBUR 0.8 11/13/2017    Ct Renal Stone Study  Result Date: 08/15/2017 CLINICAL DATA:  Unspecified abdominal pain. Suspected diverticulitis. EXAM: CT ABDOMEN AND PELVIS WITHOUT CONTRAST TECHNIQUE:  Multidetector CT imaging of the abdomen and pelvis was performed following the standard protocol without IV contrast. COMPARISON:  08/02/2017 FINDINGS: Lower chest:  No acute finding.  Mitral annular calcification. Hepatobiliary: No focal liver abnormality.Cholecystectomy. Normal common bile duct diameter. Pancreas: Unremarkable. Spleen: Unremarkable. Adrenals/Urinary Tract: Negative adrenals. Elongated parenchymal calcification in the right anterior hilar lip measuring up to 7 mm in length. Bilateral renal cystic densities. Tiny high-density areas superficially within the bilateral renal cortex which are likely proteinaceous cysts. Status post contrast-enhanced CT 08/02/2017. No hydronephrosis. Unremarkable bladder. Stomach/Bowel: Extensive colonic diverticulosis. Inflammation at the descending sigmoid junction seen previously is resolved. Appendectomy.  Negative for bowel obstruction Vascular/Lymphatic: Extensive atherosclerotic calcification of the aorta and its branches. There is fusiform aneurysmal enlargement of the infrarenal aorta measuring up to 3.8 cm diameter. No mass or adenopathy. Reproductive:Hysterectomy.  Negative adnexae.  Other: No ascites or pneumoperitoneum. Musculoskeletal: No acute or aggressive finding. Diffuse spinal degeneration with postoperative changes to the lumbar posterior elements. IMPRESSION: 1. No acute finding. Resolved inflammation at the descending sigmoid junction. 2. Extensive distal colonic diverticulosis. 3.  Aortic Atherosclerosis (ICD10-I70.0). 4. Aortic aneurysm NOS (ICD10-I71.9). Maximal infrarenal diameter is 3.8 cm. Recommend followup by ultrasound in 2 years. This recommendation follows ACR consensus guidelines: White Paper of the ACR Incidental Findings Committee II on Vascular Findings. J Am Coll Radiol 2013; 10:789-794. Electronically Signed   By: Monte Fantasia M.D.   On: 08/15/2017 12:34    Assessment & Plan:   Problem List Items Addressed This Visit     Overweight (BMI 25.0-29.9)   CKD (chronic kidney disease) stage 3, GFR 30-59 ml/min (HCC)     Stable.  She is avoiding NSAIDs..  She is on an ARB for control of hypertension,, and will resume statin for control of hyperlipidemia.  Lab Results  Component Value Date   CREATININE 1.30 (H) 08/15/2017   Lab Results  Component Value Date   NA 137 08/15/2017   K 4.2 08/15/2017   CL 102 08/15/2017   CO2 23 08/15/2017   Lab Results  Component Value Date   MICROALBUR <0.7 08/22/2016         Diverticulitis large intestine    Current symptoms are not suggestive of recurrence.  And last CT suggested resolution of inflammation .  Plain films suggest constipation.  Will treat with lactulose and reassess.        Relevant Orders   CBC w/Diff (Completed)   Sedimentation rate (Completed)   Hyperlipidemia - Primary    With known CAD. .I see no evidence  In the chart that supports suspension of Crestor,  So I have advised her to resume the medication.        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 Patient is up-to-date on eye exams and overdue for labs.  foot exam has been done today. Patient has no history of microalbuminuria. Patient is tolerating statin therapy for CAD risk reduction and on ACE/ARB for renal protection and hypertension .    Lab Results  Component Value Date   HGBA1C 6.7 (H) 02/09/2017   Lab Results  Component Value Date   MICROALBUR <0.7 08/22/2016   Lab Results  Component Value Date   CREATININE 1.30 (H) 08/15/2017          Other Visit Diagnoses    Lower abdominal pain       Relevant Orders   DG Abd 1 View (Completed)   Encounter for immunization       Relevant Orders   Flu vaccine HIGH DOSE PF (Completed)     A total of 40 minutes was spent with patient more than half of which was spent in counseling patient on  the above mentioned issues , reviewing and explaining recent labs and imaging studies done, and coordination of care.  I have discontinued Nyjah H. [facility]aren's glucose blood, ONE TOUCH LANCETS, and ondansetron. I have also changed her dicyclomine. Additionally, I am having her maintain her cholecalciferol, aspirin, isosorbide mononitrate, Multiple Vitamins-Minerals (PRESERVISION AREDS 2 PO), omeprazole, buPROPion, metoprolol tartrate, and clonazePAM.  Meds ordered this encounter  Medications  . dicyclomine (BENTYL) 20 MG tablet    Sig: Take 1 tablet (20 mg total)  by mouth 2 (two) times daily.    Dispense:  60 tablet    Refill:  3    NEED ASAP    Medications Discontinued During This Encounter  Medication Reason  . glucose blood test strip Patient has not taken in last 30 days  . ondansetron (ZOFRAN ODT) 4 MG disintegrating tablet Patient has not taken in last 30 days  . ONE TOUCH LANCETS MISC Patient has not taken in last 30 days  . dicyclomine (BENTYL) 20 MG tablet Reorder    Follow-up: Return in about 4 weeks (around 12/07/2017).   Crecencio Mc, MD

## 2017-11-09 NOTE — Patient Instructions (Addendum)
Resume dicyclomine at 1/2 tablet right before breakfast and dinner meals.   Resume the Crestor,  I have refilled it   Plain x ray today to evaluate for constipation , if the x ray does not suggest constipation as the source of your pain,  you will need  to have a repeat CT of the abdomen to make sure the diverticulitis is not flaring again

## 2017-11-10 ENCOUNTER — Encounter: Payer: Self-pay | Admitting: Internal Medicine

## 2017-11-10 ENCOUNTER — Other Ambulatory Visit: Payer: Self-pay | Admitting: Internal Medicine

## 2017-11-10 MED ORDER — LACTULOSE 20 GM/30ML PO SOLN: ORAL | 3 refills | Status: DC

## 2017-11-11 NOTE — Assessment & Plan Note (Addendum)
Historically well-controlled on diet alone , but she is months overdue for follow up. Patient is up-to-date on eye exams and overdue for labs.  foot exam has been done today. Patient has no history of microalbuminuria. Patient is tolerating statin therapy for CAD risk reduction and on ACE/ARB for renal protection and hypertension .    Lab Results  Component Value Date   HGBA1C 6.7 (H) 02/09/2017   Lab Results  Component Value Date   MICROALBUR <0.7 08/22/2016   Lab Results  Component Value Date   CREATININE 1.30 (H) 08/15/2017

## 2017-11-11 NOTE — Assessment & Plan Note (Addendum)
Stable.  She is avoiding NSAIDs..  She is on an ARB for control of hypertension,, and will resume statin for control of hyperlipidemia.  Lab Results  Component Value Date   CREATININE 1.30 (H) 08/15/2017   Lab Results  Component Value Date   NA 137 08/15/2017   K 4.2 08/15/2017   CL 102 08/15/2017   CO2 23 08/15/2017   Lab Results  Component Value Date   MICROALBUR <0.7 08/22/2016

## 2017-11-11 NOTE — Assessment & Plan Note (Signed)
With known CAD. .I see no evidence  In the chart that supports suspension of Crestor,  So I have advised her to resume the medication.

## 2017-11-12 ENCOUNTER — Telehealth: Payer: Self-pay | Admitting: Internal Medicine

## 2017-11-12 ENCOUNTER — Ambulatory Visit: Payer: Medicare Other | Admitting: Internal Medicine

## 2017-11-12 NOTE — Telephone Encounter (Signed)
Spoke with pt and informed her of her xray results and medications that Dr. Derrel Nip would like for the pt to start taking. The pr gave a verbal understanding.

## 2017-11-12 NOTE — Telephone Encounter (Signed)
Please advise 

## 2017-11-12 NOTE — Telephone Encounter (Signed)
Copied from [id number] [phone]. Topic: Quick Communication - See Telephone Encounter >> Nov 12, 2017 11:26 AM Aurelio Brash B wrote: CRM for notification. See Telephone encounter for:  Pt called for results of xray of colon 11/12/17.

## 2017-11-13 ENCOUNTER — Other Ambulatory Visit (INDEPENDENT_AMBULATORY_CARE_PROVIDER_SITE_OTHER): Payer: Medicare Other

## 2017-11-13 DIAGNOSIS — E7800 Pure hypercholesterolemia, unspecified: Secondary | ICD-10-CM | POA: Diagnosis not present

## 2017-11-13 DIAGNOSIS — N182 Chronic kidney disease, stage 2 (mild): Secondary | ICD-10-CM | POA: Diagnosis not present

## 2017-11-13 DIAGNOSIS — K5732 Diverticulitis of large intestine without perforation or abscess without bleeding: Secondary | ICD-10-CM | POA: Diagnosis not present

## 2017-11-13 DIAGNOSIS — E1122 Type 2 diabetes mellitus with diabetic chronic kidney disease: Secondary | ICD-10-CM

## 2017-11-13 LAB — COMPREHENSIVE METABOLIC PANEL
ALBUMIN: 4.3 g/dL (ref 3.5–5.2)
ALT: 14 U/L (ref 0–35)
AST: 23 U/L (ref 0–37)
Alkaline Phosphatase: 69 U/L (ref 39–117)
BUN: 24 mg/dL — ABNORMAL HIGH (ref 6–23)
CALCIUM: 9.3 mg/dL (ref 8.4–10.5)
CHLORIDE: 106 meq/L (ref 96–112)
CO2: 28 meq/L (ref 19–32)
Creatinine, Ser: 1.15 mg/dL (ref 0.40–1.20)
GFR: 48.83 mL/min — ABNORMAL LOW (ref >60.00)
Glucose, Bld: 137 mg/dL — ABNORMAL HIGH (ref 70–99)
Potassium: 4.5 mEq/L (ref 3.5–5.1)
Sodium: 141 mEq/L (ref 135–145)
Total Bilirubin: 0.4 mg/dL (ref 0.2–1.2)
Total Protein: 6.9 g/dL (ref 6.0–8.3)

## 2017-11-13 LAB — LIPID PANEL
CHOL/HDL RATIO: 6
CHOLESTEROL: 212 mg/dL — AB (ref 0–200)
HDL: 32.8 mg/dL — ABNORMAL LOW (ref >39.00)
NonHDL: 179.21
TRIGLYCERIDES: 245 mg/dL — AB (ref 0.0–149.0)
VLDL: 49 mg/dL — AB (ref 0.0–40.0)

## 2017-11-13 LAB — CBC WITH DIFFERENTIAL/PLATELET
Basophils Absolute: 0 10*3/uL (ref 0.0–0.1)
Basophils Relative: 0.5 % (ref 0.0–3.0)
EOS ABS: 0.1 10*3/uL (ref 0.0–0.7)
EOS PCT: 3.8 % (ref 0.0–5.0)
HCT: 35 % — ABNORMAL LOW (ref 36.0–46.0)
Hemoglobin: 11.6 g/dL — ABNORMAL LOW (ref 12.0–15.0)
Lymphocytes Relative: 30.1 % (ref 12.0–46.0)
Lymphs Abs: 1 10*3/uL (ref 0.7–4.0)
MCHC: 33 g/dL (ref 30.0–36.0)
MCV: 87.6 fl (ref 78.0–100.0)
MONOS PCT: 10.1 % (ref 3.0–12.0)
Monocytes Absolute: 0.3 10*3/uL (ref 0.1–1.0)
NEUTROS ABS: 1.9 10*3/uL (ref 1.4–7.7)
Neutrophils Relative %: 55.5 % (ref 43.0–77.0)
PLATELETS: 227 10*3/uL (ref 150.0–400.0)
RBC: 3.99 Mil/uL (ref 3.87–5.11)
RDW: 13.7 % (ref 11.5–15.5)
WBC: 3.5 10*3/uL — ABNORMAL LOW (ref 4.0–10.5)

## 2017-11-13 LAB — HEMOGLOBIN A1C: HEMOGLOBIN A1C: 6.4 % (ref 4.6–6.5)

## 2017-11-13 LAB — MICROALBUMIN / CREATININE URINE RATIO
Creatinine,U: 102.8 mg/dL
MICROALB/CREAT RATIO: 0.8 mg/g (ref 0.0–30.0)
Microalb, Ur: 0.8 mg/dL (ref 0.0–1.9)

## 2017-11-13 LAB — LDL CHOLESTEROL, DIRECT: Direct LDL: 137 mg/dL

## 2017-11-13 LAB — SEDIMENTATION RATE: SED RATE: 23 mm/h (ref 0–30)

## 2017-11-15 ENCOUNTER — Encounter: Payer: Self-pay | Admitting: Internal Medicine

## 2017-12-06 DIAGNOSIS — I6523 Occlusion and stenosis of bilateral carotid arteries: Secondary | ICD-10-CM | POA: Diagnosis not present

## 2017-12-19 DIAGNOSIS — I129 Hypertensive chronic kidney disease with stage 1 through stage 4 chronic kidney disease, or unspecified chronic kidney disease: Secondary | ICD-10-CM | POA: Diagnosis not present

## 2017-12-19 DIAGNOSIS — N183 Chronic kidney disease, stage 3 (moderate): Secondary | ICD-10-CM | POA: Diagnosis not present

## 2017-12-19 DIAGNOSIS — N390 Urinary tract infection, site not specified: Secondary | ICD-10-CM | POA: Diagnosis not present

## 2017-12-21 ENCOUNTER — Ambulatory Visit (INDEPENDENT_AMBULATORY_CARE_PROVIDER_SITE_OTHER): Payer: Medicare Other | Admitting: Internal Medicine

## 2017-12-21 ENCOUNTER — Encounter: Payer: Self-pay | Admitting: Internal Medicine

## 2017-12-21 VITALS — BP 138/72 | HR 71 | Temp 98.4°F | Resp 15 | Ht 63.25 in | Wt 155.8 lb

## 2017-12-21 DIAGNOSIS — B372 Candidiasis of skin and nail: Secondary | ICD-10-CM

## 2017-12-21 DIAGNOSIS — K581 Irritable bowel syndrome with constipation: Secondary | ICD-10-CM | POA: Diagnosis not present

## 2017-12-21 DIAGNOSIS — R3982 Chronic bladder pain: Secondary | ICD-10-CM

## 2017-12-21 LAB — URINALYSIS, ROUTINE W REFLEX MICROSCOPIC
BILIRUBIN URINE: NEGATIVE
HGB URINE DIPSTICK: NEGATIVE
KETONES UR: NEGATIVE
LEUKOCYTES UA: NEGATIVE
Nitrite: NEGATIVE
PH: 5 (ref 5.0–8.0)
Specific Gravity, Urine: 1.025 (ref 1.000–1.030)
TOTAL PROTEIN, URINE-UPE24: NEGATIVE
Urine Glucose: NEGATIVE
Urobilinogen, UA: 0.2 (ref 0.0–1.0)

## 2017-12-21 MED ORDER — NYSTATIN 100000 UNIT/GM EX CREA: 1.0000 "application " | TOPICAL_CREAM | Freq: Two times a day (BID) | CUTANEOUS | 0 refills | Status: DC

## 2017-12-21 NOTE — Patient Instructions (Signed)
Nystatin cream apply twice daily  to  Belly crease with a clean q tip  After inflammation resolves,  Stop using and use gold bond medicated powder instead   Referral to Dr Ernestine Conrad for your chronic bladder pain

## 2017-12-21 NOTE — Progress Notes (Signed)
Subjective:  Patient ID: Sabrina Montoya, female    DOB: 1941-07-19  Age: 77 y.o. MRN: [id number] ,  Sharma Covert dnot help.  startd Activi CC: The primary encounter diagnosis was Chronic bladder pain. Diagnoses of Candidiasis of skin and Irritable bowel syndrome with constipation were also pertinent to this visit.  HPI Sabrina Montoya presents for  Follow up on persistent reports of abdominal pain and small caliber stools .  Seen one month ago for same.   Plain films suggested constipation and inflammatory markers were normal.   She was treated with lactulose, no great effect,  But notes  improved bowel function with daily use of yogurt. Which has  improved the consistency and she is now  having 2 to 3 formed stools today   Cc: her abdominal pain is actually suprapubic pain  Which she describes as constant and "raw" feeling.  States that at times she feels that her bladder is dropping and she feels pressure in her vagina .  History of prior bladder suspension surgery,  Saw Cheryl Flash in July 2018  for follow up after bladder surgery  And was offended by Dr Buel Ream response to her complaints as being "secondary to aging."  Office visit report is available on line , which I reviewed with patient today.  The note reflects that patient brought up multiple complaints  Unrelated  to her bladder condition, including a concern that her bladder surgery made her hip circumference larger , and a rash in the crease of her abdominal skin n.   She reports that her bladder has hurt for nearly a year .  She is concerned that her urine continues to have a strong odor,  Despite increasing her water intake .  She denies dysuria and hematuria.    Lab Results  Component Value Date   HGBA1C 6.4 11/13/2017        Lab Results  Component Value Date   ESRSEDRATE 23 11/13/2017   Lab Results  Component Value Date   CRP 0.7 12/27/2012       concerned about :ow grade fevers"  Discussed.   Outpatient Medications Prior to  Visit  Medication Sig Dispense Refill  . aspirin 81 MG tablet Take 81 mg by mouth daily.    Marland Kitchen buPROPion (WELLBUTRIN XL) 150 MG 24 hr tablet TAKE 1 TABLET BY MOUTH ONCE EVERY MORNING 30 tablet 2  . cholecalciferol (VITAMIN D) 1000 UNITS tablet Take 1,000 Units by mouth daily.    . clonazePAM (KLONOPIN) 0.5 MG tablet TAKE 1 TABLET BY MOUTH AT BEDTIME 30 tablet 0  . dicyclomine (BENTYL) 20 MG tablet Take 1 tablet (20 mg total) by mouth 2 (two) times daily. 60 tablet 3  . isosorbide mononitrate (IMDUR) 30 MG 24 hr tablet Take 30 mg by mouth daily.    . Lactulose 20 GM/30ML SOLN 30 ml every 4 hours until constipation is relieved 236 mL 3  . metoprolol tartrate (LOPRESSOR) 25 MG tablet Take 0.5 tablets (12.5 mg total) by mouth 2 (two) times daily. 30 tablet 3  . Multiple Vitamins-Minerals (PRESERVISION AREDS 2 PO) Take by mouth 2 (two) times daily.    Marland Kitchen omeprazole (PRILOSEC) 20 MG capsule Take 1 capsule (20 mg total) by mouth daily. 30 capsule 2   No facility-administered medications prior to visit.     Review of Systems;  Patient denies headache, fevers, malaise, unintentional weight loss, skin rash, eye pain, sinus congestion and sinus pain, sore throat, dysphagia,  hemoptysis ,  cough, dyspnea, wheezing, chest pain, palpitations, orthopnea, edema, abdominal pain, nausea, melena, diarrhea, constipation, flank pain, dysuria, hematuria, urinary  Frequency, nocturia, numbness, tingling, seizures,  Focal weakness, Loss of consciousness,  Tremor, insomnia, depression, anxiety, and suicidal ideation.      Objective:  BP 138/72 (BP Location: Left Arm, Patient Position: Sitting, Cuff Size: Normal)   Pulse 71   Temp 98.4 F (36.9 C) (Oral)   Resp 15   Ht 5' 3.25" (1.607 m)   Wt 155 lb 12.8 oz (70.7 kg)   SpO2 97%   BMI 27.38 kg/m   BP Readings from Last 3 Encounters:  12/21/17 138/72  11/09/17 112/74  08/15/17 (!) 128/59    Wt Readings from Last 3 Encounters:  12/21/17 155 lb 12.8 oz (70.7  kg)  11/09/17 153 lb 9.6 oz (69.7 kg)  08/15/17 153 lb (69.4 kg)    General appearance: alert, cooperative and appears stated age Ears: normal TM's and external ear canals both ears Throat: lips, mucosa, and tongue normal; teeth and gums normal Neck: no adenopathy, no carotid bruit, supple, symmetrical, trachea midline and thyroid not enlarged, symmetric, no tenderness/mass/nodules Back: symmetric, no curvature. ROM normal. No CVA tenderness. Lungs: clear to auscultation bilaterally Heart: regular rate and rhythm, S1, S2 normal, no murmur, click, rub or gallop Abdomen: soft, non-tender; bowel sounds normal; no masses,  no organomegaly Pulses: 2+ and symmetric Skin: Skin color, texture, turgor normal. No rashes or lesions Lymph nodes: Cervical, supraclavicular, and axillary nodes normal.  Lab Results  Component Value Date   HGBA1C 6.4 11/13/2017   HGBA1C 6.7 (H) 02/09/2017   HGBA1C 6.2 02/09/2017    Lab Results  Component Value Date   CREATININE 1.15 11/13/2017   CREATININE 1.30 (H) 08/15/2017   CREATININE 1.10 (H) 08/02/2017    Lab Results  Component Value Date   WBC 3.5 (L) 11/13/2017   HGB 11.6 (L) 11/13/2017   HCT 35.0 (L) 11/13/2017   PLT 227.0 11/13/2017   GLUCOSE 137 (H) 11/13/2017   CHOL 212 (H) 11/13/2017   TRIG 245.0 (H) 11/13/2017   HDL 32.80 (L) 11/13/2017   LDLDIRECT 137.0 11/13/2017   LDLCALC 53 02/09/2017   ALT 14 11/13/2017   AST 23 11/13/2017   NA 141 11/13/2017   K 4.5 11/13/2017   CL 106 11/13/2017   CREATININE 1.15 11/13/2017   BUN 24 (H) 11/13/2017   CO2 28 11/13/2017   TSH 1.79 03/07/2016   HGBA1C 6.4 11/13/2017   MICROALBUR 0.8 11/13/2017    Ct Renal Stone Study  Result Date: 08/15/2017 CLINICAL DATA:  Unspecified abdominal pain. Suspected diverticulitis. EXAM: CT ABDOMEN AND PELVIS WITHOUT CONTRAST TECHNIQUE: Multidetector CT imaging of the abdomen and pelvis was performed following the standard protocol without IV contrast.  COMPARISON:  08/02/2017 FINDINGS: Lower chest:  No acute finding.  Mitral annular calcification. Hepatobiliary: No focal liver abnormality.Cholecystectomy. Normal common bile duct diameter. Pancreas: Unremarkable. Spleen: Unremarkable. Adrenals/Urinary Tract: Negative adrenals. Elongated parenchymal calcification in the right anterior hilar lip measuring up to 7 mm in length. Bilateral renal cystic densities. Tiny high-density areas superficially within the bilateral renal cortex which are likely proteinaceous cysts. Status post contrast-enhanced CT 08/02/2017. No hydronephrosis. Unremarkable bladder. Stomach/Bowel: Extensive colonic diverticulosis. Inflammation at the descending sigmoid junction seen previously is resolved. Appendectomy.  Negative for bowel obstruction Vascular/Lymphatic: Extensive atherosclerotic calcification of the aorta and its branches. There is fusiform aneurysmal enlargement of the infrarenal aorta measuring up to 3.8 cm diameter. No mass or adenopathy. Reproductive:Hysterectomy.  Negative  adnexae. Other: No ascites or pneumoperitoneum. Musculoskeletal: No acute or aggressive finding. Diffuse spinal degeneration with postoperative changes to the lumbar posterior elements. IMPRESSION: 1. No acute finding. Resolved inflammation at the descending sigmoid junction. 2. Extensive distal colonic diverticulosis. 3.  Aortic Atherosclerosis (ICD10-I70.0). 4. Aortic aneurysm NOS (ICD10-I71.9). Maximal infrarenal diameter is 3.8 cm. Recommend followup by ultrasound in 2 years. This recommendation follows ACR consensus guidelines: White Paper of the ACR Incidental Findings Committee II on Vascular Findings. J Am Coll Radiol 2013; 10:789-794. Electronically Signed   By: Monte Fantasia M.D.   On: 08/15/2017 12:34    Assessment & Plan:   Problem List Items Addressed This Visit    Chronic bladder pain - Primary    Referral to new urology provider for evaluation given patient's dissatisfaction with  last urology follow up with dr Sharlett Iles.  UA is normal ,  But interstitial cystitis may be contrbuting to pain       Relevant Orders   Urinalysis, Routine w reflex microscopic (Completed)   Urine Culture (Completed)   Ambulatory referral to Urology   Candidiasis of skin    She has erythema and maceration of the skin in the crease of her abdominal skin fold.  Nystatin bid trial.       Relevant Medications   nystatin cream (MYCOSTATIN)   Irritable bowel syndrome (IBS)    Improved bowel habits with daily use of yogurt  Last visit one month ago for same,  Suggested constipation,  Not recurrent diverticulitis ,  As source of symptoms (normal inflammatory markers,  Increased stool burden on plain films)        A total of 25 minutes of face to face time was spent with patient more than half of which was spent in counselling about the above mentioned conditions  and coordination of care  I am having Velva H. Galdamez start on nystatin cream. I am also having her maintain her cholecalciferol, aspirin, isosorbide mononitrate, Multiple Vitamins-Minerals (PRESERVISION AREDS 2 PO), omeprazole, buPROPion, metoprolol tartrate, clonazePAM, dicyclomine, and Lactulose.  Meds ordered this encounter  Medications  . nystatin cream (MYCOSTATIN)    Sig: Apply 1 application topically 2 (two) times daily.    Dispense:  30 g    Refill:  0    There are no discontinued medications.  Follow-up: No Follow-up on file.   Crecencio Mc, MD

## 2017-12-22 LAB — URINE CULTURE
MICRO NUMBER:: 90205340
SPECIMEN QUALITY: ADEQUATE

## 2017-12-23 ENCOUNTER — Encounter: Payer: Self-pay | Admitting: Internal Medicine

## 2017-12-23 DIAGNOSIS — R3982 Chronic bladder pain: Secondary | ICD-10-CM | POA: Insufficient documentation

## 2017-12-23 DIAGNOSIS — K589 Irritable bowel syndrome without diarrhea: Secondary | ICD-10-CM | POA: Insufficient documentation

## 2017-12-23 DIAGNOSIS — B372 Candidiasis of skin and nail: Secondary | ICD-10-CM | POA: Insufficient documentation

## 2017-12-23 NOTE — Assessment & Plan Note (Signed)
Discussed getting a second opinion on the recurrence of her cystocele and rule out of interstitial cystitis from Dr MC=gcGowan .   Referral in progress.  UA today was normal

## 2017-12-23 NOTE — Assessment & Plan Note (Signed)
Referral to new urology provider for evaluation given patient's dissatisfaction with last urology follow up with dr Sharlett Iles.  UA is normal ,  But interstitial cystitis may be contrbuting to pain

## 2017-12-23 NOTE — Assessment & Plan Note (Signed)
Improved bowel habits with daily use of yogurt  Last visit one month ago for same,  Suggested constipation,  Not recurrent diverticulitis ,  As source of symptoms (normal inflammatory markers,  Increased stool burden on plain films)

## 2017-12-23 NOTE — Assessment & Plan Note (Signed)
She has erythema and maceration of the skin in the crease of her abdominal skin fold.  Nystatin bid trial.

## 2017-12-24 DIAGNOSIS — R1032 Left lower quadrant pain: Secondary | ICD-10-CM | POA: Diagnosis not present

## 2018-01-03 ENCOUNTER — Ambulatory Visit (INDEPENDENT_AMBULATORY_CARE_PROVIDER_SITE_OTHER): Payer: Medicare Other | Admitting: Urology

## 2018-01-03 ENCOUNTER — Encounter: Payer: Self-pay | Admitting: Urology

## 2018-01-03 VITALS — BP 144/70 | HR 68 | Ht 63.25 in | Wt 155.5 lb

## 2018-01-03 DIAGNOSIS — G8929 Other chronic pain: Secondary | ICD-10-CM | POA: Diagnosis not present

## 2018-01-03 DIAGNOSIS — R3982 Chronic bladder pain: Secondary | ICD-10-CM | POA: Diagnosis not present

## 2018-01-03 DIAGNOSIS — R102 Pelvic and perineal pain: Secondary | ICD-10-CM | POA: Diagnosis not present

## 2018-01-03 LAB — MICROSCOPIC EXAMINATION
RBC, UA: NONE SEEN /hpf (ref 0–?)
WBC UA: NONE SEEN /HPF (ref 0–?)

## 2018-01-03 LAB — URINALYSIS, COMPLETE
Bilirubin, UA: NEGATIVE
GLUCOSE, UA: NEGATIVE
KETONES UA: NEGATIVE
NITRITE UA: NEGATIVE
PROTEIN UA: NEGATIVE
RBC, UA: NEGATIVE
SPEC GRAV UA: 1.02 (ref 1.005–1.030)
UUROB: 0.2 mg/dL (ref 0.2–1.0)
pH, UA: 5 (ref 5.0–7.5)

## 2018-01-03 NOTE — Progress Notes (Signed)
01/03/2018 11:32 AM   Sabrina Montoya 1940/12/19 [id number]  Referring provider: Crecencio Mc, MD [place], [place] [address]  Chief Complaint  Patient presents with  . New Patient (Initial Visit)    Bladder pain    HPI: The patient is a 76 year old female with a past medical history of prior bladder suspension likely for a cystocele and IBS presents today to discuss chronic suprapubic discomfort.  She also has dysuria with negative urine cultures and a negative urinalysis today.  This is been going on for approximately 6 months to a year.  She has the urge to urinate and as she starts to urinate she has burning with urination.  Is on SGOT a 4 out of 10 however she finds is very bothersome.  She does note urinary frequency.  Once her urinary stream is initiated she is unable to stop it.  She does have a history of a hysterectomy and cystocele repair in 2016.  She notes nothing that makes this worse nothing that makes this better.  She does not related to any activities.  She does have no history of recurrent UTI.  She feels her urine smells different however her urinalysis today is unremarkable.  She does have IBS and has significant issues with chronic constipation.  She did have a CT scan in October 2018 which was unremarkable.  PMH: Past Medical History:  Diagnosis Date  . Abdominal aortic aneurysm without mention of rupture    infrarenal, stable, folllowed by Leotis Pain  . Acute posthemorrhagic anemia   . Arthritis   . B12 deficiency   . CAD (coronary artery disease), autologous vein bypass graft   . Cardiac dysrhythmia, unspecified   . Colon polyp   . Depression   . Diverticulitis of colon   . Dizziness    DUE TO MEDICINES  . Dysrhythmia   . GERD (gastroesophageal reflux disease)   . HOH (hard of hearing)   . Hyperlipidemia   . Hypertension    pt denies. placed on meds after CABG  . IBS (irritable bowel syndrome)   . Neuropathy   . Peripheral  vascular disease ([place])    s/p CEA   . Reflux esophagitis   . Rheumatic fever    possible at age 40  . Sliding hiatal hernia   . Tobacco abuse   . Tobacco abuse     Surgical History: Past Surgical History:  Procedure Laterality Date  . ABDOMINAL SURGERY  1976   for pain secondary to scar tissue, s/p apply  . APPENDECTOMY  1974  . BACK SURGERY    . CAROTID ENDARTERECTOMY    . CATARACT EXTRACTION W/PHACO Right 10/18/2016   Procedure: CATARACT EXTRACTION PHACO AND INTRAOCULAR LENS PLACEMENT (IOC);  Surgeon: Estill Cotta, MD;  Location: [facility] ORS;  Service: Ophthalmology;  Laterality: Right;  Lot # [id number] H [place]: 01:04.0 AP%: 24.4 CDE: 30.44  . CATARACT EXTRACTION W/PHACO Left 03/14/2017   Procedure: CATARACT EXTRACTION PHACO AND INTRAOCULAR LENS PLACEMENT ([place])  Left;  Surgeon: Leandrew Koyanagi, MD;  Location: [facility];  Service: Ophthalmology;  Laterality: Left;  . CEA     Carotid stenosis found during workup for dysphagia,  [facility]  . 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 CHOLECYSTECTOMY  2002   Dr. Pat Patrick  . CLOSED MANIPULATION SHOULDER     left shoulder post physical therapy, Right shoulder redo (toggle bolts)  . COLONOSCOPY  09/01/14  . coronary angiography  01/2011   one occluded artery with collateralization, other arteries patent  . CORONARY ARTERY BYPASS GRAFT  2006   s/p triple bypass surgery, [facility]  . heart catherization  2016  . HERNIA REPAIR  may 2011   Dr. Pat Patrick  . hernia repair    . LUMBAR DISC SURGERY  1971   L5, unssuccessful, fusion in 1983 successful (2 lumbar)  . ROTATOR CUFF REPAIR  2002   right shoulder Dr. Nadean Corwin, [place] in [place] Medications:  Allergies as of 01/03/2018      Reactions   Niacin And Related Hives   Sertraline Hcl Other (See Comments)   Hallucinations,     Sulfa Drugs Cross Reactors Nausea Only   Tetracyclines &  Related Hives   Latex Rash   RAST testing was NEGATIVE  for LATEX   Simvastatin Rash   *Antihyperlipidemics*; elevated LFT's.       Medication List        Accurate as of 01/03/18 11:32 AM. Always use your most recent med list.          aspirin 81 MG tablet Take 81 mg by mouth daily.   buPROPion 150 MG 24 hr tablet Commonly known as:  WELLBUTRIN XL TAKE 1 TABLET BY MOUTH ONCE EVERY MORNING   cholecalciferol 1000 units tablet Commonly known as:  VITAMIN D Take 1,000 Units by mouth daily.   clonazePAM 0.5 MG tablet Commonly known as:  KLONOPIN TAKE 1 TABLET BY MOUTH AT BEDTIME   dicyclomine 20 MG tablet Commonly known as:  BENTYL Take 1 tablet (20 mg total) by mouth 2 (two) times daily.   isosorbide mononitrate 30 MG 24 hr tablet Commonly known as:  IMDUR Take 30 mg by mouth daily.   Lactulose 20 GM/30ML Soln 30 ml every 4 hours until constipation is relieved   metoprolol tartrate 25 MG tablet Commonly known as:  LOPRESSOR Take 0.5 tablets (12.5 mg total) by mouth 2 (two) times daily.   nystatin cream Commonly known as:  MYCOSTATIN Apply 1 application topically 2 (two) times daily.   omeprazole 20 MG capsule Commonly known as:  PRILOSEC Take 1 capsule (20 mg total) by mouth daily.   PRESERVISION AREDS 2 PO Take by mouth 2 (two) times daily.       Allergies:  Allergies  Allergen Reactions  . Niacin And Related Hives  . Sertraline Hcl Other (See Comments)    Hallucinations,    . Sulfa Drugs Cross Reactors Nausea Only  . Tetracyclines & Related Hives  . Latex Rash    RAST testing was NEGATIVE  for LATEX  . Simvastatin Rash    *Antihyperlipidemics*; elevated LFT's.     Family History: Family History  Problem Relation Age of Onset  . Cancer Sister        cervical cancer  . Diabetes Sister   . Heart disease Brother        coronary artery disease  . Other Brother        suicide  . Diabetes Mother   . Heart disease Mother   . Diabetes Sister     . Other Father        suicide  . Cancer Sister        breast  . Other Other        colon resection due to inflammation -nephew  . Bladder Cancer Neg Hx   .  Kidney cancer Neg Hx     Social History:  reports that she quit smoking about 5 years ago. Her smoking use included cigarettes. She has a 15.00 pack-year smoking history. she has never used smokeless tobacco. She reports that she does not drink alcohol or use drugs.  ROS: UROLOGY Frequent Urination?: Yes Hard to postpone urination?: Yes Burning/pain with urination?: Yes Get up at night to urinate?: Yes Leakage of urine?: No Urine stream starts and stops?: Yes Trouble starting stream?: Yes Do you have to strain to urinate?: Yes Blood in urine?: No Urinary tract infection?: No Sexually transmitted disease?: No Injury to kidneys or bladder?: No Painful intercourse?: No Weak stream?: No Currently pregnant?: No Vaginal bleeding?: No Last menstrual period?: N  Gastrointestinal Nausea?: No Vomiting?: No Indigestion/heartburn?: Yes Diarrhea?: No Constipation?: No  Constitutional Fever: No Night sweats?: No Weight loss?: No Fatigue?: Yes  Skin Skin rash/lesions?: No Itching?: No  Eyes Blurred vision?: No Double vision?: Yes  Ears/Nose/Throat Sore throat?: Yes Sinus problems?: No  Hematologic/Lymphatic Swollen glands?: Yes Easy bruising?: Yes  Cardiovascular Leg swelling?: No Chest pain?: Yes  Respiratory Cough?: No Shortness of breath?: Yes  Endocrine Excessive thirst?: No  Musculoskeletal Back pain?: Yes Joint pain?: Yes  Neurological Headaches?: No Dizziness?: Yes  Psychologic Depression?: Yes Anxiety?: Yes  Physical Exam: BP (!) 144/70 (BP Location: Right Arm, Patient Position: Sitting, Cuff Size: Normal)   Pulse 68   Ht 5' 3.25" (1.607 m)   Wt 155 lb 8 oz (70.5 kg)   BMI 27.33 kg/m   Constitutional:  Alert and oriented, No acute distress. HEENT: [place] AT, moist mucus membranes.   Trachea midline, no masses. Cardiovascular: No clubbing, cyanosis, or edema. Respiratory: Normal respiratory effort, no increased work of breathing. GI: Abdomen is soft, nontender, nondistended, no abdominal masses GU: No CVA tenderness.  Skin: No rashes, bruises or suspicious lesions. Lymph: No cervical or inguinal adenopathy. Neurologic: Grossly intact, no focal deficits, moving all 4 extremities. Psychiatric: Normal mood and affect.  Laboratory Data: Lab Results  Component Value Date   WBC 3.5 (L) 11/13/2017   HGB 11.6 (L) 11/13/2017   HCT 35.0 (L) 11/13/2017   MCV 87.6 11/13/2017   PLT 227.0 11/13/2017    Lab Results  Component Value Date   CREATININE 1.15 11/13/2017    No results found for: PSA  No results found for: TESTOSTERONE  Lab Results  Component Value Date   HGBA1C 6.4 11/13/2017    Urinalysis    Component Value Date/Time   COLORURINE YELLOW 12/21/2017 1140   APPEARANCEUR CLEAR 12/21/2017 1140   APPEARANCEUR Hazy 04/30/2012 1937   LABSPEC 1.025 12/21/2017 1140   LABSPEC 1.020 04/30/2012 1937   PHURINE 5.0 12/21/2017 1140   GLUCOSEU NEGATIVE 12/21/2017 1140   HGBUR NEGATIVE 12/21/2017 [place] 12/21/2017 1140   BILIRUBINUR negative 02/09/2017 1004   BILIRUBINUR Negative 04/30/2012 1937   KETONESUR NEGATIVE 12/21/2017 1140   PROTEINUR NEGATIVE 08/15/2017 1013   UROBILINOGEN 0.2 12/21/2017 1140   NITRITE NEGATIVE 12/21/2017 1140   LEUKOCYTESUR NEGATIVE 12/21/2017 1140   LEUKOCYTESUR Trace 04/30/2012 1937      Assessment & Plan:    1.  Chronic pelvic pain Discussed the patient that chronic pelvic pain is very difficult to treat and is usually best served by physical therapy.  Prior to instituting that, though I have recommended she undergo a pelvic exam as well as cystoscopy.  We will plan to do these at the same time in the near  future.  If this is normal, we can consider physical therapy at that time.  Return for cysto/pelvic  exam.  Nickie Retort, MD  [facility] [address] [place], [place] [address] [phone]

## 2018-01-04 ENCOUNTER — Other Ambulatory Visit: Payer: Self-pay | Admitting: Family Medicine

## 2018-01-04 ENCOUNTER — Other Ambulatory Visit: Payer: Self-pay | Admitting: Internal Medicine

## 2018-01-04 DIAGNOSIS — I25119 Atherosclerotic heart disease of native coronary artery with unspecified angina pectoris: Secondary | ICD-10-CM | POA: Diagnosis not present

## 2018-01-04 DIAGNOSIS — I6523 Occlusion and stenosis of bilateral carotid arteries: Secondary | ICD-10-CM | POA: Diagnosis not present

## 2018-01-04 DIAGNOSIS — I714 Abdominal aortic aneurysm, without rupture: Secondary | ICD-10-CM | POA: Diagnosis not present

## 2018-01-04 DIAGNOSIS — I10 Essential (primary) hypertension: Secondary | ICD-10-CM | POA: Diagnosis not present

## 2018-01-04 NOTE — Telephone Encounter (Signed)
Last OV 12/21/17 last filled 10/29/17 30 0rf

## 2018-01-05 NOTE — Telephone Encounter (Signed)
FYI.  Called pt.  She picked up refill of clonazepam today.  Does not need at this time.

## 2018-01-06 ENCOUNTER — Other Ambulatory Visit: Payer: Self-pay | Admitting: Internal Medicine

## 2018-01-18 ENCOUNTER — Other Ambulatory Visit: Payer: Medicare Other | Admitting: Urology

## 2018-01-18 ENCOUNTER — Telehealth: Payer: Self-pay | Admitting: Urology

## 2018-01-18 NOTE — Telephone Encounter (Signed)
FYI patient cx her cysto   Sabrina Montoya

## 2018-01-22 DIAGNOSIS — R1032 Left lower quadrant pain: Secondary | ICD-10-CM | POA: Diagnosis not present

## 2018-02-01 ENCOUNTER — Other Ambulatory Visit: Payer: Self-pay | Admitting: Internal Medicine

## 2018-02-11 ENCOUNTER — Ambulatory Visit: Payer: Medicare Other

## 2018-02-12 ENCOUNTER — Telehealth: Payer: Self-pay | Admitting: Internal Medicine

## 2018-02-12 ENCOUNTER — Ambulatory Visit (INDEPENDENT_AMBULATORY_CARE_PROVIDER_SITE_OTHER): Payer: Medicare Other

## 2018-02-12 VITALS — BP 124/70 | HR 63 | Temp 98.7°F | Resp 14 | Ht 65.0 in | Wt 156.8 lb

## 2018-02-12 DIAGNOSIS — N183 Chronic kidney disease, stage 3 (moderate): Principal | ICD-10-CM

## 2018-02-12 DIAGNOSIS — E1122 Type 2 diabetes mellitus with diabetic chronic kidney disease: Secondary | ICD-10-CM

## 2018-02-12 DIAGNOSIS — Z0000 Encounter for general adult medical examination without abnormal findings: Secondary | ICD-10-CM

## 2018-02-12 DIAGNOSIS — D649 Anemia, unspecified: Secondary | ICD-10-CM

## 2018-02-12 DIAGNOSIS — I129 Hypertensive chronic kidney disease with stage 1 through stage 4 chronic kidney disease, or unspecified chronic kidney disease: Principal | ICD-10-CM

## 2018-02-12 NOTE — Telephone Encounter (Signed)
Pt would like to get her iron checked. Please advise?  Call pt @ [phone]. Thank you!

## 2018-02-12 NOTE — Addendum Note (Signed)
Addended by: Crecencio Mc on: 02/12/2018 08:22 PM   Modules accepted: Orders

## 2018-02-12 NOTE — Progress Notes (Signed)
Subjective:   Sabrina Montoya is a 77 y.o. female who presents for Medicare Annual (Subsequent) preventive examination.  Review of Systems:  No ROS.  Medicare Wellness Visit. Additional risk factors are reflected in the social history.  Cardiac Risk Factors include: advanced age (>52men, >29 women);diabetes mellitus     Objective:     Vitals: BP 124/70 (BP Location: Left Arm, Patient Position: Sitting, Cuff Size: Normal)   Pulse 63   Temp 98.7 F (37.1 C) (Oral)   Resp 14   Ht 5\' 5"  (1.651 m)   Wt 156 lb 12.8 oz (71.1 kg)   SpO2 97%   BMI 26.09 kg/m   Body mass index is 26.09 kg/m.  Advanced Directives 02/12/2018 08/15/2017 03/14/2017 03/09/2017 02/08/2017 10/18/2016 09/08/2016  Does Patient Have a Medical Advance Directive? No No No No No No No  Type of Advance Directive - - - - - - -  Copy of Healthcare Power of Attorney in Chart? - - - - - - -  Would patient like information on creating a medical advance directive? No - Patient declined - Yes (MAU/Ambulatory/Procedural Areas - Information given) - Yes (MAU/Ambulatory/Procedural Areas - Information given) No - Patient declined -    Tobacco Social History   Tobacco Use  Smoking Status Former Smoker  . Packs/day: 0.50  . Years: 30.00  . Pack years: 15.00  . Types: Cigarettes  . Last attempt to quit: 04/01/2012  . Years since quitting: 5.8  Smokeless Tobacco Never Used  Tobacco Comment   quit for 2 years after sinus infection and 6 months after heart surgery     Counseling given: Not Answered Comment: quit for 2 years after sinus infection and 6 months after heart surgery   Clinical Intake:  Pre-visit preparation completed: Yes  Pain : No/denies pain     Nutritional Status: BMI 25 -29 Overweight Diabetes: Yes(Followed by PCP)  How often do you need to have someone help you when you read instructions, pamphlets, or other written materials from your doctor or pharmacy?: 1 - Never  Interpreter Needed?: No     Past  Medical History:  Diagnosis Date  . Abdominal aortic aneurysm without mention of rupture    infrarenal, stable, folllowed by Leotis Pain  . Acute posthemorrhagic anemia   . Arthritis   . B12 deficiency   . CAD (coronary artery disease), autologous vein bypass graft   . Cardiac dysrhythmia, unspecified   . Colon polyp   . Depression   . Diverticulitis of colon   . Dizziness    DUE TO MEDICINES  . Dysrhythmia   . GERD (gastroesophageal reflux disease)   . HOH (hard of hearing)   . Hyperlipidemia   . Hypertension    pt denies. placed on meds after CABG  . IBS (irritable bowel syndrome)   . Neuropathy   . Peripheral vascular disease ([place])    s/p CEA   . Reflux esophagitis   . Rheumatic fever    possible at age 57  . Sliding hiatal hernia   . Tobacco abuse   . Tobacco abuse    Past Surgical History:  Procedure Laterality Date  . ABDOMINAL SURGERY  1976   for pain secondary to scar tissue, s/p apply  . APPENDECTOMY  1974  . BACK SURGERY    . CAROTID ENDARTERECTOMY    . CATARACT EXTRACTION W/PHACO Right 10/18/2016   Procedure: CATARACT EXTRACTION PHACO AND INTRAOCULAR LENS PLACEMENT (IOC);  Surgeon: Estill Cotta, MD;  Location: [facility] ORS;  Service: Ophthalmology;  Laterality: Right;  Lot # [id number] H [place]: 01:04.0 AP%: 24.4 CDE: 30.44  . CATARACT EXTRACTION W/PHACO Left 03/14/2017   Procedure: CATARACT EXTRACTION PHACO AND INTRAOCULAR LENS PLACEMENT ([place])  Left;  Surgeon: Leandrew Koyanagi, MD;  Location: [place];  Service: Ophthalmology;  Laterality: Left;  . CEA     Carotid stenosis found during workup for dysphagia,  [facility]  . 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 CHOLECYSTECTOMY  2002   Dr. Pat Patrick  . CLOSED MANIPULATION SHOULDER     left shoulder post physical therapy, Right shoulder redo (toggle bolts)  . COLONOSCOPY  09/01/14  . coronary angiography  01/2011   one occluded artery with  collateralization, other arteries patent  . CORONARY ARTERY BYPASS GRAFT  2006   s/p triple bypass surgery, [facility]  . heart catherization  2016  . HERNIA REPAIR  may 2011   Dr. Pat Patrick  . hernia repair    . LUMBAR DISC SURGERY  1971   L5, unssuccessful, fusion in 1983 successful (2 lumbar)  . ROTATOR CUFF REPAIR  2002   right shoulder Dr. Nadean Corwin, [facility] in [place]  . VASCULAR SURGERY     Family History  Problem Relation Age of Onset  . Cancer Sister        cervical cancer  . Diabetes Sister   . Heart disease Brother        coronary artery disease  . Other Brother        suicide  . Diabetes Mother   . Heart disease Mother   . Diabetes Sister   . Other Father        suicide  . Cancer Sister        breast  . Other Other        colon resection due to inflammation -nephew  . Bladder Cancer Neg Hx   . Kidney cancer Neg Hx    Social History   Socioeconomic History  . Marital status: Widowed    Spouse name: Not on file  . Number of children: Not on file  . Years of education: Not on file  . Highest education level: Not on file  Occupational History  . Not on file  Social Needs  . Financial resource strain: Not hard at all  . Food insecurity:    Worry: Never true    Inability: Never true  . Transportation needs:    Medical: No    Non-medical: No  Tobacco Use  . Smoking status: Former Smoker    Packs/day: 0.50    Years: 30.00    Pack years: 15.00    Types: Cigarettes    Last attempt to quit: 04/01/2012    Years since quitting: 5.8  . Smokeless tobacco: Never Used  . Tobacco comment: quit for 2 years after sinus infection and 6 months after heart surgery  Substance and Sexual Activity  . Alcohol use: No  . Drug use: No  . Sexual activity: Not Currently  Lifestyle  . Physical activity:    Days per week: Not on file    Minutes per session: Not on file  . Stress: Not on file  Relationships  . Social connections:    Talks on phone: Not on file     Gets together: Not on file    Attends religious service: Not on file    Active member of club or organization:  Not on file    Attends meetings of clubs or organizations: Not on file    Relationship status: Not on file  Other Topics Concern  . Not on file  Social History Narrative   Lives with spouse [name]    Outpatient Encounter Medications as of 02/12/2018  Medication Sig  . aspirin 81 MG tablet Take 81 mg by mouth daily.  Marland Kitchen buPROPion (WELLBUTRIN XL) 150 MG 24 hr tablet TAKE 1 TABLET BY MOUTH ONCE EVERY MORNING  . cholecalciferol (VITAMIN D) 1000 UNITS tablet Take 1,000 Units by mouth daily.  . clonazePAM (KLONOPIN) 0.5 MG tablet TAKE 1 TABLET BY MOUTH AT BEDTIME  . dicyclomine (BENTYL) 20 MG tablet Take 1 tablet (20 mg total) by mouth 2 (two) times daily.  . isosorbide mononitrate (IMDUR) 30 MG 24 hr tablet Take 30 mg by mouth daily.  . metoprolol tartrate (LOPRESSOR) 25 MG tablet TAKE 1/2 TABLET BY MOUTH TWICE DAILY  . Multiple Vitamins-Minerals (PRESERVISION AREDS 2 PO) Take by mouth 2 (two) times daily.  Marland Kitchen omeprazole (PRILOSEC) 20 MG capsule Take 1 capsule (20 mg total) by mouth daily.  . [DISCONTINUED] Lactulose 20 GM/30ML SOLN 30 ml every 4 hours until constipation is relieved  . [DISCONTINUED] nystatin cream (MYCOSTATIN) Apply 1 application topically 2 (two) times daily.  . [DISCONTINUED] omeprazole (PRILOSEC) 20 MG capsule TAKE 1 CAPSULE BY MOUTH ONCE DAILY   No facility-administered encounter medications on file as of 02/12/2018.     Activities of Daily Living In your present state of health, do you have any difficulty performing the following activities: 02/12/2018 03/14/2017  Hearing? N N  Vision? N N  Difficulty concentrating or making decisions? N N  Walking or climbing stairs? N N  Dressing or bathing? N N  Doing errands, shopping? N -  Preparing Food and eating ? N -  Using the Toilet? N -  In the past six months, have you accidently leaked urine? N -  Do you have  problems with loss of bowel control? N -  Managing your Medications? N -  Managing your Finances? N -  Housekeeping or managing your Housekeeping? N -  Some recent data might be hidden    Patient Care Team: Crecencio Mc, MD as PCP - General (Internal Medicine) Crecencio Mc, MD (Internal Medicine) Bary Castilla Forest Gleason, MD (General Surgery)    Assessment:   This is a routine wellness examination for Sabrina Montoya.  The goal of the wellness visit is to assist the patient how to close the gaps in care and create a preventative care plan for the patient.   The roster of all physicians providing medical care to patient is listed in the Snapshot section of the chart.  Taking calcium VIT D as appropriate/Osteoporosis risk reviewed.    Safety issues reviewed; Smoke and carbon monoxide detectors in the home. No firearms or firearms locked in a safe within the home. Wears seatbelts when driving or riding with others. No violence in the home.  They do not have excessive sun exposure.  Discussed the need for sun protection: hats, long sleeves and the use of sunscreen if there is significant sun exposure.  Patient is alert, normal appearance, oriented to person/place/and time.  Correctly identified the president of the [place] and recalls of 2/3 words. Performs simple calculations and can read correct time from watch face.  Displays appropriate judgement.  No new identified risk were noted.  No failures at ADL's or IADL's.    BMI- discussed  the importance of a healthy diet, water intake and the benefits of aerobic exercise. Educational material provided.   24 hour diet recall: Regular diet  Dental- UTD  Eye- Visual acuity not assessed per patient preference since they have regular follow up with the ophthalmologist.  Wears corrective lenses.  Sleep patterns- Sleeps 6 hours at night.  Wakes feeling rested.  TDAP vaccine deferred per patient preference.  Follow up with insurance.  Educational  material provided.  Patient Concerns: None at this time. Follow up with PCP as needed.  Exercise Activities and Dietary recommendations Current Exercise Habits: The patient does not participate in regular exercise at present  Goals    . DIET - INCREASE WATER INTAKE     Stay hydrated    . Increase physical activity     Join the gym  Walk for exercise       Fall Risk Fall Risk  02/12/2018 08/02/2017 02/08/2017 06/06/2016 02/02/2016  Falls in the past year? No No No Yes Yes  Number falls in past yr: - - - 1 1  Comment - - - tripped and fell -  Injury with Fall? - - - Yes Yes  Comment - - - sprained ankle and hit ribs but no fracture R sided rib/breast bruising. Xray complete. Stable.  Risk for fall due to : - - - History of fall(s) -  Follow up - - - - Education provided;Falls prevention discussed   Depression Screen PHQ 2/9 Scores 02/12/2018 08/02/2017 02/08/2017 06/06/2016  PHQ - 2 Score 0 0 0 1     Cognitive Function MMSE - Mini Mental State Exam 02/02/2016  Orientation to time 5  Orientation to Place 5  Registration 3  Attention/ Calculation 5  Recall 3  Language- name 2 objects 2  Language- repeat 1  Language- follow 3 step command 3  Language- read & follow direction 1  Write a sentence 1  Copy design 1  Total score 30     6CIT Screen 02/12/2018 02/08/2017  What Year? 0 points 0 points  What month? 0 points 0 points  What time? 0 points 0 points  Count back from 20 0 points 0 points  Months in reverse 0 points 0 points  Repeat phrase 0 points 0 points  Total Score 0 0    Immunization History  Administered Date(s) Administered  . Influenza Split 10/10/2011, 08/26/2012  . Influenza, High Dose Seasonal PF 08/16/2016, 11/09/2017  . Influenza,inj,Quad PF,6+ Mos 07/25/2013, 07/28/2014, 08/13/2015  . Influenza-Unspecified 08/06/2012  . Pneumococcal Conjugate-13 08/26/2014  . Pneumococcal Polysaccharide-23 09/02/2010   Screening Tests Health Maintenance  Topic Date Due    . TETANUS/TDAP  [birth year]27/1960  . FOOT EXAM  02/09/2018  . HEMOGLOBIN A1C  05/13/2018  . INFLUENZA VACCINE  06/06/2018  . OPHTHALMOLOGY EXAM  10/16/2018  . URINE MICROALBUMIN  11/13/2018  . COLONOSCOPY  09/01/2024  . DEXA SCAN  Completed  . PNA vac Low Risk Adult  Completed    Plan:   End of life planning; Advanced aging; Advanced directives discussed.  No HCPOA/Living Will.  Additional information declined at this time.  I have personally reviewed and noted the following in the patient's chart:   . Medical and social history . Use of alcohol, tobacco or illicit drugs  . Current medications and supplements . Functional ability and status . Nutritional status . Physical activity . Advanced directives . List of other physicians . Hospitalizations, surgeries, and ER visits in previous 12 months . Vitals .  Screenings to include cognitive, depression, and falls . Referrals and appointments  In addition, I have reviewed and discussed with patient certain preventive protocols, quality metrics, and best practice recommendations. A written personalized care plan for preventive services as well as general preventive health recommendations were provided to patient.     Varney Biles, LPN  02/07/9674

## 2018-02-12 NOTE — Patient Instructions (Addendum)
  Sabrina Montoya , Thank you for taking time to come for your Medicare Wellness Visit. I appreciate your ongoing commitment to your health goals. Please review the following plan we discussed and let me know if I can assist you in the future.   Follow up as needed.   Have a great day!  These are the goals we discussed: Goals    . DIET - INCREASE WATER INTAKE     Stay hydrated    . Increase physical activity     Join the gym  Walk for exercise       This is a list of the screening recommended for you and due dates:  Health Maintenance  Topic Date Due  . Tetanus Vaccine  05/27/1960  . Complete foot exam   02/09/2018  . Hemoglobin A1C  05/13/2018  . Flu Shot  06/06/2018  . Eye exam for diabetics  10/16/2018  . Urine Protein Check  11/13/2018  . Colon Cancer Screening  09/01/2024  . DEXA scan (bone density measurement)  Completed  . Pneumonia vaccines  Completed

## 2018-02-12 NOTE — Telephone Encounter (Signed)
Ordered, along with a1c no fasting required  Lab Results  Component Value Date   HGBA1C 6.4 11/13/2017   a1c

## 2018-02-12 NOTE — Telephone Encounter (Signed)
Please advise 

## 2018-02-13 ENCOUNTER — Other Ambulatory Visit (INDEPENDENT_AMBULATORY_CARE_PROVIDER_SITE_OTHER): Payer: Medicare Other

## 2018-02-13 DIAGNOSIS — D649 Anemia, unspecified: Secondary | ICD-10-CM

## 2018-02-13 LAB — CBC WITH DIFFERENTIAL/PLATELET
Basophils Absolute: 0 10*3/uL (ref 0.0–0.1)
Basophils Relative: 0.5 % (ref 0.0–3.0)
EOS ABS: 0.1 10*3/uL (ref 0.0–0.7)
Eosinophils Relative: 3 % (ref 0.0–5.0)
HCT: 30 % — ABNORMAL LOW (ref 36.0–46.0)
HEMOGLOBIN: 10.5 g/dL — AB (ref 12.0–15.0)
LYMPHS PCT: 34.7 % (ref 12.0–46.0)
Lymphs Abs: 1.7 10*3/uL (ref 0.7–4.0)
MCHC: 34.9 g/dL (ref 30.0–36.0)
MCV: 86 fl (ref 78.0–100.0)
MONO ABS: 0.5 10*3/uL (ref 0.1–1.0)
Monocytes Relative: 9.8 % (ref 3.0–12.0)
Neutro Abs: 2.5 10*3/uL (ref 1.4–7.7)
Neutrophils Relative %: 52 % (ref 43.0–77.0)
Platelets: 183 10*3/uL (ref 150.0–400.0)
RBC: 3.49 Mil/uL — AB (ref 3.87–5.11)
RDW: 13.9 % (ref 11.5–15.5)
WBC: 4.8 10*3/uL (ref 4.0–10.5)

## 2018-02-13 NOTE — Telephone Encounter (Signed)
Patient scheduled.

## 2018-02-14 LAB — IRON,TIBC AND FERRITIN PANEL
%SAT: 24 % (ref 11–50)
FERRITIN: 107 ng/mL (ref 20–288)
IRON: 67 ug/dL (ref 45–160)
TIBC: 284 ug/dL (ref 250–450)

## 2018-02-27 DIAGNOSIS — L570 Actinic keratosis: Secondary | ICD-10-CM | POA: Diagnosis not present

## 2018-02-27 DIAGNOSIS — L821 Other seborrheic keratosis: Secondary | ICD-10-CM | POA: Diagnosis not present

## 2018-02-27 DIAGNOSIS — B372 Candidiasis of skin and nail: Secondary | ICD-10-CM | POA: Diagnosis not present

## 2018-02-27 DIAGNOSIS — T300 Burn of unspecified body region, unspecified degree: Secondary | ICD-10-CM | POA: Diagnosis not present

## 2018-03-29 ENCOUNTER — Other Ambulatory Visit: Payer: Self-pay | Admitting: Internal Medicine

## 2018-03-29 ENCOUNTER — Other Ambulatory Visit: Payer: Self-pay | Admitting: Family Medicine

## 2018-03-29 NOTE — Telephone Encounter (Signed)
Last OV 12/21/17 last filled by Dr.Sonnenberg 10/29/17 30 0rf

## 2018-04-01 NOTE — Telephone Encounter (Signed)
Clonazepam  Refill requires office visit.  Has not been used regularly in months and has not been refilled in moths

## 2018-04-08 DIAGNOSIS — H353131 Nonexudative age-related macular degeneration, bilateral, early dry stage: Secondary | ICD-10-CM | POA: Diagnosis not present

## 2018-04-13 ENCOUNTER — Other Ambulatory Visit: Payer: Self-pay | Admitting: Internal Medicine

## 2018-04-15 ENCOUNTER — Telehealth: Payer: Self-pay | Admitting: Internal Medicine

## 2018-04-15 NOTE — Telephone Encounter (Signed)
Medication has been pended for provider review

## 2018-04-15 NOTE — Telephone Encounter (Signed)
Refill for 90 days only.  OFFICE VISIT NEEDED in August prior to any more refills

## 2018-04-15 NOTE — Telephone Encounter (Signed)
LMTCB. Need to let pt know that she will need an office visit with Dr. Derrel Nip before she can refill her clonazepam. PEC may speak with pt.

## 2018-04-15 NOTE — Telephone Encounter (Signed)
Copied from [place] [id number]. Topic: Quick Communication - Rx Refill/Question >> Apr 15, 2018  9:10 AM Boyd Kerbs wrote: Medication:   clonazePAM (KLONOPIN) 0.5 MG tablet  Has the patient contacted their pharmacy? Yes (Agent: If no, request that the patient contact the pharmacy for the refill.) (Agent: If yes, when and what did the pharmacy advise?)  Preferred Pharmacy (with phone number or street name):  [facility] [place], [place] [address] Phone: [phone] Fax: [phone]    Agent: Please be advised that RX refills may take up to 3 business days. We ask that you follow-up with your pharmacy.

## 2018-04-15 NOTE — Telephone Encounter (Signed)
Refilled: 10/29/2017 Last OV: 12/21/2017 Next OV: not scheduled

## 2018-04-15 NOTE — Telephone Encounter (Signed)
appt scheduled for 04/18/2018. Rx has been printed, signed and faxed.

## 2018-04-18 ENCOUNTER — Encounter: Payer: Self-pay | Admitting: Internal Medicine

## 2018-04-18 ENCOUNTER — Ambulatory Visit (INDEPENDENT_AMBULATORY_CARE_PROVIDER_SITE_OTHER): Payer: Medicare Other | Admitting: Internal Medicine

## 2018-04-18 VITALS — BP 118/62 | HR 59 | Temp 98.3°F | Resp 15 | Ht 65.0 in | Wt 156.6 lb

## 2018-04-18 DIAGNOSIS — D631 Anemia in chronic kidney disease: Secondary | ICD-10-CM | POA: Diagnosis not present

## 2018-04-18 DIAGNOSIS — E1169 Type 2 diabetes mellitus with other specified complication: Secondary | ICD-10-CM

## 2018-04-18 DIAGNOSIS — Z1231 Encounter for screening mammogram for malignant neoplasm of breast: Secondary | ICD-10-CM | POA: Diagnosis not present

## 2018-04-18 DIAGNOSIS — E1122 Type 2 diabetes mellitus with diabetic chronic kidney disease: Secondary | ICD-10-CM | POA: Diagnosis not present

## 2018-04-18 DIAGNOSIS — F5105 Insomnia due to other mental disorder: Secondary | ICD-10-CM | POA: Diagnosis not present

## 2018-04-18 DIAGNOSIS — E785 Hyperlipidemia, unspecified: Secondary | ICD-10-CM

## 2018-04-18 DIAGNOSIS — I129 Hypertensive chronic kidney disease with stage 1 through stage 4 chronic kidney disease, or unspecified chronic kidney disease: Secondary | ICD-10-CM | POA: Diagnosis not present

## 2018-04-18 DIAGNOSIS — N183 Chronic kidney disease, stage 3 unspecified: Secondary | ICD-10-CM

## 2018-04-18 DIAGNOSIS — R8290 Unspecified abnormal findings in urine: Secondary | ICD-10-CM

## 2018-04-18 DIAGNOSIS — Z1239 Encounter for other screening for malignant neoplasm of breast: Secondary | ICD-10-CM

## 2018-04-18 DIAGNOSIS — F409 Phobic anxiety disorder, unspecified: Secondary | ICD-10-CM

## 2018-04-18 DIAGNOSIS — E7800 Pure hypercholesterolemia, unspecified: Secondary | ICD-10-CM

## 2018-04-18 DIAGNOSIS — R3982 Chronic bladder pain: Secondary | ICD-10-CM | POA: Diagnosis not present

## 2018-04-18 DIAGNOSIS — K581 Irritable bowel syndrome with constipation: Secondary | ICD-10-CM

## 2018-04-18 LAB — CBC WITH DIFFERENTIAL/PLATELET
BASOS PCT: 0.5 % (ref 0.0–3.0)
Basophils Absolute: 0 10*3/uL (ref 0.0–0.1)
EOS ABS: 0.2 10*3/uL (ref 0.0–0.7)
EOS PCT: 3.8 % (ref 0.0–5.0)
HCT: 31 % — ABNORMAL LOW (ref 36.0–46.0)
Hemoglobin: 10.6 g/dL — ABNORMAL LOW (ref 12.0–15.0)
LYMPHS ABS: 1.3 10*3/uL (ref 0.7–4.0)
Lymphocytes Relative: 30.3 % (ref 12.0–46.0)
MCHC: 34.1 g/dL (ref 30.0–36.0)
MCV: 86.4 fl (ref 78.0–100.0)
MONO ABS: 0.3 10*3/uL (ref 0.1–1.0)
Monocytes Relative: 8.2 % (ref 3.0–12.0)
NEUTROS PCT: 57.2 % (ref 43.0–77.0)
Neutro Abs: 2.4 10*3/uL (ref 1.4–7.7)
Platelets: 211 10*3/uL (ref 150.0–400.0)
RBC: 3.59 Mil/uL — AB (ref 3.87–5.11)
RDW: 13.6 % (ref 11.5–15.5)
WBC: 4.2 10*3/uL (ref 4.0–10.5)

## 2018-04-18 LAB — COMPREHENSIVE METABOLIC PANEL
ALBUMIN: 4.4 g/dL (ref 3.5–5.2)
ALK PHOS: 71 U/L (ref 39–117)
ALT: 18 U/L (ref 0–35)
AST: 29 U/L (ref 0–37)
BUN: 26 mg/dL — AB (ref 6–23)
CO2: 29 mEq/L (ref 19–32)
CREATININE: 1.16 mg/dL (ref 0.40–1.20)
Calcium: 9.4 mg/dL (ref 8.4–10.5)
Chloride: 106 mEq/L (ref 96–112)
GFR: 48.29 mL/min — ABNORMAL LOW (ref >60.00)
GLUCOSE: 138 mg/dL — AB (ref 70–99)
Potassium: 4.4 mEq/L (ref 3.5–5.1)
SODIUM: 142 meq/L (ref 135–145)
TOTAL PROTEIN: 6.8 g/dL (ref 6.0–8.3)
Total Bilirubin: 0.3 mg/dL (ref 0.2–1.2)

## 2018-04-18 LAB — POCT URINALYSIS DIPSTICK
BILIRUBIN UA: NEGATIVE
GLUCOSE UA: NEGATIVE
Ketones, UA: NEGATIVE
Nitrite, UA: NEGATIVE
Protein, UA: NEGATIVE
RBC UA: NEGATIVE
Spec Grav, UA: 1.015 (ref 1.010–1.025)
Urobilinogen, UA: 0.2 E.U./dL
pH, UA: 5 (ref 5.0–8.0)

## 2018-04-18 LAB — HEMOGLOBIN A1C: Hgb A1c MFr Bld: 6.7 % — ABNORMAL HIGH (ref 4.6–6.5)

## 2018-04-18 LAB — MICROALBUMIN / CREATININE URINE RATIO
CREATININE, U: 96.5 mg/dL
Microalb Creat Ratio: 0.9 mg/g (ref 0.0–30.0)
Microalb, Ur: 0.9 mg/dL (ref 0.0–1.9)

## 2018-04-18 LAB — LDL CHOLESTEROL, DIRECT: LDL DIRECT: 48 mg/dL

## 2018-04-18 LAB — URINALYSIS, MICROSCOPIC ONLY: RBC / HPF: NONE SEEN (ref 0–?)

## 2018-04-18 NOTE — Patient Instructions (Signed)
I recommend resuming IBGaurd every day as a maintenance medication  Your lower abdominal swelling is part of your IBS,  It is not coming from  your bladder   Reduce the Bentyl (dicyclomine) if you are not having abdominal cramping after eating   You DO HAVE prolapse based on your exam today,  So I I will try to get you an appointment with Zara Council , or the urologists at [facility] in [facility] ordered

## 2018-04-18 NOTE — Progress Notes (Signed)
Subjective:  Patient ID: Sabrina Montoya, female    DOB: 04-24-1941  Age: 77 y.o. MRN: [id number]  CC: The primary encounter diagnosis was CKD (chronic kidney disease) stage 3, GFR 30-59 ml/min ([place]). Diagnoses of Anemia due to stage 3 chronic kidney disease ([place]), Chronic bladder pain, Breast cancer screening, Hyperlipidemia associated with type 2 diabetes mellitus ([place]), Type 2 DM with CKD stage 3 and hypertension ([place]), Abnormal result on screening urine test, Irritable bowel syndrome with constipation, Pure hypercholesterolemia, and Insomnia due to anxiety and fear were also pertinent to this visit.  HPI Sabrina Montoya presents for follow up on GAD managed with clonazepam.  Depression managed with wellbutrin,  And other chronic issues  Ran out of clonazepam  Uses 1/2 tablet at bedtime  To manage anxiety and chronic insomnia  Last seen Feb 2019 for chronc bladder pain and was referred to new urologist .  She did not return for scheduled  pelvic exam which he recommended prior to ordering Physical Therapy.   She continues to report persistent bladder pain,, persistent lower abdominal distension and is requesting another opinion by a  female urologist .   She saw her gastroenterologist for the persistent abdominal pain of unclear etiology.  she had aContrasted CT in 2018 to rule out diverticulitis, a plain abd film which confirmed constipation,  And an ER visit for abd pain that resulted in a renal stone CT).  She is now being treated for IBS but is not taking the IBGuard regularly since symtoms resolve and now they have returned   She is taking Bentyl 3 to 4 times daily   Treated for BV Oct 2018  By Margarett,    cologuard normal Nov 2017  Mammogram normal 2017      Outpatient Medications Prior to Visit  Medication Sig Dispense Refill  . aspirin 81 MG tablet Take 81 mg by mouth daily.    Marland Kitchen buPROPion (WELLBUTRIN XL) 150 MG 24 hr tablet TAKE 1 TABLET BY MOUTH ONCE EVERY MORNING 90 tablet 1  .  cholecalciferol (VITAMIN D) 1000 UNITS tablet Take 1,000 Units by mouth daily.    . clonazePAM (KLONOPIN) 0.5 MG tablet Take 1 tablet (0.5 mg total) by mouth at bedtime as needed for anxiety. 30 tablet 2  . dicyclomine (BENTYL) 20 MG tablet Take 1 tablet (20 mg total) by mouth 2 (two) times daily. 60 tablet 3  . isosorbide mononitrate (IMDUR) 30 MG 24 hr tablet Take 30 mg by mouth daily.    . metoprolol tartrate (LOPRESSOR) 25 MG tablet TAKE 1/2 TABLET BY MOUTH TWICE DAILY 30 tablet 3  . Multiple Vitamins-Minerals (PRESERVISION AREDS 2 PO) Take by mouth 2 (two) times daily.    Marland Kitchen omeprazole (PRILOSEC) 20 MG capsule Take 1 capsule (20 mg total) by mouth daily. 30 capsule 2  . rosuvastatin (CRESTOR) 40 MG tablet Take by mouth.     No facility-administered medications prior to visit.     Review of Systems;  Patient denies headache, fevers, malaise, unintentional weight loss, skin rash, eye pain, sinus congestion and sinus pain, sore throat, dysphagia,  hemoptysis , cough, dyspnea, wheezing, chest pain, palpitations, orthopnea, edema, abdominal pain, nausea, melena, diarrhea, constipation, flank pain, dysuria, hematuria, urinary  Frequency, nocturia, numbness, tingling, seizures,  Focal weakness, Loss of consciousness,  Tremor, insomnia, depression, anxiety, and suicidal ideation.      Objective:  BP 118/62 (BP Location: Left Arm, Patient Position: Sitting, Cuff Size: Normal)   Pulse (!) 59  Temp 98.3 F (36.8 C) (Oral)   Resp 15   Ht 5\' 5"  (1.651 m)   Wt 156 lb 9.6 oz (71 kg)   SpO2 98%   BMI 26.06 kg/m   BP Readings from Last 3 Encounters:  04/18/18 118/62  02/12/18 124/70  01/03/18 (!) 144/70    Wt Readings from Last 3 Encounters:  04/18/18 156 lb 9.6 oz (71 kg)  02/12/18 156 lb 12.8 oz (71.1 kg)  01/03/18 155 lb 8 oz (70.5 kg)   General Appearance:    Alert, cooperative, no distress, appears stated age  Head:    Normocephalic, without obvious abnormality, atraumatic  Eyes:     PERRL, conjunctiva/corneas clear, EOM's intact, fundi    benign, both eyes  Ears:    Normal TM's and external ear canals, both ears  Nose:   Nares normal, septum midline, mucosa normal, no drainage    or sinus tenderness  Throat:   Lips, mucosa, and tongue normal; teeth and gums normal  Neck:   Supple, symmetrical, trachea midline, no adenopathy;    thyroid:  no enlargement/tenderness/nodules; no carotid   bruit or JVD  Back:     Symmetric, no curvature, ROM normal, no CVA tenderness  Lungs:     Clear to auscultation bilaterally, respirations unlabored  Chest Wall:    No tenderness or deformity   Heart:    Regular rate and rhythm, S1 and S2 normal, no murmur, rub   or gallop  Breast Exam:    No tenderness, masses, or nipple abnormality  Abdomen:     Soft, non-tender, bowel sounds active all four quadrants,    no masses, no organomegaly  Genitalia:    Pelvic: cervix absent,uterus absent , atrophic appearance, external genitalia normal, no adnexal masses or tenderness, no cervical motion tenderness, rectovaginal septum normal, vagina normal without discharge.   Extremities:   Extremities normal, atraumatic, no cyanosis or edema  Pulses:   2+ and symmetric all extremities  Skin:   Skin color, texture, turgor normal, no rashes or lesions  Lymph nodes:   Cervical, supraclavicular, and axillary nodes normal  Neurologic:   CNII-XII intact, normal strength, sensation and reflexes    throughout    Lab Results  Component Value Date   HGBA1C 6.7 (H) 04/18/2018   HGBA1C 6.4 11/13/2017   HGBA1C 6.7 (H) 02/09/2017    Lab Results  Component Value Date   CREATININE 1.16 04/18/2018   CREATININE 1.15 11/13/2017   CREATININE 1.30 (H) 08/15/2017    Lab Results  Component Value Date   WBC 4.2 04/18/2018   HGB 10.6 (L) 04/18/2018   HCT 31.0 (L) 04/18/2018   PLT 211.0 04/18/2018   GLUCOSE 138 (H) 04/18/2018   CHOL 212 (H) 11/13/2017   TRIG 245.0 (H) 11/13/2017   HDL 32.80 (L) 11/13/2017     LDLDIRECT 48.0 04/18/2018   LDLCALC 53 02/09/2017   ALT 18 04/18/2018   AST 29 04/18/2018   NA 142 04/18/2018   K 4.4 04/18/2018   CL 106 04/18/2018   CREATININE 1.16 04/18/2018   BUN 26 (H) 04/18/2018   CO2 29 04/18/2018   TSH 1.79 03/07/2016   HGBA1C 6.7 (H) 04/18/2018   MICROALBUR 0.9 04/18/2018    Ct Renal Stone Study  Result Date: 08/15/2017 CLINICAL DATA:  Unspecified abdominal pain. Suspected diverticulitis. EXAM: CT ABDOMEN AND PELVIS WITHOUT CONTRAST TECHNIQUE: Multidetector CT imaging of the abdomen and pelvis was performed following the standard protocol without IV contrast. COMPARISON:  08/02/2017 FINDINGS:  Lower chest:  No acute finding.  Mitral annular calcification. Hepatobiliary: No focal liver abnormality.Cholecystectomy. Normal common bile duct diameter. Pancreas: Unremarkable. Spleen: Unremarkable. Adrenals/Urinary Tract: Negative adrenals. Elongated parenchymal calcification in the right anterior hilar lip measuring up to 7 mm in length. Bilateral renal cystic densities. Tiny high-density areas superficially within the bilateral renal cortex which are likely proteinaceous cysts. Status post contrast-enhanced CT 08/02/2017. No hydronephrosis. Unremarkable bladder. Stomach/Bowel: Extensive colonic diverticulosis. Inflammation at the descending sigmoid junction seen previously is resolved. Appendectomy.  Negative for bowel obstruction Vascular/Lymphatic: Extensive atherosclerotic calcification of the aorta and its branches. There is fusiform aneurysmal enlargement of the infrarenal aorta measuring up to 3.8 cm diameter. No mass or adenopathy. Reproductive:Hysterectomy.  Negative adnexae. Other: No ascites or pneumoperitoneum. Musculoskeletal: No acute or aggressive finding. Diffuse spinal degeneration with postoperative changes to the lumbar posterior elements. IMPRESSION: 1. No acute finding. Resolved inflammation at the descending sigmoid junction. 2. Extensive distal colonic  diverticulosis. 3.  Aortic Atherosclerosis (ICD10-I70.0). 4. Aortic aneurysm NOS (ICD10-I71.9). Maximal infrarenal diameter is 3.8 cm. Recommend followup by ultrasound in 2 years. This recommendation follows ACR consensus guidelines: White Paper of the ACR Incidental Findings Committee II on Vascular Findings. J Am Coll Radiol 2013; 10:789-794. Electronically Signed   By: Monte Fantasia M.D.   On: 08/15/2017 12:34    Assessment & Plan:   Problem List Items Addressed This Visit    Anemia   CKD (chronic kidney disease) stage 3, GFR 30-59 ml/min ([facility]) - Primary   Relevant Orders   Comprehensive metabolic panel (Completed)   CBC with Differential/Platelet (Completed)   Type 2 DM with CKD stage 3 and hypertension ([place])    Historically well-controlled on diet alone , but she is months overdue for follow up. Patient is up-to-date on eye exams and overdue for labs.  foot exam has been done today. Patient has no history of microalbuminuria. Patient is tolerating statin therapy for CAD risk reduction and on ACE/ARB for renal protection and hypertension .    Lab Results  Component Value Date   HGBA1C 6.7 (H) 04/18/2018   Lab Results  Component Value Date   MICROALBUR 0.9 04/18/2018   Lab Results  Component Value Date   CREATININE 1.16 04/18/2018         Relevant Medications   rosuvastatin (CRESTOR) 40 MG tablet   Other Relevant Orders   Comprehensive metabolic panel (Completed)   Microalbumin / creatinine urine ratio (Completed)   Hemoglobin A1c (Completed)   Irritable bowel syndrome (IBS)    Advised to resume daily  use of IBGuard       Insomnia due to anxiety and fear    Clonazepam refilled.  The risks and benefits of benzodiazepine use were reviewed with patient today including excessive sedation leading to respiratory depression,  impaired thinking/driving, and addiction.  Patient was advised to avoid concurrent use with alcohol, to use medication only as needed and not to share  with others  .       Hyperlipidemia    With known CAD. Marland KitchenLDL is at goal on Crestor    Lab Results  Component Value Date   CHOL 212 (H) 11/13/2017   HDL 32.80 (L) 11/13/2017   LDLCALC 53 02/09/2017   LDLDIRECT 48.0 04/18/2018   TRIG 245.0 (H) 11/13/2017   CHOLHDL 6 11/13/2017   Lab Results  Component Value Date   ALT 18 04/18/2018   AST 29 04/18/2018   ALKPHOS 71 04/18/2018   BILITOT 0.3 04/18/2018  Relevant Medications   rosuvastatin (CRESTOR) 40 MG tablet   Chronic bladder pain    Urine was cultured and normal .  She is being referred to a local female urologist for ongoing symptoms that, per Dr Sharlett Iles, are related to IBS and chronic constipation      Relevant Orders   POCT urinalysis dipstick (Completed)   Urinalysis, Routine w reflex microscopic   Urine Culture (Completed)   Breast cancer screening    Mammogram ordered       Relevant Orders   MM 3D SCREEN BREAST BILATERAL    Other Visit Diagnoses    Hyperlipidemia associated with type 2 diabetes mellitus ([place])       Relevant Medications   rosuvastatin (CRESTOR) 40 MG tablet   Other Relevant Orders   LDL cholesterol, direct (Completed)   Abnormal result on screening urine test       Relevant Orders   Urine Microscopic Only (Completed)     A total of 40 minutes was spent with patient more than half of which was spent in counseling patient on the above mentioned issues , reviewing and explaining recent labs and imaging studies done, and coordination of care.  I am having Brelynn Wheller. Vallone maintain her cholecalciferol, aspirin, isosorbide mononitrate, Multiple Vitamins-Minerals (PRESERVISION AREDS 2 PO), omeprazole, dicyclomine, metoprolol tartrate, buPROPion, clonazePAM, and rosuvastatin.  No orders of the defined types were placed in this encounter.   There are no discontinued medications.  Follow-up: Return in about 6 months (around 10/18/2018) for follow up diabetes.   Crecencio Mc, MD

## 2018-04-19 LAB — URINE CULTURE
MICRO NUMBER: 90710421
Result:: NO GROWTH
SPECIMEN QUALITY:: ADEQUATE

## 2018-04-20 DIAGNOSIS — F5105 Insomnia due to other mental disorder: Secondary | ICD-10-CM | POA: Insufficient documentation

## 2018-04-20 DIAGNOSIS — F409 Phobic anxiety disorder, unspecified: Secondary | ICD-10-CM | POA: Insufficient documentation

## 2018-04-20 NOTE — Assessment & Plan Note (Signed)
With known CAD. Marland KitchenLDL is at goal on Crestor    Lab Results  Component Value Date   CHOL 212 (H) 11/13/2017   HDL 32.80 (L) 11/13/2017   LDLCALC 53 02/09/2017   LDLDIRECT 48.0 04/18/2018   TRIG 245.0 (H) 11/13/2017   CHOLHDL 6 11/13/2017   Lab Results  Component Value Date   ALT 18 04/18/2018   AST 29 04/18/2018   ALKPHOS 71 04/18/2018   BILITOT 0.3 04/18/2018

## 2018-04-20 NOTE — Assessment & Plan Note (Signed)
Clonazepam refilled.  The risks and benefits of benzodiazepine use were reviewed with patient today including excessive sedation leading to respiratory depression,  impaired thinking/driving, and addiction.  Patient was advised to avoid concurrent use with alcohol, to use medication only as needed and not to share with others  .

## 2018-04-20 NOTE — Assessment & Plan Note (Signed)
Urine was cultured and normal .  She is being referred to a local female urologist for ongoing symptoms that, per Dr Sharlett Iles, are related to IBS and chronic constipation

## 2018-04-20 NOTE — Assessment & Plan Note (Signed)
Mammogram ordered

## 2018-04-20 NOTE — Assessment & Plan Note (Signed)
Advised to resume daily  use of IBGuard

## 2018-04-20 NOTE — Assessment & Plan Note (Signed)
Historically well-controlled on diet alone , but she is months overdue for follow up. Patient is up-to-date on eye exams and overdue for labs.  foot exam has been done today. Patient has no history of microalbuminuria. Patient is tolerating statin therapy for CAD risk reduction and on ACE/ARB for renal protection and hypertension .    Lab Results  Component Value Date   HGBA1C 6.7 (H) 04/18/2018   Lab Results  Component Value Date   MICROALBUR 0.9 04/18/2018   Lab Results  Component Value Date   CREATININE 1.16 04/18/2018

## 2018-04-24 NOTE — Telephone Encounter (Signed)
Why am I getting this now when it was requested  3 weeks ago?  Refill  Was done last week  On June 10

## 2018-05-15 ENCOUNTER — Telehealth: Payer: Self-pay

## 2018-05-15 NOTE — Telephone Encounter (Signed)
Spoke with patient in regards to carotid ultrasound Patient was over due for a year follow up Patient refused as she follows up with her carotid ultrasound every year with [facility]

## 2018-05-31 ENCOUNTER — Other Ambulatory Visit: Payer: Self-pay

## 2018-05-31 ENCOUNTER — Encounter: Payer: Self-pay | Admitting: Urology

## 2018-05-31 ENCOUNTER — Ambulatory Visit (INDEPENDENT_AMBULATORY_CARE_PROVIDER_SITE_OTHER): Payer: Medicare Other | Admitting: Urology

## 2018-05-31 VITALS — BP 96/56 | HR 70 | Ht 63.0 in | Wt 155.0 lb

## 2018-05-31 DIAGNOSIS — G8929 Other chronic pain: Secondary | ICD-10-CM

## 2018-05-31 DIAGNOSIS — N952 Postmenopausal atrophic vaginitis: Secondary | ICD-10-CM

## 2018-05-31 DIAGNOSIS — R102 Pelvic and perineal pain: Secondary | ICD-10-CM

## 2018-05-31 DIAGNOSIS — R3912 Poor urinary stream: Secondary | ICD-10-CM

## 2018-05-31 DIAGNOSIS — N8111 Cystocele, midline: Secondary | ICD-10-CM

## 2018-05-31 LAB — URINALYSIS, COMPLETE
Bilirubin, UA: NEGATIVE
Glucose, UA: NEGATIVE
Ketones, UA: NEGATIVE
Nitrite, UA: NEGATIVE
Protein, UA: NEGATIVE
RBC, UA: NEGATIVE
SPEC GRAV UA: 1.02 (ref 1.005–1.030)
Urobilinogen, Ur: 0.2 mg/dL (ref 0.2–1.0)
pH, UA: 5 (ref 5.0–7.5)

## 2018-05-31 LAB — MICROSCOPIC EXAMINATION: RBC, UA: NONE SEEN /hpf (ref 0–2)

## 2018-05-31 LAB — BLADDER SCAN AMB NON-IMAGING

## 2018-05-31 MED ORDER — CIPROFLOXACIN HCL 500 MG PO TABS: 500.0000 mg | ORAL_TABLET | Freq: Once | ORAL | Status: DC

## 2018-05-31 MED ORDER — LIDOCAINE HCL URETHRAL/MUCOSAL 2 % EX GEL
1.0000 "application " | Freq: Once | CUTANEOUS | Status: AC
  Administered 2018-05-31: 1 via URETHRAL

## 2018-05-31 MED ORDER — CIPROFLOXACIN HCL 500 MG PO TABS
500.0000 mg | ORAL_TABLET | Freq: Once | ORAL | Status: AC
  Administered 2018-05-31: 500 mg via ORAL

## 2018-05-31 MED ORDER — ESTROGENS, CONJUGATED 0.625 MG/GM VA CREA: 1.0000 | TOPICAL_CREAM | Freq: Every day | VAGINAL | 12 refills | Status: DC

## 2018-05-31 NOTE — Progress Notes (Signed)
05/31/18  CC:  Chief Complaint  Patient presents with  . Cysto    HPI: 77 year old female who presents today with ongoing pelvic complaints as well as some associated urinary symptoms who presents for pelvic exam and cystoscopy.  She was initially seen and evaluated by Dr. Baruch Gouty on 12/2017.  Today, she reports that she has intermittent lower abdominal swelling especially in the morning time.  This is unrelated to her urinary symptoms and does not resolve after she voids.  In addition, she reports that she has urinary urgency and when she gets to the bathroom, has to sometimes leaning forward to void.  She does have a personal history of pelvic organ prolapse status post hysterectomy and cystocele repair in 2016 by your gynecologist in [facility] (Dr. Blima Rich).  Per the operative report, this was previously a complete uterovaginal prolapse, third-degree.  She underwent total vaginal hysterectomy, uterosacral ligament suspension, anterior colporrhaphy and cystoscopy.  Blood pressure (!) 96/56, pulse 70, height 5\' 3"  (1.6 m), weight 155 lb (70.3 kg). NED. A&Ox3.   No respiratory distress   Abd soft, NT, ND Normal external genitalia with patent urethral meatus Pelvic exam did show significant stage II cystocele to the level of the introitus, stage I rectocele with some mild apical descent.  Vaginal mucosa atrophic.  UA today with >10 squamous epithelial cells, otherwise negative.  Cystoscopy Procedure Note  Patient identification was confirmed, informed consent was obtained, and patient was prepped using Betadine solution.  Lidocaine jelly was administered per urethral meatus.    Preoperative abx where received prior to procedure.    Procedure: - Flexible cystoscope introduced, without any difficulty.   - Thorough search of the bladder revealed:    normal urethral meatus    normal urothelium    no stones    no ulcers     no tumors    no urethral polyps    Moderate  trabeculation with distention as well as [place] diverticula laterally  -To visualize the trigone, I had to reduce her prolapse  Post-Procedure: - Patient tolerated the procedure well  PVR 0 cc  Assessment/ Plan:  1. Chronic pelvic pain in female Cystoscopy fairly unremarkable today other than bladder prolapse Pelvic discomfort/pressure unrelated to voiding symptoms, failed to improve when bladder is emptied I do think that she could benefit strongly from pelvic floor physical therapy as suggested by Dr. Pilar Jarvis, discussed this in detail with her and she is agreeable with pursuing this referral - Urinalysis, Complete - lidocaine (XYLOCAINE) 2 % jelly 1 application - ciprofloxacin (CIPRO) tablet 500 mg - Bladder Scan (Post Void Residual) in office  2. Cystocele, midline Cystocele to the level of the vaginal introitus appreciated today We discussed options for management of this including conservative, pessary, or revision surgery Based on her history, she does not seem to be particularly bothered by her prolapse symptoms specifically In addition, her prolapse is significantly improved compared to what was previously described prior to her surgery in 2016 Offered referral for pessary fitting, declined at this time May benefit from physical therapy for this as well  3. Atrophic vaginitis Slight vaginal atrophy appreciated today, would likely benefit from topical estrogen cream She is use this in the past Prescription sent to pharmacy, advised to use pea-sized amount 3 times weekly  4. Weak urinary stream The position of her bladder, is not surprising that she has to use a Crede maneuver to completely empty PVR today is 0 which is reassuring  Hollice Espy, MD

## 2018-06-04 ENCOUNTER — Telehealth: Payer: Self-pay | Admitting: Family Medicine

## 2018-06-04 NOTE — Telephone Encounter (Signed)
Patient notified  Premarin 0.625mg  approved until further notice 06/04/2018 Ticket# [id number]

## 2018-06-05 ENCOUNTER — Ambulatory Visit (INDEPENDENT_AMBULATORY_CARE_PROVIDER_SITE_OTHER): Payer: Medicare Other | Admitting: Internal Medicine

## 2018-06-05 ENCOUNTER — Telehealth: Payer: Self-pay

## 2018-06-05 ENCOUNTER — Encounter: Payer: Self-pay | Admitting: Internal Medicine

## 2018-06-05 DIAGNOSIS — M25551 Pain in right hip: Secondary | ICD-10-CM

## 2018-06-05 DIAGNOSIS — R5383 Other fatigue: Secondary | ICD-10-CM

## 2018-06-05 DIAGNOSIS — F5105 Insomnia due to other mental disorder: Secondary | ICD-10-CM

## 2018-06-05 DIAGNOSIS — E7800 Pure hypercholesterolemia, unspecified: Secondary | ICD-10-CM | POA: Diagnosis not present

## 2018-06-05 DIAGNOSIS — F409 Phobic anxiety disorder, unspecified: Secondary | ICD-10-CM

## 2018-06-05 DIAGNOSIS — K581 Irritable bowel syndrome with constipation: Secondary | ICD-10-CM | POA: Diagnosis not present

## 2018-06-05 MED ORDER — ROSUVASTATIN CALCIUM 40 MG PO TABS: 40.0000 mg | ORAL_TABLET | Freq: Every day | ORAL | 0 refills | Status: DC

## 2018-06-05 NOTE — Progress Notes (Signed)
Subjective:  Patient ID: Sabrina Montoya, female    DODe SolB: Jun 10, 1941  Age: 77 y.o. MRN: [id number]  CC: Diagnoses of Insomnia due to anxiety and fear, Irritable bowel syndrome with constipation, Fatigue, unspecified type, Right hip pain, and Pure hypercholesterolemia were pertinent to this visit.  HPI Sabrina Montoya presents for multiple complaints:  low energy and hypersomnia for the past week ,  trouble getting up in the morning.  falling asleep during the day . No longer taking clonazepam or bentyl,  But taking wellbutrin.  No prior sleep study.  Snores,  Wakes up exhausted   Taking Imdur 30 mg and metoprolol 12.5 mg twice daily.  bp low.     Dr Epimenio Foot treating her IBS with IB Guard  But can't afford the medication so she is taking an OTC substitute  The abdominal swelling  is much better.   Stopped taking the crestor  Because she thinks her  Cardiologist stopped it.  Reviewed her cardiologist's last note which recommended continuing Crestor for goal LDL of 70.    Hip and back pain much worse .  Aggravated by [employer] etc.  Not taking anything for pain     Outpatient Medications Prior to Visit  Medication Sig Dispense Refill  . aspirin 81 MG tablet Take 81 mg by mouth daily.    Marland Kitchen buPROPion (WELLBUTRIN XL) 150 MG 24 hr tablet TAKE 1 TABLET BY MOUTH ONCE EVERY MORNING 90 tablet 1  . cholecalciferol (VITAMIN D) 1000 UNITS tablet Take 1,000 Units by mouth daily.    Marland Kitchen conjugated estrogens (PREMARIN) vaginal cream Place 1 Applicatorful vaginally daily. Use pea sized amount M-W-Fr before bedtime 42.5 g 12  . dicyclomine (BENTYL) 20 MG tablet Take 1 tablet (20 mg total) by mouth 2 (two) times daily. 60 tablet 3  . isosorbide mononitrate (IMDUR) 30 MG 24 hr tablet Take 30 mg by mouth daily.    . metoprolol tartrate (LOPRESSOR) 25 MG tablet TAKE 1/2 TABLET BY MOUTH TWICE DAILY 30 tablet 3  . Multiple Vitamins-Minerals (PRESERVISION AREDS 2 PO) Take by mouth 2 (two) times daily.    Marland Kitchen  omeprazole (PRILOSEC) 20 MG capsule Take 1 capsule (20 mg total) by mouth daily. (Patient not taking: Reported on 06/05/2018) 30 capsule 2  . clonazePAM (KLONOPIN) 0.5 MG tablet Take 1 tablet (0.5 mg total) by mouth at bedtime as needed for anxiety. (Patient not taking: Reported on 06/05/2018) 30 tablet 2  . rosuvastatin (CRESTOR) 40 MG tablet Take by mouth.     No facility-administered medications prior to visit.     Review of Systems;  Patient denies headache, fevers, malaise, unintentional weight loss, skin rash, eye pain, sinus congestion and sinus pain, sore throat, dysphagia,  hemoptysis , cough, dyspnea, wheezing, chest pain, palpitations, orthopnea, edema, abdominal pain, nausea, melena, diarrhea, constipation, flank pain, dysuria, hematuria, urinary  Frequency, nocturia, numbness, tingling, seizures,  Focal weakness, Loss of consciousness,  Tremor, insomnia, depression, anxiety, and suicidal ideation.      Objective:  BP 106/60 (BP Location: Left Arm, Patient Position: Sitting, Cuff Size: Normal)   Pulse 68   Temp 98.4 F (36.9 C) (Oral)   Resp 15   Ht 5\' 3"  (1.6 m)   Wt 156 lb (70.8 kg)   SpO2 97%   BMI 27.63 kg/m   BP Readings from Last 3 Encounters:  06/06/18 112/64  06/05/18 106/60  05/31/18 (!) 96/56    Wt Readings from Last 3 Encounters:  06/05/18  156 lb (70.8 kg)  05/31/18 155 lb (70.3 kg)  04/18/18 156 lb 9.6 oz (71 kg)    General appearance: alert, cooperative and appears stated age Ears: normal TM's and external ear canals both ears Throat: lips, mucosa, and tongue normal; teeth and gums normal Neck: no adenopathy, no carotid bruit, supple, symmetrical, trachea midline and thyroid not enlarged, symmetric, no tenderness/mass/nodules Back: symmetric, no curvature. ROM normal. No CVA tenderness. Lungs: clear to auscultation bilaterally Heart: regular rate and rhythm, S1, S2 normal, no murmur, click, rub or gallop Abdomen: soft, non-tender; bowel sounds  normal; no masses,  no organomegaly Pulses: 2+ and symmetric Skin: Skin color, texture, turgor normal. No rashes or lesions Lymph nodes: Cervical, supraclavicular, and axillary nodes normal.  Lab Results  Component Value Date   HGBA1C 6.7 (H) 04/18/2018   HGBA1C 6.4 11/13/2017   HGBA1C 6.7 (H) 02/09/2017    Lab Results  Component Value Date   CREATININE 1.16 04/18/2018   CREATININE 1.15 11/13/2017   CREATININE 1.30 (H) 08/15/2017    Lab Results  Component Value Date   WBC 4.2 04/18/2018   HGB 10.6 (L) 04/18/2018   HCT 31.0 (L) 04/18/2018   PLT 211.0 04/18/2018   GLUCOSE 138 (H) 04/18/2018   CHOL 212 (H) 11/13/2017   TRIG 245.0 (H) 11/13/2017   HDL 32.80 (L) 11/13/2017   LDLDIRECT 48.0 04/18/2018   LDLCALC 53 02/09/2017   ALT 18 04/18/2018   AST 29 04/18/2018   NA 142 04/18/2018   K 4.4 04/18/2018   CL 106 04/18/2018   CREATININE 1.16 04/18/2018   BUN 26 (H) 04/18/2018   CO2 29 04/18/2018   TSH 1.79 03/07/2016   HGBA1C 6.7 (H) 04/18/2018   MICROALBUR 0.9 04/18/2018    Ct Renal Stone Study  Result Date: 08/15/2017 CLINICAL DATA:  Unspecified abdominal pain. Suspected diverticulitis. EXAM: CT ABDOMEN AND PELVIS WITHOUT CONTRAST TECHNIQUE: Multidetector CT imaging of the abdomen and pelvis was performed following the standard protocol without IV contrast. COMPARISON:  08/02/2017 FINDINGS: Lower chest:  No acute finding.  Mitral annular calcification. Hepatobiliary: No focal liver abnormality.Cholecystectomy. Normal common bile duct diameter. Pancreas: Unremarkable. Spleen: Unremarkable. Adrenals/Urinary Tract: Negative adrenals. Elongated parenchymal calcification in the right anterior hilar lip measuring up to 7 mm in length. Bilateral renal cystic densities. Tiny high-density areas superficially within the bilateral renal cortex which are likely proteinaceous cysts. Status post contrast-enhanced CT 08/02/2017. No hydronephrosis. Unremarkable bladder. Stomach/Bowel:  Extensive colonic diverticulosis. Inflammation at the descending sigmoid junction seen previously is resolved. Appendectomy.  Negative for bowel obstruction Vascular/Lymphatic: Extensive atherosclerotic calcification of the aorta and its branches. There is fusiform aneurysmal enlargement of the infrarenal aorta measuring up to 3.8 cm diameter. No mass or adenopathy. Reproductive:Hysterectomy.  Negative adnexae. Other: No ascites or pneumoperitoneum. Musculoskeletal: No acute or aggressive finding. Diffuse spinal degeneration with postoperative changes to the lumbar posterior elements. IMPRESSION: 1. No acute finding. Resolved inflammation at the descending sigmoid junction. 2. Extensive distal colonic diverticulosis. 3.  Aortic Atherosclerosis (ICD10-I70.0). 4. Aortic aneurysm NOS (ICD10-I71.9). Maximal infrarenal diameter is 3.8 cm. Recommend followup by ultrasound in 2 years. This recommendation follows ACR consensus guidelines: White Paper of the ACR Incidental Findings Committee II on Vascular Findings. J Am Coll Radiol 2013; 10:789-794. Electronically Signed   By: Monte Fantasia M.D.   On: 08/15/2017 12:34    Assessment & Plan:   Problem List Items Addressed This Visit    Hyperlipidemia    She has again stopped crestor mistakenly.  She  has had no side effects from the medication and has been advised to resume it.   Lab Results  Component Value Date   CHOL 212 (H) 11/13/2017   HDL 32.80 (L) 11/13/2017   LDLCALC 53 02/09/2017   LDLDIRECT 48.0 04/18/2018   TRIG 245.0 (H) 11/13/2017   CHOLHDL 6 11/13/2017         Relevant Medications   rosuvastatin (CRESTOR) 40 MG tablet   Right hip pain    Secondary to mild DJD noted in 2016 with prior orthopedic evaluation.  Tylenol and tramadol prescribed.       Irritable bowel syndrome (IBS)    Now managed with an OTC substitute for IB Guard.       Insomnia due to anxiety and fear    She is no longer using clonazepam to sleep      Fatigue     Sudden onset,  Suggestive of medication side effect.  .  Changing Imdur to nighttime.  Continue metoprolol given CAD and AAA.  If fatigue continues,  Will obtain a sleep study to rule out sleep apnea.       A total of 25 minutes of face to face time was spent with patient more than half of which was spent in counselling about the above mentioned conditions  and coordination of care  I have discontinued Sabrina Montoya's clonazePAM. I have also changed her rosuvastatin. Additionally, I am having her maintain her cholecalciferol, aspirin, isosorbide mononitrate, Multiple Vitamins-Minerals (PRESERVISION AREDS 2 PO), omeprazole, dicyclomine, metoprolol tartrate, buPROPion, and conjugated estrogens.  Meds ordered this encounter  Medications  . rosuvastatin (CRESTOR) 40 MG tablet    Sig: Take 1 tablet (40 mg total) by mouth daily.    Dispense:  90 tablet    Refill:  0    Medications Discontinued During This Encounter  Medication Reason  . rosuvastatin (CRESTOR) 40 MG tablet Reorder  . clonazePAM (KLONOPIN) 0.5 MG tablet     Follow-up: No follow-ups on file.   Crecencio Mc, MD

## 2018-06-05 NOTE — Telephone Encounter (Signed)
Spoke with pt and informed her that the order for the mammogram to be done at [facility] was placed on 04/18/2018. Explained to the pt that we are no longer allowed to schedule the appts for them. Gave the pt the number to schedule her appt. Pt gave a verbal understanding and stated that she would give them a call.

## 2018-06-05 NOTE — Patient Instructions (Addendum)
I want you to take 500 mg tylenol every 6 hours as a trial For your hip and back pain.   If you need additional pain control,  You can add tramadol 50 mg (1/2 to 1 tablet ) every 6 hours to your tylenol    Your blood pressure is too low . Change the Imdur to nighttime .  Resume the crestor!

## 2018-06-05 NOTE — Telephone Encounter (Signed)
Copied from [place] [id number]. Topic: Referral - Request >> Jun 05, 2018 11:09 AM Conception Chancy, NT wrote: Reason for CRM: patient is calling and states she needs the referral placed to for a breast exam. She states she does not want to go over to [facility] and she was told they either do the diagnostic breast exam or [facility] does and she would rather go there. She said she was told that Dr. Derrel Nip was going to place the order on 04/18/18.

## 2018-06-06 ENCOUNTER — Telehealth: Payer: Self-pay | Admitting: Internal Medicine

## 2018-06-06 ENCOUNTER — Ambulatory Visit: Payer: Medicare Other

## 2018-06-06 DIAGNOSIS — I959 Hypotension, unspecified: Secondary | ICD-10-CM

## 2018-06-06 NOTE — Progress Notes (Addendum)
Pt presented today to be shown how to use her home blood pressure machine per verbal order from Dr. Derrel Nip. Pt stated that she took it home put the batteries in it and couldn't get it to come on. Showed pt how to turn on the machine, set the time and date, showed pt how to put cuff on her arm and showed her how to operate the machine. The pt gave a verbal understanding on how to use the machine. Well pt was here I checked the pt's blood pressure with both her home machine and our manual cuff. The home machine reading was 110/62 pulse 69 and the reading with the manual cuff was 112/64 pulse 70. Made sure pt knew how to work machine and advised pt that she was good to go and if she had any further questions please feel free to contact our office.   Reviewed.  Dr Nicki Reaper

## 2018-06-06 NOTE — Telephone Encounter (Signed)
Spoke with pt to let her know that the order for the mammogram has been put in and scheduled for 06/13/2018 @ 9:40am at [facility]. Pt stated that she wanted it done at "[facility] across the rode". I explained to pt that [place] does not do the 3D mammograms unless she goes to the [facility] location. The pt stated "just leave it at [facility]".

## 2018-06-06 NOTE — Telephone Encounter (Signed)
Pt was in office and said she need a referral for her 3D MM. She would like to

## 2018-06-07 DIAGNOSIS — R5383 Other fatigue: Secondary | ICD-10-CM | POA: Insufficient documentation

## 2018-06-07 NOTE — Assessment & Plan Note (Signed)
Sudden onset,  Suggestive of medication side effect.  .  Changing Imdur to nighttime.  Continue metoprolol given CAD and AAA.  If fatigue continues,  Will obtain a sleep study to rule out sleep apnea.

## 2018-06-07 NOTE — Assessment & Plan Note (Signed)
Now managed with an OTC substitute for IB Guard.

## 2018-06-07 NOTE — Assessment & Plan Note (Addendum)
She is no longer using clonazepam to sleep

## 2018-06-07 NOTE — Assessment & Plan Note (Addendum)
She has again stopped crestor mistakenly.  She has had no side effects from the medication and has been advised to resume it.   Lab Results  Component Value Date   CHOL 212 (H) 11/13/2017   HDL 32.80 (L) 11/13/2017   LDLCALC 53 02/09/2017   LDLDIRECT 48.0 04/18/2018   TRIG 245.0 (H) 11/13/2017   CHOLHDL 6 11/13/2017

## 2018-06-07 NOTE — Assessment & Plan Note (Signed)
Secondary to mild DJD noted in 2016 with prior orthopedic evaluation.  Tylenol and tramadol prescribed.

## 2018-06-13 ENCOUNTER — Ambulatory Visit
Admission: RE | Admit: 2018-06-13 | Discharge: 2018-06-13 | Disposition: A | Discharge status: Home / Self Care | Payer: Medicare Other | Source: Ambulatory Visit | Attending: Internal Medicine | Admitting: Internal Medicine

## 2018-06-13 DIAGNOSIS — Z1239 Encounter for other screening for malignant neoplasm of breast: Secondary | ICD-10-CM

## 2018-06-13 DIAGNOSIS — Z1231 Encounter for screening mammogram for malignant neoplasm of breast: Secondary | ICD-10-CM | POA: Diagnosis not present

## 2018-06-14 ENCOUNTER — Ambulatory Visit: Payer: Self-pay | Admitting: Internal Medicine

## 2018-06-14 NOTE — Telephone Encounter (Signed)
  She called in concerned about her BP being lower than usual.   She got a new BP machine.   Had it checked at the office and it was accurate with the manuel cuff.   She is having episodes where she feels like she "can't breath and could pass out but doesn't".    She denies passing out/fainting.   "I'm just so tired".   "I didn't even go to a funeral today because I was so tired".   See triage notes.  I scheduled her with Philis Nettle, PA since Dr. Derrel Nip was booked.   I instructed the pt to  Go to the ED if she felt faint or fainted or her BP goes too low.   She verbalized understanding.   Reason for Disposition . [8] Fall in systolic BP > 20 mm Hg from normal AND [2] NOT dizzy, lightheaded, or weak  Answer Assessment - Initial Assessment Questions 1. BLOOD PRESSURE: "What is the blood pressure?" "Did you take at least two measurements 5 minutes apart?"     92/47 on her BP machine.  83/47. 2. ONSET: "When did you take your blood pressure?"     92/47 taken 1:30PM   Waited 45 minutes it was 83/47. 3. HOW: "How did you obtain the blood pressure?" (e.g., visiting nurse, automatic home BP monitor)     Automatic BP monitor.  It was checked in the office and it was accurate. 4. HISTORY: "Do you have a history of low blood pressure?" "What is your blood pressure normally?"     No.   Sometimes it will drop.   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 5. MEDICATIONS: "Are you taking any medications for blood pressure?" If yes: "Have they been changed recently?"     Yes. 6. PULSE RATE: "Do you know what your pulse rate is?"      It was 66 on BP machine.    That's normal for me. 7. OTHER SYMPTOMS: "Have you been sick recently?" "Have you had a recent injury?"     I feel like I could pass out.   OVer the last 3-4 weeks it's happened several times.   I felt like I could not breath and was going to pass out several times over the last 3 weeks.     I didn't say anything to Dr. Derrel Nip.   I don't think about it  until it happens.    She is unable to say how long they last.   I just feel like I can't breath when these episodes happen.    I don't know what is causes these episodes.     I'm just so tired.   I was supposed to go to a funeral this morning and did not go because I was so tired.     I've been tired for a long time.   My  Back has bothered me a lot lately.  I use the heating pad until it feels better.    I fall asleep.    My back swells on the left side.   Dr. Derrel Nip is aware of this.   8. PREGNANCY: "Is there any chance you are pregnant?" "When was your last menstrual period?"     N/A  Protocols used: LOW BLOOD PRESSURE-A-AH

## 2018-06-17 DIAGNOSIS — R140 Abdominal distension (gaseous): Secondary | ICD-10-CM | POA: Diagnosis not present

## 2018-06-18 ENCOUNTER — Ambulatory Visit (INDEPENDENT_AMBULATORY_CARE_PROVIDER_SITE_OTHER): Payer: Medicare Other | Admitting: Family Medicine

## 2018-06-18 ENCOUNTER — Encounter: Payer: Self-pay | Admitting: Family Medicine

## 2018-06-18 VITALS — BP 120/64 | HR 65 | Temp 98.7°F | Resp 15 | Wt 156.5 lb

## 2018-06-18 DIAGNOSIS — K581 Irritable bowel syndrome with constipation: Secondary | ICD-10-CM

## 2018-06-18 DIAGNOSIS — I959 Hypotension, unspecified: Secondary | ICD-10-CM

## 2018-06-18 DIAGNOSIS — M5432 Sciatica, left side: Secondary | ICD-10-CM

## 2018-06-18 NOTE — Progress Notes (Signed)
Subjective:    Patient ID: Sabrina Montoya, female    DOB: 02-14-1941, 77 y.o.   MRN: [id number]  HPI   Presents to clinic with concerns of Low BP readings. Currently takes Imdur 30mg  daily and metoprolol 12.5 mg BID for blood pressure control.   She reports she has had a few episodes where she has felt dizzy and needed to sit down, and upon checking blood pressure it was very low in the 80s to 180s over 40s to 50s.  Also complains of chronic low back pain.  States she will get up and start doing things around the house or guarding mainly, but have to stop and sit on heating pad.  States this is very frustrating for her.  Also has long-standing history of irritable bowel syndrome.  Reports waves of nausea that were hit her throughout the day.  States she takes Bentyl once daily. Usually eating a small amount of food helps nausea to improve.  Sees GI for this.   Patient Active Problem List   Diagnosis Date Noted  . Fatigue 06/07/2018  . Insomnia due to anxiety and fear 04/20/2018  . Chronic bladder pain 12/23/2017  . Candidiasis of skin 12/23/2017  . Irritable bowel syndrome (IBS) 12/23/2017  . Atrophic vaginitis 02/11/2017  . CKD (chronic kidney disease) stage 3, GFR 30-59 ml/min (HCC) 08/24/2016  . Dysphagia, pharyngoesophageal phase 06/20/2016  . Sciatica of left side 06/20/2016  . 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 Breast cancer screening 02/06/2016  . Depression with somatization 01/27/2015  . Cervicalgia 01/09/2015  . Cystocele 08/29/2014  . Anemia 05/02/2014  . TIA (transient ischemic attack) 09/11/2013  . Neuropathy 07/01/2013  . Overweight (BMI 25.0-29.9) 03/16/2013  . Chronic reflux esophagitis 12/29/2012  . Right hip pain 12/29/2012  . History of tobacco abuse 07/03/2012  . AAA (abdominal aortic aneurysm) without rupture ([place])   . CAD (coronary artery disease), autologous vein bypass graft   . Peripheral vascular disease ([place])   . Arthritis  02/29/2012  . Asymptomatic carotid artery stenosis 01/05/2012  . Hyperlipidemia   . Osteopenia 08/29/2011  . Vitamin D deficiency 08/29/2011   Social History   Tobacco Use  . Smoking status: Former Smoker    Packs/day: 0.50    Years: 30.00    Pack years: 15.00    Types: Cigarettes    Last attempt to quit: 04/01/2012    Years since quitting: 6.2  . Smokeless tobacco: Never Used  . Tobacco comment: quit for 2 years after sinus infection and 6 months after heart surgery  Substance Use Topics  . Alcohol use: No   Allergies as of 06/18/2018      Reactions   Niacin And Related Hives   Sertraline Hcl Other (See Comments)   Hallucinations,     Sulfa Drugs Cross Reactors Nausea Only   Tetracyclines & Related Hives   Latex Rash   RAST testing was NEGATIVE  for LATEX   Simvastatin Rash   *Antihyperlipidemics*; elevated LFT's.       Medication List        Accurate as of 06/18/18 10:58 AM. Always use your most recent med list.          aspirin 81 MG tablet Take 81 mg by mouth daily.   buPROPion 150 MG 24 hr tablet Commonly known as:  WELLBUTRIN XL TAKE 1 TABLET BY MOUTH ONCE EVERY MORNING   cholecalciferol 1000 units tablet Commonly known as:  VITAMIN D Take 1,000 Units by mouth daily.   conjugated estrogens vaginal cream Commonly known as:  PREMARIN Place 1 Applicatorful vaginally daily. Use pea sized amount M-W-Fr before bedtime   dicyclomine 20 MG tablet Commonly known as:  BENTYL Take 1 tablet (20 mg total) by mouth 2 (two) times daily.   isosorbide mononitrate 30 MG 24 hr tablet Commonly known as:  IMDUR Take 30 mg by mouth daily.   metoprolol tartrate 25 MG tablet Commonly known as:  LOPRESSOR TAKE 1/2 TABLET BY MOUTH TWICE DAILY   omeprazole 20 MG capsule Commonly known as:  PRILOSEC Take 1 capsule (20 mg total) by mouth daily.   PRESERVISION AREDS 2 PO Take by mouth 2 (two) times daily.   rosuvastatin 40 MG tablet Commonly known as:  CRESTOR Take  1 tablet (40 mg total) by mouth daily.      Review of Systems  Constitutional: Negative for chills, fever. +fatigue HENT: Negative for congestion, ear pain, sinus pain and sore throat.   Eyes: Negative.   Respiratory: Negative for cough, shortness of breath and wheezing.   Cardiovascular: Negative for chest pain, palpitations and leg swelling. +low BP Gastrointestinal: Chronic diffuse ABD pain, waves of nausea - IBS.   Genitourinary: Negative for dysuria, frequency and urgency.  Musculoskeletal: +low back pain Skin: Negative for color change, pallor and rash.  Neurological: Negative for syncope, light-headedness and headaches.  Psychiatric/Behavioral: The patient is not nervous/anxious.    Objective:   Physical Exam  Constitutional: She is oriented to person, place, and time. She appears well-developed and well-nourished. No distress.  HENT:  Head: Normocephalic and atraumatic.  Eyes: Pupils are equal, round, and reactive to light. EOM are normal. No scleral icterus.  Neck: Normal range of motion. Neck supple. No tracheal deviation present.  Cardiovascular: Normal rate, regular rhythm and normal heart sounds.  Pulmonary/Chest: Effort normal and breath sounds normal. No respiratory distress. She has no wheezes. She has no rales.  Abdominal: Soft. Diffuse mild tenderness.  Neurological: She is alert and oriented to person, place, and time.  Gait normal  Musculoskeletal: +low back tenderness lumbar spine, left paraspinals.  Skin: Skin is warm and dry. No pallor.  Psychiatric: She has a normal mood and affect. Her behavior is normal. Thought content normal.  Nursing note and vitals reviewed.  Vitals:   06/18/18 1057  BP: 120/64  Pulse: 65  Resp: 15  Temp: 98.7 F (37.1 C)  SpO2: 98%      Assessment & Plan:    A total of 25  minutes were spent face-to-face with the patient during this encounter and over half of that time was spent on counseling and coordination of care.  The patient was counseled on stopping metoprolol & keeping BP log, discussing changing Bentyl dosing with GI & being sure to remember to eat throughout the day.   Low blood pressure- we will trial discontinuing metoprolol for 2 weeks.  Patient will keep a log of blood pressure readings and return to clinic for follow-up.  Low back pain-patient advised she can use Tylenol as needed for pain control.  Also suggest patient try a topical Biofreeze, Biofreeze is available over-the-counter.  Patient may also continue to use heating pad for back pain relief, and encouraged to do gentle stretching exercises throughout the day.  IBS- patient advised to call GI and discuss if she can use Bentyl more than once a day to better control waves of nausea.  Patient also advised to eat a  bland diet for the next few days to see if this makes a difference.  Patient notes she often forgets to eat and if she is even a small amount of crackers the nausea tends to subside.  Follow-up in 2 weeks for blood pressure recheck after stopping metoprolol.

## 2018-06-18 NOTE — Patient Instructions (Signed)
Great to meet you!  Do not take metoprolol for next 2 weeks, keep log of BP readings

## 2018-06-24 DIAGNOSIS — I129 Hypertensive chronic kidney disease with stage 1 through stage 4 chronic kidney disease, or unspecified chronic kidney disease: Secondary | ICD-10-CM | POA: Diagnosis not present

## 2018-06-24 DIAGNOSIS — N183 Chronic kidney disease, stage 3 (moderate): Secondary | ICD-10-CM | POA: Diagnosis not present

## 2018-06-24 DIAGNOSIS — N390 Urinary tract infection, site not specified: Secondary | ICD-10-CM | POA: Diagnosis not present

## 2018-07-02 ENCOUNTER — Ambulatory Visit (INDEPENDENT_AMBULATORY_CARE_PROVIDER_SITE_OTHER): Payer: Medicare Other | Admitting: Family Medicine

## 2018-07-02 ENCOUNTER — Encounter: Payer: Self-pay | Admitting: Family Medicine

## 2018-07-02 VITALS — BP 100/60 | HR 76 | Temp 98.0°F | Resp 15 | Wt 155.2 lb

## 2018-07-02 DIAGNOSIS — I959 Hypotension, unspecified: Secondary | ICD-10-CM | POA: Diagnosis not present

## 2018-07-02 DIAGNOSIS — R1310 Dysphagia, unspecified: Secondary | ICD-10-CM

## 2018-07-02 DIAGNOSIS — K581 Irritable bowel syndrome with constipation: Secondary | ICD-10-CM | POA: Diagnosis not present

## 2018-07-02 NOTE — Progress Notes (Signed)
Subjective:    Patient ID: Sabrina Montoya, female    DOB: Jul 07, 1941, 77 y.o.   MRN: [id number]  HPI   Patient presents to clinic for follow-up on low blood pressures.  She was advised at last visit to stop metoprolol completely, but she was afraid of doing this.  Patient chose to take metoprolol 12.5 mg just 1 time a day instead of twice.  Blood pressures have been running in the 100s over 60s range.  States she has not had any more episodes of feeling dizzy.  Reports IBS symptoms seem improved.  She saw GI and they recommended she start IBgard which is a peppermint oil supplement.  States since starting this medication abdominal pain almost resolved completely.  Also notes today she has had some left-sided neck soreness/painful swallowing.  States if she does not take a drink with her food almost feels as if she may choke.  Patient has no issues with swallowing water or other liquids.  States if she is sure to chew her food well, swallowing seems to go normally.  Patient states she did not bring the swallowing issue up at her GI appointment.  Patient Active Problem List   Diagnosis Date Noted  . Fatigue 06/07/2018  . Insomnia due to anxiety and fear 04/20/2018  . Chronic bladder pain 12/23/2017  . Candidiasis of skin 12/23/2017  . Irritable bowel syndrome (IBS) 12/23/2017  . Atrophic vaginitis 02/11/2017  . CKD (chronic kidney disease) stage 3, GFR 30-59 ml/min (HCC) 08/24/2016  . Dysphagia, pharyngoesophageal phase 06/20/2016  . Sciatica of left side 06/20/2016  . Type 2 DM with CKD stage 3 and hypertension ([place]) 05/16/2016  . Breast cancer screening 02/06/2016  . Depression with somatization 01/27/2015  . Cervicalgia 01/09/2015  . Cystocele 08/29/2014  . Anemia 05/02/2014  . TIA (transient ischemic attack) 09/11/2013  . Neuropathy 07/01/2013  . Overweight (BMI 25.0-29.9) 03/16/2013  . Chronic reflux esophagitis 12/29/2012  . Right hip pain 12/29/2012  . History of tobacco abuse  07/03/2012  . AAA (abdominal aortic aneurysm) without rupture ([place])   . CAD (coronary artery disease), autologous vein bypass graft   . Peripheral vascular disease ([place])   . Arthritis 02/29/2012  . Asymptomatic carotid artery stenosis 01/05/2012  . Hyperlipidemia   . Osteopenia 08/29/2011  . Vitamin D deficiency 08/29/2011   Social History   Tobacco Use  . Smoking status: Former Smoker    Packs/day: 0.50    Years: 30.00    Pack years: 15.00    Types: Cigarettes    Last attempt to quit: 04/01/2012    Years since quitting: 6.2  . Smokeless tobacco: Never Used  . Tobacco comment: quit for 2 years after sinus infection and 6 months after heart surgery  Substance Use Topics  . Alcohol use: No   Review of Systems   Constitutional: Negative for chills, fatigue and fever.  HENT: Negative for congestion, ear pain, sinus pain. Left sided sore throat, swallowing issue. Eyes: Negative.   Respiratory: Negative for cough, shortness of breath and wheezing.   Cardiovascular: Negative for chest pain, palpitations and leg swelling. BP seems stable.  Gastrointestinal: Negative for abdominal pain, diarrhea, nausea and vomiting. IBS improved.  Genitourinary: Negative for dysuria, frequency and urgency.  Musculoskeletal: Negative for arthralgias and myalgias.  Skin: Negative for color change, pallor and rash.  Neurological: Negative for syncope, light-headedness and headaches.  Psychiatric/Behavioral: The patient is not nervous/anxious.       Objective:   Physical Exam  Constitutional: She is oriented to person, place, and time. She appears well-developed and well-nourished. No distress.  HENT:  Head: Normocephalic and atraumatic.  Mouth/Throat: Oropharynx is clear and moist. No oropharyngeal exudate.  Eyes: EOM are normal. No scleral icterus.  Cardiovascular: Normal rate, regular rhythm and normal heart sounds.  Pulmonary/Chest: Effort normal and breath sounds normal. She has no wheezes.  She has no rales.  Musculoskeletal: Normal range of motion. She exhibits no edema or tenderness.  Neurological: She is alert and oriented to person, place, and time. No cranial nerve deficit.  Skin: Skin is warm and dry. No pallor.  Psychiatric: She has a normal mood and affect. Her behavior is normal. Thought content normal.  Nursing note and vitals reviewed.   Vitals:   07/02/18 1111  BP: 100/60  Pulse: 76  Resp: 15  Temp: 98 F (36.7 C)  SpO2: 96%      Assessment & Plan:   Hypotension --blood pressures are still on the lower end, but improved.  Patient encouraged to follow-up with cardiology as for continued need of metoprolol.  Patient states she has a call planned with cardiologist nurse later today and will review this with them.  IBS- symptoms seem improved with IBgard, she will continue this medication.  Swallowing issues- offered to order barium swallow study to further assess, but patient declines.  States she will be sure to chew food up well and have drinks eating to keep food going down easily.  States she will call GI to discuss with them.  Keep regular follow-up as planned in December 2019.

## 2018-07-02 NOTE — Patient Instructions (Signed)
Continue Metoprolol 1/2 tablet (12.5mg ) at this time - be sure to double check this dose with cardiology also  IBguard is helping IBS symptoms  If you change your mind and want swallowing study ordered - call to let us know

## 2018-07-15 ENCOUNTER — Telehealth: Payer: Self-pay

## 2018-07-15 NOTE — Telephone Encounter (Signed)
Patient declined to schedule Carotid u/s /  Being seen in [place] .  Removing from [place].

## 2018-08-02 ENCOUNTER — Other Ambulatory Visit: Payer: Self-pay | Admitting: Internal Medicine

## 2018-08-05 ENCOUNTER — Other Ambulatory Visit: Payer: Self-pay

## 2018-08-05 ENCOUNTER — Ambulatory Visit: Payer: Medicare Other | Attending: Urology | Admitting: Physical Therapy

## 2018-08-05 VITALS — BP 110/80

## 2018-08-05 DIAGNOSIS — R29898 Other symptoms and signs involving the musculoskeletal system: Secondary | ICD-10-CM | POA: Diagnosis not present

## 2018-08-05 DIAGNOSIS — R278 Other lack of coordination: Secondary | ICD-10-CM

## 2018-08-05 DIAGNOSIS — M6281 Muscle weakness (generalized): Secondary | ICD-10-CM | POA: Diagnosis not present

## 2018-08-05 NOTE — Patient Instructions (Signed)
  Proper body mechanics with getting out of a chair to decrease strain  on back &pelvic floor   Avoid holding your breath when Getting out of the chair:  Scoot to front part of chair chair Heels behind feet, feet are hip width apart, nose over toes  Inhale like you are smelling roses Exhale to stand    ______     Avoid straining pelvic floor, abdominal muscles , spine  Use log rolling technique instead of getting out of bed with your neck or the sit-up   Log rolling into and out of .bed  With sidelying position first    _______   Increase water from (1) 16 fl oz of water per day to ( 2) 16 fl oz water per day . Drink room temperature.   We will discuss coffee and soda and tea next time.

## 2018-08-05 NOTE — Therapy (Addendum)
[place] MAIN [facility] SERVICES [address] [place], [place], [address] Phone: [phone]   Fax:  [phone]  Physical Therapy Evaluation  Patient Details  Name: Sabrina Montoya MRN: [id number] Date of Birth: May 18, 1941 Referring Provider (PT): Hollice Espy   Encounter Date: 08/05/2018    Past Medical History:  Diagnosis Date  . Abdominal aortic aneurysm without mention of rupture    infrarenal, stable, folllowed by Leotis Pain  . Acute posthemorrhagic anemia   . Arthritis   . B12 deficiency   . CAD (coronary artery disease), autologous vein bypass graft   . Cardiac dysrhythmia, unspecified   . Colon polyp   . Depression   . Diverticulitis of colon   . Dizziness    DUE TO MEDICINES  . Dysrhythmia   . GERD (gastroesophageal reflux disease)   . HOH (hard of hearing)   . Hyperlipidemia   . Hypertension    pt denies. placed on meds after CABG  . IBS (irritable bowel syndrome)   . Neuropathy   . Peripheral vascular disease ([place])    s/p CEA   . Reflux esophagitis   . Rheumatic fever    possible at age 34  . Sliding hiatal hernia   . Tobacco abuse   . Tobacco abuse     Past Surgical History:  Procedure Laterality Date  . ABDOMINAL SURGERY  1976   for pain secondary to scar tissue, s/p apply  . APPENDECTOMY  1974  . BACK SURGERY    . CAROTID ENDARTERECTOMY    . CATARACT EXTRACTION W/PHACO Right 10/18/2016   Procedure: CATARACT EXTRACTION PHACO AND INTRAOCULAR LENS PLACEMENT (IOC);  Surgeon: Estill Cotta, MD;  Location: [facility] ORS;  Service: Ophthalmology;  Laterality: Right;  Lot # [id number] H [place]: 01:04.0 AP%: 24.4 CDE: 30.44  . CATARACT EXTRACTION W/PHACO Left 03/14/2017   Procedure: CATARACT EXTRACTION PHACO AND INTRAOCULAR LENS PLACEMENT ([place])  Left;  Surgeon: Leandrew Koyanagi, MD;  Location: [place];  Service: Ophthalmology;  Laterality: Left;  . CEA     Carotid stenosis found during workup for dysphagia,   [facility]  . 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 CHOLECYSTECTOMY  2002   Dr. Pat Patrick  . CLOSED MANIPULATION SHOULDER     left shoulder post physical therapy, Right shoulder redo (toggle bolts)  . COLONOSCOPY  09/01/14  . coronary angiography  01/2011   one occluded artery with collateralization, other arteries patent  . CORONARY ARTERY BYPASS GRAFT  2006   s/p triple bypass surgery, [facility]  . heart catherization  2016  . HERNIA REPAIR  may 2011   Dr. Pat Patrick  . hernia repair    . LUMBAR DISC SURGERY  1971   L5, unssuccessful, fusion in 1983 successful (2 lumbar)  . PARTIAL HYSTERECTOMY    . ROTATOR CUFF REPAIR  2002   right shoulder Dr. Nadean Corwin, [facility] in [place]:   08/05/18 1117  BP: 110/80     Subjective Assessment - 08/05/18 1123    Subjective  1) prolapse and fecal incontinence: During 2013-15, pt was caring her husband and her bladder dropped with running back and forth to [facility] and pushing him in the wheelchair. Her gynecolgist Dr. Sharlett Iles gave her a pessary. After her husband passed away. Pt had a partial hysterectomy to correct the uterus falling onto the bladder. Her fallopian tubes are  still intact. Her bladder tacked  up.  Since this surgery, pt noticed her urine had terrible odor. Pt feels her bladder is pushing against her rectum because when she goes to urinate, she has noticed feces on her toilet paper and also she has noticed gas when she presses to wipe. Pt is no longer wearing a pessary. Pt noticed her bladder dropped again for the past few months. Pt's PCP referred pt to a urologist who advised her to drink more water to address the odor in her urine.  Daily fluid intake: 1 bottle (16 fl oz), 1 cup of coffee,  1 glass of tea, and takes medicine with Sprite.   2) frequent bowel movements:  smoe days she goes 8-9x day. on a normal day, she goes 3 x. Stool Type 1  (30% of the time),  Type 4 ( 25%) , Type 3 ( 45%).  Pt does not have to strain with bowel movements. Pt has had stomach issues in her adult life.  GI disorders: divertiticulitis - her last bout in Oct 2018,  IBS-diarrhea with frequency.   3) CLBP 9/10 occurs randomly, pt points to the SIJ. Non radiating. LImits her from doing household chores and pt has to sit back in a chair. Back surgeries and fusion.           Pertinent History  Kidney Stage 3, CABG, partial hysterectomy  2016,  Vagainal deliveries x 2 with large sized babies ( episiotomies) , appendectomy,  abdominal aorta aneurysm, low abdominal surgery with scar removal surgeries, gall bladder removed . Back surgeries and fusion          [facility] PT Assessment - 08/20/18 1119      Assessment   Medical Diagnosis  pelvic and perineal pain    Referring Provider (PT)  Hollice Espy      Precautions   Precautions  None      Restrictions   Weight Bearing Restrictions  No      Balance Screen   Has the patient fallen in the past 6 months  No      [place] residence      Strength   Overall Strength  --   L hip flex 3/5, R 4+/5, knee ext/flex B 4-/5       Palpation   Spinal mobility Limited thoracic hypomobility, increased paraspinal mm tensions    SI assessment   levelled pelvic girdle    Palpation comment  scar restrictions along lumbar, sternum, below sternum, limited diaphragmatic expansion, pelvic floor ROM.  Plan to assess pelvic floor at upcoming visit   low abdomen.  all quadrants of abdomen ( lower B > upper quadrants)  non soft,      Body mechanics:  breathholding with sit to stand, crunch method to from supine to sitting           Objective measurements completed on examination: See above findings.      [facility] PT Treatment/Exercise - 08/20/18 1120      Therapeutic Activites    Therapeutic Activities  --   see pt instructions     Neuro Re-ed    Neuro Re-ed  Details   see pt instructions                  PT Long Term Goals - 08/05/18 1312      PT LONG TERM GOAL #1   Title  Pt will decrease her PFDI score from  58% to < 30% in order to restore pelvic floor function    Time  12    Period  Weeks    Status  New    Target Date  10/28/18      PT LONG TERM GOAL #2   Title  Pt will decrease her ODI score from 26% to < 16% in order to return to ADLs with less back pain    Time  8    Period  Weeks    Status  New    Target Date  09/30/18      PT LONG TERM GOAL #3   Title  Pt will demo decreased scar restrictions at sternum, upper/lower quadrant of abdomen, and LBP in order to optimize ROM of deep core mm and progress with deep core exercises     Time  4    Period  Weeks    Status  New    Target Date  09/02/18      PT LONG TERM GOAL #4   Title  Pt will demo proper body mechanics in functional activities ( sit to stand, log rolling out of bed, gardening, floor<> stand, household chores ) and fitness exercises in order to minimize worsening of prolapse.     Time  6    Period  Weeks    Status  New    Target Date  09/16/18             Plan - 08/20/18 1121    Clinical Impression Statement  Pt is a 77 yo female who reports of prolapse, fecal incontinence, frequent bowel movements, and CLBP. These deficits impact her QOL and ADLs. Pt's clinical presentations include with limited spinal mobility, tight paraspinal mm, dyscoordination and strength of pelvic floor mm, weak hip weakness, scar restrictions over abdomen, lumbar, sternum from multiple surgeries, and poor body mechanics which places strain on the abdominal/pelvic floor mm. These are deficits that indicate an ineffective intraabdominal pressure system associated with her Sx. Pt was provided education on etiology of Sx with anatomy, physiology explanation with image and explained about benefits of customized pelvic PT Tx based on her medical conditions and musculoskeletal  deficits. Pt has risk factors of OSA with cardiac surgery Hx and poor sleep report and would benefit from getting screened with a sleep study. DPT will communicate with PCP about this concern.         Rehab Potential  Good    PT Frequency  1x / week    PT Duration  12 weeks    PT Treatment/Interventions  Moist Heat;Functional mobility training;Neuromuscular re-education;Gait training;Stair training;Aquatic Therapy;Therapeutic activities;Therapeutic exercise;Balance training;Electrical Stimulation;Scar mobilization;Manual lymph drainage;Manual techniques;Patient/family education;Taping;Energy conservation;Biofeedback;Cryotherapy    Consulted and Agree with Plan of Care  Patient       Patient will benefit from skilled therapeutic intervention in order to improve the following deficits and impairments:  Improper body mechanics, Pain, Increased fascial restricitons, Increased muscle spasms, Decreased mobility, Decreased coordination, Decreased scar mobility, Decreased endurance, Decreased activity tolerance, Decreased range of motion, Decreased strength, Decreased safety awareness, Hypomobility, Postural dysfunction  Visit Diagnosis: Other symptoms and signs involving the musculoskeletal system  Other lack of coordination  Muscle weakness (generalized)     Problem List Patient Active Problem List   Diagnosis Date Noted  . Fatigue 06/07/2018  . Insomnia due to anxiety and fear 04/20/2018  . Chronic bladder pain 12/23/2017  . Candidiasis of skin 12/23/2017  . Irritable bowel syndrome (IBS) 12/23/2017  . Atrophic vaginitis 02/11/2017  .  CKD (chronic kidney disease) stage 3, GFR 30-59 ml/min (HCC) 08/24/2016  . Dysphagia, pharyngoesophageal phase 06/20/2016  . Sciatica of left side 06/20/2016  . Type 2 DM with CKD stage 3 and hypertension ([place]) 05/16/2016  . Breast cancer screening 02/06/2016  . Depression with somatization 01/27/2015  . Cervicalgia 01/09/2015  . Cystocele 08/29/2014   . Anemia 05/02/2014  . TIA (transient ischemic attack) 09/11/2013  . Neuropathy 07/01/2013  . Overweight (BMI 25.0-29.9) 03/16/2013  . Chronic reflux esophagitis 12/29/2012  . Right hip pain 12/29/2012  . History of tobacco abuse 07/03/2012  . AAA (abdominal aortic aneurysm) without rupture ([place])   . CAD (coronary artery disease), autologous vein bypass graft   . Peripheral vascular disease ([place])   . Arthritis 02/29/2012  . Asymptomatic carotid artery stenosis 01/05/2012  . Hyperlipidemia   . Osteopenia 08/29/2011  . Vitamin D deficiency 08/29/2011    Jerl Mina ,PT, DPT, E-RYT  08/20/2018, 11:21 AM  [place] MAIN [facility] SERVICES [address] [place], [place], [address] Phone: [phone]   Fax:  [phone]  Name: Sabrina Montoya MRN: [id number] Date of Birth: 28-Nov-1940

## 2018-08-16 ENCOUNTER — Ambulatory Visit: Payer: Medicare Other | Attending: Urology | Admitting: Physical Therapy

## 2018-08-16 DIAGNOSIS — M6281 Muscle weakness (generalized): Secondary | ICD-10-CM

## 2018-08-16 DIAGNOSIS — R29898 Other symptoms and signs involving the musculoskeletal system: Secondary | ICD-10-CM

## 2018-08-16 DIAGNOSIS — R278 Other lack of coordination: Secondary | ICD-10-CM | POA: Diagnosis not present

## 2018-08-16 DIAGNOSIS — R2689 Other abnormalities of gait and mobility: Secondary | ICD-10-CM | POA: Diagnosis not present

## 2018-08-16 NOTE — Therapy (Signed)
[place] MAIN [facility] SERVICES [address] [place], [place], [address] Phone: [phone]   Fax:  [phone]  Physical Therapy Treatment  Patient Details  Name: Sabrina Montoya MRN: [id number] Date of Birth: [birth year] No data recorded  Encounter Date: 08/16/2018    Past Medical History:  Diagnosis Date  . Abdominal aortic aneurysm without mention of rupture    infrarenal, stable, folllowed by Leotis Pain  . Acute posthemorrhagic anemia   . Arthritis   . B12 deficiency   . CAD (coronary artery disease), autologous vein bypass graft   . Cardiac dysrhythmia, unspecified   . Colon polyp   . Depression   . Diverticulitis of colon   . Dizziness    DUE TO MEDICINES  . Dysrhythmia   . GERD (gastroesophageal reflux disease)   . HOH (hard of hearing)   . Hyperlipidemia   . Hypertension    pt denies. placed on meds after CABG  . IBS (irritable bowel syndrome)   . Neuropathy   . Peripheral vascular disease ([place])    s/p CEA   . Reflux esophagitis   . Rheumatic fever    possible at age 33  . Sliding hiatal hernia   . Tobacco abuse   . Tobacco abuse     Past Surgical History:  Procedure Laterality Date  . ABDOMINAL SURGERY  1976   for pain secondary to scar tissue, s/p apply  . APPENDECTOMY  1974  . BACK SURGERY    . CAROTID ENDARTERECTOMY    . CATARACT EXTRACTION W/PHACO Right 10/18/2016   Procedure: CATARACT EXTRACTION PHACO AND INTRAOCULAR LENS PLACEMENT (IOC);  Surgeon: Estill Cotta, MD;  Location: [facility];  Service: Ophthalmology;  Laterality: Right;  Lot # [id number] H [place]: 01:04.0 AP%: 24.4 CDE: 30.44  . CATARACT EXTRACTION W/PHACO Left 03/14/2017   Procedure: CATARACT EXTRACTION PHACO AND INTRAOCULAR LENS PLACEMENT ([place])  Left;  Surgeon: Leandrew Koyanagi, MD;  Location: [place];  Service: Ophthalmology;  Laterality: Left;  . CEA     Carotid stenosis found during workup for dysphagia,  [facility]  . CERVICAL  DISC SURGERY  2009   Dr. Hoover Brunette for cervical cord stenosis, C5-6 diskectomy  . CHOLECYSTECTOMY  2002   Dr. Pat Patrick  . CLOSED MANIPULATION SHOULDER     left shoulder post physical therapy, Right shoulder redo (toggle bolts)  . COLONOSCOPY  09/01/14  . coronary angiography  01/2011   one occluded artery with collateralization, other arteries patent  . CORONARY ARTERY BYPASS GRAFT  2006   s/p triple bypass surgery, [facility]  . heart catherization  2016  . HERNIA REPAIR  may 2011   Dr. Pat Patrick  . hernia repair    . LUMBAR DISC SURGERY  1971   L5, unssuccessful, fusion in 1983 successful (2 lumbar)  . PARTIAL HYSTERECTOMY    . ROTATOR CUFF REPAIR  2002   right shoulder Dr. Nadean Corwin, [place] in [place]      There were no vitals filed for this visit.  Subjective Assessment - 08/16/18 1112    Subjective  Pt reports she has not been sleeping well the past few days. Pt has not been going to the toilet as often the past week. Pt has increased water intake to 2 bottle a day.  Pt's neck, midback, and back has been bothering her.      Pertinent History  Kidney Stage 3, CABG, partial hysterectomy  2016,  Vagainal deliveries x 2  with large sized babies ( episiotomies) , appendectomy,  abdominal aorta aneurysm, low abdominal surgery with scar removal surgeries, gall bladder removed . Back surgeries and fusion          [facility] PT Assessment - 08/16/18 1322      Coordination   Gross Motor Movements are Fluid and Coordinated  --   limited diaphragmatic excursion ( increased post Tx)      Palpation   Spinal mobility  increased tightenss at thoracic region    Palpation comment  scar restrictions along lumbar, sternum, below sternum, low abdomen.  ( manual Tx decreased restrictions on sternum and L neck                    [facility] Adult PT Treatment/Exercise - 08/16/18 1324      Neuro Re-ed    Neuro Re-ed Details   see pt instructions      Manual Therapy    Manual therapy comments  fascial releases over sternum, neck.  MW and STM at thoracic mm                    PT Long Term Goals - 08/05/18 1312      PT LONG TERM GOAL #1   Title  Pt will decrease her PFDI score from 58% to < 30% in order to restore pelvic floor function    Time  12    Period  Weeks    Status  New    Target Date  10/28/18      PT LONG TERM GOAL #2   Title  Pt will decrease her ODI score from 26% to < 16% in order to return to ADLs with less back pain    Time  8    Period  Weeks    Status  New    Target Date  09/30/18      PT LONG TERM GOAL #3   Title  Pt will demo decreased scar restrictions at sternum, upper/lower quadrant of abdomen, and LBP in order to optimize ROM of deep core mm and progress with deep core exercises     Time  4    Period  Weeks    Status  New    Target Date  09/02/18      PT LONG TERM GOAL #4   Title  Pt will demo proper body mechanics in functional activities ( sit to stand, log rolling out of bed, gardening, floor<> stand, household chores ) and fitness exercises in order to minimize worsening of prolapse.     Time  6    Period  Weeks    Status  New    Target Date  09/16/18            Plan - 08/16/18 1325    Clinical Impression Statement  Pt reported her urinary frequency has decreased and she has been compliant with drinking 2 bottle of water per day. Pt reported she did not sleep well the past couple of nights. Pt declined getting a referral for a sleep study through her PCP at this time. Pt was provided information about sleep hygiene and emphasized the importance of screening of OSA given her increased risk factors with cardiac issues.   Address limited scar mobility at sternum and L neck, limited diaphragmatic excursion, and thoracic mm tightness with gentle manual Tx. Pt demo'd improved deep core coordination following Tx. Pt tolerated manual Tx without complaints. Pt was placed on a incline  to maintain head above  heart level given her cardiac conditions. BP today was monitored 124/ 70 mm Hg in resting position.  Following new HEP which included diaphragmatic breathing and decreasing thoracic tightness, pt reported she felt more relaxed than when she arrived. Pt was provided education on the importance of compliance to spinal mobility and HEP to minimize LBP.   Pt continues to benefit from skilled PT.    Rehab Potential  Good    PT Frequency  1x / week    PT Duration  12 weeks    PT Treatment/Interventions  Moist Heat;Functional mobility training;Neuromuscular re-education;Gait training;Stair training;Aquatic Therapy;Therapeutic activities;Therapeutic exercise;Balance training;Electrical Stimulation;Scar mobilization;Manual lymph drainage;Manual techniques;Patient/family education;Taping;Energy conservation;Biofeedback;Cryotherapy    Consulted and Agree with Plan of Care  Patient       Patient will benefit from skilled therapeutic intervention in order to improve the following deficits and impairments:  Improper body mechanics, Pain, Increased fascial restricitons, Increased muscle spasms, Decreased mobility, Decreased coordination, Decreased scar mobility, Decreased endurance, Decreased activity tolerance, Decreased range of motion, Decreased strength, Decreased safety awareness, Hypomobility, Postural dysfunction  Visit Diagnosis: Other symptoms and signs involving the musculoskeletal system  Other lack of coordination  Muscle weakness (generalized)     Problem List Patient Active Problem List   Diagnosis Date Noted  . Fatigue 06/07/2018  . Insomnia due to anxiety and fear 04/20/2018  . Chronic bladder pain 12/23/2017  . Candidiasis of skin 12/23/2017  . Irritable bowel syndrome (IBS) 12/23/2017  . Atrophic vaginitis 02/11/2017  . CKD (chronic kidney disease) stage 3, GFR 30-59 ml/min (HCC) 08/24/2016  . Dysphagia, pharyngoesophageal phase 06/20/2016  . Sciatica of left side 06/20/2016  .  Type 2 DM with CKD stage 3 and hypertension ([place]) 05/16/2016  . Breast cancer screening 02/06/2016  . Depression with somatization 01/27/2015  . Cervicalgia 01/09/2015  . Cystocele 08/29/2014  . Anemia 05/02/2014  . TIA (transient ischemic attack) 09/11/2013  . Neuropathy 07/01/2013  . Overweight (BMI 25.0-29.9) 03/16/2013  . Chronic reflux esophagitis 12/29/2012  . Right hip pain 12/29/2012  . History of tobacco abuse 07/03/2012  . AAA (abdominal aortic aneurysm) without rupture ([place])   . CAD (coronary artery disease), autologous vein bypass graft   . Peripheral vascular disease ([place])   . Arthritis 02/29/2012  . Asymptomatic carotid artery stenosis 01/05/2012  . Hyperlipidemia   . Osteopenia 08/29/2011  . Vitamin D deficiency 08/29/2011    Jerl Mina ,PT, DPT, E-RYT  08/16/2018, 1:26 PM  [place] MAIN [facility] SERVICES [address] [place], [place], [address] Phone: [phone]   Fax:  [phone]  Name: Sabrina Montoya MRN: [id number] Date of Birth: [birth year]

## 2018-08-16 NOTE — Patient Instructions (Signed)
Open book ( handout)  Deep core 1 and 2 ( handout)

## 2018-08-20 ENCOUNTER — Telehealth: Payer: Self-pay

## 2018-08-20 NOTE — Telephone Encounter (Signed)
Yes she does

## 2018-08-20 NOTE — Telephone Encounter (Signed)
Does pt need to schedule appt to discuss sleep study before a referral can be placed?

## 2018-08-20 NOTE — Telephone Encounter (Signed)
Copied from [place] [id number]. Topic: Quick Communication - See Telephone Encounter >> Aug 20, 2018 11:13 AM Antonieta Iba C wrote: CRM for notification. See Telephone encounter for: 08/20/18.  Dr Annamaria Boots PT- [phone]   Pt was referred to her. Pt has prolap symptoms. Pt has had cardiac surgery and also notaria which are risk factors for sleep apnea. She feels that pt would benefit from a sleep study due to pt reporting poor sleep quality and feels tired.

## 2018-08-20 NOTE — Addendum Note (Signed)
Addended by: Jerl Mina on: 08/20/2018 11:32 AM   Modules accepted: Orders

## 2018-08-21 NOTE — Telephone Encounter (Signed)
Spoke with pt and she stated that she does not want to do the sleep study right now. She stated that she is still having issues with her stomach.

## 2018-08-23 ENCOUNTER — Ambulatory Visit: Payer: Medicare Other | Admitting: Physical Therapy

## 2018-08-23 DIAGNOSIS — R278 Other lack of coordination: Secondary | ICD-10-CM | POA: Diagnosis not present

## 2018-08-23 DIAGNOSIS — R2689 Other abnormalities of gait and mobility: Secondary | ICD-10-CM | POA: Diagnosis not present

## 2018-08-23 DIAGNOSIS — M6281 Muscle weakness (generalized): Secondary | ICD-10-CM | POA: Diagnosis not present

## 2018-08-23 DIAGNOSIS — R29898 Other symptoms and signs involving the musculoskeletal system: Secondary | ICD-10-CM

## 2018-08-23 NOTE — Therapy (Signed)
[place] MAIN [facility] SERVICES [address] [place], [place], [address] Phone: [phone]   Fax:  [phone]  Physical Therapy Treatment  Patient Details  Name: Sabrina Montoya MRN: [id number] Date of Birth: [birth year] Referring Provider (PT): Hollice Espy   Encounter Date: 08/23/2018  PT End of Session - 08/23/18 2018    Visit Number  3    Number of Visits  12    PT Start Time  8546    PT Stop Time  1148    PT Time Calculation (min)  33 min       Past Medical History:  Diagnosis Date  . Abdominal aortic aneurysm without mention of rupture    infrarenal, stable, folllowed by Leotis Pain  . Acute posthemorrhagic anemia   . Arthritis   . B12 deficiency   . CAD (coronary artery disease), autologous vein bypass graft   . Cardiac dysrhythmia, unspecified   . Colon polyp   . Depression   . Diverticulitis of colon   . Dizziness    DUE TO MEDICINES  . Dysrhythmia   . GERD (gastroesophageal reflux disease)   . HOH (hard of hearing)   . Hyperlipidemia   . Hypertension    pt denies. placed on meds after CABG  . IBS (irritable bowel syndrome)   . Neuropathy   . Peripheral vascular disease ([place])    s/p CEA   . Reflux esophagitis   . Rheumatic fever    possible at age 89  . Sliding hiatal hernia   . Tobacco abuse   . Tobacco abuse     Past Surgical History:  Procedure Laterality Date  . ABDOMINAL SURGERY  1976   for pain secondary to scar tissue, s/p apply  . APPENDECTOMY  1974  . BACK SURGERY    . CAROTID ENDARTERECTOMY    . CATARACT EXTRACTION W/PHACO Right 10/18/2016   Procedure: CATARACT EXTRACTION PHACO AND INTRAOCULAR LENS PLACEMENT (IOC);  Surgeon: Estill Cotta, MD;  Location: [facility];  Service: Ophthalmology;  Laterality: Right;  Lot # [id number] H [place]: 01:04.0 AP%: 24.4 CDE: 30.44  . CATARACT EXTRACTION W/PHACO Left 03/14/2017   Procedure: CATARACT EXTRACTION PHACO AND INTRAOCULAR LENS PLACEMENT ([place])  Left;  Surgeon:  Leandrew Koyanagi, MD;  Location: [place];  Service: Ophthalmology;  Laterality: Left;  . CEA     Carotid stenosis found during workup for dysphagia,  [facility]  . 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 CHOLECYSTECTOMY  2002   Dr. Pat Patrick  . CLOSED MANIPULATION SHOULDER     left shoulder post physical therapy, Right shoulder redo (toggle bolts)  . COLONOSCOPY  09/01/14  . coronary angiography  01/2011   one occluded artery with collateralization, other arteries patent  . CORONARY ARTERY BYPASS GRAFT  2006   s/p triple bypass surgery, [facility]  . heart catherization  2016  . HERNIA REPAIR  may 2011   Dr. Pat Patrick  . hernia repair    . LUMBAR DISC SURGERY  1971   L5, unssuccessful, fusion in 1983 successful (2 lumbar)  . PARTIAL HYSTERECTOMY    . ROTATOR CUFF REPAIR  2002   right shoulder Dr. Nadean Corwin, [place] in [place]      There were no vitals filed for this visit.  Subjective Assessment - 08/23/18 1959    Subjective  Pt reported she noticed when she paused with her breathing she  felt what she noticed when she had her cardiac issues.     Pertinent History  Kidney Stage 3, CABG, partial hysterectomy  2016,  Vagainal deliveries x 2 with large sized babies ( episiotomies) , appendectomy,  abdominal aorta aneurysm, low abdominal surgery with scar removal surgeries, gall bladder removed . Back surgeries and fusion          [facility] PT Assessment - 08/23/18 2002      Squat   Comments  L ankle pronation ( improved with cues )                   [facility] Adult PT Treatment/Exercise - 08/23/18 2002      Therapeutic Activites    Therapeutic Activities  --   see pt instructions     Neuro Re-ed    Neuro Re-ed Details   see pt instructions                  PT Long Term Goals - 08/05/18 1312      PT LONG TERM GOAL #1   Title  Pt will decrease her PFDI score from 58%  to < 30% in order to restore pelvic floor function    Time  12    Period  Weeks    Status  New    Target Date  10/28/18      PT LONG TERM GOAL #2   Title  Pt will decrease her ODI score from 26% to < 16% in order to return to ADLs with less back pain    Time  8    Period  Weeks    Status  New    Target Date  09/30/18      PT LONG TERM GOAL #3   Title  Pt will demo decreased scar restrictions at sternum, upper/lower quadrant of abdomen, and LBP in order to optimize ROM of deep core mm and progress with deep core exercises     Time  4    Period  Weeks    Status  New    Target Date  09/02/18      PT LONG TERM GOAL #4   Title  Pt will demo proper body mechanics in functional activities ( sit to stand, log rolling out of bed, gardening, floor<> stand, household chores ) and fitness exercises in order to minimize worsening of prolapse.     Time  6    Period  Weeks    Status  New    Target Date  09/16/18            Plan - 08/23/18 2005    Clinical Impression Statement  Pt demo'd less ankle pronation on L after Tx. Initiated hip abduction strengthening, thoracic extension in HEP. Refined breathing technique to eliminate pausing to minimize chest breathing.      Rehab Potential  Good    PT Frequency  1x / week    PT Duration  12 weeks    PT Treatment/Interventions  Moist Heat;Functional mobility training;Neuromuscular re-education;Gait training;Stair training;Aquatic Therapy;Therapeutic activities;Therapeutic exercise;Balance training;Electrical Stimulation;Scar mobilization;Manual lymph drainage;Manual techniques;Patient/family education;Taping;Energy conservation;Biofeedback;Cryotherapy    Consulted and Agree with Plan of Care  Patient       Patient will benefit from skilled therapeutic intervention in order to improve the following deficits and impairments:  Improper body mechanics, Pain, Increased fascial restricitons, Increased muscle spasms, Decreased mobility, Decreased  coordination, Decreased scar mobility, Decreased endurance, Decreased activity tolerance, Decreased range of motion, Decreased strength, Decreased  safety awareness, Hypomobility, Postural dysfunction  Visit Diagnosis: Other symptoms and signs involving the musculoskeletal system  Other lack of coordination  Muscle weakness (generalized)     Problem List Patient Active Problem List   Diagnosis Date Noted  . Fatigue 06/07/2018  . Insomnia due to anxiety and fear 04/20/2018  . Chronic bladder pain 12/23/2017  . Candidiasis of skin 12/23/2017  . Irritable bowel syndrome (IBS) 12/23/2017  . Atrophic vaginitis 02/11/2017  . CKD (chronic kidney disease) stage 3, GFR 30-59 ml/min (HCC) 08/24/2016  . Dysphagia, pharyngoesophageal phase 06/20/2016  . Sciatica of left side 06/20/2016  . Type 2 DM with CKD stage 3 and hypertension ([place]) 05/16/2016  . Breast cancer screening 02/06/2016  . Depression with somatization 01/27/2015  . Cervicalgia 01/09/2015  . Cystocele 08/29/2014  . Anemia 05/02/2014  . TIA (transient ischemic attack) 09/11/2013  . Neuropathy 07/01/2013  . Overweight (BMI 25.0-29.9) 03/16/2013  . Chronic reflux esophagitis 12/29/2012  . Right hip pain 12/29/2012  . History of tobacco abuse 07/03/2012  . AAA (abdominal aortic aneurysm) without rupture ([place])   . CAD (coronary artery disease), autologous vein bypass graft   . Peripheral vascular disease ([place])   . Arthritis 02/29/2012  . Asymptomatic carotid artery stenosis 01/05/2012  . Hyperlipidemia   . Osteopenia 08/29/2011  . Vitamin D deficiency 08/29/2011    Jerl Mina 08/23/2018, 8:19 PM  [place] MAIN [facility] SERVICES [address] [place], [place], [address] Phone: [phone]   Fax:  [phone]  Name: Sabrina Montoya MRN: [id number] Date of Birth: January 22, 1941

## 2018-08-23 NOTE — Patient Instructions (Addendum)
midback stretch 5 x day  Seated cat cow, elbow by side, chin tucked,  Less shoulder up  30 x ____   Blue band at thigh  hand push side of chair Inhale soft Exhale open knee against band  30x   _____ Mini squat w/  Band  Butt back, knees behind toes, try to swipe weight to outer foot to L to not drop arch  ______ Ankle strengthening on L with band band wrapped around outer L side of foot ballmound of L foot pressing onto band , R foot is placed on top of band hip width apart, with the ballmound ,  R hand holds the band 15 reps swinging L pinky toe out to the L  X 2x day  ________ Breathing soft sound Avoid the pause

## 2018-08-27 DIAGNOSIS — Z85828 Personal history of other malignant neoplasm of skin: Secondary | ICD-10-CM | POA: Diagnosis not present

## 2018-08-27 DIAGNOSIS — L570 Actinic keratosis: Secondary | ICD-10-CM | POA: Diagnosis not present

## 2018-08-27 DIAGNOSIS — L718 Other rosacea: Secondary | ICD-10-CM | POA: Diagnosis not present

## 2018-08-27 DIAGNOSIS — L304 Erythema intertrigo: Secondary | ICD-10-CM | POA: Diagnosis not present

## 2018-08-27 DIAGNOSIS — L821 Other seborrheic keratosis: Secondary | ICD-10-CM | POA: Diagnosis not present

## 2018-08-27 DIAGNOSIS — C44519 Basal cell carcinoma of skin of other part of trunk: Secondary | ICD-10-CM | POA: Diagnosis not present

## 2018-08-27 DIAGNOSIS — Z23 Encounter for immunization: Secondary | ICD-10-CM | POA: Diagnosis not present

## 2018-08-27 DIAGNOSIS — L218 Other seborrheic dermatitis: Secondary | ICD-10-CM | POA: Diagnosis not present

## 2018-08-28 ENCOUNTER — Ambulatory Visit: Payer: Medicare Other | Admitting: Physical Therapy

## 2018-08-28 DIAGNOSIS — R2689 Other abnormalities of gait and mobility: Secondary | ICD-10-CM | POA: Diagnosis not present

## 2018-08-28 DIAGNOSIS — M6281 Muscle weakness (generalized): Secondary | ICD-10-CM | POA: Diagnosis not present

## 2018-08-28 DIAGNOSIS — R278 Other lack of coordination: Secondary | ICD-10-CM | POA: Diagnosis not present

## 2018-08-28 DIAGNOSIS — R29898 Other symptoms and signs involving the musculoskeletal system: Secondary | ICD-10-CM

## 2018-08-28 NOTE — Patient Instructions (Signed)
Withheld seated resistance band exercise:  Replaced with clams shells 20 reps L  ___  Mini squat with knees behind toes

## 2018-08-28 NOTE — Therapy (Signed)
[place] MAIN [facility] SERVICES [address] [place], [place], [address] Phone: [phone]   Fax:  [phone]  Physical Therapy Treatment  Patient Details  Name: Sabrina Montoya MRN: [id number] Date of Birth: 1941-01-09 Referring Provider (PT): Hollice Espy   Encounter Date: 08/28/2018  PT End of Session - 08/28/18 2212    Visit Number  4    Number of Visits  12    PT Start Time  1400    PT Stop Time  1458    PT Time Calculation (min)  58 min    Activity Tolerance  Patient tolerated treatment well    Behavior During Therapy  [facility] for tasks assessed/performed       Past Medical History:  Diagnosis Date  . Abdominal aortic aneurysm without mention of rupture    infrarenal, stable, folllowed by Leotis Pain  . Acute posthemorrhagic anemia   . Arthritis   . B12 deficiency   . CAD (coronary artery disease), autologous vein bypass graft   . Cardiac dysrhythmia, unspecified   . Colon polyp   . Depression   . Diverticulitis of colon   . Dizziness    DUE TO MEDICINES  . Dysrhythmia   . GERD (gastroesophageal reflux disease)   . HOH (hard of hearing)   . Hyperlipidemia   . Hypertension    pt denies. placed on meds after CABG  . IBS (irritable bowel syndrome)   . Neuropathy   . Peripheral vascular disease ([place])    s/p CEA   . Reflux esophagitis   . Rheumatic fever    possible at age 75  . Sliding hiatal hernia   . Tobacco abuse   . Tobacco abuse     Past Surgical History:  Procedure Laterality Date  . ABDOMINAL SURGERY  1976   for pain secondary to scar tissue, s/p apply  . APPENDECTOMY  1974  . BACK SURGERY    . CAROTID ENDARTERECTOMY    . CATARACT EXTRACTION W/PHACO Right 10/18/2016   Procedure: CATARACT EXTRACTION PHACO AND INTRAOCULAR LENS PLACEMENT (IOC);  Surgeon: Estill Cotta, MD;  Location: [facility] ORS;  Service: Ophthalmology;  Laterality: Right;  Lot # [id number] H [place]: 01:04.0 AP%: 24.4 CDE: 30.44  . CATARACT  EXTRACTION W/PHACO Left 03/14/2017   Procedure: CATARACT EXTRACTION PHACO AND INTRAOCULAR LENS PLACEMENT ([place])  Left;  Surgeon: Leandrew Koyanagi, MD;  Location: [place];  Service: Ophthalmology;  Laterality: Left;  . CEA     Carotid stenosis found during workup for dysphagia,  [place]  . 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 CHOLECYSTECTOMY  2002   Dr. Pat Patrick  . CLOSED MANIPULATION SHOULDER     left shoulder post physical therapy, Right shoulder redo (toggle bolts)  . COLONOSCOPY  09/01/14  . coronary angiography  01/2011   one occluded artery with collateralization, other arteries patent  . CORONARY ARTERY BYPASS GRAFT  2006   s/p triple bypass surgery, [facility]  . heart catherization  2016  . HERNIA REPAIR  may 2011   Dr. Pat Patrick  . hernia repair    . LUMBAR DISC SURGERY  1971   L5, unssuccessful, fusion in 1983 successful (2 lumbar)  . PARTIAL HYSTERECTOMY    . ROTATOR CUFF REPAIR  2002   right shoulder Dr. Nadean Corwin, [place] in [place]      There were no vitals filed for this visit.  Subjective Assessment - 08/28/18 1409    Subjective  Pt reported she tried do the band exercise but when she is trying to pull the L leg, it is weaker and the R leg does more the work.  Pt had pain at neck to top of L shoulder.  Pt has had rotator cuff x 2 on R arm.     Pertinent History  Kidney Stage 3, CABG, partial hysterectomy  2016,  Vagainal deliveries x 2 with large sized babies ( episiotomies) , appendectomy,  abdominal aorta aneurysm, low abdominal surgery with scar removal surgeries, gall bladder removed . Back surgeries and fusion                        [facility] Adult PT Treatment/Exercise - 08/28/18 2140      Neuro Re-ed    Neuro Re-ed Details   reviewed squats, withheld sitting abduction with resistance band, replaced with clams        Manual Therapy   Manual therapy  comments  lumbar scar release, distraction along paraspinals B                   PT Long Term Goals - 08/05/18 1312      PT LONG TERM GOAL #1   Title  Pt will decrease her PFDI score from 58% to < 30% in order to restore pelvic floor function    Time  12    Period  Weeks    Status  New    Target Date  10/28/18      PT LONG TERM GOAL #2   Title  Pt will decrease her ODI score from 26% to < 16% in order to return to ADLs with less back pain    Time  8    Period  Weeks    Status  New    Target Date  09/30/18      PT LONG TERM GOAL #3   Title  Pt will demo decreased scar restrictions at sternum, upper/lower quadrant of abdomen, and LBP in order to optimize ROM of deep core mm and progress with deep core exercises     Time  4    Period  Weeks    Status  New    Target Date  09/02/18      PT LONG TERM GOAL #4   Title  Pt will demo proper body mechanics in functional activities ( sit to stand, log rolling out of bed, gardening, floor<> stand, household chores ) and fitness exercises in order to minimize worsening of prolapse.     Time  6    Period  Weeks    Status  New    Target Date  09/16/18            Plan - 08/28/18 2212    Clinical Impression Statement  Addressed lumbar scar restrictions, thoracic spine hypomobility with manual Tx. Pt demo'd improved mobility post Tx. Replaced seated resistance band hip abduction on L with clam shell. Reviewed proper mini squat. Pt continues to benefit from skilled PT.     Rehab Potential  Good    PT Frequency  1x / week    PT Duration  12 weeks    PT Treatment/Interventions  Moist Heat;Functional mobility training;Neuromuscular re-education;Gait training;Stair training;Aquatic Therapy;Therapeutic activities;Therapeutic exercise;Balance training;Electrical Stimulation;Scar mobilization;Manual lymph drainage;Manual techniques;Patient/family education;Taping;Energy conservation;Biofeedback;Cryotherapy    Consulted and Agree with  Plan of Care  Patient  Patient will benefit from skilled therapeutic intervention in order to improve the following deficits and impairments:  Improper body mechanics, Pain, Increased fascial restricitons, Increased muscle spasms, Decreased mobility, Decreased coordination, Decreased scar mobility, Decreased endurance, Decreased activity tolerance, Decreased range of motion, Decreased strength, Decreased safety awareness, Hypomobility, Postural dysfunction  Visit Diagnosis: Other symptoms and signs involving the musculoskeletal system  Other lack of coordination  Muscle weakness (generalized)     Problem List Patient Active Problem List   Diagnosis Date Noted  . Fatigue 06/07/2018  . Insomnia due to anxiety and fear 04/20/2018  . Chronic bladder pain 12/23/2017  . Candidiasis of skin 12/23/2017  . Irritable bowel syndrome (IBS) 12/23/2017  . Atrophic vaginitis 02/11/2017  . CKD (chronic kidney disease) stage 3, GFR 30-59 ml/min (HCC) 08/24/2016  . Dysphagia, pharyngoesophageal phase 06/20/2016  . Sciatica of left side 06/20/2016  . Type 2 DM with CKD stage 3 and hypertension ([place]) 05/16/2016  . Breast cancer screening 02/06/2016  . Depression with somatization 01/27/2015  . Cervicalgia 01/09/2015  . Cystocele 08/29/2014  . Anemia 05/02/2014  . TIA (transient ischemic attack) 09/11/2013  . Neuropathy 07/01/2013  . Overweight (BMI 25.0-29.9) 03/16/2013  . Chronic reflux esophagitis 12/29/2012  . Right hip pain 12/29/2012  . History of tobacco abuse 07/03/2012  . AAA (abdominal aortic aneurysm) without rupture ([place])   . CAD (coronary artery disease), autologous vein bypass graft   . Peripheral vascular disease ([place])   . Arthritis 02/29/2012  . Asymptomatic carotid artery stenosis 01/05/2012  . Hyperlipidemia   . Osteopenia 08/29/2011  . Vitamin D deficiency 08/29/2011    Jerl Mina ,PT, DPT, E-RYT  08/28/2018, 10:14 PM  [place] MAIN [facility] SERVICES [address] [place], [place], [address] Phone: [phone]   Fax:  [phone]  Name: DI JASMER MRN: [id number] Date of Birth: May 25, 1941

## 2018-08-29 ENCOUNTER — Telehealth: Payer: Self-pay

## 2018-08-29 NOTE — Telephone Encounter (Signed)
Pt came in to the office to see if someone would check the bandage on her back. The pt had a place removed by dermatology Tuesday and she had to re apply the bandaid by herself this morning and wasn't sure she got it in the correct place. Spoke with Dr. Derrel Nip and she stated that it was okay to replace bandaid over wound. Bandaid was replaced. Wound was not draining or red.   Vitals BP: 114/66 P: 90 T: 98.5 O2: 96% R: 15

## 2018-08-29 NOTE — Telephone Encounter (Signed)
  I have reviewed the above information and agree with above.   Braeden Kennan, MD 

## 2018-09-06 ENCOUNTER — Ambulatory Visit (INDEPENDENT_AMBULATORY_CARE_PROVIDER_SITE_OTHER): Payer: Medicare Other | Admitting: *Deleted

## 2018-09-06 DIAGNOSIS — Z23 Encounter for immunization: Secondary | ICD-10-CM | POA: Diagnosis not present

## 2018-09-13 ENCOUNTER — Encounter: Payer: Medicare Other | Admitting: Physical Therapy

## 2018-09-13 ENCOUNTER — Other Ambulatory Visit (INDEPENDENT_AMBULATORY_CARE_PROVIDER_SITE_OTHER): Payer: Self-pay | Admitting: Vascular Surgery

## 2018-09-13 ENCOUNTER — Encounter (INDEPENDENT_AMBULATORY_CARE_PROVIDER_SITE_OTHER): Payer: Self-pay | Admitting: Vascular Surgery

## 2018-09-13 ENCOUNTER — Ambulatory Visit (INDEPENDENT_AMBULATORY_CARE_PROVIDER_SITE_OTHER): Payer: Medicare Other

## 2018-09-13 ENCOUNTER — Other Ambulatory Visit (INDEPENDENT_AMBULATORY_CARE_PROVIDER_SITE_OTHER): Payer: Medicare Other

## 2018-09-13 ENCOUNTER — Ambulatory Visit (INDEPENDENT_AMBULATORY_CARE_PROVIDER_SITE_OTHER): Payer: Medicare Other | Admitting: Vascular Surgery

## 2018-09-13 VITALS — BP 121/66 | HR 80 | Resp 17 | Ht 64.0 in | Wt 155.0 lb

## 2018-09-13 DIAGNOSIS — I714 Abdominal aortic aneurysm, without rupture, unspecified: Secondary | ICD-10-CM

## 2018-09-13 DIAGNOSIS — I739 Peripheral vascular disease, unspecified: Secondary | ICD-10-CM

## 2018-09-13 DIAGNOSIS — Z87891 Personal history of nicotine dependence: Secondary | ICD-10-CM

## 2018-09-13 DIAGNOSIS — E7800 Pure hypercholesterolemia, unspecified: Secondary | ICD-10-CM

## 2018-09-13 DIAGNOSIS — I10 Essential (primary) hypertension: Secondary | ICD-10-CM | POA: Diagnosis not present

## 2018-09-13 NOTE — Progress Notes (Signed)
MRN : [id number]  Sabrina Montoya is a 77 y.o. (1941-01-30) female who presents with chief complaint of  Chief Complaint  Patient presents with  . Follow-up    ABI and AAA u/s f/u  .  History of Present Illness: Patient returns today in follow up of her aneurysm.  She complains of numbness in her toes and some pain in her legs with activity.  She does not have any ulceration or infection.  Her ABIs today are perfectly normal at 1.1 bilaterally.  Her aortic duplex shows a little smaller diameter of her aorta than at her last study now only measuring approximately 3.3 cm in maximal diameter.  This had been over 3-1/2 previously.    Current Outpatient Medications  Medication Sig Dispense Refill  . aspirin 81 MG tablet Take 81 mg by mouth daily.    Marland Kitchen buPROPion (WELLBUTRIN XL) 150 MG 24 hr tablet TAKE 1 TABLET BY MOUTH ONCE EVERY MORNING 90 tablet 1  . cholecalciferol (VITAMIN D) 1000 UNITS tablet Take 1,000 Units by mouth daily.    Marland Kitchen conjugated estrogens (PREMARIN) vaginal cream Place 1 Applicatorful vaginally daily. Use pea sized amount M-W-Fr before bedtime 42.5 g 12  . dicyclomine (BENTYL) 20 MG tablet Take 1 tablet (20 mg total) by mouth 2 (two) times daily. 60 tablet 3  . isosorbide mononitrate (IMDUR) 30 MG 24 hr tablet Take 30 mg by mouth daily.    Marland Kitchen ketoconazole (NIZORAL) 2 % cream     . metoprolol tartrate (LOPRESSOR) 25 MG tablet TAKE 1/2 TABLET BY MOUTH TWICE DAILY (Patient taking differently: No sig reported) 30 tablet 3  . metroNIDAZOLE (METROGEL) 0.75 % gel     . Multiple Vitamins-Minerals (PRESERVISION AREDS 2 PO) Take by mouth 2 (two) times daily.    Marland Kitchen omeprazole (PRILOSEC) 20 MG capsule TAKE 1 CAPSULE BY MOUTH ONCE DAILY 90 capsule 1  . Peppermint Oil (IBGARD PO) Take by mouth daily.    . rosuvastatin (CRESTOR) 40 MG tablet Take 1 tablet (40 mg total) by mouth daily. 90 tablet 0  . triamcinolone cream (KENALOG) 0.1 %      No current facility-administered medications for this  visit.     Past Medical History:  Diagnosis Date  . Abdominal aortic aneurysm without mention of rupture    infrarenal, stable, folllowed by Leotis Pain  . Acute posthemorrhagic anemia   . Arthritis   . B12 deficiency   . CAD (coronary artery disease), autologous vein bypass graft   . Cardiac dysrhythmia, unspecified   . Colon polyp   . Depression   . Diverticulitis of colon   . Dizziness    DUE TO MEDICINES  . Dysrhythmia   . GERD (gastroesophageal reflux disease)   . HOH (hard of hearing)   . Hyperlipidemia   . Hypertension    pt denies. placed on meds after CABG  . IBS (irritable bowel syndrome)   . Neuropathy   . Peripheral vascular disease ([place])    s/p CEA   . Reflux esophagitis   . Rheumatic fever    possible at age 48  . Sliding hiatal hernia   . Tobacco abuse   . Tobacco abuse     Past Surgical History:  Procedure Laterality Date  . ABDOMINAL SURGERY  1976   for pain secondary to scar tissue, s/p apply  . APPENDECTOMY  1974  . BACK SURGERY    . CAROTID ENDARTERECTOMY    . CATARACT EXTRACTION W/PHACO Right 10/18/2016  Procedure: CATARACT EXTRACTION PHACO AND INTRAOCULAR LENS PLACEMENT (IOC);  Surgeon: Estill Cotta, MD;  Location: [facility] ORS;  Service: Ophthalmology;  Laterality: Right;  Lot # [id number] H [place]: 01:04.0 AP%: 24.4 CDE: 30.44  . CATARACT EXTRACTION W/PHACO Left 03/14/2017   Procedure: CATARACT EXTRACTION PHACO AND INTRAOCULAR LENS PLACEMENT ([place])  Left;  Surgeon: Leandrew Koyanagi, MD;  Location: [place];  Service: Ophthalmology;  Laterality: Left;  . CEA     Carotid stenosis found during work[facility] for dysphagia,  [facility]  . 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 CHOLECYSTECTOMY  2002   Dr. Pat Patrick  . CLOSED MANIPULATION SHOULDER     left shoulder post physical therapy, Right shoulder redo (toggle bolts)  . COLONOSCOPY  09/01/14  . coronary angiography  01/2011   one occluded artery  with collateralization, other arteries patent  . CORONARY ARTERY BYPASS GRAFT  2006   s/p triple bypass surgery, [facility]  . heart catherization  2016  . HERNIA REPAIR  may 2011   Dr. Pat Patrick  . hernia repair    . LUMBAR DISC SURGERY  1971   L5, unssuccessful, fusion in 1983 successful (2 lumbar)  . PARTIAL HYSTERECTOMY    . ROTATOR CUFF REPAIR  2002   right shoulder Dr. Nadean Corwin, [place] in [place] History   Tobacco Use  . Smoking status: Former Smoker    Packs/day: 0.50    Years: 30.00    Pack years: 15.00    Types: Cigarettes    Last attempt to quit: 04/01/2012    Years since quitting: 6.4  . Smokeless tobacco: Never Used  . Tobacco comment: quit for 2 years after sinus infection and 6 months after heart surgery  Substance Use Topics  . Alcohol use: No  . Drug use: No     Family History Family History  Problem Relation Age of Onset  . Cancer Sister        cervical cancer  . Diabetes Sister   . Heart disease Brother        coronary artery disease  . Other Brother        suicide  . Diabetes Mother   . Heart disease Mother   . Diabetes Sister   . Other Father        suicide  . Cancer Sister        breast  . Other Other        colon resection due to inflammation -nephew  . Bladder Cancer Neg Hx   . Kidney cancer Neg Hx      Allergies  Allergen Reactions  . Niacin And Related Hives  . Sertraline Hcl Other (See Comments)    Hallucinations,    . Sulfa Drugs Cross Reactors Nausea Only  . Tetracyclines & Related Hives  . Latex Rash    RAST testing was NEGATIVE  for LATEX  . Simvastatin Rash    *Antihyperlipidemics*; elevated LFT's.      REVIEW OF SYSTEMS (Negative unless checked)  Constitutional: [] Weight loss  [] Fever  [] Chills Cardiac: [] Chest pain   [] Chest pressure   [] Palpitations   [] Shortness of breath when laying flat   [] Shortness of breath at rest   [x] Shortness of breath with  exertion. Vascular:  [] Pain in legs with walking   [] Pain in legs at rest   [] Pain in legs when laying flat   []   Claudication   [] Pain in feet when walking  [] Pain in feet at rest  [] Pain in feet when laying flat   [] History of DVT   [] Phlebitis   [] Swelling in legs   [] Varicose veins   [] Non-healing ulcers Pulmonary:   [] Uses home oxygen   [] Productive cough   [] Hemoptysis   [] Wheeze  [x] COPD   [] Asthma Neurologic:  [] Dizziness  [] Blackouts   [] Seizures   [] History of stroke   [] History of TIA  [] Aphasia   [] Temporary blindness   [] Dysphagia   [] Weakness or numbness in arms   [x] Weakness or numbness in legs Musculoskeletal:  [x] Arthritis   [] Joint swelling   [] Joint pain   [] Low back pain Hematologic:  [] Easy bruising  [] Easy bleeding   [] Hypercoagulable state   [] Anemic   Gastrointestinal:  [] Blood in stool   [] Vomiting blood  [] Gastroesophageal reflux/heartburn   [] Abdominal pain Genitourinary:  [] Chronic kidney disease   [] Difficult urination  [] Frequent urination  [] Burning with urination   [] Hematuria Skin:  [] Rashes   [] Ulcers   [] Wounds Psychological:  [] History of anxiety   []  History of major depression.  Physical Examination  BP 121/66 (BP Location: Right Arm, Patient Position: Sitting)   Pulse 80   Resp 17   Ht 5\' 4"  (1.626 m)   Wt 155 lb (70.3 kg)   BMI 26.61 kg/m  Gen:  WD/WN, NAD Head: [place]/AT, No temporalis wasting. Ear/Nose/Throat: Hearing grossly intact, nares w/o erythema or drainage Eyes: Conjunctiva clear. Sclera non-icteric Neck: Supple.  Trachea midline Pulmonary:  Good air movement, no use of accessory muscles.  Cardiac: RRR, no JVD Vascular: Scattered varicosities Vessel Right Left  Radial Palpable Palpable                          PT Palpable Palpable  DP Palpable Palpable   Gastrointestinal: soft, non-tender/non-distended. No guarding/reflex.  Musculoskeletal: M/S 5/5 throughout.  No deformity or atrophy.  No edema. Neurologic: Sensation grossly intact  in extremities.  Symmetrical.  Speech is fluent.  Psychiatric: Judgment intact, Mood & affect appropriate for pt's clinical situation. Dermatologic: No rashes or ulcers noted.  No cellulitis or open wounds.       Labs No results found for this or any previous visit (from the past 2160 hour(s)).  Radiology No results found.  Assessment/Plan  Hypertension blood pressure control important in reducing the progression of atherosclerotic disease and aneurysmal growth. On appropriate oral medications.   Hyperlipidemia lipid control important in reducing the progression of atherosclerotic disease. Continue statin therapy   AAA (abdominal aortic aneurysm) without rupture (HCC) Her ABIs today are perfectly normal at 1.1 bilaterally.  Her aortic duplex shows a little smaller diameter of her aorta than at her last study now only measuring approximately 3.3 cm in maximal diameter.  This had been over 3-1/2 previously.   No role for intervention at this level.   Recheck in one year.    Leotis Pain, MD  09/13/2018 10:37 AM    This note was created with Dragon medical transcription system.  Any errors from dictation are purely unintentional

## 2018-09-13 NOTE — Assessment & Plan Note (Signed)
Her ABIs today are perfectly normal at 1.1 bilaterally.  Her aortic duplex shows a little smaller diameter of her aorta than at her last study now only measuring approximately 3.3 cm in maximal diameter.  This had been over 3-1/2 previously.   No role for intervention at this level.   Recheck in one year.

## 2018-09-13 NOTE — Patient Instructions (Signed)
Abdominal Aortic Aneurysm Blood pumps away from the heart through tubes (blood vessels) called arteries. Aneurysms are weak or damaged places in the wall of an artery. It bulges out like a balloon. An abdominal aortic aneurysm happens in the main artery of the body (aorta). It can burst or tear, causing bleeding inside the body. This is an emergency. It needs treatment right away. What are the causes? The exact cause is unknown. Things that could cause this problem include:  Fat and other substances building up in the lining of a tube.  Swelling of the walls of a blood vessel.  Certain tissue diseases.  Belly (abdominal) trauma.  An infection in the main artery of the body.  What increases the risk? There are things that make it more likely for you to have an aneurysm. These include:  Being over the age of 76 years old.  Having high blood pressure (hypertension).  Being a female.  Being white.  Being very overweight (obese).  Having a family history of aneurysm.  Using tobacco products.  What are the signs or symptoms? Symptoms depend on the size of the aneurysm and how fast it grows. There may not be symptoms. If symptoms occur, they can include:  Pain (belly, side, lower back, or groin).  Feeling full after eating a small amount of food.  Feeling sick to your stomach (nauseous), throwing up (vomiting), or both.  Feeling a lump in your belly that feels like it is beating (pulsating).  Feeling like you will pass out (faint).  How is this treated?  Medicine to control blood pressure and pain.  Imaging tests to see if the aneurysm gets bigger.  Surgery. How is this prevented? To lessen your chance of getting this condition:  Stop smoking. Stop chewing tobacco.  Limit or avoid alcohol.  Keep your blood pressure, blood sugar, and cholesterol within normal limits.  Eat less salt.  Eat foods low in saturated fats and cholesterol. These are found in animal and  whole dairy products.  Eat more fiber. Fiber is found in whole grains, vegetables, and fruits.  Keep a healthy weight.  Stay active and exercise often.  This information is not intended to replace advice given to you by your health care provider. Make sure you discuss any questions you have with your health care provider. Document Released: 02/17/2013 Document Revised: 03/30/2016 Document Reviewed: 11/22/2012 Elsevier Interactive Patient Education  2017 Elsevier Inc.  

## 2018-09-13 NOTE — Assessment & Plan Note (Signed)
blood pressure control important in reducing the progression of atherosclerotic disease and aneurysmal growth. On appropriate oral medications.  

## 2018-09-13 NOTE — Assessment & Plan Note (Signed)
lipid control important in reducing the progression of atherosclerotic disease. Continue statin therapy  

## 2018-09-19 ENCOUNTER — Ambulatory Visit: Payer: Medicare Other | Attending: Urology | Admitting: Physical Therapy

## 2018-09-19 DIAGNOSIS — M533 Sacrococcygeal disorders, not elsewhere classified: Secondary | ICD-10-CM | POA: Diagnosis not present

## 2018-09-19 DIAGNOSIS — R29898 Other symptoms and signs involving the musculoskeletal system: Secondary | ICD-10-CM | POA: Insufficient documentation

## 2018-09-19 DIAGNOSIS — R278 Other lack of coordination: Secondary | ICD-10-CM | POA: Insufficient documentation

## 2018-09-19 DIAGNOSIS — M6281 Muscle weakness (generalized): Secondary | ICD-10-CM | POA: Diagnosis not present

## 2018-09-19 NOTE — Patient Instructions (Addendum)
Deep core level *( breathing, less effort)   Deep core level 2 * 6 min knee out, less belly soften then move knee out -no pushing with stomach    Both knees to chest ( towel under thighs Exhale to lift thighs to chest while  pressing elbows and shoulders down)  10 reps    Open book  ( trunk rotation)  15reps   Clams shells (hips)  20 reps    _____   Sabrina Montoya [phone] for assistance     [facility] to contact [place] who you worked with after the death of your husband    [facility] [place], [place] [address] General Information  Phone: [phone] Toll-free: [phone] Email: [email or web address]

## 2018-09-20 NOTE — Therapy (Addendum)
[place] MAIN [facility] SERVICES [address] [place], [place], [address] Phone: [phone]   Fax:  [phone]  Physical Therapy Treatment  Patient Details  Name: Sabrina Montoya MRN: [id number] Date of Birth: 01-Sep-1941 Referring Provider (PT): Hollice Espy   Encounter Date: 09/19/2018  PT End of Session - 09/20/18 1448    Visit Number  5    Number of Visits  12    PT Start Time  1115    PT Stop Time  1250    PT Time Calculation (min)  95 min    Activity Tolerance  Patient tolerated treatment well    Behavior During Therapy  [facility] for tasks assessed/performed       Past Medical History:  Diagnosis Date  . Abdominal aortic aneurysm without mention of rupture    infrarenal, stable, folllowed by Leotis Pain  . Acute posthemorrhagic anemia   . Arthritis   . B12 deficiency   . CAD (coronary artery disease), autologous vein bypass graft   . Cardiac dysrhythmia, unspecified   . Colon polyp   . Depression   . Diverticulitis of colon   . Dizziness    DUE TO MEDICINES  . Dysrhythmia   . GERD (gastroesophageal reflux disease)   . HOH (hard of hearing)   . Hyperlipidemia   . Hypertension    pt denies. placed on meds after CABG  . IBS (irritable bowel syndrome)   . Neuropathy   . Peripheral vascular disease ([place])    s/p CEA   . Reflux esophagitis   . Rheumatic fever    possible at age 26  . Sliding hiatal hernia   . Tobacco abuse   . Tobacco abuse     Past Surgical History:  Procedure Laterality Date  . ABDOMINAL SURGERY  1976   for pain secondary to scar tissue, s/p apply  . APPENDECTOMY  1974  . BACK SURGERY    . CAROTID ENDARTERECTOMY    . CATARACT EXTRACTION W/PHACO Right 10/18/2016   Procedure: CATARACT EXTRACTION PHACO AND INTRAOCULAR LENS PLACEMENT (IOC);  Surgeon: Estill Cotta, MD;  Location: [facility] ORS;  Service: Ophthalmology;  Laterality: Right;  Lot # [id number] H [place]: 01:04.0 AP%: 24.4 CDE: 30.44  . CATARACT  EXTRACTION W/PHACO Left 03/14/2017   Procedure: CATARACT EXTRACTION PHACO AND INTRAOCULAR LENS PLACEMENT ([place])  Left;  Surgeon: Leandrew Koyanagi, MD;  Location: [place];  Service: Ophthalmology;  Laterality: Left;  . CEA     Carotid stenosis found during workup for dysphagia,  [facility]  . 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 CHOLECYSTECTOMY  2002   Dr. Pat Patrick  . CLOSED MANIPULATION SHOULDER     left shoulder post physical therapy, Right shoulder redo (toggle bolts)  . COLONOSCOPY  09/01/14  . coronary angiography  01/2011   one occluded artery with collateralization, other arteries patent  . CORONARY ARTERY BYPASS GRAFT  2006   s/p triple bypass surgery, [facility]  . heart catherization  2016  . HERNIA REPAIR  may 2011   Dr. Pat Patrick  . hernia repair    . LUMBAR DISC SURGERY  1971   L5, unssuccessful, fusion in 1983 successful (2 lumbar)  . PARTIAL HYSTERECTOMY    . ROTATOR CUFF REPAIR  2002   right shoulder Dr. Nadean Corwin, [place] in [place]      There were no vitals filed for this visit.  Subjective Assessment - 09/19/18 1111    Subjective  Pt stopped doing the exercises with the bands because they hurt the lower back of her back. Pt was making two pots of soup and her back hurt. Pth as to sit down and use her heating pads.  Currently her low back is at 3/10.     Pertinent History  Kidney Stage 3, CABG, partial hysterectomy  2016,  Vagainal deliveries x 2 with large sized babies ( episiotomies) , appendectomy,  abdominal aorta aneurysm, low abdominal surgery with scar removal surgeries, gall bladder removed . Back surgeries and fusion          [facility] PT Assessment - 09/20/18 1449      Palpation   Spinal mobility  significantly increased tensions of interspinal / intercostals B along thorax, paraspinals and QL B,  lumbar scar restrictions                     [facility] Adult  PT Treatment/Exercise - 09/20/18 1449      Neuro Re-ed    Neuro Re-ed Details   see pt instructions      Moist Heat Therapy   Number Minutes Moist Heat  10 Minutes   with guided body scan     Moist Heat Location  Lumbar Spine      Manual Therapy   Manual therapy comments  MWM at interspinal mm along upper thoracic/lumbar spine B , R posterior intercostals                   PT Long Term Goals - 09/19/18 1112      PT LONG TERM GOAL #1   Title  Pt will decrease her PFDI score from 58% to < 30% in order to restore pelvic floor function    Time  12    Period  Weeks    Status  New      PT LONG TERM GOAL #2   Title  Pt will decrease her ODI score from 26% to < 16% in order to return to ADLs with less back pain    Time  8    Period  Weeks    Status  New      PT LONG TERM GOAL #3   Title  Pt will demo decreased scar restrictions at sternum, upper/lower quadrant of abdomen, and LBP in order to optimize ROM of deep core mm and progress with deep core exercises     Time  4    Period  Weeks    Status  New      PT LONG TERM GOAL #4   Title  Pt will demo proper body mechanics in functional activities ( sit to stand, log rolling out of bed, gardening, floor<> stand, household chores ) and fitness exercises in order to minimize worsening of prolapse.     Time  6    Period  Weeks    Status  New            Plan - 09/20/18 1450    Clinical Impression Statement  Following Tx, Pt demo'd improved scapular control with shoulder depression with exhalation,  decreased mm tensions along lower back, increased mobility in thoracic spine. Pt reported  resolved upper low back back post Tx. Pt was tearful during visit, expressing grief and sadness about her deceased husband and feeling lonely. Pt was provided biopsychosocial approaches, active listening to pt's current stressors in her life, provided resources to the issues  she was facing Programme researcher, broadcasting/film/video, Education officer, museum ) to better find  professional assistance. Pt reported feeling less stressed and appeared with brighter affect, not tearful post Tx. Withheld band exercises due to pt's report of increased back pain.  Pt continues to benefit from skilled PT     Rehab Potential  Good    PT Frequency  1x / week    PT Duration  12 weeks    PT Treatment/Interventions  Moist Heat;Functional mobility training;Neuromuscular re-education;Gait training;Stair training;Aquatic Therapy;Therapeutic activities;Therapeutic exercise;Balance training;Electrical Stimulation;Scar mobilization;Manual lymph drainage;Manual techniques;Patient/family education;Taping;Energy conservation;Biofeedback;Cryotherapy    Consulted and Agree with Plan of Care  Patient       Patient will benefit from skilled therapeutic intervention in order to improve the following deficits and impairments:  Improper body mechanics, Pain, Increased fascial restricitons, Increased muscle spasms, Decreased mobility, Decreased coordination, Decreased scar mobility, Decreased endurance, Decreased activity tolerance, Decreased range of motion, Decreased strength, Decreased safety awareness, Hypomobility, Postural dysfunction  Visit Diagnosis: Other symptoms and signs involving the musculoskeletal system  Other lack of coordination  Muscle weakness (generalized)     Problem List Patient Active Problem List   Diagnosis Date Noted  . Hypertension 09/13/2018  . Fatigue 06/07/2018  . Insomnia due to anxiety and fear 04/20/2018  . Chronic bladder pain 12/23/2017  . Candidiasis of skin 12/23/2017  . Irritable bowel syndrome (IBS) 12/23/2017  . Atrophic vaginitis 02/11/2017  . CKD (chronic kidney disease) stage 3, GFR 30-59 ml/min (HCC) 08/24/2016  . Dysphagia, pharyngoesophageal phase 06/20/2016  . Sciatica of left side 06/20/2016  . Type 2 DM with CKD stage 3 and hypertension ([place]) 05/16/2016  . Breast cancer screening 02/06/2016  . Depression with somatization 01/27/2015   . Cervicalgia 01/09/2015  . Cystocele 08/29/2014  . Anemia 05/02/2014  . TIA (transient ischemic attack) 09/11/2013  . Neuropathy 07/01/2013  . Overweight (BMI 25.0-29.9) 03/16/2013  . Chronic reflux esophagitis 12/29/2012  . Right hip pain 12/29/2012  . History of tobacco abuse 07/03/2012  . AAA (abdominal aortic aneurysm) without rupture ([place])   . CAD (coronary artery disease), autologous vein bypass graft   . Peripheral vascular disease ([place])   . Arthritis 02/29/2012  . Asymptomatic carotid artery stenosis 01/05/2012  . Hyperlipidemia   . Osteopenia 08/29/2011  . Vitamin D deficiency 08/29/2011    Jerl Mina ,PT, DPT, E-RYT  09/20/2018, 2:53 PM  [place] MAIN [facility] SERVICES [address] [place], [place], [address] Phone: [phone]   Fax:  [phone]  Name: Sabrina Montoya MRN: [id number] Date of Birth: 02/20/1941

## 2018-09-27 ENCOUNTER — Ambulatory Visit: Payer: Medicare Other | Admitting: Physical Therapy

## 2018-10-09 ENCOUNTER — Ambulatory Visit: Payer: Medicare Other | Admitting: Physical Therapy

## 2018-10-11 ENCOUNTER — Encounter: Payer: Medicare Other | Admitting: Physical Therapy

## 2018-10-11 ENCOUNTER — Ambulatory Visit: Payer: Medicare Other | Attending: Urology | Admitting: Physical Therapy

## 2018-10-11 DIAGNOSIS — M6281 Muscle weakness (generalized): Secondary | ICD-10-CM | POA: Insufficient documentation

## 2018-10-11 DIAGNOSIS — R29898 Other symptoms and signs involving the musculoskeletal system: Secondary | ICD-10-CM | POA: Diagnosis not present

## 2018-10-11 DIAGNOSIS — R278 Other lack of coordination: Secondary | ICD-10-CM | POA: Insufficient documentation

## 2018-10-11 NOTE — Therapy (Signed)
[place] MAIN [facility] SERVICES [address] [place], [place], [address] Phone: [phone]   Fax:  [phone]  Physical Therapy Treatment  Patient Details  Name: Sabrina Montoya MRN: [id number] Date of Birth: 11/06/1941 Referring Provider (PT): Hollice Espy   Encounter Date: 10/11/2018  PT End of Session - 10/11/18 1001    Visit Number  6    Number of Visits  12    PT Start Time  0910    PT Stop Time  1001    PT Time Calculation (min)  51 min    Activity Tolerance  Patient tolerated treatment well    Behavior During Therapy  [facility] for tasks assessed/performed       Past Medical History:  Diagnosis Date  . Abdominal aortic aneurysm without mention of rupture    infrarenal, stable, folllowed by Leotis Pain  . Acute posthemorrhagic anemia   . Arthritis   . B12 deficiency   . CAD (coronary artery disease), autologous vein bypass graft   . Cardiac dysrhythmia, unspecified   . Colon polyp   . Depression   . Diverticulitis of colon   . Dizziness    DUE TO MEDICINES  . Dysrhythmia   . GERD (gastroesophageal reflux disease)   . HOH (hard of hearing)   . Hyperlipidemia   . Hypertension    pt denies. placed on meds after CABG  . IBS (irritable bowel syndrome)   . Neuropathy   . Peripheral vascular disease ([place])    s/p CEA   . Reflux esophagitis   . Rheumatic fever    possible at age 77  . Sliding hiatal hernia   . Tobacco abuse   . Tobacco abuse     Past Surgical History:  Procedure Laterality Date  . ABDOMINAL SURGERY  1976   for pain secondary to scar tissue, s/p apply  . APPENDECTOMY  1974  . BACK SURGERY    . CAROTID ENDARTERECTOMY    . CATARACT EXTRACTION W/PHACO Right 10/18/2016   Procedure: CATARACT EXTRACTION PHACO AND INTRAOCULAR LENS PLACEMENT (IOC);  Surgeon: Estill Cotta, MD;  Location: [facility] ORS;  Service: Ophthalmology;  Laterality: Right;  Lot # [id number] H [place]: 01:04.0 AP%: 24.4 CDE: 30.44  . CATARACT EXTRACTION  W/PHACO Left 03/14/2017   Procedure: CATARACT EXTRACTION PHACO AND INTRAOCULAR LENS PLACEMENT ([place])  Left;  Surgeon: Leandrew Koyanagi, MD;  Location: [place];  Service: Ophthalmology;  Laterality: Left;  . CEA     Carotid stenosis found during workup for dysphagia,  [facility]  . 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 CHOLECYSTECTOMY  2002   Dr. Pat Patrick  . CLOSED MANIPULATION SHOULDER     left shoulder post physical therapy, Right shoulder redo (toggle bolts)  . COLONOSCOPY  09/01/14  . coronary angiography  01/2011   one occluded artery with collateralization, other arteries patent  . CORONARY ARTERY BYPASS GRAFT  2006   s/p triple bypass surgery, [facility]  . heart catherization  2016  . HERNIA REPAIR  may 2011   Dr. Pat Patrick  . hernia repair    . LUMBAR DISC SURGERY  1971   L5, unssuccessful, fusion in 1983 successful (2 lumbar)  . PARTIAL HYSTERECTOMY    . ROTATOR CUFF REPAIR  2002   right shoulder Dr. Nadean Corwin, [place] in [place]      There were no vitals filed for this visit.  Subjective Assessment - 10/11/18 0914    Subjective  Pt [place] and made it back with feeling stiff in her back. Pt had R front thigh pain that woke her up in the middle of the night which is new.  Pt had to really press her gas pedal to go up hill on her mountain trip.  Pt noticed she slept well at her relatives and only used her heating pad only once on her trip. Pt also was able to making it to he bathroom in time.  Pt notices her prolapse is not as low.     Pertinent History  Kidney Stage 3, CABG, partial hysterectomy  2016,  Vagainal deliveries x 76 with large sized babies ( episiotomies) , appendectomy,  abdominal aorta aneurysm, low abdominal surgery with scar removal surgeries, gall bladder removed . Back surgeries and fusion          [facility] PT Assessment - 10/11/18  0957      Observation/Other Assessments   Observations  difficulty with sit to stand after session due to stiff back. improved after knee to chest stretches       Palpation   Spinal mobility  significantly increased tensions vastus lateralis R, IT band                   [facility] Adult PT Treatment/Exercise - 10/11/18 0957      Neuro Re-ed    Neuro Re-ed Details   see pt instructions      Moist Heat Therapy   Number Minutes Moist Heat  5 Minutes    Moist Heat Location  --   thigh R     Manual Therapy   Manual therapy comments  MWM/ STM at vastus lateralis R, IT band                   PT Long Term Goals - 09/19/18 1112      PT LONG TERM GOAL #76   Title  Pt will decrease her PFDI score from 58% to < 30% in order to restore pelvic floor function    Time  12    Period  Weeks    Status  New      PT LONG TERM GOAL #2   Title  Pt will decrease her ODI score from 26% to < 16% in order to return to ADLs with less back pain    Time  8    Period  Weeks    Status  New      PT LONG TERM GOAL #3   Title  Pt will demo decreased scar restrictions at sternum, upper/lower quadrant of abdomen, and LBP in order to optimize ROM of deep core mm and progress with deep core exercises     Time  4    Period  Weeks    Status  New      PT LONG TERM GOAL #4   Title  Pt will demo proper body mechanics in functional activities ( sit to stand, log rolling out of bed, gardening, floor<> stand, household chores ) and fitness exercises in order to minimize worsening of prolapse.     Time  6    Period  Weeks    Status  New            Plan - 10/11/18 1001    Clinical Impression Statement  Pt notices her prolapse is not as low. Pt was able to  drive to the [place] without complaints of back pain except for a stiff back, which indicates improvement. Addressed new R thigh pain which is likely associated with long hours of driving and pressing gas pedal. Post Tx, pt demo'd  decreased mm tightness and reported less pain. Added hip flexor stretch and reinforced the importance of regular walking routine to increase hip/leg strength. Pt had difficulty standing up after Tx due to back pain but had less difficulty after performing knee to chest stretch. Pt continues to benefit from skilled PT    Rehab Potential  Good    PT Frequency  1x / week    PT Duration  12 weeks    PT Treatment/Interventions  Moist Heat;Functional mobility training;Neuromuscular re-education;Gait training;Stair training;Aquatic Therapy;Therapeutic activities;Therapeutic exercise;Balance training;Electrical Stimulation;Scar mobilization;Manual lymph drainage;Manual techniques;Patient/family education;Taping;Energy conservation;Biofeedback;Cryotherapy    Consulted and Agree with Plan of Care  Patient       Patient will benefit from skilled therapeutic intervention in order to improve the following deficits and impairments:  Improper body mechanics, Pain, Increased fascial restricitons, Increased muscle spasms, Decreased mobility, Decreased coordination, Decreased scar mobility, Decreased endurance, Decreased activity tolerance, Decreased range of motion, Decreased strength, Decreased safety awareness, Hypomobility, Postural dysfunction  Visit Diagnosis: Other symptoms and signs involving the musculoskeletal system  Other lack of coordination  Muscle weakness (generalized)     Problem List Patient Active Problem List   Diagnosis Date Noted  . Hypertension 09/13/2018  . Fatigue 06/07/2018  . Insomnia due to anxiety and fear 04/20/2018  . Chronic bladder pain 12/23/2017  . Candidiasis of skin 12/23/2017  . Irritable bowel syndrome (IBS) 12/23/2017  . Atrophic vaginitis 02/11/2017  . CKD (chronic kidney disease) stage 3, GFR 30-59 ml/min (HCC) 08/24/2016  . Dysphagia, pharyngoesophageal phase 06/20/2016  . Sciatica of left side 06/20/2016  . Type 2 DM with CKD stage 3 and hypertension ([place])  05/16/2016  . Breast cancer screening 02/06/2016  . Depression with somatization 01/27/2015  . Cervicalgia 01/09/2015  . Cystocele 08/29/2014  . Anemia 05/02/2014  . TIA (transient ischemic attack) 09/11/2013  . Neuropathy 07/01/2013  . Overweight (BMI 25.0-29.9) 03/16/2013  . Chronic reflux esophagitis 12/29/2012  . Right hip pain 12/29/2012  . History of tobacco abuse 07/03/2012  . AAA (abdominal aortic aneurysm) without rupture ([place])   . CAD (coronary artery disease), autologous vein bypass graft   . Peripheral vascular disease ([place])   . Arthritis 02/29/2012  . Asymptomatic carotid artery stenosis 01/05/2012  . Hyperlipidemia   . Osteopenia 08/29/2011  . Vitamin D deficiency 08/29/2011    Jerl Mina ,PT, DPT, E-RYT  10/11/2018, 4:25 PM  [place] MAIN [facility] SERVICES [address] [place], [place], [address] Phone: [phone]   Fax:  [phone]  Name: ZAIRE VANBUSKIRK MRN: [id number] Date of Birth: January 05, 1941

## 2018-10-11 NOTE — Patient Instructions (Signed)
Activity pacing Rest breaks 6 min deep core level 2   Be easier on yourself with your to do list  Be satisfied with yourself with what you did get done   ___    Walk 10 min x 2 day   Hip flexor by the wall stretch and quad stretch  5 breaths   Other stretches from past sessions

## 2018-10-13 IMAGING — MG MM DIGITAL SCREENING BILAT W/ TOMO W/ CAD
8 series · 8 of 24 positions shown · non-contrast
Comparison: Previous exam(s).

CLINICAL DATA: Screening.

EXAM:
DIGITAL SCREENING BILATERAL MAMMOGRAM WITH TOMO AND CAD

[R CC synth-2D]
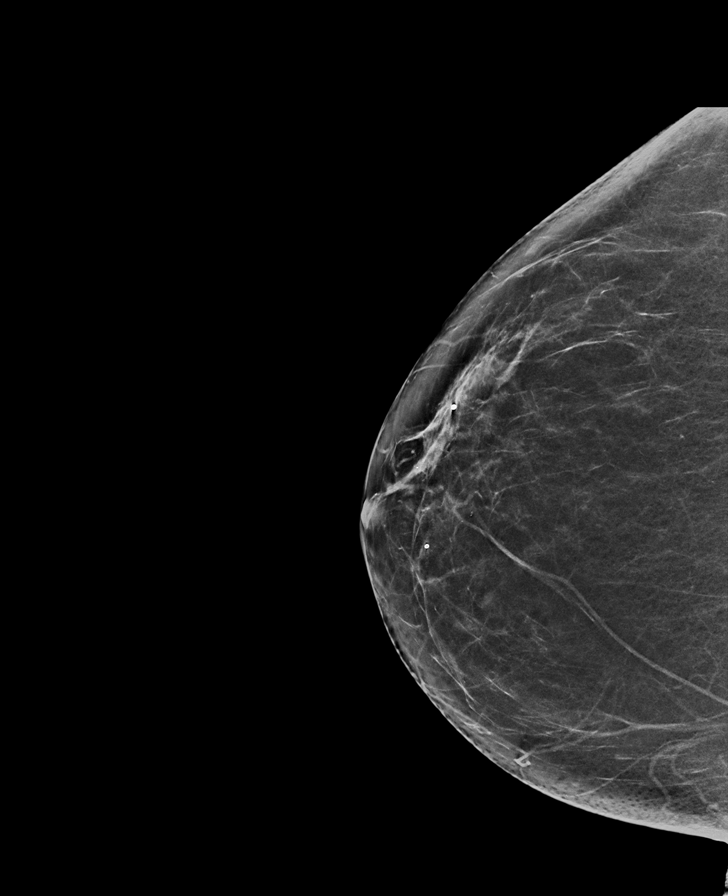

[R MLO synth-2D]
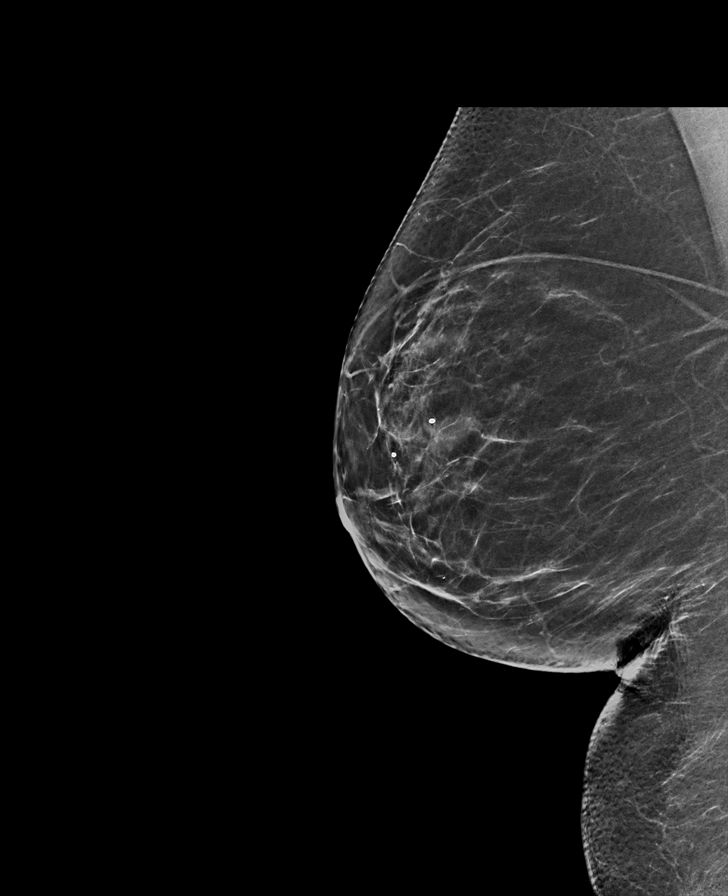

[L MLO synth-2D]
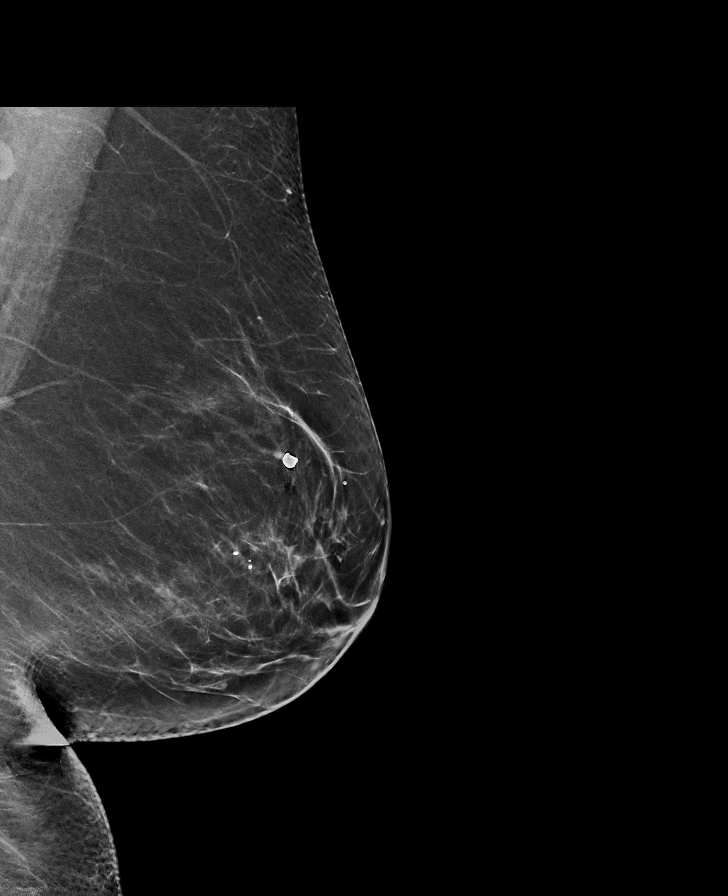

[L CC synth-2D]
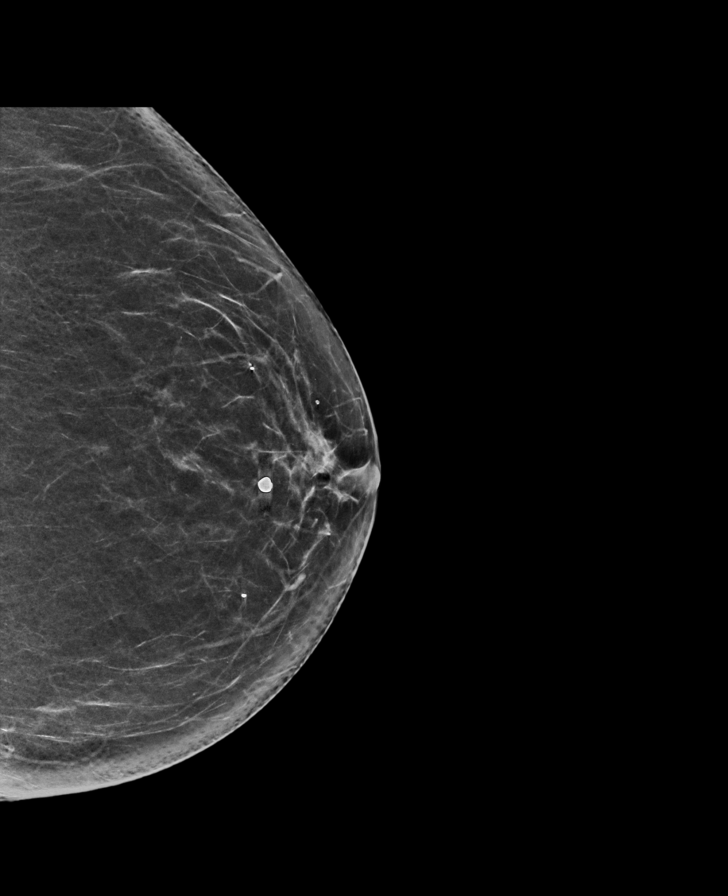

[R MLO tomo · tomo slice 37/73.0]
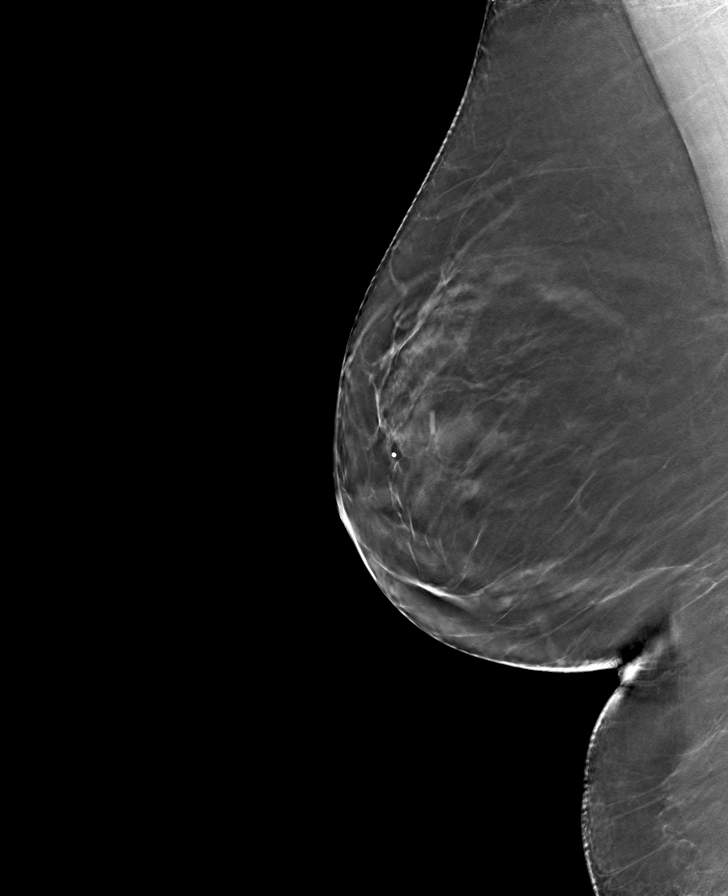

[L MLO tomo · tomo slice 39/78.0]
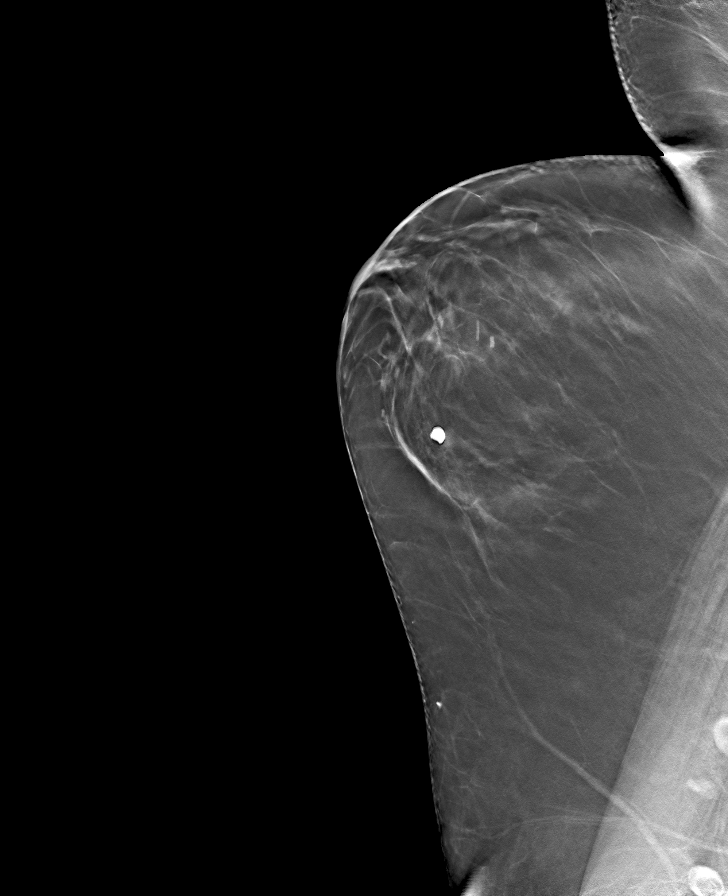

[R CC tomo · tomo slice 35/69.0]
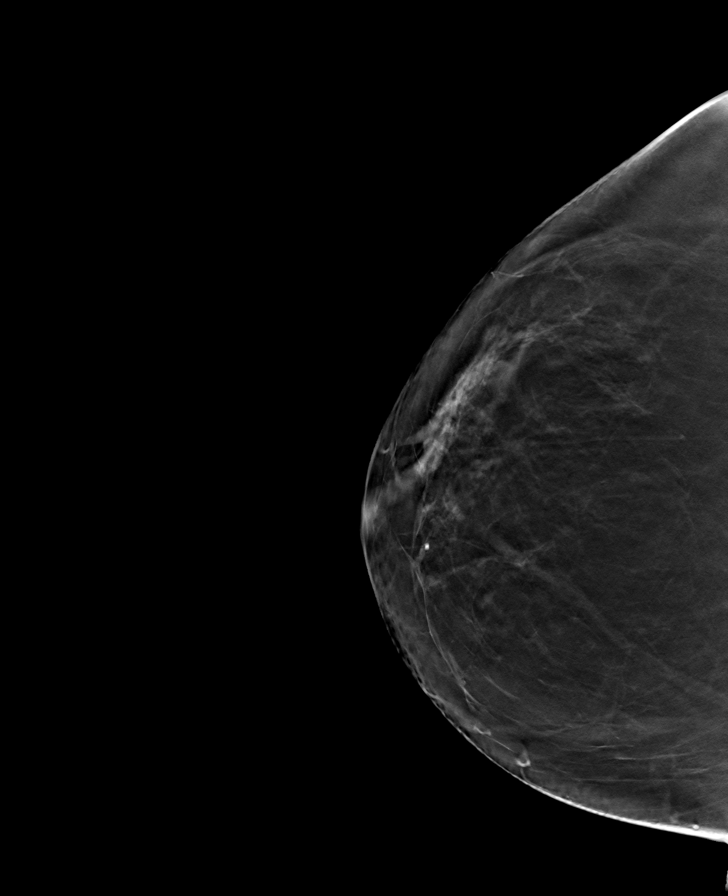

[L CC tomo · tomo slice 35/69.0]
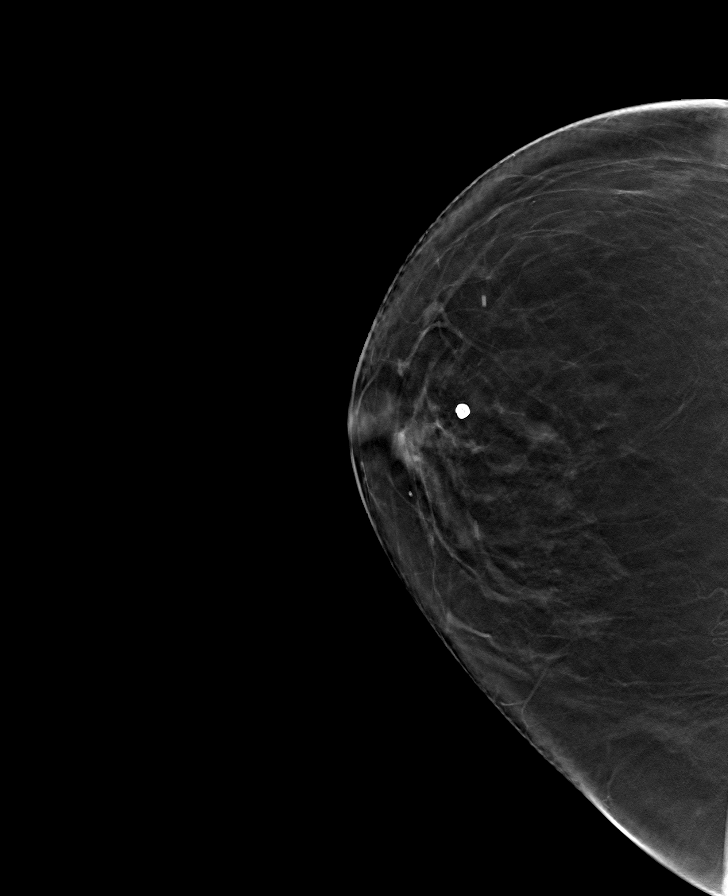

[8 of 24 positions shown; findings below may reference images not displayed]

ACR Breast Density Category b: There are scattered areas of
fibroglandular density.
FINDINGS: There are no findings suspicious for malignancy. Images were
processed with CAD.
IMPRESSION: No mammographic evidence of malignancy. A result letter of this
screening mammogram will be mailed directly to the patient.

RECOMMENDATION:
Screening mammogram in one year. (Code:[id number])

BI-RADS CATEGORY  1: Negative.

## 2018-10-14 DIAGNOSIS — H353121 Nonexudative age-related macular degeneration, left eye, early dry stage: Secondary | ICD-10-CM | POA: Diagnosis not present

## 2018-10-16 ENCOUNTER — Ambulatory Visit: Payer: Medicare Other | Admitting: Physical Therapy

## 2018-10-16 DIAGNOSIS — M6281 Muscle weakness (generalized): Secondary | ICD-10-CM | POA: Diagnosis not present

## 2018-10-16 DIAGNOSIS — R278 Other lack of coordination: Secondary | ICD-10-CM | POA: Diagnosis not present

## 2018-10-16 DIAGNOSIS — R29898 Other symptoms and signs involving the musculoskeletal system: Secondary | ICD-10-CM

## 2018-10-16 NOTE — Patient Instructions (Addendum)
Side step with mini squat around [employer] for L and R  Every morning while waiting for water to boil for coffee   DO this barefeet no socks   Good job, keep up with the other exercises ti keep back looser Good JOB, [place]!

## 2018-10-16 NOTE — Therapy (Addendum)
[place] MAIN [facility] SERVICES [address] [place], [place], [address] Phone: [phone]   Fax:  [phone]  Physical Therapy Treatment  Patient Details  Name: Sabrina Montoya MRN: [id number] Date of Birth: 1941/03/15 Referring Provider (PT): Hollice Espy   Encounter Date: 10/16/2018  PT End of Session - 10/16/18 1125    Visit Number  7    Number of Visits  12    PT Start Time  7408    PT Stop Time  1145    PT Time Calculation (min)  40 min    Activity Tolerance  Patient tolerated treatment well    Behavior During Therapy  [facility] for tasks assessed/performed       Past Medical History:  Diagnosis Date  . Abdominal aortic aneurysm without mention of rupture    infrarenal, stable, folllowed by Leotis Pain  . Acute posthemorrhagic anemia   . Arthritis   . B12 deficiency   . CAD (coronary artery disease), autologous vein bypass graft   . Cardiac dysrhythmia, unspecified   . Colon polyp   . Depression   . Diverticulitis of colon   . Dizziness    DUE TO MEDICINES  . Dysrhythmia   . GERD (gastroesophageal reflux disease)   . HOH (hard of hearing)   . Hyperlipidemia   . Hypertension    pt denies. placed on meds after CABG  . IBS (irritable bowel syndrome)   . Neuropathy   . Peripheral vascular disease ([place])    s/p CEA   . Reflux esophagitis   . Rheumatic fever    possible at age 48  . Sliding hiatal hernia   . Tobacco abuse   . Tobacco abuse     Past Surgical History:  Procedure Laterality Date  . ABDOMINAL SURGERY  1976   for pain secondary to scar tissue, s/p apply  . APPENDECTOMY  1974  . BACK SURGERY    . CAROTID ENDARTERECTOMY    . CATARACT EXTRACTION W/PHACO Right 10/18/2016   Procedure: CATARACT EXTRACTION PHACO AND INTRAOCULAR LENS PLACEMENT (IOC);  Surgeon: Estill Cotta, MD;  Location: [facility] ORS;  Service: Ophthalmology;  Laterality: Right;  Lot # [id number] H [place]: 01:04.0 AP%: 24.4 CDE: 30.44  . CATARACT  EXTRACTION W/PHACO Left 03/14/2017   Procedure: CATARACT EXTRACTION PHACO AND INTRAOCULAR LENS PLACEMENT ([place])  Left;  Surgeon: Leandrew Koyanagi, MD;  Location: [place];  Service: Ophthalmology;  Laterality: Left;  . CEA     Carotid stenosis found during workup for dysphagia,  [facility]  . 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 CHOLECYSTECTOMY  2002   Dr. Pat Patrick  . CLOSED MANIPULATION SHOULDER     left shoulder post physical therapy, Right shoulder redo (toggle bolts)  . COLONOSCOPY  09/01/14  . coronary angiography  01/2011   one occluded artery with collateralization, other arteries patent  . CORONARY ARTERY BYPASS GRAFT  2006   s/p triple bypass surgery, [facility]  . heart catherization  2016  . HERNIA REPAIR  may 2011   Dr. Pat Patrick  . hernia repair    . LUMBAR DISC SURGERY  1971   L5, unssuccessful, fusion in 1983 successful (2 lumbar)  . PARTIAL HYSTERECTOMY    . ROTATOR CUFF REPAIR  2002   right shoulder Dr. Nadean Corwin, [place] in [place]      There were no vitals filed for this visit.  Subjective Assessment - 10/16/18 1111    Subjective  Pt has been busy baking at day for 16 horus and her back bothered at the end and sat with a heating pad. Pt had no issues getting out of bed this morning after sleeping 4 hours.  When pt took her bath the other day and she noticed her prolapse was not has low as it was.      Pertinent History  Kidney Stage 3, CABG, partial hysterectomy  2016,  Vaginal deliveries x 2 with large sized babies ( episiotomies) , appendectomy,  abdominal aorta aneurysm, low abdominal surgery with scar removal surgeries, gall bladder removed . Back surgeries and fusion          [facility] PT Assessment - 10/16/18 1122      Squat   Comments  good form, minor cues                    [facility] Adult PT Treatment/Exercise - 10/16/18 1123      Neuro Re-ed    Neuro  Re-ed Details   see pt instructions      Exercises   Exercises  --   see pt instructions leg press 20x, (75lb), 5 min Nu Step with upright posture, no resistance                  PT Long Term Goals - 09/19/18 1112      PT LONG TERM GOAL #1   Title  Pt will decrease her PFDI score from 58% to < 30% in order to restore pelvic floor function    Time  12    Period  Weeks    Status  New      PT LONG TERM GOAL #2   Title  Pt will decrease her ODI score from 26% to < 16% in order to return to ADLs with less back pain    Time  8    Period  Weeks    Status  New      PT LONG TERM GOAL #3   Title  Pt will demo decreased scar restrictions at sternum, upper/lower quadrant of abdomen, and LBP in order to optimize ROM of deep core mm and progress with deep core exercises     Time  4    Period  Weeks    Status  New      PT LONG TERM GOAL #4   Title  Pt will demo proper body mechanics in functional activities ( sit to stand, log rolling out of bed, gardening, floor<> stand, household chores ) and fitness exercises in order to minimize worsening of prolapse.     Time  6    Period  Weeks    Status  New            Plan - 10/16/18 1126    Clinical Impression Statement  Pt showed good compliance with stretches and showed less spinal mm tensions and more upright posture. Added motivational interviewing to add more exercises into her day. Sidetepping and ministep exericse was performed and added into her morning routine in her kitchen PT requried minor cues for alignment. Pt reports he prolapse Sx have improved. Focusing on BLE strengthening with leg press/ cable column. Provided education on body mechanics to minimize worsening of AAA and signs for immediate medical care in the event of extreme abdominal pain.  Pt continues to benefit from skilled PT.     Rehab Potential  Good  PT Frequency  1x / week    PT Duration  12 weeks    PT Treatment/Interventions  Moist Heat;Functional  mobility training;Neuromuscular re-education;Gait training;Stair training;Aquatic Therapy;Therapeutic activities;Therapeutic exercise;Balance training;Electrical Stimulation;Scar mobilization;Manual lymph drainage;Manual techniques;Patient/family education;Taping;Energy conservation;Biofeedback;Cryotherapy    Consulted and Agree with Plan of Care  Patient       Patient will benefit from skilled therapeutic intervention in order to improve the following deficits and impairments:  Improper body mechanics, Pain, Increased fascial restricitons, Increased muscle spasms, Decreased mobility, Decreased coordination, Decreased scar mobility, Decreased endurance, Decreased activity tolerance, Decreased range of motion, Decreased strength, Decreased safety awareness, Hypomobility, Postural dysfunction  Visit Diagnosis: Other symptoms and signs involving the musculoskeletal system  Other lack of coordination  Muscle weakness (generalized)     Problem List Patient Active Problem List   Diagnosis Date Noted  . Hypertension 09/13/2018  . Fatigue 06/07/2018  . Insomnia due to anxiety and fear 04/20/2018  . Chronic bladder pain 12/23/2017  . Candidiasis of skin 12/23/2017  . Irritable bowel syndrome (IBS) 12/23/2017  . Atrophic vaginitis 02/11/2017  . CKD (chronic kidney disease) stage 3, GFR 30-59 ml/min (HCC) 08/24/2016  . Dysphagia, pharyngoesophageal phase 06/20/2016  . Sciatica of left side 06/20/2016  . Type 2 DM with CKD stage 3 and hypertension ([place]) 05/16/2016  . Breast cancer screening 02/06/2016  . Depression with somatization 01/27/2015  . Cervicalgia 01/09/2015  . Cystocele 08/29/2014  . Anemia 05/02/2014  . TIA (transient ischemic attack) 09/11/2013  . Neuropathy 07/01/2013  . Overweight (BMI 25.0-29.9) 03/16/2013  . Chronic reflux esophagitis 12/29/2012  . Right hip pain 12/29/2012  . History of tobacco abuse 07/03/2012  . AAA (abdominal aortic aneurysm) without rupture ([place])    . CAD (coronary artery disease), autologous vein bypass graft   . Peripheral vascular disease ([place])   . Arthritis 02/29/2012  . Asymptomatic carotid artery stenosis 01/05/2012  . Hyperlipidemia   . Osteopenia 08/29/2011  . Vitamin D deficiency 08/29/2011    Jerl Mina ,PT, DPT, E-RYT  10/16/2018, 11:31 AM  [place] MAIN [facility] SERVICES [address] [place], [place], [address] Phone: [phone]   Fax:  [phone]  Name: Sabrina Montoya MRN: [id number] Date of Birth: Apr 08, 1941

## 2018-10-17 DIAGNOSIS — I129 Hypertensive chronic kidney disease with stage 1 through stage 4 chronic kidney disease, or unspecified chronic kidney disease: Secondary | ICD-10-CM | POA: Diagnosis not present

## 2018-10-17 DIAGNOSIS — N183 Chronic kidney disease, stage 3 (moderate): Secondary | ICD-10-CM | POA: Diagnosis not present

## 2018-10-17 DIAGNOSIS — R82998 Other abnormal findings in urine: Secondary | ICD-10-CM | POA: Diagnosis not present

## 2018-10-17 DIAGNOSIS — D631 Anemia in chronic kidney disease: Secondary | ICD-10-CM | POA: Diagnosis not present

## 2018-10-17 DIAGNOSIS — N2581 Secondary hyperparathyroidism of renal origin: Secondary | ICD-10-CM | POA: Diagnosis not present

## 2018-10-18 ENCOUNTER — Encounter: Payer: Medicare Other | Admitting: Physical Therapy

## 2018-10-21 ENCOUNTER — Encounter: Payer: Self-pay | Admitting: Internal Medicine

## 2018-10-21 ENCOUNTER — Ambulatory Visit (INDEPENDENT_AMBULATORY_CARE_PROVIDER_SITE_OTHER): Payer: Medicare Other | Admitting: Internal Medicine

## 2018-10-21 VITALS — BP 130/74 | HR 79 | Temp 98.4°F | Resp 15 | Ht 64.0 in | Wt 156.2 lb

## 2018-10-21 DIAGNOSIS — F329 Major depressive disorder, single episode, unspecified: Secondary | ICD-10-CM | POA: Diagnosis not present

## 2018-10-21 DIAGNOSIS — I714 Abdominal aortic aneurysm, without rupture, unspecified: Secondary | ICD-10-CM

## 2018-10-21 DIAGNOSIS — I25810 Atherosclerosis of coronary artery bypass graft(s) without angina pectoris: Secondary | ICD-10-CM

## 2018-10-21 DIAGNOSIS — N183 Chronic kidney disease, stage 3 (moderate): Secondary | ICD-10-CM | POA: Diagnosis not present

## 2018-10-21 DIAGNOSIS — F450 Somatization disorder: Secondary | ICD-10-CM

## 2018-10-21 DIAGNOSIS — E1122 Type 2 diabetes mellitus with diabetic chronic kidney disease: Secondary | ICD-10-CM

## 2018-10-21 DIAGNOSIS — I129 Hypertensive chronic kidney disease with stage 1 through stage 4 chronic kidney disease, or unspecified chronic kidney disease: Secondary | ICD-10-CM

## 2018-10-21 DIAGNOSIS — F32A Depression, unspecified: Secondary | ICD-10-CM

## 2018-10-21 DIAGNOSIS — N390 Urinary tract infection, site not specified: Secondary | ICD-10-CM | POA: Diagnosis not present

## 2018-10-21 NOTE — Progress Notes (Signed)
Subjective:  Patient ID: Sabrina Montoya, female    DOB: [birth year]  Age: 77 y.o. MRN: [id number]  CC: The primary encounter diagnosis was Type 2 DM with CKD stage 3 and hypertension ([place]). Diagnoses of AAA (abdominal aortic aneurysm) without rupture [facility]), Coronary artery disease involving autologous vein coronary bypass graft without angina pectoris, and Depression with somatization were also pertinent to this visit.  HPI Sabrina Montoya presents for follow up on chronic conditions .  Vascular follow up in Nov:  Normal ABIS;  Aortic aneurysm : measuring  3.3 cm unchanged   Receiving Pelvic PT for prolapsed bladder.   symptoms improving   Depression history:  Taking wellbutrin    Mood and Energy improved  Recently spent 18 hours in one day cooking pound cakes. ! Also took a 4.5 hour drive to [place] to see Don's family  Spent a week with them.    CAD: has stopped metoprolol.  Energy level much improved.    Lost her best fried Tylene Fantasia  And lost another childhood friend recently too.   Type 2 DM diagnosed in 2017:    Patient has no complaints today.  Patient is following a low glycemic index diet and taking all prescribed medications regularly without side effects.  Fasting sugars have been under less than 140 most of the time and post prandials have been under 160 except on rare occasions. Patient is exercising about 3 times per week and intentionally trying to lose weight .  Patient has had an eye exam in the last 12 months and checks feet regularly for signs of infection.  Patient does not walk barefoot outside,  And denies an numbness tingling or burning in feet. Patient is up to date on all recommended vaccinations  Lab Results  Component Value Date   HGBA1C 6.7 (H) 04/18/2018          Outpatient Medications Prior to Visit  Medication Sig Dispense Refill  . aspirin 81 MG tablet Take 81 mg by mouth daily.    Marland Kitchen buPROPion (WELLBUTRIN XL) 150 MG 24 hr tablet TAKE 1 TABLET BY MOUTH ONCE  EVERY MORNING 90 tablet 1  . cholecalciferol (VITAMIN D) 1000 UNITS tablet Take 1,000 Units by mouth daily.    Marland Kitchen conjugated estrogens (PREMARIN) vaginal cream Place 1 Applicatorful vaginally daily. Use pea sized amount M-W-Fr before bedtime 42.5 g 12  . dicyclomine (BENTYL) 20 MG tablet Take 1 tablet (20 mg total) by mouth 2 (two) times daily. 60 tablet 3  . isosorbide mononitrate (IMDUR) 30 MG 24 hr tablet Take 30 mg by mouth daily.    Marland Kitchen ketoconazole (NIZORAL) 2 % cream     . metroNIDAZOLE (METROGEL) 0.75 % gel     . Multiple Vitamins-Minerals (PRESERVISION AREDS 2 PO) Take by mouth 2 (two) times daily.    Marland Kitchen omeprazole (PRILOSEC) 20 MG capsule TAKE 1 CAPSULE BY MOUTH ONCE DAILY 90 capsule 1  . rosuvastatin (CRESTOR) 40 MG tablet Take 1 tablet (40 mg total) by mouth daily. 90 tablet 0  . triamcinolone cream (KENALOG) 0.1 %     . metoprolol tartrate (LOPRESSOR) 25 MG tablet TAKE 1/2 TABLET BY MOUTH TWICE DAILY (Patient not taking: No sig reported) 30 tablet 3  . Peppermint Oil (IBGARD PO) Take by mouth daily.     No facility-administered medications prior to visit.     Review of Systems;  Patient denies headache, fevers, malaise, unintentional weight loss, skin rash, eye pain, sinus congestion and sinus  pain, sore throat, dysphagia,  hemoptysis , cough, dyspnea, wheezing, chest pain, palpitations, orthopnea, edema, abdominal pain, nausea, melena, diarrhea, constipation, flank pain, dysuria, hematuria, urinary  Frequency, nocturia, numbness, tingling, seizures,  Focal weakness, Loss of consciousness,  Tremor, insomnia, depression, anxiety, and suicidal ideation.      Objective:  BP 130/74 (BP Location: Left Arm, Patient Position: Sitting, Cuff Size: Normal)   Pulse 79   Temp 98.4 F (36.9 C) (Oral)   Resp 15   Ht 5\' 4"  (1.626 m)   Wt 156 lb 3.2 oz (70.9 kg)   SpO2 98%   BMI 26.81 kg/m   BP Readings from Last 3 Encounters:  10/21/18 130/74  09/13/18 121/66  08/05/18 110/80    Wt  Readings from Last 3 Encounters:  10/21/18 156 lb 3.2 oz (70.9 kg)  09/13/18 155 lb (70.3 kg)  07/02/18 155 lb 4 oz (70.4 kg)    General appearance: alert, cooperative and appears stated age Ears: normal TM's and external ear canals both ears Throat: lips, mucosa, and tongue normal; teeth and gums normal Neck: no adenopathy, no carotid bruit, supple, symmetrical, trachea midline and thyroid not enlarged, symmetric, no tenderness/mass/nodules Back: symmetric, no curvature. ROM normal. No CVA tenderness. Lungs: clear to auscultation bilaterally Heart: regular rate and rhythm, S1, S2 normal, no murmur, click, rub or gallop Abdomen: soft, non-tender; bowel sounds normal; no masses,  no organomegaly Pulses: 2+ and symmetric Skin: Skin color, texture, turgor normal. No rashes or lesions Lymph nodes: Cervical, supraclavicular, and axillary nodes normal.  Lab Results  Component Value Date   HGBA1C 6.7 (H) 04/18/2018   HGBA1C 6.4 11/13/2017   HGBA1C 6.7 (H) 02/09/2017    Lab Results  Component Value Date   CREATININE 1.16 04/18/2018   CREATININE 1.15 11/13/2017   CREATININE 1.30 (H) 08/15/2017    Lab Results  Component Value Date   WBC 4.2 04/18/2018   HGB 10.6 (L) 04/18/2018   HCT 31.0 (L) 04/18/2018   PLT 211.0 04/18/2018   GLUCOSE 138 (H) 04/18/2018   CHOL 212 (H) 11/13/2017   TRIG 245.0 (H) 11/13/2017   HDL 32.80 (L) 11/13/2017   LDLDIRECT 48.0 04/18/2018   LDLCALC 53 02/09/2017   ALT 18 04/18/2018   AST 29 04/18/2018   NA 142 04/18/2018   K 4.4 04/18/2018   CL 106 04/18/2018   CREATININE 1.16 04/18/2018   BUN 26 (H) 04/18/2018   CO2 29 04/18/2018   TSH 1.79 03/07/2016   HGBA1C 6.7 (H) 04/18/2018   MICROALBUR 0.9 04/18/2018    Mm 3d Screen Breast Bilateral  Result Date: 06/14/2018 CLINICAL DATA:  Screening. EXAM: DIGITAL SCREENING BILATERAL MAMMOGRAM WITH TOMO AND CAD COMPARISON:  Previous exam(s). ACR Breast Density Category b: There are scattered areas of  fibroglandular density. FINDINGS: There are no findings suspicious for malignancy. Images were processed with CAD. IMPRESSION: No mammographic evidence of malignancy. A result letter of this screening mammogram will be mailed directly to the patient. RECOMMENDATION: Screening mammogram in one year. (Code:SM-B-01Y) BI-RADS CATEGORY  1: Negative. Electronically Signed   By: Franki Cabot M.D.   On: 06/14/2018 10:14    Assessment & Plan:   Problem List Items Addressed This Visit    AAA (abdominal aortic aneurysm) without rupture ([place])    Advised patient to resume metoprolol at 1/2 tablet daily in the evening to keep BP < 130/80 and to manage CAD       CAD (coronary artery disease), autologous vein bypass graft  Symptom free.  Resume 1/2 tablet metoprolol at night,  Continue crestor and imdur       Depression with somatization    Improved outlook ,  No changes today .  continue Wellbutrin       Type 2 DM with CKD stage 3 and hypertension ([place]) - Primary    Historically well-controlled on diet alone , she is due for follow up. Patient is up-to-date on eye exams and overdue for labs.  foot exam has been done today. Patient has no history of microalbuminuria. Patient is tolerating statin therapy for CAD risk reduction and asa  for secondary prevention    Lab Results  Component Value Date   HGBA1C 6.7 (H) 04/18/2018   Lab Results  Component Value Date   MICROALBUR 0.9 04/18/2018   Lab Results  Component Value Date   CREATININE 1.16 04/18/2018         Relevant Orders   Comprehensive metabolic panel   Hemoglobin A1C   Lipid Profile    A total of 25 minutes of face to face time was spent with patient more than half of which was spent in counselling about the above mentioned conditions  and coordination of care   I have discontinued Baneen H. Braley's Peppermint Oil (IBGARD PO). I am also having her maintain her cholecalciferol, aspirin, isosorbide mononitrate, Multiple Vitamins-Minerals  (PRESERVISION AREDS 2 PO), dicyclomine, metoprolol tartrate, buPROPion, conjugated estrogens, rosuvastatin, omeprazole, ketoconazole, metroNIDAZOLE, and triamcinolone cream.  No orders of the defined types were placed in this encounter.   Medications Discontinued During This Encounter  Medication Reason  . Peppermint Oil (IBGARD PO) Patient Preference    Follow-up: No follow-ups on file.   Crecencio Mc, MD

## 2018-10-21 NOTE — Patient Instructions (Addendum)
I'm glad you are feeling better !   Regarding your medication,  Less is more!!!   I  recommend starting back on 12.5 mg of metoprolol  At night  AFTER THE HOLIDAYS if your readings after a week  ARE   ALL 130/80 OR HIGHER    I recommend using Beano with lunch and dinner.  THIS WILL REDUCE THE GAS CAUSED BY HEALTHY EATING

## 2018-10-23 ENCOUNTER — Other Ambulatory Visit (INDEPENDENT_AMBULATORY_CARE_PROVIDER_SITE_OTHER): Payer: Medicare Other

## 2018-10-23 ENCOUNTER — Encounter: Payer: Self-pay | Admitting: Internal Medicine

## 2018-10-23 ENCOUNTER — Ambulatory Visit: Payer: Medicare Other | Admitting: Physical Therapy

## 2018-10-23 DIAGNOSIS — R29898 Other symptoms and signs involving the musculoskeletal system: Secondary | ICD-10-CM

## 2018-10-23 DIAGNOSIS — E1122 Type 2 diabetes mellitus with diabetic chronic kidney disease: Secondary | ICD-10-CM | POA: Diagnosis not present

## 2018-10-23 DIAGNOSIS — R278 Other lack of coordination: Secondary | ICD-10-CM

## 2018-10-23 DIAGNOSIS — N183 Chronic kidney disease, stage 3 (moderate): Secondary | ICD-10-CM | POA: Diagnosis not present

## 2018-10-23 DIAGNOSIS — I129 Hypertensive chronic kidney disease with stage 1 through stage 4 chronic kidney disease, or unspecified chronic kidney disease: Secondary | ICD-10-CM | POA: Diagnosis not present

## 2018-10-23 DIAGNOSIS — M6281 Muscle weakness (generalized): Secondary | ICD-10-CM

## 2018-10-23 LAB — COMPREHENSIVE METABOLIC PANEL
ALBUMIN: 4.7 g/dL (ref 3.5–5.2)
ALT: 17 U/L (ref 0–35)
AST: 32 U/L (ref 0–37)
Alkaline Phosphatase: 72 U/L (ref 39–117)
BUN: 26 mg/dL — ABNORMAL HIGH (ref 6–23)
CALCIUM: 9.7 mg/dL (ref 8.4–10.5)
CHLORIDE: 106 meq/L (ref 96–112)
CO2: 27 mEq/L (ref 19–32)
Creatinine, Ser: 1.33 mg/dL — ABNORMAL HIGH (ref 0.40–1.20)
GFR: 41.18 mL/min — ABNORMAL LOW (ref >60.00)
Glucose, Bld: 126 mg/dL — ABNORMAL HIGH (ref 70–99)
Potassium: 4.6 mEq/L (ref 3.5–5.1)
Sodium: 142 mEq/L (ref 135–145)
TOTAL PROTEIN: 7.3 g/dL (ref 6.0–8.3)
Total Bilirubin: 0.5 mg/dL (ref 0.2–1.2)

## 2018-10-23 LAB — LIPID PANEL
CHOL/HDL RATIO: 3
Cholesterol: 106 mg/dL (ref 0–200)
HDL: 40.1 mg/dL (ref >39.00)
LDL Cholesterol: 32 mg/dL (ref 0–99)
NonHDL: 65.94
TRIGLYCERIDES: 168 mg/dL — AB (ref 0.0–149.0)
VLDL: 33.6 mg/dL (ref 0.0–40.0)

## 2018-10-23 LAB — HEMOGLOBIN A1C: Hgb A1c MFr Bld: 6.8 % — ABNORMAL HIGH (ref 4.6–6.5)

## 2018-10-23 NOTE — Assessment & Plan Note (Signed)
Advised patient to resume metoprolol at 1/2 tablet daily in the evening to keep BP < 130/80 and to manage CAD

## 2018-10-23 NOTE — Assessment & Plan Note (Signed)
Historically well-controlled on diet alone , she is due for follow up. Patient is up-to-date on eye exams and overdue for labs.  foot exam has been done today. Patient has no history of microalbuminuria. Patient is tolerating statin therapy for CAD risk reduction and asa  for secondary prevention    Lab Results  Component Value Date   HGBA1C 6.7 (H) 04/18/2018   Lab Results  Component Value Date   MICROALBUR 0.9 04/18/2018   Lab Results  Component Value Date   CREATININE 1.16 04/18/2018

## 2018-10-23 NOTE — Assessment & Plan Note (Signed)
Symptoms improving with pelvic PT

## 2018-10-23 NOTE — Assessment & Plan Note (Signed)
Improved outlook ,  No changes today .  continue Wellbutrin

## 2018-10-23 NOTE — Assessment & Plan Note (Signed)
Symptom free.  Resume 1/2 tablet metoprolol at night,  Continue crestor and imdur

## 2018-10-24 NOTE — Therapy (Signed)
[place] MAIN [facility] SERVICES [address] [place], [place], [address] Phone: [phone]   Fax:  [phone]  Patient Details  Name: Sabrina Montoya MRN: [id number] Date of Birth: [birth year] Referring Provider:  Hollice Espy, MD  Encounter Date: 10/23/2018   Pt arrived today reporting she has been up since 2 am baking. Pt did her PT exercises in the kitchen and stretches at the sink.   Pt stated she was not going to come to today's PT session because she has "a lot to do" and would prefer to decline today's session. Pt plans to come to her next appt.  DPT educated pt the important of activity pacing and to ensure rest during her activities. Pt voiced understanding. No charges were billed today as no Tx were applied.  Jerl Mina ,PT, DPT, E-RYT  10/24/2018, 9:09 PM  [place] MAIN [facility] SERVICES [address] [place], [place], [address] Phone: [phone]   Fax:  [phone]

## 2018-10-25 ENCOUNTER — Encounter: Payer: Medicare Other | Admitting: Physical Therapy

## 2018-10-31 ENCOUNTER — Other Ambulatory Visit: Payer: Self-pay | Admitting: Internal Medicine

## 2018-11-14 DIAGNOSIS — Z85828 Personal history of other malignant neoplasm of skin: Secondary | ICD-10-CM | POA: Diagnosis not present

## 2018-11-14 DIAGNOSIS — L821 Other seborrheic keratosis: Secondary | ICD-10-CM | POA: Diagnosis not present

## 2018-11-14 DIAGNOSIS — L820 Inflamed seborrheic keratosis: Secondary | ICD-10-CM | POA: Diagnosis not present

## 2018-11-14 DIAGNOSIS — D1801 Hemangioma of skin and subcutaneous tissue: Secondary | ICD-10-CM | POA: Diagnosis not present

## 2018-11-27 DIAGNOSIS — I6523 Occlusion and stenosis of bilateral carotid arteries: Secondary | ICD-10-CM | POA: Diagnosis not present

## 2018-11-28 ENCOUNTER — Ambulatory Visit: Payer: Medicare Other | Attending: Urology | Admitting: Physical Therapy

## 2018-11-28 DIAGNOSIS — R278 Other lack of coordination: Secondary | ICD-10-CM | POA: Insufficient documentation

## 2018-11-28 DIAGNOSIS — M6281 Muscle weakness (generalized): Secondary | ICD-10-CM | POA: Diagnosis not present

## 2018-11-28 DIAGNOSIS — R29898 Other symptoms and signs involving the musculoskeletal system: Secondary | ICD-10-CM | POA: Diagnosis not present

## 2018-11-28 NOTE — Therapy (Signed)
[place] MAIN [facility] SERVICES [address] [place], [place], [address] Phone: [phone]   Fax:  [phone]  Physical Therapy Treatment/ Progress Note   Reporting 08/05/18- 11/28/18  Patient Details  Name: Sabrina Montoya MRN: [id number] Date of Birth: Nov 19, 1940 Referring Provider (PT): Hollice Espy   Encounter Date: 11/28/2018  PT End of Session - 11/28/18 1802    Visit Number  8    Number of Visits  12    PT Start Time  0903    PT Stop Time  1000    PT Time Calculation (min)  57 min    Activity Tolerance  Patient tolerated treatment well    Behavior During Therapy  [facility] for tasks assessed/performed       Past Medical History:  Diagnosis Date  . Abdominal aortic aneurysm without mention of rupture    infrarenal, stable, folllowed by Leotis Pain  . Acute posthemorrhagic anemia   . Arthritis   . B12 deficiency   . CAD (coronary artery disease), autologous vein bypass graft   . Cardiac dysrhythmia, unspecified   . Colon polyp   . Depression   . Diverticulitis of colon   . Dizziness    DUE TO MEDICINES  . Dysrhythmia   . GERD (gastroesophageal reflux disease)   . HOH (hard of hearing)   . Hyperlipidemia   . Hypertension    pt denies. placed on meds after CABG  . IBS (irritable bowel syndrome)   . Neuropathy   . Peripheral vascular disease ([place])    s/p CEA   . Reflux esophagitis   . Rheumatic fever    possible at age 41  . Sliding hiatal hernia   . Tobacco abuse   . Tobacco abuse     Past Surgical History:  Procedure Laterality Date  . ABDOMINAL SURGERY  1976   for pain secondary to scar tissue, s/p apply  . APPENDECTOMY  1974  . BACK SURGERY    . CAROTID ENDARTERECTOMY    . CATARACT EXTRACTION W/PHACO Right 10/18/2016   Procedure: CATARACT EXTRACTION PHACO AND INTRAOCULAR LENS PLACEMENT (IOC);  Surgeon: Estill Cotta, MD;  Location: [facility] ORS;  Service: Ophthalmology;  Laterality: Right;  Lot # [id number] H [place]:  01:04.0 AP%: 24.4 CDE: 30.44  . CATARACT EXTRACTION W/PHACO Left 03/14/2017   Procedure: CATARACT EXTRACTION PHACO AND INTRAOCULAR LENS PLACEMENT ([place])  Left;  Surgeon: Leandrew Koyanagi, MD;  Location: [place];  Service: Ophthalmology;  Laterality: Left;  . CEA     Carotid stenosis found during workup for dysphagia,  [facility]  . 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 CHOLECYSTECTOMY  2002   Dr. Pat Patrick  . CLOSED MANIPULATION SHOULDER     left shoulder post physical therapy, Right shoulder redo (toggle bolts)  . COLONOSCOPY  09/01/14  . coronary angiography  01/2011   one occluded artery with collateralization, other arteries patent  . CORONARY ARTERY BYPASS GRAFT  2006   s/p triple bypass surgery, [facility]  . heart catherization  2016  . HERNIA REPAIR  may 2011   Dr. Pat Patrick  . hernia repair    . LUMBAR DISC SURGERY  1971   L5, unssuccessful, fusion in 1983 successful (2 lumbar)  . PARTIAL HYSTERECTOMY    . ROTATOR CUFF REPAIR  2002   right shoulder Dr. Nadean Corwin, [place] in [place]      There were  no vitals filed for this visit.  Subjective Assessment - 11/28/18 0907    Subjective  Pt has been able to do more in her house than she used . Pt has not done her exercises as much. Pt's back pain today is at a 3-4/10.  It can vary to a 9/10 and it comes on quickly.  It has been radiating down the L leg lately down to back of the knee level.  Pt notices when she goes to the bathroom,  she still feels that her bladder is pushed up against her wall and not in the place where it was originally after the partial  hysterectomy.      Pertinent History  Kidney Stage 3, CABG, partial hysterectomy  2016,  Vaginal deliveries x 2 with large sized babies ( episiotomies) , appendectomy,  abdominal aorta aneurysm, low abdominal surgery with scar removal surgeries, gall bladder removed . Back surgeries and  fusion          [facility] PT Assessment - 11/28/18 4765      Coordination   Gross Motor Movements are Fluid and Coordinated  --   excessive upper abdominal mm activation with pelvic floor co     Ambulation/Gait   Gait Comments  more upright posture, less throacic flexion, quicker speed with better eccentric control                 Pelvic Floor Special Questions - 11/28/18 0937    Pelvic Floor Internal Exam  pt consented verbally without contraindications     Exam Type  Vaginal    Palpation  increased scar restrictions at 7 clock, tightess along medial ischial tuberosity and ATLA R  ( decreased post Tx)     Strength  fair squeeze, definite lift   dyscoordination with overuse of ab mm       [facility] Adult PT Treatment/Exercise - 11/28/18 0939      Neuro Re-ed    Neuro Re-ed Details   see pt instructions with pelvic floor quick contractions with cues to not overuse abdominal mm ,  verbal/visual cues for proper lengthening and cranial movement of pelvic floor mm      Moist Heat Therapy   Number Minutes Moist Heat  5 Minutes   performed with deeep core training   Moist Heat Location  --   perineum ( pillow case and sheet)      Manual Therapy   Internal Pelvic Floor  scar releases with STM/MWM at problem areas noted in assessment                    PT Long Term Goals - 11/28/18 0911      PT LONG TERM GOAL #1   Title  Pt will decrease her PFDI score from 58% to < 30% in order to restore pelvic floor function ( 1/23: 27%)     Time  12    Period  Weeks    Status  Achieved      PT LONG TERM GOAL #2   Title  Pt will decrease her ODI score from 26% to < 16% in order to return to ADLs with less back pain ( 1/21:  23%)     Time  8    Period  Weeks    Status  Partially Met      PT LONG TERM GOAL #3   Title  Pt will demo decreased scar restrictions at sternum, upper/lower quadrant of abdomen, and LBP in order  to optimize ROM of deep core mm and progress with deep  core exercises     Time  4    Period  Weeks    Status  Achieved      PT LONG TERM GOAL #4   Title  Pt will demo proper body mechanics in functional activities ( sit to stand, log rolling out of bed, gardening, floor<> stand, household chores ) and fitness exercises in order to minimize worsening of prolapse.     Time  6    Period  Weeks    Status  Achieved            Plan - 11/28/18 1802    Clinical Impression Statement  Pt has achieved 3/4 goals with significantly decreased PFDI score ( see goal section) which indicates improved pelvic function related to fecal incontinence and frequent bowel movements.  Across the past visits, pt's pelvic alignment and SIJ mobility have improved. Her back pain has improved and pt is able to preform more activities such has standing to bake for longer periods of time.  Pt's bladder position is in a more cranial position compared to past visit. Today,  pt required pelvic floor scar releases to optimize pelvic floor legnthening and stronger contraction. Pt demo's proper co-activation of deep core mm which will yield greater improvement for intraabdominal pressure and postural stability to decrease her LBP. Plan to continue strengthening BLE and pelvic floor.  Pt continues to benefit from skilled PT.     Rehab Potential  Good    PT Frequency  1x / week    PT Duration  12 weeks    PT Treatment/Interventions  Moist Heat;Functional mobility training;Neuromuscular re-education;Gait training;Stair training;Aquatic Therapy;Therapeutic activities;Therapeutic exercise;Balance training;Electrical Stimulation;Scar mobilization;Manual lymph drainage;Manual techniques;Patient/family education;Taping;Energy conservation;Biofeedback;Cryotherapy    Consulted and Agree with Plan of Care  Patient       Patient will benefit from skilled therapeutic intervention in order to improve the following deficits and impairments:  Improper body mechanics, Pain, Increased fascial  restricitons, Increased muscle spasms, Decreased mobility, Decreased coordination, Decreased scar mobility, Decreased endurance, Decreased activity tolerance, Decreased range of motion, Decreased strength, Decreased safety awareness, Hypomobility, Postural dysfunction  Visit Diagnosis: No diagnosis found.     Problem List Patient Active Problem List   Diagnosis Date Noted  . Hypertension 09/13/2018  . Fatigue 06/07/2018  . Insomnia due to anxiety and fear 04/20/2018  . Chronic bladder pain 12/23/2017  . Candidiasis of skin 12/23/2017  . Irritable bowel syndrome (IBS) 12/23/2017  . Atrophic vaginitis 02/11/2017  . CKD (chronic kidney disease) stage 3, GFR 30-59 ml/min (HCC) 08/24/2016  . Dysphagia, pharyngoesophageal phase 06/20/2016  . Sciatica of left side 06/20/2016  . Type 2 DM with CKD stage 3 and hypertension ([place]) 05/16/2016  . Breast cancer screening 02/06/2016  . Depression with somatization 01/27/2015  . Cervicalgia 01/09/2015  . Cystocele 08/29/2014  . Anemia 05/02/2014  . TIA (transient ischemic attack) 09/11/2013  . Neuropathy 07/01/2013  . Overweight (BMI 25.0-29.9) 03/16/2013  . Chronic reflux esophagitis 12/29/2012  . Right hip pain 12/29/2012  . History of tobacco abuse 07/03/2012  . AAA (abdominal aortic aneurysm) without rupture ([place])   . CAD (coronary artery disease), autologous vein bypass graft   . Peripheral vascular disease ([place])   . Arthritis 02/29/2012  . Asymptomatic carotid artery stenosis 01/05/2012  . Hyperlipidemia   . Osteopenia 08/29/2011  . Vitamin D deficiency 08/29/2011    Jerl Mina ,PT, DPT, E-RYT  11/28/2018, 6:10 PM  [place] MAIN [facility] SERVICES [address] [place], [place], [address] Phone: [phone]   Fax:  [phone]  Name: Sabrina Montoya MRN: [id number] Date of Birth: Jul 27, 1941

## 2018-11-28 NOTE — Patient Instructions (Signed)
Stretch Pelvic floor  1) "v heels slide away and then back toward buttocks and then rock knee to slight ,  slide heel along at 11 o clock away from buttocks   10 reps   ________  Practice proper pelvic floor coordination  Inhale: expand pelvic floor muscles Exhale" "j" scoop, allow pelvic floor to close, lift first before belly sinks   ( not "draw abdominal muscle to spine" or strain with abdominal muscles")  ________   PELVIC FLOOR / KEGEL EXERCISES   Pelvic floor/ Kegel exercises are used to strengthen the muscles in the base of your pelvis that are responsible for supporting your pelvic organs and preventing urine/feces leakage. Based on your therapist's recommendations, they can be performed while standing, sitting, or lying down. Imagine pelvic floor area as a diamond with pelvic landmarks: top =pubic bone, bottom tip=tailbone, sides=sitting bones (ischial tuberosities).    Make yourself aware of this muscle group by using these cues while coordinating your breath:  Inhale, feel pelvic floor diamond area lower like hammock towards your feet and ribcage/belly expanding. Pause. Let the exhale naturally and feel your belly sink, abdominal muscles hugging in around you and you may notice the pelvic diamond draws upward towards your head forming a umbrella shape. Give a squeeze during the exhalation like you are stopping the flow of urine. If you are squeezing the buttock muscles, try to give 50% less effort.   Common Errors:  Breath holding: If you are holding your breath, you may be bearing down against your bladder instead of pulling it up. If you belly bulges up while you are squeezing, you are holding your breath. Be sure to breathe gently in and out while exercising. Counting out loud may help you avoid holding your breath.  Accessory muscle use: You should not see or feel other muscle movement when performing pelvic floor exercises. When done properly, no one can tell that you  are performing the exercises. Keep the buttocks, belly and inner thighs relaxed.  Overdoing it: Your muscles can fatigue and stop working for you if you over-exercise. You may actually leak more or feel soreness at the lower abdomen or rectum.  YOUR HOME EXERCISE PROGRAM     SHORT HOLDS: Position: on back WHEN DOING THE DEEPC ORE LEVEL 1 ( BREATHING)   Inhale and then exhale. Then squeeze the muscle.  (Be sure to let belly sink in with exhales and not push outward)  Perform 10 repetitions when WHEN DOING THE DEEPC ORE LEVEL 1 ( BREATHING)   ____________   Be sure to do deep core level 1 ( b reathing + quick squeeze 10 )   and level 2 ( knee out)  twice a day Bring exercise bike inside the house to make sure you are riding 15 min x 2 x day to strengthening legs and then do leg back  stretches  Afterwards                    DECREASE DOWNWARD PRESSURE ON  YOUR PELVIC FLOOR, ABDOMINAL, LOW BACK MUSCLES       PRESERVE YOUR PELVIC HEALTH LONG-TERM   ** SQUEEZE pelvic floor BEFORE YOUR SNEEZE, COUGH, LAUGH   ** EXHALE BEFORE YOU RISE AGAINST GRAVITY (lifting, sit to stand, from squat to stand)   ** LOG ROLL OUT OF BED INSTEAD OF CRUNCH/SIT-UP

## 2018-12-02 DIAGNOSIS — I6523 Occlusion and stenosis of bilateral carotid arteries: Secondary | ICD-10-CM | POA: Diagnosis not present

## 2018-12-10 ENCOUNTER — Ambulatory Visit: Payer: Medicare Other | Admitting: Physical Therapy

## 2018-12-24 ENCOUNTER — Ambulatory Visit: Payer: Medicare Other | Attending: Urology | Admitting: Physical Therapy

## 2018-12-24 DIAGNOSIS — M6281 Muscle weakness (generalized): Secondary | ICD-10-CM

## 2018-12-24 DIAGNOSIS — R29898 Other symptoms and signs involving the musculoskeletal system: Secondary | ICD-10-CM | POA: Insufficient documentation

## 2018-12-24 DIAGNOSIS — R278 Other lack of coordination: Secondary | ICD-10-CM | POA: Diagnosis not present

## 2018-12-26 NOTE — Therapy (Signed)
[place] MAIN [facility] SERVICES [address] [place], [place], [address] Phone: [phone]   Fax:  [phone]  Physical Therapy Treatment  Patient Details  Name: Sabrina Montoya MRN: [id number] Date of Birth: 05-18-1941 Referring Provider (PT): Hollice Espy   Encounter Date: 12/24/2018  PT End of Session - 12/26/18 1346    Visit Number  9    Number of Visits  12    Date for PT Re-Evaluation  02/06/19    Authorization Type  Medicare: new cert  11/14/30-01/09/56    PT Start Time  1510    PT Stop Time  1603    PT Time Calculation (min)  53 min    Activity Tolerance  Patient tolerated treatment well    Behavior During Therapy  [facility] for tasks assessed/performed       Past Medical History:  Diagnosis Date  . Abdominal aortic aneurysm without mention of rupture    infrarenal, stable, folllowed by Leotis Pain  . Acute posthemorrhagic anemia   . Arthritis   . B12 deficiency   . CAD (coronary artery disease), autologous vein bypass graft   . Cardiac dysrhythmia, unspecified   . Colon polyp   . Depression   . Diverticulitis of colon   . Dizziness    DUE TO MEDICINES  . Dysrhythmia   . GERD (gastroesophageal reflux disease)   . HOH (hard of hearing)   . Hyperlipidemia   . Hypertension    pt denies. placed on meds after CABG  . IBS (irritable bowel syndrome)   . Neuropathy   . Peripheral vascular disease ([place])    s/p CEA   . Reflux esophagitis   . Rheumatic fever    possible at age 78  . Sliding hiatal hernia   . Tobacco abuse   . Tobacco abuse     Past Surgical History:  Procedure Laterality Date  . ABDOMINAL SURGERY  1976   for pain secondary to scar tissue, s/p apply  . APPENDECTOMY  1974  . BACK SURGERY    . CAROTID ENDARTERECTOMY    . CATARACT EXTRACTION W/PHACO Right 10/18/2016   Procedure: CATARACT EXTRACTION PHACO AND INTRAOCULAR LENS PLACEMENT (IOC);  Surgeon: Estill Cotta, MD;  Location: [facility] ORS;  Service:  Ophthalmology;  Laterality: Right;  Lot # [id number] H [place]: 01:04.0 AP%: 24.4 CDE: 30.44  . CATARACT EXTRACTION W/PHACO Left 03/14/2017   Procedure: CATARACT EXTRACTION PHACO AND INTRAOCULAR LENS PLACEMENT ([place])  Left;  Surgeon: Leandrew Koyanagi, MD;  Location: [place];  Service: Ophthalmology;  Laterality: Left;  . CEA     Carotid stenosis found during workup for dysphagia,  [facility]  . 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 CHOLECYSTECTOMY  2002   Dr. Pat Patrick  . CLOSED MANIPULATION SHOULDER     left shoulder post physical therapy, Right shoulder redo (toggle bolts)  . COLONOSCOPY  09/01/14  . coronary angiography  01/2011   one occluded artery with collateralization, other arteries patent  . CORONARY ARTERY BYPASS GRAFT  2006   s/p triple bypass surgery, [facility]  . heart catherization  2016  . HERNIA REPAIR  may 2011   Dr. Pat Patrick  . hernia repair    . LUMBAR DISC SURGERY  1971   L5, unssuccessful, fusion in 1983 successful (2 lumbar)  . PARTIAL HYSTERECTOMY    . ROTATOR CUFF REPAIR  2002   right shoulder Dr. Nadean Corwin, [place]  in [place]  . VASCULAR SURGERY      There were no vitals filed for this visit.  Subjective Assessment - 12/26/18 1348    Subjective  For a week and half, pt has had shooting pain down her R leg to R outside ankle . currently 5/10 . It starts after eating and then she sits in a recliner for her heating pad.  "The bone hurts.  I can not sleep. "   Pt also noticed across the past week that both big toes are feeling numb   Pertinent History  Kidney Stage 3, CABG, partial hysterectomy  2016,  Vaginal deliveries x 2 with large sized babies ( episiotomies) , appendectomy,  abdominal aorta aneurysm, low abdominal surgery with scar removal surgeries, gall bladder removed . Back surgeries and fusion          [facility] PT Assessment - 12/26/18 1348      Observation/Other Assessments    Observations  toe and heel walking without difficulty       Strength   Overall Strength Comments  hip abd B 5/5       Palpation   Spinal mobility  no reprodcution of pain with spinal motions, WFL     SI assessment   Standing, L PSIS more posterior        Decreased dermatome on L3-4 on R > L             [facility] Adult PT Treatment/Exercise - 12/26/18 1348      Ambulation/Gait   Gait velocity  0.93 m/s     Gait Comments  limping, R hip sway, decreased L stance phase       Post Tx: gait without deviations, 1.04 m/s speed              PT Long Term Goals - 11/28/18 0911      PT LONG TERM GOAL #1   Title  Pt will decrease her PFDI score from 58% to < 30% in order to restore pelvic floor function ( 1/23: 27%)     Time  12    Period  Weeks    Status  Achieved      PT LONG TERM GOAL #2   Title  Pt will decrease her ODI score from 26% to < 16% in order to return to ADLs with less back pain ( 1/21:  23%)     Time  8    Period  Weeks    Status  Partially Met      PT LONG TERM GOAL #3   Title  Pt will demo decreased scar restrictions at sternum, upper/lower quadrant of abdomen, and LBP in order to optimize ROM of deep core mm and progress with deep core exercises     Time  4    Period  Weeks    Status  Achieved      PT LONG TERM GOAL #4   Title  Pt will demo proper body mechanics in functional activities ( sit to stand, log rolling out of bed, gardening, floor<> stand, household chores ) and fitness exercises in order to minimize worsening of prolapse.     Time  6    Period  Weeks    Status  Achieved            Plan - 12/26/18 1349    Clinical Impression Statement  Pt showed increased hip strength B but had relapse in spinal mm / SIJ hypomobility. This deficit is likely  associated with her recent episodes of sciatica. Pt was educated to stop falling asleeping in recliner and was educated about proper sleep hygiene and activity pacing with return to her  mobility HEP. Pt voiced understanding. Pt tolerated manual Tx without complaints and demo'd increased SIJ mobilty and gait speed and gait mechanics. Pt continues to benefit from skilled PT.   Educated pt to report to her PCP about the recent Sx of both big toes are feeling numb. Pt voiced understanding.    Rehab Potential  Good    PT Frequency  1x / week    PT Duration  Other (comment)   10   PT Treatment/Interventions  Moist Heat;Functional mobility training;Neuromuscular re-education;Gait training;Stair training;Aquatic Therapy;Therapeutic activities;Therapeutic exercise;Balance training;Electrical Stimulation;Scar mobilization;Manual lymph drainage;Manual techniques;Patient/family education;Taping;Energy conservation;Biofeedback;Cryotherapy    Consulted and Agree with Plan of Care  Patient       Patient will benefit from skilled therapeutic intervention in order to improve the following deficits and impairments:  Improper body mechanics, Pain, Increased fascial restricitons, Increased muscle spasms, Decreased mobility, Decreased coordination, Decreased scar mobility, Decreased endurance, Decreased activity tolerance, Decreased range of motion, Decreased strength, Decreased safety awareness, Hypomobility, Postural dysfunction  Visit Diagnosis: Muscle weakness (generalized)  Other lack of coordination  Other symptoms and signs involving the musculoskeletal system     Problem List Patient Active Problem List   Diagnosis Date Noted  . Hypertension 09/13/2018  . Fatigue 06/07/2018  . Insomnia due to anxiety and fear 04/20/2018  . Chronic bladder pain 12/23/2017  . Candidiasis of skin 12/23/2017  . Irritable bowel syndrome (IBS) 12/23/2017  . Atrophic vaginitis 02/11/2017  . CKD (chronic kidney disease) stage 3, GFR 30-59 ml/min (HCC) 08/24/2016  . Dysphagia, pharyngoesophageal phase 06/20/2016  . Sciatica of left side 06/20/2016  . Type 2 DM with CKD stage 3 and hypertension ([place])  05/16/2016  . Breast cancer screening 02/06/2016  . Depression with somatization 01/27/2015  . Cervicalgia 01/09/2015  . Cystocele 08/29/2014  . Anemia 05/02/2014  . TIA (transient ischemic attack) 09/11/2013  . Neuropathy 07/01/2013  . Overweight (BMI 25.0-29.9) 03/16/2013  . Chronic reflux esophagitis 12/29/2012  . Right hip pain 12/29/2012  . History of tobacco abuse 07/03/2012  . AAA (abdominal aortic aneurysm) without rupture ([place])   . CAD (coronary artery disease), autologous vein bypass graft   . Peripheral vascular disease ([place])   . Arthritis 02/29/2012  . Asymptomatic carotid artery stenosis 01/05/2012  . Hyperlipidemia   . Osteopenia 08/29/2011  . Vitamin D deficiency 08/29/2011    Jerl Mina ,PT, DPT, E-RYT  12/26/2018, 1:51 PM  [place] MAIN [facility] SERVICES [address] [place], [place], [address] Phone: [phone]   Fax:  [phone]  Name: ANTONIA JICHA MRN: [id number] Date of Birth: 17-Jun-1941

## 2018-12-26 NOTE — Patient Instructions (Signed)
Return to spinal flexibility exercises,    do not sleep in recliner, return to sleeping on bed   Place heating pad on bed after dinner   Stop screen time 2 hours before bed for better sleep   Pace your activities to minimize muscle tightness and tightness, incorporate stretches

## 2019-01-03 ENCOUNTER — Encounter: Payer: Medicare Other | Admitting: Physical Therapy

## 2019-01-07 DIAGNOSIS — I6523 Occlusion and stenosis of bilateral carotid arteries: Secondary | ICD-10-CM | POA: Diagnosis not present

## 2019-01-07 DIAGNOSIS — I25119 Atherosclerotic heart disease of native coronary artery with unspecified angina pectoris: Secondary | ICD-10-CM | POA: Diagnosis not present

## 2019-01-07 DIAGNOSIS — I10 Essential (primary) hypertension: Secondary | ICD-10-CM | POA: Diagnosis not present

## 2019-01-07 DIAGNOSIS — I714 Abdominal aortic aneurysm, without rupture: Secondary | ICD-10-CM | POA: Diagnosis not present

## 2019-01-09 ENCOUNTER — Ambulatory Visit: Payer: Medicare Other | Attending: Urology | Admitting: Physical Therapy

## 2019-01-09 DIAGNOSIS — R278 Other lack of coordination: Secondary | ICD-10-CM | POA: Diagnosis not present

## 2019-01-09 DIAGNOSIS — M6281 Muscle weakness (generalized): Secondary | ICD-10-CM | POA: Diagnosis not present

## 2019-01-09 DIAGNOSIS — R29898 Other symptoms and signs involving the musculoskeletal system: Secondary | ICD-10-CM

## 2019-01-09 NOTE — Patient Instructions (Addendum)
DVD     _ [facility])   Wed night class  Friday     ____ [employer] [place], [place] [address]  Monday 02:00 PM - 02:45 PM Movement for Mobility - Chair Accessible [place]  Friday 02:00 PM - 02:45 PM Movement for Mobility - Chair Accessible [place]  ___  Yoga poses:  Seated twist   Standing with one hand on the chair:    REVERSE Warrior    :   Feet are hip width apart, R foot one behind like you are on ski tracks,  L knee bent over ankle but not more forward then the ankle.  Make sure 50% weight is in the front foot/leg , 50% weight is the back foot/ leg     R hand on L hip.  Inhale lengthen spine first and reach up with R    Exhale turn navel to the L then the ribcage turns, look at the other wall. Rest R hand on L thigh  Keep maintaining  50% weight is in the front foot/leg , 50% weight is the back foot/ leg  And make sure the front knee is still pointed in the toe line of the 2nd toe.    3 breaths here.   _____  [employer] ( info)

## 2019-01-10 NOTE — Therapy (Signed)
[place] MAIN [facility] SERVICES [address] [place], [place], [address] Phone: [phone]   Fax:  [phone]  Physical Therapy Treatment  Patient Details  Name: Sabrina Montoya MRN: [id number] Date of Birth: [birth year] Referring Provider (PT): Hollice Espy   Encounter Date: 01/09/2019  PT End of Session - 01/09/19 1412    Visit Number  9    Number of Visits  12    Date for PT Re-Evaluation  02/06/19    Authorization Type  Medicare: new cert  1/44/31-03/10/99    PT Start Time  1315    PT Stop Time  1410    PT Time Calculation (min)  55 min    Activity Tolerance  Patient tolerated treatment well    Behavior During Therapy  [facility] for tasks assessed/performed       Past Medical History:  Diagnosis Date  . Abdominal aortic aneurysm without mention of rupture    infrarenal, stable, folllowed by Leotis Pain  . Acute posthemorrhagic anemia   . Arthritis   . B12 deficiency   . CAD (coronary artery disease), autologous vein bypass graft   . Cardiac dysrhythmia, unspecified   . Colon polyp   . Depression   . Diverticulitis of colon   . Dizziness    DUE TO MEDICINES  . Dysrhythmia   . GERD (gastroesophageal reflux disease)   . HOH (hard of hearing)   . Hyperlipidemia   . Hypertension    pt denies. placed on meds after CABG  . IBS (irritable bowel syndrome)   . Neuropathy   . Peripheral vascular disease ([place])    s/p CEA   . Reflux esophagitis   . Rheumatic fever    possible at age 51  . Sliding hiatal hernia   . Tobacco abuse   . Tobacco abuse     Past Surgical History:  Procedure Laterality Date  . ABDOMINAL SURGERY  1976   for pain secondary to scar tissue, s/p apply  . APPENDECTOMY  1974  . BACK SURGERY    . CAROTID ENDARTERECTOMY    . CATARACT EXTRACTION W/PHACO Right 10/18/2016   Procedure: CATARACT EXTRACTION PHACO AND INTRAOCULAR LENS PLACEMENT (IOC);  Surgeon: Estill Cotta, MD;  Location: [facility] ORS;  Service:  Ophthalmology;  Laterality: Right;  Lot # [id number] H [place]: 01:04.0 AP%: 24.4 CDE: 30.44  . CATARACT EXTRACTION W/PHACO Left 03/14/2017   Procedure: CATARACT EXTRACTION PHACO AND INTRAOCULAR LENS PLACEMENT ([place])  Left;  Surgeon: Leandrew Koyanagi, MD;  Location: [facility];  Service: Ophthalmology;  Laterality: Left;  . CEA     Carotid stenosis found during workup for dysphagia,  [facility]  . 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 CHOLECYSTECTOMY  2002   Dr. Pat Patrick  . CLOSED MANIPULATION SHOULDER     left shoulder post physical therapy, Right shoulder redo (toggle bolts)  . COLONOSCOPY  09/01/14  . coronary angiography  01/2011   one occluded artery with collateralization, other arteries patent  . CORONARY ARTERY BYPASS GRAFT  2006   s/p triple bypass surgery, [facility]  . heart catherization  2016  . HERNIA REPAIR  may 2011   Dr. Pat Patrick  . hernia repair    . LUMBAR DISC SURGERY  1971   L5, unssuccessful, fusion in 1983 successful (2 lumbar)  . PARTIAL HYSTERECTOMY    . ROTATOR CUFF REPAIR  2002   right shoulder Dr. Nadean Corwin, [place]  in [place]  . VASCULAR SURGERY      There were no vitals filed for this visit.  Subjective Assessment - 01/09/19 1313    Subjective  Pt has decreased her time sitting in her recliner. Pt has been able to do more activities after last session. Pt was doing her stretches until last Friday when she got a sore throat and chest pains. She has been taking it easy and had seen her cardiologist.     Pertinent History  Kidney Stage 3, CABG, partial hysterectomy  2016,  Vaginal deliveries x 2 with large sized babies ( episiotomies) , appendectomy,  abdominal aorta aneurysm, low abdominal surgery with scar removal surgeries, gall bladder removed . Back surgeries and fusion          [facility] PT Assessment - 01/10/19 1401      Observation/Other Assessments   Observations  brighter affect        Squat   Comments  minimal cues required       Lunges   Comments  seated and with single UE on chair in yoga posture . required propioception for lower kinetic chain       Palpation   Spinal mobility  signficantly decreased mm tightness praspinals, less rounded shoulders,                    [facility] Adult PT Treatment/Exercise - 01/10/19 1400      Therapeutic Activites    Therapeutic Activities  --   education on selecting proper yoga classes     Neuro Re-ed    Neuro Re-ed Details   cues for proper yoga alignment, chair poses, promote thoracic extensions       Manual Therapy   Manual therapy comments  thoracic mm STM                  PT Long Term Goals - 01/10/19 1403      PT LONG TERM GOAL #1   Title  Pt will decrease her PFDI score from 58% to < 30% in order to restore pelvic floor function ( 1/23: 27%)     Time  12    Period  Weeks    Status  Achieved      PT LONG TERM GOAL #2   Title  Pt will decrease her ODI score from 26% to < 16% in order to return to ADLs with less back pain ( 1/21:  23%)     Time  8    Period  Weeks    Status  Partially Met      PT LONG TERM GOAL #3   Title  Pt will demo decreased scar restrictions at sternum, upper/lower quadrant of abdomen, and LBP in order to optimize ROM of deep core mm and progress with deep core exercises     Time  4    Period  Weeks    Status  Achieved      PT LONG TERM GOAL #4   Title  Pt will demo proper body mechanics in functional activities ( sit to stand, log rolling out of bed, gardening, floor<> stand, household chores ) and fitness exercises in order to minimize worsening of prolapse.     Time  6    Period  Weeks    Status  Achieved      PT LONG TERM GOAL #5   Title  Pt will demo IND with proper technique and alignment with seated yoga poses and standing  poses with chair in order to maintain flexibility for health and wellness    Time  2    Period  Weeks    Status  New    Target Date   01/24/19            Plan - 01/10/19 1400    Clinical Impression Statement  Pt demo'd significantly mm tightness compared to previous sessions which is good carry over. Pt reports she stopped sleeping in her recliner. Pt expressed interest in yoga and DPT is also a yoga instructor and therefore, guided pt to chair yoga poses for mobility and balance. Pt continues to benefit from skilled PT and will likely be ready for d/c at next session   Rehab Potential  Good    PT Frequency  1x / week    PT Duration  Other (comment)   10   PT Treatment/Interventions  Moist Heat;Functional mobility training;Neuromuscular re-education;Gait training;Stair training;Aquatic Therapy;Therapeutic activities;Therapeutic exercise;Balance training;Electrical Stimulation;Scar mobilization;Manual lymph drainage;Manual techniques;Patient/family education;Taping;Energy conservation;Biofeedback;Cryotherapy    Consulted and Agree with Plan of Care  Patient       Patient will benefit from skilled therapeutic intervention in order to improve the following deficits and impairments:  Improper body mechanics, Pain, Increased fascial restricitons, Increased muscle spasms, Decreased mobility, Decreased coordination, Decreased scar mobility, Decreased endurance, Decreased activity tolerance, Decreased range of motion, Decreased strength, Decreased safety awareness, Hypomobility, Postural dysfunction  Visit Diagnosis: Other lack of coordination  Muscle weakness (generalized)  Other symptoms and signs involving the musculoskeletal system     Problem List Patient Active Problem List   Diagnosis Date Noted  . Hypertension 09/13/2018  . Fatigue 06/07/2018  . Insomnia due to anxiety and fear 04/20/2018  . Chronic bladder pain 12/23/2017  . Candidiasis of skin 12/23/2017  . Irritable bowel syndrome (IBS) 12/23/2017  . Atrophic vaginitis 02/11/2017  . CKD (chronic kidney disease) stage 3, GFR 30-59 ml/min (HCC) 08/24/2016   . Dysphagia, pharyngoesophageal phase 06/20/2016  . Sciatica of left side 06/20/2016  . Type 2 DM with CKD stage 3 and hypertension ([place]) 05/16/2016  . Breast cancer screening 02/06/2016  . Depression with somatization 01/27/2015  . Cervicalgia 01/09/2015  . Cystocele 08/29/2014  . Anemia 05/02/2014  . TIA (transient ischemic attack) 09/11/2013  . Neuropathy 07/01/2013  . Overweight (BMI 25.0-29.9) 03/16/2013  . Chronic reflux esophagitis 12/29/2012  . Right hip pain 12/29/2012  . History of tobacco abuse 07/03/2012  . AAA (abdominal aortic aneurysm) without rupture ([place])   . CAD (coronary artery disease), autologous vein bypass graft   . Peripheral vascular disease ([place])   . Arthritis 02/29/2012  . Asymptomatic carotid artery stenosis 01/05/2012  . Hyperlipidemia   . Osteopenia 08/29/2011  . Vitamin D deficiency 08/29/2011    Jerl Mina ,PT, DPT, E-RYT  01/10/2019, 2:04 PM  [place] MAIN [facility] SERVICES [address] [place], [place], [address] Phone: [phone]   Fax:  [phone]  Name: Sabrina Montoya MRN: [id number] Date of Birth: 29-Nov-1940

## 2019-01-21 ENCOUNTER — Ambulatory Visit: Payer: Medicare Other | Admitting: Physical Therapy

## 2019-02-14 ENCOUNTER — Ambulatory Visit: Payer: Medicare Other

## 2019-02-20 ENCOUNTER — Ambulatory Visit: Payer: Medicare Other

## 2019-02-26 ENCOUNTER — Other Ambulatory Visit: Payer: Self-pay | Admitting: Internal Medicine

## 2019-03-24 ENCOUNTER — Telehealth: Payer: Self-pay | Admitting: Physical Therapy

## 2019-03-24 ENCOUNTER — Encounter: Payer: Self-pay | Admitting: Physical Therapy

## 2019-03-24 DIAGNOSIS — R278 Other lack of coordination: Secondary | ICD-10-CM

## 2019-03-24 DIAGNOSIS — M6281 Muscle weakness (generalized): Secondary | ICD-10-CM

## 2019-03-24 DIAGNOSIS — R29898 Other symptoms and signs involving the musculoskeletal system: Secondary | ICD-10-CM

## 2019-03-24 NOTE — Telephone Encounter (Signed)
DPT called pt to f/u since closure of clinic COVID pandemic. Pt is doing well with exercises and ready to d/c.

## 2019-03-24 NOTE — Therapy (Signed)
[facility] MAIN REHAB SERVICES [address] [place], [place], [address] Phone: [phone]   Fax:  [phone]  Patient Details  Name: Sabrina Montoya MRN: [id number] Date of Birth: 11/19/1940 Referring Provider:  [place]  Encounter Date: 03/24/2019   Discharge Summary   Pt has achieved 3/5 goals and partially met her remaining 2 goals. Pt is IND in managing her LBP and takes breaks with household chores and remains compliant with HEP. Pt demo'd significantly decreased back mm tightness, increased hip mobility, hip strength, and decreased scar restrictions. Pt demo'd increased deep core strength and balance. Pt repoted she was able to put in firepit without back pain!  Pt is ready for d/c at this time.   PT Long Term Goals - 01/10/19 1403      PT LONG TERM GOAL #1   Title  Pt will decrease her PFDI score from 58% to < 30% in order to restore pelvic floor function ( 1/23: 27%)     Time  12    Period  Weeks    Status  Achieved      PT LONG TERM GOAL #2   Title  Pt will decrease her ODI score from 26% to < 16% in order to return to ADLs with less back pain ( 1/21:  23%)     Time  8    Period  Weeks    Status  Partially Met      PT LONG TERM GOAL #3   Title  Pt will demo decreased scar restrictions at sternum, upper/lower quadrant of abdomen, and LBP in order to optimize ROM of deep core mm and progress with deep core exercises     Time  4    Period  Weeks    Status  Achieved      PT LONG TERM GOAL #4   Title  Pt will demo proper body mechanics in functional activities ( sit to stand, log rolling out of bed, gardening, floor<> stand, household chores ) and fitness exercises in order to minimize worsening of prolapse.     Time  6    Period  Weeks    Status  Achieved      PT LONG TERM GOAL #5   Title  Pt will demo IND with proper technique and alignment with seated yoga poses and standing poses with chair in order to maintain flexibility for health  and wellness    Time  2    Period  Weeks    Status Partially Met   Target Date  01/24/19         Yeung,Shin Yiing ,PT, DPT, E-RYT  03/24/2019, 3:08 PM  [facility] MAIN REHAB SERVICES [address] , [place], [address] Phone: [phone]   Fax:  [phone] 

## 2019-04-23 ENCOUNTER — Telehealth: Payer: Self-pay | Admitting: Internal Medicine

## 2019-04-23 DIAGNOSIS — N183 Chronic kidney disease, stage 3 unspecified: Secondary | ICD-10-CM

## 2019-04-23 DIAGNOSIS — E1122 Type 2 diabetes mellitus with diabetic chronic kidney disease: Secondary | ICD-10-CM

## 2019-04-23 DIAGNOSIS — E7800 Pure hypercholesterolemia, unspecified: Secondary | ICD-10-CM

## 2019-04-23 NOTE — Telephone Encounter (Signed)
-----   Message from Dia Crawford, LPN sent at 1/63/8453  6:38 PM EDT ----- Regarding: Lab request Pt requests labs drawn for kidneys.  Has f/u with you on 6/22.   Thanks, Denisa

## 2019-04-23 NOTE — Telephone Encounter (Signed)
She needs a heck of a lot more than that!  fasting labs ad a1c ordered

## 2019-04-24 ENCOUNTER — Telehealth: Payer: Self-pay

## 2019-04-24 ENCOUNTER — Ambulatory Visit (INDEPENDENT_AMBULATORY_CARE_PROVIDER_SITE_OTHER): Payer: Medicare Other

## 2019-04-24 ENCOUNTER — Other Ambulatory Visit: Payer: Self-pay

## 2019-04-24 DIAGNOSIS — Z0000 Encounter for general adult medical examination without abnormal findings: Secondary | ICD-10-CM

## 2019-04-24 NOTE — Telephone Encounter (Signed)
Yes those supplements are ok to take . Please defer any more questions  until her appointment  Unless they are serious

## 2019-04-24 NOTE — Telephone Encounter (Signed)
Patient called to ask if ok for her to begin taking tumeric, collagen peptide dietary supplement and krill oil? Patient has labs 6/26 and follow up with pcp 6/29.  Will follow with phone call as appropriate.

## 2019-04-24 NOTE — Patient Instructions (Addendum)
  Sabrina Montoya , Thank you for taking time to come for your Medicare Wellness Visit. I appreciate your ongoing commitment to your health goals. Please review the following plan we discussed and let me know if I can assist you in the future.   These are the goals we discussed: Goals      Patient Stated   . Increase physical activity (pt-stated)     Use stepper and row style exercise equipment for more exercise       This is a list of the screening recommended for you and due dates:  Health Maintenance  Topic Date Due  . Tetanus Vaccine  05/27/1961  . Complete foot exam   02/09/2018  . Eye exam for diabetics  10/16/2018  . Urine Protein Check  04/19/2019  . Hemoglobin A1C  04/24/2019  . Flu Shot  06/07/2019  . DEXA scan (bone density measurement)  Completed  . Pneumonia vaccines  Completed

## 2019-04-24 NOTE — Progress Notes (Addendum)
Subjective:   Sabrina Montoya is a 78 y.o. female who presents for Medicare Annual (Subsequent) preventive examination.  Review of Systems:  No ROS.  Medicare Wellness Virtual Visit.  Visual/audio telehealth visit, UTA vital signs.   See social history for additional risk factors.   Cardiac Risk Factors include: advanced age (>52men, >15 women);hypertension;diabetes mellitus     Objective:     Vitals: There were no vitals taken for this visit.  There is no height or weight on file to calculate BMI.  Advanced Directives 04/24/2019 08/05/2018 02/12/2018 08/15/2017 03/14/2017 03/09/2017 02/08/2017  Does Patient Have a Medical Advance Directive? No No No No No No No  Type of Advance Directive - - - - - - -  Copy of Healthcare Power of Attorney in Chart? - - - - - - -  Would patient like information on creating a medical advance directive? No - Patient declined - No - Patient declined - Yes (MAU/Ambulatory/Procedural Areas - Information given) - Yes (MAU/Ambulatory/Procedural Areas - Information given)    Tobacco Social History   Tobacco Use  Smoking Status Former Smoker  . Packs/day: 0.50  . Years: 30.00  . Pack years: 15.00  . Types: Cigarettes  . Quit date: 04/01/2012  . Years since quitting: 7.0  Smokeless Tobacco Never Used  Tobacco Comment   quit for 2 years after sinus infection and 6 months after heart surgery     Counseling given: Not Answered Comment: quit for 2 years after sinus infection and 6 months after heart surgery   Clinical Intake:  Pre-visit preparation completed: Yes        Diabetes: Yes  How often do you need to have someone help you when you read instructions, pamphlets, or other written materials from your doctor or pharmacy?: 1 - Never  Interpreter Needed?: No     Past Medical History:  Diagnosis Date  . Abdominal aortic aneurysm without mention of rupture    infrarenal, stable, folllowed by Leotis Pain  . Acute posthemorrhagic anemia   .  Arthritis   . B12 deficiency   . CAD (coronary artery disease), autologous vein bypass graft   . Cardiac dysrhythmia, unspecified   . Colon polyp   . Depression   . Diverticulitis of colon   . Dizziness    DUE TO MEDICINES  . Dysrhythmia   . GERD (gastroesophageal reflux disease)   . HOH (hard of hearing)   . Hyperlipidemia   . Hypertension    pt denies. placed on meds after CABG  . IBS (irritable bowel syndrome)   . Neuropathy   . Peripheral vascular disease ([place])    s/p CEA   . Reflux esophagitis   . Rheumatic fever    possible at age 38  . Sliding hiatal hernia   . Tobacco abuse   . Tobacco abuse    Past Surgical History:  Procedure Laterality Date  . ABDOMINAL SURGERY  1976   for pain secondary to scar tissue, s/p apply  . APPENDECTOMY  1974  . BACK SURGERY    . CAROTID ENDARTERECTOMY    . CATARACT EXTRACTION W/PHACO Right 10/18/2016   Procedure: CATARACT EXTRACTION PHACO AND INTRAOCULAR LENS PLACEMENT (IOC);  Surgeon: Estill Cotta, MD;  Location: [facility] ORS;  Service: Ophthalmology;  Laterality: Right;  Lot # [id number] H [place]: 01:04.0 AP%: 24.4 CDE: 30.44  . CATARACT EXTRACTION W/PHACO Left 03/14/2017   Procedure: CATARACT EXTRACTION PHACO AND INTRAOCULAR LENS PLACEMENT ([place])  Left;  Surgeon: Leandrew Koyanagi, MD;  Location: [place];  Service: Ophthalmology;  Laterality: Left;  . CEA     Carotid stenosis found during workup for dysphagia,  [facility]  . 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 CHOLECYSTECTOMY  2002   Dr. Pat Patrick  . CLOSED MANIPULATION SHOULDER     left shoulder post physical therapy, Right shoulder redo (toggle bolts)  . COLONOSCOPY  09/01/14  . coronary angiography  01/2011   one occluded artery with collateralization, other arteries patent  . CORONARY ARTERY BYPASS GRAFT  2006   s/p triple bypass surgery, [facility]  . heart catherization  2016  . HERNIA REPAIR  may 2011   Dr.  Pat Patrick  . hernia repair    . LUMBAR DISC SURGERY  1971   L5, unssuccessful, fusion in 1983 successful (2 lumbar)  . PARTIAL HYSTERECTOMY    . ROTATOR CUFF REPAIR  2002   right shoulder Dr. Nadean Corwin, [facility] in [place]  . VASCULAR SURGERY     Family History  Problem Relation Age of Onset  . Cancer Sister        cervical cancer  . Diabetes Sister   . Heart disease Brother        coronary artery disease  . Other Brother        suicide  . Diabetes Mother   . Heart disease Mother   . Diabetes Sister   . Other Father        suicide  . Cancer Sister        breast  . Other Other        colon resection due to inflammation -nephew  . Bladder Cancer Neg Hx   . Kidney cancer Neg Hx    Social History   Socioeconomic History  . Marital status: Widowed    Spouse name: Not on file  . Number of children: Not on file  . Years of education: Not on file  . Highest education level: Not on file  Occupational History  . Not on file  Social Needs  . Financial resource strain: Not hard at all  . Food insecurity    Worry: Never true    Inability: Never true  . Transportation needs    Medical: No    Non-medical: No  Tobacco Use  . Smoking status: Former Smoker    Packs/day: 0.50    Years: 30.00    Pack years: 15.00    Types: Cigarettes    Quit date: 04/01/2012    Years since quitting: 7.0  . Smokeless tobacco: Never Used  . Tobacco comment: quit for 2 years after sinus infection and 6 months after heart surgery  Substance and Sexual Activity  . Alcohol use: No  . Drug use: No  . Sexual activity: Not Currently  Lifestyle  . Physical activity    Days per week: Not on file    Minutes per session: Not on file  . Stress: Not at all  Relationships  . Social Herbalist on phone: Not on file    Gets together: Not on file    Attends religious service: Not on file    Active member of club or organization: Not on file    Attends meetings of clubs or organizations:  Not on file    Relationship status: Not on file  Other Topics Concern  . Not on file  Social History Narrative   Widowed  Outpatient Encounter Medications as of 04/24/2019  Medication Sig  . aspirin 81 MG tablet Take 81 mg by mouth daily.  . cholecalciferol (VITAMIN D) 1000 UNITS tablet Take 1,000 Units by mouth daily.  . Cyanocobalamin (VITAMIN B12 PO) Take 1 Dose by mouth daily. GUMMY CHEWS  . isosorbide mononitrate (IMDUR) 30 MG 24 hr tablet Take 30 mg by mouth daily.  . Multiple Vitamins-Minerals (CENTRUM SILVER 50+WOMEN PO) Take 1 Dose by mouth daily.  . Multiple Vitamins-Minerals (PRESERVISION AREDS 2 PO) Take by mouth 2 (two) times daily.  Marland Kitchen omeprazole (PRILOSEC) 20 MG capsule TAKE ONE CAPSULE BY MOUTH EVERY DAY  . rosuvastatin (CRESTOR) 40 MG tablet Take 1 tablet (40 mg total) by mouth daily.  Marland Kitchen ketoconazole (NIZORAL) 2 % cream   . [DISCONTINUED] buPROPion (WELLBUTRIN XL) 150 MG 24 hr tablet TAKE 1 TABLET BY MOUTH ONCE EVERY MORNING (Patient not taking: Reported on 04/24/2019)  . [DISCONTINUED] conjugated estrogens (PREMARIN) vaginal cream Place 1 Applicatorful vaginally daily. Use pea sized amount M-W-Fr before bedtime (Patient not taking: Reported on 04/24/2019)  . [DISCONTINUED] dicyclomine (BENTYL) 20 MG tablet Take 1 tablet (20 mg total) by mouth 2 (two) times daily. (Patient not taking: Reported on 04/24/2019)  . [DISCONTINUED] metoprolol tartrate (LOPRESSOR) 25 MG tablet TAKE 1/2 TABLET BY MOUTH TWICE DAILY (Patient not taking: Reported on 04/24/2019)  . [DISCONTINUED] metroNIDAZOLE (METROGEL) 0.75 % gel   . [DISCONTINUED] triamcinolone cream (KENALOG) 0.1 %    No facility-administered encounter medications on file as of 04/24/2019.     Activities of Daily Living In your present state of health, do you have any difficulty performing the following activities: 04/24/2019  Hearing? N  Vision? N  Difficulty concentrating or making decisions? N  Walking or climbing stairs? N   Dressing or bathing? N  Doing errands, shopping? N  Preparing Food and eating ? N  Using the Toilet? N  In the past six months, have you accidently leaked urine? N  Do you have problems with loss of bowel control? N  Managing your Medications? N  Managing your Finances? Y  Comment Daughter assists  Housekeeping or managing your Housekeeping? N  Some recent data might be hidden    Patient Care Team: Crecencio Mc, MD as PCP - General (Internal Medicine) Crecencio Mc, MD (Internal Medicine) Bary Castilla Forest Gleason, MD (General Surgery)    Assessment:   This is a routine wellness examination for Loriene.  I connected with patient 04/25/19 at 10:30 AM EDT by a video/audio enabled telemedicine application and verified that I am speaking with the correct person using two identifiers. Patient stated full name and DOB. Patient gave permission to continue with virtual visit. Patient's location was at home and Nurse's location was at [place] office.   Health Screenings  Mammogram - 06/2018 Colonoscopy - 08/2014 Bone Density - 04/2011 Glaucoma -none Hearing -demonstrates normal hearing during visit. Hemoglobin A1C - 10/2018 Cholesterol - 10/2018 Dental- UTD Vision- visits within the last 12 months.  Social  Alcohol intake - no       Smoking history- former  Smokers in home? none Illicit drug use? none Exercise - active at home Diet - regular Sexually Active -not currently BMI- discussed the importance of a healthy diet, water intake and the benefits of aerobic exercise.  Educational material provided.   Safety  Patient feels safe at home- yes Patient does have smoke detectors at home- yes Patient does wear sunscreen or protective clothing when in direct sunlight -yes  Patient does wear seat belt when in a moving vehicle -yes Patient drives- yes  [id number] precautions and sickness symptoms discussed.   Activities of Daily Living Patient denies needing assistance with: driving,  household chores, feeding themselves, getting from bed to chair, getting to the toilet, bathing/showering, dressing, managing money, or preparing meals.  No new identified risk were noted.    Depression Screen Patient denies losing interest in daily life, feeling hopeless, or crying easily over simple problems. States she is doing so much better.  Medication-taking as directed and without issues.   Fall Screen Patient denies being afraid of falling or falling in the last year.   Memory Screen Patient is alert.  Patient denies difficulty focusing, concentrating or misplacing items. Correctly identified the president of the [place], season and recall. Patient likes to read, for brain stimulation.  Immunizations The following Immunizations were discussed: Influenza, shingles, pneumonia, and tetanus.   Other Providers Patient Care Team: Crecencio Mc, MD as PCP - General (Internal Medicine) Crecencio Mc, MD (Internal Medicine) Bary Castilla Forest Gleason, MD (General Surgery)  Exercise Activities and Dietary recommendations Current Exercise Habits: The patient does not participate in regular exercise at present  Goals      Patient Stated   . Increase physical activity (pt-stated)     Use stepper and row style exercise equipment for more exercise       Fall Risk Fall Risk  04/24/2019 02/12/2018 08/02/2017 02/08/2017 06/06/2016  Falls in the past year? 0 No No No Yes  Number falls in past yr: - - - - 1  Comment - - - - tripped and fell  Injury with Fall? - - - - Yes  Comment - - - - sprained ankle and hit ribs but no fracture  Risk for fall due to : - - - - History of fall(s)  Follow up - - - - -   Depression Screen PHQ 2/9 Scores 04/24/2019 02/12/2018 08/02/2017 02/08/2017  PHQ - 2 Score 0 0 0 0     Cognitive Function MMSE - Mini Mental State Exam 02/02/2016  Orientation to time 5  Orientation to Place 5  Registration 3  Attention/ Calculation 5  Recall 3  Language- name 2 objects 2   Language- repeat 1  Language- follow 3 step command 3  Language- read & follow direction 1  Write a sentence 1  Copy design 1  Total score 30     6CIT Screen 04/24/2019 02/12/2018 02/08/2017  What Year? 0 points 0 points 0 points  What month? 0 points 0 points 0 points  What time? 0 points 0 points 0 points  Count back from 20 0 points 0 points 0 points  Months in reverse 0 points 0 points 0 points  Repeat phrase - 0 points 0 points  Total Score - 0 0    Immunization History  Administered Date(s) Administered  . Influenza Split 10/10/2011, 08/26/2012  . Influenza, High Dose Seasonal PF 08/16/2016, 11/09/2017, 09/06/2018  . Influenza,inj,Quad PF,6+ Mos 07/25/2013, 07/28/2014, 08/13/2015  . Influenza-Unspecified 08/06/2012  . Pneumococcal Conjugate-13 08/26/2014  . Pneumococcal Polysaccharide-23 09/02/2010   Screening Tests Health Maintenance  Topic Date Due  . TETANUS/TDAP  05/27/1960  . FOOT EXAM  02/09/2018  . OPHTHALMOLOGY EXAM  10/16/2018  . URINE MICROALBUMIN  04/19/2019  . HEMOGLOBIN A1C  04/24/2019  . INFLUENZA VACCINE  06/07/2019  . DEXA SCAN  Completed  . PNA vac Low Risk Adult  Completed  Plan:   End of life planning; Advanced aging; Advanced directives discussed.  No HCPOA/Living Will.  Additional information declined at this time.  I have personally reviewed and noted the following in the patient's chart:   . Medical and social history . Use of alcohol, tobacco or illicit drugs  . Current medications and supplements . Functional ability and status . Nutritional status . Physical activity . Advanced directives . List of other physicians . Hospitalizations, surgeries, and ER visits in previous 12 months . Vitals . Screenings to include cognitive, depression, and falls . Referrals and appointments  In addition, I have reviewed and discussed with patient certain preventive protocols, quality metrics, and best practice recommendations. A written  personalized care plan for preventive services as well as general preventive health recommendations were provided to patient.     OBrien-Blaney, Fabion Gatson L, LPN  [id number]    I have reviewed the above information and agree with above.   Deborra Medina, MD

## 2019-04-28 ENCOUNTER — Ambulatory Visit: Payer: Medicare Other | Admitting: Internal Medicine

## 2019-04-29 ENCOUNTER — Ambulatory Visit: Payer: Self-pay | Admitting: Internal Medicine

## 2019-04-29 LAB — HM DIABETES EYE EXAM

## 2019-04-29 NOTE — Telephone Encounter (Signed)
Pt. Reports she had a painter in her home 2 weeks ago today, that has symptoms of COVID 19 and is being tested tomorrow.Pt. reports she does not have any symptoms. Concerned because she is around other family members. Would like to know what Dr. Derrel Nip thinks. Please advise pt. If no answer, leave a message.  Answer Assessment - Initial Assessment Questions 1. CLOSE CONTACT: "Who is the person with the confirmed or suspected COVID-19 infection that you were exposed to?"     Painter in her home 2. PLACE of CONTACT: "Where were you when you were exposed to COVID-19?" (e.g., home, school, medical waiting room; which city?)     Home 3. TYPE of CONTACT: "How much contact was there?" (e.g., sitting next to, live in same house, work in same office, same building)     Worked in her home 4. DURATION of CONTACT: "How long were you in contact with the COVID-19 patient?" (e.g., a few seconds, passed by person, a few minutes, live with the patient)     Last contact was 2 weeks ago 5. DATE of CONTACT: "When did you have contact with a COVID-19 patient?" (e.g., how many days ago)     2 weeks ago 6. TRAVEL: "Have you traveled out of the country recently?" If so, "When and where?"     * Also ask about out-of-state travel, since the CDC has identified some high-risk cities for community spread in the [place].     * Note: Travel becomes less relevant if there is widespread community transmission where the patient lives.     No 7. COMMUNITY SPREAD: "Are there lots of cases of COVID-19 (community spread) where you live?" (See public health department website, if unsure)       Yes 8. SYMPTOMS: "Do you have any symptoms?" (e.g., fever, cough, breathing difficulty)     No 9. PREGNANCY OR POSTPARTUM: "Is there any chance you are pregnant?" "When was your last menstrual period?" "Did you deliver in the last 2 weeks?"     No 10. HIGH RISK: "Do you have any heart or lung problems? Do you have a weak immune system?" (e.g., CHF,  COPD, asthma, HIV positive, chemotherapy, renal failure, diabetes mellitus, sickle cell anemia)       Heart surgery in the past  Protocols used: CORONAVIRUS (COVID-19) EXPOSURE-A-AH

## 2019-04-29 NOTE — Telephone Encounter (Signed)
Spoke with pt and informed her that she would need to self isolate until the person that she was in contact with gets his results back. Pt's lab appt and office visit appt with Dr. Derrel Nip this week has been canceled and I advised the pt that as soon as she got the results back to give [place] a call so we can reschedule. Pt gave a verbal understanding.

## 2019-04-29 NOTE — Telephone Encounter (Signed)
Where would you like for me to work pt in or do you think she will be okay to be scheduled on Thursday? Pt is not having any symptoms and doesn't know if the painter that was at her house 2 weeks ago is positive or not but he is having symptoms.

## 2019-04-29 NOTE — Telephone Encounter (Signed)
She should self isolate until she finds out what his test result are

## 2019-05-02 ENCOUNTER — Other Ambulatory Visit: Payer: Medicare Other

## 2019-05-05 ENCOUNTER — Encounter: Payer: Medicare Other | Admitting: Internal Medicine

## 2019-05-05 ENCOUNTER — Other Ambulatory Visit: Payer: Self-pay

## 2019-05-05 ENCOUNTER — Encounter: Payer: Self-pay | Admitting: Internal Medicine

## 2019-05-06 DIAGNOSIS — H353131 Nonexudative age-related macular degeneration, bilateral, early dry stage: Secondary | ICD-10-CM | POA: Diagnosis not present

## 2019-05-08 ENCOUNTER — Telehealth: Payer: Self-pay

## 2019-05-08 NOTE — Telephone Encounter (Signed)
Copied from [place]. Topic: General - Other >> May 07, 2019  4:25 PM Keene Breath wrote: Reason for CRM: Patient needs to know what she needs to do to get rescheduled since she is not in quarantine any longer.  CB# [phone]

## 2019-05-20 ENCOUNTER — Other Ambulatory Visit: Payer: Self-pay

## 2019-05-20 ENCOUNTER — Other Ambulatory Visit (INDEPENDENT_AMBULATORY_CARE_PROVIDER_SITE_OTHER): Payer: Medicare Other

## 2019-05-20 DIAGNOSIS — N183 Chronic kidney disease, stage 3 unspecified: Secondary | ICD-10-CM

## 2019-05-20 DIAGNOSIS — E1122 Type 2 diabetes mellitus with diabetic chronic kidney disease: Secondary | ICD-10-CM | POA: Diagnosis not present

## 2019-05-20 DIAGNOSIS — I129 Hypertensive chronic kidney disease with stage 1 through stage 4 chronic kidney disease, or unspecified chronic kidney disease: Secondary | ICD-10-CM | POA: Diagnosis not present

## 2019-05-20 DIAGNOSIS — E7800 Pure hypercholesterolemia, unspecified: Secondary | ICD-10-CM | POA: Diagnosis not present

## 2019-05-20 LAB — LIPID PANEL
Cholesterol: 119 mg/dL (ref 0–200)
HDL: 40.8 mg/dL (ref >39.00)
LDL Cholesterol: 57 mg/dL (ref 0–99)
NonHDL: 77.72
Total CHOL/HDL Ratio: 3
Triglycerides: 106 mg/dL (ref 0.0–149.0)
VLDL: 21.2 mg/dL (ref 0.0–40.0)

## 2019-05-20 LAB — COMPREHENSIVE METABOLIC PANEL
ALT: 17 U/L (ref 0–35)
AST: 33 U/L (ref 0–37)
Albumin: 4.8 g/dL (ref 3.5–5.2)
Alkaline Phosphatase: 73 U/L (ref 39–117)
BUN: 25 mg/dL — ABNORMAL HIGH (ref 6–23)
CO2: 29 mEq/L (ref 19–32)
Calcium: 9.5 mg/dL (ref 8.4–10.5)
Chloride: 106 mEq/L (ref 96–112)
Creatinine, Ser: 1.03 mg/dL (ref 0.40–1.20)
GFR: 51.96 mL/min — ABNORMAL LOW (ref >60.00)
Glucose, Bld: 124 mg/dL — ABNORMAL HIGH (ref 70–99)
Potassium: 4.7 mEq/L (ref 3.5–5.1)
Sodium: 143 mEq/L (ref 135–145)
Total Bilirubin: 0.4 mg/dL (ref 0.2–1.2)
Total Protein: 6.9 g/dL (ref 6.0–8.3)

## 2019-05-20 LAB — MICROALBUMIN / CREATININE URINE RATIO
Creatinine,U: 66.1 mg/dL
Microalb Creat Ratio: 2.3 mg/g (ref 0.0–30.0)
Microalb, Ur: 1.6 mg/dL (ref 0.0–1.9)

## 2019-05-20 LAB — HEMOGLOBIN A1C: Hgb A1c MFr Bld: 6.7 % — ABNORMAL HIGH (ref 4.6–6.5)

## 2019-05-23 ENCOUNTER — Encounter: Payer: Self-pay | Admitting: Internal Medicine

## 2019-05-23 ENCOUNTER — Encounter: Payer: Medicare Other | Admitting: Internal Medicine

## 2019-05-23 ENCOUNTER — Other Ambulatory Visit: Payer: Self-pay

## 2019-05-29 ENCOUNTER — Encounter: Payer: Self-pay | Admitting: Internal Medicine

## 2019-05-29 ENCOUNTER — Other Ambulatory Visit: Payer: Self-pay

## 2019-05-29 ENCOUNTER — Ambulatory Visit (INDEPENDENT_AMBULATORY_CARE_PROVIDER_SITE_OTHER): Payer: Medicare Other | Admitting: Internal Medicine

## 2019-05-29 VITALS — Ht 64.0 in | Wt 148.0 lb

## 2019-05-29 DIAGNOSIS — F32A Depression, unspecified: Secondary | ICD-10-CM

## 2019-05-29 DIAGNOSIS — E1140 Type 2 diabetes mellitus with diabetic neuropathy, unspecified: Secondary | ICD-10-CM | POA: Diagnosis not present

## 2019-05-29 DIAGNOSIS — E7800 Pure hypercholesterolemia, unspecified: Secondary | ICD-10-CM

## 2019-05-29 DIAGNOSIS — G629 Polyneuropathy, unspecified: Secondary | ICD-10-CM | POA: Diagnosis not present

## 2019-05-29 DIAGNOSIS — I499 Cardiac arrhythmia, unspecified: Secondary | ICD-10-CM

## 2019-05-29 DIAGNOSIS — F450 Somatization disorder: Secondary | ICD-10-CM | POA: Diagnosis not present

## 2019-05-29 DIAGNOSIS — F329 Major depressive disorder, single episode, unspecified: Secondary | ICD-10-CM

## 2019-05-29 NOTE — Assessment & Plan Note (Signed)
checking thyroid , B12 and RPR

## 2019-05-29 NOTE — Progress Notes (Signed)
Virtual Visit via [email or web address]  This visit type was conducted due to national recommendations for restrictions regarding the COVID-19 pandemic (e.g. social distancing).  This format is felt to be most appropriate for this patient at this time.  All issues noted in this document were discussed and addressed.  No physical exam was performed (except for noted visual exam findings with Video Visits).   I connected with@ on 05/29/19 at 12:00 PM EDT by a video enabled telemedicine application or telephone and verified that I am speaking with the correct person using two identifiers. Location patient: home Location provider: work or home office Persons participating in the virtual visit: patient, provider  I discussed the limitations, risks, security and privacy concerns of performing an evaluation and management service by telephone and the availability of in person appointments. I also discussed with the patient that there may be a patient responsible charge related to this service. The patient expressed understanding and agreed to proceed.   Reason for visit: follow up on chronic conditions  HPI:  78 yr old female with type 2 DM and CKD ,  hyperlipidemia and CAD, PAD with aortic aneurysm and hypertension and MDD presents for follow up  6 month follow up on diabetes. .  Patient is following a low glycemic index diet and taking all prescribed medications regularly without side effects.  Fasting sugars have been less than 140 most of the time and post prandials have been under 160 except on rare occasions. Patient is exercising about 5 times per week and not intentionally trying to lose weight .  Patient has had an eye exam in the last 12 months and checks feet regularly for signs of infection.  Patient does not walk barefoot outside but she has developed a sensation of  numbness and burning in both fore feet. Patient is up to date on all recommended vaccinations  .  Back pain :  She remains physically very  active in the yard and at home  She notes that by 5 pm she has low back pain that is moderate in intensity and forces her to rest for an hour in her recliner with a heating pad.  The pain resolves with rest,  It does not radiate to either leg.  It is aggravated by stopping or bending slightly at the waist   She no longer feels depressed . She wakes up daily with motivation and energy and has completed multiple DIY projects.  She has remmained in close contact with family.  Intermittent episodes of transient alteration , almost presyncope.  She describes episodes lasting several seconds during which time she feels unable to draw a breath for 2-3 seconds .  She denies vision changes and chest pain , . The episodes resolve spontaneously with a prolonged exhalation,  But no cough,  She has checked BP after episodes and pulse is regular and BP is 315 systolic (high for her).  Has not checked BS during episodes.  Has not discussed with cardiologist   The patient has no signs or symptoms of COVID 19 infection (fever, cough, sore throat  or shortness of breath beyond what is typical for patient).  Patient denies contact with other persons with the above mentioned symptoms or with anyone confirmed to have COVID 19   ROS: Patient denies headache, fevers, malaise, unintentional weight loss, skin rash, eye pain, sinus congestion and sinus pain, sore throat, dysphagia,  hemoptysis , cough, dyspnea, wheezing, chest pain, palpitations, orthopnea, edema, abdominal pain, nausea, melena, diarrhea,  constipation, flank pain, dysuria, hematuria, urinary  Frequency, nocturia, numbness, tingling, seizures,  Focal weakness, Loss of consciousness,  Tremor, insomnia, depression, anxiety, and suicidal ideation.     Past Medical History:  Diagnosis Date  . Abdominal aortic aneurysm without mention of rupture    infrarenal, stable, folllowed by Leotis Pain  . Acute posthemorrhagic anemia   . Arthritis   . B12 deficiency   . CAD  (coronary artery disease), autologous vein bypass graft   . Cardiac dysrhythmia, unspecified   . Colon polyp   . Depression   . Diverticulitis of colon   . Dizziness    DUE TO MEDICINES  . Dysrhythmia   . GERD (gastroesophageal reflux disease)   . HOH (hard of hearing)   . Hyperlipidemia   . Hypertension    pt denies. placed on meds after CABG  . IBS (irritable bowel syndrome)   . Neuropathy   . Peripheral vascular disease ([place])    s/p CEA   . Reflux esophagitis   . Rheumatic fever    possible at age 16  . Sliding hiatal hernia   . Tobacco abuse   . Tobacco abuse     Past Surgical History:  Procedure Laterality Date  . ABDOMINAL SURGERY  1976   for pain secondary to scar tissue, s/p apply  . APPENDECTOMY  1974  . BACK SURGERY    . CAROTID ENDARTERECTOMY    . CATARACT EXTRACTION W/PHACO Right 10/18/2016   Procedure: CATARACT EXTRACTION PHACO AND INTRAOCULAR LENS PLACEMENT (IOC);  Surgeon: Estill Cotta, MD;  Location: [facility];  Service: Ophthalmology;  Laterality: Right;  Lot # [id number] H [place]: 01:04.0 AP%: 24.4 CDE: 30.44  . CATARACT EXTRACTION W/PHACO Left 03/14/2017   Procedure: CATARACT EXTRACTION PHACO AND INTRAOCULAR LENS PLACEMENT ([place])  Left;  Surgeon: Leandrew Koyanagi, MD;  Location: [place];  Service: Ophthalmology;  Laterality: Left;  . CEA     Carotid stenosis found during workup for dysphagia,  [facility]  . 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 CHOLECYSTECTOMY  2002   Dr. Pat Patrick  . CLOSED MANIPULATION SHOULDER     left shoulder post physical therapy, Right shoulder redo (toggle bolts)  . COLONOSCOPY  09/01/14  . coronary angiography  01/2011   one occluded artery with collateralization, other arteries patent  . CORONARY ARTERY BYPASS GRAFT  2006   s/p triple bypass surgery, [facility]  . heart catherization  2016  . HERNIA REPAIR  may 2011   Dr. Pat Patrick  . hernia repair    . LUMBAR  DISC SURGERY  1971   L5, unssuccessful, fusion in 1983 successful (2 lumbar)  . PARTIAL HYSTERECTOMY    . ROTATOR CUFF REPAIR  2002   right shoulder Dr. Nadean Corwin, [facility] in [place]  . VASCULAR SURGERY      Family History  Problem Relation Age of Onset  . Cancer Sister        cervical cancer  . Diabetes Sister   . Heart disease Brother        coronary artery disease  . Other Brother        suicide  . Diabetes Mother   . Heart disease Mother   . Diabetes Sister   . Other Father        suicide  . Cancer Sister        breast  . Other Other        colon  resection due to inflammation -nephew  . Bladder Cancer Neg Hx   . Kidney cancer Neg Hx     SOCIAL HX: widowed x 5 years.   reports that she quit smoking about 7 years ago. Her smoking use included cigarettes. She has a 15.00 pack-year smoking history. She has never used smokeless tobacco. She reports that she does not drink alcohol or use drugs.   Current Outpatient Medications:  .  aspirin 81 MG tablet, Take 81 mg by mouth daily., Disp: , Rfl:  .  cholecalciferol (VITAMIN D) 1000 UNITS tablet, Take 1,000 Units by mouth daily., Disp: , Rfl:  .  conjugated estrogens (PREMARIN) vaginal cream, Place vaginally., Disp: , Rfl:  .  Cyanocobalamin (VITAMIN B12 PO), Take 1 Dose by mouth daily. GUMMY CHEWS, Disp: , Rfl:  .  isosorbide mononitrate (IMDUR) 30 MG 24 hr tablet, Take 30 mg by mouth daily., Disp: , Rfl:  .  Multiple Vitamins-Minerals (CENTRUM SILVER 50+WOMEN PO), Take 1 Dose by mouth daily., Disp: , Rfl:  .  Multiple Vitamins-Minerals (PRESERVISION AREDS 2 PO), Take by mouth 2 (two) times daily., Disp: , Rfl:  .  omeprazole (PRILOSEC) 20 MG capsule, TAKE ONE CAPSULE BY MOUTH EVERY DAY, Disp: 90 capsule, Rfl: 1 .  rosuvastatin (CRESTOR) 40 MG tablet, Take 1 tablet (40 mg total) by mouth daily., Disp: 90 tablet, Rfl: 0  EXAM:  VITALS per patient if applicable:  GENERAL: alert, oriented, appears well and in no acute  distress  HEENT: atraumatic, conjunttiva clear, no obvious abnormalities on inspection of external nose and ears  NECK: normal movements of the head and neck  LUNGS: on inspection no signs of respiratory distress, breathing rate appears normal, no obvious gross SOB, gasping or wheezing  CV: no obvious cyanosis  MS: moves all visible extremities without noticeable abnormality  PSYCH/NEURO: pleasant and cooperative, no obvious depression or anxiety, speech and thought processing grossly intact  ASSESSMENT AND PLAN:  Depression with somatization She continues to maintain  an Improved outlook without medication   Hyperlipidemia LDL and triglycerides are at goal on current medications. She has no side effects and liver enzymes are normal. No changes today   Lab Results  Component Value Date   CHOL 119 05/20/2019   HDL 40.80 05/20/2019   LDLCALC 57 05/20/2019   LDLDIRECT 48.0 04/18/2018   TRIG 106.0 05/20/2019   CHOLHDL 3 05/20/2019     Arrhythmia, sinus node Suspected by current history of episodes of being unable to draw a breath for a few seconds, accompanied by light headedness/presyncope.  Suspect she is having sinus pauses. Advised to notify her cardiologist so she can have a monitor placed.  Controlled type 2 diabetes with neuropathy ([place]) Her diabetes remains  well-controlled on diet alone. Patient is up-to-date on eye exams and overdue for labs.  foot exam has not been done due to social distancing, but she is reporting a burning sensation in her toes bilateraly that is mild and not sleep disruptive .  patient has no history of microalbuminuria. Patient is tolerating statin therapy for CAD risk reduction and asa  for secondary prevention    Lab Results  Component Value Date   HGBA1C 6.7 (H) 05/20/2019   Lab Results  Component Value Date   MICROALBUR 1.6 05/20/2019   Lab Results  Component Value Date   CREATININE 1.03 05/20/2019     Neuropathy checking thyroid  , B12 and RPR     I discussed the assessment and treatment plan  with the patient. The patient was provided an opportunity to ask questions and all were answered. The patient agreed with the plan and demonstrated an understanding of the instructions.   The patient was advised to call back or seek an in-person evaluation if the symptoms worsen or if the condition fails to improve as anticipated.  I provided 25 minutes of non-face-to-face time during this encounter.   Crecencio Mc, MD

## 2019-05-29 NOTE — Assessment & Plan Note (Signed)
Suspected by current history of episodes of being unable to draw a breath for a few seconds, accompanied by light headedness/presyncope.  Suspect she is having sinus pauses. Advised to notify her cardiologist so she can have a monitor placed.

## 2019-05-29 NOTE — Assessment & Plan Note (Addendum)
She continues to maintain  an Improved outlook without medication

## 2019-05-29 NOTE — Assessment & Plan Note (Signed)
Her diabetes remains  well-controlled on diet alone. Patient is up-to-date on eye exams and overdue for labs.  foot exam has not been done due to social distancing, but she is reporting a burning sensation in her toes bilateraly that is mild and not sleep disruptive .  patient has no history of microalbuminuria. Patient is tolerating statin therapy for CAD risk reduction and asa  for secondary prevention    Lab Results  Component Value Date   HGBA1C 6.7 (H) 05/20/2019   Lab Results  Component Value Date   MICROALBUR 1.6 05/20/2019   Lab Results  Component Value Date   CREATININE 1.03 05/20/2019

## 2019-05-29 NOTE — Assessment & Plan Note (Signed)
LDL and triglycerides are at goal on current medications. She has no side effects and liver enzymes are normal. No changes today   Lab Results  Component Value Date   CHOL 119 05/20/2019   HDL 40.80 05/20/2019   LDLCALC 57 05/20/2019   LDLDIRECT 48.0 04/18/2018   TRIG 106.0 05/20/2019   CHOLHDL 3 05/20/2019

## 2019-06-02 ENCOUNTER — Other Ambulatory Visit: Payer: Self-pay

## 2019-06-02 ENCOUNTER — Other Ambulatory Visit (INDEPENDENT_AMBULATORY_CARE_PROVIDER_SITE_OTHER): Payer: Medicare Other

## 2019-06-02 DIAGNOSIS — G629 Polyneuropathy, unspecified: Secondary | ICD-10-CM

## 2019-06-02 DIAGNOSIS — E1140 Type 2 diabetes mellitus with diabetic neuropathy, unspecified: Secondary | ICD-10-CM | POA: Diagnosis not present

## 2019-06-02 LAB — VITAMIN B12: Vitamin B-12: >1500 pg/mL — ABNORMAL HIGH (ref 211–911)

## 2019-06-02 LAB — TSH: TSH: 1.64 u[IU]/mL (ref 0.35–4.50)

## 2019-06-03 LAB — RPR: RPR Ser Ql: NONREACTIVE

## 2019-06-04 ENCOUNTER — Other Ambulatory Visit: Payer: Self-pay | Admitting: Internal Medicine

## 2019-06-04 DIAGNOSIS — M85832 Other specified disorders of bone density and structure, left forearm: Secondary | ICD-10-CM

## 2019-06-04 NOTE — Progress Notes (Signed)
dxa  

## 2019-06-12 ENCOUNTER — Encounter: Payer: Self-pay | Admitting: Internal Medicine

## 2019-06-16 ENCOUNTER — Other Ambulatory Visit: Payer: Self-pay

## 2019-06-16 DIAGNOSIS — Z20822 Contact with and (suspected) exposure to covid-19: Secondary | ICD-10-CM

## 2019-06-17 LAB — NOVEL CORONAVIRUS, NAA: SARS-CoV-2, NAA: NOT DETECTED

## 2019-06-19 ENCOUNTER — Other Ambulatory Visit: Payer: Self-pay | Admitting: Internal Medicine

## 2019-06-19 DIAGNOSIS — Z1231 Encounter for screening mammogram for malignant neoplasm of breast: Secondary | ICD-10-CM

## 2019-07-17 ENCOUNTER — Telehealth: Payer: Self-pay | Admitting: Internal Medicine

## 2019-07-17 NOTE — Telephone Encounter (Signed)
Have not received form yet

## 2019-07-17 NOTE — Telephone Encounter (Signed)
Patient is calling regarding a bill. Patient thyroid was checked - Medicare will send the patient a form to state that Dr. Derrel Nip felt the lab was medically necessary.   Please advise [phone]

## 2019-07-29 ENCOUNTER — Encounter: Payer: Self-pay | Admitting: Internal Medicine

## 2019-07-29 NOTE — Telephone Encounter (Signed)
Pt called back in to follow up on request. Pt says that she has not received a form from insurance. Pt would like to know if provider is able to update and resubmit the codes for labs so that her insurance will cover?     Also pt would like to know if she is able to have the shingles shot? Pt is unsure of if/how her insurance will cover it?  Please assist.

## 2019-07-30 ENCOUNTER — Ambulatory Visit
Admission: RE | Admit: 2019-07-30 | Discharge: 2019-07-30 | Disposition: A | Discharge status: Home / Self Care | Payer: Medicare Other | Source: Ambulatory Visit | Attending: Internal Medicine | Admitting: Internal Medicine

## 2019-07-30 DIAGNOSIS — M810 Age-related osteoporosis without current pathological fracture: Secondary | ICD-10-CM | POA: Diagnosis not present

## 2019-07-30 DIAGNOSIS — Z1231 Encounter for screening mammogram for malignant neoplasm of breast: Secondary | ICD-10-CM | POA: Diagnosis not present

## 2019-07-30 DIAGNOSIS — M85832 Other specified disorders of bone density and structure, left forearm: Secondary | ICD-10-CM | POA: Diagnosis not present

## 2019-07-30 DIAGNOSIS — M85852 Other specified disorders of bone density and structure, left thigh: Secondary | ICD-10-CM | POA: Diagnosis not present

## 2019-07-30 NOTE — Telephone Encounter (Signed)
Form has been placed in red folder requesting a different diagnosis code for lab work that was not covered by insurance.

## 2019-07-30 NOTE — Telephone Encounter (Signed)
I did not receive a form for Sabrina Montoya,  For alternative diagnosis code .  Different patient

## 2019-07-30 NOTE — Telephone Encounter (Signed)
WHETHER OR NOT HER SHINGLES VACCINE WILL  BE COVERED IS NOT MY responsibility to figure out ,   So if there is someone in the office that can handle that, please route to them

## 2019-08-01 ENCOUNTER — Ambulatory Visit (INDEPENDENT_AMBULATORY_CARE_PROVIDER_SITE_OTHER): Payer: Medicare Other

## 2019-08-01 ENCOUNTER — Other Ambulatory Visit: Payer: Self-pay

## 2019-08-01 DIAGNOSIS — Z23 Encounter for immunization: Secondary | ICD-10-CM

## 2019-08-01 NOTE — Telephone Encounter (Signed)
Spoke with pt to let her know that I know her insurance will not cover for her to get the shingles vaccine here in the office that she will have to get it from a pharmacy. Pt stated that she called her insurance and they stated that she would have to pay $184 for each injection.

## 2019-09-01 ENCOUNTER — Encounter: Payer: Medicare Other | Admitting: Internal Medicine

## 2019-09-01 DIAGNOSIS — L821 Other seborrheic keratosis: Secondary | ICD-10-CM | POA: Diagnosis not present

## 2019-09-01 DIAGNOSIS — L570 Actinic keratosis: Secondary | ICD-10-CM | POA: Diagnosis not present

## 2019-09-01 DIAGNOSIS — D225 Melanocytic nevi of trunk: Secondary | ICD-10-CM | POA: Diagnosis not present

## 2019-09-01 DIAGNOSIS — D2271 Melanocytic nevi of right lower limb, including hip: Secondary | ICD-10-CM | POA: Diagnosis not present

## 2019-09-01 DIAGNOSIS — D1801 Hemangioma of skin and subcutaneous tissue: Secondary | ICD-10-CM | POA: Diagnosis not present

## 2019-09-01 DIAGNOSIS — Z85828 Personal history of other malignant neoplasm of skin: Secondary | ICD-10-CM | POA: Diagnosis not present

## 2019-09-01 DIAGNOSIS — L814 Other melanin hyperpigmentation: Secondary | ICD-10-CM | POA: Diagnosis not present

## 2019-09-01 DIAGNOSIS — L820 Inflamed seborrheic keratosis: Secondary | ICD-10-CM | POA: Diagnosis not present

## 2019-09-10 ENCOUNTER — Other Ambulatory Visit: Payer: Self-pay

## 2019-09-12 ENCOUNTER — Encounter: Payer: Self-pay | Admitting: Internal Medicine

## 2019-09-12 ENCOUNTER — Other Ambulatory Visit: Payer: Self-pay

## 2019-09-12 ENCOUNTER — Ambulatory Visit (INDEPENDENT_AMBULATORY_CARE_PROVIDER_SITE_OTHER): Payer: Medicare Other | Admitting: Internal Medicine

## 2019-09-12 VITALS — BP 138/68 | HR 85 | Temp 97.4°F | Ht 64.37 in | Wt 148.2 lb

## 2019-09-12 DIAGNOSIS — I714 Abdominal aortic aneurysm, without rupture, unspecified: Secondary | ICD-10-CM

## 2019-09-12 DIAGNOSIS — E7800 Pure hypercholesterolemia, unspecified: Secondary | ICD-10-CM | POA: Diagnosis not present

## 2019-09-12 DIAGNOSIS — E1140 Type 2 diabetes mellitus with diabetic neuropathy, unspecified: Secondary | ICD-10-CM

## 2019-09-12 DIAGNOSIS — N1831 Chronic kidney disease, stage 3a: Secondary | ICD-10-CM

## 2019-09-12 DIAGNOSIS — Z0000 Encounter for general adult medical examination without abnormal findings: Secondary | ICD-10-CM | POA: Diagnosis not present

## 2019-09-12 DIAGNOSIS — M792 Neuralgia and neuritis, unspecified: Secondary | ICD-10-CM

## 2019-09-12 DIAGNOSIS — F329 Major depressive disorder, single episode, unspecified: Secondary | ICD-10-CM | POA: Diagnosis not present

## 2019-09-12 DIAGNOSIS — F450 Somatization disorder: Secondary | ICD-10-CM

## 2019-09-12 DIAGNOSIS — M5412 Radiculopathy, cervical region: Secondary | ICD-10-CM | POA: Diagnosis not present

## 2019-09-12 DIAGNOSIS — F32A Depression, unspecified: Secondary | ICD-10-CM

## 2019-09-12 MED ORDER — ZOSTER VAC RECOMB ADJUVANTED 50 MCG/0.5ML IM SUSR: 0.5000 mL | Freq: Once | INTRAMUSCULAR | 1 refills | Status: AC

## 2019-09-12 NOTE — Patient Instructions (Signed)
I will make a referral to [facility]  Your left arm pain sounds like a pinched nerve is occurred somewhere.  I am referring you to Neurology for nerve conduction studies   Continue turmeric for arthritis pain . You can add up to 2000 mg of acetominophen (tylenol) every day safely  In divided doses (500 mg every 6 hours  Or 1000 mg every 12 hours.)   Health Maintenance After Age 77 After age 85, you are at a higher risk for certain long-term diseases and infections as well as injuries from falls. Falls are a major cause of broken bones and head injuries in people who are older than age 6. Getting regular preventive care can help to keep you healthy and well. Preventive care includes getting regular testing and making lifestyle changes as recommended by your health care provider. Talk with your health care provider about:  Which screenings and tests you should have. A screening is a test that checks for a disease when you have no symptoms.  A diet and exercise plan that is right for you. What should I know about screenings and tests to prevent falls? Screening and testing are the best ways to find a health problem early. Early diagnosis and treatment give you the best chance of managing medical conditions that are common after age 50. Certain conditions and lifestyle choices may make you more likely to have a fall. Your health care provider may recommend:  Regular vision checks. Poor vision and conditions such as cataracts can make you more likely to have a fall. If you wear glasses, make sure to get your prescription updated if your vision changes.  Medicine review. Work with your health care provider to regularly review all of the medicines you are taking, including over-the-counter medicines. Ask your health care provider about any side effects that may make you more likely to have a fall. Tell your health care provider if any medicines that you take make you feel dizzy or sleepy.   Osteoporosis screening. Osteoporosis is a condition that causes the bones to get weaker. This can make the bones weak and cause them to break more easily.  Blood pressure screening. Blood pressure changes and medicines to control blood pressure can make you feel dizzy.  Strength and balance checks. Your health care provider may recommend certain tests to check your strength and balance while standing, walking, or changing positions.  Foot health exam. Foot pain and numbness, as well as not wearing proper footwear, can make you more likely to have a fall.  Depression screening. You may be more likely to have a fall if you have a fear of falling, feel emotionally low, or feel unable to do activities that you used to do.  Alcohol use screening. Using too much alcohol can affect your balance and may make you more likely to have a fall. What actions can I take to lower my risk of falls? General instructions  Talk with your health care provider about your risks for falling. Tell your health care provider if: ? You fall. Be sure to tell your health care provider about all falls, even ones that seem minor. ? You feel dizzy, sleepy, or off-balance.  Take over-the-counter and prescription medicines only as told by your health care provider. These include any supplements.  Eat a healthy diet and maintain a healthy weight. A healthy diet includes low-fat dairy products, low-fat (lean) meats, and fiber from whole grains, beans, and lots of fruits and vegetables. Home  safety  Remove any tripping hazards, such as rugs, cords, and clutter.  Install safety equipment such as grab bars in bathrooms and safety rails on stairs.  Keep rooms and walkways well-lit. Activity   Follow a regular exercise program to stay fit. This will help you maintain your balance. Ask your health care provider what types of exercise are appropriate for you.  If you need a cane or walker, use it as recommended by your health  care provider.  Wear supportive shoes that have nonskid soles. Lifestyle  Do not drink alcohol if your health care provider tells you not to drink.  If you drink alcohol, limit how much you have: ? 0-1 drink a day for women. ? 0-2 drinks a day for men.  Be aware of how much alcohol is in your drink. In the [place] one drink equals one typical bottle of beer (12 oz), one-half glass of wine (5 oz), or one shot of hard liquor (1 oz).  Do not use any products that contain nicotine or tobacco, such as cigarettes and e-cigarettes. If you need help quitting, ask your health care provider. Summary  Having a healthy lifestyle and getting preventive care can help to protect your health and wellness after age 30.  Screening and testing are the best way to find a health problem early and help you avoid having a fall. Early diagnosis and treatment give you the best chance for managing medical conditions that are more common for people who are older than age 15.  Falls are a major cause of broken bones and head injuries in people who are older than age 69. Take precautions to prevent a fall at home.  Work with your health care provider to learn what changes you can make to improve your health and wellness and to prevent falls. This information is not intended to replace advice given to you by your health care provider. Make sure you discuss any questions you have with your health care provider. Document Released: 09/05/2017 Document Revised: 02/13/2019 Document Reviewed: 09/05/2017 Elsevier Patient Education  2020 [employer].

## 2019-09-12 NOTE — Progress Notes (Signed)
Patient ID: Sabrina Montoya, female    DOB: 08/30/1941  Age: 78 y.o. MRN: [id number]  The patient is here for follow up and management of chronic and acute problems.   The risk factors are reflected in the social history.  The roster of all physicians providing medical care to patient - is listed in the Snapshot section of the chart.  Activities of daily living:  The patient is 100% independent in all ADLs: dressing, toileting, feeding as well as independent mobility  Home safety : The patient has smoke detectors in the home. They wear seatbelts.  There are no firearms at home. There is no violence in the home.   There is no risks for hepatitis, STDs or HIV. There is no   history of blood transfusion. They have no travel history to infectious disease endemic areas of the world.  The patient has seen their dentist in the last six month. They have seen their eye doctor in the last year. They admit to slight hearing difficulty with regard to whispered voices and some television programs.  They have deferred audiologic testing in the last year.  They do not  have excessive sun exposure. Discussed the need for sun protection: hats, long sleeves and use of sunscreen if there is significant sun exposure.   Diet: the importance of a healthy diet is discussed. They do have a healthy diet.  The benefits of regular aerobic exercise were discussed. She walks 4 times per week ,  20 minutes.   Depression screen: there are no signs or vegative symptoms of depression- irritability, change in appetite, anhedonia, sadness/tearfullness.  Cognitive assessment: the patient manages all their financial and personal affairs and is actively engaged. They could relate day,date,year and events; recalled 2/3 objects at 3 minutes; performed clock-face test normally.  The following portions of the patient's history were reviewed and updated as appropriate: allergies, current medications, past family history, past medical  history,  past surgical history, past social history  and problem list.  Visual acuity was not assessed per patient preference since she has regular follow up with her ophthalmologist. Hearing and body mass index were assessed and reviewed.   During the course of the visit the patient was educated and counseled about appropriate screening and preventive services including : fall prevention , diabetes screening, nutrition counseling, colorectal cancer screening, and recommended immunizations.    CC: The primary encounter diagnosis was Radicular pain in left arm. Diagnoses of Depression with somatization, Pure hypercholesterolemia, Controlled type 2 diabetes with neuropathy ([employer]), AAA (abdominal aortic aneurysm) without rupture ([place]), Radiculopathy of cervical region, Preventative health care, and Stage 3a chronic kidney disease were also pertinent to this visit.  Left sided back pain, lateral to spine ,  Above SI joint  Started after working hard in the yard    arthrtis in hands getting worse.    Left shoulder pain with radiculopathy  to middle finger feels like an electric shock .  does not have chronic pain involving the  neck .  Has been intermittent until this week has been more frequent  History of ACDF surgery  In 2009 wakes up with sore posterior upper arm and shoulder   History of shoulder dislocation that was reduced under general anesthesia in 2003; Right TMJ pain for the past week radiates to scalp  Toes go numb a lot.  Notices it at night,  Can't feel the sheets.  All shoes feel heavy except for tennis .  Toes feel cold  all the time .  Has had epsiodes of sharp stabbing pan under the first MT head  HTN: home readings vary,  Occasionally < 110/70 but mostly < 130/80    History Sabrina Montoya has a past medical history of Abdominal aortic aneurysm without mention of rupture, Acute posthemorrhagic anemia, Arthritis, B12 deficiency, CAD (coronary artery disease), autologous vein bypass graft,  Cardiac dysrhythmia, unspecified, Colon polyp, Depression, Diverticulitis of colon, Dizziness, Dysrhythmia, GERD (gastroesophageal reflux disease), HOH (hard of hearing), Hyperlipidemia, Hypertension, IBS (irritable bowel syndrome), Neuropathy, Neuropathy (07/01/2013), Peripheral vascular disease ([place]), Reflux esophagitis, Rheumatic fever, Sliding hiatal hernia, Tobacco abuse, and Tobacco abuse.   She has a past surgical history that includes Closed manipulation shoulder; Rotator cuff repair (2002); CEA; Lumbar disc surgery (1971); Abdominal surgery (1976); Cervical disc surgery (2009); Hernia repair (may 2011); Coronary artery bypass graft (2006); Cholecystectomy (2002); coronary angiography (01/2011); hernia repair; Appendectomy (1974); Colonoscopy (09/01/14); heart catherization (2016); Back surgery; Vascular surgery; Carotid endarterectomy; Cataract extraction w/PHACO (Right, 10/18/2016); Cataract extraction w/PHACO (Left, 03/14/2017); and Partial hysterectomy.   Her family history includes Breast cancer in her sister; Cancer in her sister and sister; Diabetes in her mother, sister, and sister; Heart disease in her brother and mother; Other in her brother, father, and another family member.She reports that she quit smoking about 7 years ago. Her smoking use included cigarettes. She has a 15.00 pack-year smoking history. She has never used smokeless tobacco. She reports that she does not drink alcohol or use drugs.  Outpatient Medications Prior to Visit  Medication Sig Dispense Refill  . aspirin 81 MG tablet Take 81 mg by mouth daily.    Marland Kitchen conjugated estrogens (PREMARIN) vaginal cream Place vaginally.    . Cyanocobalamin (VITAMIN B12 PO) Take 1 Dose by mouth daily. GUMMY CHEWS; 3,000 mg    . isosorbide mononitrate (IMDUR) 30 MG 24 hr tablet Take 30 mg by mouth daily.    . Multiple Vitamins-Minerals (CENTRUM SILVER 50+WOMEN PO) Take 1 Dose by mouth daily.    . Multiple Vitamins-Minerals (PRESERVISION  AREDS 2 PO) Take by mouth 2 (two) times daily.    Marland Kitchen omeprazole (PRILOSEC) 20 MG capsule TAKE ONE CAPSULE BY MOUTH EVERY DAY 90 capsule 1  . rosuvastatin (CRESTOR) 40 MG tablet Take 1 tablet (40 mg total) by mouth daily. 90 tablet 0  . Turmeric (QC TUMERIC COMPLEX PO) Take 1 mg by mouth daily.    . cholecalciferol (VITAMIN D) 1000 UNITS tablet Take 1,000 Units by mouth daily.     No facility-administered medications prior to visit.     Review of Systems   Patient denies headache, fevers, malaise, unintentional weight loss, skin rash, eye pain, sinus congestion and sinus pain, sore throat, dysphagia,  hemoptysis , cough, dyspnea, wheezing, chest pain, palpitations, orthopnea, edema, abdominal pain, nausea, melena, diarrhea, constipation, flank pain, dysuria, hematuria, urinary  Frequency, nocturia, numbness, tingling, seizures,  Focal weakness, Loss of consciousness,  Tremor, insomnia, untreated depression, anxiety, and suicidal ideation.      Objective:  BP 138/68 (BP Location: Left Arm, Patient Position: Sitting, Cuff Size: Normal)   Pulse 85   Temp (!) 97.4 F (36.3 C) (Temporal)   Ht 5' 4.37" (1.635 m)   Wt 148 lb 3.2 oz (67.2 kg)   SpO2 98%   BMI 25.15 kg/m   Physical Exam   General appearance: alert, cooperative and appears stated age Ears: normal TM's and external ear canals both ears Throat: lips, mucosa, and tongue normal; teeth and gums normal Neck:  no adenopathy, no carotid bruit, supple, symmetrical, trachea midline and thyroid not enlarged, symmetric, no tenderness/mass/nodules Back: symmetric, no curvature. ROM normal. No CVA tenderness. Lungs: clear to auscultation bilaterally Heart: regular rate and rhythm, S1, S2 normal, no murmur, click, rub or gallop Abdomen: soft, non-tender; bowel sounds normal; no masses,  no organomegaly Pulses: 2+ and symmetric Skin: Skin color, texture, turgor normal. No rashes or lesions Lymph nodes: Cervical, supraclavicular, and  axillary nodes normal. Neuro: CNs 2-12 intact. DTRs 2+/4 in biceps, brachioradialis, patellars and achilles. Muscle strength 5/5 in upper and lower exremities. Fine resting tremor bilaterally both hands cerebellar function normal. Romberg negative.  No pronator drift.   Gait normal.   Assessment & Plan:   Problem List Items Addressed This Visit      Unprioritized   Hyperlipidemia    LDL and triglycerides have been at goal on statin therapy . She has no side effects and liver enzymes are normal. No changes today   Lab Results  Component Value Date   CHOL 119 05/20/2019   HDL 40.80 05/20/2019   LDLCALC 57 05/20/2019   LDLDIRECT 48.0 04/18/2018   TRIG 106.0 05/20/2019   CHOLHDL 3 05/20/2019         AAA (abdominal aortic aneurysm) without rupture (HCC)    Managed by Dr Lucky Cowboy with serial imaging .  Blood pressure goal is 120/70 or less       Depression with somatization    She continues to maintain  an Improved outlook without medication .  No changes today       Preventative health care    age appropriate education and counseling updated, referrals for preventative services and immunizations addressed, dietary and smoking counseling addressed, most recent labs reviewed.  I have personally reviewed and have noted:  1) the patient's medical and social history 2) The pt's use of alcohol, tobacco, and illicit drugs 3) The patient's current medications and supplements 4) Functional ability including ADL's, fall risk, home safety risk, hearing and visual impairment 5) Diet and physical activities 6) Evidence for depression or mood disorder 7) The patient's height, weight, and BMI have been recorded in the chart  I have made referrals, and provided counseling and education based on review of the above      Controlled type 2 diabetes with neuropathy ([place])    Her diabetes remains  well-controlled on diet alone. Patient is up-to-date on eye exams and overdue for labs.  foot exam was  done today and was normal,  but she is reporting a stabbing sensation under the first MT head of both feet,  And toes feeling cold constantly.  Shoes feel heavy on her feet   Symptoms are not sleep disruptive .  patient has no history of microalbuminuria. Patient is tolerating statin therapy for CAD risk reduction and asa  for secondary prevention    Lab Results  Component Value Date   HGBA1C 6.7 (H) 05/20/2019   Lab Results  Component Value Date   MICROALBUR 1.6 05/20/2019   Lab Results  Component Value Date   CREATININE 1.03 05/20/2019         CKD (chronic kidney disease) stage 3, GFR 30-59 ml/min    Referral to [facility] requested for follow up on CKD secondary to diabetes and hypertension      Relevant Orders   Ambulatory referral to Nephrology   Radiculopathy of cervical region   Radicular pain in left arm - Primary    Unclear if her left  shoulder pain with radiculopathy is peripheral or central in origin.  Chest x ray to rule out Pancoast tumor given history of tobacco abuse,  And Referral to neurology for EMG/[place] studies recommended.        Relevant Orders   DG Chest 2 View   Ambulatory referral to Neurology      I am having Sabrina Montoya. Tax start on Zoster Vaccine Adjuvanted. I am also having her maintain her cholecalciferol, aspirin, isosorbide mononitrate, Multiple Vitamins-Minerals (PRESERVISION AREDS 2 PO), rosuvastatin, omeprazole, Cyanocobalamin (VITAMIN B12 PO), Multiple Vitamins-Minerals (CENTRUM SILVER 50+WOMEN PO), conjugated estrogens, and Turmeric (QC TUMERIC COMPLEX PO).  Meds ordered this encounter  Medications  . Zoster Vaccine Adjuvanted [facility]) injection    Sig: Inject 0.5 mLs into the muscle once for 1 dose.    Dispense:  1 each    Refill:  1   A total of 40 minutes was spent with patient more than half of which was spent in counseling patient on the above mentioned issues , reviewing and explaining recent labs and imaging studies done, and  coordination of care. There are no discontinued medications.  Follow-up: Return in about 3 months (around 12/13/2019) for follow up diabetes.   Crecencio Mc, MD

## 2019-09-14 ENCOUNTER — Encounter: Payer: Self-pay | Admitting: Internal Medicine

## 2019-09-14 ENCOUNTER — Telehealth: Payer: Self-pay | Admitting: Internal Medicine

## 2019-09-14 DIAGNOSIS — M5412 Radiculopathy, cervical region: Secondary | ICD-10-CM | POA: Insufficient documentation

## 2019-09-14 DIAGNOSIS — M792 Neuralgia and neuritis, unspecified: Secondary | ICD-10-CM | POA: Insufficient documentation

## 2019-09-14 NOTE — Assessment & Plan Note (Signed)
Managed by Dr Lucky Cowboy with serial imaging .  Blood pressure goal is 120/70 or less

## 2019-09-14 NOTE — Assessment & Plan Note (Signed)
Her diabetes remains  well-controlled on diet alone. Patient is up-to-date on eye exams and overdue for labs.  foot exam was done today and was normal,  but she is reporting a stabbing sensation under the first MT head of both feet,  And toes feeling cold constantly.  Shoes feel heavy on her feet   Symptoms are not sleep disruptive .  patient has no history of microalbuminuria. Patient is tolerating statin therapy for CAD risk reduction and asa  for secondary prevention    Lab Results  Component Value Date   HGBA1C 6.7 (H) 05/20/2019   Lab Results  Component Value Date   MICROALBUR 1.6 05/20/2019   Lab Results  Component Value Date   CREATININE 1.03 05/20/2019

## 2019-09-14 NOTE — Assessment & Plan Note (Signed)

## 2019-09-14 NOTE — Assessment & Plan Note (Signed)
Unclear if her left shoulder pain with radiculopathy is peripheral or central in origin.  Chest x ray to rule out Pancoast tumor given history of tobacco abuse,  And Referral to neurology for EMG/[place] studies recommended.

## 2019-09-14 NOTE — Assessment & Plan Note (Signed)
She continues to maintain  an Improved outlook without medication .  No changes today

## 2019-09-14 NOTE — Assessment & Plan Note (Signed)
Referral to [facility] requested for follow up on CKD secondary to diabetes and hypertension

## 2019-09-14 NOTE — Telephone Encounter (Signed)
I would like her to go get a chest x ray this week at [facility]  .  To make sure that the pain radiating down her left arm Is not coming from something in her chest

## 2019-09-14 NOTE — Assessment & Plan Note (Deleted)
Unclear if her left shoulder pain with radiculopathy is peripheral or central in origin.  Referral to neurology for EMG/[place] studies recommended.

## 2019-09-14 NOTE — Assessment & Plan Note (Signed)
LDL and triglycerides have been at goal on statin therapy . She has no side effects and liver enzymes are normal. No changes today   Lab Results  Component Value Date   CHOL 119 05/20/2019   HDL 40.80 05/20/2019   LDLCALC 57 05/20/2019   LDLDIRECT 48.0 04/18/2018   TRIG 106.0 05/20/2019   CHOLHDL 3 05/20/2019

## 2019-09-15 NOTE — Telephone Encounter (Signed)
There was no reply attached

## 2019-09-15 NOTE — Telephone Encounter (Signed)
She does not need an appointment for a chest x ray at [facility] she can walk in .

## 2019-09-16 ENCOUNTER — Encounter (INDEPENDENT_AMBULATORY_CARE_PROVIDER_SITE_OTHER): Payer: Self-pay | Admitting: Vascular Surgery

## 2019-09-16 ENCOUNTER — Ambulatory Visit
Admission: RE | Admit: 2019-09-16 | Discharge: 2019-09-16 | Disposition: A | Discharge status: Home / Self Care | Payer: Medicare Other | Source: Ambulatory Visit | Attending: Internal Medicine | Admitting: Internal Medicine

## 2019-09-16 ENCOUNTER — Ambulatory Visit (INDEPENDENT_AMBULATORY_CARE_PROVIDER_SITE_OTHER): Payer: Medicare Other | Admitting: Vascular Surgery

## 2019-09-16 ENCOUNTER — Other Ambulatory Visit: Payer: Self-pay

## 2019-09-16 ENCOUNTER — Ambulatory Visit (INDEPENDENT_AMBULATORY_CARE_PROVIDER_SITE_OTHER): Payer: Medicare Other

## 2019-09-16 VITALS — BP 174/77 | HR 70 | Resp 18 | Ht 64.0 in | Wt 147.0 lb

## 2019-09-16 DIAGNOSIS — M792 Neuralgia and neuritis, unspecified: Secondary | ICD-10-CM | POA: Insufficient documentation

## 2019-09-16 DIAGNOSIS — I700 Atherosclerosis of aorta: Secondary | ICD-10-CM | POA: Diagnosis not present

## 2019-09-16 DIAGNOSIS — I739 Peripheral vascular disease, unspecified: Secondary | ICD-10-CM

## 2019-09-16 DIAGNOSIS — I714 Abdominal aortic aneurysm, without rupture, unspecified: Secondary | ICD-10-CM

## 2019-09-16 DIAGNOSIS — E7800 Pure hypercholesterolemia, unspecified: Secondary | ICD-10-CM

## 2019-09-16 NOTE — Telephone Encounter (Signed)
I called & let patient know that order was placed. She could walk in at her convenience.

## 2019-09-16 NOTE — Progress Notes (Signed)
MRN : [id number]  Sabrina Montoya is a 78 y.o. (1941/01/20) female who presents with chief complaint of  Chief Complaint  Patient presents with  . AAA    1 year follow up  .  History of Present Illness: Patient returns today in follow up of her abdominal aortic aneurysm.  She continues to be bothered by pain and numbness in her feet and lower legs.  She had arterial studies done in years past that were okay.  No new ulceration or infection.  She denies any aneurysm related symptoms. Specifically, the patient denies new back or abdominal pain, or signs of peripheral embolization Her AAA duplex today reveals a stable abdominal aortic aneurysm measuring 3.6 cm in maximal diameter.  Although this is slightly more than her previous study, it is similar to her studies from 2017 in 2018.  Current Outpatient Medications  Medication Sig Dispense Refill  . aspirin 81 MG tablet Take 81 mg by mouth daily.    . cholecalciferol (VITAMIN D) 1000 UNITS tablet Take 1,000 Units by mouth daily.    Marland Kitchen conjugated estrogens (PREMARIN) vaginal cream Place vaginally.    . Cyanocobalamin (VITAMIN B12 PO) Take 1 Dose by mouth daily. GUMMY CHEWS; 3,000 mg    . isosorbide mononitrate (IMDUR) 30 MG 24 hr tablet Take 30 mg by mouth daily.    . Multiple Vitamins-Minerals (CENTRUM SILVER 50+WOMEN PO) Take 1 Dose by mouth daily.    . Multiple Vitamins-Minerals (PRESERVISION AREDS 2 PO) Take by mouth 2 (two) times daily.    Marland Kitchen omeprazole (PRILOSEC) 20 MG capsule TAKE ONE CAPSULE BY MOUTH EVERY DAY 90 capsule 1  . rosuvastatin (CRESTOR) 40 MG tablet Take 1 tablet (40 mg total) by mouth daily. 90 tablet 0  . Turmeric (QC TUMERIC COMPLEX PO) Take 1 mg by mouth daily.     No current facility-administered medications for this visit.     Past Medical History:  Diagnosis Date  . Abdominal aortic aneurysm without mention of rupture    infrarenal, stable, folllowed by Leotis Pain  . Acute posthemorrhagic anemia   . Arthritis   .  B12 deficiency   . CAD (coronary artery disease), autologous vein bypass graft   . Cardiac dysrhythmia, unspecified   . Colon polyp   . Depression   . Diverticulitis of colon   . Dizziness    DUE TO MEDICINES  . Dysrhythmia   . GERD (gastroesophageal reflux disease)   . HOH (hard of hearing)   . Hyperlipidemia   . Hypertension    pt denies. placed on meds after CABG  . IBS (irritable bowel syndrome)   . Neuropathy   . Neuropathy 07/01/2013  . Peripheral vascular disease ([place])    s/p CEA   . Reflux esophagitis   . Rheumatic fever    possible at age 24  . Sliding hiatal hernia   . Tobacco abuse   . Tobacco abuse     Past Surgical History:  Procedure Laterality Date  . ABDOMINAL SURGERY  1976   for pain secondary to scar tissue, s/p apply  . APPENDECTOMY  1974  . BACK SURGERY    . CAROTID ENDARTERECTOMY    . CATARACT EXTRACTION W/PHACO Right 10/18/2016   Procedure: CATARACT EXTRACTION PHACO AND INTRAOCULAR LENS PLACEMENT (IOC);  Surgeon: Estill Cotta, MD;  Location: [facility] ORS;  Service: Ophthalmology;  Laterality: Right;  Lot # [id number] H [place]: 01:04.0 AP%: 24.4 CDE: 30.44  . CATARACT EXTRACTION W/PHACO Left 03/14/2017  Procedure: CATARACT EXTRACTION PHACO AND INTRAOCULAR LENS PLACEMENT ([place])  Left;  Surgeon: Leandrew Koyanagi, MD;  Location: [facility];  Service: Ophthalmology;  Laterality: Left;  . CEA     Carotid stenosis found during workup for dysphagia,  [facility]  . 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 CHOLECYSTECTOMY  2002   Dr. Pat Patrick  . CLOSED MANIPULATION SHOULDER     left shoulder post physical therapy, Right shoulder redo (toggle bolts)  . COLONOSCOPY  09/01/14  . coronary angiography  01/2011   one occluded artery with collateralization, other arteries patent  . CORONARY ARTERY BYPASS GRAFT  2006   s/p triple bypass surgery, [facility]  . heart catherization  2016  . HERNIA REPAIR  may  2011   Dr. Pat Patrick  . hernia repair    . LUMBAR DISC SURGERY  1971   L5, unssuccessful, fusion in 1983 successful (2 lumbar)  . PARTIAL HYSTERECTOMY    . ROTATOR CUFF REPAIR  2002   right shoulder Dr. Nadean Corwin, [place] in [place] History        Tobacco Use  . Smoking status: Former Smoker    Packs/day: 0.50    Years: 30.00    Pack years: 15.00    Types: Cigarettes    Last attempt to quit: 04/01/2012    Years since quitting: 6.4  . Smokeless tobacco: Never Used  . Tobacco comment: quit for 2 years after sinus infection and 6 months after heart surgery  Substance Use Topics  . Alcohol use: No  . Drug use: No      Family History  Problem Relation Age of Onset  . Cancer Sister        cervical cancer  . Diabetes Sister   . Heart disease Brother        coronary artery disease  . Other Brother        suicide  . Diabetes Mother   . Heart disease Mother   . Diabetes Sister   . Other Father        suicide  . Cancer Sister        breast  . Other Other        colon resection due to inflammation -nephew  . Bladder Cancer Neg Hx   . Kidney cancer Neg Hx           Allergies  Allergen Reactions  . Niacin And Related Hives  . Sertraline Hcl Other (See Comments)    Hallucinations,    . Sulfa Drugs Cross Reactors Nausea Only  . Tetracyclines & Related Hives  . Latex Rash    RAST testing was NEGATIVE  for LATEX  . Simvastatin Rash    *Antihyperlipidemics*; elevated LFT's.      REVIEW OF SYSTEMS (Negative unless checked)  Constitutional: [] ?Weight loss  [] ?Fever  [] ?Chills Cardiac: [] ?Chest pain   [] ?Chest pressure   [] ?Palpitations   [] ?Shortness of breath when laying flat   [] ?Shortness of breath at rest   [x] ?Shortness of breath with exertion. Vascular:  [] ?Pain in legs with walking   [] ?Pain in legs at rest   [] ?Pain in legs when laying flat   [] ?Claudication   [] ?Pain in feet  when walking  [] ?Pain in feet at rest  [] ?Pain in feet when laying flat   [] ?History of DVT   [] ?Phlebitis   [] ?Swelling in legs   [] ?  Varicose veins   [] ?Non-healing ulcers Pulmonary:   [] ?Uses home oxygen   [] ?Productive cough   [] ?Hemoptysis   [] ?Wheeze  [x] ?COPD   [] ?Asthma Neurologic:  [] ?Dizziness  [] ?Blackouts   [] ?Seizures   [] ?History of stroke   [] ?History of TIA  [] ?Aphasia   [] ?Temporary blindness   [] ?Dysphagia   [] ?Weakness or numbness in arms   [x] ?Weakness or numbness in legs Musculoskeletal:  [x] ?Arthritis   [] ?Joint swelling   [] ?Joint pain   [] ?Low back pain Hematologic:  [] ?Easy bruising  [] ?Easy bleeding   [] ?Hypercoagulable state   [] ?Anemic   Gastrointestinal:  [] ?Blood in stool   [] ?Vomiting blood  [] ?Gastroesophageal reflux/heartburn   [] ?Abdominal pain Genitourinary:  [] ?Chronic kidney disease   [] ?Difficult urination  [] ?Frequent urination  [] ?Burning with urination   [] ?Hematuria Skin:  [] ?Rashes   [] ?Ulcers   [] ?Wounds Psychological:  [] ?History of anxiety   [] ? History of major depression.   Physical Examination  BP (!) 174/77 (BP Location: Right Arm)   Pulse 70   Resp 18   Ht 5\' 4"  (1.626 m)   Wt 147 lb (66.7 kg)   BMI 25.23 kg/m  Gen:  WD/WN, NAD.  Appears younger than stated age Head: [place]/AT, No temporalis wasting. Ear/Nose/Throat: Hearing grossly intact, nares w/o erythema or drainage Eyes: Conjunctiva clear. Sclera non-icteric Neck: Supple.  Trachea midline Pulmonary:  Good air movement, no use of accessory muscles.  Cardiac: RRR, no JVD Vascular:  Vessel Right Left  Radial Palpable Palpable                          PT Palpable Palpable  DP Palpable Palpable   Gastrointestinal: soft, non-tender/non-distended. No guarding/reflex.  Musculoskeletal: M/S 5/5 throughout.  No deformity or atrophy.  No edema. Neurologic: Sensation grossly intact in extremities.  Symmetrical.  Speech is fluent.  Psychiatric: Judgment intact, Mood & affect appropriate  for pt's clinical situation. Dermatologic: No rashes or ulcers noted.  No cellulitis or open wounds.       Labs No results found for this or any previous visit (from the past 2160 hour(s)).  Radiology No results found.  Assessment/Plan  Hyperlipidemia lipid control important in reducing the progression of atherosclerotic disease. Continue statin therapy  Peripheral vascular disease Can check ABIs at her next visit.  Flow was okay last year  AAA (abdominal aortic aneurysm) without rupture (HCC) Her AAA duplex today reveals a stable abdominal aortic aneurysm measuring 3.6 cm in maximal diameter.  Although this is slightly more than her previous study, it is similar to her studies from 2017 in 2018. Continue annual follow-up.  Blood pressure control and tobacco cessation of benefit to reduce risk of progression.    Leotis Pain, MD  09/16/2019 12:14 PM    This note was created with Dragon medical transcription system.  Any errors from dictation are purely unintentional

## 2019-09-16 NOTE — Assessment & Plan Note (Signed)
Her AAA duplex today reveals a stable abdominal aortic aneurysm measuring 3.6 cm in maximal diameter.  Although this is slightly more than her previous study, it is similar to her studies from 2017 in 2018. Continue annual follow-up.  Blood pressure control and tobacco cessation of benefit to reduce risk of progression.

## 2019-09-16 NOTE — Assessment & Plan Note (Signed)
Can check ABIs at her next visit.  Flow was okay last year

## 2019-09-29 ENCOUNTER — Other Ambulatory Visit: Payer: Self-pay

## 2019-09-29 MED ORDER — OMEPRAZOLE 20 MG PO CPDR: 20.0000 mg | DELAYED_RELEASE_CAPSULE | Freq: Every day | ORAL | 1 refills | Status: DC

## 2019-10-08 DIAGNOSIS — R109 Unspecified abdominal pain: Secondary | ICD-10-CM | POA: Diagnosis not present

## 2019-10-08 DIAGNOSIS — N1831 Chronic kidney disease, stage 3a: Secondary | ICD-10-CM | POA: Diagnosis not present

## 2019-10-08 DIAGNOSIS — N281 Cyst of kidney, acquired: Secondary | ICD-10-CM | POA: Diagnosis not present

## 2019-11-11 ENCOUNTER — Telehealth: Payer: Self-pay | Admitting: Internal Medicine

## 2019-11-11 DIAGNOSIS — M1810 Unilateral primary osteoarthritis of first carpometacarpal joint, unspecified hand: Secondary | ICD-10-CM

## 2019-11-11 NOTE — Telephone Encounter (Signed)
Pt is going to cancel appt for neuropathy but would like a referral for moderate pain in thumb from arthritis. Please advise/

## 2019-11-11 NOTE — Telephone Encounter (Signed)
Left message letting pt know that we have placed the referral.

## 2019-11-11 NOTE — Telephone Encounter (Signed)
Referral to hand surgeon in  progress

## 2019-11-28 ENCOUNTER — Telehealth: Payer: Self-pay | Admitting: Internal Medicine

## 2019-11-28 NOTE — Telephone Encounter (Signed)
Form handed to Dr Derrel Nip

## 2019-11-28 NOTE — Telephone Encounter (Signed)
Pt dropped off handicapp renewal form. Form is up front in color folder.

## 2019-12-17 ENCOUNTER — Ambulatory Visit: Payer: Medicare Other | Admitting: Internal Medicine

## 2019-12-19 ENCOUNTER — Telehealth: Payer: Self-pay | Admitting: Internal Medicine

## 2019-12-19 DIAGNOSIS — E1140 Type 2 diabetes mellitus with diabetic neuropathy, unspecified: Secondary | ICD-10-CM

## 2019-12-19 DIAGNOSIS — E7800 Pure hypercholesterolemia, unspecified: Secondary | ICD-10-CM

## 2019-12-19 DIAGNOSIS — R55 Syncope and collapse: Secondary | ICD-10-CM

## 2019-12-19 NOTE — Telephone Encounter (Signed)
Patient would like to have labs before her appt . No lab orders. Let me know when they are in so I can call patient. thanks

## 2019-12-19 NOTE — Telephone Encounter (Signed)
Would also like to have kidney function checked.

## 2019-12-22 NOTE — Telephone Encounter (Signed)
I have ordered lipid panel, cmp, and A1c. Is there anything else that needs to be ordered?

## 2019-12-29 NOTE — Telephone Encounter (Signed)
No answer on home phone called patient to make sure she had went to the ER.

## 2019-12-29 NOTE — Telephone Encounter (Signed)
Patient said on Sunday a weekago felt like something cut her breath off and she like Blacked out, and then she came back just lasted a few seconds, and Saturday it happened again same thing . Today this happen  again.Per patient and she just does not feel right . Advised she needs to be evaluated Immediately that Blacking out is not normal and can be a sign of TIA or more serious stroke. To Go the ER patient stated she will have her daughter drive her to the ER but not [place] she is going to [facility] ER.

## 2019-12-29 NOTE — Telephone Encounter (Signed)
Noted. Agree with need for ED evaluation.

## 2019-12-29 NOTE — Telephone Encounter (Signed)
  Pt wanted a call back about the her labs and also wanted to speck to Janett Billow about a episode she had on Saturday. Where she felt like thing went black and stopped and it only lasted a couple seconds and her daughter was there and couldn't tell anything was going on with her

## 2019-12-30 DIAGNOSIS — I714 Abdominal aortic aneurysm, without rupture: Secondary | ICD-10-CM | POA: Diagnosis not present

## 2019-12-30 DIAGNOSIS — I10 Essential (primary) hypertension: Secondary | ICD-10-CM | POA: Diagnosis not present

## 2019-12-30 DIAGNOSIS — I6523 Occlusion and stenosis of bilateral carotid arteries: Secondary | ICD-10-CM | POA: Diagnosis not present

## 2019-12-30 DIAGNOSIS — R55 Syncope and collapse: Secondary | ICD-10-CM | POA: Diagnosis not present

## 2019-12-30 DIAGNOSIS — R5383 Other fatigue: Secondary | ICD-10-CM | POA: Diagnosis not present

## 2019-12-30 DIAGNOSIS — I25119 Atherosclerotic heart disease of native coronary artery with unspecified angina pectoris: Secondary | ICD-10-CM | POA: Diagnosis not present

## 2019-12-31 ENCOUNTER — Telehealth (INDEPENDENT_AMBULATORY_CARE_PROVIDER_SITE_OTHER): Payer: Medicare Other | Admitting: Internal Medicine

## 2019-12-31 ENCOUNTER — Encounter: Payer: Self-pay | Admitting: Internal Medicine

## 2019-12-31 DIAGNOSIS — I714 Abdominal aortic aneurysm, without rupture, unspecified: Secondary | ICD-10-CM

## 2019-12-31 DIAGNOSIS — N1831 Chronic kidney disease, stage 3a: Secondary | ICD-10-CM

## 2019-12-31 DIAGNOSIS — R55 Syncope and collapse: Secondary | ICD-10-CM | POA: Insufficient documentation

## 2019-12-31 DIAGNOSIS — I499 Cardiac arrhythmia, unspecified: Secondary | ICD-10-CM | POA: Diagnosis not present

## 2019-12-31 DIAGNOSIS — E1140 Type 2 diabetes mellitus with diabetic neuropathy, unspecified: Secondary | ICD-10-CM

## 2019-12-31 NOTE — Progress Notes (Signed)
Virtual Visit via [email or web address]  This visit type was conducted due to national recommendations for restrictions regarding the COVID-19 pandemic (e.g. social distancing).  This format is felt to be most appropriate for this patient at this time.  All issues noted in this document were discussed and addressed.  No physical exam was performed (except for noted visual exam findings with Video Visits).   I connected with@ on 12/31/19 at  3:30 PM EST by a video enabled telemedicine application  and verified that I am speaking with the correct person using two identifiers. Location patient: home Location provider: work or home office Persons participating in the virtual visit: patient, provider  I discussed the limitations, risks, security and privacy concerns of performing an evaluation and management service by telephone and the availability of in person appointments. I also discussed with the patient that there may be a patient responsible charge related to this service. The patient expressed understanding and agreed to proceed.   Reason for visit: follow up on type 2 DM ,  Recent syncopal episode x 2   HPI:   Near Syncopal episode x 2 last week. Cardiac monitor placed . Episodes occurred while standing up at kitchen sink,  Everything started to go black and she was unable to take a breath for a few seconds.  No unusual activity or stress preceding the events. Did not lose consciousness on either occasion  New onset insomnia:  Patient  has been having some trouble sleeping.  Wakes up 2 to 3 times per night . Bedtime hygiene reviewed,  Patient has been using an electronic book to read before bed.  Does not drink caffeinated beverages after 3 PM.  Only voids  bladder once per night.  No snoring partner. Does not drink alcohol to excess.  Not exercising excessively in the evening. Patient does have a history of anxiety but does not lie awake worrying about issues that cannot be resolved. Does not take  stimulants. .  Hypertension: patient checks blood pressure twice weekly at home.  Readings have been < 120/80at rest  . Patient is following a reduce salt diet most days and is taking medications as prescribed  . Lab Results  Component Value Date   HGBA1C 6.7 (H) 05/20/2019      ROS: See pertinent positives and negatives per HPI.  Past Medical History:  Diagnosis Date  . Abdominal aortic aneurysm without mention of rupture    infrarenal, stable, folllowed by Leotis Pain  . Acute posthemorrhagic anemia   . Arthritis   . B12 deficiency   . CAD (coronary artery disease), autologous vein bypass graft   . Cardiac dysrhythmia, unspecified   . Colon polyp   . Depression   . Diverticulitis of colon   . Dizziness    DUE TO MEDICINES  . Dysrhythmia   . GERD (gastroesophageal reflux disease)   . HOH (hard of hearing)   . Hyperlipidemia   . Hypertension    pt denies. placed on meds after CABG  . IBS (irritable bowel syndrome)   . Neuropathy   . Neuropathy 07/01/2013  . Peripheral vascular disease ([place])    s/p CEA   . Reflux esophagitis   . Rheumatic fever    possible at age 24  . Sliding hiatal hernia   . Tobacco abuse   . Tobacco abuse     Past Surgical History:  Procedure Laterality Date  . ABDOMINAL SURGERY  1976   for pain secondary to scar tissue, s/p apply  .  APPENDECTOMY  1974  . BACK SURGERY    . CAROTID ENDARTERECTOMY    . CATARACT EXTRACTION W/PHACO Right 10/18/2016   Procedure: CATARACT EXTRACTION PHACO AND INTRAOCULAR LENS PLACEMENT (IOC);  Surgeon: Estill Cotta, MD;  Location: [facility] ORS;  Service: Ophthalmology;  Laterality: Right;  Lot # [id number] H [place]: 01:04.0 AP%: 24.4 CDE: 30.44  . CATARACT EXTRACTION W/PHACO Left 03/14/2017   Procedure: CATARACT EXTRACTION PHACO AND INTRAOCULAR LENS PLACEMENT ([facility])  Left;  Surgeon: Leandrew Koyanagi, MD;  Location: [facility];  Service: Ophthalmology;  Laterality: Left;  . CEA     Carotid stenosis found  during workup for dysphagia,  [facility]  . 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 CHOLECYSTECTOMY  2002   Dr. Pat Patrick  . CLOSED MANIPULATION SHOULDER     left shoulder post physical therapy, Right shoulder redo (toggle bolts)  . COLONOSCOPY  09/01/14  . coronary angiography  01/2011   one occluded artery with collateralization, other arteries patent  . CORONARY ARTERY BYPASS GRAFT  2006   s/p triple bypass surgery, [facility]  . heart catherization  2016  . HERNIA REPAIR  may 2011   Dr. Pat Patrick  . hernia repair    . LUMBAR DISC SURGERY  1971   L5, unssuccessful, fusion in 1983 successful (2 lumbar)  . PARTIAL HYSTERECTOMY    . ROTATOR CUFF REPAIR  2002   right shoulder Dr. Nadean Corwin, [facility] in [place]  . VASCULAR SURGERY      Family History  Problem Relation Age of Onset  . Cancer Sister        cervical cancer  . Diabetes Sister   . Heart disease Brother        coronary artery disease  . Other Brother        suicide  . Diabetes Mother   . Heart disease Mother   . Diabetes Sister   . Other Father        suicide  . Cancer Sister        breast  . Breast cancer Sister   . Other Other        colon resection due to inflammation -nephew  . Bladder Cancer Neg Hx   . Kidney cancer Neg Hx     SOCIAL HX:  reports that she quit smoking about 7 years ago. Her smoking use included cigarettes. She has a 15.00 pack-year smoking history. She has never used smokeless tobacco. She reports that she does not drink alcohol or use drugs.   Current Outpatient Medications:  .  aspirin 81 MG tablet, Take 81 mg by mouth daily., Disp: , Rfl:  .  cholecalciferol (VITAMIN D) 1000 UNITS tablet, Take 1,000 Units by mouth daily., Disp: , Rfl:  .  conjugated estrogens (PREMARIN) vaginal cream, Place vaginally., Disp: , Rfl:  .  Cyanocobalamin (VITAMIN B12 PO), Take 1 Dose by mouth daily. GUMMY CHEWS; 3,000 mg, Disp: , Rfl:  .  isosorbide  mononitrate (IMDUR) 30 MG 24 hr tablet, Take 30 mg by mouth daily., Disp: , Rfl:  .  Multiple Vitamins-Minerals (CENTRUM SILVER 50+WOMEN PO), Take 1 Dose by mouth daily., Disp: , Rfl:  .  Multiple Vitamins-Minerals (PRESERVISION AREDS 2 PO), Take by mouth 2 (two) times daily., Disp: , Rfl:  .  omeprazole (PRILOSEC) 20 MG capsule, Take 1 capsule (20 mg total) by mouth daily., Disp: 90 capsule, Rfl: 1 .  rosuvastatin (CRESTOR) 40 MG tablet, Take  1 tablet (40 mg total) by mouth daily., Disp: 90 tablet, Rfl: 0 .  Turmeric (QC TUMERIC COMPLEX PO), Take 1 mg by mouth daily., Disp: , Rfl:   EXAM:  VITALS per patient if applicable:  GENERAL: alert, oriented, appears well and in no acute distress  HEENT: atraumatic, conjunttiva clear, no obvious abnormalities on inspection of external nose and ears  NECK: normal movements of the head and neck  LUNGS: on inspection no signs of respiratory distress, breathing rate appears normal, no obvious gross SOB, gasping or wheezing  CV: no obvious cyanosis  MS: moves all visible extremities without noticeable abnormality  PSYCH/NEURO: pleasant and cooperative, no obvious depression or anxiety, speech and thought processing grossly intact  ASSESSMENT AND PLAN:  Discussed the following assessment and plan:  Arrhythmia, sinus node  Stage 3a chronic kidney disease  AAA (abdominal aortic aneurysm) without rupture (HCC)  Controlled type 2 diabetes with neuropathy (HCC)  Arrhythmia, sinus node Her recent episodes of near syncope were likely sinus pauses.  She is wearing a 30 day  cardionet monitor ordered by her cardiologist   CKD (chronic kidney disease) stage 3, GFR 30-59 ml/min She has been referred to  [facility] requested for follow up on CKD secondary to diabetes and hypertension  Lab Results  Component Value Date   CREATININE 1.03 05/20/2019   Lab Results  Component Value Date   NA 143 05/20/2019   K 4.7 05/20/2019   CL 106  05/20/2019   CO2 29 05/20/2019     AAA (abdominal aortic aneurysm) without rupture ([place]) Managed by Dr Lucky Cowboy with serial imaging .  Blood pressure is at goal of 120/70 or less   Controlled type 2 diabetes with neuropathy ([place]) Her diabetes remains  well-controlled on diet alone. Patient is up-to-date on eye exams and overdue for labs.    Lab Results  Component Value Date   HGBA1C 6.7 (H) 05/20/2019    Lab Results  Component Value Date   HGBA1C 6.7 (H) 05/20/2019   Lab Results  Component Value Date   MICROALBUR 1.6 05/20/2019   Lab Results  Component Value Date   CREATININE 1.03 05/20/2019       I discussed the assessment and treatment plan with the patient. The patient was provided an opportunity to ask questions and all were answered. The patient agreed with the plan and demonstrated an understanding of the instructions.   The patient was advised to call back or seek an in-person evaluation if the symptoms worsen or if the condition fails to improve as anticipated.  I provided  30 minutes of non-face-to-face time during this encounter reviewing patient's current problems and past surgeries, labs and imaging studies, providing counseling on the above mentioned problems , and coordination  of care . Crecencio Mc, MD

## 2019-12-31 NOTE — Telephone Encounter (Signed)
Spoke with patient she did not go to ED she was able to get in with Cardiology instead in on monitor. Wanted to give PCP FYI due to patient a 3 month follow up today there are labs in [place] from Cardiology.

## 2019-12-31 NOTE — Assessment & Plan Note (Signed)
Seen by her cardiologist on Feb 23.  Labs unrevealing for cause .  Cardiac monitor placed

## 2019-12-31 NOTE — Telephone Encounter (Signed)
Pt called returning your call about her going to ED

## 2020-01-02 NOTE — Assessment & Plan Note (Signed)
Managed by Dr Lucky Cowboy with serial imaging .  Blood pressure is at goal of 120/70 or less

## 2020-01-02 NOTE — Assessment & Plan Note (Signed)
Her diabetes remains  well-controlled on diet alone. Patient is up-to-date on eye exams and overdue for labs.    Lab Results  Component Value Date   HGBA1C 6.7 (H) 05/20/2019    Lab Results  Component Value Date   HGBA1C 6.7 (H) 05/20/2019   Lab Results  Component Value Date   MICROALBUR 1.6 05/20/2019   Lab Results  Component Value Date   CREATININE 1.03 05/20/2019

## 2020-01-02 NOTE — Assessment & Plan Note (Signed)
She has been referred to  [facility] requested for follow up on CKD secondary to diabetes and hypertension  Lab Results  Component Value Date   CREATININE 1.03 05/20/2019   Lab Results  Component Value Date   NA 143 05/20/2019   K 4.7 05/20/2019   CL 106 05/20/2019   CO2 29 05/20/2019

## 2020-01-02 NOTE — Assessment & Plan Note (Signed)
Her recent episodes of near syncope were likely sinus pauses.  She is wearing a 30 day  cardionet monitor ordered by her cardiologist

## 2020-02-13 DIAGNOSIS — I25119 Atherosclerotic heart disease of native coronary artery with unspecified angina pectoris: Secondary | ICD-10-CM | POA: Diagnosis not present

## 2020-02-13 DIAGNOSIS — R55 Syncope and collapse: Secondary | ICD-10-CM | POA: Diagnosis not present

## 2020-02-13 DIAGNOSIS — I10 Essential (primary) hypertension: Secondary | ICD-10-CM | POA: Diagnosis not present

## 2020-02-23 DIAGNOSIS — Z85828 Personal history of other malignant neoplasm of skin: Secondary | ICD-10-CM | POA: Diagnosis not present

## 2020-02-23 DIAGNOSIS — L309 Dermatitis, unspecified: Secondary | ICD-10-CM | POA: Diagnosis not present

## 2020-02-23 DIAGNOSIS — L570 Actinic keratosis: Secondary | ICD-10-CM | POA: Diagnosis not present

## 2020-02-23 DIAGNOSIS — L820 Inflamed seborrheic keratosis: Secondary | ICD-10-CM | POA: Diagnosis not present

## 2020-02-25 DIAGNOSIS — I6523 Occlusion and stenosis of bilateral carotid arteries: Secondary | ICD-10-CM | POA: Diagnosis not present

## 2020-02-29 DIAGNOSIS — R55 Syncope and collapse: Secondary | ICD-10-CM | POA: Diagnosis not present

## 2020-03-12 ENCOUNTER — Other Ambulatory Visit: Payer: Self-pay

## 2020-03-12 MED ORDER — OMEPRAZOLE 20 MG PO CPDR: 20.0000 mg | DELAYED_RELEASE_CAPSULE | Freq: Every day | ORAL | 1 refills | Status: DC

## 2020-03-26 ENCOUNTER — Telehealth: Payer: Self-pay

## 2020-03-26 NOTE — Telephone Encounter (Signed)
Please call and confirm pt doing better.  Question if was covid tested.  Any f/u needed.

## 2020-03-26 NOTE — Telephone Encounter (Signed)
Left message for patient to return call to office. 

## 2020-03-26 NOTE — Telephone Encounter (Signed)
Called patient. She picked up afrin and another medication. She stated she wants to hold off on appointment due to sx getting better with the medication.   [place] [facility] [place] RECORD AccessNurse Patient Name: Sabrina Montoya Gender: Female DOB: 07-19-1941 Age: 79 Y 9 M 28 D Return Phone Number: [phone] (Primary), [phone] (Secondary) Address: City/State/Zip: [place] [address] Client [facility] [place] [facility] [place] Client Site [place] - Day Physician Deborra Medina - MD Contact Type Call Who Is Calling Patient / Member / Family / Caregiver Call Type Triage / Clinical Relationship To Patient Self Return Phone Number [phone] (Primary) Chief Complaint Ear Fullness or Congestion Reason for Call Symptomatic / Request for [place] states it feels like there is water in her ear. Translation No Nurse Assessment Nurse: Marcelline Deist, RN, Mickel Baas Date/Time Eilene Ghazi Time): 03/25/2020 5:55:03 PM Confirm and document reason for call. If symptomatic, describe symptoms. ---Caller states it feels like there is water in her ear. Can hear heartbeat in it. States it feels like it is her whole side of her head. Has the patient had close contact with a person known or suspected to have the novel coronavirus illness OR traveled / lives in area with major community spread (including international travel) in the last 14 days from the onset of symptoms? * If Asymptomatic, screen for exposure and travel within the last 14 days. ---No Does the patient have any new or worsening symptoms? ---Yes Will a triage be completed? ---Yes Related visit to physician within the last 2 weeks? ---No Does the PT have any chronic conditions? (i.e. diabetes, asthma, this includes High risk factors for pregnancy, etc.) ---No Is this a behavioral health or substance abuse call?  ---No Guidelines Guideline Title Affirmed Question Affirmed Notes Nurse Date/Time Eilene Ghazi Time) Ear - Congestion Ear congestion Roe Rutherford 03/25/2020 5:56:37 PM Disp. Time Eilene Ghazi Time) Disposition Final User 03/25/2020 5:59:56 PM [place], RN, Elige Radon Disagree/Comply ComplyPLEASE NOTE: All timestamps contained within this report are represented as [place] Standard Time. CONFIDENTIALTY NOTICE: This fax transmission is intended only for the addressee. It contains information that is legally privileged, confidential or otherwise protected from use or disclosure. If you are not the intended recipient, you are strictly prohibited from reviewing, disclosing, copying using or disseminating any of this information or taking any action in reliance on or regarding this information. If you have received this fax in error, please notify us immediately by telephone so that we can arrange for its return to [place]. Phone: [phone], Toll-Free: [phone], Fax: [phone] Page: 2 of 2 Call Id: [id number] [place] Understands Yes PreDisposition Go to Urgent Care/Walk-In Clinic Care Advice Given Per Guideline HOME CARE: AGGRAVATING FACTORS: * Oxymetazoline Nasal Drops (e.g., Afrin): Available OTC. Clean out the nose before using. Spray each nostril once, wait one minute for absorption, and then spray a second time. * CETIRIZINE is a newer (second generation) antihistamine. The dosage of cetirizine (e.g., OTC Zyrtec, Reactine) is 10 mg once a day. * LORATADINE is a newer (second generation) antihistamine. The dosage of loratadine (e.g., OTC Claritin, Alavert) is 10 mg once a day. Alavert is not available in [place]. EXPECTED COURSE: * Ear congestion symptoms usually get better within 2 days (48 hours) with treatment. CALL BACK IF: * Ear congestion lasts over 48 hours * Ear pain or fever occurs * You become worse. CARE ADVICE given per Ear - Congestion (Adult)  guideline.

## 2020-04-26 ENCOUNTER — Ambulatory Visit (INDEPENDENT_AMBULATORY_CARE_PROVIDER_SITE_OTHER): Payer: Medicare Other

## 2020-04-26 VITALS — Ht 64.0 in | Wt 151.0 lb

## 2020-04-26 DIAGNOSIS — Z0000 Encounter for general adult medical examination without abnormal findings: Secondary | ICD-10-CM | POA: Diagnosis not present

## 2020-04-26 NOTE — Patient Instructions (Addendum)
Sabrina Montoya , Thank you for taking time to come for your Medicare Wellness Visit. I appreciate your ongoing commitment to your health goals. Please review the following plan we discussed and let me know if I can assist you in the future.   These are the goals we discussed: Goals      Patient Stated   .  Follow up with pcp as needed (pt-stated)       This is a list of the screening recommended for you and due dates:  Health Maintenance  Topic Date Due  . Hemoglobin A1C  11/20/2019  . Urine Protein Check  05/19/2020  . Tetanus Vaccine  04/26/2021*  . Eye exam for diabetics  04/28/2020  . Flu Shot  06/06/2020  . Mammogram  07/29/2020  . Complete foot exam   09/11/2020  . DEXA scan (bone density measurement)  Completed  . COVID-19 Vaccine  Completed  .  Hepatitis C: One time screening is recommended by Center for Disease Control  (CDC) for  adults born from [birth year] through 1964.   Completed  . Pneumonia vaccines  Completed  *Topic was postponed. The date shown is not the original due date.    Immunizations Immunization History  Administered Date(s) Administered  . Fluad Quad(high Dose 65+) 08/01/2019  . Influenza Split 10/10/2011, 08/26/2012  . Influenza, High Dose Seasonal PF 08/16/2016, 11/09/2017, 09/06/2018  . Influenza,inj,Quad PF,6+ Mos 07/25/2013, 07/28/2014, 08/13/2015  . Influenza-Unspecified 08/06/2012  . PFIZER SARS-COV-2 Vaccination 11/12/2019, 12/03/2019  . Pneumococcal Conjugate-13 08/26/2014  . Pneumococcal Polysaccharide-23 09/02/2010   Keep all routine maintenance appointments.   Follow up 05/04/20 @ 1:30  Advanced directives: declined  Conditions/risks identified: none   Follow up in one year for your annual wellness visit    Preventive Care 78 Years and Older, Female Preventive care refers to lifestyle choices and visits with your health care provider that can promote health and wellness. What does preventive care include?  A yearly physical exam. This  is also called an annual well check.  Dental exams once or twice a year.  Routine eye exams. Ask your health care provider how often you should have your eyes checked.  Personal lifestyle choices, including:  Daily care of your teeth and gums.  Regular physical activity.  Eating a healthy diet.  Avoiding tobacco and drug use.  Limiting alcohol use.  Practicing safe sex.  Taking low-dose aspirin every day.  Taking vitamin and mineral supplements as recommended by your health care provider. What happens during an annual well check? The services and screenings done by your health care provider during your annual well check will depend on your age, overall health, lifestyle risk factors, and family history of disease. Counseling  Your health care provider may ask you questions about your:  Alcohol use.  Tobacco use.  Drug use.  Emotional well-being.  Home and relationship well-being.  Sexual activity.  Eating habits.  History of falls.  Memory and ability to understand (cognition).  Work and work Statistician.  Reproductive health. Screening  You may have the following tests or measurements:  Height, weight, and BMI.  Blood pressure.  Lipid and cholesterol levels. These may be checked every 5 years, or more frequently if you are over 32 years old.  Skin check.  Lung cancer screening. You may have this screening every year starting at age 46 if you have a 30-pack-year history of smoking and currently smoke or have quit within the past 15 years.  Fecal occult blood  test (FOBT) of the stool. You may have this test every year starting at age 31.  Flexible sigmoidoscopy or colonoscopy. You may have a sigmoidoscopy every 5 years or a colonoscopy every 10 years starting at age 55.  Hepatitis C blood test.  Hepatitis B blood test.  Sexually transmitted disease (STD) testing.  Diabetes screening. This is done by checking your blood sugar (glucose) after you  have not eaten for a while (fasting). You may have this done every 1-3 years.  Bone density scan. This is done to screen for osteoporosis. You may have this done starting at age 68.  Mammogram. This may be done every 1-2 years. Talk to your health care provider about how often you should have regular mammograms. Talk with your health care provider about your test results, treatment options, and if necessary, the need for more tests. Vaccines  Your health care provider may recommend certain vaccines, such as:  Influenza vaccine. This is recommended every year.  Tetanus, diphtheria, and acellular pertussis (Tdap, Td) vaccine. You may need a Td booster every 10 years.  Zoster vaccine. You may need this after age 66.  Pneumococcal 13-valent conjugate (PCV13) vaccine. One dose is recommended after age 20.  Pneumococcal polysaccharide (PPSV23) vaccine. One dose is recommended after age 43. Talk to your health care provider about which screenings and vaccines you need and how often you need them. This information is not intended to replace advice given to you by your health care provider. Make sure you discuss any questions you have with your health care provider. Document Released: 11/19/2015 Document Revised: 07/12/2016 Document Reviewed: 08/24/2015 Elsevier Interactive Patient Education  2017 [place] Prevention in the Home Falls can cause injuries. They can happen to people of all ages. There are many things you can do to make your home safe and to help prevent falls. What can I do on the outside of my home?  Regularly fix the edges of walkways and driveways and fix any cracks.  Remove anything that might make you trip as you walk through a door, such as a raised step or threshold.  Trim any bushes or trees on the path to your home.  Use bright outdoor lighting.  Clear any walking paths of anything that might make someone trip, such as rocks or tools.  Regularly check to  see if handrails are loose or broken. Make sure that both sides of any steps have handrails.  Any raised decks and porches should have guardrails on the edges.  Have any leaves, snow, or ice cleared regularly.  Use sand or salt on walking paths during winter.  Clean up any spills in your garage right away. This includes oil or grease spills. What can I do in the bathroom?  Use night lights.  Install grab bars by the toilet and in the tub and shower. Do not use towel bars as grab bars.  Use non-skid mats or decals in the tub or shower.  If you need to sit down in the shower, use a plastic, non-slip stool.  Keep the floor dry. Clean up any water that spills on the floor as soon as it happens.  Remove soap buildup in the tub or shower regularly.  Attach bath mats securely with double-sided non-slip rug tape.  Do not have throw rugs and other things on the floor that can make you trip. What can I do in the bedroom?  Use night lights.  Make sure that you have a  light by your bed that is easy to reach.  Do not use any sheets or blankets that are too big for your bed. They should not hang down onto the floor.  Have a firm chair that has side arms. You can use this for support while you get dressed.  Do not have throw rugs and other things on the floor that can make you trip. What can I do in the kitchen?  Clean up any spills right away.  Avoid walking on wet floors.  Keep items that you use a lot in easy-to-reach places.  If you need to reach something above you, use a strong step stool that has a grab bar.  Keep electrical cords out of the way.  Do not use floor polish or wax that makes floors slippery. If you must use wax, use non-skid floor wax.  Do not have throw rugs and other things on the floor that can make you trip. What can I do with my stairs?  Do not leave any items on the stairs.  Make sure that there are handrails on both sides of the stairs and use  them. Fix handrails that are broken or loose. Make sure that handrails are as long as the stairways.  Check any carpeting to make sure that it is firmly attached to the stairs. Fix any carpet that is loose or worn.  Avoid having throw rugs at the top or bottom of the stairs. If you do have throw rugs, attach them to the floor with carpet tape.  Make sure that you have a light switch at the top of the stairs and the bottom of the stairs. If you do not have them, ask someone to add them for you. What else can I do to help prevent falls?  Wear shoes that:  Do not have high heels.  Have rubber bottoms.  Are comfortable and fit you well.  Are closed at the toe. Do not wear sandals.  If you use a stepladder:  Make sure that it is fully opened. Do not climb a closed stepladder.  Make sure that both sides of the stepladder are locked into place.  Ask someone to hold it for you, if possible.  Clearly mark and make sure that you can see:  Any grab bars or handrails.  First and last steps.  Where the edge of each step is.  Use tools that help you move around (mobility aids) if they are needed. These include:  Canes.  Walkers.  Scooters.  Crutches.  Turn on the lights when you go into a dark area. Replace any light bulbs as soon as they burn out.  Set up your furniture so you have a clear path. Avoid moving your furniture around.  If any of your floors are uneven, fix them.  If there are any pets around you, be aware of where they are.  Review your medicines with your doctor. Some medicines can make you feel dizzy. This can increase your chance of falling. Ask your doctor what other things that you can do to help prevent falls. This information is not intended to replace advice given to you by your health care provider. Make sure you discuss any questions you have with your health care provider. Document Released: 08/19/2009 Document Revised: 03/30/2016 Document Reviewed:  11/27/2014 Elsevier Interactive Patient Education  2017 [employer].

## 2020-04-26 NOTE — Progress Notes (Addendum)
Subjective:   Sabrina Montoya is a 79 y.o. female who presents for Medicare Annual (Subsequent) preventive examination.  Review of Systems    No ROS.  Medicare Wellness Virtual Visit.   Cardiac Risk Factors include: advanced age (>22men, >36 women);diabetes mellitus     Objective:    Today's Vitals   04/26/20 1108  Weight: 151 lb (68.5 kg)  Height: 5\' 4"  (1.626 m)   Body mass index is 25.92 kg/m.  Advanced Directives 04/26/2020 04/24/2019 08/05/2018 02/12/2018 08/15/2017 03/14/2017 03/09/2017  Does Patient Have a Medical Advance Directive? No No No No No No No  Type of Advance Directive - - - - - - -  Copy of Healthcare Power of Attorney in Chart? - - - - - - -  Would patient like information on creating a medical advance directive? No - Patient declined No - Patient declined - No - Patient declined - Yes (MAU/Ambulatory/Procedural Areas - Information given) -    Current Medications (verified) Outpatient Encounter Medications as of 04/26/2020  Medication Sig  . aspirin 81 MG tablet Take 81 mg by mouth daily.  . cholecalciferol (VITAMIN D) 1000 UNITS tablet Take 1,000 Units by mouth daily.  Marland Kitchen conjugated estrogens (PREMARIN) vaginal cream Place vaginally.  . Cyanocobalamin (VITAMIN B12 PO) Take 1 Dose by mouth daily. GUMMY CHEWS; 3,000 mg  . isosorbide mononitrate (IMDUR) 30 MG 24 hr tablet Take 30 mg by mouth daily.  . Multiple Vitamins-Minerals (CENTRUM SILVER 50+WOMEN PO) Take 1 Dose by mouth daily.  . Multiple Vitamins-Minerals (PRESERVISION AREDS 2 PO) Take by mouth 2 (two) times daily.  Marland Kitchen omeprazole (PRILOSEC) 20 MG capsule Take 1 capsule (20 mg total) by mouth daily.  . rosuvastatin (CRESTOR) 40 MG tablet Take 1 tablet (40 mg total) by mouth daily.  . Turmeric (QC TUMERIC COMPLEX PO) Take 1 mg by mouth daily.   No facility-administered encounter medications on file as of 04/26/2020.    Allergies (verified) Niacin and related, Sertraline hcl, Sulfa drugs cross reactors,  Tetracyclines & related, Latex, and Simvastatin   History: Past Medical History:  Diagnosis Date  . Abdominal aortic aneurysm without mention of rupture    infrarenal, stable, folllowed by Leotis Pain  . Acute posthemorrhagic anemia   . Arthritis   . B12 deficiency   . CAD (coronary artery disease), autologous vein bypass graft   . Cardiac dysrhythmia, unspecified   . Colon polyp   . Depression   . Diverticulitis of colon   . Dizziness    DUE TO MEDICINES  . Dysrhythmia   . GERD (gastroesophageal reflux disease)   . HOH (hard of hearing)   . Hyperlipidemia   . Hypertension    pt denies. placed on meds after CABG  . IBS (irritable bowel syndrome)   . Neuropathy   . Neuropathy 07/01/2013  . Peripheral vascular disease ([place])    s/p CEA   . Reflux esophagitis   . Rheumatic fever    possible at age 70  . Sliding hiatal hernia   . Tobacco abuse   . Tobacco abuse    Past Surgical History:  Procedure Laterality Date  . ABDOMINAL SURGERY  1976   for pain secondary to scar tissue, s/p apply  . APPENDECTOMY  1974  . BACK SURGERY    . CAROTID ENDARTERECTOMY    . CATARACT EXTRACTION W/PHACO Right 10/18/2016   Procedure: CATARACT EXTRACTION PHACO AND INTRAOCULAR LENS PLACEMENT (IOC);  Surgeon: Estill Cotta, MD;  Location: [facility] ORS;  Service: Ophthalmology;  Laterality: Right;  Lot # [id number] H [place]: 01:04.0 AP%: 24.4 CDE: 30.44  . CATARACT EXTRACTION W/PHACO Left 03/14/2017   Procedure: CATARACT EXTRACTION PHACO AND INTRAOCULAR LENS PLACEMENT ([place])  Left;  Surgeon: Leandrew Koyanagi, MD;  Location: [facility];  Service: Ophthalmology;  Laterality: Left;  . CEA     Carotid stenosis found during workup for dysphagia,  [facility]  . 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 CHOLECYSTECTOMY  2002   Dr. Pat Patrick  . CLOSED MANIPULATION SHOULDER     left shoulder post physical therapy, Right shoulder redo (toggle bolts)  .  COLONOSCOPY  09/01/14  . coronary angiography  01/2011   one occluded artery with collateralization, other arteries patent  . CORONARY ARTERY BYPASS GRAFT  2006   s/p triple bypass surgery, [facility]  . heart catherization  2016  . HERNIA REPAIR  may 2011   Dr. Pat Patrick  . hernia repair    . LUMBAR DISC SURGERY  1971   L5, unssuccessful, fusion in 1983 successful (2 lumbar)  . PARTIAL HYSTERECTOMY    . ROTATOR CUFF REPAIR  2002   right shoulder Dr. Nadean Corwin, [facility] in [place]  . VASCULAR SURGERY     Family History  Problem Relation Age of Onset  . Cancer Sister        cervical cancer  . Diabetes Sister   . Heart disease Brother        coronary artery disease  . Other Brother        suicide  . Diabetes Mother   . Heart disease Mother   . Diabetes Sister   . Other Father        suicide  . Cancer Sister        breast  . Breast cancer Sister   . Other Other        colon resection due to inflammation -nephew  . Bladder Cancer Neg Hx   . Kidney cancer Neg Hx    Social History   Socioeconomic History  . Marital status: Widowed    Spouse name: Not on file  . Number of children: Not on file  . Years of education: Not on file  . Highest education level: Not on file  Occupational History  . Not on file  Tobacco Use  . Smoking status: Former Smoker    Packs/day: 0.50    Years: 30.00    Pack years: 15.00    Types: Cigarettes    Quit date: 04/01/2012    Years since quitting: 8.0  . Smokeless tobacco: Never Used  . Tobacco comment: quit for 2 years after sinus infection and 6 months after heart surgery  Vaping Use  . Vaping Use: Never used  Substance and Sexual Activity  . Alcohol use: No  . Drug use: No  . Sexual activity: Not Currently  Other Topics Concern  . Not on file  Social History Narrative   Widowed   Social Determinants of Health   Financial Resource Strain:   . Difficulty of Paying Living Expenses:   Food Insecurity:   . Worried About  Charity fundraiser in the Last Year:   . Arboriculturist in the Last Year:   Transportation Needs:   . Film/video editor (Medical):   Marland Kitchen Lack of Transportation (Non-Medical):   Physical Activity:   . Days of Exercise per Week:   . Minutes of  Exercise per Session:   Stress:   . Feeling of Stress :   Social Connections:   . Frequency of Communication with Friends and Family:   . Frequency of Social Gatherings with Friends and Family:   . Attends Religious Services:   . Active Member of Clubs or Organizations:   . Attends Archivist Meetings:   Marland Kitchen Marital Status:     Tobacco Counseling Counseling given: Not Answered Comment: quit for 2 years after sinus infection and 6 months after heart surgery   Clinical Intake:  Pre-visit preparation completed: Yes        Diabetes: Yes  How often do you need to have someone help you when you read instructions, pamphlets, or other written materials from your doctor or pharmacy?: 1 - Never  Diabetic? Yes   Interpreter Needed?: No      Activities of Daily Living In your present state of health, do you have any difficulty performing the following activities: 04/26/2020  Hearing? N  Vision? N  Difficulty concentrating or making decisions? N  Walking or climbing stairs? N  Dressing or bathing? N  Doing errands, shopping? N  Preparing Food and eating ? N  Using the Toilet? N  In the past six months, have you accidently leaked urine? N  Do you have problems with loss of bowel control? N  Managing your Medications? N  Managing your Finances? N  Housekeeping or managing your Housekeeping? N  Some recent data might be hidden    Patient Care Team: Crecencio Mc, MD as PCP - General (Internal Medicine) Crecencio Mc, MD (Internal Medicine) Bary Castilla Forest Gleason, MD (General Surgery)  Indicate any recent Medical Services you may have received from other than [facility] providers in the past year (date may be  approximate).     Assessment:   This is a routine wellness examination for Sabrina Montoya.  I connected with Serene today by telephone and verified that I am speaking with the correct person using two identifiers. Location patient: home Location provider: work Persons participating in the virtual visit: patient, Marine scientist.    I discussed the limitations, risks, security and privacy concerns of performing an evaluation and management service by telephone and the availability of in person appointments. The patient expressed understanding and verbally consented to this telephonic visit.    Interactive audio and video telecommunications were attempted between this provider and patient, however failed, due to patient having technical difficulties OR patient did not have access to video capability.  We continued and completed visit with audio only.  Some vital signs may be absent or patient reported.   Hearing/Vision screen  Hearing Screening   125Hz  250Hz  500Hz  1000Hz  2000Hz  3000Hz  4000Hz  6000Hz  8000Hz   Right ear:           Left ear:           Comments: Patient is able to hear conversational tones without difficulty.  No issues reported.  Vision Screening Comments: Followed by [facility] Wears corrective lenses Cataract extraction, bilateral No retinopathy reported.  Visual acuity not assessed, virtual visit.  They have seen their ophthalmologist in the last 12 months.     Dietary issues and exercise activities discussed: Current Exercise Habits: The patient does not participate in regular exercise at present  Goals      Patient Stated   .  Follow up with pcp as needed (pt-stated)      Depression Screen [facility] 2/9 Scores 04/26/2020 09/12/2019 05/29/2019 04/24/2019 02/12/2018  08/02/2017 02/08/2017  PHQ - 2 Score 0 0 0 0 0 0 0    Fall Risk Fall Risk  04/26/2020 12/31/2019 09/12/2019 05/29/2019 04/24/2019  Falls in the past year? 0 0 0 0 0  Number falls in past yr: 0 - - 0 -  Comment - - - - -   Injury with Fall? 0 - - - -  Comment - - - - -  Risk for fall due to : - - - - -  Follow up Falls evaluation completed Falls evaluation completed Falls evaluation completed - -    Any stairs in or around the home? Yes  If so, are there any without handrails? No  Home free of loose throw rugs in walkways, pet beds, electrical cords, etc? Yes  Adequate lighting in your home to reduce risk of falls? Yes   ASSISTIVE DEVICES UTILIZED TO PREVENT FALLS:  Life alert? No  Use of a cane, walker or w/c? No  Grab bars in the bathroom? Yes  Shower chair or bench in shower? Yes  Elevated toilet seat or a handicapped toilet? No   TIMED UP AND GO:  Was the test performed? No . Virtual visit.  Cognitive Function: MMSE - Mini Mental State Exam 02/02/2016  Orientation to time 5  Orientation to Place 5  Registration 3  Attention/ Calculation 5  Recall 3  Language- name 2 objects 2  Language- repeat 1  Language- follow 3 step command 3  Language- read & follow direction 1  Write a sentence 1  Copy design 1  Total score 30     6CIT Screen 04/26/2020 04/24/2019 02/12/2018 02/08/2017  What Year? 0 points 0 points 0 points 0 points  What month? - 0 points 0 points 0 points  What time? - 0 points 0 points 0 points  Count back from 20 - 0 points 0 points 0 points  Months in reverse 0 points 0 points 0 points 0 points  Repeat phrase 0 points - 0 points 0 points  Total Score - - 0 0    Immunizations Immunization History  Administered Date(s) Administered  . Fluad Quad(high Dose 65+) 08/01/2019  . Influenza Split 10/10/2011, 08/26/2012  . Influenza, High Dose Seasonal PF 08/16/2016, 11/09/2017, 09/06/2018  . Influenza,inj,Quad PF,6+ Mos 07/25/2013, 07/28/2014, 08/13/2015  . Influenza-Unspecified 08/06/2012  . PFIZER SARS-COV-2 Vaccination 11/12/2019, 12/03/2019  . Pneumococcal Conjugate-13 08/26/2014  . Pneumococcal Polysaccharide-23 09/02/2010    TDAP status: Due, Education has been  provided regarding the importance of this vaccine. Advised may receive this vaccine at local pharmacy or Health Dept. Aware to provide a copy of the vaccination record if obtained from local pharmacy or Health Dept. Verbalized acceptance and understanding.Deferred per patient preference.   Zostavax completed No   Shingrix Completed?: No.    Education has been provided regarding the importance of this vaccine. Patient has been advised to call insurance company to determine out of pocket expense if they have not yet received this vaccine. Advised may also receive vaccine at local pharmacy or Health Dept. Verbalized acceptance and understanding.  Health Maintenance Health Maintenance  Topic Date Due  . HEMOGLOBIN A1C  11/20/2019  . URINE MICROALBUMIN  05/19/2020  . TETANUS/TDAP  04/26/2021 (Originally 05/27/1960)  . OPHTHALMOLOGY EXAM  04/28/2020  . INFLUENZA VACCINE  06/06/2020  . MAMMOGRAM  07/29/2020  . FOOT EXAM  09/11/2020  . DEXA SCAN  Completed  . COVID-19 Vaccine  Completed  . Hepatitis C Screening  Completed  .  PNA vac Low Risk Adult  Completed   Cologuard- Completed 08/2016. Due every 3 years. Deferred per patient preference for follow up with pcp. Next scheduled OV 05/04/20.  Vision Screening: Recommended annual ophthalmology exams for early detection of glaucoma and other disorders of the eye. Is the patient up to date with their annual eye exam? Yes  Dental Screening: Recommended annual dental exams for proper oral hygiene.  Community Resource Referral / Chronic Care Management: CRR required this visit?  No  CCM required this visit?  No     Plan:   Keep all routine maintenance appointments.   Follow up 05/04/20 @ 1:30  I have personally reviewed and noted the following in the patient's chart:   . Medical and social history . Use of alcohol, tobacco or illicit drugs  . Current medications and supplements . Functional ability and status . Nutritional  status . Physical activity . Advanced directives . List of other physicians . Hospitalizations, surgeries, and ER visits in previous 12 months . Vitals . Screenings to include cognitive, depression, and falls . Referrals and appointments  In addition, I have reviewed and discussed with patient certain preventive protocols, quality metrics, and best practice recommendations. A written personalized care plan for preventive services as well as general preventive health recommendations were provided to patient via mychart.      OBrien-Blaney, Tsion Inghram L, LPN   5/46/2703    I have reviewed the above information and agree with above.   Deborra Medina, MD

## 2020-05-04 ENCOUNTER — Ambulatory Visit: Payer: 59 | Admitting: Internal Medicine

## 2020-05-06 ENCOUNTER — Other Ambulatory Visit: Payer: 59

## 2020-05-13 ENCOUNTER — Encounter: Payer: Self-pay | Admitting: Internal Medicine

## 2020-05-13 ENCOUNTER — Other Ambulatory Visit: Payer: Self-pay

## 2020-05-13 ENCOUNTER — Ambulatory Visit (INDEPENDENT_AMBULATORY_CARE_PROVIDER_SITE_OTHER): Payer: Medicare Other

## 2020-05-13 ENCOUNTER — Ambulatory Visit (INDEPENDENT_AMBULATORY_CARE_PROVIDER_SITE_OTHER): Payer: Medicare Other | Admitting: Internal Medicine

## 2020-05-13 VITALS — BP 126/68 | HR 74 | Temp 98.3°F | Resp 13 | Ht 64.0 in | Wt 151.6 lb

## 2020-05-13 DIAGNOSIS — M25552 Pain in left hip: Secondary | ICD-10-CM | POA: Diagnosis not present

## 2020-05-13 DIAGNOSIS — E1140 Type 2 diabetes mellitus with diabetic neuropathy, unspecified: Secondary | ICD-10-CM

## 2020-05-13 DIAGNOSIS — M79644 Pain in right finger(s): Secondary | ICD-10-CM | POA: Diagnosis not present

## 2020-05-13 DIAGNOSIS — I708 Atherosclerosis of other arteries: Secondary | ICD-10-CM | POA: Diagnosis not present

## 2020-05-13 DIAGNOSIS — M5432 Sciatica, left side: Secondary | ICD-10-CM | POA: Diagnosis not present

## 2020-05-13 DIAGNOSIS — M1612 Unilateral primary osteoarthritis, left hip: Secondary | ICD-10-CM | POA: Diagnosis not present

## 2020-05-13 DIAGNOSIS — E7800 Pure hypercholesterolemia, unspecified: Secondary | ICD-10-CM

## 2020-05-13 DIAGNOSIS — I70202 Unspecified atherosclerosis of native arteries of extremities, left leg: Secondary | ICD-10-CM | POA: Diagnosis not present

## 2020-05-13 DIAGNOSIS — G8929 Other chronic pain: Secondary | ICD-10-CM | POA: Diagnosis not present

## 2020-05-13 LAB — COMPREHENSIVE METABOLIC PANEL
ALT: 14 U/L (ref 0–35)
AST: 28 U/L (ref 0–37)
Albumin: 4.8 g/dL (ref 3.5–5.2)
Alkaline Phosphatase: 61 U/L (ref 39–117)
BUN: 29 mg/dL — ABNORMAL HIGH (ref 6–23)
CO2: 27 mEq/L (ref 19–32)
Calcium: 9.8 mg/dL (ref 8.4–10.5)
Chloride: 106 mEq/L (ref 96–112)
Creatinine, Ser: 1.17 mg/dL (ref 0.40–1.20)
GFR: 44.74 mL/min — ABNORMAL LOW (ref >60.00)
Glucose, Bld: 117 mg/dL — ABNORMAL HIGH (ref 70–99)
Potassium: 4.5 mEq/L (ref 3.5–5.1)
Sodium: 140 mEq/L (ref 135–145)
Total Bilirubin: 0.5 mg/dL (ref 0.2–1.2)
Total Protein: 7.1 g/dL (ref 6.0–8.3)

## 2020-05-13 LAB — LIPID PANEL
Cholesterol: 119 mg/dL (ref 0–200)
HDL: 38.2 mg/dL — ABNORMAL LOW (ref >39.00)
LDL Cholesterol: 44 mg/dL (ref 0–99)
NonHDL: 81.2
Total CHOL/HDL Ratio: 3
Triglycerides: 186 mg/dL — ABNORMAL HIGH (ref 0.0–149.0)
VLDL: 37.2 mg/dL (ref 0.0–40.0)

## 2020-05-13 LAB — SEDIMENTATION RATE: Sed Rate: 17 mm/hr (ref 0–30)

## 2020-05-13 LAB — HEMOGLOBIN A1C: Hgb A1c MFr Bld: 6.6 % — ABNORMAL HIGH (ref 4.6–6.5)

## 2020-05-13 MED ORDER — PREDNISONE 10 MG PO TABS: ORAL_TABLET | ORAL | 0 refills | Status: DC

## 2020-05-13 MED ORDER — TIZANIDINE HCL 2 MG PO TABS: 4.0000 mg | ORAL_TABLET | Freq: Three times a day (TID) | ORAL | Status: DC | PRN

## 2020-05-13 NOTE — Progress Notes (Signed)
Subjective:  Patient ID: Sabrina Montoya, female    DOB: 20-Aug-1941  Age: 79 y.o. MRN: [id number]  CC: The primary encounter diagnosis was Left hip pain. Diagnoses of Controlled type 2 diabetes with neuropathy ([place]), Pure hypercholesterolemia, Sciatica, left side, and Chronic thumb pain, right were also pertinent to this visit.  HPI [name] presents for EVALUATION OF BACK PAIN   This visit occurred during the SARS-CoV-2 public health emergency.  Safety protocols were in place, including screening questions prior to the visit, additional usage of staff PPE, and extensive cleaning of exam room while observing appropriate contact time as indicated for disinfecting solutions.   She has been having midline back pain per usual since surgery, years ago,  But she started having pain in the early morning that radiates to her left knee.  The pain is at times so severe she feels unsure if she can bear weight , was too severe  Last Sunday to leave house.  Used  heat and tylenol.  Still a problem in the morning,  No longer radiating to the knee   Pain with weightbearing   Right thumb PAIN:  S/P steroid  injection in April by [facility] .  Transient relief 2 weeks only.   Rheumatology referral  Discussed for alternative therapy  Outpatient Medications Prior to Visit  Medication Sig Dispense Refill  . aspirin 81 MG tablet Take 81 mg by mouth daily.    . cholecalciferol (VITAMIN D) 1000 UNITS tablet Take 1,000 Units by mouth daily.    Marland Kitchen conjugated estrogens (PREMARIN) vaginal cream Place vaginally.    . Cyanocobalamin (VITAMIN B12 PO) Take 1 Dose by mouth daily. GUMMY CHEWS; 3,000 mg    . isosorbide mononitrate (IMDUR) 30 MG 24 hr tablet Take 30 mg by mouth daily.    . Multiple Vitamins-Minerals (CENTRUM SILVER 50+WOMEN PO) Take 1 Dose by mouth daily.    . Multiple Vitamins-Minerals (PRESERVISION AREDS 2 PO) Take by mouth 2 (two) times daily.    Marland Kitchen omeprazole (PRILOSEC) 20 MG capsule Take 1 capsule  (20 mg total) by mouth daily. 90 capsule 1  . rosuvastatin (CRESTOR) 40 MG tablet Take 1 tablet (40 mg total) by mouth daily. 90 tablet 0  . Turmeric (QC TUMERIC COMPLEX PO) Take 1 mg by mouth daily.     No facility-administered medications prior to visit.    Review of Systems;  Patient denies headache, fevers, malaise, unintentional weight loss, skin rash, eye pain, sinus congestion and sinus pain, sore throat, dysphagia,  hemoptysis , cough, dyspnea, wheezing, chest pain, palpitations, orthopnea, edema, abdominal pain, nausea, melena, diarrhea, constipation, flank pain, dysuria, hematuria, urinary  Frequency, nocturia, numbness, tingling, seizures,  Focal weakness, Loss of consciousness,  Tremor, insomnia, depression, anxiety, and suicidal ideation.      Objective:  BP 126/68 (BP Location: Left Arm, Patient Position: Sitting, Cuff Size: Normal)   Pulse 74   Temp 98.3 F (36.8 C) (Temporal)   Resp 13   Ht 5\' 4"  (1.626 m)   Wt 151 lb 9.6 oz (68.8 kg)   SpO2 98%   BMI 26.02 kg/m   BP Readings from Last 3 Encounters:  05/13/20 126/68  12/31/19 127/63  09/16/19 (!) 174/77    Wt Readings from Last 3 Encounters:  05/13/20 151 lb 9.6 oz (68.8 kg)  04/26/20 151 lb (68.5 kg)  12/31/19 151 lb (68.5 kg)    General appearance: alert, cooperative and appears stated age Ears: normal TM's and external  ear canals both ears Throat: lips, mucosa, and tongue normal; teeth and gums normal Neck: no adenopathy, no carotid bruit, supple, symmetrical, trachea midline and thyroid not enlarged, symmetric, no tenderness/mass/nodules Back: symmetric, no curvature. ROM normal. No CVA tenderness. Lungs: clear to auscultation bilaterally Heart: regular rate and rhythm, S1, S2 normal, no murmur, click, rub or gallop Abdomen: soft, non-tender; bowel sounds normal; no masses,  no organomegaly Pulses: 2+ and symmetric MSK: left hip ROM impaired by internal /external rotation  Skin: Skin color, texture,  turgor normal. No rashes or lesions Lymph nodes: Cervical, supraclavicular, and axillary nodes normal.  Lab Results  Component Value Date   HGBA1C 6.6 (H) 05/13/2020   HGBA1C 6.7 (H) 05/20/2019   HGBA1C 6.8 (H) 10/23/2018    Lab Results  Component Value Date   CREATININE 1.17 05/13/2020   CREATININE 1.03 05/20/2019   CREATININE 1.33 (H) 10/23/2018    Lab Results  Component Value Date   WBC 4.2 04/18/2018   HGB 10.6 (L) 04/18/2018   HCT 31.0 (L) 04/18/2018   PLT 211.0 04/18/2018   GLUCOSE 117 (H) 05/13/2020   CHOL 119 05/13/2020   TRIG 186.0 (H) 05/13/2020   HDL 38.20 (L) 05/13/2020   LDLDIRECT 48.0 04/18/2018   LDLCALC 44 05/13/2020   ALT 14 05/13/2020   AST 28 05/13/2020   NA 140 05/13/2020   K 4.5 05/13/2020   CL 106 05/13/2020   CREATININE 1.17 05/13/2020   BUN 29 (H) 05/13/2020   CO2 27 05/13/2020   TSH 1.64 06/02/2019   HGBA1C 6.6 (H) 05/13/2020   MICROALBUR 1.6 05/20/2019    DG Chest 2 View  Result Date: 09/16/2019 CLINICAL DATA:  Left arm pain. EXAM: CHEST - 2 VIEW COMPARISON:  March 05, 2014. FINDINGS: The heart size and mediastinal contours are within normal limits. Both lungs are clear. No pneumothorax or pleural effusion is noted. Status post coronary bypass graft. Atherosclerosis of thoracic aorta is noted. The visualized skeletal structures are unremarkable. IMPRESSION: No active cardiopulmonary disease. Aortic Atherosclerosis (ICD10-I70.0). Electronically Signed   By: Marijo Conception M.D.   On: 09/16/2019 16:14   AAA Duplex  Result Date: 09/16/2019 ABDOMINAL AORTA STUDY Indications: Follow up exam for known AAA. Limitations: Air/bowel gas.  Comparison Study: Maximum Aorta diameters:                   [place] on 09/08/16 = 3.81cm                   [place] on 03/09/17 = 3.9cm                   [place] on 09/13/18 = 3.28cm Performing Technologist: Blondell Reveal RT, RDMS, RVT  Examination Guidelines: A complete evaluation includes B-mode imaging, spectral Doppler, color  Doppler, and power Doppler as needed of all accessible portions of each vessel. Bilateral testing is considered an integral part of a complete examination. Limited examinations for reoccurring indications may be performed as noted.  Abdominal Aorta Findings: +-----------+-------+----------+----------+--------+--------+--------+ Location   AP (cm)Trans (cm)PSV (cm/s)WaveformThrombusComments +-----------+-------+----------+----------+--------+--------+--------+ Proximal   2.74   2.49      62        biphasic                 +-----------+-------+----------+----------+--------+--------+--------+ Mid        3.61   3.64      71        biphasic                 +-----------+-------+----------+----------+--------+--------+--------+  Distal     2.57   2.12      55        biphasic                 +-----------+-------+----------+----------+--------+--------+--------+ RT CIA Prox1.1    1.1       93        biphasic                 +-----------+-------+----------+----------+--------+--------+--------+ LT CIA Prox1.0    1.1       88        biphasic                 +-----------+-------+----------+----------+--------+--------+--------+  Summary: There is evidence of abnormal dilatation of the mid/distal Abdominal aorta. No hemodynamically significant stenosis. The maximum diameter of the abdominal aorta appears increased when compared to the previous exam on 09/13/18, however, today's diameters more closely correlate with the prior exams noted above.  *See table(s) above for measurements and observations.  Electronically signed by Leotis Pain MD on 09/16/2019 at 4:54:10 PM.   Final     Assessment & Plan:   Problem List Items Addressed This Visit      Unprioritized   Chronic thumb pain, right (Chronic)    Referral to rheumatologist for management        Relevant Medications   predniSONE (DELTASONE) 10 MG tablet   tiZANidine (ZANAFLEX) 4 MG tablet   Other Relevant Orders    Ambulatory referral to Rheumatology   Hyperlipidemia   Controlled type 2 diabetes with neuropathy ([place])   Sciatica, left side    Left hip imaging today to rule out alternate source of pain .  Mild degenerative changes noted. Prednisone taper , tizanidine, extension exercises .       Relevant Medications   tiZANidine (ZANAFLEX) 4 MG tablet    Other Visit Diagnoses    Left hip pain    -  Primary   Relevant Orders   DG Hip Unilat W OR W/O Pelvis 2-3 Views Left (Completed)   Sedimentation rate (Completed)      I am having Marlinda Miranda. Jue start on predniSONE and tiZANidine. I am also having her maintain her cholecalciferol, aspirin, isosorbide mononitrate, Multiple Vitamins-Minerals (PRESERVISION AREDS 2 PO), rosuvastatin, Cyanocobalamin (VITAMIN B12 PO), Multiple Vitamins-Minerals (CENTRUM SILVER 50+WOMEN PO), conjugated estrogens, Turmeric (QC TUMERIC COMPLEX PO), and omeprazole.  Meds ordered this encounter  Medications  . DISCONTD: tiZANidine (ZANAFLEX) tablet 4 mg  . predniSONE (DELTASONE) 10 MG tablet    Sig: 6 tablets daily for 3 days, then reduce by 1 tablet daily until gone    Dispense:  33 tablet    Refill:  0  . tiZANidine (ZANAFLEX) 4 MG tablet    Sig: Take 1 tablet (4 mg total) by mouth every 6 (six) hours as needed for muscle spasms.    Dispense:  30 tablet    Refill:  0    Medications Discontinued During This Encounter  Medication Reason  . tiZANidine (ZANAFLEX) tablet 4 mg     Follow-up: No follow-ups on file.   Crecencio Mc, MD

## 2020-05-13 NOTE — Patient Instructions (Addendum)
LET'S TRY A Prednisone taper for 8 days:  60 mg daily for 3 days,  Ten start the 10 mg daily tapering dose  Please do the Back extension exercises:  3 sets of 10 daily.  If pain resolves OR IMPROVES,  Keep doing them  If not, let me know wo we can make a PT referral.     referall to rheumatology for thumb joint injections

## 2020-05-15 DIAGNOSIS — G8929 Other chronic pain: Secondary | ICD-10-CM | POA: Insufficient documentation

## 2020-05-15 MED ORDER — TIZANIDINE HCL 4 MG PO TABS: 4.0000 mg | ORAL_TABLET | Freq: Four times a day (QID) | ORAL | 0 refills | Status: DC | PRN

## 2020-05-15 NOTE — Assessment & Plan Note (Signed)
Left hip imaging today to rule out alternate source of pain .  Mild degenerative changes noted. Prednisone taper , tizanidine, extension exercises .

## 2020-05-15 NOTE — Assessment & Plan Note (Signed)
Referral to rheumatologist for management

## 2020-07-13 DIAGNOSIS — Z85828 Personal history of other malignant neoplasm of skin: Secondary | ICD-10-CM | POA: Diagnosis not present

## 2020-07-13 DIAGNOSIS — L821 Other seborrheic keratosis: Secondary | ICD-10-CM | POA: Diagnosis not present

## 2020-07-13 DIAGNOSIS — L570 Actinic keratosis: Secondary | ICD-10-CM | POA: Diagnosis not present

## 2020-07-13 DIAGNOSIS — L820 Inflamed seborrheic keratosis: Secondary | ICD-10-CM | POA: Diagnosis not present

## 2020-07-13 DIAGNOSIS — C44622 Squamous cell carcinoma of skin of right upper limb, including shoulder: Secondary | ICD-10-CM | POA: Diagnosis not present

## 2020-07-13 DIAGNOSIS — L218 Other seborrheic dermatitis: Secondary | ICD-10-CM | POA: Diagnosis not present

## 2020-08-10 DIAGNOSIS — H353132 Nonexudative age-related macular degeneration, bilateral, intermediate dry stage: Secondary | ICD-10-CM | POA: Diagnosis not present

## 2020-09-03 ENCOUNTER — Other Ambulatory Visit: Payer: Self-pay | Admitting: Internal Medicine

## 2020-09-06 DIAGNOSIS — D485 Neoplasm of uncertain behavior of skin: Secondary | ICD-10-CM | POA: Diagnosis not present

## 2020-09-06 DIAGNOSIS — L570 Actinic keratosis: Secondary | ICD-10-CM | POA: Diagnosis not present

## 2020-09-06 DIAGNOSIS — L814 Other melanin hyperpigmentation: Secondary | ICD-10-CM | POA: Diagnosis not present

## 2020-09-06 DIAGNOSIS — L720 Epidermal cyst: Secondary | ICD-10-CM | POA: Diagnosis not present

## 2020-09-06 DIAGNOSIS — D2271 Melanocytic nevi of right lower limb, including hip: Secondary | ICD-10-CM | POA: Diagnosis not present

## 2020-09-06 DIAGNOSIS — D1801 Hemangioma of skin and subcutaneous tissue: Secondary | ICD-10-CM | POA: Diagnosis not present

## 2020-09-06 DIAGNOSIS — L821 Other seborrheic keratosis: Secondary | ICD-10-CM | POA: Diagnosis not present

## 2020-09-06 DIAGNOSIS — Z85828 Personal history of other malignant neoplasm of skin: Secondary | ICD-10-CM | POA: Diagnosis not present

## 2020-09-17 ENCOUNTER — Encounter (INDEPENDENT_AMBULATORY_CARE_PROVIDER_SITE_OTHER): Payer: Self-pay | Admitting: Vascular Surgery

## 2020-09-17 ENCOUNTER — Ambulatory Visit (INDEPENDENT_AMBULATORY_CARE_PROVIDER_SITE_OTHER): Payer: Medicare Other | Admitting: Vascular Surgery

## 2020-09-17 ENCOUNTER — Ambulatory Visit (INDEPENDENT_AMBULATORY_CARE_PROVIDER_SITE_OTHER): Payer: Medicare Other

## 2020-09-17 ENCOUNTER — Other Ambulatory Visit: Payer: Self-pay

## 2020-09-17 VITALS — BP 166/72 | HR 79 | Ht 64.0 in | Wt 151.0 lb

## 2020-09-17 DIAGNOSIS — I714 Abdominal aortic aneurysm, without rupture, unspecified: Secondary | ICD-10-CM

## 2020-09-17 DIAGNOSIS — E1140 Type 2 diabetes mellitus with diabetic neuropathy, unspecified: Secondary | ICD-10-CM

## 2020-09-17 DIAGNOSIS — I739 Peripheral vascular disease, unspecified: Secondary | ICD-10-CM

## 2020-09-17 DIAGNOSIS — E7800 Pure hypercholesterolemia, unspecified: Secondary | ICD-10-CM | POA: Diagnosis not present

## 2020-09-17 DIAGNOSIS — I6523 Occlusion and stenosis of bilateral carotid arteries: Secondary | ICD-10-CM

## 2020-09-17 NOTE — Assessment & Plan Note (Signed)
Her ABIs were in the normal range at 1.06 on the right and 1.12 on the left.  Good waveforms and digital pressures distally.  Perfusion is maintained.  No role for intervention.  Recheck in 1 year

## 2020-09-17 NOTE — Assessment & Plan Note (Signed)
blood glucose control important in reducing the progression of atherosclerotic disease. Also, involved in wound healing. On appropriate medications.  

## 2020-09-17 NOTE — Assessment & Plan Note (Signed)
She had a carotid endarterectomy performed on the left almost 15 years ago.  This was done in [place].  Her surgeon there has recently retired.  He checked this earlier this year, but she does not have a follow-up going forward.  I told her we would be happy to follow this and we will plan to see this with her next visit and add a duplex to her carotids.

## 2020-09-17 NOTE — Progress Notes (Signed)
MRN : [id number]  Sabrina Montoya is a 79 y.o. (June 16, 1941) female who presents with chief complaint of  Chief Complaint  Patient presents with  . Follow-up    1 year U/S follow up  .  History of Present Illness: Patient returns today in follow up of multiple vascular issues.  She is having some neuropathy symptoms in both her feet as well as occasionally her hands.  Otherwise she is doing well without any complaints today.  She does not have any disabling claudication, rest pain, or ulceration.  She has no aneurysm related symptoms. Specifically, the patient denies new back or abdominal pain, or signs of peripheral embolization.  We performed noninvasive studies of the lower extremities and her aorta today.  Her ABIs were in the normal range at 1.06 on the right and 1.12 on the left.  Good waveforms and digital pressures distally. Her ABIs were in the normal range at 1.06 on the right and 1.12 on the left.  Good waveforms and digital pressures distally. She also has carotid disease that was followed in [place]. She had a carotid endarterectomy performed on the left almost 15 years ago.  This was done in [place].  Her surgeon there has recently retired.  He checked this earlier this year, but she does not have a follow-up going forward.   Current Outpatient Medications  Medication Sig Dispense Refill  . aspirin 81 MG tablet Take 81 mg by mouth daily.    . cholecalciferol (VITAMIN D) 1000 UNITS tablet Take 1,000 Units by mouth daily.    Marland Kitchen conjugated estrogens (PREMARIN) vaginal cream Place vaginally.    . Cyanocobalamin (VITAMIN B12 PO) Take 1 Dose by mouth daily. GUMMY CHEWS; 3,000 mg    . isosorbide mononitrate (IMDUR) 30 MG 24 hr tablet Take 30 mg by mouth daily.    . Multiple Vitamins-Minerals (CENTRUM SILVER 50+WOMEN PO) Take 1 Dose by mouth daily.    . Multiple Vitamins-Minerals (PRESERVISION AREDS 2 PO) Take by mouth 2 (two) times daily.    Marland Kitchen omeprazole (PRILOSEC) 20 MG capsule TAKE 1  CAPSULE(20 MG) BY MOUTH DAILY 90 capsule 1  . rosuvastatin (CRESTOR) 40 MG tablet Take 1 tablet (40 mg total) by mouth daily. 90 tablet 0  . triamcinolone cream (KENALOG) 0.1 % Apply topically.    . Turmeric (QC TUMERIC COMPLEX PO) Take 1 mg by mouth daily.    . predniSONE (DELTASONE) 10 MG tablet 6 tablets daily for 3 days, then reduce by 1 tablet daily until gone (Patient not taking: Reported on 09/17/2020) 33 tablet 0  . tiZANidine (ZANAFLEX) 4 MG tablet Take 1 tablet (4 mg total) by mouth every 6 (six) hours as needed for muscle spasms. 30 tablet 0   No current facility-administered medications for this visit.    Past Medical History:  Diagnosis Date  . Abdominal aortic aneurysm without mention of rupture    infrarenal, stable, folllowed by Leotis Pain  . Acute posthemorrhagic anemia   . Arthritis   . B12 deficiency   . CAD (coronary artery disease), autologous vein bypass graft   . Cardiac dysrhythmia, unspecified   . Colon polyp   . Depression   . Diverticulitis of colon   . Dizziness    DUE TO MEDICINES  . Dysrhythmia   . GERD (gastroesophageal reflux disease)   . HOH (hard of hearing)   . Hyperlipidemia   . Hypertension    pt denies. placed on meds after CABG  . IBS (  irritable bowel syndrome)   . Neuropathy   . Neuropathy 07/01/2013  . Peripheral vascular disease ([place])    s/p CEA   . Reflux esophagitis   . Rheumatic fever    possible at age 69  . Sliding hiatal hernia   . Tobacco abuse   . Tobacco abuse     Past Surgical History:  Procedure Laterality Date  . ABDOMINAL SURGERY  1976   for pain secondary to scar tissue, s/p apply  . APPENDECTOMY  1974  . BACK SURGERY    . CAROTID ENDARTERECTOMY    . CATARACT EXTRACTION W/PHACO Right 10/18/2016   Procedure: CATARACT EXTRACTION PHACO AND INTRAOCULAR LENS PLACEMENT (IOC);  Surgeon: Estill Cotta, MD;  Location: [facility] ORS;  Service: Ophthalmology;  Laterality: Right;  Lot # [id number] H [place]: 01:04.0 AP%: 24.4 CDE:  30.44  . CATARACT EXTRACTION W/PHACO Left 03/14/2017   Procedure: CATARACT EXTRACTION PHACO AND INTRAOCULAR LENS PLACEMENT ([place])  Left;  Surgeon: Leandrew Koyanagi, MD;  Location: [facility];  Service: Ophthalmology;  Laterality: Left;  . CEA     Carotid stenosis found during workup for dysphagia,  [facility]  . 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 CHOLECYSTECTOMY  2002   Dr. Pat Patrick  . CLOSED MANIPULATION SHOULDER     left shoulder post physical therapy, Right shoulder redo (toggle bolts)  . COLONOSCOPY  09/01/14  . coronary angiography  01/2011   one occluded artery with collateralization, other arteries patent  . CORONARY ARTERY BYPASS GRAFT  2006   s/p triple bypass surgery, [facility]  . heart catherization  2016  . HERNIA REPAIR  may 2011   Dr. Pat Patrick  . hernia repair    . LUMBAR DISC SURGERY  1971   L5, unssuccessful, fusion in 1983 successful (2 lumbar)  . PARTIAL HYSTERECTOMY    . ROTATOR CUFF REPAIR  2002   right shoulder Dr. Nadean Corwin, [facility] in [place] History   Tobacco Use  . Smoking status: Former Smoker    Packs/day: 0.50    Years: 30.00    Pack years: 15.00    Types: Cigarettes    Quit date: 04/01/2012    Years since quitting: 8.4  . Smokeless tobacco: Never Used  . Tobacco comment: quit for 2 years after sinus infection and 6 months after heart surgery  Vaping Use  . Vaping Use: Never used  Substance Use Topics  . Alcohol use: No  . Drug use: No      Family History  Problem Relation Age of Onset  . Cancer Sister        cervical cancer  . Diabetes Sister   . Heart disease Brother        coronary artery disease  . Other Brother        suicide  . Diabetes Mother   . Heart disease Mother   . Diabetes Sister   . Other Father        suicide  . Cancer Sister        breast  . Breast cancer Sister   . Other Other        colon resection due  to inflammation -nephew  . Bladder Cancer Neg Hx   . Kidney cancer Neg Hx      Allergies  Allergen Reactions  . Niacin And Related Hives  . Sertraline Hcl Other (See Comments)  Hallucinations,    . Sulfa Drugs Cross Reactors Nausea Only  . Tetracyclines & Related Hives  . Latex Rash    RAST testing was NEGATIVE  for LATEX  . Simvastatin Rash    *Antihyperlipidemics*; elevated LFT's.      REVIEW OF SYSTEMS(Negative unless checked)  Constitutional: [] ??Weight loss [] ??Fever [] ??Chills Cardiac: [] ??Chest pain [] ??Chest pressure [] ??Palpitations [] ??Shortness of breath when laying flat [] ??Shortness of breath at rest [x] ??Shortness of breath with exertion. Vascular: [] ??Pain in legs with walking [] ??Pain in legs at rest [] ??Pain in legs when laying flat [] ??Claudication [] ??Pain in feet when walking [] ??Pain in feet at rest [] ??Pain in feet when laying flat [] ??History of DVT [] ??Phlebitis [] ??Swelling in legs [] ??Varicose veins [] ??Non-healing ulcers Pulmonary: [] ??Uses home oxygen [] ??Productive cough [] ??Hemoptysis [] ??Wheeze [x] ??COPD [] ??Asthma Neurologic: [] ??Dizziness [] ??Blackouts [] ??Seizures [] ??History of stroke [] ??History of TIA [] ??Aphasia [] ??Temporary blindness [] ??Dysphagia [] ??Weakness or numbness in arms [x] ??Weakness or numbness in legs Musculoskeletal: [x] ??Arthritis [] ??Joint swelling [] ??Joint pain [] ??Low back pain Hematologic: [] ??Easy bruising [] ??Easy bleeding [] ??Hypercoagulable state [] ??Anemic  Gastrointestinal: [] ??Blood in stool [] ??Vomiting blood [] ??Gastroesophageal reflux/heartburn [] ??Abdominal pain Genitourinary: [] ??Chronic kidney disease [] ??Difficult urination [] ??Frequent urination [] ??Burning with urination [] ??Hematuria Skin: [] ??Rashes [] ??Ulcers [] ??Wounds Psychological: [] ??History of anxiety [] ??History of major depression.   Physical  Examination  BP (!) 166/72   Pulse 79   Ht 5\' 4"  (1.626 m)   Wt 151 lb (68.5 kg)   BMI 25.92 kg/m  Gen:  WD/WN, NAD Head: [place]/AT, No temporalis wasting. Ear/Nose/Throat: Hearing grossly intact, nares w/o erythema or drainage Eyes: Conjunctiva clear. Sclera non-icteric Neck: Supple.  Trachea midline Pulmonary:  Good air movement, no use of accessory muscles.  Cardiac: RRR, no JVD Vascular:  Vessel Right Left  Radial Palpable Palpable                          PT Palpable Palpable  DP Palpable Palpable   Gastrointestinal: soft, non-tender/non-distended. No guarding/reflex.  Musculoskeletal: M/S 5/5 throughout.  No deformity or atrophy. No edema. Neurologic: Sensation grossly intact in extremities.  Symmetrical.  Speech is fluent.  Psychiatric: Judgment intact, Mood & affect appropriate for pt's clinical situation. Dermatologic: No rashes or ulcers noted.  No cellulitis or open wounds.       Labs No results found for this or any previous visit (from the past 2160 hour(s)).  Radiology No results found.  Assessment/Plan  Controlled type 2 diabetes with neuropathy (HCC) blood glucose control important in reducing the progression of atherosclerotic disease. Also, involved in wound healing. On appropriate medications.   Hyperlipidemia lipid control important in reducing the progression of atherosclerotic disease. Continue statin therapy   Peripheral vascular disease Her ABIs were in the normal range at 1.06 on the right and 1.12 on the left.  Good waveforms and digital pressures distally.  Perfusion is maintained.  No role for intervention.  Recheck in 1 year  Bilateral carotid artery stenosis She had a carotid endarterectomy performed on the left almost 15 years ago.  This was done in [place].  Her surgeon there has recently retired.  He checked this earlier this year, but she does not have a follow-up going forward.  I told her we would be happy to follow this and we  will plan to see this with her next visit and add a duplex to her carotids.  AAA (abdominal aortic aneurysm) without rupture (HCC) Aortic duplex today shows a maximal diameter of 3.65 cm of her aorta which is stable from her previous  study last year.  No role for intervention at this size.  Continue annual follow-up.    Leotis Pain, MD  09/17/2020 11:41 AM    This note was created with Dragon medical transcription system.  Any errors from dictation are purely unintentional

## 2020-09-17 NOTE — Assessment & Plan Note (Signed)
Aortic duplex today shows a maximal diameter of 3.65 cm of her aorta which is stable from her previous study last year.  No role for intervention at this size.  Continue annual follow-up.

## 2020-09-17 NOTE — Assessment & Plan Note (Signed)
lipid control important in reducing the progression of atherosclerotic disease. Continue statin therapy  

## 2020-09-24 NOTE — Progress Notes (Deleted)
Office Visit Note  Patient: Sabrina Montoya             Date of Birth: [birth year]           MRN: [id number]             PCP: Crecencio Mc, MD Referring: Crecencio Mc, MD Visit Date: 10/08/2020 Occupation: '@GUAROCC' @  Subjective:  No chief complaint on file.   History of Present Illness: Sabrina Montoya is a 79 y.o. female ***   Activities of Daily Living:  Patient reports morning stiffness for *** {minute/hour:19697}.   Patient {ACTIONS;DENIES/REPORTS:21021675::"Denies"} nocturnal pain.  Difficulty dressing/grooming: {ACTIONS;DENIES/REPORTS:21021675::"Denies"} Difficulty climbing stairs: {ACTIONS;DENIES/REPORTS:21021675::"Denies"} Difficulty getting out of chair: {ACTIONS;DENIES/REPORTS:21021675::"Denies"} Difficulty using hands for taps, buttons, cutlery, and/or writing: {ACTIONS;DENIES/REPORTS:21021675::"Denies"}  No Rheumatology ROS completed.   PMFS History:  Patient Active Problem List   Diagnosis Date Noted  . Chronic thumb pain, right 05/15/2020  . Syncope 12/31/2019  . Radiculopathy of cervical region 09/14/2019  . Radicular pain in left arm 09/14/2019  . Arrhythmia, sinus node 05/29/2019  . Fatigue 06/07/2018  . Insomnia due to anxiety and fear 04/20/2018  . Chronic bladder pain 12/23/2017  . Candidiasis of skin 12/23/2017  . Irritable bowel syndrome (IBS) 12/23/2017  . Atrophic vaginitis 02/11/2017  . CKD (chronic kidney disease) stage 3, GFR 30-59 ml/min (HCC) 08/24/2016  . Dysphagia, pharyngoesophageal phase 06/20/2016  . Sciatica, left side 06/20/2016  . Controlled type 2 diabetes with neuropathy ([place]) 05/16/2016  . Preventative health care 02/06/2016  . Depression with somatization 01/27/2015  . Cervicalgia 01/09/2015  . Cystocele 08/29/2014  . Anemia 05/02/2014  . TIA (transient ischemic attack) 09/11/2013  . Overweight (BMI 25.0-29.9) 03/16/2013  . Bilateral carotid artery stenosis 03/03/2013  . Ulnar neuropathy of left upper extremity 03/03/2013  .  Chronic reflux esophagitis 12/29/2012  . Right hip pain 12/29/2012  . History of tobacco abuse 07/03/2012  . AAA (abdominal aortic aneurysm) without rupture ([place])   . CAD (coronary artery disease), autologous vein bypass graft   . Peripheral vascular disease ([place])   . Arthritis 02/29/2012  . Asymptomatic carotid artery stenosis 01/05/2012  . Hyperlipidemia   . Osteopenia 08/29/2011  . Vitamin D deficiency 08/29/2011    Past Medical History:  Diagnosis Date  . Abdominal aortic aneurysm without mention of rupture    infrarenal, stable, folllowed by Leotis Pain  . Acute posthemorrhagic anemia   . Arthritis   . B12 deficiency   . CAD (coronary artery disease), autologous vein bypass graft   . Cardiac dysrhythmia, unspecified   . Colon polyp   . Depression   . Diverticulitis of colon   . Dizziness    DUE TO MEDICINES  . Dysrhythmia   . GERD (gastroesophageal reflux disease)   . HOH (hard of hearing)   . Hyperlipidemia   . Hypertension    pt denies. placed on meds after CABG  . IBS (irritable bowel syndrome)   . Neuropathy   . Neuropathy 07/01/2013  . Peripheral vascular disease ([place])    s/p CEA   . Reflux esophagitis   . Rheumatic fever    possible at age 30  . Sliding hiatal hernia   . Tobacco abuse   . Tobacco abuse     Family History  Problem Relation Age of Onset  . Cancer Sister        cervical cancer  . Diabetes Sister   . Heart disease Brother        coronary artery  disease  . Other Brother        suicide  . Diabetes Mother   . Heart disease Mother   . Diabetes Sister   . Other Father        suicide  . Cancer Sister        breast  . Breast cancer Sister   . Other Other        colon resection due to inflammation -nephew  . Bladder Cancer Neg Hx   . Kidney cancer Neg Hx    Past Surgical History:  Procedure Laterality Date  . ABDOMINAL SURGERY  1976   for pain secondary to scar tissue, s/p apply  . APPENDECTOMY  1974  . BACK SURGERY    . CAROTID  ENDARTERECTOMY    . CATARACT EXTRACTION W/PHACO Right 10/18/2016   Procedure: CATARACT EXTRACTION PHACO AND INTRAOCULAR LENS PLACEMENT (IOC);  Surgeon: Estill Cotta, MD;  Location: [facility] OR[facility];  Service: Ophthalmology;  Laterality: Right;  Lot # [id number] H [place]: 01:04.0 AP%: 24.4 CDE: 30.44  . CATARACT EXTRACTION W/PHACO Left 03/14/2017   Procedure: CATARACT EXTRACTION PHACO AND INTRAOCULAR LENS PLACEMENT ([place])  Left;  Surgeon: Leandrew Koyanagi, MD;  Location: [facility];  Service: Ophthalmology;  Laterality: Left;  . CEA     Carotid stenosis found during workup for dysphagia,  [facility]  . 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 CHOLECYSTECTOMY  2002   Dr. Pat Patrick  . CLOSED MANIPULATION SHOULDER     left shoulder post physical therapy, Right shoulder redo (toggle bolts)  . COLONOSCOPY  09/01/14  . coronary angiography  01/2011   one occluded artery with collateralization, other arteries patent  . CORONARY ARTERY BYPASS GRAFT  2006   s/p triple bypass surgery, [facility]  . heart catherization  2016  . HERNIA REPAIR  may 2011   Dr. Pat Patrick  . hernia repair    . LUMBAR DISC SURGERY  1971   L5, unssuccessful, fusion in 1983 successful (2 lumbar)  . PARTIAL HYSTERECTOMY    . ROTATOR CUFF REPAIR  2002   right shoulder Dr. Nadean Corwin, [facility] in [place] History Narrative   Widowed   Immunization History  Administered Date(s) Administered  . Fluad Quad(high Dose 65+) 08/01/2019  . Influenza Split 10/10/2011, 08/26/2012  . Influenza, High Dose Seasonal PF 08/16/2016, 11/09/2017, 09/06/2018  . Influenza,inj,Quad PF,6+ Mos 07/25/2013, 07/28/2014, 08/13/2015  . Influenza-Unspecified 08/06/2012  . PFIZER SARS-COV-2 Vaccination 11/12/2019, 12/03/2019  . Pneumococcal Conjugate-13 08/26/2014  . Pneumococcal Polysaccharide-23 09/02/2010     Objective: Vital Signs: There  were no vitals taken for this visit.   Physical Exam   Musculoskeletal Exam: ***  CDAI Exam: CDAI Score: -- Patient Global: --; Provider Global: -- Swollen: --; Tender: -- Joint Exam 10/08/2020   No joint exam has been documented for this visit   There is currently no information documented on the homunculus. Go to the Rheumatology activity and complete the homunculus joint exam.  Investigation: No additional findings.  Imaging: VAS [place] ABI WITH/WO TBI  Result Date: 09/21/2020 LOWER EXTREMITY DOPPLER STUDY  Comparison Study: 09/13/2018 Performing Technologist: Charlane Ferretti RT (R)(VS)  Examination Guidelines: A complete evaluation includes at minimum, Doppler waveform signals and systolic blood pressure reading at the level of bilateral brachial, anterior tibial, and posterior tibial arteries, when vessel segments are accessible. Bilateral testing is considered an integral part  of a complete examination. Photoelectric Plethysmograph (PPG) waveforms and toe systolic pressure readings are included as required and additional duplex testing as needed. Limited examinations for reoccurring indications may be performed as noted.  ABI Findings: +---------+------------------+-----+---------+--------+ Right    Rt Pressure (mmHg)IndexWaveform Comment  +---------+------------------+-----+---------+--------+ Brachial 162                                      +---------+------------------+-----+---------+--------+ ATA      175               1.08 triphasic         +---------+------------------+-----+---------+--------+ PTA      154               0.95 triphasic         +---------+------------------+-----+---------+--------+ Great Toe157               0.97 Abnormal          +---------+------------------+-----+---------+--------+ +---------+------------------+-----+---------+-------+ Left     Lt Pressure (mmHg)IndexWaveform Comment  +---------+------------------+-----+---------+-------+ Brachial 161                                     +---------+------------------+-----+---------+-------+ ATA      174               1.07 triphasic        +---------+------------------+-----+---------+-------+ PTA      181               1.12 triphasic        +---------+------------------+-----+---------+-------+ Great Toe175               1.08 Abnormal         +---------+------------------+-----+---------+-------+ +-------+-----------+-----------+------------+------------+ ABI/TBIToday's ABIToday's TBIPrevious ABIPrevious TBI +-------+-----------+-----------+------------+------------+ Right  1.06       .97        1.16        .94          +-------+-----------+-----------+------------+------------+ Left   1.12       1.08       1.21        1.10         +-------+-----------+-----------+------------+------------+ Bilateral ABIs appear essentially unchanged compared to prior study on 09/13/2018. Bilateral TBIs appear essentially unchanged compared to prior study on 09/13/2018.  Summary: Right: Resting right ankle-brachial index is within normal range. No evidence of significant right lower extremity arterial disease. The right toe-brachial index is normal. Left: Resting left ankle-brachial index is within normal range. No evidence of significant left lower extremity arterial disease. The left toe-brachial index is normal. *See table(s) above for measurements and observations.  Electronically signed by Leotis Pain MD on 09/21/2020 at 8:26:44 AM.   Final    AAA Duplex  Result Date: 09/21/2020 ABDOMINAL AORTA STUDY Limitations: Air/bowel gas.  Comparison Study: 09/16/2019 Performing Technologist: Charlane Ferretti RT (R)(VS)  Examination Guidelines: A complete evaluation includes B-mode imaging, spectral Doppler, color Doppler, and power Doppler as needed of all accessible portions of each vessel. Bilateral testing is considered an  integral part of a complete examination. Limited examinations for reoccurring indications may be performed as noted.  Abdominal Aorta Findings: +-----------+-------+----------+----------+--------+--------+--------+ Location   AP (cm)Trans (cm)PSV (cm/s)WaveformThrombusComments +-----------+-------+----------+----------+--------+--------+--------+ Proximal   2.73   2.47      36                                 +-----------+-------+----------+----------+--------+--------+--------+  Mid        3.20   3.65      90                                 +-----------+-------+----------+----------+--------+--------+--------+ Distal     2.55   2.12      69                                 +-----------+-------+----------+----------+--------+--------+--------+ RT CIA Prox1.4    1.2       109                                +-----------+-------+----------+----------+--------+--------+--------+ LT CIA Prox1.0    1.1       109                                +-----------+-------+----------+----------+--------+--------+--------+  Summary: Abdominal Aorta: There is evidence of abnormal dilatation of the mid Abdominal aorta. The largest aortic measurement is 3.6 cm. The largest aortic diameter remains essentially unchanged compared to prior exam. Previous diameter measurement was 3.6 cm obtained on 09/16/2019.  *See table(s) above for measurements and observations.  Electronically signed by Leotis Pain MD on 09/21/2020 at 8:26:46 AM.   Final     Recent Labs: Lab Results  Component Value Date   WBC 4.2 04/18/2018   HGB 10.6 (L) 04/18/2018   PLT 211.0 04/18/2018   NA 140 05/13/2020   K 4.5 05/13/2020   CL 106 05/13/2020   CO2 27 05/13/2020   GLUCOSE 117 (H) 05/13/2020   BUN 29 (H) 05/13/2020   CREATININE 1.17 05/13/2020   BILITOT 0.5 05/13/2020   ALKPHOS 61 05/13/2020   AST 28 05/13/2020   ALT 14 05/13/2020   PROT 7.1 05/13/2020   ALBUMIN 4.8 05/13/2020   CALCIUM 9.8 05/13/2020    GFRAA 45 (L) 08/15/2017    Speciality Comments: No specialty comments available.  Procedures:  No procedures performed Allergies: Niacin and related, Sertraline hcl, Sulfa drugs cross reactors, Tetracyclines & related, Latex, and Simvastatin   Assessment / Plan:     Visit Diagnoses: Chronic thumb pain, right - ESR 17 on 05/13/20  Orders: No orders of the defined types were placed in this encounter.  No orders of the defined types were placed in this encounter.   Face-to-face time spent with patient was *** minutes. Greater than 50% of time was spent in counseling and coordination of care.  Follow-Up Instructions: No follow-ups on file.   Ofilia Neas, PA-C  Note - This record has been created using Dragon software.  Chart creation errors have been sought, but may not always  have been located. Such creation errors do not reflect on  the standard of medical care.

## 2020-10-07 ENCOUNTER — Telehealth: Payer: Self-pay

## 2020-10-07 NOTE — Telephone Encounter (Signed)
Patient called stating she is scheduled for a new patient appointment with Dr. Estanislado Pandy tomorrow 10/08/20 at 8:45 am.  Patient states she had x-rays of her hands last Spring and wants to make sure Dr. Derrel Nip included them with the referral.  Patient states "she doesn't want to waste time and money having additional x-rays if Dr. Estanislado Pandy can review the ones she already had."  Please advise.

## 2020-10-07 NOTE — Telephone Encounter (Signed)
Attempted to contact the patient and left message to advise patient we did not receive the x-ray results. Advised she would need to obtain a copy of results to bring to her appointment. Advised patient if the x-rays are more than a year old, Dr. Estanislado Pandy may want to repeat them.

## 2020-10-08 ENCOUNTER — Ambulatory Visit: Payer: 59 | Admitting: Rheumatology

## 2020-10-08 DIAGNOSIS — E1140 Type 2 diabetes mellitus with diabetic neuropathy, unspecified: Secondary | ICD-10-CM

## 2020-10-08 DIAGNOSIS — K2100 Gastro-esophageal reflux disease with esophagitis, without bleeding: Secondary | ICD-10-CM

## 2020-10-08 DIAGNOSIS — I714 Abdominal aortic aneurysm, without rupture: Secondary | ICD-10-CM

## 2020-10-08 DIAGNOSIS — Z87891 Personal history of nicotine dependence: Secondary | ICD-10-CM

## 2020-10-08 DIAGNOSIS — G459 Transient cerebral ischemic attack, unspecified: Secondary | ICD-10-CM

## 2020-10-08 DIAGNOSIS — G5622 Lesion of ulnar nerve, left upper limb: Secondary | ICD-10-CM

## 2020-10-08 DIAGNOSIS — I499 Cardiac arrhythmia, unspecified: Secondary | ICD-10-CM

## 2020-10-08 DIAGNOSIS — I25810 Atherosclerosis of coronary artery bypass graft(s) without angina pectoris: Secondary | ICD-10-CM

## 2020-10-08 DIAGNOSIS — F329 Major depressive disorder, single episode, unspecified: Secondary | ICD-10-CM

## 2020-10-08 DIAGNOSIS — K581 Irritable bowel syndrome with constipation: Secondary | ICD-10-CM

## 2020-10-08 DIAGNOSIS — M5412 Radiculopathy, cervical region: Secondary | ICD-10-CM

## 2020-10-08 DIAGNOSIS — I6523 Occlusion and stenosis of bilateral carotid arteries: Secondary | ICD-10-CM

## 2020-10-08 DIAGNOSIS — M8580 Other specified disorders of bone density and structure, unspecified site: Secondary | ICD-10-CM

## 2020-10-08 DIAGNOSIS — R1314 Dysphagia, pharyngoesophageal phase: Secondary | ICD-10-CM

## 2020-10-08 DIAGNOSIS — G8929 Other chronic pain: Secondary | ICD-10-CM

## 2020-10-08 DIAGNOSIS — N1831 Chronic kidney disease, stage 3a: Secondary | ICD-10-CM

## 2020-10-08 DIAGNOSIS — E559 Vitamin D deficiency, unspecified: Secondary | ICD-10-CM

## 2020-10-08 DIAGNOSIS — I739 Peripheral vascular disease, unspecified: Secondary | ICD-10-CM

## 2020-10-12 DIAGNOSIS — M1812 Unilateral primary osteoarthritis of first carpometacarpal joint, left hand: Secondary | ICD-10-CM | POA: Diagnosis not present

## 2020-10-12 DIAGNOSIS — M8949 Other hypertrophic osteoarthropathy, multiple sites: Secondary | ICD-10-CM | POA: Diagnosis not present

## 2020-10-12 DIAGNOSIS — M654 Radial styloid tenosynovitis [de Quervain]: Secondary | ICD-10-CM | POA: Diagnosis not present

## 2020-10-13 ENCOUNTER — Encounter (INDEPENDENT_AMBULATORY_CARE_PROVIDER_SITE_OTHER): Payer: Self-pay

## 2020-11-08 ENCOUNTER — Ambulatory Visit: Payer: Medicare Other | Attending: Rheumatology | Admitting: Occupational Therapy

## 2021-02-11 DIAGNOSIS — H353132 Nonexudative age-related macular degeneration, bilateral, intermediate dry stage: Secondary | ICD-10-CM | POA: Diagnosis not present

## 2021-02-14 ENCOUNTER — Other Ambulatory Visit: Payer: Self-pay | Admitting: Internal Medicine

## 2021-02-14 ENCOUNTER — Telehealth: Payer: Self-pay | Admitting: Internal Medicine

## 2021-02-14 DIAGNOSIS — Z1231 Encounter for screening mammogram for malignant neoplasm of breast: Secondary | ICD-10-CM

## 2021-02-14 NOTE — Telephone Encounter (Signed)
Pt called she needs orders placed for a mammogram to [facility]

## 2021-02-15 NOTE — Telephone Encounter (Signed)
Mammogram has been ordered. Left a detailed message letting pt know that she can call [facility] to schedule the appt at any time.

## 2021-02-15 NOTE — Addendum Note (Signed)
Addended by: Adair Laundry on: 02/15/2021 09:42 AM   Modules accepted: Orders

## 2021-02-17 DIAGNOSIS — L720 Epidermal cyst: Secondary | ICD-10-CM | POA: Diagnosis not present

## 2021-02-17 DIAGNOSIS — L905 Scar conditions and fibrosis of skin: Secondary | ICD-10-CM | POA: Diagnosis not present

## 2021-02-17 DIAGNOSIS — Z85828 Personal history of other malignant neoplasm of skin: Secondary | ICD-10-CM | POA: Diagnosis not present

## 2021-02-17 DIAGNOSIS — L821 Other seborrheic keratosis: Secondary | ICD-10-CM | POA: Diagnosis not present

## 2021-03-02 ENCOUNTER — Other Ambulatory Visit: Payer: Self-pay | Admitting: Internal Medicine

## 2021-03-16 ENCOUNTER — Telehealth (INDEPENDENT_AMBULATORY_CARE_PROVIDER_SITE_OTHER): Payer: Self-pay

## 2021-03-16 NOTE — Telephone Encounter (Signed)
The patient should see her PCP.  Carotids do not cause ear pain, soreness or neck swelling.  She should see her PCP for evaluation and we can evaluate after if Dr. Derrel Nip feels that further workup is warranted.

## 2021-03-16 NOTE — Telephone Encounter (Signed)
Left a message on patient voicemail to return a call to the office 

## 2021-03-16 NOTE — Telephone Encounter (Signed)
Patient has been made aware with medical recommendations and verbalized understanding

## 2021-03-17 ENCOUNTER — Ambulatory Visit (INDEPENDENT_AMBULATORY_CARE_PROVIDER_SITE_OTHER): Payer: Medicare Other | Admitting: Adult Health

## 2021-03-17 ENCOUNTER — Telehealth: Payer: Self-pay | Admitting: Internal Medicine

## 2021-03-17 ENCOUNTER — Encounter: Payer: Self-pay | Admitting: Adult Health

## 2021-03-17 ENCOUNTER — Other Ambulatory Visit: Payer: Self-pay | Admitting: Internal Medicine

## 2021-03-17 ENCOUNTER — Other Ambulatory Visit: Payer: Self-pay

## 2021-03-17 VITALS — BP 126/74 | HR 73 | Temp 98.6°F | Ht 64.02 in | Wt 150.6 lb

## 2021-03-17 DIAGNOSIS — M62838 Other muscle spasm: Secondary | ICD-10-CM

## 2021-03-17 DIAGNOSIS — K0889 Other specified disorders of teeth and supporting structures: Secondary | ICD-10-CM | POA: Diagnosis not present

## 2021-03-17 DIAGNOSIS — H6502 Acute serous otitis media, left ear: Secondary | ICD-10-CM

## 2021-03-17 DIAGNOSIS — Z20822 Contact with and (suspected) exposure to covid-19: Secondary | ICD-10-CM | POA: Diagnosis not present

## 2021-03-17 DIAGNOSIS — E1140 Type 2 diabetes mellitus with diabetic neuropathy, unspecified: Secondary | ICD-10-CM

## 2021-03-17 DIAGNOSIS — H6501 Acute serous otitis media, right ear: Secondary | ICD-10-CM | POA: Insufficient documentation

## 2021-03-17 DIAGNOSIS — E7800 Pure hypercholesterolemia, unspecified: Secondary | ICD-10-CM

## 2021-03-17 MED ORDER — TIZANIDINE HCL 4 MG PO TABS: 4.0000 mg | ORAL_TABLET | Freq: Three times a day (TID) | ORAL | 0 refills | Status: DC | PRN

## 2021-03-17 MED ORDER — PREDNISONE 10 MG (21) PO TBPK: ORAL_TABLET | ORAL | 0 refills | Status: DC

## 2021-03-17 MED ORDER — AMOXICILLIN 875 MG PO TABS: 875.0000 mg | ORAL_TABLET | Freq: Two times a day (BID) | ORAL | 0 refills | Status: AC

## 2021-03-17 NOTE — Telephone Encounter (Signed)
Patient made an appt with Dr Derrel Nip on 05/04/21. Patient would like to do labs before seeing her. No lab orders are in patient's chart.

## 2021-03-17 NOTE — Patient Instructions (Signed)
Muscle Cramps and Spasms Muscle cramps and spasms are when muscles tighten by themselves. They usually get better within minutes. Muscle cramps are painful. They are usually stronger and last longer than muscle spasms. Muscle spasms may or may not be painful. They can last a few seconds or much longer. Cramps and spasms can affect any muscle, but they occur most often in the calf muscles of the leg. They are usually not caused by a serious problem. In many cases, the cause is not known. Some common causes include:  Doing more physical work or exercise than your body is ready for.  Using the muscles too much (overuse) by repeating certain movements too many times.  Staying in a certain position for a long time.  Playing a sport or doing an activity without preparing properly.  Using bad form or technique while playing a sport or doing an activity.  Not having enough water in your body (dehydration).  Injury.  Side effects of some medicines.  Low levels of the salts and minerals in your blood (electrolytes), such as low potassium or calcium. Follow these instructions at home: Managing pain and stiffness  Massage, stretch, and relax the muscle. Do this for many minutes at a time.  If told, put heat on tight or tense muscles as often as told by your doctor. Use the heat source that your doctor recommends, such as a moist heat pack or a heating pad. ? Place a towel between your skin and the heat source. ? Leave the heat on for 20-30 minutes. ? Remove the heat if your skin turns bright red. This is very important if you are not able to feel pain, heat, or cold. You may have a greater risk of getting burned.  If told, put ice on the affected area. This may help if you are sore or have pain after a cramp or spasm. ? Put ice in a plastic bag. ? Place a towel between your skin and the bag. ? Leave the ice on for 20 minutes, 2-3 times a day.  Try taking hot showers or baths to help relax  tight muscles.      Eating and drinking  Drink enough fluid to keep your pee (urine) pale yellow.  Eat a healthy diet to help ensure that your muscles work well. This should include: ? Fruits and vegetables. ? Lean protein. ? Whole grains. ? Low-fat or nonfat dairy products. General instructions  If you are having cramps often, avoid intense exercise for several days.  Take over-the-counter and prescription medicines only as told by your doctor.  Watch for any changes in your symptoms.  Keep all follow-up visits as told by your doctor. This is important. Contact a doctor if:  Your cramps or spasms get worse or happen more often.  Your cramps or spasms do not get better with time. Summary  Muscle cramps and spasms are when muscles tighten by themselves. They usually get better within minutes.  Cramps and spasms occur most often in the calf muscles of the leg.  Massage, stretch, and relax the muscle. This may help the cramp or spasm go away.  Drink enough fluid to keep your pee (urine) pale yellow. This information is not intended to replace advice given to you by your health care provider. Make sure you discuss any questions you have with your health care provider. Document Revised: 03/18/2018 Document Reviewed: 03/18/2018 Elsevier Patient Education  2021 [place]. Otitis Media, Adult  Otitis media is a  condition in which the middle ear is red and swollen (inflamed) and full of fluid. The middle ear is the part of the ear that contains bones for hearing as well as air that helps send sounds to the brain. The condition usually goes away on its own. What are the causes? This condition is caused by a blockage in the eustachian tube. The eustachian tube connects the middle ear to the back of the nose. It normally allows air into the middle ear. The blockage is caused by fluid or swelling. Problems that can cause blockage include:  A cold or infection that affects the nose,  mouth, or throat.  Allergies.  An irritant, such as tobacco smoke.  Adenoids that have become large. The adenoids are soft tissue located in the back of the throat, behind the nose and the roof of the mouth.  Growth or swelling in the upper part of the throat, just behind the nose (nasopharynx).  Damage to the ear caused by change in pressure. This is called barotrauma. What are the signs or symptoms? Symptoms of this condition include:  Ear pain.  Fever.  Problems with hearing.  Being tired.  Fluid leaking from the ear.  Ringing in the ear. How is this treated? This condition can go away on its own within 3-5 days. But if the condition is caused by bacteria or does not go away on its own, or if it keeps coming back, your doctor may:  Give you antibiotic medicines.  Give you medicines for pain. Follow these instructions at home:  Take over-the-counter and prescription medicines only as told by your doctor.  If you were prescribed an antibiotic medicine, take it as told by your doctor. Do not stop taking the antibiotic even if you start to feel better.  Keep all follow-up visits as told by your doctor. This is important. Contact a doctor if:  You have bleeding from your nose.  There is a lump on your neck.  You are not feeling better in 5 days.  You feel worse instead of better. Get help right away if:  You have pain that is not helped with medicine.  You have swelling, redness, or pain around your ear.  You get a stiff neck.  You cannot move part of your face (paralysis).  You notice that the bone behind your ear hurts when you touch it.  You get a very bad headache. Summary  Otitis media means that the middle ear is red, swollen, and full of fluid.  This condition usually goes away on its own.  If the problem does not go away, treatment may be needed. You may be given medicines to treat the infection or to treat your pain.  If you were prescribed  an antibiotic medicine, take it as told by your doctor. Do not stop taking the antibiotic even if you start to feel better.  Keep all follow-up visits as told by your doctor. This is important. This information is not intended to replace advice given to you by your health care provider. Make sure you discuss any questions you have with your health care provider. Document Revised: 09/25/2019 Document Reviewed: 09/25/2019 Elsevier Patient Education  2021 [employer].

## 2021-03-17 NOTE — Progress Notes (Signed)
Acute Office Visit  Subjective:    Patient ID: Sabrina Montoya, female    DOB: 1941-08-08, 80 y.o.   MRN: [id number]  Chief Complaint  Patient presents with  . Neck Pain    Pt c/o right side neck pain x3-4weeks. Pt had 3 unit bridge dental surgery and had right sided dental pain.     Neck Pain  Pertinent negatives include no trouble swallowing.   Patient is in today for neck pain, she says she had pain with turning her head.  For the last 3- 4 weeks she has had a metallic taste in her mouth and was having  Pain when biting. She does have some ear pain in left ear.  She was going to call the dentist because she has two bridges upper and lower. She does notice waking up clinching her teeth.  Denies any injury.  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 Turning neck was more painful yesterday than today. Denies any injury or trauma.   2007 carotid surgery. - seeing Dr. Lucky Cowboy.   Patient  denies any fever, body aches,chills, rash, chest pain, shortness of breath, nausea, vomiting, or diarrhea.  Denies dizziness, lightheadedness, pre syncopal or syncopal episodes.    Past Medical History:  Diagnosis Date  . Abdominal aortic aneurysm without mention of rupture    infrarenal, stable, folllowed by Leotis Pain  . Acute posthemorrhagic anemia   . Arthritis   . B12 deficiency   . CAD (coronary artery disease), autologous vein bypass graft   . Cardiac dysrhythmia, unspecified   . Colon polyp   . Depression   . Diverticulitis of colon   . Dizziness    DUE TO MEDICINES  . Dysrhythmia   . GERD (gastroesophageal reflux disease)   . HOH (hard of hearing)   . Hyperlipidemia   . Hypertension    pt denies. placed on meds after CABG  . IBS (irritable bowel syndrome)   . Neuropathy   . Neuropathy 07/01/2013  . Peripheral vascular disease ([place])    s/p CEA   . Reflux  esophagitis   . Rheumatic fever    possible at age 59  . Sliding hiatal hernia   . Tobacco abuse   . Tobacco abuse     Past Surgical History:  Procedure Laterality Date  . ABDOMINAL SURGERY  1976   for pain secondary to scar tissue, s/p apply  . APPENDECTOMY  1974  . BACK SURGERY    . CAROTID ENDARTERECTOMY    . CATARACT EXTRACTION W/PHACO Right 10/18/2016   Procedure: CATARACT EXTRACTION PHACO AND INTRAOCULAR LENS PLACEMENT (IOC);  Surgeon: Estill Cotta, MD;  Location: [facility];  Service: Ophthalmology;  Laterality: Right;  Lot # [id number] H [place]: 01:04.0 AP%: 24.4 CDE: 30.44  . CATARACT EXTRACTION W/PHACO Left 03/14/2017   Procedure: CATARACT EXTRACTION PHACO AND INTRAOCULAR LENS PLACEMENT ([place])  Left;  Surgeon: Leandrew Koyanagi, MD;  Location: [facility];  Service: Ophthalmology;  Laterality: Left;  . CEA     Carotid stenosis found during workup for dysphagia,  [facility]  . 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 CHOLECYSTECTOMY  2002   Dr. Pat Patrick  . CLOSED MANIPULATION SHOULDER     left shoulder post physical therapy, Right shoulder redo (toggle bolts)  . COLONOSCOPY  09/01/14  .  coronary angiography  01/2011   one occluded artery with collateralization, other arteries patent  . CORONARY ARTERY BYPASS GRAFT  2006   s/p triple bypass surgery, [facility]  . heart catherization  2016  . HERNIA REPAIR  may 2011   Dr. Pat Patrick  . hernia repair    . LUMBAR DISC SURGERY  1971   L5, unssuccessful, fusion in 1983 successful (2 lumbar)  . PARTIAL HYSTERECTOMY    . ROTATOR CUFF REPAIR  2002   right shoulder Dr. Nadean Corwin, [facility] in [place]  . VASCULAR SURGERY      Family History  Problem Relation Age of Onset  . Cancer Sister        cervical cancer  . Diabetes Sister   . Heart disease Brother        coronary artery disease  . Other Brother        suicide  . Diabetes Mother   . Heart disease Mother    . Diabetes Sister   . Other Father        suicide  . Cancer Sister        breast  . Breast cancer Sister   . Other Other        colon resection due to inflammation -nephew  . Bladder Cancer Neg Hx   . Kidney cancer Neg Hx     Social History   Socioeconomic History  . Marital status: Widowed    Spouse name: Not on file  . Number of children: Not on file  . Years of education: Not on file  . Highest education level: Not on file  Occupational History  . Not on file  Tobacco Use  . Smoking status: Former Smoker    Packs/day: 0.50    Years: 30.00    Pack years: 15.00    Types: Cigarettes    Quit date: 04/01/2012    Years since quitting: 8.9  . Smokeless tobacco: Never Used  . Tobacco comment: quit for 2 years after sinus infection and 6 months after heart surgery  Vaping Use  . Vaping Use: Never used  Substance and Sexual Activity  . Alcohol use: No  . Drug use: No  . Sexual activity: Not Currently  Other Topics Concern  . Not on file  Social History Narrative   Widowed   Social Determinants of Health   Financial Resource Strain: Not on file  Food Insecurity: Not on file  Transportation Needs: Not on file  Physical Activity: Not on file  Stress: Not on file  Social Connections: Not on file  Intimate Partner Violence: Not on file    Outpatient Medications Prior to Visit  Medication Sig Dispense Refill  . aspirin 81 MG tablet Take 81 mg by mouth daily.    . cholecalciferol (VITAMIN D) 1000 UNITS tablet Take 1,000 Units by mouth daily.    . Cyanocobalamin (VITAMIN B12 PO) Take 1 Dose by mouth daily. GUMMY CHEWS; 3,000 mg    . isosorbide mononitrate (IMDUR) 30 MG 24 hr tablet Take 30 mg by mouth daily.    . Multiple Vitamins-Minerals (CENTRUM SILVER 50+WOMEN PO) Take 1 Dose by mouth daily.    . Multiple Vitamins-Minerals (PRESERVISION AREDS 2 PO) Take by mouth 2 (two) times daily.    Marland Kitchen omeprazole (PRILOSEC) 20 MG capsule TAKE 1 CAPSULE(20 MG) BY MOUTH DAILY 90  capsule 1  . rosuvastatin (CRESTOR) 40 MG tablet Take 1 tablet (40 mg total) by mouth daily. 90 tablet 0  .  Turmeric (QC TUMERIC COMPLEX PO) Take 1 mg by mouth daily.    Marland Kitchen conjugated estrogens (PREMARIN) vaginal cream Place vaginally. (Patient not taking: Reported on 03/17/2021)    . triamcinolone cream (KENALOG) 0.1 % Apply topically. (Patient not taking: Reported on 03/17/2021)    . predniSONE (DELTASONE) 10 MG tablet 6 tablets daily for 3 days, then reduce by 1 tablet daily until gone (Patient not taking: No sig reported) 33 tablet 0  . tiZANidine (ZANAFLEX) 4 MG tablet Take 1 tablet (4 mg total) by mouth every 6 (six) hours as needed for muscle spasms. (Patient not taking: Reported on 03/17/2021) 30 tablet 0   No facility-administered medications prior to visit.    Allergies  Allergen Reactions  . Niacin And Related Hives  . Sertraline Hcl Other (See Comments)    Hallucinations,    . Sulfa Drugs Cross Reactors Nausea Only  . Tetracyclines & Related Hives  . Latex Rash    RAST testing was NEGATIVE  for LATEX  . Simvastatin Rash    *Antihyperlipidemics*; elevated LFT's.     Review of Systems  Constitutional: Negative.   HENT: Positive for dental problem. Negative for congestion, drooling, ear discharge, ear pain, facial swelling, hearing loss, mouth sores, nosebleeds, postnasal drip, rhinorrhea, sinus pressure, sinus pain, sneezing, sore throat, tinnitus, trouble swallowing and voice change.   Respiratory: Negative.   Cardiovascular: Negative.   Gastrointestinal: Negative.   Genitourinary: Negative.   Musculoskeletal: Positive for myalgias and neck pain. Negative for arthralgias, back pain, gait problem, joint swelling and neck stiffness.  Psychiatric/Behavioral: Negative.        Objective:    Physical Exam Vitals reviewed.  Constitutional:      General: She is not in acute distress.    Appearance: Normal appearance. She is not ill-appearing, toxic-appearing or diaphoretic.   HENT:     Head: Normocephalic and atraumatic.     Right Ear: Hearing, tympanic membrane, ear canal and external ear normal. No swelling or tenderness. There is no impacted cerumen.     Left Ear: Hearing and external ear normal. No swelling or tenderness. A middle ear effusion is present. There is no impacted cerumen. No foreign body. No mastoid tenderness. Tympanic membrane is erythematous. Tympanic membrane is not perforated, retracted or bulging. Tympanic membrane has normal mobility.     Nose: No congestion or rhinorrhea.     Mouth/Throat:     Lips: Pink.     Mouth: Mucous membranes are moist.     Pharynx: Oropharynx is clear. No pharyngeal swelling, oropharyngeal exudate, posterior oropharyngeal erythema or uvula swelling.     Tonsils: No tonsillar exudate or tonsillar abscesses.      Comments: Pain noted at location of bridges, no obvious abnormality, gums within normal limits from what can be visualized.  Eyes:     General: No scleral icterus.       Right eye: No discharge.        Left eye: No discharge.     Extraocular Movements: Extraocular movements intact.     Conjunctiva/sclera: Conjunctivae normal.     Pupils: Pupils are equal, round, and reactive to light.  Neck:     Vascular: No carotid bruit.  Cardiovascular:     Rate and Rhythm: Regular rhythm.     Pulses: Normal pulses.     Heart sounds: Normal heart sounds. No murmur heard. No friction rub. No gallop.   Pulmonary:     Effort: Pulmonary effort is normal. No respiratory distress.  Breath sounds: Normal breath sounds. No stridor. No wheezing, rhonchi or rales.  Chest:     Chest wall: No tenderness.  Abdominal:     Palpations: Abdomen is soft.  Musculoskeletal:        General: Tenderness present. No signs of injury.     Right shoulder: Normal.     Left shoulder: Normal.     Cervical back: Normal range of motion and neck supple. Spasms (muscle spasm as marked on diagram. ) and tenderness present. No rigidity,  torticollis or bony tenderness. Pain with movement (feels tight with movement has no bone tenderness ) present. Normal range of motion.     Thoracic back: Normal.     Lumbar back: Normal.       Back:     Right lower leg: No edema.     Left lower leg: No edema.  Lymphadenopathy:     Cervical: No cervical adenopathy.  Skin:    General: Skin is warm.  Neurological:     Mental Status: She is alert and oriented to person, place, and time.  Psychiatric:        Mood and Affect: Mood normal.        Behavior: Behavior normal.        Thought Content: Thought content normal.        Judgment: Judgment normal.     BP 126/74 (BP Location: Left Arm, Patient Position: Sitting)   Pulse 73   Temp 98.6 F (37 C)   Ht 5' 4.02" (1.626 m)   Wt 150 lb 9.6 oz (68.3 kg)   SpO2 96%   BMI 25.84 kg/m  Wt Readings from Last 3 Encounters:  03/17/21 150 lb 9.6 oz (68.3 kg)  09/17/20 151 lb (68.5 kg)  05/13/20 151 lb 9.6 oz (68.8 kg)    Health Maintenance Due  Topic Date Due  . OPHTHALMOLOGY EXAM  04/28/2020  . URINE MICROALBUMIN  05/19/2020  . MAMMOGRAM  07/29/2020  . FOOT EXAM  09/11/2020  . HEMOGLOBIN A1C  11/13/2020    There are no preventive care reminders to display for this patient.   Lab Results  Component Value Date   TSH 1.64 06/02/2019   Lab Results  Component Value Date   WBC 4.2 04/18/2018   HGB 10.6 (L) 04/18/2018   HCT 31.0 (L) 04/18/2018   MCV 86.4 04/18/2018   PLT 211.0 04/18/2018   Lab Results  Component Value Date   NA 140 05/13/2020   K 4.5 05/13/2020   CO2 27 05/13/2020   GLUCOSE 117 (H) 05/13/2020   BUN 29 (H) 05/13/2020   CREATININE 1.17 05/13/2020   BILITOT 0.5 05/13/2020   ALKPHOS 61 05/13/2020   AST 28 05/13/2020   ALT 14 05/13/2020   PROT 7.1 05/13/2020   ALBUMIN 4.8 05/13/2020   CALCIUM 9.8 05/13/2020   ANIONGAP 12 08/15/2017   GFR 44.74 (L) 05/13/2020   Lab Results  Component Value Date   CHOL 119 05/13/2020   Lab Results  Component  Value Date   HDL 38.20 (L) 05/13/2020   Lab Results  Component Value Date   LDLCALC 44 05/13/2020   Lab Results  Component Value Date   TRIG 186.0 (H) 05/13/2020   Lab Results  Component Value Date   CHOLHDL 3 05/13/2020   Lab Results  Component Value Date   HGBA1C 6.6 (H) 05/13/2020       Assessment & Plan:   Problem List Items Addressed This Visit  Nervous and Auditory   Non-recurrent acute serous otitis media of left ear   Relevant Medications   amoxicillin (AMOXIL) 875 MG tablet     Other   Pain, dental   Muscle spasm - Primary   Relevant Medications   predniSONE (STERAPRED UNI-PAK 21 TAB) 10 MG (21) TBPK tablet   tiZANidine (ZANAFLEX) 4 MG tablet     Given dental pain will treat with Amoxicillin to cover possible dental infection and will cover otitis media left ear.  She also has muscle stiffness and tightness with movement. Will try prednisone dose pack and Zanaflex as prescribed.    Meds ordered this encounter  Medications  . amoxicillin (AMOXIL) 875 MG tablet    Sig: Take 1 tablet (875 mg total) by mouth 2 (two) times daily for 10 days.    Dispense:  20 tablet    Refill:  0  . predniSONE (STERAPRED UNI-PAK 21 TAB) 10 MG (21) TBPK tablet    Sig: PO: Take 6 tablets on day 1:Take 5 tablets day 2:Take 4 tablets day 3: Take 3 tablets day 4:Take 2 tablets day five: 5 Take 1 tablet day 6    Dispense:  21 tablet    Refill:  0  . tiZANidine (ZANAFLEX) 4 MG tablet    Sig: Take 1 tablet (4 mg total) by mouth every 8 (eight) hours as needed for muscle spasms (will cause drowsiness.).    Dispense:  30 tablet    Refill:  0    Return precautions given. Red Flags discussed. The patient was given clear instructions to go to ER or return to medical center if any red flags develop, symptoms do not improve, worsen or new problems develop. They verbalized understanding.    Risks, benefits, and alternatives of the medications and treatment plan prescribed today  were discussed, and patient expressed understanding.    Education regarding symptom management and diagnosis given to patient on AVS.  Patient was in agreement with treatment plan.   Continue to follow with  Kelby Aline. Rendell Thivierge AGNP-C, FNP-C for routine health maintenance.   Kelby Aline. Sakia Schrimpf AGNP-C, FNP-C Marcille Buffy, FNP

## 2021-03-17 NOTE — Telephone Encounter (Signed)
Pt would like to have labs done prior to her appt on 05/04/2021. I have ordered A1c, CMP, and Lipid Panel. Is there anything else that needs to be ordered?

## 2021-03-18 ENCOUNTER — Ambulatory Visit
Admission: RE | Admit: 2021-03-18 | Discharge: 2021-03-18 | Disposition: A | Discharge status: Home / Self Care | Payer: Medicare Other | Source: Ambulatory Visit | Attending: Internal Medicine | Admitting: Internal Medicine

## 2021-03-18 DIAGNOSIS — Z1231 Encounter for screening mammogram for malignant neoplasm of breast: Secondary | ICD-10-CM

## 2021-03-23 DIAGNOSIS — I25119 Atherosclerotic heart disease of native coronary artery with unspecified angina pectoris: Secondary | ICD-10-CM | POA: Diagnosis not present

## 2021-03-23 DIAGNOSIS — I714 Abdominal aortic aneurysm, without rupture: Secondary | ICD-10-CM | POA: Diagnosis not present

## 2021-03-23 DIAGNOSIS — I10 Essential (primary) hypertension: Secondary | ICD-10-CM | POA: Diagnosis not present

## 2021-03-23 DIAGNOSIS — I6523 Occlusion and stenosis of bilateral carotid arteries: Secondary | ICD-10-CM | POA: Diagnosis not present

## 2021-04-22 ENCOUNTER — Encounter: Payer: Self-pay | Admitting: Internal Medicine

## 2021-04-27 ENCOUNTER — Other Ambulatory Visit: Payer: Self-pay

## 2021-04-27 ENCOUNTER — Other Ambulatory Visit (INDEPENDENT_AMBULATORY_CARE_PROVIDER_SITE_OTHER): Payer: Medicare Other

## 2021-04-27 ENCOUNTER — Ambulatory Visit (INDEPENDENT_AMBULATORY_CARE_PROVIDER_SITE_OTHER): Payer: Medicare Other

## 2021-04-27 VITALS — BP 162/77 | HR 77 | Temp 98.3°F | Resp 14 | Ht 64.02 in | Wt 150.4 lb

## 2021-04-27 DIAGNOSIS — E1140 Type 2 diabetes mellitus with diabetic neuropathy, unspecified: Secondary | ICD-10-CM

## 2021-04-27 DIAGNOSIS — Z0000 Encounter for general adult medical examination without abnormal findings: Secondary | ICD-10-CM | POA: Diagnosis not present

## 2021-04-27 DIAGNOSIS — E7800 Pure hypercholesterolemia, unspecified: Secondary | ICD-10-CM | POA: Diagnosis not present

## 2021-04-27 LAB — COMPREHENSIVE METABOLIC PANEL
ALT: 16 U/L (ref 0–35)
AST: 36 U/L (ref 0–37)
Albumin: 4.8 g/dL (ref 3.5–5.2)
Alkaline Phosphatase: 71 U/L (ref 39–117)
BUN: 24 mg/dL — ABNORMAL HIGH (ref 6–23)
CO2: 28 mEq/L (ref 19–32)
Calcium: 9.9 mg/dL (ref 8.4–10.5)
Chloride: 106 mEq/L (ref 96–112)
Creatinine, Ser: 1.02 mg/dL (ref 0.40–1.20)
GFR: 52.54 mL/min — ABNORMAL LOW (ref >60.00)
Glucose, Bld: 123 mg/dL — ABNORMAL HIGH (ref 70–99)
Potassium: 4.5 mEq/L (ref 3.5–5.1)
Sodium: 142 mEq/L (ref 135–145)
Total Bilirubin: 0.6 mg/dL (ref 0.2–1.2)
Total Protein: 7.2 g/dL (ref 6.0–8.3)

## 2021-04-27 LAB — LIPID PANEL
Cholesterol: 141 mg/dL (ref 0–200)
HDL: 44.6 mg/dL (ref >39.00)
LDL Cholesterol: 63 mg/dL (ref 0–99)
NonHDL: 96.39
Total CHOL/HDL Ratio: 3
Triglycerides: 169 mg/dL — ABNORMAL HIGH (ref 0.0–149.0)
VLDL: 33.8 mg/dL (ref 0.0–40.0)

## 2021-04-27 LAB — MICROALBUMIN / CREATININE URINE RATIO
Creatinine,U: 77.1 mg/dL
Microalb Creat Ratio: 3.2 mg/g (ref 0.0–30.0)
Microalb, Ur: 2.5 mg/dL — ABNORMAL HIGH (ref 0.0–1.9)

## 2021-04-27 LAB — HEMOGLOBIN A1C: Hgb A1c MFr Bld: 7.3 % — ABNORMAL HIGH (ref 4.6–6.5)

## 2021-04-27 NOTE — Progress Notes (Addendum)
Subjective:   Sabrina Montoya is a 80 y.o. female who presents for Medicare Annual (Subsequent) preventive examination.  Review of Systems    No ROS.  Medicare Wellness Cardiac Risk Factors include: advanced age (>54men, >29 women)     Objective:    Today's Vitals   04/27/21 1119  BP: (!) 162/77  Pulse: 77  Resp: 14  Temp: 98.3 F (36.8 C)  TempSrc: Oral  SpO2: 98%  Weight: 150 lb 6.4 oz (68.2 kg)  Height: 5' 4.02" (1.626 m)   Body mass index is 25.8 kg/m.  Advanced Directives 04/27/2021 04/26/2020 04/24/2019 08/05/2018 02/12/2018 08/15/2017 03/14/2017  Does Patient Have a Medical Advance Directive? No No No No No No No  Type of Advance Directive - - - - - - -  Copy of Healthcare Power of Attorney in Chart? - - - - - - -  Would patient like information on creating a medical advance directive? No - Patient declined No - Patient declined No - Patient declined - No - Patient declined - Yes (MAU/Ambulatory/Procedural Areas - Information given)    Current Medications (verified) Outpatient Encounter Medications as of 04/27/2021  Medication Sig   aspirin 81 MG tablet Take 81 mg by mouth daily.   cholecalciferol (VITAMIN D) 1000 UNITS tablet Take 1,000 Units by mouth daily.   conjugated estrogens (PREMARIN) vaginal cream Place vaginally. (Patient not taking: Reported on 03/17/2021)   Cyanocobalamin (VITAMIN B12 PO) Take 1 Dose by mouth daily. GUMMY CHEWS; 3,000 mg   isosorbide mononitrate (IMDUR) 30 MG 24 hr tablet Take 30 mg by mouth daily.   Multiple Vitamins-Minerals (CENTRUM SILVER 50+WOMEN PO) Take 1 Dose by mouth daily.   Multiple Vitamins-Minerals (PRESERVISION AREDS 2 PO) Take by mouth 2 (two) times daily.   omeprazole (PRILOSEC) 20 MG capsule TAKE 1 CAPSULE(20 MG) BY MOUTH DAILY   rosuvastatin (CRESTOR) 40 MG tablet Take 1 tablet (40 mg total) by mouth daily.   tiZANidine (ZANAFLEX) 4 MG tablet Take 1 tablet (4 mg total) by mouth every 8 (eight) hours as needed for muscle spasms  (will cause drowsiness.).   triamcinolone cream (KENALOG) 0.1 % Apply topically. (Patient not taking: Reported on 03/17/2021)   Turmeric (QC TUMERIC COMPLEX PO) Take 1 mg by mouth daily.   [DISCONTINUED] predniSONE (STERAPRED UNI-PAK 21 TAB) 10 MG (21) TBPK tablet PO: Take 6 tablets on day 1:Take 5 tablets day 2:Take 4 tablets day 3: Take 3 tablets day 4:Take 2 tablets day five: 5 Take 1 tablet day 6   No facility-administered encounter medications on file as of 04/27/2021.    Allergies (verified) Niacin and related, Sertraline hcl, Sulfa drugs cross reactors, Tetracyclines & related, Latex, and Simvastatin   History: Past Medical History:  Diagnosis Date   Abdominal aortic aneurysm without mention of rupture    infrarenal, stable, folllowed by Leotis Pain   Acute posthemorrhagic anemia    Arthritis    B12 deficiency    CAD (coronary artery disease), autologous vein bypass graft    Cardiac dysrhythmia, unspecified    Colon polyp    Depression    Diverticulitis of colon    Dizziness    DUE TO MEDICINES   Dysrhythmia    GERD (gastroesophageal reflux disease)    HOH (hard of hearing)    Hyperlipidemia    Hypertension    pt denies. placed on meds after CABG   IBS (irritable bowel syndrome)    Neuropathy    Neuropathy 07/01/2013   Peripheral  vascular disease ([place])    s/p CEA    Reflux esophagitis    Rheumatic fever    possible at age 18   Sliding hiatal hernia    Tobacco abuse    Tobacco abuse    Past Surgical History:  Procedure Laterality Date   ABDOMINAL HYSTERECTOMY     ABDOMINAL SURGERY  1976   for pain secondary to scar tissue, s/p apply   APPENDECTOMY  1974   BACK SURGERY     CAROTID ENDARTERECTOMY     CATARACT EXTRACTION W/PHACO Right 10/18/2016   Procedure: CATARACT EXTRACTION PHACO AND INTRAOCULAR LENS PLACEMENT ([place]);  Surgeon: Estill Cotta, MD;  Location: [facility];  Service: Ophthalmology;  Laterality: Right;  Lot # [id number] H [place]: 01:04.0 AP%: 24.4 CDE:  30.44   CATARACT EXTRACTION W/PHACO Left 03/14/2017   Procedure: CATARACT EXTRACTION PHACO AND INTRAOCULAR LENS PLACEMENT ([place])  Left;  Surgeon: Leandrew Koyanagi, MD;  Location: [facility];  Service: Ophthalmology;  Laterality: Left;   CEA     Carotid stenosis found during workup for dysphagia,  [place]  2009   Dr. Hoover Brunette for cervical cord stenosis, C5-6 diskectomy   CHOLECYSTECTOMY  2002   Dr. Pat Patrick   CLOSED MANIPULATION SHOULDER     left shoulder post physical therapy, Right shoulder redo (toggle bolts)   COLONOSCOPY  09/01/14   coronary angiography  01/2011   one occluded artery with collateralization, other arteries patent   CORONARY ARTERY BYPASS GRAFT  2006   s/p triple bypass surgery, [facility]   heart catherization  2016   HERNIA REPAIR  may 2011   Dr. Pat Patrick   hernia repair     LUMBAR [place]   L5, unssuccessful, fusion in 1983 successful (2 lumbar)   PARTIAL HYSTERECTOMY     ROTATOR CUFF REPAIR  2002   right shoulder Dr. Nadean Corwin, [facility] in [place]     Family History  Problem Relation Age of Onset   Diabetes Mother    Heart disease Mother    Other Father        suicide   Cancer Sister        cervical cancer   Diabetes Sister    Diabetes Sister    Cancer Sister        breast   Breast cancer Sister    Heart disease Brother        coronary artery disease   Other Brother        suicide   Other Other        colon resection due to inflammation -nephew   Bladder Cancer Neg Hx    Kidney cancer Neg Hx    Social History   Socioeconomic History   Marital status: Widowed    Spouse name: Not on file   Number of children: Not on file   Years of education: Not on file   Highest education level: Not on file  Occupational History   Not on file  Tobacco Use   Smoking status: Former    Packs/day: 0.50    Years: 30.00    Pack years: 15.00    Types: Cigarettes    Quit date: 04/01/2012     Years since quitting: 9.0   Smokeless tobacco: Never   Tobacco comments:    quit for 2 years after sinus infection and 6 months after heart surgery  Vaping Use   Vaping Use: Never used  Substance and Sexual Activity   Alcohol use: No   Drug use: No   Sexual activity: Not Currently  Other Topics Concern   Not on file  Social History Narrative   Widowed   Social Determinants of Health   Financial Resource Strain: Low Risk    Difficulty of Paying Living Expenses: Not hard at all  Food Insecurity: No Food Insecurity   Worried About Charity fundraiser in the Last Year: Never true   Arboriculturist in the Last Year: Never true  Transportation Needs: No Transportation Needs   Lack of Transportation (Medical): No   Lack of Transportation (Non-Medical): No  Physical Activity: Not on file  Stress: No Stress Concern Present   Feeling of Stress : Only a little  Social Connections: Unknown   Frequency of Communication with Friends and Family: More than three times a week   Frequency of Social Gatherings with Friends and Family: More than three times a week   Attends Religious Services: Not on Electrical engineer or Organizations: Not on file   Attends Archivist Meetings: Not on file   Marital Status: Not on file    Tobacco Counseling Counseling given: Not Answered Tobacco comments: quit for 2 years after sinus infection and 6 months after heart surgery   Clinical Intake:  Pre-visit preparation completed: Yes        Diabetes: No  How often do you need to have someone help you when you read instructions, pamphlets, or other written materials from your doctor or pharmacy?: 1 - Never    Interpreter Needed?: No      Activities of Daily Living In your present state of health, do you have any difficulty performing the following activities: 04/27/2021  Hearing? N  Vision? N  Difficulty concentrating or making decisions? N  Walking or climbing  stairs? N  Dressing or bathing? N  Doing errands, shopping? N  Preparing Food and eating ? N  Using the Toilet? N  In the past six months, have you accidently leaked urine? N  Do you have problems with loss of bowel control? N  Managing your Medications? N  Managing your Finances? N  Housekeeping or managing your Housekeeping? N  Some recent data might be hidden    Patient Care Team: Crecencio Mc, MD as PCP - General (Internal Medicine) Crecencio Mc, MD (Internal Medicine) Bary Castilla [place] Gleason, MD (General Surgery)  Indicate any recent Medical Services you may have received from other than [facility] providers in the past year (date may be approximate).     Assessment:   This is a routine wellness examination for Sabrina Montoya.  Hearing/Vision screen Hearing Screening - Comments:: Patient has difficulty hearing conversational tones in a crowd. Does not wear hearing aids.  Vision Screening - Comments:: Followed by [facility] Wears corrective lenses Macular degeneration/dry Cataract extraction, bilateral Visits every 6 months.    Dietary issues and exercise activities discussed: Current Exercise Habits: Home exercise routine, Type of exercise: walking, Intensity: Mild Regular diet God water intake   Goals Addressed               This Visit's Progress     Patient Stated     Follow up with pcp (pt-stated)        As needed        Depression Screen [facility] 2/9 Scores 04/27/2021 03/17/2021 04/26/2020 09/12/2019 05/29/2019 04/24/2019 02/12/2018  PHQ - 2 Score 0  0 0 0 0 0 0    Fall Risk Fall Risk  04/27/2021 03/17/2021 05/13/2020 04/26/2020 12/31/2019  Falls in the past year? 0 0 0 0 0  Number falls in past yr: 0 0 - 0 -  Comment - - - - -  Injury with Fall? 0 0 - 0 -  Comment - - - - -  Risk for fall due to : - - - - -  Follow up Falls evaluation completed Falls evaluation completed Falls evaluation completed Falls evaluation completed Falls evaluation completed    [place]: Handrails in use when climbing stairs? Yes Home free of loose throw rugs in walkways, pet beds, electrical cords, etc? Yes  Adequate lighting in your home to reduce risk of falls? Yes   ASSISTIVE DEVICES UTILIZED TO PREVENT FALLS: Use of a cane, walker or w/c? No   TIMED UP AND GO:  Was the test performed? Yes .  Length of time to ambulate 10 feet: 10 sec.   Gait steady and fast without use of assistive device  Cognitive Function: Patient is alert and oriented x3.  Denies difficulty focusing, making decisions, memory loss.  MMSE/6CIT deferred. Normal by direct communication/observation.  MMSE - Mini Mental State Exam 02/02/2016  Orientation to time 5  Orientation to Place 5  Registration 3  Attention/ Calculation 5  Recall 3  Language- name 2 objects 2  Language- repeat 1  Language- follow 3 step command 3  Language- read & follow direction 1  Write a sentence 1  Copy design 1  Total score 30     6CIT Screen 04/26/2020 04/24/2019 02/12/2018 02/08/2017  What Year? 0 points 0 points 0 points 0 points  What month? - 0 points 0 points 0 points  What time? - 0 points 0 points 0 points  Count back from 20 - 0 points 0 points 0 points  Months in reverse 0 points 0 points 0 points 0 points  Repeat phrase 0 points - 0 points 0 points  Total Score - - 0 0    Immunizations Immunization History  Administered Date(s) Administered   Fluad Quad(high Dose 65+) 08/01/2019   Influenza Split 10/10/2011, 08/26/2012   Influenza, High Dose Seasonal PF 08/16/2016, 11/09/2017, 09/06/2018   Influenza,inj,Quad PF,6+ Mos 07/25/2013, 07/28/2014, 08/13/2015   Influenza-Unspecified 08/06/2012   PFIZER(Purple Top)SARS-COV-2 Vaccination 11/12/2019, 12/03/2019, 08/06/2020   Pneumococcal Conjugate-13 08/26/2014   Pneumococcal Polysaccharide-23 09/02/2010    TDAP status: Due, Education has been provided regarding the importance of this vaccine. Advised may receive  this vaccine at local pharmacy or Health Dept. Aware to provide a copy of the vaccination record if obtained from local pharmacy or Health Dept. Verbalized acceptance and understanding. Deferred.   Health Maintenance Colorectal cancer screening: Type of screening: Cologuard. Completed 10/08/16. Repeat every 3 years. Deferred per patient preference.   Mammogram status: Completed 03/2021. Repeat every year  Bone Density status: Completed 9/232020. Results reflect: Bone density results: OSTEOPENIA. Repeat every 2 years. Deferred per patient.   Vision Screening: Recommended annual ophthalmology exams for early detection of glaucoma and other disorders of the eye. Is the patient up to date with their annual eye exam?  Yes   Dental Screening: Recommended annual dental exams for proper oral hygiene  Community Resource Referral / Chronic Care Management: CRR required this visit?  No   CCM required this visit?  No      Plan:   Keep all routine maintenance appointments.  I have personally reviewed and noted the following in the patient's chart:   Medical and social history Use of alcohol, tobacco or illicit drugs  Current medications and supplements including opioid prescriptions.  Functional ability and status Nutritional status Physical activity Advanced directives List of other physicians Hospitalizations, surgeries, and ER visits in previous 12 months Vitals Screenings to include cognitive, depression, and falls Referrals and appointments  In addition, I have reviewed and discussed with patient certain preventive protocols, quality metrics, and best practice recommendations. A written personalized care plan for preventive services as well as general preventive health recommendations were provided to patient.     OBrien-Blaney, Zaeden Lastinger L, LPN   07/29/3006       I have reviewed the above information and agree with above.   Deborra Medina, MD

## 2021-04-27 NOTE — Patient Instructions (Addendum)
Sabrina Montoya , Thank you for taking time to come for your Medicare Wellness Visit. I appreciate your ongoing commitment to your health goals. Please review the following plan we discussed and let me know if I can assist you in the future.   These are the goals we discussed:  Goals       Patient Stated     Follow up with pcp (pt-stated)      As needed         This is a list of the screening recommended for you and due dates:  Health Maintenance  Topic Date Due   Complete foot exam   09/11/2020   COVID-19 Vaccine (4 - Booster for Pfizer series) 05/13/2021*   Zoster (Shingles) Vaccine (1 of 2) 07/28/2021*   Tetanus Vaccine  04/27/2022*   Flu Shot  06/06/2021   Hemoglobin A1C  10/27/2021   Eye exam for diabetics  01/18/2022   Mammogram  03/18/2022   Urine Protein Check  04/27/2022   DEXA scan (bone density measurement)  Completed   Hepatitis C Screening: USPSTF Recommendation to screen - Ages 19-79 yo.  Completed   Pneumonia vaccines  Completed   HPV Vaccine  Aged Out  *Topic was postponed. The date shown is not the original due date.     Advanced directives: not yet completed  Conditions/risks identified: none new  Follow up in one year for your annual wellness visit    Preventive Care 65 Years and Older, Female Preventive care refers to lifestyle choices and visits with your health care provider that can promote health and wellness. What does preventive care include? A yearly physical exam. This is also called an annual well check. Dental exams once or twice a year. Routine eye exams. Ask your health care provider how often you should have your eyes checked. Personal lifestyle choices, including: Daily care of your teeth and gums. Regular physical activity. Eating a healthy diet. Avoiding tobacco and drug use. Limiting alcohol use. Practicing safe sex. Taking low-dose aspirin every day. Taking vitamin and mineral supplements as recommended by your health care  provider. What happens during an annual well check? The services and screenings done by your health care provider during your annual well check will depend on your age, overall health, lifestyle risk factors, and family history of disease. Counseling  Your health care provider may ask you questions about your: Alcohol use. Tobacco use. Drug use. Emotional well-being. Home and relationship well-being. Sexual activity. Eating habits. History of falls. Memory and ability to understand (cognition). Work and work Statistician. Reproductive health. Screening  You may have the following tests or measurements: Height, weight, and BMI. Blood pressure. Lipid and cholesterol levels. These may be checked every 5 years, or more frequently if you are over 14 years old. Skin check. Lung cancer screening. You may have this screening every year starting at age 40 if you have a 30-pack-year history of smoking and currently smoke or have quit within the past 15 years. Fecal occult blood test (FOBT) of the stool. You may have this test every year starting at age 69. Flexible sigmoidoscopy or colonoscopy. You may have a sigmoidoscopy every 5 years or a colonoscopy every 10 years starting at age 61. Hepatitis C blood test. Hepatitis B blood test. Sexually transmitted disease (STD) testing. Diabetes screening. This is done by checking your blood sugar (glucose) after you have not eaten for a while (fasting). You may have this done every 1-3 years. Bone density scan. This  is done to screen for osteoporosis. You may have this done starting at age 87. Mammogram. This may be done every 1-2 years. Talk to your health care provider about how often you should have regular mammograms. Talk with your health care provider about your test results, treatment options, and if necessary, the need for more tests. Vaccines  Your health care provider may recommend certain vaccines, such as: Influenza vaccine. This is  recommended every year. Tetanus, diphtheria, and acellular pertussis (Tdap, Td) vaccine. You may need a Td booster every 10 years. Zoster vaccine. You may need this after age 71. Pneumococcal 13-valent conjugate (PCV13) vaccine. One dose is recommended after age 63. Pneumococcal polysaccharide (PPSV23) vaccine. One dose is recommended after age 26. Talk to your health care provider about which screenings and vaccines you need and how often you need them. This information is not intended to replace advice given to you by your health care provider. Make sure you discuss any questions you have with your health care provider. Document Released: 11/19/2015 Document Revised: 07/12/2016 Document Reviewed: 08/24/2015 Elsevier Interactive Patient Education  2017 [place] Prevention in the Home Falls can cause injuries. They can happen to people of all ages. There are many things you can do to make your home safe and to help prevent falls. What can I do on the outside of my home? Regularly fix the edges of walkways and driveways and fix any cracks. Remove anything that might make you trip as you walk through a door, such as a raised step or threshold. Trim any bushes or trees on the path to your home. Use bright outdoor lighting. Clear any walking paths of anything that might make someone trip, such as rocks or tools. Regularly check to see if handrails are loose or broken. Make sure that both sides of any steps have handrails. Any raised decks and porches should have guardrails on the edges. Have any leaves, snow, or ice cleared regularly. Use sand or salt on walking paths during winter. Clean up any spills in your garage right away. This includes oil or grease spills. What can I do in the bathroom? Use night lights. Install grab bars by the toilet and in the tub and shower. Do not use towel bars as grab bars. Use non-skid mats or decals in the tub or shower. If you need to sit down in  the shower, use a plastic, non-slip stool. Keep the floor dry. Clean up any water that spills on the floor as soon as it happens. Remove soap buildup in the tub or shower regularly. Attach bath mats securely with double-sided non-slip rug tape. Do not have throw rugs and other things on the floor that can make you trip. What can I do in the bedroom? Use night lights. Make sure that you have a light by your bed that is easy to reach. Do not use any sheets or blankets that are too big for your bed. They should not hang down onto the floor. Have a firm chair that has side arms. You can use this for support while you get dressed. Do not have throw rugs and other things on the floor that can make you trip. What can I do in the kitchen? Clean up any spills right away. Avoid walking on wet floors. Keep items that you use a lot in easy-to-reach places. If you need to reach something above you, use a strong step stool that has a grab bar. Keep electrical cords out  of the way. Do not use floor polish or wax that makes floors slippery. If you must use wax, use non-skid floor wax. Do not have throw rugs and other things on the floor that can make you trip. What can I do with my stairs? Do not leave any items on the stairs. Make sure that there are handrails on both sides of the stairs and use them. Fix handrails that are broken or loose. Make sure that handrails are as long as the stairways. Check any carpeting to make sure that it is firmly attached to the stairs. Fix any carpet that is loose or worn. Avoid having throw rugs at the top or bottom of the stairs. If you do have throw rugs, attach them to the floor with carpet tape. Make sure that you have a light switch at the top of the stairs and the bottom of the stairs. If you do not have them, ask someone to add them for you. What else can I do to help prevent falls? Wear shoes that: Do not have high heels. Have rubber bottoms. Are comfortable  and fit you well. Are closed at the toe. Do not wear sandals. If you use a stepladder: Make sure that it is fully opened. Do not climb a closed stepladder. Make sure that both sides of the stepladder are locked into place. Ask someone to hold it for you, if possible. Clearly mark and make sure that you can see: Any grab bars or handrails. First and last steps. Where the edge of each step is. Use tools that help you move around (mobility aids) if they are needed. These include: Canes. Walkers. Scooters. Crutches. Turn on the lights when you go into a dark area. Replace any light bulbs as soon as they burn out. Set up your furniture so you have a clear path. Avoid moving your furniture around. If any of your floors are uneven, fix them. If there are any pets around you, be aware of where they are. Review your medicines with your doctor. Some medicines can make you feel dizzy. This can increase your chance of falling. Ask your doctor what other things that you can do to help prevent falls. This information is not intended to replace advice given to you by your health care provider. Make sure you discuss any questions you have with your health care provider. Document Released: 08/19/2009 Document Revised: 03/30/2016 Document Reviewed: 11/27/2014 Elsevier Interactive Patient Education  2017 [employer].

## 2021-05-04 ENCOUNTER — Ambulatory Visit: Payer: 59 | Admitting: Internal Medicine

## 2021-05-05 ENCOUNTER — Encounter: Payer: Self-pay | Admitting: Internal Medicine

## 2021-05-05 ENCOUNTER — Other Ambulatory Visit: Payer: Self-pay

## 2021-05-05 ENCOUNTER — Telehealth: Payer: Self-pay

## 2021-05-05 ENCOUNTER — Ambulatory Visit (INDEPENDENT_AMBULATORY_CARE_PROVIDER_SITE_OTHER): Payer: Medicare Other | Admitting: Internal Medicine

## 2021-05-05 VITALS — BP 142/82 | HR 82 | Temp 97.0°F | Ht 64.0 in | Wt 150.0 lb

## 2021-05-05 DIAGNOSIS — I6523 Occlusion and stenosis of bilateral carotid arteries: Secondary | ICD-10-CM | POA: Diagnosis not present

## 2021-05-05 DIAGNOSIS — I714 Abdominal aortic aneurysm, without rupture, unspecified: Secondary | ICD-10-CM

## 2021-05-05 DIAGNOSIS — E1140 Type 2 diabetes mellitus with diabetic neuropathy, unspecified: Secondary | ICD-10-CM | POA: Diagnosis not present

## 2021-05-05 DIAGNOSIS — M62838 Other muscle spasm: Secondary | ICD-10-CM | POA: Diagnosis not present

## 2021-05-05 DIAGNOSIS — M151 Heberden's nodes (with arthropathy): Secondary | ICD-10-CM | POA: Diagnosis not present

## 2021-05-05 DIAGNOSIS — M65831 Other synovitis and tenosynovitis, right forearm: Secondary | ICD-10-CM | POA: Diagnosis not present

## 2021-05-05 NOTE — Patient Instructions (Signed)
Treat yourself to a massage !  Your trapezius muscle needs it  Your hand issues are all due to overuse.  Wear and tear requires rest,  ice,  and modification of activities  The numbness in your toes on the left may be due to your prior back surgery.   I am letting you know that I am referring to our clinical pharmacist ,  Catie Darnelle Maffucci.  Catie helps me provide additional services to my patients who are on Medicare and dealing with  chronic diseases , like diabetes.  I do not expect this referral to cost you anything out of pocket, but I do think Catie will be able to help you get  Jardiance for your diabetes under control and maximize your drug benefits.  She will make contact with  You by phone in the next wee

## 2021-05-05 NOTE — Chronic Care Management (AMB) (Signed)
  Chronic Care Management   Note  05/05/2021 Name: Sabrina Montoya MRN: [id number] DOB: November 27, 1940  Sabrina Montoya is a 80 y.o. year old female who is a primary care patient of Derrel Nip, Aris Everts, MD. I reached out to Sabrina Montoya by phone today in response to a referral sent by Sabrina Montoya's PCP, Crecencio Mc, MD      Sabrina Montoya was given information about Chronic Care Management services today including:  CCM service includes personalized support from designated clinical staff supervised by her physician, including individualized plan of care and coordination with other care providers 24/7 contact phone numbers for assistance for urgent and routine care needs. Service will only be billed when office clinical staff spend 20 minutes or more in a month to coordinate care. Only one practitioner may furnish and bill the service in a calendar month. The patient may stop CCM services at any time (effective at the end of the month) by phone call to the office staff. The patient will be responsible for cost sharing (co-pay) of up to 20% of the service fee (after annual deductible is met).  Patient agreed to services and verbal consent obtained.   Follow up plan: Telephone appointment with care management team member scheduled for:05/12/2021  Sabrina Montoya, [name], Sabrina Montoya, [name] [id number] Direct Dial: [phone] [email or web address] Website: [email or web address]

## 2021-05-05 NOTE — Progress Notes (Signed)
Subjective:  Patient ID: Sabrina Montoya, female    DOB: 19-Aug-1941  Age: 80 y.o. MRN: [id number]  CC: The primary encounter diagnosis was Controlled type 2 diabetes with neuropathy ([place]). 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  HPI LENEA BYWATER presents for follow up on type 2 Diabetes and other issues .  Last seen one year ago  This visit occurred during the SARS-CoV-2 public health emergency.  Safety protocols were in place, including screening questions prior to the visit, additional usage of staff PPE, and extensive cleaning of exam room while observing appropriate contact time as indicated for disinfecting solutions.    DM:  she  feels generally well, is working vigorously in her yard daily and  checking blood sugars once daily at variable times.  BS have been under 130 fasting and < 158 post prandially.  Denies any recent hypoglyemic events.  Taking  no medications for diabetes,  , but taking Crestor and ASA..  . Following a carbohydrate modified diet  but has been indulging  in homemade ice cream  every night for the past month .  Has intermittent numbness of the toes on both feet,  and notices that her feet  become purple and legs go numb if she sits on the toilet seat for more than 10 minutes.  Moving bowels daily,  stools alternate between small caliber and large,  but always formed.  No blood.     Recurrent leg cramps in left leg.  Taking turmeric helps.  Cramps occur  at the end of the day,  after working in yard.   Saw  Dr Audery Amel for  for right  wrist pain after hand surgeon at [facility] gave her a painful injection in the wrist that did not help. Tenosynovitis diagnosed. She states that DR Jefm Bryant suggested use of NSAIDS (she has not) . Using EMU  cream whic has helped  Left trapezius muscle pain, neck pain with turning head,a  occasionally hurts behind the ear on the right. Does not radiate to arms.  No loss of strength  Painful disfiguring cyst forming on the lateral side of her right index finger   Anxiety:  aggravated by having to manage household repairs  and feeling taken advantage of by repair men. She has demonstrated  a healthy indignant attitude and has been using her confrontational skills.   HTN:  Patient is taking only Imdur 30 mg daily  for BP and notes no adverse effects.  Home BP readings have been done about once per week and are  generally < 130/80 .  She is avoiding added salt in her diet and walking regularly about 3 times per week for exercise  .  BP is elevated today and attributed to her having a flat tire which was discovered as she was leaving home for her appt.   Outpatient Medications Prior to Visit  Medication Sig Dispense Refill   aspirin 81 MG tablet Take 81 mg by mouth daily.     cholecalciferol (VITAMIN D) 1000 UNITS tablet Take 1,000 Units by mouth daily.     conjugated estrogens (PREMARIN) vaginal cream Place vaginally.     Cyanocobalamin (VITAMIN B12 PO) Take 1 Dose by mouth daily. GUMMY CHEWS; 3,000 mg     isosorbide mononitrate (IMDUR) 30 MG 24 hr  tablet Take 30 mg by mouth daily.     Multiple Vitamins-Minerals (PRESERVISION AREDS 2 PO) Take by mouth 2 (two) times daily.     omeprazole (PRILOSEC) 20 MG capsule TAKE 1 CAPSULE(20 MG) BY MOUTH DAILY 90 capsule 1   rosuvastatin (CRESTOR) 40 MG tablet Take 1 tablet (40 mg total) by mouth daily. 90 tablet 0   tiZANidine (ZANAFLEX) 4 MG tablet Take 1 tablet (4 mg total) by mouth every 8 (eight) hours as needed for muscle spasms (will cause drowsiness.). 30 tablet 0   triamcinolone cream (KENALOG) 0.1 % Apply topically.     Turmeric (QC TUMERIC COMPLEX PO) Take 1 mg by mouth daily.     Multiple Vitamins-Minerals (CENTRUM SILVER 50+WOMEN PO) Take 1 Dose by  mouth daily. (Patient not taking: Reported on 05/05/2021)     No facility-administered medications prior to visit.    Review of Systems;  Patient denies headache, fevers, malaise, unintentional weight loss, skin rash, eye pain, sinus congestion and sinus pain, sore throat, dysphagia,  hemoptysis , cough, dyspnea, wheezing, chest pain, palpitations, orthopnea, edema, abdominal pain, nausea, melena, diarrhea, constipation, flank pain, dysuria, hematuria, urinary  Frequency, nocturia, numbness, tingling, seizures,  Focal weakness, Loss of consciousness,  Tremor, insomnia, depression, anxiety, and suicidal ideation.      Objective:  BP (!) 142/82 (BP Location: Left Arm, Patient Position: Sitting, Cuff Size: Normal)   Pulse 82   Temp (!) 97 F (36.1 C) (Temporal)   Ht 5\' 4"  (1.626 m)   Wt 150 lb (68 kg)   SpO2 97%   BMI 25.75 kg/m   BP Readings from Last 3 Encounters:  05/05/21 (!) 142/82  04/27/21 (!) 162/77  03/17/21 126/74    Wt Readings from Last 3 Encounters:  05/05/21 150 lb (68 kg)  04/27/21 150 lb 6.4 oz (68.2 kg)  03/17/21 150 lb 9.6 oz (68.3 kg)    General appearance: alert, cooperative and appears stated age Ears: normal TM's and external ear canals both ears Throat: lips, mucosa, and tongue normal; teeth and gums normal Neck: no adenopathy, no carotid bruit, supple, symmetrical, trachea midline and thyroid not enlarged, symmetric, no tenderness/mass/nodules. ROM normal. MSK:  left trapezius muscle with point tenderness and muscle spasm.  No bruising   Back: symmetric, no curvature. ROM normal. No CVA tenderness. Lungs: clear to auscultation bilaterally Heart: regular rate and rhythm, S1, S2 normal, no murmur, click, rub or gallop Abdomen: soft, non-tender; bowel sounds normal; no masses,  no organomegaly Pulses: 2+ and symmetric Skin: Skin color, texture, turgor normal. No rashes or lesions Lymph nodes: Cervical, supraclavicular, and axillary nodes normal.  Lab  Results  Component Value Date   HGBA1C 7.3 (H) 04/27/2021   HGBA1C 6.6 (H) 05/13/2020   HGBA1C 6.7 (H) 05/20/2019    Lab Results  Component Value Date   CREATININE 1.02 04/27/2021   CREATININE 1.17 05/13/2020   CREATININE 1.03 05/20/2019    Lab Results  Component Value Date   WBC 4.2 04/18/2018   HGB 10.6 (L) 04/18/2018   HCT 31.0 (L) 04/18/2018   PLT 211.0 04/18/2018   GLUCOSE 123 (H) 04/27/2021   CHOL 141 04/27/2021   TRIG 169.0 (H) 04/27/2021   HDL 44.60 04/27/2021   LDLDIRECT 48.0 04/18/2018   LDLCALC 63 04/27/2021   ALT 16 04/27/2021   AST 36 04/27/2021   NA 142 04/27/2021   K 4.5 04/27/2021   CL 106 04/27/2021   CREATININE 1.02 04/27/2021   BUN 24 (H) 04/27/2021  CO2 28 04/27/2021   TSH 1.64 06/02/2019   HGBA1C 7.3 (H) 04/27/2021   MICROALBUR 2.5 (H) 04/27/2021    MM 3D SCREEN BREAST BILATERAL  Result Date: 03/18/2021 CLINICAL DATA:  Screening. EXAM: DIGITAL SCREENING BILATERAL MAMMOGRAM WITH TOMOSYNTHESIS AND CAD TECHNIQUE: Bilateral screening digital craniocaudal and mediolateral oblique mammograms were obtained. Bilateral screening digital breast tomosynthesis was performed. The images were evaluated with computer-aided detection. COMPARISON:  Previous exam(s). ACR Breast Density Category b: There are scattered areas of fibroglandular density. FINDINGS: There are no findings suspicious for malignancy. The images were evaluated with computer-aided detection. IMPRESSION: No mammographic evidence of malignancy. A result letter of this screening mammogram will be mailed directly to the patient. RECOMMENDATION: Screening mammogram in one year. (Code:SM-B-01Y) BI-RADS CATEGORY  1: Negative. Electronically Signed   By: Abelardo Diesel M.D.   On: 03/18/2021 13:24    Assessment & Plan:   Problem List Items Addressed This Visit       Unprioritized   AAA (abdominal aortic aneurysm) without rupture ([place])    Managed by Dr Lucky Cowboy with serial imaging .  Blood pressure is not  at goal of 120/70 or less .  Advised to repeat home readings and send to me .  Would recommend starting an  ARB given history of CAD and T2DM with new onset microalbuminuria   Lab Results  Component Value Date   LABMICR See below: 05/31/2018   LABMICR See below: 01/03/2018   MICROALBUR 2.5 (H) 04/27/2021   MICROALBUR 1.6 05/20/2019            Relevant Medications   telmisartan (MICARDIS) 20 MG tablet   Bilateral carotid artery stenosis    Unchanged by serial ultrasounds,  Continue asa and Crestor        Relevant Medications   telmisartan (MICARDIS) 20 MG tablet   Controlled type 2 diabetes with neuropathy ([facility]) - Primary    A1c has risen slightly and she nas new onset microalbuminuria. Recommending trial of SGLT 2inhibitor and ARB givne history fo CAD and microalbuminuria . Patient is up-to-date on eye exams . continue asa and crestor.  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  Referral to hand surgeon in progress        Relevant Orders   Ambulatory referral to Orthopedic Surgery   Trapezius muscle spasm    Recommend heating pad , stretching and Massage        A total of 40 minutes was spent with patient more than half of which was spent in counseling patient on there diabetes, hypertension, aortia aneurysm and various musculoskeletal issues; reviewing and explaining recent labs and imaging studies done, and coordination of care.   I have discontinued Norberta Keens. Guadalupe's Multiple  Vitamins-Minerals (CENTRUM SILVER 50+WOMEN PO). I am also having her start on telmisartan. Additionally, I am having her maintain her cholecalciferol, aspirin, isosorbide mononitrate, Multiple Vitamins-Minerals (PRESERVISION AREDS 2 PO), rosuvastatin, Cyanocobalamin (VITAMIN B12 PO), conjugated estrogens,  Turmeric (QC TUMERIC COMPLEX PO), triamcinolone cream, omeprazole, and tiZANidine.  Meds ordered this encounter  Medications   telmisartan (MICARDIS) 20 MG tablet    Sig: Take 1 tablet (20 mg total) by mouth daily. For hypertension and microalbuminuria    Dispense:  30 tablet    Refill:  2    Medications Discontinued During This Encounter  Medication Reason   Multiple Vitamins-Minerals (CENTRUM SILVER 50+WOMEN PO)     Follow-up: Return in about 6 months (around 11/04/2021).   Crecencio Mc, MD

## 2021-05-07 ENCOUNTER — Telehealth: Payer: Self-pay | Admitting: Internal Medicine

## 2021-05-07 DIAGNOSIS — E1140 Type 2 diabetes mellitus with diabetic neuropathy, unspecified: Secondary | ICD-10-CM

## 2021-05-07 DIAGNOSIS — M62838 Other muscle spasm: Secondary | ICD-10-CM | POA: Insufficient documentation

## 2021-05-07 MED ORDER — TELMISARTAN 20 MG PO TABS: 20.0000 mg | ORAL_TABLET | Freq: Every day | ORAL | 2 refills | Status: DC

## 2021-05-07 NOTE — Assessment & Plan Note (Signed)
Recommend heating pad , stretching and Massage

## 2021-05-07 NOTE — Assessment & Plan Note (Addendum)
A1c has risen slightly and she nas new onset microalbuminuria. Recommending trial of SGLT 2inhibitor and ARB givne history fo CAD and microalbuminuria . Patient is up-to-date on eye exams . continue asa and crestor.  Lab Results  Component Value Date   HGBA1C 7.3 (H) 04/27/2021   Lab Results  Component Value Date   MICROALBUR 2.5 (H) 04/27/2021   Lab Results  Component Value Date   CREATININE 1.02 04/27/2021

## 2021-05-07 NOTE — Assessment & Plan Note (Signed)
She is developing derangement of joint due to interdigital cyst.  Referral to hand surgeon in progress

## 2021-05-07 NOTE — Assessment & Plan Note (Signed)
Managed by Dr Lucky Cowboy with serial imaging .  Blood pressure is not at goal of 120/70 or less .  Advised to repeat home readings and send to me .  Would recommend starting an  ARB given history of CAD and T2DM with new onset microalbuminuria   Lab Results  Component Value Date   LABMICR See below: 05/31/2018   LABMICR See below: 01/03/2018   MICROALBUR 2.5 (H) 04/27/2021   MICROALBUR 1.6 05/20/2019

## 2021-05-07 NOTE — Assessment & Plan Note (Signed)
Unchanged by serial ultrasounds,  Continue asa and Crestor

## 2021-05-07 NOTE — Telephone Encounter (Signed)
She is being started on telmisartan .  She needs an RN  visit and  a lab visit in one week for BP check and BMET

## 2021-05-10 NOTE — Telephone Encounter (Signed)
Called and spoke to [place]. Fusako states that she has not seen the message from Dr. Derrel Nip. Message was read and patient verbalized understanding. She had no further questions on the message and is agreeable to starting the Telmisartan. Patient states that she will go get the medication today. Simran verbalized that she was uncertain on why her blood pressure was elevated and blames it on ongoing house work and a panic attack. She scheduled for future labs and a BP check on 05/19/21 at 2pm. She confirmed that she has an appointment with Catie on 05/12/21 and asks if they will be discussing the [place] medication and its prices at that time.

## 2021-05-10 NOTE — Telephone Encounter (Signed)
Noted. Will educate on CKD benefit of ARB as well.

## 2021-05-12 ENCOUNTER — Telehealth: Payer: Self-pay

## 2021-05-12 ENCOUNTER — Ambulatory Visit (INDEPENDENT_AMBULATORY_CARE_PROVIDER_SITE_OTHER): Payer: Medicare Other | Admitting: Pharmacist

## 2021-05-12 DIAGNOSIS — E1122 Type 2 diabetes mellitus with diabetic chronic kidney disease: Secondary | ICD-10-CM

## 2021-05-12 DIAGNOSIS — N1831 Chronic kidney disease, stage 3a: Secondary | ICD-10-CM

## 2021-05-12 DIAGNOSIS — I25810 Atherosclerosis of coronary artery bypass graft(s) without angina pectoris: Secondary | ICD-10-CM

## 2021-05-12 DIAGNOSIS — E7800 Pure hypercholesterolemia, unspecified: Secondary | ICD-10-CM

## 2021-05-12 MED ORDER — DAPAGLIFLOZIN PROPANEDIOL 5 MG PO TABS: 5.0000 mg | ORAL_TABLET | Freq: Every day | ORAL | 2 refills | Status: DC

## 2021-05-12 NOTE — Chronic Care Management (AMB) (Signed)
Chronic Care Management Pharmacy Note  05/12/2021 Name:  Sabrina Montoya MRN:  [id number] DOB:  1941/03/09  Subjective: Sabrina Montoya is an 80 y.o. year old female who is a primary patient of Derrel Nip, Aris Everts, MD.  The CCM team was consulted for assistance with disease management and care coordination needs.    Engaged with patient by telephone for initial visit in response to provider referral for pharmacy case management and/or care coordination services.   Consent to Services:  The patient was given the following information about Chronic Care Management services today, agreed to services, and gave verbal consent: 1. CCM service includes personalized support from designated clinical staff supervised by the primary care provider, including individualized plan of care and coordination with other care providers 2. 24/7 contact phone numbers for assistance for urgent and routine care needs. 3. Service will only be billed when office clinical staff spend 20 minutes or more in a month to coordinate care. 4. Only one practitioner may furnish and bill the service in a calendar month. 5.The patient may stop CCM services at any time (effective at the end of the month) by phone call to the office staff. 6. The patient will be responsible for cost sharing (co-pay) of up to 20% of the service fee (after annual deductible is met). Patient agreed to services and consent obtained.  Patient Care Team: Crecencio Mc, MD as PCP - General (Internal Medicine) Crecencio Mc, MD (Internal Medicine) Bary Castilla Forest Gleason, MD (General Surgery) De Hollingshead, RPH-CPP (Pharmacist)  Recent office visits: 6/30 - PCP f/u DM - starting telmisartan, discuss SGLT2; eGFR 52, LDL 63, A1c 7.3%   Recent consult visits: 5/18 - cardiology [place] (hx CABG 2006) - continue current regimen  Hospital visits: None in previous 6 months  Objective:  Lab Results  Component Value Date   CREATININE 1.02 04/27/2021   CREATININE  1.17 05/13/2020   CREATININE 1.03 05/20/2019    Lab Results  Component Value Date   HGBA1C 7.3 (H) 04/27/2021   Last diabetic Eye exam:  Lab Results  Component Value Date/Time   HMDIABEYEEXA No Retinopathy 04/29/2019 12:00 AM    Last diabetic Foot exam: No results found for: HMDIABFOOTEX      Component Value Date/Time   CHOL 141 04/27/2021 1038   TRIG 169.0 (H) 04/27/2021 1038   HDL 44.60 04/27/2021 1038   CHOLHDL 3 04/27/2021 1038   VLDL 33.8 04/27/2021 1038   LDLCALC 63 04/27/2021 1038   LDLDIRECT 48.0 04/18/2018 1008    Hepatic Function Latest Ref Rng & Units 04/27/2021 05/13/2020 05/20/2019  Total Protein 6.0 - 8.3 g/dL 7.2 7.1 6.9  Albumin 3.5 - 5.2 g/dL 4.8 4.8 4.8  AST 0 - 37 U/L 36 28 33  ALT 0 - 35 U/L _0 Alk Phosphatase 39 - 117 U/L 71 61 73  Total Bilirubin 0.2 - 1.2 mg/dL 0.6 0.5 0.4  Bilirubin, Direct 0.0 - 0.3 mg/dL - - -    Lab Results  Component Value Date/Time   TSH 1.64 06/02/2019 02:01 PM   TSH 1.79 03/07/2016 09:37 AM    CBC Latest Ref Rng & Units 04/18/2018 02/13/2018 11/13/2017  WBC 4.0 - 10.5 K/uL 4.2 4.8 3.5(L)  Hemoglobin 12.0 - 15.0 g/dL 10.6(L) 10.5(L) 11.6(L)  Hematocrit 36.0 - 46.0 % 31.0(L) 30.0(L) 35.0(L)  Platelets 150.0 - 400.0 K/uL 211.0 183.0 227.0    Lab Results  Component Value Date/Time   VD25OH 65.73 08/22/2016 08:46  AM   VD25OH 52.88 03/07/2016 09:37 AM    Clinical ASCVD: Yes  The 10-year ASCVD risk score Mikey Bussing DC Jr., et al., 2013) is: 54.1%   Values used to calculate the score:     Age: 16 years     Sex: Female     Is Non-Hispanic African American: No     Diabetic: Yes     Tobacco smoker: No     Systolic Blood Pressure: 762 mmHg     Is BP treated: Yes     HDL Cholesterol: 44.6 mg/dL     Total Cholesterol: 141 mg/dL     Social History   Tobacco Use  Smoking Status Former   Packs/day: 0.50   Years: 30.00   Pack years: 15.00   Types: Cigarettes   Quit date: 04/01/2012   Years since quitting: 9.1   Smokeless Tobacco Never  Tobacco Comments   quit for 2 years after sinus infection and 6 months after heart surgery   BP Readings from Last 3 Encounters:  05/05/21 (!) 142/82  04/27/21 (!) 162/77  03/17/21 126/74   Pulse Readings from Last 3 Encounters:  05/05/21 82  04/27/21 77  03/17/21 73   Wt Readings from Last 3 Encounters:  05/05/21 150 lb (68 kg)  04/27/21 150 lb 6.4 oz (68.2 kg)  03/17/21 150 lb 9.6 oz (68.3 kg)    Assessment: Review of patient past medical history, allergies, medications, health status, including review of consultants reports, laboratory and other test data, was performed as part of comprehensive evaluation and provision of chronic care management services.   SDOH:  (Social Determinants of Health) assessments and interventions performed:  SDOH Interventions    Flowsheet Row Most Recent Value  SDOH Interventions   Financial Strain Interventions Intervention Not Indicated       CCM Care Plan  Allergies  Allergen Reactions   Niacin And Related Hives   Sertraline Hcl Other (See Comments)    Hallucinations,     Sulfa Drugs Cross Reactors Nausea Only   Tetracyclines & Related Hives   Latex Rash    RAST testing was NEGATIVE  for LATEX   Simvastatin Rash    *Antihyperlipidemics*; elevated LFT's.     Medications Reviewed Today     Reviewed by De Hollingshead, RPH-CPP (Pharmacist) on 05/12/21 at [phone]  Med List Status: <None>   Medication Order Taking? Sig Documenting Provider Last Dose Status Informant  aspirin 81 MG tablet [id number] Yes Take 81 mg by mouth daily. [provider] Taking Active   cholecalciferol (VITAMIN D) 1000 UNITS tablet [id number] Yes Take 1,000 Units by mouth daily. [provider] Taking Active   conjugated estrogens (PREMARIN) vaginal cream [id number] Yes Place vaginally as needed. [provider] Taking Active   Cyanocobalamin (VITAMIN B12 PO) [id number] Yes Take 1 Dose by mouth daily. GUMMY  CHEWS; 3,000 mg [provider] Taking Active Self  isosorbide mononitrate (IMDUR) 30 MG 24 hr tablet [id number] Yes Take 30 mg by mouth daily. [provider] Taking Active   Multiple Vitamins-Minerals (PRESERVISION AREDS 2 PO) [id number] Yes Take by mouth 2 (two) times daily. [provider] Taking Active   omeprazole (PRILOSEC) 20 MG capsule [id number] Yes TAKE 1 CAPSULE(20 MG) BY MOUTH DAILY Crecencio Mc, MD Taking Active   rosuvastatin (CRESTOR) 40 MG tablet [id number] Yes Take 1 tablet (40 mg total) by mouth daily. Crecencio Mc, MD Taking Active   telmisartan (MICARDIS) 20 MG tablet [id number] Yes Take 1  tablet (20 mg total) by mouth daily. For hypertension and microalbuminuria Crecencio Mc, MD Taking Active   tiZANidine (ZANAFLEX) 4 MG tablet [id number] No Take 1 tablet (4 mg total) by mouth every 8 (eight) hours as needed for muscle spasms (will cause drowsiness.).  Patient not taking: Reported on 05/12/2021   Doreen Beam, FNP Not Taking Active   triamcinolone cream (KENALOG) 0.1 % [id number] Yes Apply 1 application topically daily as needed. [provider] Taking Active   Turmeric (QC TUMERIC COMPLEX PO) [id number] Yes Take 1 mg by mouth daily. [provider] Taking Active   Med List Note Leeanne Rio, [place] 11/13/17 4128): Please notify lab that pt needs a butterfly due to blood flow issues. Pt asked that I make a note in her chart so that lab tech dont get upset everytime she mentions it to them.            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 05/05/2021   Extensor tenosynovitis of right wrist 05/05/2021   Pain, dental 03/17/2021   Chronic thumb pain, right 05/15/2020   Syncope 12/31/2019   Arrhythmia, sinus node 05/29/2019   Fatigue 06/07/2018   Insomnia due to anxiety and fear 04/20/2018    Chronic bladder pain 12/23/2017   Irritable bowel syndrome (IBS) 12/23/2017   Atrophic vaginitis 02/11/2017   CKD (chronic kidney disease) stage 3, GFR 30-59 ml/min (HCC) 08/24/2016   Dysphagia, pharyngoesophageal phase 06/20/2016   Sciatica, left side 06/20/2016   Controlled type 2 diabetes with neuropathy ([place]) 05/16/2016   Preventative health care 02/06/2016   Cervicalgia 01/09/2015   Cystocele 08/29/2014   Anemia 05/02/2014   TIA (transient ischemic attack) 09/11/2013   Overweight (BMI 25.0-29.9) 03/16/2013   Bilateral carotid artery stenosis 03/03/2013   Ulnar neuropathy of left upper extremity 03/03/2013   Chronic reflux esophagitis 12/29/2012   Right hip pain 12/29/2012   History of tobacco abuse 07/03/2012   AAA (abdominal aortic aneurysm) without rupture (HCC)    CAD (coronary artery disease), autologous vein bypass graft    Peripheral vascular disease ([place])    Arthritis 02/29/2012   Hyperlipidemia    Osteopenia 08/29/2011   Vitamin D deficiency 08/29/2011    Immunization History  Administered Date(s) Administered   Fluad Quad(high Dose 65+) 08/01/2019   Influenza Split 10/10/2011, 08/26/2012   Influenza, High Dose Seasonal PF 08/16/2016, 11/09/2017, 09/06/2018   Influenza,inj,Quad PF,6+ Mos 07/25/2013, 07/28/2014, 08/13/2015   Influenza-Unspecified 08/06/2012   PFIZER(Purple Top)SARS-COV-2 Vaccination 11/12/2019, 12/03/2019, 08/06/2020   Pneumococcal Conjugate-13 08/26/2014   Pneumococcal Polysaccharide-23 09/02/2010    Conditions to be addressed/monitored: CAD, HTN, HLD, and DMII  Care Plan : Medication Management  Updates made by De Hollingshead, RPH-CPP since 05/12/2021 12:00 AM     Problem: Diabetes, CKD, HTN, CAD, PVD      Long-Range Goal: Disease Progression Prevention   Start Date: 05/12/2021  This Visit's Progress: On track  Priority: High  Note:   Current Barriers:  Suboptimal therapeutic regimen for diabetes, CKD , CAD  Pharmacist Clinical  Goal(s):  Over the next 90 days, patient will achieve control of diabetes as evidenced by A1c  through collaboration with PharmD and provider.   Interventions: 1:1 collaboration with Crecencio Mc, MD regarding development and update of comprehensive plan of care as evidenced by provider attestation and co-signature Inter-disciplinary care team collaboration (see longitudinal plan  of care) Comprehensive medication review performed; medication list updated in electronic medical record  SDOH: Lives alone. Reports she is still paying off her house  Health Maintenance: Due for foot exam.  Up to date on DM eye exam, vaccinations  Diabetes in the setting of CKD, CAD Uncontrolled; current treatment: none  Current glucose readings: not checking Counseled on SGLT2, including mechanism of action, side effects, and benefits. Discussed potential side effects of dehydration, genitourinary infections. Encouraged adequate hydration and genital hygiene. Advised on sick day rules (if a day with significantly reduced oral intake, serious vomiting, or diarrhea, hold SGLT2). Patient verbalized understanding. Educated on long term CV, CKD benefits as well as glycemic benefits. Start Farxiga 5 mg daily. Providing free 30 day supply coupon. Patient will defer starting until after upcoming BMP s/p initiation of telmisartan to avoid dual impact on Scr. If tolerated, will switch to 90 day supply Discussed availability of patient assistance. Patient is over income for SGLT2 patient assistance.   Hypertension (CAD, s/p CABG 2006, PVD) Uncontrolled but anticipated to be improved; current treatment: telmisartan 20 mg daily, isosorbide 30 mg daily; follows w/ [facility] cardiology  Current home readings: has an old cuff that she isn't sure if it works, plans on getting a new one from her daughter for her birthday Reports some occasional "fluttering" sensation over the past few weeks, but believes this is related to  anxiety. Discussed that if this continues to persist to contact her cardiologist.  Educated on long term benefit of RAAS agent on CKD progression. Reviewed reasoning for upcoming BMP. Will defer initiation of SGLT2 until after this is checked.  Recommended to continue current regimen at this time  Hyperlipidemia, secondary ASCVD prevention: Lipids controlled at goal <70; current treatment: rosuvastatin 40 mg daily;  Medications previously tried: niacin, simvastatin Antiplatelet regimen: aspirin 81 mg daily Recommended to continue current regimen at this time  GERD/hx esophagitis: Appropriately managed; current regimen: omeprazole 20 mg daily  PPI therapy appropriate given hx esophagitis. Recommended to continue current regimen at this time  Back Pain: Reports occasional complaints with this. Current regimen: tizanidine 2 mg PRN, though has not needed recently. Requests to keep on list in case she needs moving forward Recommended to continue current regimen at this time  ?Atropic Vaginitis: Controlled per patient report; premarin cream vaginally PRN Recommend to continue current regimen at this time  Supplements: Vitamin D, Vitamin B12, Preservision AREDS 2, turmeric   Patient Goals/Self-Care Activities Over the next 90 days, patient will:  - take medications as prescribed check blood pressure periodically, document, and provide at future appointments  Follow Up Plan: Telephone follow up appointment with care management team member scheduled for: ~ 4 weeks      Medication Assistance: None required.  Patient affirms current coverage meets needs.  Patient's preferred pharmacy is:  [id number] [address], [place] AT [facility] [address] [place] [address] Phone: [phone] Fax: [phone]   Follow Up:  Patient agrees to Care Plan and Follow-up.  Plan: Telephone follow up appointment with care management team member  scheduled for:  ~ 4 weeks  Catie Darnelle Maffucci, PharmD, [place], [place] Clinical Pharmacist [employer] at [employer] [phone]

## 2021-05-12 NOTE — Telephone Encounter (Signed)
PA for Faxiga 5 mg submitted to CoverMyMeds  KEY: [id number]

## 2021-05-12 NOTE — Patient Instructions (Signed)
Visit Information   PATIENT GOALS:   Goals Addressed               This Visit's Progress     Patient Stated     Medication Monitoring (pt-stated)        Patient Goals/Self-Care Activities Over the next 90 days, patient will:  - take medications as prescribed check blood pressure periodically, document, and provide at future appointments         Consent to CCM Services: Ms. Pulice was given information about Chronic Care Management services today including:  CCM service includes personalized support from designated clinical staff supervised by her physician, including individualized plan of care and coordination with other care providers 24/7 contact phone numbers for assistance for urgent and routine care needs. Service will only be billed when office clinical staff spend 20 minutes or more in a month to coordinate care. Only one practitioner may furnish and bill the service in a calendar month. The patient may stop CCM services at any time (effective at the end of the month) by phone call to the office staff. The patient will be responsible for cost sharing (co-pay) of up to 20% of the service fee (after annual deductible is met).  Patient agreed to services and verbal consent obtained.   Patient verbalizes understanding of instructions provided today and agrees to view in [place].    Plan: Telephone follow up appointment with care management team member scheduled for:  ~ 4 weeks  Catie Darnelle Maffucci, PharmD, [place], CPP Clinical Pharmacist [place] at [place]: Patient Care Plan: Medication Management     Problem Identified: Diabetes, CKD, HTN, CAD, PVD      Long-Range Goal: Disease Progression Prevention   Start Date: 05/12/2021  This Visit's Progress: On track  Priority: High  Note:   Current Barriers:  Suboptimal therapeutic regimen for diabetes, CKD , CAD  Pharmacist Clinical Goal(s):  Over the next 90 days,  patient will achieve control of diabetes as evidenced by A1c  through collaboration with PharmD and provider.   Interventions: 1:1 collaboration with Crecencio Mc, MD regarding development and update of comprehensive plan of care as evidenced by provider attestation and co-signature Inter-disciplinary care team collaboration (see longitudinal plan of care) Comprehensive medication review performed; medication list updated in electronic medical record  SDOH: Lives alone. Reports she is still paying off her house  Health Maintenance: Due for foot exam.  Up to date on DM eye exam, vaccinations  Diabetes in the setting of CKD, CAD Uncontrolled; current treatment: none  Current glucose readings: not checking Counseled on SGLT2, including mechanism of action, side effects, and benefits. Discussed potential side effects of dehydration, genitourinary infections. Encouraged adequate hydration and genital hygiene. Advised on sick day rules (if a day with significantly reduced oral intake, serious vomiting, or diarrhea, hold SGLT2). Patient verbalized understanding. Educated on long term CV, CKD benefits as well as glycemic benefits. Start Farxiga 5 mg daily. Providing free 30 day supply coupon. Patient will defer starting until after upcoming BMP s/p initiation of telmisartan to avoid dual impact on Scr. If tolerated, will switch to 90 day supply Discussed availability of patient assistance. Patient is over income for SGLT2 patient assistance.   Hypertension (CAD, s/p CABG 2006, PVD) Uncontrolled but anticipated to be improved; current treatment: telmisartan 20 mg daily, isosorbide 30 mg daily; follows w/ [facility] cardiology  Current home readings: has an old cuff that she isn't sure if it  works, plans on getting a new one from her daughter for her birthday Reports some occasional "fluttering" sensation over the past few weeks, but believes this is related to anxiety. Discussed that if this  continues to persist to contact her cardiologist.  Educated on long term benefit of RAAS agent on CKD progression. Reviewed reasoning for upcoming BMP. Will defer initiation of SGLT2 until after this is checked.  Recommended to continue current regimen at this time  Hyperlipidemia, secondary ASCVD prevention: Lipids controlled at goal <70; current treatment: rosuvastatin 40 mg daily;  Medications previously tried: niacin, simvastatin Antiplatelet regimen: aspirin 81 mg daily Recommended to continue current regimen at this time  GERD/hx esophagitis: Appropriately managed; current regimen: omeprazole 20 mg daily  PPI therapy appropriate given hx esophagitis. Recommended to continue current regimen at this time  Back Pain: Reports occasional complaints with this. Current regimen: tizanidine 2 mg PRN, though has not needed recently. Requests to keep on list in case she needs moving forward Recommended to continue current regimen at this time  ?Atropic Vaginitis: Controlled per patient report; premarin cream vaginally PRN Recommend to continue current regimen at this time  Supplements: Vitamin D, Vitamin B12, Preservision AREDS 2, turmeric   Patient Goals/Self-Care Activities Over the next 90 days, patient will:  - take medications as prescribed check blood pressure periodically, document, and provide at future appointments  Follow Up Plan: Telephone follow up appointment with care management team member scheduled for: ~ 4 weeks

## 2021-05-12 NOTE — Progress Notes (Signed)
I sent this question to [facility], I think its because CMA's cannot do assessments without it being supervised by a physician or RN.

## 2021-05-13 NOTE — Telephone Encounter (Signed)
PA for Farxiga approved 04/12/21-05/13/22.

## 2021-05-18 ENCOUNTER — Telehealth: Payer: Self-pay

## 2021-05-18 NOTE — Telephone Encounter (Signed)
Called to speak with Sabrina Montoya and inform her that her [place] has been Approved. Left a message to call back.

## 2021-05-19 ENCOUNTER — Other Ambulatory Visit: Payer: Self-pay

## 2021-05-19 ENCOUNTER — Ambulatory Visit (INDEPENDENT_AMBULATORY_CARE_PROVIDER_SITE_OTHER): Payer: Medicare Other

## 2021-05-19 VITALS — BP 137/64 | HR 79

## 2021-05-19 DIAGNOSIS — R6889 Other general symptoms and signs: Secondary | ICD-10-CM

## 2021-05-19 DIAGNOSIS — E1140 Type 2 diabetes mellitus with diabetic neuropathy, unspecified: Secondary | ICD-10-CM

## 2021-05-19 NOTE — Progress Notes (Signed)
Patient is here for a BP check due to bp being high at last visit, as per patient.  Currently patients BP is 137/64 and BPM is 79.  Patient has no complaints of headaches, blurry vision, chest pain, arm pain, light headedness, dizziness, and nor jaw pain. Please see previous note for order.

## 2021-05-20 ENCOUNTER — Other Ambulatory Visit: Payer: Self-pay | Admitting: Internal Medicine

## 2021-05-20 ENCOUNTER — Ambulatory Visit: Payer: Medicare Other | Admitting: Pharmacist

## 2021-05-20 ENCOUNTER — Telehealth: Payer: Self-pay | Admitting: Internal Medicine

## 2021-05-20 DIAGNOSIS — I25810 Atherosclerosis of coronary artery bypass graft(s) without angina pectoris: Secondary | ICD-10-CM

## 2021-05-20 DIAGNOSIS — E7800 Pure hypercholesterolemia, unspecified: Secondary | ICD-10-CM | POA: Diagnosis not present

## 2021-05-20 DIAGNOSIS — N1831 Chronic kidney disease, stage 3a: Secondary | ICD-10-CM

## 2021-05-20 DIAGNOSIS — N179 Acute kidney failure, unspecified: Secondary | ICD-10-CM

## 2021-05-20 DIAGNOSIS — E1122 Type 2 diabetes mellitus with diabetic chronic kidney disease: Secondary | ICD-10-CM

## 2021-05-20 LAB — BASIC METABOLIC PANEL
BUN: 35 mg/dL — ABNORMAL HIGH (ref 6–23)
CO2: 24 mEq/L (ref 19–32)
Calcium: 9.7 mg/dL (ref 8.4–10.5)
Chloride: 107 mEq/L (ref 96–112)
Creatinine, Ser: 1.37 mg/dL — ABNORMAL HIGH (ref 0.40–1.20)
GFR: 36.86 mL/min — ABNORMAL LOW (ref >60.00)
Glucose, Bld: 102 mg/dL — ABNORMAL HIGH (ref 70–99)
Potassium: 4.8 mEq/L (ref 3.5–5.1)
Sodium: 141 mEq/L (ref 135–145)

## 2021-05-20 NOTE — Assessment & Plan Note (Signed)
She has had a significant decline in GFR with trial of ARB.   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   Doppler US ordered

## 2021-05-20 NOTE — Patient Instructions (Signed)
Visit Information  PATIENT GOALS:  Goals Addressed               This Visit's Progress     Patient Stated     Medication Monitoring (pt-stated)        Patient Goals/Self-Care Activities Over the next 90 days, patient will:  - take medications as prescribed check blood pressure periodically, document, and provide at future appointments         Patient verbalizes understanding of instructions provided today and agrees to view in [place].    Plan: Telephone follow up appointment with care management team member scheduled for:  ~ 4 weeks  Catie Darnelle Maffucci, PharmD, [place], [place] Clinical Pharmacist [employer] at [employer] [phone]

## 2021-05-20 NOTE — Telephone Encounter (Signed)
See CCM visit dated today.

## 2021-05-20 NOTE — Chronic Care Management (AMB) (Signed)
Chronic Care Management Pharmacy Note  05/20/2021 Name:  Sabrina Montoya MRN:  [id number] DOB:  28-Jan-1941   Subjective: Sabrina Montoya is an 80 y.o. year old female who is a primary patient of Derrel Nip, Aris Everts, MD.  The CCM team was consulted for assistance with disease management and care coordination needs.    Engaged with patient by telephone for  medication management  in response to provider referral for pharmacy case management and/or care coordination services.   Consent to Services:  The patient was given information about Chronic Care Management services, agreed to services, and gave verbal consent prior to initiation of services.  Please see initial visit note for detailed documentation.   Patient Care Team: Crecencio Mc, MD as PCP - General (Internal Medicine) Crecencio Mc, MD (Internal Medicine) Bary Castilla Forest Gleason, MD (General Surgery) De Hollingshead, RPH-CPP (Pharmacist)   Objective:  Lab Results  Component Value Date   CREATININE 1.37 (H) 05/19/2021   CREATININE 1.02 04/27/2021   CREATININE 1.17 05/13/2020    Lab Results  Component Value Date   HGBA1C 7.3 (H) 04/27/2021   Last diabetic Eye exam:  Lab Results  Component Value Date/Time   HMDIABEYEEXA No Retinopathy 04/29/2019 12:00 AM    Last diabetic Foot exam: No results found for: HMDIABFOOTEX      Component Value Date/Time   CHOL 141 04/27/2021 1038   TRIG 169.0 (H) 04/27/2021 1038   HDL 44.60 04/27/2021 1038   CHOLHDL 3 04/27/2021 1038   VLDL 33.8 04/27/2021 1038   LDLCALC 63 04/27/2021 1038   LDLDIRECT 48.0 04/18/2018 1008    Hepatic Function Latest Ref Rng & Units 04/27/2021 05/13/2020 05/20/2019  Total Protein 6.0 - 8.3 g/dL 7.2 7.1 6.9  Albumin 3.5 - 5.2 g/dL 4.8 4.8 4.8  AST 0 - 37 U/L 36 28 33  ALT 0 - 35 U/L '16 14 17  ' Alk Phosphatase 39 - 117 U/L 71 61 73  Total Bilirubin 0.2 - 1.2 mg/dL 0.6 0.5 0.4  Bilirubin, Direct 0.0 - 0.3 mg/dL - - -    Lab Results  Component Value  Date/Time   TSH 1.64 06/02/2019 02:01 PM   TSH 1.79 03/07/2016 09:37 AM    CBC Latest Ref Rng & Units 04/18/2018 02/13/2018 11/13/2017  WBC 4.0 - 10.5 K/uL 4.2 4.8 3.5(L)  Hemoglobin 12.0 - 15.0 g/dL 10.6(L) 10.5(L) 11.6(L)  Hematocrit 36.0 - 46.0 % 31.0(L) 30.0(L) 35.0(L)  Platelets 150.0 - 400.0 K/uL 211.0 183.0 227.0    Lab Results  Component Value Date/Time   VD25OH 65.73 08/22/2016 08:46 AM   VD25OH 52.88 03/07/2016 09:37 AM    Clinical ASCVD: Yes  The 10-year ASCVD risk score Mikey Bussing DC Jr., et al., 2013) is: 51.5%   Values used to calculate the score:     Age: 9 years     Sex: Female     Is Non-Hispanic African American: No     Diabetic: Yes     Tobacco smoker: No     Systolic Blood Pressure: 720 mmHg     Is BP treated: Yes     HDL Cholesterol: 44.6 mg/dL     Total Cholesterol: 141 mg/dL      Social History   Tobacco Use  Smoking Status Former   Packs/day: 0.50   Years: 30.00   Pack years: 15.00   Types: Cigarettes   Quit date: 04/01/2012   Years since quitting: 9.1  Smokeless Tobacco Never  Tobacco Comments  quit for 2 years after sinus infection and 6 months after heart surgery   BP Readings from Last 3 Encounters:  05/19/21 137/64  05/05/21 (!) 142/82  04/27/21 (!) 162/77   Pulse Readings from Last 3 Encounters:  05/19/21 79  05/05/21 82  04/27/21 77   Wt Readings from Last 3 Encounters:  05/05/21 150 lb (68 kg)  04/27/21 150 lb 6.4 oz (68.2 kg)  03/17/21 150 lb 9.6 oz (68.3 kg)    Assessment: Review of patient past medical history, allergies, medications, health status, including review of consultants reports, laboratory and other test data, was performed as part of comprehensive evaluation and provision of chronic care management services.   SDOH:  (Social Determinants of Health) assessments and interventions performed:  SDOH Interventions    Flowsheet Row Most Recent Value  SDOH Interventions   Financial Strain Interventions Other  (Comment)       CCM Care Plan  Allergies  Allergen Reactions   Telmisartan Other (See Comments)    Acute renal failure   Niacin And Related Hives   Sertraline Hcl Other (See Comments)    Hallucinations,     Sulfa Drugs Cross Reactors Nausea Only   Tetracyclines & Related Hives   Latex Rash    RAST testing was NEGATIVE  for LATEX   Simvastatin Rash    *Antihyperlipidemics*; elevated LFT's.     Medications Reviewed Today     Reviewed by De Hollingshead, RPH-CPP (Pharmacist) on 05/12/21 at [phone]  Med List Status: <None>   Medication Order Taking? Sig Documenting Provider Last Dose Status Informant  aspirin 81 MG tablet [id number] Yes Take 81 mg by mouth daily. [provider] Taking Active   cholecalciferol (VITAMIN D) 1000 UNITS tablet [id number] Yes Take 1,000 Units by mouth daily. [provider] Taking Active   conjugated estrogens (PREMARIN) vaginal cream [id number] Yes Place vaginally as needed. [provider] Taking Active   Cyanocobalamin (VITAMIN B12 PO) [id number] Yes Take 1 Dose by mouth daily. GUMMY CHEWS; 3,000 mg [provider] Taking Active Self  isosorbide mononitrate (IMDUR) 30 MG 24 hr tablet [id number] Yes Take 30 mg by mouth daily. [provider] Taking Active   Multiple Vitamins-Minerals (PRESERVISION AREDS 2 PO) [id number] Yes Take by mouth 2 (two) times daily. [provider] Taking Active   omeprazole (PRILOSEC) 20 MG capsule [id number] Yes TAKE 1 CAPSULE(20 MG) BY MOUTH DAILY Crecencio Mc, MD Taking Active   rosuvastatin (CRESTOR) 40 MG tablet [id number] Yes Take 1 tablet (40 mg total) by mouth daily. Crecencio Mc, MD Taking Active   telmisartan (MICARDIS) 20 MG tablet [id number] Yes Take 1 tablet (20 mg total) by mouth daily. For hypertension and microalbuminuria Crecencio Mc, MD Taking Active   tiZANidine (ZANAFLEX) 4 MG tablet [id number] No Take 1 tablet (4 mg total) by mouth every 8 (eight) hours  as needed for muscle spasms (will cause drowsiness.).  Patient not taking: Reported on 05/12/2021   Doreen Beam, FNP Not Taking Active   triamcinolone cream (KENALOG) 0.1 % [id number] Yes Apply 1 application topically daily as needed. [provider] Taking Active   Turmeric (QC TUMERIC COMPLEX PO) [id number] Yes Take 1 mg by mouth daily. [provider] Taking Active   Med List Note Leeanne Rio, [place] 11/13/17 2336): Please notify lab that pt needs a butterfly due to blood flow issues. Pt asked that I make a note in her chart so that lab tech dont get upset  everytime she mentions it to them.            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 05/05/2021   Extensor tenosynovitis of right wrist 05/05/2021   Pain, dental 03/17/2021   Chronic thumb pain, right 05/15/2020   Syncope 12/31/2019   Arrhythmia, sinus node 05/29/2019   Fatigue 06/07/2018   Insomnia due to anxiety and fear 04/20/2018   Chronic bladder pain 12/23/2017   Irritable bowel syndrome (IBS) 12/23/2017   Atrophic vaginitis 02/11/2017   CKD (chronic kidney disease) stage 3, GFR 30-59 ml/min (HCC) 08/24/2016   Dysphagia, pharyngoesophageal phase 06/20/2016   Sciatica, left side 06/20/2016   Controlled type 2 diabetes with neuropathy ([place]) 05/16/2016   Preventative health care 02/06/2016   Cervicalgia 01/09/2015   Cystocele 08/29/2014   Acute renal failure ([place]) 05/02/2014   Anemia 05/02/2014   TIA (transient ischemic attack) 09/11/2013   Overweight (BMI 25.0-29.9) 03/16/2013   Bilateral carotid artery stenosis 03/03/2013   Ulnar neuropathy of left upper extremity 03/03/2013   Chronic reflux esophagitis 12/29/2012   Right hip pain 12/29/2012   History of tobacco abuse 07/03/2012   AAA (abdominal aortic aneurysm) without rupture (HCC)    CAD (coronary artery  disease), autologous vein bypass graft    Peripheral vascular disease ([place])    Arthritis 02/29/2012   Hyperlipidemia    Osteopenia 08/29/2011   Vitamin D deficiency 08/29/2011    Immunization History  Administered Date(s) Administered   Fluad Quad(high Dose 65+) 08/01/2019   Influenza Split 10/10/2011, 08/26/2012   Influenza, High Dose Seasonal PF 08/16/2016, 11/09/2017, 09/06/2018   Influenza,inj,Quad PF,6+ Mos 07/25/2013, 07/28/2014, 08/13/2015   Influenza-Unspecified 08/06/2012   PFIZER(Purple Top)SARS-COV-2 Vaccination 11/12/2019, 12/03/2019, 08/06/2020   Pneumococcal Conjugate-13 08/26/2014   Pneumococcal Polysaccharide-23 09/02/2010    Conditions to be addressed/monitored: HTN, HLD, and DMII  Care Plan : Medication Management  Updates made by De Hollingshead, RPH-CPP since 05/20/2021 12:00 AM     Problem: Diabetes, CKD, HTN, CAD, PVD      Long-Range Goal: Disease Progression Prevention   Start Date: 05/12/2021  This Visit's Progress: On track  Recent Progress: On track  Priority: High  Note:   Current Barriers:  Suboptimal therapeutic regimen for diabetes, CKD , CAD  Pharmacist Clinical Goal(s):  Over the next 90 days, patient will achieve control of diabetes as evidenced by A1c  through collaboration with PharmD and provider.   Interventions: 1:1 collaboration with Crecencio Mc, MD regarding development and update of comprehensive plan of care as evidenced by provider attestation and co-signature Inter-disciplinary care team collaboration (see longitudinal plan of care) Comprehensive medication review performed; medication list updated in electronic medical record  SDOH: Lives alone. Reports she is still paying off her house  Health Maintenance: Due for foot exam.  Up to date on DM eye exam, vaccinations  Diabetes in the setting of CKD, CAD Uncontrolled; current treatment: none  Current glucose readings: not checking HOLD Farxiga given acute Scr -  noted below. Copay was >$500 for 90 day supply. Patient reports a $400 deductible on her Part D plan. Will follow for results of repeat BMP and renal ultra sound and discuss next steps for therapy at our next call in August.   Hypertension (CAD, s/p CABG 2006, PVD) Uncontrolled but anticipated to be improved; current treatment: telmisartan 20 mg daily, isosorbide 30 mg daily; follows w/  [facility]  Current home readings: has an old cuff that she isn't sure if it works, plans on getting a new one from her daughter for her birthday 34% increase in Scr s/p initiation of telmisartan. STOP telmisartan. Patient notified. Collaborated w/ PCP. Repeat BMP ordered, lab visit scheduled next week. PCP placing order for renal ultrasound  Hyperlipidemia, secondary ASCVD prevention: Lipids controlled at goal <70; current treatment: rosuvastatin 40 mg daily;  Medications previously tried: niacin, simvastatin Antiplatelet regimen: aspirin 81 mg daily Previously recommended to continue current regimen at this time  GERD/hx esophagitis: Appropriately managed; current regimen: omeprazole 20 mg daily  PPI therapy appropriate given hx esophagitis. Previously recommended to continue current regimen at this time  Back Pain: Reports occasional complaints with this. Current regimen: tizanidine 2 mg PRN, though has not needed recently. Requests to keep on list in case she needs moving forward Previously recommended to continue current regimen at this time  ?Atropic Vaginitis: Controlled per patient report; premarin cream vaginally PRN Previously recommend to continue current regimen at this time  Supplements: Vitamin D, Vitamin B12, Preservision AREDS 2, turmeric   Patient Goals/Self-Care Activities Over the next 90 days, patient will:  - take medications as prescribed check blood pressure periodically, document, and provide at future appointments  Follow Up Plan: Telephone follow up appointment  with care management team member scheduled for: ~ 4 weeks      Medication Assistance: None required.  Patient affirms current coverage meets needs.  Patient's preferred pharmacy is:  [id number] [address], [place] AT [facility] [address] [place] [address] Phone: [phone] Fax: [phone]   Follow Up:  Patient agrees to Care Plan and Follow-up.  Plan: Telephone follow up appointment with care management team member scheduled for:  ~ 4 weeks  Catie Darnelle Maffucci, PharmD, [place], [place] Clinical Pharmacist [employer] at [employer] [phone]

## 2021-05-20 NOTE — Telephone Encounter (Signed)
Called and spoke with Sabrina Montoya. Sabrina Montoya states that she went to the pharmacy and the total cost of medication was $500 with insurance coverage. Pt asks if Catie was able to help with reduced costs and would like a call back.

## 2021-05-20 NOTE — Telephone Encounter (Signed)
Spoke with patient. Reviewed instruction to stop telmisartan given acute bump in Scr.   Patient notes she actually has not taken telmisartan since Monday. Reported that it made her feel tired and like she couldn't breath (not throat swelling, but chest heaviness per her report). Reports complete resolution since   Reviewed to also hold on starting Farxiga. She took at dose this morning, but will not take any more.   Scheduled non fasting lab appointment next week and ordered future BMP to recheck. Notified of Dr. Derrel Nip placing order for renal artery ultrasound. Patient amenable.

## 2021-05-23 ENCOUNTER — Telehealth: Payer: Self-pay | Admitting: Internal Medicine

## 2021-05-23 NOTE — Telephone Encounter (Signed)
Meredith please sch f/u with PCP in 4-8 weeks in person   Agree f/u with PCP before 11/2021 to address BP in person again for now for age at goal    Patient is here for a BP check due to bp being high at last visit, as per patient.  Currently patients BP is 137/64 and BPM is 79.  Patient has no complaints of headaches, blurry vision, chest pain, arm pain, light headedness, dizziness, and nor jaw pain. Please see previous note for order.

## 2021-05-23 NOTE — Telephone Encounter (Signed)
Spoke with pt and she stated that she did get the [employer] and she also spoke with Catie in regards to the medication changes. Pt stated that she has no further questions at this time.

## 2021-05-25 DIAGNOSIS — M654 Radial styloid tenosynovitis [de Quervain]: Secondary | ICD-10-CM | POA: Diagnosis not present

## 2021-05-25 DIAGNOSIS — M180 Bilateral primary osteoarthritis of first carpometacarpal joints: Secondary | ICD-10-CM | POA: Diagnosis not present

## 2021-05-25 DIAGNOSIS — M542 Cervicalgia: Secondary | ICD-10-CM | POA: Diagnosis not present

## 2021-05-25 DIAGNOSIS — M13841 Other specified arthritis, right hand: Secondary | ICD-10-CM | POA: Diagnosis not present

## 2021-05-26 ENCOUNTER — Other Ambulatory Visit: Payer: Self-pay

## 2021-05-26 ENCOUNTER — Other Ambulatory Visit (INDEPENDENT_AMBULATORY_CARE_PROVIDER_SITE_OTHER): Payer: Medicare Other

## 2021-05-26 DIAGNOSIS — N1831 Chronic kidney disease, stage 3a: Secondary | ICD-10-CM | POA: Diagnosis not present

## 2021-05-26 LAB — BASIC METABOLIC PANEL
BUN: 32 mg/dL — ABNORMAL HIGH (ref 6–23)
CO2: 24 mEq/L (ref 19–32)
Calcium: 9.4 mg/dL (ref 8.4–10.5)
Chloride: 109 mEq/L (ref 96–112)
Creatinine, Ser: 1.25 mg/dL — ABNORMAL HIGH (ref 0.40–1.20)
GFR: 41.14 mL/min — ABNORMAL LOW (ref >60.00)
Glucose, Bld: 127 mg/dL — ABNORMAL HIGH (ref 70–99)
Potassium: 4.5 mEq/L (ref 3.5–5.1)
Sodium: 142 mEq/L (ref 135–145)

## 2021-05-27 ENCOUNTER — Other Ambulatory Visit: Payer: Self-pay | Admitting: Internal Medicine

## 2021-05-27 DIAGNOSIS — N179 Acute kidney failure, unspecified: Secondary | ICD-10-CM

## 2021-05-30 ENCOUNTER — Telehealth: Payer: Self-pay | Admitting: Internal Medicine

## 2021-05-30 ENCOUNTER — Ambulatory Visit: Payer: Medicare Other | Admitting: Pharmacist

## 2021-05-30 DIAGNOSIS — I25810 Atherosclerosis of coronary artery bypass graft(s) without angina pectoris: Secondary | ICD-10-CM

## 2021-05-30 DIAGNOSIS — N1831 Chronic kidney disease, stage 3a: Secondary | ICD-10-CM

## 2021-05-30 DIAGNOSIS — E1122 Type 2 diabetes mellitus with diabetic chronic kidney disease: Secondary | ICD-10-CM

## 2021-05-30 DIAGNOSIS — E7800 Pure hypercholesterolemia, unspecified: Secondary | ICD-10-CM

## 2021-05-30 NOTE — Telephone Encounter (Signed)
Per phone note on 7/15 and Dr. Lupita Dawn message in that phone note, due to acute rise in Scr with telmisartan initiation, patient was to STOP telmisartan and HOLD on starting Farxiga until her kidney function returned to baseline.   Attempted to contact patient to review, LVM for her informing of this. Per most recent lab report, Dr. Derrel Nip advised that she continue to hold off (on Farxiga and telmisartan) until we see next BMP in about a month.   Will send MyChart message advising her of this as well so that she has it in writing.

## 2021-05-30 NOTE — Telephone Encounter (Signed)
Patient called in and stated that per her MyChart she was advised to stay off a medicine but it is not in the MyChart message.She also stated the pharm d told her not to take [place].Please advise.

## 2021-05-30 NOTE — Telephone Encounter (Signed)
I do not see in the note where she was advised to stop anything or where she was advised not to take farxiga?

## 2021-05-30 NOTE — Chronic Care Management (AMB) (Signed)
Chronic Care Management Pharmacy Note  05/30/2021 Name:  TARISSA KERIN MRN:  [id number] DOB:  October 27, 1941  Subjective: Sabrina Montoya is an 80 y.o. year old female who is a primary patient of Derrel Nip, Aris Everts, MD.  The CCM team was consulted for assistance with disease management and care coordination needs.    Care coordination  for  medication management  in response to provider referral for pharmacy case management and/or care coordination services.   Consent to Services:  The patient was given information about Chronic Care Management services, agreed to services, and gave verbal consent prior to initiation of services.  Please see initial visit note for detailed documentation.   Patient Care Team: Crecencio Mc, MD as PCP - General (Internal Medicine) Crecencio Mc, MD (Internal Medicine) Bary Castilla [place] Gleason, MD (General Surgery) De Hollingshead, RPH-CPP (Pharmacist)    Objective:  Lab Results  Component Value Date   CREATININE 1.25 (H) 05/26/2021   CREATININE 1.37 (H) 05/19/2021   CREATININE 1.02 04/27/2021    Lab Results  Component Value Date   HGBA1C 7.3 (H) 04/27/2021   Last diabetic Eye exam:  Lab Results  Component Value Date/Time   HMDIABEYEEXA No Retinopathy 04/29/2019 12:00 AM    Last diabetic Foot exam: No results found for: HMDIABFOOTEX      Component Value Date/Time   CHOL 141 04/27/2021 1038   TRIG 169.0 (H) 04/27/2021 1038   HDL 44.60 04/27/2021 1038   CHOLHDL 3 04/27/2021 1038   VLDL 33.8 04/27/2021 1038   LDLCALC 63 04/27/2021 1038   LDLDIRECT 48.0 04/18/2018 1008    Hepatic Function Latest Ref Rng & Units 04/27/2021 05/13/2020 05/20/2019  Total Protein 6.0 - 8.3 g/dL 7.2 7.1 6.9  Albumin 3.5 - 5.2 g/dL 4.8 4.8 4.8  AST 0 - 37 U/L 36 28 33  ALT 0 - 35 U/L '16 14 17  ' Alk Phosphatase 39 - 117 U/L 71 61 73  Total Bilirubin 0.2 - 1.2 mg/dL 0.6 0.5 0.4  Bilirubin, Direct 0.0 - 0.3 mg/dL - - -    Lab Results  Component Value Date/Time   TSH  1.64 06/02/2019 02:01 PM   TSH 1.79 03/07/2016 09:37 AM    CBC Latest Ref Rng & Units 04/18/2018 02/13/2018 11/13/2017  WBC 4.0 - 10.5 K/uL 4.2 4.8 3.5(L)  Hemoglobin 12.0 - 15.0 g/dL 10.6(L) 10.5(L) 11.6(L)  Hematocrit 36.0 - 46.0 % 31.0(L) 30.0(L) 35.0(L)  Platelets 150.0 - 400.0 K/uL 211.0 183.0 227.0    Lab Results  Component Value Date/Time   VD25OH 65.73 08/22/2016 08:46 AM   VD25OH 52.88 03/07/2016 09:37 AM    Clinical ASCVD: Yes  The 10-year ASCVD risk score Mikey Bussing DC Jr., et al., 2013) is: 55.7%   Values used to calculate the score:     Age: 89 years     Sex: Female     Is Non-Hispanic African American: No     Diabetic: Yes     Tobacco smoker: No     Systolic Blood Pressure: 808 mmHg     Is BP treated: Yes     HDL Cholesterol: 44.6 mg/dL     Total Cholesterol: 141 mg/dL     Social History   Tobacco Use  Smoking Status Former   Packs/day: 0.50   Years: 30.00   Pack years: 15.00   Types: Cigarettes   Quit date: 04/01/2012   Years since quitting: 9.1  Smokeless Tobacco Never  Tobacco Comments   quit  for 2 years after sinus infection and 6 months after heart surgery   BP Readings from Last 3 Encounters:  05/19/21 137/64  05/05/21 (!) 142/82  04/27/21 (!) 162/77   Pulse Readings from Last 3 Encounters:  05/19/21 79  05/05/21 82  04/27/21 77   Wt Readings from Last 3 Encounters:  05/05/21 150 lb (68 kg)  04/27/21 150 lb 6.4 oz (68.2 kg)  03/17/21 150 lb 9.6 oz (68.3 kg)    Assessment: Review of patient past medical history, allergies, medications, health status, including review of consultants reports, laboratory and other test data, was performed as part of comprehensive evaluation and provision of chronic care management services.   SDOH:  (Social Determinants of Health) assessments and interventions performed:    CCM Care Plan  Allergies  Allergen Reactions   Telmisartan Other (See Comments)    Acute renal failure   Niacin And Related Hives    Sertraline Hcl Other (See Comments)    Hallucinations,     Sulfa Drugs Cross Reactors Nausea Only   Tetracyclines & Related Hives   Latex Rash    RAST testing was NEGATIVE  for LATEX   Simvastatin Rash    *Antihyperlipidemics*; elevated LFT's.     Medications Reviewed Today     Reviewed by De Hollingshead, RPH-CPP (Pharmacist) on 05/12/21 at [id number]  Med List Status: <None>   Medication Order Taking? Sig Documenting Provider Last Dose Status Informant  aspirin 81 MG tablet [id number] Yes Take 81 mg by mouth daily. [provider] Taking Active   cholecalciferol (VITAMIN D) 1000 UNITS tablet [id number] Yes Take 1,000 Units by mouth daily. [provider] Taking Active   conjugated estrogens (PREMARIN) vaginal cream [id number] Yes Place vaginally as needed. [provider] Taking Active   Cyanocobalamin (VITAMIN B12 PO) [id number] Yes Take 1 Dose by mouth daily. GUMMY CHEWS; 3,000 mg [provider] Taking Active Self  isosorbide mononitrate (IMDUR) 30 MG 24 hr tablet [id number] Yes Take 30 mg by mouth daily. [provider] Taking Active   Multiple Vitamins-Minerals (PRESERVISION AREDS 2 PO) [id number] Yes Take by mouth 2 (two) times daily. [provider] Taking Active   omeprazole (PRILOSEC) 20 MG capsule [id number] Yes TAKE 1 CAPSULE(20 MG) BY MOUTH DAILY Crecencio Mc, MD Taking Active   rosuvastatin (CRESTOR) 40 MG tablet [id number] Yes Take 1 tablet (40 mg total) by mouth daily. Crecencio Mc, MD Taking Active   telmisartan (MICARDIS) 20 MG tablet [id number] Yes Take 1 tablet (20 mg total) by mouth daily. For hypertension and microalbuminuria Crecencio Mc, MD Taking Active   tiZANidine (ZANAFLEX) 4 MG tablet [id number] No Take 1 tablet (4 mg total) by mouth every 8 (eight) hours as needed for muscle spasms (will cause drowsiness.).  Patient not taking: Reported on 05/12/2021   Doreen Beam, FNP Not Taking Active   triamcinolone  cream (KENALOG) 0.1 % [id number] Yes Apply 1 application topically daily as needed. [provider] Taking Active   Turmeric (QC TUMERIC COMPLEX PO) [id number] Yes Take 1 mg by mouth daily. [provider] Taking Active   Med List Note Leeanne Rio, [place] 11/13/17 0093): Please notify lab that pt needs a butterfly due to blood flow issues. Pt asked that I make a note in her chart so that lab tech dont get upset everytime she mentions it to them.            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 05/05/2021   Extensor tenosynovitis of right wrist 05/05/2021   Pain, dental 03/17/2021   Chronic thumb pain, right 05/15/2020   Syncope 12/31/2019   Arrhythmia, sinus node 05/29/2019   Fatigue 06/07/2018   Insomnia due to anxiety and fear 04/20/2018   Chronic bladder pain 12/23/2017   Irritable bowel syndrome (IBS) 12/23/2017   Atrophic vaginitis 02/11/2017   CKD (chronic kidney disease) stage 3, GFR 30-59 ml/min ([place]) 08/24/2016   Dysphagia, pharyngoesophageal phase 06/20/2016   Sciatica, left side 06/20/2016   Controlled type 2 diabetes with neuropathy ([place]) 05/16/2016   Preventative health care 02/06/2016   Cervicalgia 01/09/2015   Cystocele 08/29/2014   Acute renal failure ([place]) 05/02/2014   Anemia 05/02/2014   TIA (transient ischemic attack) 09/11/2013   Overweight (BMI 25.0-29.9) 03/16/2013   Bilateral carotid artery stenosis 03/03/2013   Ulnar neuropathy of left upper extremity 03/03/2013   Chronic reflux esophagitis 12/29/2012   Right hip pain 12/29/2012   History of tobacco abuse 07/03/2012   AAA (abdominal aortic aneurysm) without rupture (HCC)    CAD (coronary artery disease), autologous vein bypass graft    Peripheral vascular disease ([place])    Arthritis 02/29/2012   Hyperlipidemia    Osteopenia 08/29/2011   Vitamin D  deficiency 08/29/2011    Immunization History  Administered Date(s) Administered   Fluad Quad(high Dose 65+) 08/01/2019   Influenza Split 10/10/2011, 08/26/2012   Influenza, High Dose Seasonal PF 08/16/2016, 11/09/2017, 09/06/2018   Influenza,inj,Quad PF,6+ Mos 07/25/2013, 07/28/2014, 08/13/2015   Influenza-Unspecified 08/06/2012   PFIZER(Purple Top)SARS-COV-2 Vaccination 11/12/2019, 12/03/2019, 08/06/2020   Pneumococcal Conjugate-13 08/26/2014   Pneumococcal Polysaccharide-23 09/02/2010    Conditions to be addressed/monitored: CAD, HTN, HLD, DMII, and CKD  Care Plan : Medication Management  Updates made by De Hollingshead, RPH-CPP since 05/30/2021 12:00 AM     Problem: Diabetes, CKD, HTN, CAD, PVD      Long-Range Goal: Disease Progression Prevention   Start Date: 05/12/2021  This Visit's Progress: On track  Recent Progress: On track  Priority: High  Note:   Current Barriers:  Suboptimal therapeutic regimen for diabetes, CKD , CAD  Pharmacist Clinical Goal(s):  Over the next 90 days, patient will achieve control of diabetes as evidenced by A1c  through collaboration with PharmD and provider.   Interventions: 1:1 collaboration with Crecencio Mc, MD regarding development and update of comprehensive plan of care as evidenced by provider attestation and co-signature Inter-disciplinary care team collaboration (see longitudinal plan of care) Comprehensive medication review performed; medication list updated in electronic medical record  SDOH: Lives alone. Reports she is still paying off her house.  Health Maintenance: Due for foot exam.  Up to date on DM eye exam, vaccinations  Diabetes in the setting of CKD, CAD Uncontrolled; current treatment: none  Current glucose readings: not checking Continue to [place] (stop [place]). Will remove from med list to avoid confusion. Will follow BMP at next PCP visit in ~ 1 month to determine if appropriate to add  [place].   Hypertension (CAD, s/p CABG 2006, PVD) Uncontrolled but anticipated to be improved; current treatment: isosorbide 30 mg daily; follows w/ [facility] cardiology  Acute rise in Scr (38%) with telmisartan initiation. Avoiding ARB. Continue isosorbide mononitrate 30 mg daily. Will send MyChart to clarify to stop telmisartan and not start [place].  Hyperlipidemia, secondary ASCVD prevention: Lipids controlled at goal <70;  current treatment: rosuvastatin 40 mg daily;  Medications previously tried: niacin, simvastatin Antiplatelet regimen: aspirin 81 mg daily Previously recommended to continue current regimen at this time  GERD/hx esophagitis: Appropriately managed; current regimen: omeprazole 20 mg daily  PPI therapy appropriate given hx esophagitis. Previously recommended to continue current regimen at this time  Back Pain: Reports occasional complaints with this. Current regimen: tizanidine 2 mg PRN, though has not needed recently. Requests to keep on list in case she needs moving forward Previously recommended to continue current regimen at this time  ?Atropic Vaginitis: Controlled per patient report; premarin cream vaginally PRN Previously recommend to continue current regimen at this time  Supplements: Vitamin D, Vitamin B12, Preservision AREDS 2, turmeric   Patient Goals/Self-Care Activities Over the next 90 days, patient will:  - take medications as prescribed check blood pressure periodically, document, and provide at future appointments  Follow Up Plan: Telephone follow up appointment with care management team member scheduled for: ~ 4 weeks      Medication Assistance: None required.  Patient affirms current coverage meets needs.  Patient's preferred pharmacy is:  [id number] Heal, [place] AT [facility] OF [address] [place] [address] Phone: [phone] Fax: [phone]    Follow Up:  Patient  agrees to Care Plan and Follow-up.  Plan: Telephone follow up appointment with care management team member scheduled for:  ~ 4 weeks  Catie Darnelle Maffucci, PharmD, [place], [place] Clinical Pharmacist [employer] at [employer] [phone]

## 2021-05-30 NOTE — Patient Instructions (Signed)
Visit Information  PATIENT GOALS:  Goals Addressed               This Visit's Progress     Patient Stated     Medication Monitoring (pt-stated)        Patient Goals/Self-Care Activities Over the next 90 days, patient will:  - take medications as prescribed check blood pressure periodically, document, and provide at future appointments        Patient verbalizes understanding of instructions provided today and agrees to view in [place].   Plan: Telephone follow up appointment with care management team member scheduled for:  ~ 4 weeks  Catie Darnelle Maffucci, PharmD, [place], [place] Clinical Pharmacist [employer] at [employer] [phone]

## 2021-06-05 ENCOUNTER — Inpatient Hospital Stay
Admission: EM | Admit: 2021-06-05 | Discharge: 2021-06-08 | DRG: 281 | Disposition: A | Discharge status: Home / Self Care | Payer: Medicare Other | Attending: Internal Medicine | Admitting: Internal Medicine

## 2021-06-05 ENCOUNTER — Emergency Department: Payer: Medicare Other

## 2021-06-05 ENCOUNTER — Other Ambulatory Visit: Payer: Self-pay

## 2021-06-05 DIAGNOSIS — E1165 Type 2 diabetes mellitus with hyperglycemia: Secondary | ICD-10-CM | POA: Diagnosis present

## 2021-06-05 DIAGNOSIS — K2100 Gastro-esophageal reflux disease with esophagitis, without bleeding: Secondary | ICD-10-CM | POA: Diagnosis present

## 2021-06-05 DIAGNOSIS — I25810 Atherosclerosis of coronary artery bypass graft(s) without angina pectoris: Secondary | ICD-10-CM | POA: Diagnosis not present

## 2021-06-05 DIAGNOSIS — G459 Transient cerebral ischemic attack, unspecified: Secondary | ICD-10-CM | POA: Diagnosis present

## 2021-06-05 DIAGNOSIS — E1121 Type 2 diabetes mellitus with diabetic nephropathy: Secondary | ICD-10-CM | POA: Diagnosis present

## 2021-06-05 DIAGNOSIS — I129 Hypertensive chronic kidney disease with stage 1 through stage 4 chronic kidney disease, or unspecified chronic kidney disease: Secondary | ICD-10-CM | POA: Diagnosis present

## 2021-06-05 DIAGNOSIS — R079 Chest pain, unspecified: Secondary | ICD-10-CM

## 2021-06-05 DIAGNOSIS — I739 Peripheral vascular disease, unspecified: Secondary | ICD-10-CM | POA: Diagnosis present

## 2021-06-05 DIAGNOSIS — E1122 Type 2 diabetes mellitus with diabetic chronic kidney disease: Secondary | ICD-10-CM | POA: Diagnosis present

## 2021-06-05 DIAGNOSIS — Z886 Allergy status to analgesic agent status: Secondary | ICD-10-CM

## 2021-06-05 DIAGNOSIS — R0602 Shortness of breath: Secondary | ICD-10-CM | POA: Diagnosis not present

## 2021-06-05 DIAGNOSIS — D631 Anemia in chronic kidney disease: Secondary | ICD-10-CM | POA: Diagnosis present

## 2021-06-05 DIAGNOSIS — I2582 Chronic total occlusion of coronary artery: Secondary | ICD-10-CM | POA: Diagnosis present

## 2021-06-05 DIAGNOSIS — I10 Essential (primary) hypertension: Secondary | ICD-10-CM | POA: Diagnosis not present

## 2021-06-05 DIAGNOSIS — E1140 Type 2 diabetes mellitus with diabetic neuropathy, unspecified: Secondary | ICD-10-CM | POA: Diagnosis not present

## 2021-06-05 DIAGNOSIS — Z888 Allergy status to other drugs, medicaments and biological substances status: Secondary | ICD-10-CM

## 2021-06-05 DIAGNOSIS — I2510 Atherosclerotic heart disease of native coronary artery without angina pectoris: Secondary | ICD-10-CM | POA: Diagnosis present

## 2021-06-05 DIAGNOSIS — E1151 Type 2 diabetes mellitus with diabetic peripheral angiopathy without gangrene: Secondary | ICD-10-CM | POA: Diagnosis present

## 2021-06-05 DIAGNOSIS — Z882 Allergy status to sulfonamides status: Secondary | ICD-10-CM

## 2021-06-05 DIAGNOSIS — D649 Anemia, unspecified: Secondary | ICD-10-CM | POA: Diagnosis present

## 2021-06-05 DIAGNOSIS — Z20822 Contact with and (suspected) exposure to covid-19: Secondary | ICD-10-CM | POA: Diagnosis not present

## 2021-06-05 DIAGNOSIS — Z79899 Other long term (current) drug therapy: Secondary | ICD-10-CM

## 2021-06-05 DIAGNOSIS — Z91040 Latex allergy status: Secondary | ICD-10-CM

## 2021-06-05 DIAGNOSIS — E785 Hyperlipidemia, unspecified: Secondary | ICD-10-CM | POA: Diagnosis present

## 2021-06-05 DIAGNOSIS — R0789 Other chest pain: Secondary | ICD-10-CM | POA: Diagnosis not present

## 2021-06-05 DIAGNOSIS — R61 Generalized hyperhidrosis: Secondary | ICD-10-CM | POA: Diagnosis not present

## 2021-06-05 DIAGNOSIS — N182 Chronic kidney disease, stage 2 (mild): Secondary | ICD-10-CM | POA: Diagnosis present

## 2021-06-05 DIAGNOSIS — I214 Non-ST elevation (NSTEMI) myocardial infarction: Principal | ICD-10-CM | POA: Diagnosis present

## 2021-06-05 DIAGNOSIS — Z87891 Personal history of nicotine dependence: Secondary | ICD-10-CM

## 2021-06-05 DIAGNOSIS — Z8673 Personal history of transient ischemic attack (TIA), and cerebral infarction without residual deficits: Secondary | ICD-10-CM

## 2021-06-05 LAB — CBC
HCT: 30.8 % — ABNORMAL LOW (ref 36.0–46.0)
Hemoglobin: 10.6 g/dL — ABNORMAL LOW (ref 12.0–15.0)
MCH: 29.9 pg (ref 26.0–34.0)
MCHC: 34.4 g/dL (ref 30.0–36.0)
MCV: 87 fL (ref 80.0–100.0)
Platelets: 173 10*3/uL (ref 150–400)
RBC: 3.54 MIL/uL — ABNORMAL LOW (ref 3.87–5.11)
RDW: 13.4 % (ref 11.5–15.5)
WBC: 5.5 10*3/uL (ref 4.0–10.5)
nRBC: 0 % (ref 0.0–0.2)

## 2021-06-05 LAB — COMPREHENSIVE METABOLIC PANEL
ALT: 21 U/L (ref 0–44)
AST: 43 U/L — ABNORMAL HIGH (ref 15–41)
Albumin: 4.4 g/dL (ref 3.5–5.0)
Alkaline Phosphatase: 61 U/L (ref 38–126)
Anion gap: 7 (ref 5–15)
BUN: 30 mg/dL — ABNORMAL HIGH (ref 8–23)
CO2: 23 mmol/L (ref 22–32)
Calcium: 9.4 mg/dL (ref 8.9–10.3)
Chloride: 108 mmol/L (ref 98–111)
Creatinine, Ser: 0.99 mg/dL (ref 0.44–1.00)
GFR, Estimated: 58 mL/min — ABNORMAL LOW (ref >60)
Glucose, Bld: 120 mg/dL — ABNORMAL HIGH (ref 70–99)
Potassium: 3.7 mmol/L (ref 3.5–5.1)
Sodium: 138 mmol/L (ref 135–145)
Total Bilirubin: 0.7 mg/dL (ref 0.3–1.2)
Total Protein: 7.1 g/dL (ref 6.5–8.1)

## 2021-06-05 LAB — TROPONIN I (HIGH SENSITIVITY): Troponin I (High Sensitivity): 15 ng/L (ref <18)

## 2021-06-05 NOTE — ED Triage Notes (Signed)
Pt was walking out of movie theater and had severe chest pain w shortness of breath. Has cardiac hx and was concerned. Pain is at 2/10 now.  Bp meds were changed 3 weeks ago d/t kidney issues.

## 2021-06-05 NOTE — ED Provider Notes (Signed)
[facility] Emergency Department Provider Note   ____________________________________________    I have reviewed the triage vital signs and the nursing notes.   HISTORY  Chief Complaint Chest Pain     HPI Sabrina Montoya is a 80 y.o. female with a history of CAD, CABG, hypertension, PVD presents with complaints of chest discomfort.  Patient reports she had just came out of a movie and was walking when she developed chest tightness and pressure and mild shortness of breath.  She reports this was moderate to severe in nature.  She notes that she has this occasionally but to a lesser severity when she overexerts herself.  She reports after receiving aspirin nitroglycerin her pain has improved significantly.  Denies calf pain or swelling.  No history of DVT or PE.  Past Medical History:  Diagnosis Date   Abdominal aortic aneurysm without mention of rupture    infrarenal, stable, folllowed by Leotis Pain   Acute posthemorrhagic anemia    Arthritis    B12 deficiency    CAD (coronary artery disease), autologous vein bypass graft    Cardiac dysrhythmia, unspecified    Colon polyp    Depression    Diverticulitis of colon    Dizziness    DUE TO MEDICINES   Dysrhythmia    GERD (gastroesophageal reflux disease)    HOH (hard of hearing)    Hyperlipidemia    Hypertension    pt denies. 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 05/05/2021   Extensor tenosynovitis of right wrist 05/05/2021   Pain, dental 03/17/2021   Chronic thumb pain, right 05/15/2020   Syncope  12/31/2019   Arrhythmia, sinus node 05/29/2019   Fatigue 06/07/2018   Insomnia due to anxiety and fear 04/20/2018   Chronic bladder pain 12/23/2017   Irritable bowel syndrome (IBS) 12/23/2017   Atrophic vaginitis 02/11/2017   CKD (chronic kidney disease) stage 3, GFR 30-59 ml/min ([place]) 08/24/2016   Dysphagia, pharyngoesophageal phase 06/20/2016   Sciatica, left side 06/20/2016   Controlled type 2 diabetes with neuropathy ([place]) 05/16/2016   Preventative health care 02/06/2016   Cervicalgia 01/09/2015   Cystocele 08/29/2014   Acute renal failure ([place]) 05/02/2014   Anemia 05/02/2014   TIA (transient ischemic attack) 09/11/2013   Overweight (BMI 25.0-29.9) 03/16/2013   Bilateral carotid artery stenosis 03/03/2013   Ulnar neuropathy of left upper extremity 03/03/2013   Chronic reflux esophagitis 12/29/2012   Right hip pain 12/29/2012   History of tobacco abuse 07/03/2012   AAA (abdominal aortic aneurysm) without rupture (HCC)    CAD (coronary artery disease), autologous vein bypass graft    Peripheral vascular disease ([place])    Arthritis 02/29/2012   Hyperlipidemia    Osteopenia 08/29/2011   Vitamin D deficiency 08/29/2011    Past Surgical History:  Procedure Laterality Date   ABDOMINAL HYSTERECTOMY     ABDOMINAL SURGERY  1976   for pain secondary to scar tissue, s/p apply   APPENDECTOMY  1974  BACK SURGERY     CAROTID ENDARTERECTOMY     CATARACT EXTRACTION W/PHACO Right 10/18/2016   Procedure: CATARACT EXTRACTION PHACO AND INTRAOCULAR LENS PLACEMENT ([place]);  Surgeon: Estill Cotta, MD;  Location: [facility] ORS;  Service: Ophthalmology;  Laterality: Right;  Lot # [id number] H [place]: 01:04.0 AP%: 24.4 CDE: 30.44   CATARACT EXTRACTION W/PHACO Left 03/14/2017   Procedure: CATARACT EXTRACTION PHACO AND INTRAOCULAR LENS PLACEMENT (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 Pat Patrick   CLOSED MANIPULATION SHOULDER     left shoulder post physical therapy, Right shoulder redo (toggle bolts)   COLONOSCOPY  09/01/14   coronary angiography  01/2011   one occluded artery with collateralization, other arteries patent   CORONARY ARTERY BYPASS GRAFT  2006   s/p triple bypass surgery, [facility]   heart catherization  2016   HERNIA REPAIR  may 2011   Dr. Pat Patrick   hernia repair     LUMBAR [place]   L5, unssuccessful, fusion in 1983 successful (2 lumbar)   PARTIAL HYSTERECTOMY     ROTATOR CUFF REPAIR  2002   right shoulder Dr. Nadean Corwin, [place] in [place]      Prior to Admission medications   Medication Sig Start Date End Date Taking? Authorizing Provider  aspirin 81 MG tablet Take 81 mg by mouth daily.    [provider]  cholecalciferol (VITAMIN D) 1000 UNITS tablet Take 1,000 Units by mouth daily.    [provider]  conjugated estrogens (PREMARIN) vaginal cream Place vaginally as needed. 05/31/18   [provider]  Cyanocobalamin (VITAMIN B12 PO) Take 1 Dose by mouth daily. GUMMY CHEWS; 3,000 mg    [provider]  isosorbide mononitrate (IMDUR) 30 MG 24 hr tablet Take 30 mg by mouth daily.    [provider]  Multiple Vitamins-Minerals (PRESERVISION AREDS 2 PO) Take by mouth 2 (two) times daily.    [provider]  omeprazole (PRILOSEC) 20 MG capsule TAKE 1 CAPSULE(20 MG) BY MOUTH DAILY 03/02/21   Crecencio Mc, MD  rosuvastatin (CRESTOR) 40 MG tablet Take 1 tablet (40 mg total) by mouth daily. 06/05/18   Crecencio Mc, MD  tiZANidine (ZANAFLEX) 4 MG tablet Take 1 tablet (4 mg total) by mouth every 8 (eight) hours as needed for muscle spasms (will cause drowsiness.). Patient not taking: Reported on 05/12/2021  03/17/21   Flinchum, Kelby Aline, FNP  triamcinolone cream (KENALOG) 0.1 % Apply 1 application topically daily as needed. 08/10/20   [provider]  Turmeric (QC TUMERIC COMPLEX PO) Take 1 mg by mouth daily.    [provider]     Allergies Telmisartan, Niacin and related, Sertraline hcl, Sulfa drugs cross reactors, Tetracyclines & related, Latex, and Simvastatin  Family History  Problem Relation Age of Onset   Diabetes Mother    Heart disease Mother    Other Father        suicide   Cancer Sister        cervical cancer   Diabetes Sister    Diabetes Sister    Cancer Sister        breast   Breast  cancer Sister    Heart disease Brother        coronary artery disease   Other Brother        suicide   Other Other        colon resection due to inflammation -nephew   Bladder Cancer Neg Hx    Kidney cancer Neg Hx     Social History Social History   Tobacco Use   Smoking status: Former    Packs/day: 0.50    Years: 30.00    Pack years: 15.00    Types: Cigarettes    Quit date: 04/01/2012    Years since quitting: 9.1   Smokeless tobacco: Never   Tobacco comments:    quit for 2 years after sinus infection and 6 months after heart surgery  Vaping Use   Vaping Use: Never used  Substance Use Topics   Alcohol use: No   Drug use: No    Review of Systems  Constitutional: No fever/chills Eyes: No visual changes.  ENT: No sore throat. Cardiovascular: As above Respiratory: Denies shortness of breath. Gastrointestinal: No abdominal pain.  No nausea, no vomiting.   Genitourinary: Negative for dysuria. Musculoskeletal: Negative for back pain. Skin: Negative for rash. Neurological: Negative for headaches    ____________________________________________   PHYSICAL EXAM:  VITAL SIGNS: ED Triage Vitals  Enc Vitals Group     BP 06/05/21 2136 (!) 160/75     Pulse Rate 06/05/21 2136 78     Resp 06/05/21 2136 20     Temp 06/05/21 2149 98.4 F (36.9 C)      Temp Source 06/05/21 2149 Oral     SpO2 06/05/21 2132 97 %     Weight 06/05/21 2138 72.3 kg (159 lb 6.4 oz)     Height 06/05/21 2138 1.6 m ('5\' 3"'$ )     Head Circumference --      Peak Flow --      Pain Score 06/05/21 2137 2     Pain Loc --      Pain Edu? --      Excl. in [place]? --     Constitutional: Alert and oriented. No acute distress.   Nose: No congestion/rhinnorhea. Mouth/Throat: Mucous membranes are moist.   Neck:  Painless ROM Cardiovascular: Normal rate, regular rhythm. Grossly normal heart sounds.  Good peripheral circulation. Respiratory: Normal respiratory effort.  No retractions. Lungs CTAB. Gastrointestinal: Soft and nontender. No distention.    Musculoskeletal: No lower extremity tenderness nor edema.  Warm and well perfused Neurologic:  Normal speech and language. No gross focal neurologic deficits are appreciated.  Skin:  Skin is warm, dry and intact. No rash noted. Psychiatric: Mood and affect are normal. Speech and behavior are normal.  ____________________________________________   LABS (all labs ordered are listed, but only abnormal results are displayed)  Labs Reviewed  CBC - Abnormal; Notable for the following components:      Result Value   RBC 3.54 (*)    Hemoglobin 10.6 (*)    HCT 30.8 (*)    All other components within normal limits  COMPREHENSIVE METABOLIC PANEL  TROPONIN I (HIGH SENSITIVITY)   ____________________________________________  EKG  ED ECG REPORT I, Lavonia Drafts, the attending physician, personally viewed and interpreted this ECG.  Date: 06/05/2021  Rhythm: normal sinus rhythm QRS Axis: normal Intervals: normal ST/T Wave abnormalities: normal Narrative Interpretation: no evidence of acute ischemia  ____________________________________________  RADIOLOGY  Chest x-ray reviewed by me, no acute abnormality ____________________________________________   PROCEDURES  Procedure(s) performed:yes  .1-3 Lead EKG  Interpretation  Date/Time: 06/05/2021 10:07 PM Performed by: Lavonia Drafts, MD Authorized by: Lavonia Drafts, MD     Interpretation: normal     ECG rate assessment: normal     Rhythm: sinus rhythm     Ectopy: none     Conduction: normal     Critical Care performed: No ____________________________________________   INITIAL IMPRESSION / ASSESSMENT AND PLAN / ED COURSE  Pertinent labs & imaging results that were available during my care of the patient were reviewed by me and considered in my medical decision making (see chart for details).   Patient presents with chest pain now resolved as detailed above.  Differential includes ACS although EKG is reassuring and patient is asymptomatic at this time.  Differential includes angina.  Not consistent with PE or dissection.  Pending labs including high-sensitivity troponin, chest x-ray.  Will monitor the patient closely  Initial troponin within normal limits, will need 2-hour delta troponin, have asked my colleague to follow-up on this    ____________________________________________   FINAL CLINICAL IMPRESSION(S) / ED DIAGNOSES  Final diagnoses:  Chest pain, unspecified type        Note:  This document was prepared using Dragon voice recognition software and may include unintentional dictation errors.    Lavonia Drafts, MD 06/05/21 2231

## 2021-06-06 ENCOUNTER — Observation Stay
Admit: 2021-06-06 | Discharge: 2021-06-06 | Disposition: A | Discharge status: Home / Self Care | Payer: Medicare Other | Attending: Internal Medicine | Admitting: Internal Medicine

## 2021-06-06 DIAGNOSIS — D631 Anemia in chronic kidney disease: Secondary | ICD-10-CM

## 2021-06-06 DIAGNOSIS — K2100 Gastro-esophageal reflux disease with esophagitis, without bleeding: Secondary | ICD-10-CM | POA: Diagnosis not present

## 2021-06-06 DIAGNOSIS — I2510 Atherosclerotic heart disease of native coronary artery without angina pectoris: Secondary | ICD-10-CM | POA: Diagnosis present

## 2021-06-06 DIAGNOSIS — N189 Chronic kidney disease, unspecified: Secondary | ICD-10-CM | POA: Diagnosis not present

## 2021-06-06 DIAGNOSIS — G459 Transient cerebral ischemic attack, unspecified: Secondary | ICD-10-CM

## 2021-06-06 DIAGNOSIS — I739 Peripheral vascular disease, unspecified: Secondary | ICD-10-CM | POA: Diagnosis not present

## 2021-06-06 DIAGNOSIS — I214 Non-ST elevation (NSTEMI) myocardial infarction: Secondary | ICD-10-CM | POA: Diagnosis not present

## 2021-06-06 DIAGNOSIS — E1140 Type 2 diabetes mellitus with diabetic neuropathy, unspecified: Secondary | ICD-10-CM

## 2021-06-06 DIAGNOSIS — E7800 Pure hypercholesterolemia, unspecified: Secondary | ICD-10-CM

## 2021-06-06 LAB — ECHOCARDIOGRAM COMPLETE
AR max vel: 1.6 cm2
AV Area VTI: 1.44 cm2
AV Area mean vel: 1.66 cm2
AV Mean grad: 6 mmHg
AV Peak grad: 10.8 mmHg
Ao pk vel: 1.64 m/s
Area-P 1/2: 2.65 cm2
Height: 63 in
MV VTI: 1.46 cm2
S' Lateral: 2.5 cm
Weight: 2550.4 oz

## 2021-06-06 LAB — HEPARIN LEVEL (UNFRACTIONATED)
Heparin Unfractionated: 0.52 IU/mL (ref 0.30–0.70)
Heparin Unfractionated: 0.24 IU/mL — ABNORMAL LOW (ref 0.30–0.70)

## 2021-06-06 LAB — RESP PANEL BY RT-PCR (FLU A&B, COVID) ARPGX2
Influenza A by PCR: NEGATIVE
Influenza B by PCR: NEGATIVE
SARS Coronavirus 2 by RT PCR: NEGATIVE

## 2021-06-06 LAB — LIPID PANEL
Cholesterol: 116 mg/dL (ref 0–200)
HDL: 44 mg/dL (ref >40)
LDL Cholesterol: 48 mg/dL (ref 0–99)
Total CHOL/HDL Ratio: 2.6 RATIO
Triglycerides: 122 mg/dL (ref <150)
VLDL: 24 mg/dL (ref 0–40)

## 2021-06-06 LAB — BASIC METABOLIC PANEL
Anion gap: 8 (ref 5–15)
BUN: 27 mg/dL — ABNORMAL HIGH (ref 8–23)
CO2: 27 mmol/L (ref 22–32)
Calcium: 9.5 mg/dL (ref 8.9–10.3)
Chloride: 109 mmol/L (ref 98–111)
Creatinine, Ser: 0.87 mg/dL (ref 0.44–1.00)
GFR, Estimated: >60 mL/min (ref >60)
Glucose, Bld: 127 mg/dL — ABNORMAL HIGH (ref 70–99)
Potassium: 3.6 mmol/L (ref 3.5–5.1)
Sodium: 144 mmol/L (ref 135–145)

## 2021-06-06 LAB — TROPONIN I (HIGH SENSITIVITY)
Troponin I (High Sensitivity): 108 ng/L (ref <18)
Troponin I (High Sensitivity): 125 ng/L (ref <18)
Troponin I (High Sensitivity): 74 ng/L — ABNORMAL HIGH

## 2021-06-06 LAB — CBC
HCT: 30.9 % — ABNORMAL LOW (ref 36.0–46.0)
Hemoglobin: 10.5 g/dL — ABNORMAL LOW (ref 12.0–15.0)
MCH: 29.6 pg (ref 26.0–34.0)
MCHC: 34 g/dL (ref 30.0–36.0)
MCV: 87 fL (ref 80.0–100.0)
Platelets: 171 10*3/uL (ref 150–400)
RBC: 3.55 MIL/uL — ABNORMAL LOW (ref 3.87–5.11)
RDW: 13.5 % (ref 11.5–15.5)
WBC: 5 10*3/uL (ref 4.0–10.5)
nRBC: 0 % (ref 0.0–0.2)

## 2021-06-06 LAB — HEMOGLOBIN A1C
Hgb A1c MFr Bld: 7 % — ABNORMAL HIGH (ref 4.8–5.6)
Mean Plasma Glucose: 154.2 mg/dL

## 2021-06-06 LAB — PROTIME-INR
INR: 1.1 (ref 0.8–1.2)
Prothrombin Time: 13.8 seconds (ref 11.4–15.2)

## 2021-06-06 LAB — APTT: aPTT: 144 seconds — ABNORMAL HIGH (ref 24–36)

## 2021-06-06 MED ORDER — PERFLUTREN LIPID MICROSPHERE
1.0000 mL | INTRAVENOUS | Status: AC | PRN
  Administered 2021-06-06: 3 mL via INTRAVENOUS
  Filled 2021-06-06: qty 10

## 2021-06-06 MED ORDER — PANTOPRAZOLE SODIUM 40 MG PO TBEC
40.0000 mg | DELAYED_RELEASE_TABLET | Freq: Every day | ORAL | Status: DC
  Administered 2021-06-06 – 2021-06-08 (×2): 40 mg via ORAL
  Filled 2021-06-06 (×2): qty 1

## 2021-06-06 MED ORDER — HEPARIN (PORCINE) 25000 UT/250ML-% IV SOLN
1000.0000 [IU]/h | INTRAVENOUS | Status: DC
  Administered 2021-06-06: 1000 [IU]/h via INTRAVENOUS
  Administered 2021-06-06: 850 [IU]/h via INTRAVENOUS
  Filled 2021-06-06 (×2): qty 250

## 2021-06-06 MED ORDER — HEPARIN BOLUS VIA INFUSION
4000.0000 [IU] | INTRAVENOUS | Status: AC
  Administered 2021-06-06: 4000 [IU] via INTRAVENOUS
  Filled 2021-06-06: qty 4000

## 2021-06-06 MED ORDER — HEPARIN BOLUS VIA INFUSION
1000.0000 [IU] | Freq: Once | INTRAVENOUS | Status: AC
  Administered 2021-06-06: 1000 [IU] via INTRAVENOUS
  Filled 2021-06-06: qty 1000

## 2021-06-06 MED ORDER — SODIUM CHLORIDE 0.9% FLUSH
3.0000 mL | Freq: Two times a day (BID) | INTRAVENOUS | Status: DC
  Administered 2021-06-06 – 2021-06-08 (×3): 3 mL via INTRAVENOUS

## 2021-06-06 MED ORDER — OCUVITE-LUTEIN PO CAPS
ORAL_CAPSULE | Freq: Two times a day (BID) | ORAL | Status: DC
  Administered 2021-06-06 – 2021-06-08 (×4): 1 via ORAL
  Filled 2021-06-06 (×6): qty 1

## 2021-06-06 MED ORDER — ASPIRIN EC 81 MG PO TBEC
81.0000 mg | DELAYED_RELEASE_TABLET | Freq: Every day | ORAL | Status: DC
  Administered 2021-06-06 – 2021-06-08 (×3): 81 mg via ORAL
  Filled 2021-06-06 (×2): qty 1

## 2021-06-06 MED ORDER — ONDANSETRON HCL 4 MG/2ML IJ SOLN: 4.0000 mg | Freq: Four times a day (QID) | INTRAMUSCULAR | Status: DC | PRN

## 2021-06-06 MED ORDER — ROSUVASTATIN CALCIUM 10 MG PO TABS
40.0000 mg | ORAL_TABLET | Freq: Every day | ORAL | Status: DC
  Administered 2021-06-06 – 2021-06-07 (×2): 40 mg via ORAL
  Filled 2021-06-06: qty 4
  Filled 2021-06-06: qty 2

## 2021-06-06 MED ORDER — ACETAMINOPHEN 325 MG PO TABS
650.0000 mg | ORAL_TABLET | ORAL | Status: DC | PRN
  Administered 2021-06-07: 650 mg via ORAL
  Filled 2021-06-06: qty 2

## 2021-06-06 MED ORDER — ISOSORBIDE MONONITRATE ER 60 MG PO TB24
30.0000 mg | ORAL_TABLET | Freq: Every day | ORAL | Status: DC
  Administered 2021-06-06: 30 mg via ORAL
  Filled 2021-06-06 (×2): qty 1

## 2021-06-06 NOTE — ED Notes (Signed)
Gave dr. Alfred Levins verbal that troponin was elevated. Received phone call from lab.

## 2021-06-06 NOTE — ED Notes (Signed)
Patient provided toothpaste, toothbrush, a comb, deodorant, and lotion per request. [facility] crackers and peanut butter also given.

## 2021-06-06 NOTE — Progress Notes (Signed)
ANTICOAGULATION CONSULT NOTE   Pharmacy Consult for heparin infusion Indication: ACS/nSTEMI  Allergies  Allergen Reactions   Telmisartan Other (See Comments)    Acute renal failure   Niacin And Related Hives   Sertraline Hcl Other (See Comments)    Hallucinations,     Sulfa Drugs Cross Reactors Nausea Only   Tetracyclines & Related Hives   Latex Rash    RAST testing was NEGATIVE  for LATEX   Simvastatin Rash    *Antihyperlipidemics*; elevated LFT's.     Patient Measurements: Height: '5\' 3"'$  (160 cm) Weight: 72.3 kg (159 lb 6.4 oz) IBW/kg (Calculated) : 52.4 Heparin Dosing Weight: 67.5 kg  Vital Signs: Temp: 98.1 F (36.7 C) (08/01 0820) Temp Source: Oral (08/01 0820) BP: 124/51 (08/01 0820) Pulse Rate: 68 (08/01 0820)  Labs: Recent Labs    06/05/21 2134 06/05/21 2343 06/06/21 0204 06/06/21 0519  HGB 10.6*  --   --  10.5*  HCT 30.8*  --   --  30.9*  PLT 173  --   --  171  APTT  --   --   --  144*  LABPROT  --   --   --  13.8  INR  --   --   --  1.1  HEPARINUNFRC  --   --   --  0.52  CREATININE 0.99  --   --  0.87  TROPONINIHS 15 74* 125* 108*     Estimated Creatinine Clearance: 50 mL/min (by C-G formula based on SCr of 0.87 mg/dL).   Medical History: Past Medical History:  Diagnosis Date   Abdominal aortic aneurysm without mention of rupture    infrarenal, stable, folllowed by Leotis Pain   Acute posthemorrhagic anemia    Arthritis    B12 deficiency    CAD (coronary artery disease), autologous vein bypass graft    Cardiac dysrhythmia, unspecified    Colon polyp    Depression    Diverticulitis of colon    Dizziness    DUE TO MEDICINES   Dysrhythmia    GERD (gastroesophageal reflux disease)    HOH (hard of hearing)    Hyperlipidemia    Hypertension    pt denies. placed on meds after CABG   IBS (irritable bowel syndrome)    Neuropathy    Neuropathy 07/01/2013   Peripheral vascular disease (HCC)    s/p CEA    Reflux esophagitis    Rheumatic  fever    possible at age 56   Sliding hiatal hernia    Tobacco abuse    Tobacco abuse     Assessment: Pt is 80 yo female w/ hx of CAD, CABG, HTN, PVD presenting with c/o of sudden onset chest pain & SOB.  Pt found with Troponin I level trending up from 15 to 75.  8/1 0519 HL 0.52   Goal of Therapy:  Heparin level 0.3-0.7 units/ml Monitor platelets by anticoagulation protocol: Yes   Plan:  Heparin level is therapeutic. Will continue heparin infusion at 850 units/hr. Recheck heparin level in 8 hours. CBC daily while on heparin.    Oswald Hillock PharmD 06/06/2021 9:24 AM

## 2021-06-06 NOTE — Progress Notes (Signed)
ANTICOAGULATION CONSULT NOTE   Pharmacy Consult for heparin infusion Indication: ACS/nSTEMI  Allergies  Allergen Reactions   Telmisartan Other (See Comments)    Acute renal failure   Niacin And Related Hives   Sertraline Hcl Other (See Comments)    Hallucinations,     Sulfa Drugs Cross Reactors Nausea Only   Tetracyclines & Related Hives   Latex Rash    RAST testing was NEGATIVE  for LATEX   Simvastatin Rash    *Antihyperlipidemics*; elevated LFT's.     Patient Measurements: Height: '5\' 3"'$  (160 cm) Weight: 72.3 kg (159 lb 6.4 oz) IBW/kg (Calculated) : 52.4 Heparin Dosing Weight: 67.5 kg  Vital Signs: Temp: 98.4 F (36.9 C) (08/01 1632) Temp Source: Oral (08/01 1632) BP: 126/64 (08/01 1632) Pulse Rate: 63 (08/01 1632)  Labs: Recent Labs    06/05/21 2134 06/05/21 2343 06/06/21 0204 06/06/21 0519 06/06/21 1642  HGB 10.6*  --   --  10.5*  --   HCT 30.8*  --   --  30.9*  --   PLT 173  --   --  171  --   APTT  --   --   --  144*  --   LABPROT  --   --   --  13.8  --   INR  --   --   --  1.1  --   HEPARINUNFRC  --   --   --  0.52 0.24*  CREATININE 0.99  --   --  0.87  --   TROPONINIHS 15 74* 125* 108*  --      Estimated Creatinine Clearance: 50 mL/min (by C-G formula based on SCr of 0.87 mg/dL).   Medical History: Past Medical History:  Diagnosis Date   Abdominal aortic aneurysm without mention of rupture    infrarenal, stable, folllowed by Leotis Pain   Acute posthemorrhagic anemia    Arthritis    B12 deficiency    CAD (coronary artery disease), autologous vein bypass graft    Cardiac dysrhythmia, unspecified    Colon polyp    Depression    Diverticulitis of colon    Dizziness    DUE TO MEDICINES   Dysrhythmia    GERD (gastroesophageal reflux disease)    HOH (hard of hearing)    Hyperlipidemia    Hypertension    pt denies. placed on meds after CABG   IBS (irritable bowel syndrome)    Neuropathy    Neuropathy 07/01/2013   Peripheral vascular  disease (HCC)    s/p CEA    Reflux esophagitis    Rheumatic fever    possible at age 40   Sliding hiatal hernia    Tobacco abuse    Tobacco abuse     Assessment: Pt is 80 yo female w/ hx of CAD, CABG, HTN, PVD presenting with c/o of sudden onset chest pain & SOB.  Pt found with Troponin I level trending up from 15 to 75.  8/1 0519 HL 0.52 therapeutic x 1 @ 850 units/hr 8/1 1642 HL 0.24   Goal of Therapy:  Heparin level 0.3-0.7 units/ml Monitor platelets by anticoagulation protocol: Yes   Plan:  Heparin level is subtherapeutic -- confirmed with nurse no interruptions  Bolus 1000 units x 1  Will increase heparin infusion to 1000 units/hr.  Recheck heparin level 8 hours following rate change CBC daily while on heparin.  Continue 24-48 hrs per cardiology    Dorothe Pea PharmD, BCPS 06/06/2021 7:14 PM

## 2021-06-06 NOTE — Progress Notes (Signed)
*  PRELIMINARY RESULTS* Echocardiogram 2D Echocardiogram has been performed.  Sabrina Montoya 06/06/2021, 1:25 PM

## 2021-06-06 NOTE — Consult Note (Signed)
CARDIOLOGY CONSULT NOTE               Patient ID: Sabrina Montoya MRN: [id number] DOB/AGE: 06-15-1941 80 y.o.  Admit date: 06/05/2021 Referring Physician Trilby Drummer Primary Physician Derrel Nip Primary Cardiologist [place], [facility] Reason for Consultation NSTEMI  HPI: 80 year old female referred for evaluation of NSTEMI. The patient has a history of CABG x 3 in 2006, essential hypertension, hyperlipidemia, previous tobacco use, quite 2013, history of syncope with unremarkable work-up, carotid disease, status post endarterectomy. The patient states she was in her usual state of health last night at the movie theater. Upon walking out of the restroom, she felt acutely short of breath with centralized chest pressure without radiation, diaphoresis, or nausea. Her granddaughter helped her to the car, and EMS was called. The patient states her chest pain started to ease off after receiving aspirin and sublingual nitro spray. In the ED, high sensitivity troponin was 15-> 74-> 125-> 108. ECG revealed sinus rhythm with nonspecific T wave abnormalities. Chest xray negative for acute abnormalities. She was started on heparin drip. At this time, she denies chest pain or shortness of breath. She does report a recent history of progressive exertional dyspnea, notable when walking to and from her mailbox. Most recent cardiac catheterization in 11/2015 revealed severe native multivessel CAD with 2/3 patent grafts (patent LIMA to LAD and patent SVG to PDA with 100% stenosis ostial SVG to 1st marginal graft.)  Review of systems complete and found to be negative unless listed above     Past Medical History:  Diagnosis Date   Abdominal aortic aneurysm without mention of rupture    infrarenal, stable, folllowed by Leotis Pain   Acute posthemorrhagic anemia    Arthritis    B12 deficiency    CAD (coronary artery disease), autologous vein bypass graft    Cardiac dysrhythmia, unspecified    Colon polyp     Depression    Diverticulitis of colon    Dizziness    DUE TO MEDICINES   Dysrhythmia    GERD (gastroesophageal reflux disease)    HOH (hard of hearing)    Hyperlipidemia    Hypertension    pt denies. placed on meds after CABG   IBS (irritable bowel syndrome)    Neuropathy    Neuropathy 07/01/2013   Peripheral vascular disease ([place])    s/p CEA    Reflux esophagitis    Rheumatic fever    possible at age 44   Sliding hiatal hernia    Tobacco abuse    Tobacco abuse     Past Surgical History:  Procedure Laterality Date   ABDOMINAL HYSTERECTOMY     ABDOMINAL SURGERY  1976   for pain secondary to scar tissue, s/p apply   APPENDECTOMY  1974   BACK SURGERY     CAROTID ENDARTERECTOMY     CATARACT EXTRACTION W/PHACO Right 10/18/2016   Procedure: CATARACT EXTRACTION PHACO AND INTRAOCULAR LENS PLACEMENT ([place]);  Surgeon: Estill Cotta, MD;  Location: [facility] ORS;  Service: Ophthalmology;  Laterality: Right;  Lot # [id number] H [place]: 01:04.0 AP%: 24.4 CDE: 30.44   CATARACT EXTRACTION W/PHACO Left 03/14/2017   Procedure: CATARACT EXTRACTION PHACO AND INTRAOCULAR LENS PLACEMENT ([place])  Left;  Surgeon: Leandrew Koyanagi, MD;  Location: [place];  Service: Ophthalmology;  Laterality: Left;   CEA     Carotid stenosis found during workup for dysphagia,  [place]  2009   Dr. Hoover Brunette for cervical cord stenosis, C5-6  diskectomy   CHOLECYSTECTOMY  2002   Dr. Pat Patrick   CLOSED MANIPULATION SHOULDER     left shoulder post physical therapy, Right shoulder redo (toggle bolts)   COLONOSCOPY  09/01/14   coronary angiography  01/2011   one occluded artery with collateralization, other arteries patent   CORONARY ARTERY BYPASS GRAFT  2006   s/p triple bypass surgery, [facility]   heart catherization  2016   HERNIA REPAIR  may 2011   Dr. Pat Patrick   hernia repair     LUMBAR [place]   L5, unssuccessful, fusion in 1983 successful (2 lumbar)   PARTIAL  HYSTERECTOMY     ROTATOR CUFF REPAIR  2002   right shoulder Dr. Nadean Corwin, [place] in [place]      (Not in a hospital admission)  Social History   Socioeconomic History   Marital status: Widowed    Spouse name: Not on file   Number of children: Not on file   Years of education: Not on file   Highest education level: Not on file  Occupational History   Not on file  Tobacco Use   Smoking status: Former    Packs/day: 0.50    Years: 30.00    Pack years: 15.00    Types: Cigarettes    Quit date: 04/01/2012    Years since quitting: 9.1   Smokeless tobacco: Never   Tobacco comments:    quit for 2 years after sinus infection and 6 months after heart surgery  Vaping Use   Vaping Use: Never used  Substance and Sexual Activity   Alcohol use: No   Drug use: No   Sexual activity: Not Currently  Other Topics Concern   Not on file  Social History Narrative   Widowed   Social Determinants of Health   Financial Resource Strain: Medium Risk   Difficulty of Paying Living Expenses: Somewhat hard  Food Insecurity: No Food Insecurity   Worried About Charity fundraiser in the Last Year: Never true   Ran Out of Food in the Last Year: Never true  Transportation Needs: No Transportation Needs   Lack of Transportation (Medical): No   Lack of Transportation (Non-Medical): No  Physical Activity: Not on file  Stress: No Stress Concern Present   Feeling of Stress : Only a little  Social Connections: Unknown   Frequency of Communication with Friends and Family: More than three times a week   Frequency of Social Gatherings with Friends and Family: More than three times a week   Attends Religious Services: Not on Electrical engineer or Organizations: Not on file   Attends Archivist Meetings: Not on file   Marital Status: Not on file  Intimate Partner Violence: Not At Risk   Fear of Current or Ex-Partner: No   Emotionally Abused: No    Physically Abused: No   Sexually Abused: No    Family History  Problem Relation Age of Onset   Diabetes Mother    Heart disease Mother    Other Father        suicide   Cancer Sister        cervical cancer   Diabetes Sister    Diabetes Sister    Cancer Sister        breast   Breast cancer Sister    Heart disease Brother        coronary artery disease   Other Brother  suicide   Other Other        colon resection due to inflammation -nephew   Bladder Cancer Neg Hx    Kidney cancer Neg Hx       Review of systems complete and found to be negative unless listed above      PHYSICAL EXAM  General: Well developed, well nourished, in no acute distress, lying flat in bed HEENT:  Normocephalic and atramatic Neck:  No JVD.  Lungs: Clear bilaterally to auscultation, normal effort of breathing on room air. Heart: HRRR . Normal S1 and S2 without gallops or murmurs.  Abdomen: nondistended Msk:  Normal strength and tone for age. Extremities: No clubbing, cyanosis or edema.   Neuro: Alert and oriented X 3. Psych:  Good affect, responds appropriately  Labs:   Lab Results  Component Value Date   WBC 5.0 06/06/2021   HGB 10.5 (L) 06/06/2021   HCT 30.9 (L) 06/06/2021   MCV 87.0 06/06/2021   PLT 171 06/06/2021    Recent Labs  Lab 06/05/21 2134 06/06/21 0519  NA 138 144  K 3.7 3.6  CL 108 109  CO2 23 27  BUN 30* 27*  CREATININE 0.99 0.87  CALCIUM 9.4 9.5  PROT 7.1  --   BILITOT 0.7  --   ALKPHOS 61  --   ALT 21  --   AST 43*  --   GLUCOSE 120* 127*   Lab Results  Component Value Date   TROPONINI < 0.02 05/06/2012    Lab Results  Component Value Date   CHOL 116 06/06/2021   CHOL 141 04/27/2021   CHOL 119 05/13/2020   Lab Results  Component Value Date   HDL 44 06/06/2021   HDL 44.60 04/27/2021   HDL 38.20 (L) 05/13/2020   Lab Results  Component Value Date   LDLCALC 48 06/06/2021   LDLCALC 63 04/27/2021   LDLCALC 44 05/13/2020   Lab Results   Component Value Date   TRIG 122 06/06/2021   TRIG 169.0 (H) 04/27/2021   TRIG 186.0 (H) 05/13/2020   Lab Results  Component Value Date   CHOLHDL 2.6 06/06/2021   CHOLHDL 3 04/27/2021   CHOLHDL 3 05/13/2020   Lab Results  Component Value Date   LDLDIRECT 48.0 04/18/2018   LDLDIRECT 137.0 11/13/2017   LDLDIRECT 53.0 02/09/2017      Radiology: DG Chest Port 1 View  Result Date: 06/05/2021 CLINICAL DATA:  Acute onset of chest pain and shortness of breath today. Coronary artery disease. EXAM: PORTABLE CHEST 1 VIEW COMPARISON:  09/16/2019 FINDINGS: The heart size and mediastinal contours are within normal limits. Prior CABG again noted. Aortic atherosclerotic calcification noted. Both lungs are clear. Cervical spine fusion hardware again noted. IMPRESSION: No active disease. Electronically Signed   By: Marlaine Hind M.D.   On: 06/05/2021 22:11    EKG: sinus rhythm, nonspecific T wave abnormalities  ASSESSMENT AND PLAN:   NSTEMI, presenting with acute chest pain and shortness of breath, relieved with SL NTG, with nonspecific T wave abnormalities and borderline elevated HS-troponin that is now down-trending ( 15- 74- 125- 108), currently on heparin drip without recurrent chest pain. Coronary artery disease, status post CABG in 2006, with most recent [place] in 2017 revealing multivessel native CAD with 2/3 patent grafts.  Hyperlipidemia, well controlled on Crestor Hypertension, only on isosorbide mononitrate; was previously on metoprolol, but was discontinued in 2020 due to hypotension. Blood pressure currently soft. Carotid disease, status post endarterectomy  Plan: Continue  heparin drip for 48-72 hours Continue statin, isosorbide mononitrate 2D echocardiogram today Discussed cardiac catheterization for further evaluation with patient, and she is hesitant at this time, and wishes to discuss further with her daughters. Will defer adding beta blocker due to soft blood pressure; will defer  ACEi/ARB due to recent AKI associated with ARB  6.   Patient may eat today; possible LHC tomorrow  Signed: Clabe Seal PA-C 06/06/2021, 8:47 AM  Discussed with Dr. Saralyn Pilar and plan made in collaboration with him.

## 2021-06-06 NOTE — ED Provider Notes (Signed)
Accepted care of this patient from Dr. Corky Downs at 11 PM pending repeat troponin.  Repeat troponin is trending up from 15-75.  Patient remains chest pain-free.  Presentation is concerning for NSTEMI.  We will start patient on heparin and admit to the hospitalist service.  Plan and results discussed with patient, her granddaughter and daughter over the phone.    CRITICAL CARE Performed by: Rudene Re  ?  Total critical care time: 30 min  Critical care time was exclusive of separately billable procedures and treating other patients.  Critical care was necessary to treat or prevent imminent or life-threatening deterioration.  Critical care was time spent personally by me on the following activities: development of treatment plan with patient and/or surrogate as well as nursing, discussions with consultants, evaluation of patient's response to treatment, examination of patient, obtaining history from patient or surrogate, ordering and performing treatments and interventions, ordering and review of laboratory studies, ordering and review of radiographic studies, pulse oximetry and re-evaluation of patient's condition.    Rudene Re, MD 06/06/21 [phone]

## 2021-06-06 NOTE — Progress Notes (Signed)
ANTICOAGULATION CONSULT NOTE   Pharmacy Consult for heparin infusion Indication: ACS/nSTEMI  Allergies  Allergen Reactions   Telmisartan Other (See Comments)    Acute renal failure   Niacin And Related Hives   Sertraline Hcl Other (See Comments)    Hallucinations,     Sulfa Drugs Cross Reactors Nausea Only   Tetracyclines & Related Hives   Latex Rash    RAST testing was NEGATIVE  for LATEX   Simvastatin Rash    *Antihyperlipidemics*; elevated LFT's.     Patient Measurements: Height: '5\' 3"'$  (160 cm) Weight: 72.3 kg (159 lb 6.4 oz) IBW/kg (Calculated) : 52.4 Heparin Dosing Weight: 67.5 kg  Vital Signs: Temp: 98.4 F (36.9 C) (07/31 2149) Temp Source: Oral (07/31 2149) BP: 120/57 (08/01 0000) Pulse Rate: 67 (08/01 0000)  Labs: Recent Labs    06/05/21 2134 06/05/21 2343  HGB 10.6*  --   HCT 30.8*  --   PLT 173  --   CREATININE 0.99  --   TROPONINIHS 15 74*    Estimated Creatinine Clearance: 43.9 mL/min (by C-G formula based on SCr of 0.99 mg/dL).   Medical History: Past Medical History:  Diagnosis Date   Abdominal aortic aneurysm without mention of rupture    infrarenal, stable, folllowed by Leotis Pain   Acute posthemorrhagic anemia    Arthritis    B12 deficiency    CAD (coronary artery disease), autologous vein bypass graft    Cardiac dysrhythmia, unspecified    Colon polyp    Depression    Diverticulitis of colon    Dizziness    DUE TO MEDICINES   Dysrhythmia    GERD (gastroesophageal reflux disease)    HOH (hard of hearing)    Hyperlipidemia    Hypertension    pt denies. placed on meds after CABG   IBS (irritable bowel syndrome)    Neuropathy    Neuropathy 07/01/2013   Peripheral vascular disease (HCC)    s/p CEA    Reflux esophagitis    Rheumatic fever    possible at age 34   Sliding hiatal hernia    Tobacco abuse    Tobacco abuse     Assessment: Pt is 81 yo female w/ hx of CAD, CABG, HTN, PVD presenting with c/o of sudden onset chest  pain & SOB.  Pt found with Troponin I level trending up from 15 to 75.  Goal of Therapy:  Heparin level 0.3-0.7 units/ml Monitor platelets by anticoagulation protocol: Yes   Plan:  Give 4000 units bolus x 1 Start heparin infusion at 850 units/hr Check anti-Xa level in 8 hours and daily while on heparin Continue to monitor H&H and platelets  Jerilynn Mages, PharmD, [facility] 06/06/2021 12:50 AM

## 2021-06-06 NOTE — ED Notes (Signed)
Called and spoke to dr. Hal Hope gave critical troponin 125. No new orders

## 2021-06-06 NOTE — Progress Notes (Signed)
PROGRESS NOTE    Sabrina Montoya  [id number] DOB: 1941/03/11 DOA: 06/05/2021 PCP: Crecencio Mc, MD   Assessment & Plan:   Principal Problem:   NSTEMI (non-ST elevated myocardial infarction) [facility]) Active Problems:   Hyperlipidemia   CAD (coronary artery disease), autologous vein bypass graft   Peripheral vascular disease (HCC)   Chronic reflux esophagitis   TIA (transient ischemic attack)   Anemia   Controlled type 2 diabetes with neuropathy ([place])   NSTEMI: CAD s/p CABG. Nitro, morphine prn. Continue on IV heparin. Echo ordered. Will go for cardiac cath tomorrow. Continue on statin, aspirin & imdur. Continue on tele. Cardio consulted   PVD: continue aspirin, statin   HLD: continue on statin   Hx of TIA: continue on aspirin, statin    CKD: unknown baseline Cr/GFR, currently II. Cr is stable   ACD: likely secondary to CKD. H&H are stable    GERD: continue on PPI   DM2: HbA1c was 7.3, approx 1 month ago, poorly controlled. Not currently on meds for this. Likely diet controlled    DVT prophylaxis: IV heparin  Code Status:  full  Family Communication: discussed pt's care w/ pt's family at bedside and answered their questions Disposition Plan: likely d/c back home   Level of care: Med-Surg  Status is: Observation  The patient remains OBS appropriate and will d/c before 2 midnights.  Dispo: The patient is from: Home              Anticipated d/c is to: Home              Patient currently is not medically stable to d/c.   Difficult to place patient : unclear    Consultants:  cardio  Procedures:   Antimicrobials:   Subjective: Pt c/o fatigue   Objective: Vitals:   06/06/21 0300 06/06/21 0330 06/06/21 0500 06/06/21 0707  BP: 140/68 (!) 100/51 (!) 151/71   Pulse: 62 70 65 77  Resp: '15 14 19 15  '$ Temp:      TempSrc:      SpO2: 94% 93% 99% 99%  Weight:      Height:       No intake or output data in the 24 hours ending 06/06/21 0758 Filed Weights    06/05/21 2138  Weight: 72.3 kg    Examination:  General exam: Appears calm and comfortable  Respiratory system: Clear to auscultation. Respiratory effort normal. Cardiovascular system: S1 & S2 +. No rubs, gallops or clicks.  Gastrointestinal system: Abdomen is nondistended, soft and nontender. Normal bowel sounds heard. Central nervous system: Alert and oriented. Moves all extremities Psychiatry: Judgement and insight appear normal. Flat mood and affect    Data Reviewed: I have personally reviewed following labs and imaging studies  CBC: Recent Labs  Lab 06/05/21 2134 06/06/21 0519  WBC 5.5 5.0  HGB 10.6* 10.5*  HCT 30.8* 30.9*  MCV 87.0 87.0  PLT 173 [id number]   Basic Metabolic Panel: Recent Labs  Lab 06/05/21 2134 06/06/21 0519  NA 138 144  K 3.7 3.6  CL 108 109  CO2 23 27  GLUCOSE 120* 127*  BUN 30* 27*  CREATININE 0.99 0.87  CALCIUM 9.4 9.5   GFR: Estimated Creatinine Clearance: 50 mL/min (by C-G formula based on SCr of 0.87 mg/dL). Liver Function Tests: Recent Labs  Lab 06/05/21 2134  AST 43*  ALT 21  ALKPHOS 61  BILITOT 0.7  PROT 7.1  ALBUMIN 4.4   No results for input(s):  LIPASE, AMYLASE in the last 168 hours. No results for input(s): AMMONIA in the last 168 hours. Coagulation Profile: Recent Labs  Lab 06/06/21 0519  INR 1.1   Cardiac Enzymes: No results for input(s): CKTOTAL, CKMB, CKMBINDEX, TROPONINI in the last 168 hours. BNP (last 3 results) No results for input(s): PROBNP in the last 8760 hours. HbA1C: No results for input(s): HGBA1C in the last 72 hours. CBG: No results for input(s): GLUCAP in the last 168 hours. Lipid Profile: Recent Labs    06/06/21 0519  CHOL 116  HDL 44  LDLCALC 48  TRIG 122  CHOLHDL 2.6   Thyroid Function Tests: No results for input(s): TSH, T4TOTAL, FREET4, T3FREE, THYROIDAB in the last 72 hours. Anemia Panel: No results for input(s): VITAMINB12, FOLATE, FERRITIN, TIBC, IRON, RETICCTPCT in the last 72  hours. Sepsis Labs: No results for input(s): PROCALCITON, LATICACIDVEN in the last 168 hours.  Recent Results (from the past 240 hour(s))  Resp Panel by RT-PCR (Flu A&B, Covid) Nasopharyngeal Swab     Status: None   Collection Time: 06/06/21 12:40 AM   Specimen: Nasopharyngeal Swab; Nasopharyngeal(NP) swabs in vial transport medium  Result Value Ref Range Status   SARS Coronavirus 2 by RT PCR NEGATIVE NEGATIVE Final    Comment: (NOTE) SARS-CoV-2 target nucleic acids are NOT DETECTED.  The SARS-CoV-2 RNA is generally detectable in upper respiratory specimens during the acute phase of infection. The lowest concentration of SARS-CoV-2 viral copies this assay can detect is 138 copies/mL. A negative result does not preclude SARS-Cov-2 infection and should not be used as the sole basis for treatment or other patient management decisions. A negative result may occur with  improper specimen collection/handling, submission of specimen other than nasopharyngeal swab, presence of viral mutation(s) within the areas targeted by this assay, and inadequate number of viral copies(<138 copies/mL). A negative result must be combined with clinical observations, patient history, and epidemiological information. The expected result is Negative.  Fact Sheet for Patients:  EntrepreneurPulse.com.au  Fact Sheet for Healthcare Providers:  IncredibleEmployment.be  This test is no t yet approved or cleared by the [place] FDA and  has been authorized for detection and/or diagnosis of SARS-CoV-2 by FDA under an Emergency Use Authorization (EUA). This EUA will remain  in effect (meaning this test can be used) for the duration of the COVID-19 declaration under Section 564(b)(1) of the Act, 21 U.S.C.section 360bbb-3(b)(1), unless the authorization is terminated  or revoked sooner.       Influenza A by PCR NEGATIVE NEGATIVE Final   Influenza B by PCR NEGATIVE  NEGATIVE Final    Comment: (NOTE) The Xpert Xpress SARS-CoV-2/FLU/RSV plus assay is intended as an aid in the diagnosis of influenza from Nasopharyngeal swab specimens and should not be used as a sole basis for treatment. Nasal washings and aspirates are unacceptable for Xpert Xpress SARS-CoV-2/FLU/RSV testing.  Fact Sheet for Patients: EntrepreneurPulse.com.au  Fact Sheet for Healthcare Providers: IncredibleEmployment.be  This test is not yet approved or cleared by the [place] FDA and has been authorized for detection and/or diagnosis of SARS-CoV-2 by FDA under an Emergency Use Authorization (EUA). This EUA will remain in effect (meaning this test can be used) for the duration of the COVID-19 declaration under Section 564(b)(1) of the Act, 21 U.S.C. section 360bbb-3(b)(1), unless the authorization is terminated or revoked.  Performed at [facility], [address] [place], [place] [address]          Radiology Studies: [facility] Chest [place]  1 View  Result Date: 06/05/2021 CLINICAL DATA:  Acute onset of chest pain and shortness of breath today. Coronary artery disease. EXAM: PORTABLE CHEST 1 VIEW COMPARISON:  09/16/2019 FINDINGS: The heart size and mediastinal contours are within normal limits. Prior CABG again noted. Aortic atherosclerotic calcification noted. Both lungs are clear. Cervical spine fusion hardware again noted. IMPRESSION: No active disease. Electronically Signed   By: Marlaine Hind M.D.   On: 06/05/2021 22:11        Scheduled Meds:  aspirin EC  81 mg Oral Daily   isosorbide mononitrate  30 mg Oral Daily   multivitamin-lutein   Oral BID   pantoprazole  40 mg Oral Daily   rosuvastatin  40 mg Oral Daily   Continuous Infusions:  heparin 850 Units/hr (06/06/21 0056)     LOS: 0 days    Time spent: 35 mins     Wyvonnia Dusky, MD [facility] Pager [phone]  If 7PM-7AM, please contact  night-coverage 06/06/2021, 7:58 AM

## 2021-06-06 NOTE — ED Notes (Signed)
IV team at the bedside. 

## 2021-06-06 NOTE — H&P (Addendum)
History and Physical   Sabrina Montoya [id number] DOB: [birth year] DOA: 06/05/2021  PCP: Crecencio Mc, MD  Patient coming from: Home  Chief Complaint: Chest pain  HPI: Sabrina Montoya is a 80 y.o. female with medical history significant of AAA, anemia, arthritis, carotid artery disease, CAD status post CABG, PVD, CKD, diabetes, GERD, hyperlipidemia, IBS, insomnia, TIA, syncope who presents following episode of chest pain with shortness of breath. As above patient had an episode of chest pain with some associated shortness of breath earlier today.  This occurred when she was walking out of a movie.  Pain was described as an 8 out of 10 chest tightness/pressure. She states that she has been having some intermittent chest pain on exertion for the past month or so but it was not as severe as today's episode.  The pain today did improve after receiving aspirin and nitro.  She is currently chest pain-free. She denies fevers, chills, abdominal pain, constipation, diarrhea, nausea, vomiting.   ED Course: Vital signs in the ED significant for blood pressure in the 0000000 to [id number] systolic.  Lab work-up showed CMP with BUN stable at 30, glucose 120, AST mildly elevated 43.  CBC showed hemoglobin stable 10.6.  PT, PTT, INR pending.  Troponin initially 15 and then repeat was elevated at 74.  Chest x-ray with no acute abnormalities.  Patient started on heparin drip in the ED.  Review of Systems: As per HPI otherwise all other systems reviewed and are negative.  Past Medical History:  Diagnosis Date   Abdominal aortic aneurysm without mention of rupture    infrarenal, stable, folllowed by Leotis Pain   Acute posthemorrhagic anemia    Arthritis    B12 deficiency    CAD (coronary artery disease), autologous vein bypass graft    Cardiac dysrhythmia, unspecified    Colon polyp    Depression    Diverticulitis of colon    Dizziness    DUE TO MEDICINES   Dysrhythmia    GERD (gastroesophageal reflux disease)     HOH (hard of hearing)    Hyperlipidemia    Hypertension    pt denies. placed on meds after CABG   IBS (irritable bowel syndrome)    Neuropathy    Neuropathy 07/01/2013   Peripheral vascular disease ([place])    s/p CEA    Reflux esophagitis    Rheumatic fever    possible at age 27   Sliding hiatal hernia    Tobacco abuse    Tobacco abuse     Past Surgical History:  Procedure Laterality Date   ABDOMINAL HYSTERECTOMY     ABDOMINAL SURGERY  1976   for pain secondary to scar tissue, s/p apply   APPENDECTOMY  1974   BACK SURGERY     CAROTID ENDARTERECTOMY     CATARACT EXTRACTION W/PHACO Right 10/18/2016   Procedure: CATARACT EXTRACTION PHACO AND INTRAOCULAR LENS PLACEMENT ([place]);  Surgeon: Estill Cotta, MD;  Location: [facility] ORS;  Service: Ophthalmology;  Laterality: Right;  Lot # [id number] H [place]: 01:04.0 AP%: 24.4 CDE: 30.44   CATARACT EXTRACTION W/PHACO Left 03/14/2017   Procedure: CATARACT EXTRACTION PHACO AND INTRAOCULAR LENS PLACEMENT ([place])  Left;  Surgeon: Leandrew Koyanagi, MD;  Location: [place];  Service: Ophthalmology;  Laterality: Left;   CEA     Carotid stenosis found during workup for dysphagia,  [place] SURGERY  2009   Dr. Hoover Brunette for cervical cord stenosis, C5-6 diskectomy   CHOLECYSTECTOMY  2002  Dr. Pat Patrick   CLOSED MANIPULATION SHOULDER     left shoulder post physical therapy, Right shoulder redo (toggle bolts)   COLONOSCOPY  09/01/14   coronary angiography  01/2011   one occluded artery with collateralization, other arteries patent   CORONARY ARTERY BYPASS GRAFT  2006   s/p triple bypass surgery, [facility]   heart catherization  2016   HERNIA REPAIR  may 2011   Dr. Pat Patrick   hernia repair     LUMBAR [place]   L5, unssuccessful, fusion in 1983 successful (2 lumbar)   PARTIAL HYSTERECTOMY     ROTATOR CUFF REPAIR  2002   right shoulder Dr. Nadean Corwin, [place] in [place]  History  reports that she quit smoking about 9 years ago. Her smoking use included cigarettes. She has a 15.00 pack-year smoking history. She has never used smokeless tobacco. She reports that she does not drink alcohol and does not use drugs.  Allergies  Allergen Reactions   Telmisartan Other (See Comments)    Acute renal failure   Niacin And Related Hives   Sertraline Hcl Other (See Comments)    Hallucinations,     Sulfa Drugs Cross Reactors Nausea Only   Tetracyclines & Related Hives   Latex Rash    RAST testing was NEGATIVE  for LATEX   Simvastatin Rash    *Antihyperlipidemics*; elevated LFT's.     Family History  Problem Relation Age of Onset   Diabetes Mother    Heart disease Mother    Other Father        suicide   Cancer Sister        cervical cancer   Diabetes Sister    Diabetes Sister    Cancer Sister        breast   Breast cancer Sister    Heart disease Brother        coronary artery disease   Other Brother        suicide   Other Other        colon resection due to inflammation -nephew   Bladder Cancer Neg Hx    Kidney cancer Neg Hx   Reviewed on admission  Prior to Admission medications   Medication Sig Start Date End Date Taking? Authorizing Provider  aspirin 81 MG tablet Take 81 mg by mouth daily.   Yes [provider]  cholecalciferol (VITAMIN D) 1000 UNITS tablet Take 1,000 Units by mouth daily.   Yes [provider]  Cyanocobalamin (VITAMIN B12 PO) Take 1 Dose by mouth daily. GUMMY CHEWS; 3,000 mg   Yes [provider]  isosorbide mononitrate (IMDUR) 30 MG 24 hr tablet Take 30 mg by mouth daily.   Yes [provider]  methocarbamol (ROBAXIN) 500 MG tablet Take 500 mg by mouth at bedtime. 06/03/21  Yes [provider]  Multiple Vitamins-Minerals (PRESERVISION AREDS 2 PO) Take by mouth 2 (two) times daily.   Yes [provider]  omeprazole (PRILOSEC) 20 MG capsule TAKE 1 CAPSULE(20 MG) BY MOUTH DAILY  03/02/21  Yes Crecencio Mc, MD  rosuvastatin (CRESTOR) 40 MG tablet Take 1 tablet (40 mg total) by mouth daily. 06/05/18  Yes Crecencio Mc, MD  Turmeric (QC TUMERIC COMPLEX PO) Take 1 mg by mouth daily.   Yes [provider]  conjugated estrogens (PREMARIN) vaginal cream Place vaginally as needed. 05/31/18   [provider]    Physical  Exam: Vitals:   06/05/21 2150 06/05/21 2230 06/05/21 2300 06/06/21 0000  BP: (!) 143/72 (!) 128/56 (!) 110/53 (!) 120/57  Pulse:  68 71 67  Resp:  '17 14 14  '$ Temp:      TempSrc:      SpO2:  97% 97% 98%  Weight:      Height:       Physical Exam Constitutional:      General: She is not in acute distress.    Appearance: Normal appearance.  HENT:     Head: Normocephalic and atraumatic.     Mouth/Throat:     Mouth: Mucous membranes are moist.     Pharynx: Oropharynx is clear.  Eyes:     Extraocular Movements: Extraocular movements intact.     Pupils: Pupils are equal, round, and reactive to light.  Cardiovascular:     Rate and Rhythm: Normal rate and regular rhythm.     Pulses: Normal pulses.     Heart sounds: Murmur heard.  Pulmonary:     Effort: Pulmonary effort is normal. No respiratory distress.     Breath sounds: Normal breath sounds.  Abdominal:     General: Bowel sounds are normal. There is no distension.     Palpations: Abdomen is soft.     Tenderness: There is no abdominal tenderness.  Musculoskeletal:        General: No swelling or deformity.  Skin:    General: Skin is warm and dry.  Neurological:     General: No focal deficit present.     Mental Status: Mental status is at baseline.   Labs on Admission: I have personally reviewed following labs and imaging studies  CBC: Recent Labs  Lab 06/05/21 2134  WBC 5.5  HGB 10.6*  HCT 30.8*  MCV 87.0  PLT [id number]    Basic Metabolic Panel: Recent Labs  Lab 06/05/21 2134  NA 138  K 3.7  CL 108  CO2 23  GLUCOSE 120*  BUN 30*  CREATININE 0.99  CALCIUM 9.4     GFR: Estimated Creatinine Clearance: 43.9 mL/min (by C-G formula based on SCr of 0.99 mg/dL).  Liver Function Tests: Recent Labs  Lab 06/05/21 2134  AST 43*  ALT 21  ALKPHOS 61  BILITOT 0.7  PROT 7.1  ALBUMIN 4.4    Urine analysis:    Component Value Date/Time   COLORURINE YELLOW 12/21/2017 1140   APPEARANCEUR Clear 05/31/2018 1047   LABSPEC 1.025 12/21/2017 1140   LABSPEC 1.020 04/30/2012 1937   PHURINE 5.0 12/21/2017 1140   GLUCOSEU Negative 05/31/2018 1047   GLUCOSEU NEGATIVE 12/21/2017 1140   HGBUR NEGATIVE 12/21/2017 1140   BILIRUBINUR Negative 05/31/2018 1047   BILIRUBINUR Negative 04/30/2012 1937   KETONESUR NEGATIVE 12/21/2017 1140   PROTEINUR Negative 05/31/2018 1047   PROTEINUR NEGATIVE 08/15/2017 1013   UROBILINOGEN 0.2 04/18/2018 1004   UROBILINOGEN 0.2 12/21/2017 1140   NITRITE Negative 05/31/2018 1047   NITRITE NEGATIVE 12/21/2017 1140   LEUKOCYTESUR Trace (A) 05/31/2018 1047   LEUKOCYTESUR Trace 04/30/2012 1937    Radiological Exams on Admission: DG Chest Port 1 View  Result Date: 06/05/2021 CLINICAL DATA:  Acute onset of chest pain and shortness of breath today. Coronary artery disease. EXAM: PORTABLE CHEST 1 VIEW COMPARISON:  09/16/2019 FINDINGS: The heart size and mediastinal contours are within normal limits. Prior CABG again noted. Aortic atherosclerotic calcification noted. Both lungs are clear. Cervical spine fusion hardware again noted. IMPRESSION: No active disease. Electronically Signed   By: Jenny Reichmann  Tereso Newcomer M.D.   On: 06/05/2021 22:11    EKG: Not yet performed in ED.   Assessment/Plan Active Problems:   NSTEMI (non-ST elevated myocardial infarction) ([place])  NSTEMI CAD status post CABG > Patient presenting following an episode of chest tightness and pressure while walking out of a movie.  With some mild associated shortness of breath.  Pain relieved with nitro and aspirin. > Has had similar pain that was less severe intermittent for the  last month. > Has history of CAD and CABG > Started on heparin in the ED - Monitor on telemetry - EKG (I am unable to see any prior EKG from this admission if it is already been performed) - Message sent to cardiology for consult in the morning - Continue with heparin - Trend troponin - Echocardiogram - A.m. lipid panel and A1c - Continue home rosuvastatin, Imdur, aspirin  PVD HLD Hx of TIA Carotid artery disease - Continue home aspirin and rosuvastatin  CKD > Creatinine stable - Avoid nephrotoxic agents - Trend renal function and electrolytes  Anemia > Chronic and stable with hemoglobin of 10.5 - Trend CBC  GERD - Continue PPI   History of diabetes - Not currently on medication for this   DVT prophylaxis: Heparin  Code Status:   Full  Family Communication:  None on admission.  She states that her granddaughter and daughter have already been updated and her granddaughter had just left.  She states that they do not need an additional update. Disposition Plan:   Patient is from:  Home  Anticipated DC to:  Home  Anticipated DC date:  1 to 3 days  Anticipated DC barriers: None  Consults called:  Message sent to cardiology for consult in the morning  Admission status:  Observation, telemetry   Severity of Illness: The appropriate patient status for this patient is OBSERVATION. Observation status is judged to be reasonable and necessary in order to provide the required intensity of service to ensure the patient's safety. The patient's presenting symptoms, physical exam findings, and initial radiographic and laboratory data in the context of their medical condition is felt to place them at decreased risk for further clinical deterioration. Furthermore, it is anticipated that the patient will be medically stable for discharge from the hospital within 2 midnights of admission. The following factors support the patient status of observation.   " The patient's presenting symptoms  include chest pain, shortness of breath. " The physical exam findings include murmur. " The initial radiographic and laboratory data are Lab work-up showed CMP with BUN stable at 30, glucose 120, AST mildly elevated 43.  CBC showed hemoglobin stable 10.6.  PT, PTT, INR pending.  Troponin initially 15 and then repeat was elevated at 74.  Chest x-ray with no acute abnormalities.  Marcelyn Bruins MD [facility]  How to contact the [facility] Attending or Consulting provider [place] or covering provider during after hours [place], for this patient?   Check the care team in [facility] and look for a) attending/consulting [facility] provider listed and b) the [facility] team listed Log into [email or web address] and use [facility]'s universal password to access. If you do not have the password, please contact the hospital operator. Locate the [facility] provider you are looking for under [facility] and page to a number that you can be directly reached. If you still have difficulty reaching the provider, please page the [facility] (Director on Call) for the Hospitalists listed on amion  for assistance.  06/06/2021, 12:52 AM

## 2021-06-07 ENCOUNTER — Encounter: Payer: Self-pay | Admitting: Internal Medicine

## 2021-06-07 ENCOUNTER — Encounter
Admission: EM | Disposition: A | Discharge status: Home / Self Care | Payer: Self-pay | Source: Home / Self Care | Attending: Internal Medicine

## 2021-06-07 DIAGNOSIS — E785 Hyperlipidemia, unspecified: Secondary | ICD-10-CM | POA: Diagnosis present

## 2021-06-07 DIAGNOSIS — E1122 Type 2 diabetes mellitus with diabetic chronic kidney disease: Secondary | ICD-10-CM | POA: Diagnosis present

## 2021-06-07 DIAGNOSIS — I2582 Chronic total occlusion of coronary artery: Secondary | ICD-10-CM | POA: Diagnosis present

## 2021-06-07 DIAGNOSIS — Z8673 Personal history of transient ischemic attack (TIA), and cerebral infarction without residual deficits: Secondary | ICD-10-CM | POA: Diagnosis not present

## 2021-06-07 DIAGNOSIS — N182 Chronic kidney disease, stage 2 (mild): Secondary | ICD-10-CM | POA: Diagnosis present

## 2021-06-07 DIAGNOSIS — E7849 Other hyperlipidemia: Secondary | ICD-10-CM

## 2021-06-07 DIAGNOSIS — Z882 Allergy status to sulfonamides status: Secondary | ICD-10-CM | POA: Diagnosis not present

## 2021-06-07 DIAGNOSIS — Z886 Allergy status to analgesic agent status: Secondary | ICD-10-CM | POA: Diagnosis not present

## 2021-06-07 DIAGNOSIS — E1169 Type 2 diabetes mellitus with other specified complication: Secondary | ICD-10-CM | POA: Diagnosis not present

## 2021-06-07 DIAGNOSIS — K2100 Gastro-esophageal reflux disease with esophagitis, without bleeding: Secondary | ICD-10-CM | POA: Diagnosis present

## 2021-06-07 DIAGNOSIS — Z20822 Contact with and (suspected) exposure to covid-19: Secondary | ICD-10-CM | POA: Diagnosis present

## 2021-06-07 DIAGNOSIS — Z79899 Other long term (current) drug therapy: Secondary | ICD-10-CM | POA: Diagnosis not present

## 2021-06-07 DIAGNOSIS — E1151 Type 2 diabetes mellitus with diabetic peripheral angiopathy without gangrene: Secondary | ICD-10-CM | POA: Diagnosis present

## 2021-06-07 DIAGNOSIS — Z91040 Latex allergy status: Secondary | ICD-10-CM | POA: Diagnosis not present

## 2021-06-07 DIAGNOSIS — Z888 Allergy status to other drugs, medicaments and biological substances status: Secondary | ICD-10-CM | POA: Diagnosis not present

## 2021-06-07 DIAGNOSIS — I214 Non-ST elevation (NSTEMI) myocardial infarction: Secondary | ICD-10-CM | POA: Diagnosis present

## 2021-06-07 DIAGNOSIS — I25810 Atherosclerosis of coronary artery bypass graft(s) without angina pectoris: Secondary | ICD-10-CM | POA: Diagnosis present

## 2021-06-07 DIAGNOSIS — I2510 Atherosclerotic heart disease of native coronary artery without angina pectoris: Secondary | ICD-10-CM | POA: Diagnosis present

## 2021-06-07 DIAGNOSIS — E1165 Type 2 diabetes mellitus with hyperglycemia: Secondary | ICD-10-CM | POA: Diagnosis present

## 2021-06-07 DIAGNOSIS — I129 Hypertensive chronic kidney disease with stage 1 through stage 4 chronic kidney disease, or unspecified chronic kidney disease: Secondary | ICD-10-CM | POA: Diagnosis present

## 2021-06-07 DIAGNOSIS — Z87891 Personal history of nicotine dependence: Secondary | ICD-10-CM | POA: Diagnosis not present

## 2021-06-07 DIAGNOSIS — D649 Anemia, unspecified: Secondary | ICD-10-CM | POA: Diagnosis present

## 2021-06-07 DIAGNOSIS — E1140 Type 2 diabetes mellitus with diabetic neuropathy, unspecified: Secondary | ICD-10-CM | POA: Diagnosis present

## 2021-06-07 DIAGNOSIS — R079 Chest pain, unspecified: Secondary | ICD-10-CM | POA: Diagnosis not present

## 2021-06-07 HISTORY — PX: LEFT HEART CATH AND CORONARY ANGIOGRAPHY: CATH118249

## 2021-06-07 LAB — CBC
HCT: 31.2 % — ABNORMAL LOW (ref 36.0–46.0)
Hemoglobin: 10.6 g/dL — ABNORMAL LOW (ref 12.0–15.0)
MCH: 29.9 pg (ref 26.0–34.0)
MCHC: 34 g/dL (ref 30.0–36.0)
MCV: 88.1 fL (ref 80.0–100.0)
Platelets: 164 10*3/uL (ref 150–400)
RBC: 3.54 MIL/uL — ABNORMAL LOW (ref 3.87–5.11)
RDW: 13.5 % (ref 11.5–15.5)
WBC: 4.2 10*3/uL (ref 4.0–10.5)
nRBC: 0 % (ref 0.0–0.2)

## 2021-06-07 LAB — BASIC METABOLIC PANEL
Anion gap: 7 (ref 5–15)
BUN: 24 mg/dL — ABNORMAL HIGH (ref 8–23)
CO2: 26 mmol/L (ref 22–32)
Calcium: 9 mg/dL (ref 8.9–10.3)
Chloride: 108 mmol/L (ref 98–111)
Creatinine, Ser: 1.02 mg/dL — ABNORMAL HIGH (ref 0.44–1.00)
GFR, Estimated: 56 mL/min — ABNORMAL LOW (ref >60)
Glucose, Bld: 133 mg/dL — ABNORMAL HIGH (ref 70–99)
Potassium: 3.8 mmol/L (ref 3.5–5.1)
Sodium: 141 mmol/L (ref 135–145)

## 2021-06-07 LAB — HEPARIN LEVEL (UNFRACTIONATED): Heparin Unfractionated: 0.59 IU/mL (ref 0.30–0.70)

## 2021-06-07 SURGERY — LEFT HEART CATH AND CORONARY ANGIOGRAPHY
Anesthesia: Moderate Sedation

## 2021-06-07 MED ORDER — SODIUM CHLORIDE 0.9 % WEIGHT BASED INFUSION: 1.0000 mL/kg/h | INTRAVENOUS | Status: DC

## 2021-06-07 MED ORDER — LABETALOL HCL 5 MG/ML IV SOLN: 10.0000 mg | INTRAVENOUS | Status: AC | PRN

## 2021-06-07 MED ORDER — ONDANSETRON HCL 4 MG/2ML IJ SOLN: 4.0000 mg | Freq: Four times a day (QID) | INTRAMUSCULAR | Status: DC | PRN

## 2021-06-07 MED ORDER — SODIUM CHLORIDE 0.9 % IV SOLN: 250.0000 mL | INTRAVENOUS | Status: DC | PRN

## 2021-06-07 MED ORDER — ISOSORBIDE MONONITRATE ER 60 MG PO TB24
60.0000 mg | ORAL_TABLET | Freq: Every day | ORAL | Status: DC
  Administered 2021-06-07 – 2021-06-08 (×2): 60 mg via ORAL
  Filled 2021-06-07 (×2): qty 1

## 2021-06-07 MED ORDER — SODIUM CHLORIDE 0.9% FLUSH: 3.0000 mL | INTRAVENOUS | Status: DC | PRN

## 2021-06-07 MED ORDER — IOHEXOL 300 MG/ML  SOLN
INTRAMUSCULAR | Status: DC | PRN
  Administered 2021-06-07: 128 mL

## 2021-06-07 MED ORDER — HYDRALAZINE HCL 20 MG/ML IJ SOLN: 10.0000 mg | INTRAMUSCULAR | Status: AC | PRN

## 2021-06-07 MED ORDER — ACETAMINOPHEN 325 MG PO TABS: 650.0000 mg | ORAL_TABLET | ORAL | Status: DC | PRN

## 2021-06-07 MED ORDER — MIDAZOLAM HCL 2 MG/2ML IJ SOLN
INTRAMUSCULAR | Status: DC | PRN
  Administered 2021-06-07: 1 mg via INTRAVENOUS

## 2021-06-07 MED ORDER — ASPIRIN 81 MG PO CHEW
81.0000 mg | CHEWABLE_TABLET | ORAL | Status: DC
Start: 2021-06-08 — End: 2021-06-07

## 2021-06-07 MED ORDER — SIMETHICONE 80 MG PO CHEW
80.0000 mg | CHEWABLE_TABLET | Freq: Four times a day (QID) | ORAL | Status: DC | PRN
  Administered 2021-06-07: 80 mg via ORAL
  Filled 2021-06-07 (×3): qty 1

## 2021-06-07 MED ORDER — LIDOCAINE HCL (PF) 1 % IJ SOLN
INTRAMUSCULAR | Status: DC | PRN
  Administered 2021-06-07: 20 mL

## 2021-06-07 MED ORDER — FENTANYL CITRATE (PF) 100 MCG/2ML IJ SOLN
INTRAMUSCULAR | Status: DC | PRN
  Administered 2021-06-07 (×2): 25 ug via INTRAVENOUS

## 2021-06-07 MED ORDER — FENTANYL CITRATE (PF) 100 MCG/2ML IJ SOLN
INTRAMUSCULAR | Status: AC
  Filled 2021-06-07: qty 2

## 2021-06-07 MED ORDER — LIDOCAINE HCL (PF) 1 % IJ SOLN
INTRAMUSCULAR | Status: AC
  Filled 2021-06-07: qty 30

## 2021-06-07 MED ORDER — HEPARIN (PORCINE) IN NACL 1000-0.9 UT/500ML-% IV SOLN
INTRAVENOUS | Status: DC | PRN
Start: 2021-06-07 — End: 2021-06-07
  Administered 2021-06-07 (×2): 500 mL

## 2021-06-07 MED ORDER — SODIUM CHLORIDE 0.9 % WEIGHT BASED INFUSION: 1.0000 mL/kg/h | INTRAVENOUS | Status: AC

## 2021-06-07 MED ORDER — SODIUM CHLORIDE 0.9 % WEIGHT BASED INFUSION
3.0000 mL/kg/h | INTRAVENOUS | Status: AC
  Administered 2021-06-07: 3 mL/kg/h via INTRAVENOUS

## 2021-06-07 MED ORDER — MIDAZOLAM HCL 2 MG/2ML IJ SOLN
INTRAMUSCULAR | Status: AC
  Filled 2021-06-07: qty 2

## 2021-06-07 MED ORDER — SODIUM CHLORIDE 0.9% FLUSH
3.0000 mL | Freq: Two times a day (BID) | INTRAVENOUS | Status: DC
  Administered 2021-06-07 – 2021-06-08 (×2): 3 mL via INTRAVENOUS

## 2021-06-07 MED ORDER — HEPARIN (PORCINE) IN NACL 1000-0.9 UT/500ML-% IV SOLN
INTRAVENOUS | Status: AC
  Filled 2021-06-07: qty 1000

## 2021-06-07 MED ORDER — METOPROLOL SUCCINATE ER 50 MG PO TB24
25.0000 mg | ORAL_TABLET | Freq: Every day | ORAL | Status: DC
  Administered 2021-06-07 – 2021-06-08 (×2): 25 mg via ORAL
  Filled 2021-06-07 (×2): qty 1

## 2021-06-07 MED ORDER — ASPIRIN EC 81 MG PO TBEC
DELAYED_RELEASE_TABLET | ORAL | Status: AC
  Filled 2021-06-07: qty 1

## 2021-06-07 SURGICAL SUPPLY — 21 items
CATH INFINITI 5 FR IM (CATHETERS) ×2 IMPLANT
CATH INFINITI 5FR ANG PIGTAIL (CATHETERS) ×2 IMPLANT
CATH INFINITI 5FR JL4 (CATHETERS) ×2 IMPLANT
CATH INFINITI JR4 5F (CATHETERS) ×2 IMPLANT
DEVICE CLOSURE MYNXGRIP 5F (Vascular Products) ×2 IMPLANT
DILATOR VESSEL 38 20CM 12FR (INTRODUCER) IMPLANT
DILATOR VESSEL 38 20CM 14FR (INTRODUCER) IMPLANT
DILATOR VESSEL 38 20CM 18FR (INTRODUCER) IMPLANT
DILATOR VESSEL 38 20CM 8FR (INTRODUCER) IMPLANT
KIT SYRINGE INJ CVI SPIKEX1 (MISCELLANEOUS) ×2 IMPLANT
NEEDLE PERC 18GX7CM (NEEDLE) ×2 IMPLANT
PACK CARDIAC CATH (CUSTOM PROCEDURE TRAY) ×2 IMPLANT
PROTECTION STATION PRESSURIZED (MISCELLANEOUS) ×2
SET ATX SIMPLICITY (MISCELLANEOUS) ×2 IMPLANT
SHEATH AVANTI 5FR X 11CM (SHEATH) ×2 IMPLANT
SHEATH AVANTI 7FRX11 (SHEATH) IMPLANT
STATION PROTECTION PRESSURIZED (MISCELLANEOUS) ×1 IMPLANT
WIRE AMPLATZ SS-J .035X180CM (WIRE) IMPLANT
WIRE EMERALD 3MM-J .035X260CM (WIRE) ×2 IMPLANT
WIRE GUIDERIGHT .035X150 (WIRE) ×2 IMPLANT
WIRE HITORQ VERSACORE ST 145CM (WIRE) ×2 IMPLANT

## 2021-06-07 NOTE — ED Notes (Signed)
Patient is resting quietly, chest rise and fall observed. Heparin drip infusing without issue. No acute distress noted.

## 2021-06-07 NOTE — ED Notes (Signed)
Patient continues to rest comfortably. NSR noted on monitor. Heparin drip infusing without difficulty. No needs at this time.

## 2021-06-07 NOTE — ED Notes (Signed)
Informed Rn bed assigned  

## 2021-06-07 NOTE — Progress Notes (Signed)
Dr. Saralyn Pilar in at bedside to speak with pt. And her daughter Sharyn Lull re: cath results. Both verbalize understanding of conversation at present.

## 2021-06-07 NOTE — Progress Notes (Signed)
ANTICOAGULATION CONSULT NOTE   Pharmacy Consult for heparin infusion Indication: ACS/nSTEMI  Allergies  Allergen Reactions   Telmisartan Other (See Comments)    Acute renal failure   Niacin And Related Hives   Sertraline Hcl Other (See Comments)    Hallucinations,     Sulfa Drugs Cross Reactors Nausea Only   Tetracyclines & Related Hives   Latex Rash    RAST testing was NEGATIVE  for LATEX   Simvastatin Rash    *Antihyperlipidemics*; elevated LFT's.     Patient Measurements: Height: '5\' 3"'$  (160 cm) Weight: 72.3 kg (159 lb 6.4 oz) IBW/kg (Calculated) : 52.4 Heparin Dosing Weight: 67.5 kg  Vital Signs: BP: 157/67 (08/02 0622) Pulse Rate: 69 (08/02 0622)  Labs: Recent Labs    06/05/21 2134 06/05/21 2343 06/06/21 0204 06/06/21 0519 06/06/21 1642 06/07/21 0622  HGB 10.6*  --   --  10.5*  --  10.6*  HCT 30.8*  --   --  30.9*  --  31.2*  PLT 173  --   --  171  --  164  APTT  --   --   --  144*  --   --   LABPROT  --   --   --  13.8  --   --   INR  --   --   --  1.1  --   --   HEPARINUNFRC  --   --   --  0.52 0.24* 0.59  CREATININE 0.99  --   --  0.87  --  1.02*  TROPONINIHS 15 74* 125* 108*  --   --      Estimated Creatinine Clearance: 42.6 mL/min (A) (by C-G formula based on SCr of 1.02 mg/dL (H)).   Medical History: Past Medical History:  Diagnosis Date   Abdominal aortic aneurysm without mention of rupture    infrarenal, stable, folllowed by Leotis Pain   Acute posthemorrhagic anemia    Arthritis    B12 deficiency    CAD (coronary artery disease), autologous vein bypass graft    Cardiac dysrhythmia, unspecified    Colon polyp    Depression    Diverticulitis of colon    Dizziness    DUE TO MEDICINES   Dysrhythmia    GERD (gastroesophageal reflux disease)    HOH (hard of hearing)    Hyperlipidemia    Hypertension    pt denies. placed on meds after CABG   IBS (irritable bowel syndrome)    Neuropathy    Neuropathy 07/01/2013   Peripheral vascular  disease (HCC)    s/p CEA    Reflux esophagitis    Rheumatic fever    possible at age 34   Sliding hiatal hernia    Tobacco abuse    Tobacco abuse     Assessment: Pt is 80 yo female w/ hx of CAD, CABG, HTN, PVD presenting with c/o of sudden onset chest pain & SOB.  Pt found with Troponin I level trending up from 15 to 75.  8/1 0519 HL 0.52 therapeutic x 1 @ 850 units/hr 8/1 1642 HL 0.24   Goal of Therapy:  Heparin level 0.3-0.7 units/ml Monitor platelets by anticoagulation protocol: Yes   Plan:  8/2:  HL @ 0622 = 0.59, therapeutic X 1  Will continue this pt on current rate and recheck HL in 8 hrs.  Julita Ozbun D PharmD 06/07/2021 7:10 AM

## 2021-06-07 NOTE — ED Notes (Signed)
Patient amb to the restroom, steady gait noted.

## 2021-06-07 NOTE — Progress Notes (Signed)
PROGRESS NOTE   HPI was taken from Dr. Trilby Drummer:  Sabrina Montoya is a 80 y.o. female with medical history significant of AAA, anemia, arthritis, carotid artery disease, CAD status post CABG, PVD, CKD, diabetes, GERD, hyperlipidemia, IBS, insomnia, TIA, syncope who presents following episode of chest pain with shortness of breath. As above patient had an episode of chest pain with some associated shortness of breath earlier today.  This occurred when she was walking out of a movie.  Pain was described as an 8 out of 10 chest tightness/pressure. She states that she has been having some intermittent chest pain on exertion for the past month or so but it was not as severe as today's episode.  The pain today did improve after receiving aspirin and nitro.  She is currently chest pain-free. She denies fevers, chills, abdominal pain, constipation, diarrhea, nausea, vomiting.    ED Course: Vital signs in the ED significant for blood pressure in the 0000000 to [id number] systolic.  Lab work-up showed CMP with BUN stable at 30, glucose 120, AST mildly elevated 43.  CBC showed hemoglobin stable 10.6.  PT, PTT, INR pending.  Troponin initially 15 and then repeat was elevated at 74.  Chest x-ray with no acute abnormalities.  Patient started on heparin drip in the ED.    LIVI AIME  [id number] DOB: [birth year] DOA: 06/05/2021 PCP: Crecencio Mc, MD   Assessment & Plan:   Principal Problem:   NSTEMI (non-ST elevated myocardial infarction) [facility]) Active Problems:   Hyperlipidemia   CAD (coronary artery disease), autologous vein bypass graft   Peripheral vascular disease (HCC)   Chronic reflux esophagitis   TIA (transient ischemic attack)   Anemia   Controlled type 2 diabetes with neuropathy ([place])   NSTEMI: CAD s/p CABG. Nitro, morphine prn. D/c IV heparin as per cardio. Echo showed EF [id number], grade I diastolic dysfunction. S/p cardiac cath today, for results see cardio notes as the results are not posted yet.  Continue on metoprolol, statin, aspirin & imdur. Continue on tele. Cardio following recs and apprec   PVD: continue aspirin, statin   HLD: continue on statin   Hx of TIA: continue on aspirin, statin    CKD: unknown baseline Cr/GFR, currently II. Cr is stable   ACD: likely secondary to CKD. H&H are stable    GERD: continue on PPI   DM2: HbA1c was 7.3, approx 1 month ago, poorly controlled. Not currently on meds for this. Likely diet controlled    DVT prophylaxis: IV heparin  Code Status:  full  Family Communication: discussed pt's care w/ pt's family at bedside and answered their questions Disposition Plan: likely d/c back home   Level of care: Med-Surg  Status is: Observation  The patient remains OBS appropriate and will d/c before 2 midnights.  Dispo: The patient is from: Home              Anticipated d/c is to: Home              Patient currently is not medically stable to d/c.   Difficult to place patient : unclear    Consultants:  cardio  Procedures:   Antimicrobials:   Subjective: Pt c/o back pain & abd pain after cardiac cath today   Objective: Vitals:   06/07/21 0256 06/07/21 0622 06/07/21 0804 06/07/21 0825  BP: 123/64 (!) 157/67 (!) 165/70   Pulse: 73 69 71   Resp: '17 14 16   '$ Temp:   97.8  F (36.6 C)   TempSrc:   Oral   SpO2: 98% 96% 100% 100%  Weight:   72.3 kg   Height:   '5\' 3"'$  (1.6 m)    No intake or output data in the 24 hours ending 06/07/21 0833 Filed Weights   06/05/21 2138 06/07/21 0804  Weight: 72.3 kg 72.3 kg    Examination:  General exam: Appears uncomfortable Respiratory system: Clear breath sound b/l  Cardiovascular system: S1/S2+. No rubs or clicks Gastrointestinal system: Abd is soft, NT, ND & hypoactive bowel sounds  Central nervous system: Alert and oriented. Moves all extremities  Psychiatry: Judgement and insight appear normal. Flat mood and affect    Data Reviewed: I have personally reviewed following labs and  imaging studies  CBC: Recent Labs  Lab 06/05/21 2134 06/06/21 0519 06/07/21 0622  WBC 5.5 5.0 4.2  HGB 10.6* 10.5* 10.6*  HCT 30.8* 30.9* 31.2*  MCV 87.0 87.0 88.1  PLT 173 171 [id number]   Basic Metabolic Panel: Recent Labs  Lab 06/05/21 2134 06/06/21 0519 06/07/21 0622  NA 138 144 141  K 3.7 3.6 3.8  CL 108 109 108  CO2 '23 27 26  '$ GLUCOSE 120* 127* 133*  BUN 30* 27* 24*  CREATININE 0.99 0.87 1.02*  CALCIUM 9.4 9.5 9.0   GFR: Estimated Creatinine Clearance: 42.6 mL/min (A) (by C-G formula based on SCr of 1.02 mg/dL (H)). Liver Function Tests: Recent Labs  Lab 06/05/21 2134  AST 43*  ALT 21  ALKPHOS 61  BILITOT 0.7  PROT 7.1  ALBUMIN 4.4   No results for input(s): LIPASE, AMYLASE in the last 168 hours. No results for input(s): AMMONIA in the last 168 hours. Coagulation Profile: Recent Labs  Lab 06/06/21 0519  INR 1.1   Cardiac Enzymes: No results for input(s): CKTOTAL, CKMB, CKMBINDEX, TROPONINI in the last 168 hours. BNP (last 3 results) No results for input(s): PROBNP in the last 8760 hours. HbA1C: Recent Labs    06/06/21 0519  HGBA1C 7.0*   CBG: No results for input(s): GLUCAP in the last 168 hours. Lipid Profile: Recent Labs    06/06/21 0519  CHOL 116  HDL 44  LDLCALC 48  TRIG 122  CHOLHDL 2.6   Thyroid Function Tests: No results for input(s): TSH, T4TOTAL, FREET4, T3FREE, THYROIDAB in the last 72 hours. Anemia Panel: No results for input(s): VITAMINB12, FOLATE, FERRITIN, TIBC, IRON, RETICCTPCT in the last 72 hours. Sepsis Labs: No results for input(s): PROCALCITON, LATICACIDVEN in the last 168 hours.  Recent Results (from the past 240 hour(s))  Resp Panel by RT-PCR (Flu A&B, Covid) Nasopharyngeal Swab     Status: None   Collection Time: 06/06/21 12:40 AM   Specimen: Nasopharyngeal Swab; Nasopharyngeal(NP) swabs in vial transport medium  Result Value Ref Range Status   SARS Coronavirus 2 by RT PCR NEGATIVE NEGATIVE Final    Comment:  (NOTE) SARS-CoV-2 target nucleic acids are NOT DETECTED.  The SARS-CoV-2 RNA is generally detectable in upper respiratory specimens during the acute phase of infection. The lowest concentration of SARS-CoV-2 viral copies this assay can detect is 138 copies/mL. A negative result does not preclude SARS-Cov-2 infection and should not be used as the sole basis for treatment or other patient management decisions. A negative result may occur with  improper specimen collection/handling, submission of specimen other than nasopharyngeal swab, presence of viral mutation(s) within the areas targeted by this assay, and inadequate number of viral copies(<138 copies/mL). A negative result must be combined with clinical  observations, patient history, and epidemiological information. The expected result is Negative.  Fact Sheet for Patients:  EntrepreneurPulse.com.au  Fact Sheet for Healthcare Providers:  IncredibleEmployment.be  This test is no t yet approved or cleared by the [place] FDA and  has been authorized for detection and/or diagnosis of SARS-CoV-2 by FDA under an Emergency Use Authorization (EUA). This EUA will remain  in effect (meaning this test can be used) for the duration of the COVID-19 declaration under Section 564(b)(1) of the Act, 21 U.S.C.section 360bbb-3(b)(1), unless the authorization is terminated  or revoked sooner.       Influenza A by PCR NEGATIVE NEGATIVE Final   Influenza B by PCR NEGATIVE NEGATIVE Final    Comment: (NOTE) The Xpert Xpress SARS-CoV-2/FLU/RSV plus assay is intended as an aid in the diagnosis of influenza from Nasopharyngeal swab specimens and should not be used as a sole basis for treatment. Nasal washings and aspirates are unacceptable for Xpert Xpress SARS-CoV-2/FLU/RSV testing.  Fact Sheet for Patients: EntrepreneurPulse.com.au  Fact Sheet for Healthcare  Providers: IncredibleEmployment.be  This test is not yet approved or cleared by the [place] FDA and has been authorized for detection and/or diagnosis of SARS-CoV-2 by FDA under an Emergency Use Authorization (EUA). This EUA will remain in effect (meaning this test can be used) for the duration of the COVID-19 declaration under Section 564(b)(1) of the Act, 21 U.S.C. section 360bbb-3(b)(1), unless the authorization is terminated or revoked.  Performed at [facility], [address], [place], [place] [address]          Radiology Studies: [facility] [place] 1 View  Result Date: 06/05/2021 CLINICAL DATA:  Acute onset of chest pain and shortness of breath today. Coronary artery disease. EXAM: PORTABLE CHEST 1 VIEW COMPARISON:  09/16/2019 FINDINGS: The heart size and mediastinal contours are within normal limits. Prior CABG again noted. Aortic atherosclerotic calcification noted. Both lungs are clear. Cervical spine fusion hardware again noted. IMPRESSION: No active disease. Electronically Signed   By: Marlaine Hind M.D.   On: 06/05/2021 22:11   ECHOCARDIOGRAM COMPLETE  Result Date: 06/06/2021    ECHOCARDIOGRAM REPORT   Patient Name:   ESHIKA RIGALI Date of Exam: 06/06/2021 Medical Rec #:  [id number]  Height:       63.0 in Accession #:    [id number] Weight:       159.4 lb Date of Birth:  [birth year]  BSA:          1.756 m Patient Age:    89 years   BP:           124/51 mmHg Patient Gender: F          HR:           59 bpm. Exam Location:  [facility] Procedure: 2D Echo, Color Doppler, Cardiac Doppler and Intracardiac            Opacification Agent Indications:     R07.9 Chest Pain  History:         Patient has no prior history of Echocardiogram examinations.                  CAD, Prior CABG, CKD; Risk Factors:Hypertension, Dyslipidemia                  and Current Smoker.  Sonographer:     Charmayne Sheer RDCS (AE) Referring Phys:  [id number] Candace Gallus MELVIN Diagnosing Phys: Isaias Cowman MD  Sonographer Comments: Suboptimal apical window and  suboptimal subcostal window. IMPRESSIONS  1. Left ventricular ejection fraction, by estimation, is 60 to 65%. The left ventricle has normal function. Left ventricular endocardial border not optimally defined to evaluate regional wall motion. Left ventricular diastolic parameters are consistent with Grade I diastolic dysfunction (impaired relaxation).  2. Right ventricular systolic function is normal. The right ventricular size is normal.  3. The mitral valve is normal in structure. Mild mitral valve regurgitation. No evidence of mitral stenosis.  4. The aortic valve is normal in structure. Aortic valve regurgitation is trivial. No aortic stenosis is present.  5. The inferior vena cava is normal in size with greater than 50% respiratory variability, suggesting right atrial pressure of 3 mmHg. FINDINGS  Left Ventricle: Left ventricular ejection fraction, by estimation, is 60 to 65%. The left ventricle has normal function. Left ventricular endocardial border not optimally defined to evaluate regional wall motion. Definity contrast agent was given IV to delineate the left ventricular endocardial borders. The left ventricular internal cavity size was normal in size. There is no left ventricular hypertrophy. Left ventricular diastolic parameters are consistent with Grade I diastolic dysfunction (impaired relaxation). Right Ventricle: The right ventricular size is normal. No increase in right ventricular wall thickness. Right ventricular systolic function is normal. Left Atrium: Left atrial size was normal in size. Right Atrium: Right atrial size was normal in size. Pericardium: There is no evidence of pericardial effusion. Mitral Valve: The mitral valve is normal in structure. Mild mitral valve regurgitation. No evidence of mitral valve stenosis. MV peak gradient, 5.3 mmHg. The mean mitral valve gradient is 2.0 mmHg. Tricuspid Valve: The tricuspid valve is  normal in structure. Tricuspid valve regurgitation is mild . No evidence of tricuspid stenosis. Aortic Valve: The aortic valve is normal in structure. Aortic valve regurgitation is trivial. No aortic stenosis is present. Aortic valve mean gradient measures 6.0 mmHg. Aortic valve peak gradient measures 10.8 mmHg. Aortic valve area, by VTI measures 1.44 cm. Pulmonic Valve: The pulmonic valve was normal in structure. Pulmonic valve regurgitation is not visualized. No evidence of pulmonic stenosis. Aorta: The aortic root is normal in size and structure. Venous: The inferior vena cava is normal in size with greater than 50% respiratory variability, suggesting right atrial pressure of 3 mmHg. IAS/Shunts: No atrial level shunt detected by color flow Doppler.  LEFT VENTRICLE PLAX 2D LVIDd:         4.10 cm  Diastology LVIDs:         2.50 cm  LV e' medial:    6.20 cm/s LV PW:         0.90 cm  LV E/e' medial:  13.3 LV IVS:        0.80 cm  LV e' lateral:   7.83 cm/s LVOT diam:     1.90 cm  LV E/e' lateral: 10.5 LV SV:         49 LV SV Index:   28 LVOT Area:     2.84 cm  RIGHT VENTRICLE RV Basal diam:  2.70 cm LEFT ATRIUM           Index       RIGHT ATRIUM          Index LA diam:      3.20 cm 1.82 cm/m  RA Area:     8.19 cm LA Vol (A4C): 22.1 ml 12.59 ml/m RA Volume:   15.20 ml 8.66 ml/m  AORTIC VALVE  PULMONIC VALVE AV Area (Vmax):    1.60 cm     PV Vmax:       1.01 m/s AV Area (Vmean):   1.66 cm     PV Vmean:      70.600 cm/s AV Area (VTI):     1.44 cm     PV VTI:        0.208 m AV Vmax:           164.00 cm/s  PV Peak grad:  4.1 mmHg AV Vmean:          118.000 cm/s PV Mean grad:  2.0 mmHg AV VTI:            0.340 m AV Peak Grad:      10.8 mmHg AV Mean Grad:      6.0 mmHg LVOT Vmax:         92.70 cm/s LVOT Vmean:        69.200 cm/s LVOT VTI:          0.173 m LVOT/AV VTI ratio: 0.51  AORTA Ao Root diam: 3.00 cm MITRAL VALVE MV Area (PHT): 2.65 cm     SHUNTS MV Area VTI:   1.46 cm     Systemic VTI:   0.17 m MV Peak grad:  5.3 mmHg     Systemic Diam: 1.90 cm MV Mean grad:  2.0 mmHg MV Vmax:       1.15 m/s MV Vmean:      67.3 cm/s MV Decel Time: 286 msec MV E velocity: 82.30 cm/s MV A velocity: 101.00 cm/s MV E/A ratio:  0.81 Isaias Cowman MD Electronically signed by Isaias Cowman MD Signature Date/Time: 06/06/2021/1:46:41 PM    Final         Scheduled Meds:  aspirin EC       [START ON 06/08/2021] aspirin  81 mg Oral Pre-Cath   [MAR Hold] aspirin EC  81 mg Oral Daily   [MAR Hold] isosorbide mononitrate  30 mg Oral Daily   [MAR Hold] multivitamin-lutein   Oral BID   [MAR Hold] pantoprazole  40 mg Oral Daily   [MAR Hold] rosuvastatin  40 mg Oral Daily   [MAR Hold] sodium chloride flush  3 mL Intravenous Q12H   Continuous Infusions:  sodium chloride     sodium chloride 3 mL/kg/hr (06/07/21 0811)   Followed by   sodium chloride     heparin 1,000 Units/hr (06/07/21 0603)     LOS: 0 days    Time spent: 33 mins     Wyvonnia Dusky, MD [facility] Pager [phone]  If 7PM-7AM, please contact night-coverage 06/07/2021, 8:33 AM

## 2021-06-08 DIAGNOSIS — I214 Non-ST elevation (NSTEMI) myocardial infarction: Secondary | ICD-10-CM | POA: Diagnosis not present

## 2021-06-08 LAB — CBC
HCT: 29.1 % — ABNORMAL LOW (ref 36.0–46.0)
Hemoglobin: 10 g/dL — ABNORMAL LOW (ref 12.0–15.0)
MCH: 30.1 pg (ref 26.0–34.0)
MCHC: 34.4 g/dL (ref 30.0–36.0)
MCV: 87.7 fL (ref 80.0–100.0)
Platelets: 160 10*3/uL (ref 150–400)
RBC: 3.32 MIL/uL — ABNORMAL LOW (ref 3.87–5.11)
RDW: 13.6 % (ref 11.5–15.5)
WBC: 5.2 10*3/uL (ref 4.0–10.5)
nRBC: 0 % (ref 0.0–0.2)

## 2021-06-08 LAB — BASIC METABOLIC PANEL
Anion gap: 4 — ABNORMAL LOW (ref 5–15)
BUN: 30 mg/dL — ABNORMAL HIGH (ref 8–23)
CO2: 25 mmol/L (ref 22–32)
Calcium: 9 mg/dL (ref 8.9–10.3)
Chloride: 110 mmol/L (ref 98–111)
Creatinine, Ser: 1.07 mg/dL — ABNORMAL HIGH (ref 0.44–1.00)
GFR, Estimated: 53 mL/min — ABNORMAL LOW (ref >60)
Glucose, Bld: 127 mg/dL — ABNORMAL HIGH (ref 70–99)
Potassium: 3.9 mmol/L (ref 3.5–5.1)
Sodium: 139 mmol/L (ref 135–145)

## 2021-06-08 MED ORDER — ISOSORBIDE MONONITRATE ER 60 MG PO TB24: 60.0000 mg | ORAL_TABLET | Freq: Every day | ORAL | 0 refills | Status: DC

## 2021-06-08 MED ORDER — METOPROLOL SUCCINATE ER 25 MG PO TB24: 25.0000 mg | ORAL_TABLET | Freq: Every day | ORAL | 0 refills | Status: DC

## 2021-06-08 NOTE — Progress Notes (Signed)
Pt ready for discharge and agrees. Discharge instructions explained/pt verbalized understanding./ IV and tele removed. Will transport off unit via wheelchair.

## 2021-06-08 NOTE — Discharge Summary (Signed)
Sabrina Montoya [id number] DOB: 07/19/1941 DOA: 06/05/2021  PCP: Crecencio Mc, MD  Admit date: 06/05/2021 Discharge date: 06/08/2021  Admitted From: home Disposition:  home  Recommendations for Outpatient Follow-up:  Follow up with PCP in 1 week Please obtain BMP/CBC in one week Please follow up cardiology in one week     Discharge Condition:Stable CODE STATUS:full Diet recommendation: Heart Healthy  Brief/Interim Summary: Per HPI: Sabrina Montoya is a 80 y.o. female with medical history significant of AAA, anemia, arthritis, carotid artery disease, CAD status post CABG, PVD, CKD, diabetes, GERD, hyperlipidemia, IBS, insomnia, TIA, syncope who presents following episode of chest pain with shortness of breath.   NSTEMI H/xo CAD/CABG: S./p cardiac cath on 06/07/2021 Medical management was recommended.  Echo showed EF [id number], grade I diastolic dysfunction. Imdur and beta blocker dose was increased .    PVD: continue aspirin, statin   HLD: continue on statin   Hx of TIA: continue on aspirin, statin   CKD stage II. Cr is stable   ACD: likely secondary to CKD. H&H are stable F/u with pcp for monitoring   GERD: continue on PPI  DM2: HbA1c was 7.3, approx 1 month ago, poorly controlled. Not currently on meds for this. Likely diet controlled F/u with pcp for further mangement   Discharge Diagnoses:  Principal Problem:   NSTEMI (non-ST elevated myocardial infarction) ([place]) Active Problems:   Hyperlipidemia   CAD (coronary artery disease), autologous vein bypass graft   Peripheral vascular disease (HCC)   Chronic reflux esophagitis   TIA (transient ischemic attack)   Anemia   Controlled type 2 diabetes with neuropathy [facility])    Discharge Instructions  Discharge Instructions     AMB Referral to Cardiac Rehabilitation - Phase II   Complete by: As directed    Diagnosis: NSTEMI   After initial evaluation and assessments completed: Virtual Based Care may be provided alone  or in conjunction with Phase 2 Cardiac Rehab based on patient barriers.: Yes   Diet - low sodium heart healthy   Complete by: As directed    Discharge instructions   Complete by: As directed    F/u with primary cardiologist in one week   Increase activity slowly   Complete by: As directed       Allergies as of 06/08/2021       Reactions   Telmisartan Other (See Comments)   Acute renal failure   Niacin And Related Hives   Sertraline Hcl Other (See Comments)   Hallucinations,     Sulfa Drugs Cross Reactors Nausea Only   Tetracyclines & Related Hives   Latex Rash   RAST testing was NEGATIVE  for LATEX   Simvastatin Rash   *Antihyperlipidemics*; elevated LFT's.         Medication List     TAKE these medications    aspirin 81 MG tablet Take 81 mg by mouth daily.   cholecalciferol 1000 units tablet Commonly known as: VITAMIN D Take 1,000 Units by mouth daily.   conjugated estrogens vaginal cream Commonly known as: PREMARIN Place vaginally as needed.   isosorbide mononitrate 60 MG 24 hr tablet Commonly known as: IMDUR Take 1 tablet (60 mg total) by mouth daily. What changed:  medication strength how much to take   methocarbamol 500 MG tablet Commonly known as: ROBAXIN Take 500 mg by mouth at bedtime.   metoprolol succinate 25 MG 24 hr tablet Commonly known as: TOPROL-XL Take 1 tablet (25 mg total) by mouth daily.  Take with or immediately following a meal.   omeprazole 20 MG capsule Commonly known as: PRILOSEC TAKE 1 CAPSULE(20 MG) BY MOUTH DAILY   PRESERVISION AREDS 2 PO Take by mouth 2 (two) times daily.   QC TUMERIC COMPLEX PO Take 1 mg by mouth daily.   rosuvastatin 40 MG tablet Commonly known as: CRESTOR Take 1 tablet (40 mg total) by mouth daily.   VITAMIN B12 PO Take 1 Dose by mouth daily. GUMMY CHEWS; 3,000 mg        Follow-up Information     Vergia Alcon, MD. Go on 06/10/2021.   Specialty: Cardiology Why: @ 10am Patient will see  the PA. Contact information: [place] [address] [phone]                Allergies  Allergen Reactions   Telmisartan Other (See Comments)    Acute renal failure   Niacin And Related Hives   Sertraline Hcl Other (See Comments)    Hallucinations,     Sulfa Drugs Cross Reactors Nausea Only   Tetracyclines & Related Hives   Latex Rash    RAST testing was NEGATIVE  for LATEX   Simvastatin Rash    *Antihyperlipidemics*; elevated LFT's.     Consultations: Cardiology   Procedures/Studies: CARDIAC CATHETERIZATION  Result Date: 06/07/2021 Formatting of this result is different from the original.   Mid RCA lesion is 100% stenosed.   Prox Cx to Mid Cx lesion is 100% stenosed.   Ost Cx to Prox Cx lesion is 95% stenosed.   Ost LM lesion is 30% stenosed.   Ost LAD lesion is 100% stenosed.   Origin lesion is 100% stenosed.   The left ventricular systolic function is normal. 1.  Non-ST elevation myocardial infarction 2.  Severe three-vessel coronary artery disease with chronically occluded ostial LAD, chronically occluded left circumflex, moderate to heavily calcified 95% stenosis proximal left circumflex, chronically occluded nondominant mid RCA 3.  Patent LIMA to distal LAD, patent SVG to left PDA with retrograde flow to proximal/mid left circumflex and OM1, chronically occluded SVG to OM1 4.  Preserved left ventricular function, with LVEF greater than 55% Recommendations 1.  Medical therapy 2.  Uptitrate isosorbide mononitrate to 60 mg daily 3.  Add metoprolol succinate 25 mg daily 4.  May discharge home later today 5.  Follow-up with Dr. Harle Stanford [facility]  DG Chest Port 1 View  Result Date: 06/05/2021 CLINICAL DATA:  Acute onset of chest pain and shortness of breath today. Coronary artery disease. EXAM: PORTABLE CHEST 1 VIEW COMPARISON:  09/16/2019 FINDINGS: The heart size and mediastinal contours are within normal limits. Prior CABG  again noted. Aortic atherosclerotic calcification noted. Both lungs are clear. Cervical spine fusion hardware again noted. IMPRESSION: No active disease. Electronically Signed   By: Marlaine Hind M.D.   On: 06/05/2021 22:11   ECHOCARDIOGRAM COMPLETE  Result Date: 06/06/2021    ECHOCARDIOGRAM REPORT   Patient Name:   VONETTA BOLE Date of Exam: 06/06/2021 Medical Rec #:  [id number]  Height:       63.0 in Accession #:    [id number] Weight:       159.4 lb Date of Birth:  [birth year]  BSA:          1.756 m Patient Age:    39 years   BP:           124/51 mmHg Patient Gender: F  HR:           59 bpm. Exam Location:  [facility] Procedure: 2D Echo, Color Doppler, Cardiac Doppler and Intracardiac            Opacification Agent Indications:     R07.9 Chest Pain  History:         Patient has no prior history of Echocardiogram examinations.                  CAD, Prior CABG, CKD; Risk Factors:Hypertension, Dyslipidemia                  and Current Smoker.  Sonographer:     Charmayne Sheer RDCS (AE) Referring Phys:  [id number] Candace Gallus MELVIN Diagnosing Phys: Isaias Cowman MD  Sonographer Comments: Suboptimal apical window and suboptimal subcostal window. IMPRESSIONS  1. Left ventricular ejection fraction, by estimation, is 60 to 65%. The left ventricle has normal function. Left ventricular endocardial border not optimally defined to evaluate regional wall motion. Left ventricular diastolic parameters are consistent with Grade I diastolic dysfunction (impaired relaxation).  2. Right ventricular systolic function is normal. The right ventricular size is normal.  3. The mitral valve is normal in structure. Mild mitral valve regurgitation. No evidence of mitral stenosis.  4. The aortic valve is normal in structure. Aortic valve regurgitation is trivial. No aortic stenosis is present.  5. The inferior vena cava is normal in size with greater than 50% respiratory variability, suggesting right atrial pressure of 3 mmHg. FINDINGS  Left  Ventricle: Left ventricular ejection fraction, by estimation, is 60 to 65%. The left ventricle has normal function. Left ventricular endocardial border not optimally defined to evaluate regional wall motion. Definity contrast agent was given IV to delineate the left ventricular endocardial borders. The left ventricular internal cavity size was normal in size. There is no left ventricular hypertrophy. Left ventricular diastolic parameters are consistent with Grade I diastolic dysfunction (impaired relaxation). Right Ventricle: The right ventricular size is normal. No increase in right ventricular wall thickness. Right ventricular systolic function is normal. Left Atrium: Left atrial size was normal in size. Right Atrium: Right atrial size was normal in size. Pericardium: There is no evidence of pericardial effusion. Mitral Valve: The mitral valve is normal in structure. Mild mitral valve regurgitation. No evidence of mitral valve stenosis. MV peak gradient, 5.3 mmHg. The mean mitral valve gradient is 2.0 mmHg. Tricuspid Valve: The tricuspid valve is normal in structure. Tricuspid valve regurgitation is mild . No evidence of tricuspid stenosis. Aortic Valve: The aortic valve is normal in structure. Aortic valve regurgitation is trivial. No aortic stenosis is present. Aortic valve mean gradient measures 6.0 mmHg. Aortic valve peak gradient measures 10.8 mmHg. Aortic valve area, by VTI measures 1.44 cm. Pulmonic Valve: The pulmonic valve was normal in structure. Pulmonic valve regurgitation is not visualized. No evidence of pulmonic stenosis. Aorta: The aortic root is normal in size and structure. Venous: The inferior vena cava is normal in size with greater than 50% respiratory variability, suggesting right atrial pressure of 3 mmHg. IAS/Shunts: No atrial level shunt detected by color flow Doppler.  LEFT VENTRICLE PLAX 2D LVIDd:         4.10 cm  Diastology LVIDs:         2.50 cm  LV e' medial:    6.20 cm/s LV PW:          0.90 cm  LV E/e' medial:  13.3 LV IVS:  0.80 cm  LV e' lateral:   7.83 cm/s LVOT diam:     1.90 cm  LV E/e' lateral: 10.5 LV SV:         49 LV SV Index:   28 LVOT Area:     2.84 cm  RIGHT VENTRICLE RV Basal diam:  2.70 cm LEFT ATRIUM           Index       RIGHT ATRIUM          Index LA diam:      3.20 cm 1.82 cm/m  RA Area:     8.19 cm LA Vol (A4C): 22.1 ml 12.59 ml/m RA Volume:   15.20 ml 8.66 ml/m  AORTIC VALVE                    PULMONIC VALVE AV Area (Vmax):    1.60 cm     PV Vmax:       1.01 m/s AV Area (Vmean):   1.66 cm     PV Vmean:      70.600 cm/s AV Area (VTI):     1.44 cm     PV VTI:        0.208 m AV Vmax:           164.00 cm/s  PV Peak grad:  4.1 mmHg AV Vmean:          118.000 cm/s PV Mean grad:  2.0 mmHg AV VTI:            0.340 m AV Peak Grad:      10.8 mmHg AV Mean Grad:      6.0 mmHg LVOT Vmax:         92.70 cm/s LVOT Vmean:        69.200 cm/s LVOT VTI:          0.173 m LVOT/AV VTI ratio: 0.51  AORTA Ao Root diam: 3.00 cm MITRAL VALVE MV Area (PHT): 2.65 cm     SHUNTS MV Area VTI:   1.46 cm     Systemic VTI:  0.17 m MV Peak grad:  5.3 mmHg     Systemic Diam: 1.90 cm MV Mean grad:  2.0 mmHg MV Vmax:       1.15 m/s MV Vmean:      67.3 cm/s MV Decel Time: 286 msec MV E velocity: 82.30 cm/s MV A velocity: 101.00 cm/s MV E/A ratio:  0.81 Isaias Cowman MD Electronically signed by Isaias Cowman MD Signature Date/Time: 06/06/2021/1:46:41 PM    Final       Subjective: Ambulating in room.  No shortness of breath or chest pain.  No dizziness or lightheadedness  Discharge Exam: Vitals:   06/08/21 0316 06/08/21 0746  BP: (!) 121/54 (!) 141/66  Pulse: 60 66  Resp: 18 17  Temp: 98.1 F (36.7 C) (!) 97.5 F (36.4 C)  SpO2: 95% 97%   Vitals:   06/07/21 1948 06/08/21 0316 06/08/21 0505 06/08/21 0746  BP: (!) 109/50 (!) 121/54  (!) 141/66  Pulse: 72 60  66  Resp: '17 18  17  '$ Temp: 97.7 F (36.5 C) 98.1 F (36.7 C)  (!) 97.5 F (36.4 C)  TempSrc:  Oral    SpO2:  97% 95%  97%  Weight:   68.3 kg   Height:        General: Pt is alert, awake, not in acute distress Cardiovascular: RRR, S1/S2 +, no rubs, no gallops Respiratory: CTA bilaterally,  no wheezing, no rhonchi Abdominal: Soft, NT, ND, bowel sounds + Extremities: no edema, no cyanosis    The results of significant diagnostics from this hospitalization (including imaging, microbiology, ancillary and laboratory) are listed below for reference.     Microbiology: Recent Results (from the past 240 hour(s))  Resp Panel by RT-PCR (Flu A&B, Covid) Nasopharyngeal Swab     Status: None   Collection Time: 06/06/21 12:40 AM   Specimen: Nasopharyngeal Swab; Nasopharyngeal(NP) swabs in vial transport medium  Result Value Ref Range Status   SARS Coronavirus 2 by RT PCR NEGATIVE NEGATIVE Final    Comment: (NOTE) SARS-CoV-2 target nucleic acids are NOT DETECTED.  The SARS-CoV-2 RNA is generally detectable in upper respiratory specimens during the acute phase of infection. The lowest concentration of SARS-CoV-2 viral copies this assay can detect is 138 copies/mL. A negative result does not preclude SARS-Cov-2 infection and should not be used as the sole basis for treatment or other patient management decisions. A negative result may occur with  improper specimen collection/handling, submission of specimen other than nasopharyngeal swab, presence of viral mutation(s) within the areas targeted by this assay, and inadequate number of viral copies(<138 copies/mL). A negative result must be combined with clinical observations, patient history, and epidemiological information. The expected result is Negative.  Fact Sheet for Patients:  EntrepreneurPulse.com.au  Fact Sheet for Healthcare Providers:  IncredibleEmployment.be  This test is no t yet approved or cleared by the [place] FDA and  has been authorized for detection and/or diagnosis of SARS-CoV-2 by FDA  under an Emergency Use Authorization (EUA). This EUA will remain  in effect (meaning this test can be used) for the duration of the COVID-19 declaration under Section 564(b)(1) of the Act, 21 U.S.C.section 360bbb-3(b)(1), unless the authorization is terminated  or revoked sooner.       Influenza A by PCR NEGATIVE NEGATIVE Final   Influenza B by PCR NEGATIVE NEGATIVE Final    Comment: (NOTE) The Xpert Xpress SARS-CoV-2/FLU/RSV plus assay is intended as an aid in the diagnosis of influenza from Nasopharyngeal swab specimens and should not be used as a sole basis for treatment. Nasal washings and aspirates are unacceptable for Xpert Xpress SARS-CoV-2/FLU/RSV testing.  Fact Sheet for Patients: EntrepreneurPulse.com.au  Fact Sheet for Healthcare Providers: IncredibleEmployment.be  This test is not yet approved or cleared by the [place] FDA and has been authorized for detection and/or diagnosis of SARS-CoV-2 by FDA under an Emergency Use Authorization (EUA). This EUA will remain in effect (meaning this test can be used) for the duration of the COVID-19 declaration under Section 564(b)(1) of the Act, 21 U.S.C. section 360bbb-3(b)(1), unless the authorization is terminated or revoked.  Performed at [facility], [address] [place], [place] [address]      Labs: BNP (last 3 results) No results for input(s): BNP in the last 8760 hours. Basic Metabolic Panel: Recent Labs  Lab 06/05/21 2134 06/06/21 0519 06/07/21 0622 06/08/21 0418  NA 138 144 141 139  K 3.7 3.6 3.8 3.9  CL 108 109 108 110  CO2 '23 27 26 25  '$ GLUCOSE 120* 127* 133* 127*  BUN 30* 27* 24* 30*  CREATININE 0.99 0.87 1.02* 1.07*  CALCIUM 9.4 9.5 9.0 9.0   Liver Function Tests: Recent Labs  Lab 06/05/21 2134  AST 43*  ALT 21  ALKPHOS 61  BILITOT 0.7  PROT 7.1  ALBUMIN 4.4   No results for input(s): LIPASE, AMYLASE in the last 168 hours. No results  for input(s): AMMONIA in the last 168 hours. CBC: Recent Labs  Lab 06/05/21 2134 06/06/21 0519 06/07/21 0622 06/08/21 0418  WBC 5.5 5.0 4.2 5.2  HGB 10.6* 10.5* 10.6* 10.0*  HCT 30.8* 30.9* 31.2* 29.1*  MCV 87.0 87.0 88.1 87.7  PLT 173 171 164 160   Cardiac Enzymes: No results for input(s): CKTOTAL, CKMB, CKMBINDEX, TROPONINI in the last 168 hours. BNP: Invalid input(s): POCBNP CBG: No results for input(s): GLUCAP in the last 168 hours. D-Dimer No results for input(s): DDIMER in the last 72 hours. Hgb A1c Recent Labs    06/06/21 0519  HGBA1C 7.0*   Lipid Profile Recent Labs    06/06/21 0519  CHOL 116  HDL 44  LDLCALC 48  TRIG 122  CHOLHDL 2.6   Thyroid function studies No results for input(s): TSH, T4TOTAL, T3FREE, THYROIDAB in the last 72 hours.  Invalid input(s): FREET3 Anemia work up No results for input(s): VITAMINB12, FOLATE, FERRITIN, TIBC, IRON, RETICCTPCT in the last 72 hours. Urinalysis    Component Value Date/Time   COLORURINE YELLOW 12/21/2017 1140   APPEARANCEUR Clear 05/31/2018 1047   LABSPEC 1.025 12/21/2017 1140   LABSPEC 1.020 04/30/2012 1937   PHURINE 5.0 12/21/2017 1140   GLUCOSEU Negative 05/31/2018 1047   GLUCOSEU NEGATIVE 12/21/2017 1140   HGBUR NEGATIVE 12/21/2017 1140   BILIRUBINUR Negative 05/31/2018 1047   BILIRUBINUR Negative 04/30/2012 1937   KETONESUR NEGATIVE 12/21/2017 1140   PROTEINUR Negative 05/31/2018 1047   PROTEINUR NEGATIVE 08/15/2017 1013   UROBILINOGEN 0.2 04/18/2018 1004   UROBILINOGEN 0.2 12/21/2017 1140   NITRITE Negative 05/31/2018 1047   NITRITE NEGATIVE 12/21/2017 1140   LEUKOCYTESUR Trace (A) 05/31/2018 1047   LEUKOCYTESUR Trace 04/30/2012 1937   Sepsis Labs Invalid input(s): PROCALCITONIN,  WBC,  LACTICIDVEN Microbiology Recent Results (from the past 240 hour(s))  Resp Panel by RT-PCR (Flu A&B, Covid) Nasopharyngeal Swab     Status: None   Collection Time: 06/06/21 12:40 AM   Specimen:  Nasopharyngeal Swab; Nasopharyngeal(NP) swabs in vial transport medium  Result Value Ref Range Status   SARS Coronavirus 2 by RT PCR NEGATIVE NEGATIVE Final    Comment: (NOTE) SARS-CoV-2 target nucleic acids are NOT DETECTED.  The SARS-CoV-2 RNA is generally detectable in upper respiratory specimens during the acute phase of infection. The lowest concentration of SARS-CoV-2 viral copies this assay can detect is 138 copies/mL. A negative result does not preclude SARS-Cov-2 infection and should not be used as the sole basis for treatment or other patient management decisions. A negative result may occur with  improper specimen collection/handling, submission of specimen other than nasopharyngeal swab, presence of viral mutation(s) within the areas targeted by this assay, and inadequate number of viral copies(<138 copies/mL). A negative result must be combined with clinical observations, patient history, and epidemiological information. The expected result is Negative.  Fact Sheet for Patients:  EntrepreneurPulse.com.au  Fact Sheet for Healthcare Providers:  IncredibleEmployment.be  This test is no t yet approved or cleared by the [place] FDA and  has been authorized for detection and/or diagnosis of SARS-CoV-2 by FDA under an Emergency Use Authorization (EUA). This EUA will remain  in effect (meaning this test can be used) for the duration of the COVID-19 declaration under Section 564(b)(1) of the Act, 21 U.S.C.section 360bbb-3(b)(1), unless the authorization is terminated  or revoked sooner.       Influenza A by PCR NEGATIVE NEGATIVE Final   Influenza B by PCR NEGATIVE NEGATIVE Final    Comment: (NOTE)  The Xpert Xpress SARS-CoV-2/FLU/RSV plus assay is intended as an aid in the diagnosis of influenza from Nasopharyngeal swab specimens and should not be used as a sole basis for treatment. Nasal washings and aspirates are unacceptable for  Xpert Xpress SARS-CoV-2/FLU/RSV testing.  Fact Sheet for Patients: EntrepreneurPulse.com.au  Fact Sheet for Healthcare Providers: IncredibleEmployment.be  This test is not yet approved or cleared by the [place] FDA and has been authorized for detection and/or diagnosis of SARS-CoV-2 by FDA under an Emergency Use Authorization (EUA). This EUA will remain in effect (meaning this test can be used) for the duration of the COVID-19 declaration under Section 564(b)(1) of the Act, 21 U.S.C. section 360bbb-3(b)(1), unless the authorization is terminated or revoked.  Performed at [facility], [address], [place], [place] [address]      Time coordinating discharge: Over 30 minutes  SIGNED:   Nolberto Hanlon, MD  [facility] 06/08/2021, 4:59 PM Pager   If 7PM-7AM, please contact night-coverage [email or web address] Password [email or web address]

## 2021-06-09 ENCOUNTER — Telehealth: Payer: Self-pay

## 2021-06-09 NOTE — Telephone Encounter (Signed)
Transition Care Management Unsuccessful Follow-up Telephone Call  Date of discharge and from where:  06/08/21 from [facility]  Attempts:  1st Attempt  Reason for unsuccessful TCM follow-up call:  Left voice message. Scheduled with Cardiology 06/10/21. No transition of care completed at this time. Follow as appropriate.

## 2021-06-10 ENCOUNTER — Telehealth: Payer: Self-pay

## 2021-06-10 DIAGNOSIS — I25119 Atherosclerotic heart disease of native coronary artery with unspecified angina pectoris: Secondary | ICD-10-CM | POA: Diagnosis not present

## 2021-06-10 DIAGNOSIS — I10 Essential (primary) hypertension: Secondary | ICD-10-CM | POA: Diagnosis not present

## 2021-06-10 NOTE — Telephone Encounter (Signed)
Transition Care Management Unsuccessful Follow-up Telephone Call  Date of discharge and from where: 06/08/2021-[facility]   Attempts:  3rd Attempt  Reason for unsuccessful TCM follow-up call:  No answer/busy . Attempted to reach patient at home # & cell #.

## 2021-06-14 ENCOUNTER — Telehealth: Payer: Medicare Other

## 2021-06-28 ENCOUNTER — Telehealth: Payer: Self-pay

## 2021-06-28 ENCOUNTER — Other Ambulatory Visit: Payer: Self-pay | Admitting: Internal Medicine

## 2021-06-28 DIAGNOSIS — I739 Peripheral vascular disease, unspecified: Secondary | ICD-10-CM

## 2021-06-28 DIAGNOSIS — N179 Acute kidney failure, unspecified: Secondary | ICD-10-CM

## 2021-06-28 NOTE — Telephone Encounter (Signed)
Dr. Derrel Nip would like to make sure that the one they are wanting [place] to  change it to is still a doppler US.

## 2021-06-28 NOTE — Telephone Encounter (Signed)
I have reordered the correct [place] just not sure what to put in the reason for exam because nothing in the drop down box matches what you have typed in on the old order.

## 2021-06-28 NOTE — Telephone Encounter (Signed)
-----   Message from Ashley Jacobs sent at 06/27/2021  3:38 PM EDT ----- Regarding: order Good afternoon!  Needing new order for the US renal. New order id is [id number]. Please advise and Thank you!

## 2021-06-30 ENCOUNTER — Encounter: Payer: Self-pay | Admitting: Internal Medicine

## 2021-06-30 ENCOUNTER — Ambulatory Visit (INDEPENDENT_AMBULATORY_CARE_PROVIDER_SITE_OTHER): Payer: Medicare Other | Admitting: Internal Medicine

## 2021-06-30 ENCOUNTER — Other Ambulatory Visit: Payer: Self-pay

## 2021-06-30 VITALS — BP 116/68 | HR 76 | Temp 96.0°F | Ht 62.99 in | Wt 151.6 lb

## 2021-06-30 DIAGNOSIS — R0981 Nasal congestion: Secondary | ICD-10-CM

## 2021-06-30 DIAGNOSIS — I2510 Atherosclerotic heart disease of native coronary artery without angina pectoris: Secondary | ICD-10-CM

## 2021-06-30 DIAGNOSIS — E1140 Type 2 diabetes mellitus with diabetic neuropathy, unspecified: Secondary | ICD-10-CM | POA: Diagnosis not present

## 2021-06-30 DIAGNOSIS — N179 Acute kidney failure, unspecified: Secondary | ICD-10-CM

## 2021-06-30 DIAGNOSIS — N182 Chronic kidney disease, stage 2 (mild): Secondary | ICD-10-CM

## 2021-06-30 DIAGNOSIS — I6523 Occlusion and stenosis of bilateral carotid arteries: Secondary | ICD-10-CM

## 2021-06-30 LAB — BASIC METABOLIC PANEL
BUN: 27 mg/dL — ABNORMAL HIGH (ref 6–23)
CO2: 27 mEq/L (ref 19–32)
Calcium: 9.2 mg/dL (ref 8.4–10.5)
Chloride: 107 mEq/L (ref 96–112)
Creatinine, Ser: 1.24 mg/dL — ABNORMAL HIGH (ref 0.40–1.20)
GFR: 41.51 mL/min — ABNORMAL LOW (ref >60.00)
Glucose, Bld: 113 mg/dL — ABNORMAL HIGH (ref 70–99)
Potassium: 4.2 mEq/L (ref 3.5–5.1)
Sodium: 143 mEq/L (ref 135–145)

## 2021-06-30 MED ORDER — PREDNISONE 10 MG PO TABS: ORAL_TABLET | ORAL | 0 refills | Status: DC

## 2021-06-30 MED ORDER — NITROGLYCERIN 0.4 MG SL SUBL: 0.4000 mg | SUBLINGUAL_TABLET | SUBLINGUAL | 3 refills | Status: DC | PRN

## 2021-06-30 NOTE — Progress Notes (Signed)
Subjective:  Patient ID: Sabrina Montoya, female    DOB: January 19, 1941  Age: 80 y.o. MRN: [id number]  CC: The primary encounter diagnosis was Controlled type 2 diabetes with neuropathy ([place]). Diagnoses of Coronary artery disease involving native coronary artery of native heart, unspecified whether angina present, Chronic kidney disease, stage 2, mildly decreased GFR, Acute renal failure, unspecified acute renal failure type ([place]), and Sinus congestion were also pertinent to this visit.  HPI Sabrina Montoya presents for FOLLOW UP ON MULTIPLE ISSUES   This visit occurred during the SARS-CoV-2 public health emergency.  Safety protocols were in place, including screening questions prior to the visit, additional usage of staff PPE, and extensive cleaning of exam room while observing appropriate contact time as indicated for disinfecting solutions.   LEFT SIDED RHINITIS,  EAR PAIN AND cervical chain lymphadenopathy for the past several days,   USING AN OLD OTC TOPICAL RINSE REMEDY REC'D BY ENT SPECIALIST  "ALKALOL"  Hypertension:  she is tolerating METOPROLOL AND IMDUR CHANGES THAT OCCURRED DURING HOSPITALIZATION FOR NSTEMI   Still feeling a  sensation of "COLD AIR IN MY CHEST"  which occurs intermittently and has been worrying her because she had the same feeling before her first  cardiac cath which diagnosed CAD years ago  and before her most recent MI.   Has not taken  NTG during episodes.  Recent cardiology follow up was unpleasant ; she apparently confronted her cardiologist about not calling her back and he became angry and yelled at her .  She is requesting to change cardiologists to [place], who did her most recent cath/ .      Outpatient Medications Prior to Visit  Medication Sig Dispense Refill   aspirin 81 MG tablet Take 81 mg by mouth daily.     cholecalciferol (VITAMIN D) 1000 UNITS tablet Take 1,000 Units by mouth daily.     conjugated estrogens (PREMARIN) vaginal cream Place vaginally as  needed.     Cyanocobalamin (VITAMIN B12 PO) Take 1 Dose by mouth daily. GUMMY CHEWS; 3,000 mg     isosorbide mononitrate (IMDUR) 60 MG 24 hr tablet Take 1 tablet (60 mg total) by mouth daily. 30 tablet 0   methocarbamol (ROBAXIN) 500 MG tablet Take 500 mg by mouth at bedtime.     metoprolol succinate (TOPROL-XL) 25 MG 24 hr tablet Take 1 tablet (25 mg total) by mouth daily. Take with or immediately following a meal. 30 tablet 0   Multiple Vitamins-Minerals (PRESERVISION AREDS 2 PO) Take by mouth 2 (two) times daily.     omeprazole (PRILOSEC) 20 MG capsule TAKE 1 CAPSULE(20 MG) BY MOUTH DAILY 90 capsule 1   rosuvastatin (CRESTOR) 40 MG tablet Take 1 tablet (40 mg total) by mouth daily. 90 tablet 0   Turmeric (QC TUMERIC COMPLEX PO) Take 1 mg by mouth daily.     No facility-administered medications prior to visit.    Review of Systems;  Patient denies headache, fevers, malaise, unintentional weight loss, skin rash, eye pain, sinus congestion and sinus pain, sore throat, dysphagia,  hemoptysis , cough, dyspnea, wheezing, chest pain, palpitations, orthopnea, edema, abdominal pain, nausea, melena, diarrhea, constipation, flank pain, dysuria, hematuria, urinary  Frequency, nocturia, numbness, tingling, seizures,  Focal weakness, Loss of consciousness,  Tremor, insomnia, depression, anxiety, and suicidal ideation.      Objective:  BP 116/68   Pulse 76   Temp (!) 96 F (35.6 C) Comment: temporal  Ht 5' 2.99" (1.6 m)   Wt  151 lb 9.6 oz (68.8 kg)   SpO2 95%   BMI 26.86 kg/m   BP Readings from Last 3 Encounters:  06/30/21 116/68  06/08/21 (!) 141/66  05/19/21 137/64    Wt Readings from Last 3 Encounters:  06/30/21 151 lb 9.6 oz (68.8 kg)  06/08/21 150 lb 8 oz (68.3 kg)  05/05/21 150 lb (68 kg)    General appearance: alert, cooperative and appears stated age Ears: normal TM's and external ear canals both ears Face: no sinus tenderness  Throat: lips, mucosa, and tongue normal; teeth  and gums normal Neck: no adenopathy, no carotid bruit, supple, symmetrical, trachea midline and thyroid not enlarged, symmetric, no tenderness/mass/nodules Back: symmetric, no curvature. ROM normal. No CVA tenderness. Lungs: clear to auscultation bilaterally Heart: regular rate and rhythm, S1, S2 normal, no murmur, click, rub or gallop Abdomen: soft, non-tender; bowel sounds normal; no masses,  no organomegaly Pulses: 2+ and symmetric Skin: Skin color, texture, turgor normal. No rashes or lesions Lymph nodes: Cervical LNs tender without enlargement.   supraclavicular, and axillary nodes normal.  Lab Results  Component Value Date   HGBA1C 7.0 (H) 06/06/2021   HGBA1C 7.3 (H) 04/27/2021   HGBA1C 6.6 (H) 05/13/2020    Lab Results  Component Value Date   CREATININE 1.24 (H) 06/30/2021   CREATININE 1.07 (H) 06/08/2021   CREATININE 1.02 (H) 06/07/2021    Lab Results  Component Value Date   WBC 5.2 06/08/2021   HGB 10.0 (L) 06/08/2021   HCT 29.1 (L) 06/08/2021   PLT 160 06/08/2021   GLUCOSE 113 (H) 06/30/2021   CHOL 116 06/06/2021   TRIG 122 06/06/2021   HDL 44 06/06/2021   LDLDIRECT 48.0 04/18/2018   LDLCALC 48 06/06/2021   ALT 21 06/05/2021   AST 43 (H) 06/05/2021   NA 143 06/30/2021   K 4.2 06/30/2021   CL 107 06/30/2021   CREATININE 1.24 (H) 06/30/2021   BUN 27 (H) 06/30/2021   CO2 27 06/30/2021   TSH 1.64 06/02/2019   INR 1.1 06/06/2021   HGBA1C 7.0 (H) 06/06/2021   MICROALBUR 2.5 (H) 04/27/2021    CARDIAC CATHETERIZATION  Result Date: 06/07/2021 Formatting of this result is different from the original.   Mid RCA lesion is 100% stenosed.   Prox Cx to Mid Cx lesion is 100% stenosed.   Ost Cx to Prox Cx lesion is 95% stenosed.   Ost LM lesion is 30% stenosed.   Ost LAD lesion is 100% stenosed.   Origin lesion is 100% stenosed.   The left ventricular systolic function is normal. 1.  Non-ST elevation myocardial infarction 2.  Severe three-vessel coronary artery disease  with chronically occluded ostial LAD, chronically occluded left circumflex, moderate to heavily calcified 95% stenosis proximal left circumflex, chronically occluded nondominant mid RCA 3.  Patent LIMA to distal LAD, patent SVG to left PDA with retrograde flow to proximal/mid left circumflex and OM1, chronically occluded SVG to OM1 4.  Preserved left ventricular function, with LVEF greater than 55%   Result Date: 06/06/2021    ECHOCARDIOGRAM REPORT   Procedure: 2D Echo, Color Doppler, Cardiac Doppler and Intracardiac            Opacification Agent Indications:     R07.9 Chest Pain  History:         Patient has no prior history of Echocardiogram examinations.                  CAD, Prior CABG, CKD; Risk Factors:Hypertension,  Dyslipidemia                  and Current Smoker.  Sonographer:     Charmayne Sheer RDCS (AE) Referring Phys:  [id number] Candace Gallus MELVIN Diagnosing Phys: Isaias Cowman MD  Sonographer Comments: Suboptimal apical window and suboptimal subcostal window. IMPRESSIONS  1. Left ventricular ejection fraction, by estimation, is 60 to 65%. The left ventricle has normal function. Left ventricular endocardial border not optimally defined to evaluate regional wall motion. Left ventricular diastolic parameters are consistent with Grade I diastolic dysfunction (impaired relaxation).  2. Right ventricular systolic function is normal. The right ventricular size is normal.  3. The mitral valve is normal in structure. Mild mitral valve regurgitation. No evidence of mitral stenosis.  4. The aortic valve is normal in structure. Aortic valve regurgitation is trivial. No aortic stenosis is present.  5. The inferior vena cava is normal in size with greater than 50% respiratory variability, suggesting right atrial pressure of 3 mmHg. FINDINGS  Left Ventricle: Left ventricular ejection fraction, by estimation, is 60 to 65%. The left ventricle has normal function. Left ventricular endocardial border not optimally  defined to evaluate regional wall motion. Definity contrast agent was given IV to delineate the left ventricular endocardial borders. The left ventricular internal cavity size was normal in size. There is no left ventricular hypertrophy. Left ventricular diastolic parameters are consistent with Grade I diastolic dysfunction (impaired relaxation). Right Ventricle: The right ventricular size is normal. No increase in right ventricular wall thickness. Right ventricular systolic function is normal. Left Atrium: Left atrial size was normal in size. Right Atrium: Right atrial size was normal in size. Pericardium: There is no evidence of pericardial effusion. Mitral Valve: The mitral valve is normal in structure. Mild mitral valve regurgitation. No evidence of mitral valve stenosis. MV peak gradient, 5.3 mmHg. The mean mitral valve gradient is 2.0 mmHg. Tricuspid Valve: The tricuspid valve is normal in structure. Tricuspid valve regurgitation is mild . No evidence of tricuspid stenosis. Aortic Valve: The aortic valve is normal in structure. Aortic valve regurgitation is trivial. No aortic stenosis is present. Aortic valve mean gradient measures 6.0 mmHg. Aortic valve peak gradient measures 10.8 mmHg. Aortic valve area, by VTI measures 1.44 cm. Pulmonic Valve: The pulmonic valve was normal in structure. Pulmonic valve regurgitation is not visualized. No evidence of pulmonic stenosis. Aorta: The aortic root is normal in size and structure. Venous: The inferior vena cava is normal in size with greater than 50% respiratory variability, suggesting right atrial pressure of 3 mmHg. IAS/Shunts: No atrial level shunt detected by color flow Doppler.    Assessment & Plan:   Problem List Items Addressed This Visit       Unprioritized   Acute renal failure ([place])    Labs rom the last 6 weeks reviewed.  Her GFR dropped during follow up BMET for telmisartan start, then  normalized after stopping telmisartan, but has dropped  again,  Likely due to cardiac cathetrization done August 2 . She is not taking NSAIDs or diuretics.  Renal artery doppler has been ordered  But not done yet . Referring to nephrology for further evaluation .  Lab Results  Component Value Date   CREATININE 1.24 (H) 06/30/2021   Lab Results  Component Value Date   NA 143 06/30/2021   K 4.2 06/30/2021   CL 107 06/30/2021   CO2 27 06/30/2021         Relevant Orders   Ambulatory referral to  Nephrology   CAD (coronary artery disease), native coronary artery    S/P NSTEMI with severe 3 vessel disease noted on recent cardiac catheterization.  Unclear if her current symptoms are atypical or unstable angina .  Advised to use her NTG the next time the symptoms occur. Referring to Dr. Saralyn Pilar for cardiac follow up.       Relevant Medications   nitroGLYCERIN (NITROSTAT) 0.4 MG SL tablet   Other Relevant Orders   Ambulatory referral to Cardiology   Controlled type 2 diabetes with neuropathy ([place]) - Primary   Sinus congestion    Symptoms currently appear to be due to viral or allergic etiology.  Prednisone taper prescribed; patient advised to repeat her home COVID test in 48 hours.       Other Visit Diagnoses     Chronic kidney disease, stage 2, mildly decreased GFR       Relevant Orders   Basic metabolic panel (Completed)      Meds ordered this encounter  Medications   predniSONE (DELTASONE) 10 MG tablet    Sig: 6 tablets on Day 1 , then reduce by 1 tablet daily until gone    Dispense:  21 tablet    Refill:  0   nitroGLYCERIN (NITROSTAT) 0.4 MG SL tablet    Sig: Place 1 tablet (0.4 mg total) under the tongue every 5 (five) minutes as needed for chest pain.    Dispense:  50 tablet    Refill:  3   I provided  40 minutes  during this encounter reviewing patient's current problems and recent hospitalization, cardiology follow up,  cardiac catheterization and ECHO report ,  labs and imaging studies, providing counseling on the above  mentioned problems I n a face to face visit  , and coordination  of care .   There are no discontinued medications.  Follow-up: No follow-ups on file.   Crecencio Mc, MD

## 2021-06-30 NOTE — Assessment & Plan Note (Addendum)
S/P NSTEMI with severe 3 vessel disease noted on recent cardiac catheterization.  Unclear if her current symptoms are atypical or unstable angina .  Advised to use her NTG the next time the symptoms occur. Referring to Dr. Saralyn Pilar for cardiac follow up.

## 2021-06-30 NOTE — Patient Instructions (Signed)
Take a NTG tablet the next time  you have the cold feeling in your chest   Take the prednisone taper for the sinuses   Referral to Dr Saralyn Pilar is underway   Cardiopulmonary rehab referall is underway   Renal  artery doppler is being scheduled to check blood flow to kidneys

## 2021-07-01 ENCOUNTER — Telehealth: Payer: Self-pay

## 2021-07-01 DIAGNOSIS — R0981 Nasal congestion: Secondary | ICD-10-CM | POA: Insufficient documentation

## 2021-07-01 NOTE — Assessment & Plan Note (Signed)
Labs rom the last 6 weeks reviewed.  Her GFR dropped during follow up BMET for telmisartan start, then  normalized after stopping telmisartan, but has dropped again,  Likely due to cardiac cathetrization done August 2 . She is not taking NSAIDs or diuretics.  Renal artery doppler has been ordered  But not done yet . Referring to nephrology for further evaluation .  Lab Results  Component Value Date   CREATININE 1.24 (H) 06/30/2021   Lab Results  Component Value Date   NA 143 06/30/2021   K 4.2 06/30/2021   CL 107 06/30/2021   CO2 27 06/30/2021

## 2021-07-01 NOTE — Assessment & Plan Note (Signed)
Symptoms currently appear to be due to viral or allergic etiology.  Prednisone taper prescribed; patient advised to repeat her home COVID test in 48 hours.

## 2021-07-01 NOTE — Telephone Encounter (Signed)
LMTCB in regards to lab results.  

## 2021-07-06 ENCOUNTER — Other Ambulatory Visit: Payer: Self-pay

## 2021-07-06 ENCOUNTER — Ambulatory Visit (INDEPENDENT_AMBULATORY_CARE_PROVIDER_SITE_OTHER): Payer: Medicare Other

## 2021-07-06 DIAGNOSIS — I739 Peripheral vascular disease, unspecified: Secondary | ICD-10-CM

## 2021-07-06 DIAGNOSIS — N179 Acute kidney failure, unspecified: Secondary | ICD-10-CM | POA: Diagnosis not present

## 2021-07-07 ENCOUNTER — Other Ambulatory Visit: Payer: Self-pay

## 2021-07-07 ENCOUNTER — Telehealth: Payer: Self-pay | Admitting: Internal Medicine

## 2021-07-07 DIAGNOSIS — N182 Chronic kidney disease, stage 2 (mild): Secondary | ICD-10-CM

## 2021-07-07 DIAGNOSIS — I714 Abdominal aortic aneurysm, without rupture: Secondary | ICD-10-CM | POA: Diagnosis not present

## 2021-07-07 DIAGNOSIS — I2510 Atherosclerotic heart disease of native coronary artery without angina pectoris: Secondary | ICD-10-CM | POA: Diagnosis not present

## 2021-07-07 DIAGNOSIS — I10 Essential (primary) hypertension: Secondary | ICD-10-CM | POA: Diagnosis not present

## 2021-07-07 DIAGNOSIS — R002 Palpitations: Secondary | ICD-10-CM | POA: Diagnosis not present

## 2021-07-07 DIAGNOSIS — I25119 Atherosclerotic heart disease of native coronary artery with unspecified angina pectoris: Secondary | ICD-10-CM | POA: Diagnosis not present

## 2021-07-07 DIAGNOSIS — F17200 Nicotine dependence, unspecified, uncomplicated: Secondary | ICD-10-CM | POA: Diagnosis not present

## 2021-07-07 DIAGNOSIS — I6523 Occlusion and stenosis of bilateral carotid arteries: Secondary | ICD-10-CM | POA: Diagnosis not present

## 2021-07-07 NOTE — Telephone Encounter (Signed)
Rejection Reason - Patient Declined - Pt states she was unaware of referral and does not want to schedule now." Sabrina Montoya said on Jul 06, 2021 1:54 PM  Msg from [facility] [place] [facility]

## 2021-07-08 ENCOUNTER — Ambulatory Visit (INDEPENDENT_AMBULATORY_CARE_PROVIDER_SITE_OTHER): Payer: Medicare Other | Admitting: Pharmacist

## 2021-07-08 DIAGNOSIS — I2510 Atherosclerotic heart disease of native coronary artery without angina pectoris: Secondary | ICD-10-CM

## 2021-07-08 DIAGNOSIS — N182 Chronic kidney disease, stage 2 (mild): Secondary | ICD-10-CM

## 2021-07-08 DIAGNOSIS — E7800 Pure hypercholesterolemia, unspecified: Secondary | ICD-10-CM

## 2021-07-08 DIAGNOSIS — E1122 Type 2 diabetes mellitus with diabetic chronic kidney disease: Secondary | ICD-10-CM

## 2021-07-08 DIAGNOSIS — I739 Peripheral vascular disease, unspecified: Secondary | ICD-10-CM

## 2021-07-08 NOTE — Patient Instructions (Signed)
Visit Information  PATIENT GOALS:  Goals Addressed               This Visit's Progress     Patient Stated     Medication Monitoring (pt-stated)        Patient Goals/Self-Care Activities Over the next 90 days, patient will:  - take medications as prescribed check blood pressure periodically, document, and provide at future appointments         Patient verbalizes understanding of instructions provided today and agrees to view in [place].  Plan: Telephone follow up appointment with care management team member scheduled for:  ~ 8 weeks  Catie Darnelle Maffucci, PharmD, [place], [place] Clinical Pharmacist [employer] [facility] [employer] [phone]

## 2021-07-08 NOTE — Chronic Care Management (AMB) (Signed)
Chronic Care Management Pharmacy Note  07/08/2021 Name:  Sabrina Montoya MRN:  [id number] DOB:  24-Jan-1941   Subjective: Sabrina Montoya is an 80 y.o. year old female who is a primary patient of Derrel Nip, Aris Everts, MD.  The CCM team was consulted for assistance with disease management and care coordination needs.    Engaged with patient by telephone for follow up visit in response to provider referral for pharmacy case management and/or care coordination services.   Consent to Services:  The patient was given information about Chronic Care Management services, agreed to services, and gave verbal consent prior to initiation of services.  Please see initial visit note for detailed documentation.   Patient Care Team: Crecencio Mc, MD as PCP - General (Internal Medicine) Crecencio Mc, MD (Internal Medicine) Bary Castilla Forest Gleason, MD (General Surgery) De Hollingshead, RPH-CPP (Pharmacist)  Objective:  Lab Results  Component Value Date   CREATININE 1.24 (H) 06/30/2021   CREATININE 1.07 (H) 06/08/2021   CREATININE 1.02 (H) 06/07/2021    Lab Results  Component Value Date   HGBA1C 7.0 (H) 06/06/2021   Last diabetic Eye exam:  Lab Results  Component Value Date/Time   HMDIABEYEEXA No Retinopathy 04/29/2019 12:00 AM    Last diabetic Foot exam: No results found for: HMDIABFOOTEX      Component Value Date/Time   CHOL 116 06/06/2021 0519   TRIG 122 06/06/2021 0519   HDL 44 06/06/2021 0519   CHOLHDL 2.6 06/06/2021 0519   VLDL 24 06/06/2021 0519   LDLCALC 48 06/06/2021 0519   LDLDIRECT 48.0 04/18/2018 1008    Hepatic Function Latest Ref Rng & Units 06/05/2021 04/27/2021 05/13/2020  Total Protein 6.5 - 8.1 g/dL 7.1 7.2 7.1  Albumin 3.5 - 5.0 g/dL 4.4 4.8 4.8  AST 15 - 41 U/L 43(H) 36 28  ALT 0 - 44 U/L '21 16 14  ' Alk Phosphatase 38 - 126 U/L 61 71 61  Total Bilirubin 0.3 - 1.2 mg/dL 0.7 0.6 0.5  Bilirubin, Direct 0.0 - 0.3 mg/dL - - -    Lab Results  Component Value Date/Time    TSH 1.64 06/02/2019 02:01 PM   TSH 1.79 03/07/2016 09:37 AM    CBC Latest Ref Rng & Units 06/08/2021 06/07/2021 06/06/2021  WBC 4.0 - 10.5 K/uL 5.2 4.2 5.0  Hemoglobin 12.0 - 15.0 g/dL 10.0(L) 10.6(L) 10.5(L)  Hematocrit 36.0 - 46.0 % 29.1(L) 31.2(L) 30.9(L)  Platelets 150 - 400 K/uL 160 164 171    Lab Results  Component Value Date/Time   VD25OH 65.73 08/22/2016 08:46 AM   VD25OH 52.88 03/07/2016 09:37 AM    Clinical ASCVD: Yes  The ASCVD Risk score Mikey Bussing DC Jr., et al., 2013) failed to calculate for the following reasons:   The patient has a prior MI or stroke diagnosis     Social History   Tobacco Use  Smoking Status Former   Packs/day: 0.50   Years: 30.00   Pack years: 15.00   Types: Cigarettes   Quit date: 04/01/2012   Years since quitting: 9.2  Smokeless Tobacco Never  Tobacco Comments   quit for 2 years after sinus infection and 6 months after heart surgery   BP Readings from Last 3 Encounters:  06/30/21 116/68  06/08/21 (!) 141/66  05/19/21 137/64   Pulse Readings from Last 3 Encounters:  06/30/21 76  06/08/21 66  05/19/21 79   Wt Readings from Last 3 Encounters:  06/30/21 151 lb 9.6 oz (  68.8 kg)  06/08/21 150 lb 8 oz (68.3 kg)  05/05/21 150 lb (68 kg)    Assessment: Review of patient past medical history, allergies, medications, health status, including review of consultants reports, laboratory and other test data, was performed as part of comprehensive evaluation and provision of chronic care management services.   SDOH:  (Social Determinants of Health) assessments and interventions performed:  SDOH Interventions    Flowsheet Row Most Recent Value  SDOH Interventions   Financial Strain Interventions Intervention Not Indicated  Stress Interventions Other (Comment)  [LCSW referral]       CCM Care Plan  Allergies  Allergen Reactions   Telmisartan Other (See Comments)    Acute renal failure   Niacin And Related Hives   Sertraline Hcl Other (See  Comments)    Hallucinations,     Sulfa Drugs Cross Reactors Nausea Only   Tetracyclines & Related Hives   Latex Rash    RAST testing was NEGATIVE  for LATEX   Simvastatin Rash    *Antihyperlipidemics*; elevated LFT's.     Medications Reviewed Today     Reviewed by De Hollingshead, RPH-CPP (Pharmacist) on 07/08/21 at 1354  Med List Status: <None>   Medication Order Taking? Sig Documenting Provider Last Dose Status Informant  amLODipine (NORVASC) 5 MG tablet [id number]  Take 5 mg by mouth daily. [provider]  Active   aspirin 81 MG tablet [id number] Yes Take 81 mg by mouth daily. [provider] Taking Active Self  cholecalciferol (VITAMIN D) 1000 UNITS tablet [id number] Yes Take 1,000 Units by mouth daily. [provider] Taking Active Self  conjugated estrogens (PREMARIN) vaginal cream [id number] Yes Place vaginally as needed. [provider] Taking Active Self  Cyanocobalamin (VITAMIN B12 PO) [id number] Yes Take 1 Dose by mouth daily. GUMMY CHEWS; 3,000 mg [provider] Taking Active Self  isosorbide mononitrate (IMDUR) 60 MG 24 hr tablet [id number] Yes Take 1 tablet (60 mg total) by mouth daily. Nolberto Hanlon, MD Taking Active   methocarbamol (ROBAXIN) 500 MG tablet [id number] No Take 500 mg by mouth at bedtime.  Patient not taking: Reported on 07/08/2021   [provider] Not Taking Active Self  metoprolol succinate (TOPROL-XL) 100 MG 24 hr tablet [id number] Yes Take 100 mg by mouth daily. [provider] Taking Active   Multiple Vitamins-Minerals (PRESERVISION AREDS 2 PO) [id number] Yes Take by mouth 2 (two) times daily. [provider] Taking Active Self  nitroGLYCERIN (NITROSTAT) 0.4 MG SL tablet [id number] Yes Place 1 tablet (0.4 mg total) under the tongue every 5 (five) minutes as needed for chest pain. Crecencio Mc, MD Taking Active   omeprazole (PRILOSEC) 20 MG capsule [id number] Yes TAKE 1 CAPSULE(20 MG) BY  MOUTH DAILY Crecencio Mc, MD Taking Active Self  rosuvastatin (CRESTOR) 40 MG tablet [id number] Yes Take 1 tablet (40 mg total) by mouth daily. Crecencio Mc, MD Taking Active Self  Turmeric (QC TUMERIC COMPLEX PO) [id number] Yes Take 1 mg by mouth daily. [provider] Taking Active Self  Med List Note Leeanne Rio, [place] 11/13/17 5465): Please notify lab that pt needs a butterfly due to blood flow issues. Pt asked that I make a note in her chart so that lab tech dont get upset everytime she mentions it to them.            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 05/05/2021   Extensor tenosynovitis of right wrist 05/05/2021   Pain, dental 03/17/2021   Chronic thumb pain, right 05/15/2020   Syncope 12/31/2019   Arrhythmia, sinus node 05/29/2019   Fatigue 06/07/2018   Insomnia due to anxiety and fear 04/20/2018   Chronic bladder pain 12/23/2017   Irritable bowel syndrome (IBS) 12/23/2017   Atrophic vaginitis 02/11/2017   CKD (chronic kidney disease) stage 3, GFR 30-59 ml/min ([place]) 08/24/2016   Dysphagia, pharyngoesophageal phase 06/20/2016   Sciatica, left side 06/20/2016   Controlled type 2 diabetes with neuropathy ([place]) 05/16/2016   Preventative health care 02/06/2016   Cervicalgia 01/09/2015   Cystocele 08/29/2014   Acute renal failure ([place]) 05/02/2014   Anemia 05/02/2014   TIA (transient ischemic attack) 09/11/2013   Overweight (BMI 25.0-29.9) 03/16/2013   Bilateral carotid artery stenosis 03/03/2013   Ulnar neuropathy of left upper extremity 03/03/2013   Chronic reflux esophagitis 12/29/2012   Right hip pain 12/29/2012   History of tobacco abuse 07/03/2012   AAA (abdominal aortic aneurysm) without rupture (HCC)    CAD (coronary artery  disease), autologous vein bypass graft    Peripheral vascular disease ([place])    Arthritis 02/29/2012   Hyperlipidemia    Osteopenia 08/29/2011   Vitamin D deficiency 08/29/2011    Immunization History  Administered Date(s) Administered   Fluad Quad(high Dose 65+) 08/01/2019   Influenza Split 10/10/2011, 08/26/2012   Influenza, High Dose Seasonal PF 08/16/2016, 11/09/2017, 09/06/2018   Influenza,inj,Quad PF,6+ Mos 07/25/2013, 07/28/2014, 08/13/2015   Influenza-Unspecified 08/06/2012   PFIZER(Purple Top)SARS-COV-2 Vaccination 11/12/2019, 12/03/2019, 08/06/2020   Pneumococcal Conjugate-13 08/26/2014   Pneumococcal Polysaccharide-23 09/02/2010    Conditions to be addressed/monitored: CAD, HTN, DMII, and CKD  Care Plan : Medication Management  Updates made by De Hollingshead, RPH-CPP since 07/08/2021 12:00 AM     Problem: Diabetes, CKD, HTN, CAD, PVD      Long-Range Goal: Disease Progression Prevention   Start Date: 05/12/2021  This Visit's Progress: On track  Recent Progress: On track  Priority: High  Note:   Current Barriers:  Suboptimal therapeutic regimen for diabetes, CKD , CAD  Pharmacist Clinical Goal(s):  Over the next 90 days, patient will achieve control of diabetes as evidenced by A1c  through collaboration with PharmD and provider.   Interventions: 1:1 collaboration with Crecencio Mc, MD regarding development and update of comprehensive plan of care as evidenced by provider attestation and co-signature Inter-disciplinary care team collaboration (see longitudinal plan of care) Comprehensive medication review performed; medication list updated in electronic medical record  SDOH: Lives alone. Reports concerns lately with stress, describes as episodes of "panic". Reports frequently over the past week with stressors related to her cat's health. History of anxiety treated with various pharmacotherapy. Discussed role of LCSW on the team. Patient amenable to working  with her. Will collaborate w/ Care Guide to outreach to schedule  Health Maintenance Yearly diabetic eye exam: up to date Yearly diabetic foot exam: due Urine microalbumin: up to date Yearly influenza vaccination: due Td/Tdap vaccination: up to date Pneumonia vaccination: up to date COVID vaccinations: due for booster, will discuss later Shingrix vaccinations: due - will discuss moving forward  Diabetes in the setting of CKD, CAD Right at goal of A1c <7%; current treatment: none  Current glucose readings: not checking Considered starting SGLT2; however, will defer given recent bump in Scr and  need to re-establish with nephrology. Will discuss moving forward.   Hypertension (CAD, s/p CABG 2006, PVD) in the setting of CKD Uncontrolled but anticipated to be improved; current treatment: isosorbide 60 mg daily, amlodipine 5 mg daily, metoprolol succinate 100 gm daily; follows w/ [facility] cardiology now Amlodipine started by cardiology yesterday. Patient has not picked up yet.  Acute rise in Scr (38%) with telmisartan initiation. Avoiding ARB. Will follow for impact of amlodipine addition on BP. Recommended to continue current regimen at this time [facility], last visit in 03/2020. Advised patient to call their office and schedule follow up, but let us know if a new referral is needed.   Hyperlipidemia, secondary ASCVD prevention: Lipids controlled at goal <70; current treatment: rosuvastatin 40 mg daily;  Medications previously tried: niacin, simvastatin Antiplatelet regimen: aspirin 81 mg daily Recommended to continue current regimen at this time  GERD/hx esophagitis: Appropriately managed; current regimen: omeprazole 20 mg daily  PPI therapy appropriate given hx esophagitis. Previously recommended to continue current regimen at this time  ?Atropic Vaginitis: Controlled per patient report; premarin cream vaginally PRN Previously  recommend to continue current regimen at this time  Supplements: Vitamin D, Vitamin B12, Preservision AREDS 2, turmeric   Patient Goals/Self-Care Activities Over the next 90 days, patient will:  - take medications as prescribed check blood pressure periodically, document, and provide at future appointments  Follow Up Plan: Telephone follow up appointment with care management team member scheduled for: ~ 8 weeks      Medication Assistance: None required.  Patient affirms current coverage meets needs.  Patient's preferred pharmacy is:  [id number] [address], [place] AT [facility] [address] [place] [address] Phone: [phone] Fax: [phone]    Follow Up:  Patient agrees to Care Plan and Follow-up.  Plan: Telephone follow up appointment with care management team member scheduled for:  ~ 8 weeks  Catie Darnelle Maffucci, PharmD, [place], [place] Clinical Pharmacist [employer] [facility] [employer] [phone]

## 2021-07-08 NOTE — Telephone Encounter (Signed)
Patient requests to be seen by [facility] at [facility]. Last seen there 03/10/2020. Spoke with Elvina Mattes, advised patient to call [facility] at [facility] at [phone] to be scheduled. Let her know to call our office if a new referral needs to be placed. Will send this information in a MyChart as well

## 2021-07-10 DIAGNOSIS — Z20822 Contact with and (suspected) exposure to covid-19: Secondary | ICD-10-CM | POA: Diagnosis not present

## 2021-07-12 ENCOUNTER — Other Ambulatory Visit (INDEPENDENT_AMBULATORY_CARE_PROVIDER_SITE_OTHER): Payer: Medicare Other

## 2021-07-12 ENCOUNTER — Telehealth: Payer: Self-pay | Admitting: Internal Medicine

## 2021-07-12 ENCOUNTER — Telehealth: Payer: Self-pay

## 2021-07-12 ENCOUNTER — Other Ambulatory Visit: Payer: Self-pay

## 2021-07-12 DIAGNOSIS — N182 Chronic kidney disease, stage 2 (mild): Secondary | ICD-10-CM

## 2021-07-12 LAB — BASIC METABOLIC PANEL
BUN: 24 mg/dL — ABNORMAL HIGH (ref 6–23)
CO2: 25 mEq/L (ref 19–32)
Calcium: 8.9 mg/dL (ref 8.4–10.5)
Chloride: 106 mEq/L (ref 96–112)
Creatinine, Ser: 1.07 mg/dL (ref 0.40–1.20)
GFR: 49.54 mL/min — ABNORMAL LOW (ref >60.00)
Glucose, Bld: 172 mg/dL — ABNORMAL HIGH (ref 70–99)
Potassium: 4.4 mEq/L (ref 3.5–5.1)
Sodium: 141 mEq/L (ref 135–145)

## 2021-07-12 NOTE — Progress Notes (Signed)
Patient has been scheduled

## 2021-07-12 NOTE — Telephone Encounter (Signed)
Patient came in for labs and is checking to see if the results for her ultrasound she had last week are available.Please advise.

## 2021-07-12 NOTE — Chronic Care Management (AMB) (Signed)
  Chronic Care Management   Note  07/12/2021 Name: Sabrina Montoya MRN: [id number] DOB: 12-11-1940  Sabrina Montoya is a 80 y.o. year old female who is a primary care patient of Crecencio Mc, MD. Sabrina Montoya is currently enrolled in care management services. An additional referral for LCSW was placed.   Follow up plan: Telephone appointment with care management team member scheduled for:07/21/2021  Noreene Larsson, [place], [place], [place] [address] Direct Dial: [phone] [email or web address]: [email or web address]

## 2021-07-13 NOTE — Telephone Encounter (Signed)
Spoke with pt to let her know that we have not received the results yet. I let her know that I would call over to [facility] and request the results. Pt gave a verbal understanding.   [facility] is closed from 12-1 pm.

## 2021-07-15 NOTE — Telephone Encounter (Signed)
Renal artery doppler showed NO SIGNS OF RENAL ARTERY STENOSIS.  Bilateral cysts were seen which are NOTHING TO WORRY ABOUT

## 2021-07-15 NOTE — Telephone Encounter (Signed)
Spoke with pt and she is requesting the results of her [place] that was done at [facility]. She stated that they told her since you ordered the [place] that she would have to call us for the results. The results are now visible in Epic. Pt also wanted to let you that this morning at 9:30 her blood pressure was 92/57 and she felt like she had no energy. Pt stated that it did come back up to 128/69.

## 2021-07-20 NOTE — Telephone Encounter (Signed)
Spoke with pt to let her know of the [place] results. Pt gave a verbal understanding.

## 2021-07-21 ENCOUNTER — Ambulatory Visit: Payer: Medicare Other | Admitting: *Deleted

## 2021-07-21 DIAGNOSIS — M151 Heberden's nodes (with arthropathy): Secondary | ICD-10-CM

## 2021-07-21 DIAGNOSIS — G459 Transient cerebral ischemic attack, unspecified: Secondary | ICD-10-CM

## 2021-07-21 DIAGNOSIS — I739 Peripheral vascular disease, unspecified: Secondary | ICD-10-CM

## 2021-07-21 DIAGNOSIS — R6889 Other general symptoms and signs: Secondary | ICD-10-CM

## 2021-07-21 DIAGNOSIS — N179 Acute kidney failure, unspecified: Secondary | ICD-10-CM

## 2021-07-21 DIAGNOSIS — N1831 Chronic kidney disease, stage 3a: Secondary | ICD-10-CM

## 2021-07-21 DIAGNOSIS — I2510 Atherosclerotic heart disease of native coronary artery without angina pectoris: Secondary | ICD-10-CM

## 2021-07-22 NOTE — Patient Instructions (Signed)
Visit Information   PATIENT GOALS:   Goals Addressed               This Visit's Progress     Reduce and Manage My Symptoms of Anxiety and Stress. (pt-stated)   On track     Timeframe:  Short-Term Goal Priority:  High Start Date:   07/21/2021                          Expected End Date:  09/20/2021                   Follow-Up Date:  07/26/2021 at 3:00pm  Patient Goals/Self-Care Activities:  Begin personal counseling with LCSW on a weekly/bi-weekly basis, to reduce and manage symptoms of Anxiety and Stress, until established with [place] or another community provider of interest. Incorporate into daily practice - relaxation techniques, deep breathing exercises and mindfulness meditation strategies. Talk about feelings with friends and family members. Continue with compliance of taking prescription medications, try to obtain adequate rest, stay well-hydrated and eat a healthy, well-balanced diet. E-mail specific questions that you would like addressed during your appointment with Dr. Isaias Cowman, Cardiologist with [place], and LCSW will submit directly to Dr. Saralyn Pilar for review and consideration, prior to your scheduled appointment on 07/28/2021 at 11:15am. E-mail blood pressure log to LCSW to submit directly to Primary Care Physician, Dr. Deborra Medina per her request.        Consent to CCM Services: Ms. Radu was given information about Chronic Care Management services including:  CCM service includes personalized support from designated clinical staff supervised by her physician, including individualized plan of care and coordination with other care providers 24/7 contact phone numbers for assistance for urgent and routine care needs. Service will only be billed when office clinical staff spend 20 minutes or more in a month to coordinate care. Only one practitioner may furnish and bill the service in a calendar month. The patient may stop CCM  services at any time (effective at the end of the month) by phone call to the office staff. The patient will be responsible for cost sharing (co-pay) of up to 20% of the service fee (after annual deductible is met).  Patient agreed to services and verbal consent obtained.   Patient verbalizes understanding of instructions provided today and agrees to view in [place].   Telephone follow up appointment with care management team member scheduled for:  07/26/2021 at 3:00pm  [place] Licensed Clinical Social Worker [place]  [phone]   CLINICAL CARE PLAN: Patient Care Plan: Medication Management     Problem Identified: Diabetes, CKD, HTN, CAD, PVD      Long-Range Goal: Disease Progression Prevention   Start Date: 05/12/2021  This Visit's Progress: On track  Recent Progress: On track  Priority: High  Note:   Current Barriers:  Suboptimal therapeutic regimen for diabetes, CKD , CAD  Pharmacist Clinical Goal(s):  Over the next 90 days, patient will achieve control of diabetes as evidenced by A1c  through collaboration with PharmD and provider.   Interventions: 1:1 collaboration with Crecencio Mc, MD regarding development and update of comprehensive plan of care as evidenced by provider attestation and co-signature Inter-disciplinary care team collaboration (see longitudinal plan of care) Comprehensive medication review performed; medication list updated in electronic medical record  SDOH: Lives alone. Reports concerns lately with stress, describes as episodes of "panic". Reports frequently over the  past week with stressors related to her cat's health. History of anxiety treated with various pharmacotherapy. Discussed role of LCSW on the team. Patient amenable to working with her. Will collaborate w/ Care Guide to outreach to schedule  Health Maintenance Yearly diabetic eye exam: up to date Yearly diabetic foot exam: due Urine microalbumin: up to  date Yearly influenza vaccination: due Td/Tdap vaccination: up to date Pneumonia vaccination: up to date COVID vaccinations: due for booster, will discuss later Shingrix vaccinations: due - will discuss moving forward  Diabetes in the setting of CKD, CAD Right at goal of A1c <7%; current treatment: none  Current glucose readings: not checking Considered starting SGLT2; however, will defer given recent bump in Scr and need to re-establish with nephrology. Will discuss moving forward.   Hypertension (CAD, s/p CABG 2006, PVD) in the setting of CKD Uncontrolled but anticipated to be improved; current treatment: isosorbide 60 mg daily, amlodipine 5 mg daily, metoprolol succinate 100 gm daily; follows w/ [facility] cardiology now Amlodipine started by cardiology yesterday. Patient has not picked up yet.  Acute rise in Scr (38%) with telmisartan initiation. Avoiding ARB. Will follow for impact of amlodipine addition on BP. Recommended to continue current regimen at this time [facility] last visit in 03/2020. Advised patient to call their office and schedule follow up, but let us know if a new referral is needed.   Hyperlipidemia, secondary ASCVD prevention: Lipids controlled at goal <70; current treatment: rosuvastatin 40 mg daily;  Medications previously tried: niacin, simvastatin Antiplatelet regimen: aspirin 81 mg daily Recommended to continue current regimen at this time  GERD/hx esophagitis: Appropriately managed; current regimen: omeprazole 20 mg daily  PPI therapy appropriate given hx esophagitis. Previously recommended to continue current regimen at this time  ?Atropic Vaginitis: Controlled per patient report; premarin cream vaginally PRN Previously recommend to continue current regimen at this time  Supplements: Vitamin D, Vitamin B12, Preservision AREDS 2, turmeric   Patient Goals/Self-Care Activities Over the next 90 days,  patient will:  - take medications as prescribed check blood pressure periodically, document, and provide at future appointments  Follow Up Plan: Telephone follow up appointment with care management team member scheduled for: ~ 8 weeks     Patient Care Plan: LCSW Plan of Care     Problem Identified: Reduce and Manage My Symptoms of Anxiety and Stress.   Priority: High     Goal: Reduce and Manage My Symptoms of Anxiety and Stress.   Start Date: 07/21/2021  Expected End Date: 09/20/2021  This Visit's Progress: On track  Priority: High  Note:   Current Barriers:   Acute Mental Health needs related to Coronary Artery Disease Involving Native Coronary Artery of Native Heart, Stage II Chronic Kidney Disease, Type 2 Diabetes Mellitus with Chronic Kidney Disease, Peripheral Vascular Disease, Pure Hypercholesterolemia, Anxiety and Stress of Chronic Illness requires Support, Education, Resources, Referrals and Care Coordination in order to meet unmet mental health needs. Clinical Goal(s):  Patient will work with LCSW to reduce and manage symptoms of Anxiety and Stress, until established with a community provider.   Patient will increase knowledge and/or ability of:        Coping Skills, Healthy Habits, Self-Management Skills, Stress Reduction, Home Safety and Utilizing [employer] and Resources.   Clinical Interventions:  Assessed patient's previous treatment, needs, coping skills, current treatment, support system and barriers to care. PHQ-2 and PHQ-9 Depression Screening Tool performed and results reviewed with patient. Other interventions  included:       Solution-Focused Therapy Performed, Mindfulness Meditation Strategies, Relaxation Techniques and Deep Breathing Exercises Encouraged, Active Listening/       Reflection Utilized, Emotional Support Provided, Problem Solving [place], Psychoeducation /Health Education, Motivational        Interviewing, Brief  Cognitive Behavioral Therapy Initiated, Reviewed Mental Health Medications and Discussed Compliance, Quality of Sleep Assessed and       Sleep Hygiene Techniques Promoted, Support Group Participation Encouraged, Increase Level of Activity/Exercise, Verbalization of Feelings Encouraged,        Crisis Resource Education/Information Provided, Suicidal Ideation/Homicidal Ideation Assessed - None Present.   Provided mental health counseling with regards to Anxiety and Stress.      Discussed plans with patient for ongoing care management follow-up and provided patient with direct contact information for care management team. Discussed several options for long-term counseling based on need and insurance, and encouraged consideration of referral to [facility] for ongoing mental health counseling and supportive services.  Collaboration with Primary Care Physician, Dr. Deborra Medina regarding development and update of comprehensive plan of care as evidenced by provider attestation and co-signature. Inter-disciplinary care team collaboration (see longitudinal plan of care). Patient Goals/Self-Care Activities: Begin personal counseling with LCSW on a weekly/bi-weekly basis, to reduce and manage symptoms of Anxiety and Stress, until established with [place] or another community provider of interest. Incorporate into daily practice - relaxation techniques, deep breathing exercises and mindfulness meditation strategies. Talk about feelings with friends and family members. Continue with compliance of taking prescription medications, try to obtain adequate rest, stay well-hydrated and eat a healthy, well-balanced diet. E-mail specific questions that you would like addressed during your appointment with Dr. Isaias Cowman, Cardiologist with [place], and LCSW will submit directly to Dr. Saralyn Pilar for review and consideration, prior to your scheduled appointment on 07/28/2021 at  11:15am. E-mail blood pressure log to LCSW to submit directly to Primary Care Physician, Dr. Deborra Medina per her request. Follow-Up:  07/26/2021 at 3:00pm

## 2021-07-22 NOTE — Chronic Care Management (AMB) (Signed)
Chronic Care Management    Clinical Social Work Note  07/22/2021 Name: Sabrina Montoya MRN: [id number] DOB: [birth year]  Sabrina Montoya is a 80 y.o. year old female who is a primary care patient of Derrel Nip, Aris Everts, MD. The CCM team was consulted to assist the patient with chronic disease management and/or care coordination needs related to: Mental Health Counseling and Resources.   Engaged with patient by telephone for initial visit in response to provider referral for social work chronic care management and care coordination services.   Consent to Services:  The patient was given information about Chronic Care Management services, agreed to services, and gave verbal consent prior to initiation of services.  Please see initial visit note for detailed documentation.   Patient agreed to services and consent obtained.   Assessment: Review of patient past medical history, allergies, medications, and health status, including review of relevant consultants reports was performed today as part of a comprehensive evaluation and provision of chronic care management and care coordination services.     SDOH (Social Determinants of Health) assessments and interventions performed:  SDOH Interventions    Flowsheet Row Most Recent Value  SDOH Interventions   Food Insecurity Interventions Intervention Not Indicated  Financial Strain Interventions Intervention Not Indicated  Housing Interventions Intervention Not Indicated  Intimate Partner Violence Interventions Intervention Not Indicated  Physical Activity Interventions Intervention Not Indicated, Patient Refused  Stress Interventions Intervention Not Indicated, Offered [employer], Provide Counseling, Patient Refused  ["I only agreed to one consultation with social work".]  Social Connections Interventions Intervention Not Indicated, Patient Refused  Transportation Interventions Intervention Not Indicated        Advanced Directives Status:  See Care Plan for related entries.  CCM Care Plan  Allergies  Allergen Reactions   Telmisartan Other (See Comments)    Acute renal failure   Niacin And Related Hives   Sertraline Hcl Other (See Comments)    Hallucinations,     Sulfa Drugs Cross Reactors Nausea Only   Tetracyclines & Related Hives   Latex Rash    RAST testing was NEGATIVE  for LATEX   Simvastatin Rash    *Antihyperlipidemics*; elevated LFT's.     Outpatient Encounter Medications as of 07/21/2021  Medication Sig   amLODipine (NORVASC) 5 MG tablet Take 5 mg by mouth daily. (Patient not taking: Reported on 07/08/2021)   aspirin 81 MG tablet Take 81 mg by mouth daily.   cholecalciferol (VITAMIN D) 1000 UNITS tablet Take 1,000 Units by mouth daily.   conjugated estrogens (PREMARIN) vaginal cream Place vaginally as needed.   Cyanocobalamin (VITAMIN B12 PO) Take 1 Dose by mouth daily. GUMMY CHEWS; 3,000 mg   methocarbamol (ROBAXIN) 500 MG tablet Take 500 mg by mouth at bedtime. (Patient not taking: Reported on 07/08/2021)   metoprolol succinate (TOPROL-XL) 100 MG 24 hr tablet Take 100 mg by mouth daily.   Multiple Vitamins-Minerals (PRESERVISION AREDS 2 PO) Take by mouth 2 (two) times daily.   nitroGLYCERIN (NITROSTAT) 0.4 MG SL tablet Place 1 tablet (0.4 mg total) under the tongue every 5 (five) minutes as needed for chest pain.   omeprazole (PRILOSEC) 20 MG capsule TAKE 1 CAPSULE(20 MG) BY MOUTH DAILY   rosuvastatin (CRESTOR) 40 MG tablet Take 1 tablet (40 mg total) by mouth daily.   Turmeric (QC TUMERIC COMPLEX PO) Take 1 mg by mouth daily.   No facility-administered encounter medications on file as of 07/21/2021.    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 05/05/2021   Extensor tenosynovitis of right  wrist 05/05/2021   Pain, dental 03/17/2021   Chronic thumb pain, right 05/15/2020   Syncope 12/31/2019   Arrhythmia, sinus node 05/29/2019   Fatigue 06/07/2018   Insomnia due to anxiety and fear 04/20/2018   Chronic bladder pain 12/23/2017   Irritable bowel syndrome (IBS) 12/23/2017   Atrophic vaginitis 02/11/2017   CKD (chronic kidney disease) stage 3, GFR 30-59 ml/min ([place]) 08/24/2016   Dysphagia, pharyngoesophageal phase 06/20/2016   Sciatica, left side 06/20/2016   Controlled type 2 diabetes with neuropathy ([place]) 05/16/2016   Preventative health care 02/06/2016   Cervicalgia 01/09/2015   Cystocele 08/29/2014   Acute renal failure ([place]) 05/02/2014   Anemia 05/02/2014   TIA (transient ischemic attack) 09/11/2013   Overweight (BMI 25.0-29.9) 03/16/2013   Bilateral carotid artery stenosis 03/03/2013   Ulnar neuropathy of left upper extremity 03/03/2013   Chronic reflux esophagitis 12/29/2012   Right hip pain 12/29/2012   History of tobacco abuse 07/03/2012   AAA (abdominal aortic aneurysm) without rupture (HCC)    CAD (coronary artery disease), autologous vein bypass graft    Peripheral vascular disease ([place])    Arthritis 02/29/2012   Hyperlipidemia    Osteopenia 08/29/2011   Vitamin D deficiency 08/29/2011    Conditions to be addressed/monitored: Anxiety and Stress of Chronic Illness.  Limited Social Support, Mental Health Concerns, Social Isolation, Limited Access to Caregiver, and Lacks Knowledge of [employer].  Care Plan : LCSW Plan of Care  Updates made by Francis Gaines, LCSW since 07/22/2021 12:00 AM     Problem: Reduce and Manage My Symptoms of Anxiety and Stress.   Priority: High     Goal: Reduce and Manage My Symptoms of Anxiety and Stress.   Start Date: 07/21/2021  Expected End Date: 09/20/2021  This Visit's Progress: On track  Priority: High  Note:   Current Barriers:   Acute Mental Health needs related to Coronary Artery Disease Involving  Native Coronary Artery of Native Heart, Stage II Chronic Kidney Disease, Type 2 Diabetes Mellitus with Chronic Kidney Disease, Peripheral Vascular Disease, Pure Hypercholesterolemia, Anxiety and Stress of Chronic Illness requires Support, Education, Resources, Referrals and Care Coordination in order to meet unmet mental health needs. Clinical Goal(s):  Patient will work with LCSW to reduce and manage symptoms of Anxiety and Stress, until established with a community provider.   Patient will increase knowledge and/or ability of:        Coping Skills, Healthy Habits, Self-Management Skills, Stress Reduction, Home Safety and Utilizing [employer] and Resources.   Clinical Interventions:  Assessed patient's previous treatment, needs, coping skills, current treatment, support system and barriers to care. PHQ-2 and PHQ-9 Depression Screening Tool performed and results reviewed with patient. Other interventions included:       Solution-Focused Therapy Performed, Mindfulness Meditation Strategies, Relaxation Techniques and Deep Breathing Exercises Encouraged, Active Listening/       Reflection Utilized, Emotional Support Provided, Problem Solving [place], Psychoeducation /Health Education, Motivational        Interviewing, Brief Cognitive Behavioral Therapy Initiated, Reviewed Mental Health Medications and Discussed Compliance, Quality of Sleep Assessed and       Sleep Hygiene Techniques Promoted, Support Group Participation Encouraged, Increase Level of Activity/Exercise, Verbalization of Feelings Encouraged,  Crisis Resource Education/Information Provided, Suicidal Ideation/Homicidal Ideation Assessed - None Present.   Provided mental health counseling with regards to Anxiety and Stress.      Discussed plans with patient for ongoing care management follow-up and provided patient with direct contact information for care management team. Discussed several options for  long-term counseling based on need and insurance, and encouraged consideration of referral to [facility] for ongoing mental health counseling and supportive services.  Collaboration with Primary Care Physician, Dr. Deborra Medina regarding development and update of comprehensive plan of care as evidenced by provider attestation and co-signature. Inter-disciplinary care team collaboration (see longitudinal plan of care). Patient Goals/Self-Care Activities: Begin personal counseling with LCSW on a weekly/bi-weekly basis, to reduce and manage symptoms of Anxiety and Stress, until established with [place] or another community provider of interest. Incorporate into daily practice - relaxation techniques, deep breathing exercises and mindfulness meditation strategies. Talk about feelings with friends and family members. Continue with compliance of taking prescription medications, try to obtain adequate rest, stay well-hydrated and eat a healthy, well-balanced diet. E-mail specific questions that you would like addressed during your appointment with Dr. Isaias Cowman, Cardiologist with [place], and LCSW will submit directly to Dr. Saralyn Pilar for review and consideration, prior to your scheduled appointment on 07/28/2021 at 11:15am. E-mail blood pressure log to LCSW to submit directly to Primary Care Physician, Dr. Deborra Medina per her request. Follow-Up:  07/26/2021 at 3:00pm        Follow-Up Plan:  07/26/2021 at 3:00pm      [place] Licensed Clinical Social Worker [place]  [phone]

## 2021-07-26 ENCOUNTER — Ambulatory Visit: Payer: Medicare Other | Admitting: *Deleted

## 2021-07-26 ENCOUNTER — Telehealth: Payer: Self-pay | Admitting: Internal Medicine

## 2021-07-26 DIAGNOSIS — N182 Chronic kidney disease, stage 2 (mild): Secondary | ICD-10-CM

## 2021-07-26 DIAGNOSIS — E1122 Type 2 diabetes mellitus with diabetic chronic kidney disease: Secondary | ICD-10-CM

## 2021-07-26 DIAGNOSIS — M8580 Other specified disorders of bone density and structure, unspecified site: Secondary | ICD-10-CM

## 2021-07-26 DIAGNOSIS — I2510 Atherosclerotic heart disease of native coronary artery without angina pectoris: Secondary | ICD-10-CM

## 2021-07-26 NOTE — Chronic Care Management (AMB) (Signed)
Chronic Care Management    Clinical Social Work Note  07/26/2021 Name: Sabrina Montoya MRN: [id number] DOB: [birth year]  Sabrina Montoya is a 80 y.o. year old female who is a primary care patient of Derrel Nip, Aris Everts, MD. The CCM team was consulted to assist the patient with chronic disease management and/or care coordination needs related to: Mental Health Counseling and Resources.   Engaged with patient by telephone for follow up visit in response to provider referral for social work chronic care management and care coordination services.   Consent to Services:  The patient was given information about Chronic Care Management services, agreed to services, and gave verbal consent prior to initiation of services.  Please see initial visit note for detailed documentation.   Patient agreed to services and consent obtained.   Assessment: Review of patient past medical history, allergies, medications, and health status, including review of relevant consultants reports was performed today as part of a comprehensive evaluation and provision of chronic care management and care coordination services.     SDOH (Social Determinants of Health) assessments and interventions performed:    Advanced Directives Status: Not addressed in this encounter.  CCM Care Plan  Allergies  Allergen Reactions   Telmisartan Other (See Comments)    Acute renal failure   Niacin And Related Hives   Sertraline Hcl Other (See Comments)    Hallucinations,     Sulfa Drugs Cross Reactors Nausea Only   Tetracyclines & Related Hives   Latex Rash    RAST testing was NEGATIVE  for LATEX   Simvastatin Rash    *Antihyperlipidemics*; elevated LFT's.     Outpatient Encounter Medications as of 07/26/2021  Medication Sig   amLODipine (NORVASC) 5 MG tablet Take 5 mg by mouth daily. (Patient not taking: Reported on 07/08/2021)   aspirin 81 MG tablet Take 81 mg by mouth daily.   cholecalciferol (VITAMIN D) 1000 UNITS tablet Take 1,000 Units  by mouth daily.   conjugated estrogens (PREMARIN) vaginal cream Place vaginally as needed.   Cyanocobalamin (VITAMIN B12 PO) Take 1 Dose by mouth daily. GUMMY CHEWS; 3,000 mg   isosorbide mononitrate (IMDUR) 60 MG 24 hr tablet Take 1 tablet (60 mg total) by mouth daily.   methocarbamol (ROBAXIN) 500 MG tablet Take 500 mg by mouth at bedtime. (Patient not taking: Reported on 07/08/2021)   metoprolol succinate (TOPROL-XL) 100 MG 24 hr tablet Take 100 mg by mouth daily.   Multiple Vitamins-Minerals (PRESERVISION AREDS 2 PO) Take by mouth 2 (two) times daily.   nitroGLYCERIN (NITROSTAT) 0.4 MG SL tablet Place 1 tablet (0.4 mg total) under the tongue every 5 (five) minutes as needed for chest pain.   omeprazole (PRILOSEC) 20 MG capsule TAKE 1 CAPSULE(20 MG) BY MOUTH DAILY   rosuvastatin (CRESTOR) 40 MG tablet Take 1 tablet (40 mg total) by mouth daily.   Turmeric (QC TUMERIC COMPLEX PO) Take 1 mg by mouth daily.   No facility-administered encounter medications on file as of 07/26/2021.    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 05/05/2021   Extensor tenosynovitis of right wrist 05/05/2021   Pain, dental 03/17/2021   Chronic thumb pain, right 05/15/2020   Syncope 12/31/2019   Arrhythmia, sinus node 05/29/2019   Fatigue 06/07/2018   Insomnia due to  anxiety and fear 04/20/2018   Chronic bladder pain 12/23/2017   Irritable bowel syndrome (IBS) 12/23/2017   Atrophic vaginitis 02/11/2017   CKD (chronic kidney disease) stage 3, GFR 30-59 ml/min (HCC) 08/24/2016   Dysphagia, pharyngoesophageal phase 06/20/2016   Sciatica, left side 06/20/2016   Controlled type 2 diabetes with neuropathy ([place]) 05/16/2016   Preventative health care 02/06/2016   Cervicalgia 01/09/2015   Cystocele  08/29/2014   Acute renal failure ([place]) 05/02/2014   Anemia 05/02/2014   TIA (transient ischemic attack) 09/11/2013   Overweight (BMI 25.0-29.9) 03/16/2013   Bilateral carotid artery stenosis 03/03/2013   Ulnar neuropathy of left upper extremity 03/03/2013   Chronic reflux esophagitis 12/29/2012   Right hip pain 12/29/2012   History of tobacco abuse 07/03/2012   AAA (abdominal aortic aneurysm) without rupture (HCC)    CAD (coronary artery disease), autologous vein bypass graft    Peripheral vascular disease ([place])    Arthritis 02/29/2012   Hyperlipidemia    Osteopenia 08/29/2011   Vitamin D deficiency 08/29/2011    Conditions to be addressed/monitored: Anxiety and Depression.  Limited Social Support, Mental Health Concerns, Social Isolation, and Lacks Knowledge of [employer].  Care Plan : LCSW Plan of Care  Updates made by Francis Gaines, LCSW since 07/26/2021 12:00 AM     Problem: Reduce and Manage My Symptoms of Anxiety and Stress.   Priority: High     Goal: Reduce and Manage My Symptoms of Anxiety and Stress.   Start Date: 07/21/2021  Expected End Date: 09/20/2021  This Visit's Progress: On track  Recent Progress: On track  Priority: High  Note:   Current Barriers:   Acute Mental Health needs related to Coronary Artery Disease Involving Native Coronary Artery of Native Heart, Stage II Chronic Kidney Disease, Type 2 Diabetes Mellitus with Chronic Kidney Disease, Peripheral Vascular Disease, Pure Hypercholesterolemia, Anxiety and Stress of Chronic Illness requires Support, Education, Resources, Referrals and Care Coordination in order to meet unmet mental health needs. Clinical Goal(s):  Patient will work with LCSW to reduce and manage symptoms of Anxiety and Stress, until established with a community provider.   Patient will increase knowledge and/or ability of:        Coping Skills, Self-Management Skills and Stress Reduction.     Clinical Interventions:   Solution-Focused Therapy Performed and Verbalization of Feelings Encouraged.   Collaboration with Primary Care Physician, Dr. Deborra Medina regarding development and update of comprehensive plan of care as evidenced by provider attestation and co-signature. Patient Goals/Self-Care Activities: Continue to receive personal counseling with LCSW on a weekly/bi-weekly basis, to reduce and manage symptoms of Anxiety and Stress, until established with [place] or another community provider of interest. Collaboration with Primary Care Physician, Dr. Deborra Medina to provide a copy of your blood pressure log.   Collaboration with Cardiologist, Dr. Isaias Cowman to provide a list of specific questions.   Contact LCSW directly (# [id number]) if you have questions, need assistance, or if additional social work needs are identified between now and our next scheduled telephone outreach call. Follow-Up:  08/09/2021 at 9:45am        Follow-Up Plan:  08/09/2021 at 9:45am      [place] Clinical Social Worker [place]  [phone]

## 2021-07-26 NOTE — Telephone Encounter (Signed)
[facility] calling in to state that they faxed over a request for medical records. States they can see these in Epic and will not be needing this request for paper records.   Okay to shred request when received.

## 2021-07-26 NOTE — Telephone Encounter (Signed)
Request has been shredded.

## 2021-07-26 NOTE — Patient Instructions (Signed)
Visit Information  PATIENT GOALS:  Goals Addressed               This Visit's Progress     Reduce and Manage My Symptoms of Anxiety and Stress. (pt-stated)   On track     Timeframe:  Short-Term Goal Priority:  High Start Date:   07/21/2021                          Expected End Date:  09/20/2021                   Follow-Up Date:  08/09/2021 at 9:45am  Patient Goals/Self-Care Activities: Continue to receive personal counseling with LCSW on a weekly/bi-weekly basis, to reduce and manage symptoms of Anxiety and Stress, until established with [place] or another community provider of interest. Collaboration with Primary Care Physician, Dr. Deborra Medina to provide a copy of your blood pressure log.   Collaboration with Cardiologist, Dr. Isaias Cowman to provide a list of specific questions.   Contact LCSW directly (# [id number]) if you have questions, need assistance, or if additional social work needs are identified between now and our next scheduled telephone outreach call.        Patient verbalizes understanding of instructions provided today and agrees to view in [place].   Telephone follow up appointment with care management team member scheduled for:  08/09/2021 at 9:45am  [place] Licensed Clinical Social Worker [place]  [phone]

## 2021-07-28 DIAGNOSIS — Z23 Encounter for immunization: Secondary | ICD-10-CM | POA: Diagnosis not present

## 2021-07-28 DIAGNOSIS — F17200 Nicotine dependence, unspecified, uncomplicated: Secondary | ICD-10-CM | POA: Diagnosis not present

## 2021-07-28 DIAGNOSIS — I714 Abdominal aortic aneurysm, without rupture: Secondary | ICD-10-CM | POA: Diagnosis not present

## 2021-07-28 DIAGNOSIS — I25119 Atherosclerotic heart disease of native coronary artery with unspecified angina pectoris: Secondary | ICD-10-CM | POA: Diagnosis not present

## 2021-07-28 DIAGNOSIS — Z9889 Other specified postprocedural states: Secondary | ICD-10-CM | POA: Diagnosis not present

## 2021-07-28 DIAGNOSIS — R002 Palpitations: Secondary | ICD-10-CM | POA: Diagnosis not present

## 2021-07-28 DIAGNOSIS — I2510 Atherosclerotic heart disease of native coronary artery without angina pectoris: Secondary | ICD-10-CM | POA: Diagnosis not present

## 2021-07-28 DIAGNOSIS — I10 Essential (primary) hypertension: Secondary | ICD-10-CM | POA: Diagnosis not present

## 2021-07-28 DIAGNOSIS — I6523 Occlusion and stenosis of bilateral carotid arteries: Secondary | ICD-10-CM | POA: Diagnosis not present

## 2021-08-05 DIAGNOSIS — N1831 Chronic kidney disease, stage 3a: Secondary | ICD-10-CM

## 2021-08-05 DIAGNOSIS — M151 Heberden's nodes (with arthropathy): Secondary | ICD-10-CM

## 2021-08-05 DIAGNOSIS — N179 Acute kidney failure, unspecified: Secondary | ICD-10-CM

## 2021-08-05 DIAGNOSIS — G459 Transient cerebral ischemic attack, unspecified: Secondary | ICD-10-CM

## 2021-08-05 DIAGNOSIS — E1122 Type 2 diabetes mellitus with diabetic chronic kidney disease: Secondary | ICD-10-CM | POA: Diagnosis not present

## 2021-08-05 DIAGNOSIS — N182 Chronic kidney disease, stage 2 (mild): Secondary | ICD-10-CM

## 2021-08-05 DIAGNOSIS — E7800 Pure hypercholesterolemia, unspecified: Secondary | ICD-10-CM | POA: Diagnosis not present

## 2021-08-05 DIAGNOSIS — I2510 Atherosclerotic heart disease of native coronary artery without angina pectoris: Secondary | ICD-10-CM | POA: Diagnosis not present

## 2021-08-09 ENCOUNTER — Ambulatory Visit (INDEPENDENT_AMBULATORY_CARE_PROVIDER_SITE_OTHER): Payer: Medicare Other | Admitting: *Deleted

## 2021-08-09 DIAGNOSIS — E1122 Type 2 diabetes mellitus with diabetic chronic kidney disease: Secondary | ICD-10-CM

## 2021-08-09 DIAGNOSIS — N179 Acute kidney failure, unspecified: Secondary | ICD-10-CM

## 2021-08-09 DIAGNOSIS — I2510 Atherosclerotic heart disease of native coronary artery without angina pectoris: Secondary | ICD-10-CM

## 2021-08-09 DIAGNOSIS — N182 Chronic kidney disease, stage 2 (mild): Secondary | ICD-10-CM

## 2021-08-09 NOTE — Patient Instructions (Signed)
Visit Information  PATIENT GOALS:  Goals Addressed               This Visit's Progress     Reduce and Manage My Symptoms of Anxiety and Stress. (pt-stated)   On track     Timeframe:  Short-Term Goal Priority:  High Start Date:   07/21/2021                          Expected End Date:  09/20/2021                   Follow-Up Date:  08/30/2021 at 11:00am  Patient Goals/Self-Care Activities: Continue to receive personal counseling with LCSW, on a weekly/bi-weekly basis, to reduce and manage symptoms of Anxiety and Stress, since declining [place] for ongoing mental health counseling and supportive services.   LCSW collaboration with Angela Nevin, Cardiac Rehabilitation at [facility] [phone]), to assist in establishing rehabilitation services.   Please contact Carla at your earliest convenience to schedule your initial assessment. Contact LCSW directly (# [id number]) if you have questions, need assistance, or if additional social work needs are identified between now and our next scheduled telephone outreach call.        Patient verbalizes understanding of instructions provided today and agrees to view in [place].   Telephone follow up appointment with care management team member scheduled for:  08/30/2021 at 11:00am  [place] Licensed Clinical Social Worker [place]  [phone]

## 2021-08-09 NOTE — Chronic Care Management (AMB) (Signed)
Chronic Care Management    Clinical Social Work Note  08/09/2021 Name: Sabrina Montoya MRN: [id number] DOB: 1940/12/14  Sabrina Montoya is a 80 y.o. year old female who is a primary care patient of Derrel Nip, Aris Everts, MD. The CCM team was consulted to assist the patient with chronic disease management and/or care coordination needs related to: Appointment Scheduling Needs and [place] and Resources.   Engaged with patient by telephone for follow-up visit in response to provider referral for social work chronic care management and care coordination services.   Consent to Services:  The patient was given information about Chronic Care Management services, agreed to services, and gave verbal consent prior to initiation of services.  Please see initial visit note for detailed documentation.   Patient agreed to services and consent obtained.   Assessment: Review of patient past medical history, allergies, medications, and health status, including review of relevant consultants reports was performed today as part of a comprehensive evaluation and provision of chronic care management and care coordination services.     SDOH (Social Determinants of Health) assessments and interventions performed:    Advanced Directives Status: Not addressed in this encounter.  CCM Care Plan  Allergies  Allergen Reactions   Telmisartan Other (See Comments)    Acute renal failure   Niacin And Related Hives   Sertraline Hcl Other (See Comments)    Hallucinations,     Sulfa Drugs Cross Reactors Nausea Only   Tetracyclines & Related Hives   Latex Rash    RAST testing was NEGATIVE  for LATEX   Simvastatin Rash    *Antihyperlipidemics*; elevated LFT's.     Outpatient Encounter Medications as of 08/09/2021  Medication Sig   amLODipine (NORVASC) 5 MG tablet Take 5 mg by mouth daily. (Patient not taking: Reported on 07/08/2021)   aspirin 81 MG tablet Take 81 mg by mouth daily.   cholecalciferol (VITAMIN D)  1000 UNITS tablet Take 1,000 Units by mouth daily.   conjugated estrogens (PREMARIN) vaginal cream Place vaginally as needed.   Cyanocobalamin (VITAMIN B12 PO) Take 1 Dose by mouth daily. GUMMY CHEWS; 3,000 mg   isosorbide mononitrate (IMDUR) 60 MG 24 hr tablet Take 1 tablet (60 mg total) by mouth daily.   methocarbamol (ROBAXIN) 500 MG tablet Take 500 mg by mouth at bedtime. (Patient not taking: Reported on 07/08/2021)   metoprolol succinate (TOPROL-XL) 100 MG 24 hr tablet Take 100 mg by mouth daily.   Multiple Vitamins-Minerals (PRESERVISION AREDS 2 PO) Take by mouth 2 (two) times daily.   nitroGLYCERIN (NITROSTAT) 0.4 MG SL tablet Place 1 tablet (0.4 mg total) under the tongue every 5 (five) minutes as needed for chest pain.   omeprazole (PRILOSEC) 20 MG capsule TAKE 1 CAPSULE(20 MG) BY MOUTH DAILY   rosuvastatin (CRESTOR) 40 MG tablet Take 1 tablet (40 mg total) by mouth daily.   Turmeric (QC TUMERIC COMPLEX PO) Take 1 mg by mouth daily.   No facility-administered encounter medications on file as of 08/09/2021.    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 05/05/2021   Extensor tenosynovitis of right wrist 05/05/2021   Pain, dental 03/17/2021   Chronic thumb pain, right 05/15/2020   Syncope 12/31/2019   Arrhythmia, sinus node 05/29/2019   Fatigue 06/07/2018  Insomnia due to anxiety and fear 04/20/2018   Chronic bladder pain 12/23/2017   Irritable bowel syndrome (IBS) 12/23/2017   Atrophic vaginitis 02/11/2017   CKD (chronic kidney disease) stage 3, GFR 30-59 ml/min (HCC) 08/24/2016   Dysphagia, pharyngoesophageal phase 06/20/2016   Sciatica, left side 06/20/2016   Controlled type 2 diabetes with neuropathy ([place]) 05/16/2016   Preventative health care 02/06/2016    Cervicalgia 01/09/2015   Cystocele 08/29/2014   Acute renal failure ([place]) 05/02/2014   Anemia 05/02/2014   TIA (transient ischemic attack) 09/11/2013   Overweight (BMI 25.0-29.9) 03/16/2013   Bilateral carotid artery stenosis 03/03/2013   Ulnar neuropathy of left upper extremity 03/03/2013   Chronic reflux esophagitis 12/29/2012   Right hip pain 12/29/2012   History of tobacco abuse 07/03/2012   AAA (abdominal aortic aneurysm) without rupture (HCC)    CAD (coronary artery disease), autologous vein bypass graft    Peripheral vascular disease ([place])    Arthritis 02/29/2012   Hyperlipidemia    Osteopenia 08/29/2011   Vitamin D deficiency 08/29/2011    Conditions to be addressed/monitored: Anxiety and Depression.  Limited Social Support, Mental Health Concerns and Social Isolation.  Care Plan : LCSW Plan of Care  Updates made by Francis Gaines, LCSW since 08/09/2021 12:00 AM     Problem: Reduce and Manage My Symptoms of Anxiety and Stress.   Priority: High     Goal: Reduce and Manage My Symptoms of Anxiety and Stress.   Start Date: 07/21/2021  Expected End Date: 09/20/2021  This Visit's Progress: On track  Recent Progress: On track  Priority: High  Note:   Current Barriers:   Acute Mental Health needs related to Coronary Artery Disease Involving Native Coronary Artery of Native Heart, Stage II Chronic Kidney Disease, Type 2 Diabetes Mellitus with Chronic Kidney Disease, Peripheral Vascular Disease, Pure Hypercholesterolemia, Anxiety and Stress of Chronic Illness requires Support, Education, Resources, Referrals and Care Coordination in order to meet unmet mental health needs. Clinical Goal(s):  Patient will work with LCSW to reduce and manage symptoms of Anxiety and Stress, until established with a community provider.   Patient will increase knowledge and/or ability of:  Coping Skills, Self-Management Skills and Stress Reduction.     Clinical Interventions:  Solution-Focused  Therapy Performed and Verbalization of Feelings Encouraged.   Collaboration with Primary Care Physician, Dr. Deborra Medina regarding development and update of comprehensive plan of care as evidenced by provider attestation and co-signature. Patient Goals/Self-Care Activities: Continue to receive personal counseling with LCSW, on a weekly/bi-weekly basis, to reduce and manage symptoms of Anxiety and Stress, since declining [place] for ongoing mental health counseling and supportive services.   LCSW collaboration with Angela Nevin, Cardiac Rehabilitation at [facility] [phone]), to assist in establishing rehabilitation services.   Please contact Carla at your earliest convenience to schedule your initial assessment. Contact LCSW directly (# [id number]) if you have questions, need assistance, or if additional social work needs are identified between now and our next scheduled telephone outreach call. Follow-Up:  08/30/2021 at 11:00am       [place] Licensed Clinical Social Worker [place]  [phone]

## 2021-08-15 DIAGNOSIS — H353132 Nonexudative age-related macular degeneration, bilateral, intermediate dry stage: Secondary | ICD-10-CM | POA: Diagnosis not present

## 2021-08-18 ENCOUNTER — Ambulatory Visit (INDEPENDENT_AMBULATORY_CARE_PROVIDER_SITE_OTHER): Payer: Medicare Other

## 2021-08-18 ENCOUNTER — Ambulatory Visit (INDEPENDENT_AMBULATORY_CARE_PROVIDER_SITE_OTHER): Payer: Medicare Other | Admitting: Internal Medicine

## 2021-08-18 ENCOUNTER — Encounter: Payer: Self-pay | Admitting: Internal Medicine

## 2021-08-18 ENCOUNTER — Other Ambulatory Visit: Payer: Self-pay

## 2021-08-18 ENCOUNTER — Encounter: Payer: Medicare Other | Attending: Internal Medicine

## 2021-08-18 VITALS — BP 122/68 | HR 58 | Temp 96.4°F | Ht 62.0 in | Wt 154.6 lb

## 2021-08-18 DIAGNOSIS — M542 Cervicalgia: Secondary | ICD-10-CM | POA: Diagnosis not present

## 2021-08-18 DIAGNOSIS — Z23 Encounter for immunization: Secondary | ICD-10-CM

## 2021-08-18 DIAGNOSIS — I6523 Occlusion and stenosis of bilateral carotid arteries: Secondary | ICD-10-CM

## 2021-08-18 DIAGNOSIS — N1831 Chronic kidney disease, stage 3a: Secondary | ICD-10-CM | POA: Diagnosis not present

## 2021-08-18 DIAGNOSIS — M5412 Radiculopathy, cervical region: Secondary | ICD-10-CM | POA: Diagnosis not present

## 2021-08-18 DIAGNOSIS — N179 Acute kidney failure, unspecified: Secondary | ICD-10-CM | POA: Diagnosis not present

## 2021-08-18 DIAGNOSIS — I214 Non-ST elevation (NSTEMI) myocardial infarction: Secondary | ICD-10-CM

## 2021-08-18 MED ORDER — PREDNISONE 10 MG PO TABS: ORAL_TABLET | ORAL | 0 refills | Status: DC

## 2021-08-18 NOTE — Patient Instructions (Addendum)
I believe your pain is coming from a pinched nerve in your upper cervical spine (C3,  above your previous surgery area)  Take 2000 mg of acetominophen (tylenol) every day  In divided doses (500 mg every 6 hours  Or 1000 mg every 12 hours.)   Start using robaxin every 6 hours.  As your muscle relaxer    Continue Ice alternating with heat. When resting    15 minutes of each   If your x rays suggest a worsening of cervical disk disease,  I will order an MRI  Start the prednisone if not better in 48 hours

## 2021-08-18 NOTE — Progress Notes (Signed)
Subjective:  Patient ID: Sabrina Montoya, female    DOB: Jul 25, 1941  Age: 80 y.o. MRN: [id number]  CC: The primary encounter diagnosis was Cervical radiculitis. Diagnoses of Need for immunization against influenza, Acute renal failure, unspecified acute renal failure type ([place]), and Stage 3a chronic kidney disease ([place]) were also pertinent to this visit.  HPI Sabrina Montoya presents for  follow up on multiple issues.  Chief Complaint  Patient presents with   Follow-up    Follow up on left side ear and neck pain. Pain has increased and now radiating into scalp. Scalp is sore to the touch. Pain level is a 5.   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  Renal artery stenosis ruled out.  She has started amlodipine and has been referred to nephrology .   Left sided neck pain  starts behind the left ear .  Involves the trapezius muscle and will radiate sharply to the left side of her scalp .  Aggravated by head turning .  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  Readings have been for the most part < 140/80 at rest . Patient is following a reduce salt diet most days and is taking amlodipine , metoprolol and and Imdur  as prescribed    Outpatient Medications Prior to Visit  Medication Sig Dispense Refill   amLODipine (NORVASC) 5 MG tablet Take by mouth.     aspirin 81 MG tablet Take 81 mg by mouth daily.     cholecalciferol (VITAMIN D) 1000 UNITS tablet Take 1,000 Units by mouth daily.     Cyanocobalamin (VITAMIN B12 PO) Take 1 Dose by mouth daily. GUMMY CHEWS; 3,000 mg     isosorbide mononitrate (IMDUR) 30 MG 24 hr tablet Take 30 mg by mouth daily.     methocarbamol (ROBAXIN) 500 MG tablet Take 500 mg by mouth at bedtime.     methocarbamol (ROBAXIN) 500 MG tablet at bedtime     metoprolol succinate (TOPROL-XL) 100 MG 24 hr tablet Take by mouth.     Multiple Vitamins-Minerals  (PRESERVISION AREDS 2 PO) Take by mouth 2 (two) times daily.     nitroGLYCERIN (NITROSTAT) 0.4 MG SL tablet Place 1 tablet (0.4 mg total) under the tongue every 5 (five) minutes as needed for chest pain. 50 tablet 3   omeprazole (PRILOSEC) 20 MG capsule TAKE 1 CAPSULE(20 MG) BY MOUTH DAILY 90 capsule 1   rosuvastatin (CRESTOR) 40 MG tablet Take 1 tablet (40 mg total) by mouth daily. 90 tablet 0   Turmeric (QC TUMERIC COMPLEX PO) Take 1 mg by mouth daily.     amLODipine (NORVASC) 5 MG tablet Take 5 mg by mouth daily. (Patient not taking: No sig reported)     conjugated estrogens (PREMARIN) vaginal cream Place vaginally as needed. (Patient not taking: No sig reported)     cyanocobalamin 1000 MCG tablet Take by mouth. (Patient not taking: No sig reported)     isosorbide mononitrate (IMDUR) 60 MG 24 hr tablet Take 1 tablet (60 mg total) by mouth daily. 30 tablet 0   metoprolol succinate (TOPROL-XL) 100 MG 24 hr tablet Take 100 mg by mouth daily. (Patient not taking: No sig reported)     No facility-administered medications prior to visit.    Review of Systems;  Patient denies headache, fevers, malaise, unintentional weight loss, skin  rash, eye pain, sinus congestion and sinus pain, sore throat, dysphagia,  hemoptysis , cough, dyspnea, wheezing, chest pain, palpitations, orthopnea, edema, abdominal pain, nausea, melena, diarrhea, constipation, flank pain, dysuria, hematuria, urinary  Frequency, nocturia, numbness, tingling, seizures,  Focal weakness, Loss of consciousness,  Tremor, insomnia, depression, anxiety, and suicidal ideation.      Objective:  BP 122/68 (BP Location: Left Arm, Patient Position: Sitting, Cuff Size: Normal)   Pulse (!) 58   Temp (!) 96.4 F (35.8 C) (Temporal)   Ht 5\' 2"  (1.575 m)   Wt 154 lb 9.6 oz (70.1 kg)   SpO2 99%   BMI 28.28 kg/m   BP Readings from Last 3 Encounters:  08/18/21 122/68  06/30/21 116/68  06/08/21 (!) 141/66    Wt Readings from Last 3  Encounters:  08/18/21 154 lb 9.6 oz (70.1 kg)  06/30/21 151 lb 9.6 oz (68.8 kg)  06/08/21 150 lb 8 oz (68.3 kg)    General appearance: alert, cooperative and appears stated age Ears: normal TM's and external ear canals both ears Throat: lips, mucosa, and tongue normal; teeth and gums normal Neck: no adenopathy, no carotid bruit.  Left trapezium muscle in spasm.  trachea midline and thyroid not enlarged, symmetric, no tenderness/mass/nodules Back: symmetric, no curvature. ROM normal. No CVA tenderness. Lungs: clear to auscultation bilaterally Heart: regular rate and rhythm, S1, S2 normal, no murmur, click, rub or gallop Abdomen: soft, non-tender; bowel sounds normal; no masses,  no organomegaly Pulses: 2+ and symmetric Skin: Skin color, texture, turgor normal. No rashes or lesions Lymph nodes: Cervical, supraclavicular, and axillary nodes normal.  Lab Results  Component Value Date   HGBA1C 7.0 (H) 06/06/2021   HGBA1C 7.3 (H) 04/27/2021   HGBA1C 6.6 (H) 05/13/2020    Lab Results  Component Value Date   CREATININE 1.36 (H) 08/18/2021   CREATININE 1.07 07/12/2021   CREATININE 1.24 (H) 06/30/2021    Lab Results  Component Value Date   WBC 4.7 08/18/2021   HGB 10.2 (L) 08/18/2021   HCT 30.6 (L) 08/18/2021   PLT 185.0 08/18/2021   GLUCOSE 164 (H) 08/18/2021   CHOL 116 06/06/2021   TRIG 122 06/06/2021   HDL 44 06/06/2021   LDLDIRECT 48.0 04/18/2018   LDLCALC 48 06/06/2021   ALT 13 08/18/2021   AST 30 08/18/2021   NA 142 08/18/2021   K 5.0 08/18/2021   CL 107 08/18/2021   CREATININE 1.36 (H) 08/18/2021   BUN 32 (H) 08/18/2021   CO2 27 08/18/2021   TSH 1.64 06/02/2019   INR 1.1 06/06/2021   HGBA1C 7.0 (H) 06/06/2021   MICROALBUR 2.5 (H) 04/27/2021    CARDIAC CATHETERIZATION  Result Date: 06/07/2021 Formatting of this result is different from the original.   Mid RCA lesion is 100% stenosed.   Prox Cx to Mid Cx lesion is 100% stenosed.   Ost Cx to Prox Cx lesion is 95%  stenosed.   Ost LM lesion is 30% stenosed.   Ost LAD lesion is 100% stenosed.   Origin lesion is 100% stenosed.   The left ventricular systolic function is normal. 1.  Non-ST elevation myocardial infarction 2.  Severe three-vessel coronary artery disease with chronically occluded ostial LAD, chronically occluded left circumflex, moderate to heavily calcified 95% stenosis proximal left circumflex, chronically occluded nondominant mid RCA 3.  Patent LIMA to distal LAD, patent SVG to left PDA with retrograde flow to proximal/mid left circumflex and OM1, chronically occluded SVG to OM1 4.  Preserved left  ventricular function, with LVEF greater than 55% Recommendations 1.  Medical therapy 2.  Uptitrate isosorbide mononitrate to 60 mg daily 3.  Add metoprolol succinate 25 mg daily 4.  May discharge home later today 5.  Follow-up with Dr. Harle [facility]  ECHOCARDIOGRAM COMPLETE  Result Date: 06/06/2021    ECHOCARDIOGRAM REPORT   Patient Name:   Sabrina Montoya Date of Exam: 06/06/2021 Medical Rec #:  [id number]  Height:       63.0 in Accession #:    [id number] Weight:       159.4 lb Date of Birth:  10-05-1941  BSA:          1.756 m Patient Age:    54 years   BP:           124/51 mmHg Patient Gender: F          HR:           59 bpm. Exam Location:  [facility] Procedure: 2D Echo, Color Doppler, Cardiac Doppler and Intracardiac            Opacification Agent Indications:     R07.9 Chest Pain  History:         Patient has no prior history of Echocardiogram examinations.                  CAD, Prior CABG, CKD; Risk Factors:Hypertension, Dyslipidemia                  and Current Smoker.  Sonographer:     Charmayne Sheer RDCS (AE) Referring Phys:  [id number] Candace Gallus MELVIN Diagnosing Phys: Isaias Cowman MD  Sonographer Comments: Suboptimal apical window and suboptimal subcostal window. IMPRESSIONS  1. Left ventricular ejection fraction, by estimation, is 60 to 65%. The left ventricle has normal function. Left  ventricular endocardial border not optimally defined to evaluate regional wall motion. Left ventricular diastolic parameters are consistent with Grade I diastolic dysfunction (impaired relaxation).  2. Right ventricular systolic function is normal. The right ventricular size is normal.  3. The mitral valve is normal in structure. Mild mitral valve regurgitation. No evidence of mitral stenosis.  4. The aortic valve is normal in structure. Aortic valve regurgitation is trivial. No aortic stenosis is present.  5. The inferior vena cava is normal in size with greater than 50% respiratory variability, suggesting right atrial pressure of 3 mmHg. FINDINGS  Left Ventricle: Left ventricular ejection fraction, by estimation, is 60 to 65%. The left ventricle has normal function. Left ventricular endocardial border not optimally defined to evaluate regional wall motion. Definity contrast agent was given IV to delineate the left ventricular endocardial borders. The left ventricular internal cavity size was normal in size. There is no left ventricular hypertrophy. Left ventricular diastolic parameters are consistent with Grade I diastolic dysfunction (impaired relaxation). Right Ventricle: The right ventricular size is normal. No increase in right ventricular wall thickness. Right ventricular systolic function is normal. Left Atrium: Left atrial size was normal in size. Right Atrium: Right atrial size was normal in size. Pericardium: There is no evidence of pericardial effusion. Mitral Valve: The mitral valve is normal in structure. Mild mitral valve regurgitation. No evidence of mitral valve stenosis. MV peak gradient, 5.3 mmHg. The mean mitral valve gradient is 2.0 mmHg. Tricuspid Valve: The tricuspid valve is normal in structure. Tricuspid valve regurgitation is mild . No evidence of tricuspid stenosis. Aortic Valve: The aortic valve is normal in structure. Aortic valve regurgitation is trivial.  No aortic stenosis is present.  Aortic valve mean gradient measures 6.0 mmHg. Aortic valve peak gradient measures 10.8 mmHg. Aortic valve area, by VTI measures 1.44 cm. Pulmonic Valve: The pulmonic valve was normal in structure. Pulmonic valve regurgitation is not visualized. No evidence of pulmonic stenosis. Aorta: The aortic root is normal in size and structure. Venous: The inferior vena cava is normal in size with greater than 50% respiratory variability, suggesting right atrial pressure of 3 mmHg. IAS/Shunts: No atrial level shunt detected by color flow Doppler.  LEFT VENTRICLE PLAX 2D LVIDd:         4.10 cm  Diastology LVIDs:         2.50 cm  LV e' medial:    6.20 cm/s LV PW:         0.90 cm  LV E/e' medial:  13.3 LV IVS:        0.80 cm  LV e' lateral:   7.83 cm/s LVOT diam:     1.90 cm  LV E/e' lateral: 10.5 LV SV:         49 LV SV Index:   28 LVOT Area:     2.84 cm  RIGHT VENTRICLE RV Basal diam:  2.70 cm LEFT ATRIUM           Index       RIGHT ATRIUM          Index LA diam:      3.20 cm 1.82 cm/m  RA Area:     8.19 cm LA Vol (A4C): 22.1 ml 12.59 ml/m RA Volume:   15.20 ml 8.66 ml/m  AORTIC VALVE                    PULMONIC VALVE AV Area (Vmax):    1.60 cm     PV Vmax:       1.01 m/s AV Area (Vmean):   1.66 cm     PV Vmean:      70.600 cm/s AV Area (VTI):     1.44 cm     PV VTI:        0.208 m AV Vmax:           164.00 cm/s  PV Peak grad:  4.1 mmHg AV Vmean:          118.000 cm/s PV Mean grad:  2.0 mmHg AV VTI:            0.340 m AV Peak Grad:      10.8 mmHg AV Mean Grad:      6.0 mmHg LVOT Vmax:         92.70 cm/s LVOT Vmean:        69.200 cm/s LVOT VTI:          0.173 m LVOT/AV VTI ratio: 0.51  AORTA Ao Root diam: 3.00 cm MITRAL VALVE MV Area (PHT): 2.65 cm     SHUNTS MV Area VTI:   1.46 cm     Systemic VTI:  0.17 m MV Peak grad:  5.3 mmHg     Systemic Diam: 1.90 cm MV Mean grad:  2.0 mmHg MV Vmax:       1.15 m/s MV Vmean:      67.3 cm/s MV Decel Time: 286 msec MV E velocity: 82.30 cm/s MV A velocity: 101.00 cm/s MV E/A ratio:   0.81 Isaias Cowman MD Electronically signed by Isaias Cowman MD Signature Date/Time: 06/06/2021/1:46:41 PM    Final     Assessment &  Plan:   Problem List Items Addressed This Visit       Unprioritized   Cervical radiculitis - Primary    Radiation of pain to the C3 distribution area (left parietal scalp).  Plain films ordered.  Muscle relaxer and tylenol recommended.  Add prednisone if no improvement in 48 hours       Relevant Orders   DG Cervical Spine Complete   Sedimentation rate (Completed)   CBC with Differential/Platelet (Completed)   Comprehensive metabolic panel (Completed)   RESOLVED: Acute renal failure ([place])    Labs rom the last 6 weeks reviewed.  Her GFR dropped during follow up BMET for telmisartan start, then  normalized after stopping telmisartan, but has dropped again,  Likely due to cardiac cathetrization done August 2 . She is not taking NSAIDs or diuretics.  Renal artery doppler has been done to rule out RAS.   Marland Kitchen Referring to nephrology for further evaluation .  Lab Results  Component Value Date   CREATININE 1.36 (H) 08/18/2021   Lab Results  Component Value Date   NA 142 08/18/2021   K 5.0 08/18/2021   CL 107 08/18/2021   CO2 27 08/18/2021         CKD (chronic kidney disease) stage 3, GFR 30-59 ml/min (HCC)     Her GFR dropped during follow up BMET for telmisartan start, then  normalized after stopping telmisartan, but dropped again after her cardiac cathetrization August 2 . She is not taking NSAIDs or diuretics.  Renal artery doppler was negative for stenosis  Referring to nephrology for further evaluation .  Lab Results  Component Value Date   CREATININE 1.36 (H) 08/18/2021   Lab Results  Component Value Date   NA 142 08/18/2021   K 5.0 08/18/2021   CL 107 08/18/2021   CO2 27 08/18/2021         Other Visit Diagnoses     Need for immunization against influenza       Relevant Orders   Flu Vaccine QUAD High Dose(Fluad) (Completed)        I am having Sabrina Montoya. [place]on start on predniSONE. I am also having her maintain her cholecalciferol, aspirin, Multiple Vitamins-Minerals (PRESERVISION AREDS 2 PO), rosuvastatin, Cyanocobalamin (VITAMIN B12 PO), conjugated estrogens, Turmeric (QC TUMERIC COMPLEX PO), omeprazole, methocarbamol, isosorbide mononitrate, nitroGLYCERIN, amLODipine, metoprolol succinate, amLODipine, metoprolol succinate, isosorbide mononitrate, cyanocobalamin, and methocarbamol.  Meds ordered this encounter  Medications   predniSONE (DELTASONE) 10 MG tablet    Sig: 6 tablets daily for 3 days, then reduce by 1 tablet daily until gone    Dispense:  33 tablet    Refill:  0    There are no discontinued medications.  Follow-up: No follow-ups on file.   Crecencio Mc, MD

## 2021-08-18 NOTE — Progress Notes (Signed)
Virtual Visit completed. Patient informed on EP and RD appointment and 6 Minute walk test. Patient also informed of patient health questionnaires on My Chart. Patient Verbalizes understanding. Visit diagnosis can be found in [facility] 06/05/2021.

## 2021-08-19 LAB — CBC WITH DIFFERENTIAL/PLATELET
Basophils Absolute: 0 10*3/uL (ref 0.0–0.1)
Basophils Relative: 0.7 % (ref 0.0–3.0)
Eosinophils Absolute: 0.2 10*3/uL (ref 0.0–0.7)
Eosinophils Relative: 3.7 % (ref 0.0–5.0)
HCT: 30.6 % — ABNORMAL LOW (ref 36.0–46.0)
Hemoglobin: 10.2 g/dL — ABNORMAL LOW (ref 12.0–15.0)
Lymphocytes Relative: 28.6 % (ref 12.0–46.0)
Lymphs Abs: 1.3 10*3/uL (ref 0.7–4.0)
MCHC: 33.2 g/dL (ref 30.0–36.0)
MCV: 85.7 fl (ref 78.0–100.0)
Monocytes Absolute: 0.5 10*3/uL (ref 0.1–1.0)
Monocytes Relative: 9.8 % (ref 3.0–12.0)
Neutro Abs: 2.7 10*3/uL (ref 1.4–7.7)
Neutrophils Relative %: 57.2 % (ref 43.0–77.0)
Platelets: 185 10*3/uL (ref 150.0–400.0)
RBC: 3.57 Mil/uL — ABNORMAL LOW (ref 3.87–5.11)
RDW: 14.4 % (ref 11.5–15.5)
WBC: 4.7 10*3/uL (ref 4.0–10.5)

## 2021-08-19 LAB — COMPREHENSIVE METABOLIC PANEL
ALT: 13 U/L (ref 0–35)
AST: 30 U/L (ref 0–37)
Albumin: 4.5 g/dL (ref 3.5–5.2)
Alkaline Phosphatase: 57 U/L (ref 39–117)
BUN: 32 mg/dL — ABNORMAL HIGH (ref 6–23)
CO2: 27 mEq/L (ref 19–32)
Calcium: 9.5 mg/dL (ref 8.4–10.5)
Chloride: 107 mEq/L (ref 96–112)
Creatinine, Ser: 1.36 mg/dL — ABNORMAL HIGH (ref 0.40–1.20)
GFR: 37.12 mL/min — ABNORMAL LOW (ref >60.00)
Glucose, Bld: 164 mg/dL — ABNORMAL HIGH (ref 70–99)
Potassium: 5 mEq/L (ref 3.5–5.1)
Sodium: 142 mEq/L (ref 135–145)
Total Bilirubin: 0.4 mg/dL (ref 0.2–1.2)
Total Protein: 6.5 g/dL (ref 6.0–8.3)

## 2021-08-19 LAB — SEDIMENTATION RATE: Sed Rate: 8 mm/hr (ref 0–30)

## 2021-08-20 NOTE — Assessment & Plan Note (Signed)
Her GFR dropped during follow up BMET for telmisartan start, then  normalized after stopping telmisartan, but dropped again after her cardiac cathetrization August 2 . She is not taking NSAIDs or diuretics.  Renal artery doppler was negative for stenosis  Referring to nephrology for further evaluation .  Lab Results  Component Value Date   CREATININE 1.36 (H) 08/18/2021   Lab Results  Component Value Date   NA 142 08/18/2021   K 5.0 08/18/2021   CL 107 08/18/2021   CO2 27 08/18/2021

## 2021-08-20 NOTE — Assessment & Plan Note (Signed)
Labs rom the last 6 weeks reviewed.  Her GFR dropped during follow up BMET for telmisartan start, then  normalized after stopping telmisartan, but has dropped again,  Likely due to cardiac cathetrization done August 2 . She is not taking NSAIDs or diuretics.  Renal artery doppler has been done to rule out RAS.   Marland Kitchen Referring to nephrology for further evaluation .  Lab Results  Component Value Date   CREATININE 1.36 (H) 08/18/2021   Lab Results  Component Value Date   NA 142 08/18/2021   K 5.0 08/18/2021   CL 107 08/18/2021   CO2 27 08/18/2021

## 2021-08-20 NOTE — Assessment & Plan Note (Signed)
Radiation of pain to the C3 distribution area (left parietal scalp).  Plain films ordered.  Muscle relaxer and tylenol recommended.  Add prednisone if no improvement in 48 hours

## 2021-08-23 ENCOUNTER — Telehealth: Payer: Self-pay | Admitting: Internal Medicine

## 2021-08-23 DIAGNOSIS — R002 Palpitations: Secondary | ICD-10-CM | POA: Diagnosis not present

## 2021-08-23 DIAGNOSIS — R55 Syncope and collapse: Secondary | ICD-10-CM | POA: Diagnosis not present

## 2021-08-23 NOTE — Telephone Encounter (Signed)
Patient called in stating that she is having trouble taking methocarbamol (ROBAXIN) 500 MG tablet and Tylenol.Please call her on her home phone at [phone].

## 2021-08-24 DIAGNOSIS — Z9889 Other specified postprocedural states: Secondary | ICD-10-CM | POA: Diagnosis not present

## 2021-08-24 DIAGNOSIS — F17200 Nicotine dependence, unspecified, uncomplicated: Secondary | ICD-10-CM | POA: Diagnosis not present

## 2021-08-24 DIAGNOSIS — I6523 Occlusion and stenosis of bilateral carotid arteries: Secondary | ICD-10-CM | POA: Diagnosis not present

## 2021-08-24 DIAGNOSIS — I25119 Atherosclerotic heart disease of native coronary artery with unspecified angina pectoris: Secondary | ICD-10-CM | POA: Diagnosis not present

## 2021-08-24 DIAGNOSIS — I10 Essential (primary) hypertension: Secondary | ICD-10-CM | POA: Diagnosis not present

## 2021-08-24 DIAGNOSIS — R109 Unspecified abdominal pain: Secondary | ICD-10-CM | POA: Diagnosis not present

## 2021-08-24 DIAGNOSIS — R7989 Other specified abnormal findings of blood chemistry: Secondary | ICD-10-CM | POA: Diagnosis not present

## 2021-08-24 DIAGNOSIS — R002 Palpitations: Secondary | ICD-10-CM | POA: Diagnosis not present

## 2021-08-24 DIAGNOSIS — N281 Cyst of kidney, acquired: Secondary | ICD-10-CM | POA: Diagnosis not present

## 2021-08-24 NOTE — Telephone Encounter (Signed)
LMTCB

## 2021-08-29 ENCOUNTER — Ambulatory Visit (INDEPENDENT_AMBULATORY_CARE_PROVIDER_SITE_OTHER): Payer: Medicare Other | Admitting: Nurse Practitioner

## 2021-08-29 ENCOUNTER — Encounter: Payer: Self-pay | Admitting: Nurse Practitioner

## 2021-08-29 VITALS — BP 111/67 | HR 65 | Temp 100.9°F

## 2021-08-29 DIAGNOSIS — U071 COVID-19: Secondary | ICD-10-CM

## 2021-08-29 MED ORDER — MOLNUPIRAVIR EUA 200MG CAPSULE: 4.0000 | ORAL_CAPSULE | Freq: Two times a day (BID) | ORAL | 0 refills | Status: AC

## 2021-08-29 NOTE — Progress Notes (Signed)
Patient ID: Sabrina Montoya, female    DOB: [birth year], 80 y.o.   MRN: [id number]  Virtual visit completed through [place], a video enabled telemedicine application. Due to national recommendations of social distancing due to COVID-19, a virtual visit is felt to be most appropriate for this patient at this time. Reviewed limitations, risks, security and privacy concerns of performing a virtual visit and the availability of in person appointments. I also reviewed that there may be a patient responsible charge related to this service. The patient agreed to proceed.   After attempting to connect via video enabled device and failing we reverted to a telephone encounter.   Patient location: home Provider location: [place] at [facility], office Persons participating in this virtual visit: patient, provider    If any vitals were documented, they were collected by patient at home unless specified below.    BP 111/67   Pulse 65   Temp (!) 100.9 F (38.3 C)    CC: Fever, runny nose Subjective:   HPI: Sabrina Montoya is a 80 y.o. female presenting on 08/29/2021 for Nasal Congestion (Started with runny nose and headache last week, last night started with sore throat, cough, fever 100.9 this morning, "feels terrible." Covid test negative on 08/26/21.  )   Symptoms started last wednesday At home covid test was negative Vaccinated pfizerx2 and booster No OTC sympotms    Relevant past medical, surgical, family and social history reviewed and updated as indicated. Interim medical history since our last visit reviewed. Allergies and medications reviewed and updated. Outpatient Medications Prior to Visit  Medication Sig Dispense Refill   amLODipine (NORVASC) 5 MG tablet Take by mouth.     aspirin 81 MG tablet Take 81 mg by mouth daily.     cholecalciferol (VITAMIN D) 1000 UNITS tablet Take 1,000 Units by mouth daily.     clonazePAM (KLONOPIN) 0.5 MG tablet Take by mouth.     cyanocobalamin 1000  MCG tablet Take by mouth.     isosorbide mononitrate (IMDUR) 60 MG 24 hr tablet Take 1 tablet (60 mg total) by mouth daily. 30 tablet 0   metoprolol succinate (TOPROL-XL) 100 MG 24 hr tablet Take by mouth.     Multiple Vitamins-Minerals (PRESERVISION AREDS 2 PO) Take by mouth 2 (two) times daily.     nitroGLYCERIN (NITROSTAT) 0.4 MG SL tablet Place 1 tablet (0.4 mg total) under the tongue every 5 (five) minutes as needed for chest pain. 50 tablet 3   omeprazole (PRILOSEC) 20 MG capsule TAKE 1 CAPSULE(20 MG) BY MOUTH DAILY 90 capsule 1   rosuvastatin (CRESTOR) 40 MG tablet Take 1 tablet (40 mg total) by mouth daily. 90 tablet 0   Turmeric (QC TUMERIC COMPLEX PO) Take 1 mg by mouth daily.     conjugated estrogens (PREMARIN) vaginal cream Place vaginally as needed. (Patient not taking: No sig reported)     amLODipine (NORVASC) 5 MG tablet Take 5 mg by mouth daily. (Patient not taking: No sig reported)     Cyanocobalamin (VITAMIN B12 PO) Take 1 Dose by mouth daily. GUMMY CHEWS; 3,000 mg     isosorbide mononitrate (IMDUR) 30 MG 24 hr tablet Take 30 mg by mouth daily.     methocarbamol (ROBAXIN) 500 MG tablet Take 500 mg by mouth at bedtime.     methocarbamol (ROBAXIN) 500 MG tablet at bedtime     metoprolol succinate (TOPROL-XL) 100 MG 24 hr tablet Take 100 mg by mouth daily. (Patient not  taking: No sig reported)     predniSONE (DELTASONE) 10 MG tablet 6 tablets daily for 3 days, then reduce by 1 tablet daily until gone 33 tablet 0   No facility-administered medications prior to visit.     Per HPI unless specifically indicated in ROS section below Review of Systems  Constitutional:  Positive for chills, fatigue and fever.  HENT:  Positive for congestion, postnasal drip, rhinorrhea and sore throat.   Respiratory:  Positive for cough and shortness of breath (with cough).   Cardiovascular:  Positive for chest pain.  Gastrointestinal:  Negative for diarrhea, nausea and vomiting.  Musculoskeletal:   Positive for myalgias.  Neurological:  Negative for dizziness, light-headedness and headaches.  Objective:  BP 111/67   Pulse 65   Temp (!) 100.9 F (38.3 C)   Wt Readings from Last 3 Encounters:  08/18/21 154 lb 9.6 oz (70.1 kg)  06/30/21 151 lb 9.6 oz (68.8 kg)  06/08/21 150 lb 8 oz (68.3 kg)      Physical exam: Gen: alert, NAD, not ill appearing Pulm: speaks in complete sentences without increased work of breathing Psych: normal mood, normal thought content      Results for orders placed or performed in visit on 08/18/21  Sedimentation rate  Result Value Ref Range   Sed Rate 8 0 - 30 mm/hr  CBC with Differential/Platelet  Result Value Ref Range   WBC 4.7 4.0 - 10.5 K/uL   RBC 3.57 (L) 3.87 - 5.11 Mil/uL   Hemoglobin 10.2 (L) 12.0 - 15.0 g/dL   HCT 30.6 (L) 36.0 - 46.0 %   MCV 85.7 78.0 - 100.0 fl   MCHC 33.2 30.0 - 36.0 g/dL   RDW 14.4 11.5 - 15.5 %   Platelets 185.0 150.0 - 400.0 K/uL   Neutrophils Relative % 57.2 43.0 - 77.0 %   Lymphocytes Relative 28.6 12.0 - 46.0 %   Monocytes Relative 9.8 3.0 - 12.0 %   Eosinophils Relative 3.7 0.0 - 5.0 %   Basophils Relative 0.7 0.0 - 3.0 %   Neutro Abs 2.7 1.4 - 7.7 K/uL   Lymphs Abs 1.3 0.7 - 4.0 K/uL   Monocytes Absolute 0.5 0.1 - 1.0 K/uL   Eosinophils Absolute 0.2 0.0 - 0.7 K/uL   Basophils Absolute 0.0 0.0 - 0.1 K/uL  Comprehensive metabolic panel  Result Value Ref Range   Sodium 142 135 - 145 mEq/L   Potassium 5.0 3.5 - 5.1 mEq/L   Chloride 107 96 - 112 mEq/L   CO2 27 19 - 32 mEq/L   Glucose, Bld 164 (H) 70 - 99 mg/dL   BUN 32 (H) 6 - 23 mg/dL   Creatinine, Ser 1.36 (H) 0.40 - 1.20 mg/dL   Total Bilirubin 0.4 0.2 - 1.2 mg/dL   Alkaline Phosphatase 57 39 - 117 U/L   AST 30 0 - 37 U/L   ALT 13 0 - 35 U/L   Total Protein 6.5 6.0 - 8.3 g/dL   Albumin 4.5 3.5 - 5.2 g/dL   GFR 37.12 (L) >60.00 mL/min   Calcium 9.5 8.4 - 10.5 mg/dL   Assessment & Plan:   Problem List Items Addressed This Visit       Other    COVID-19 - Primary    Patient recently tested for COVID-19 earlier in the week and was negative.  Upon office visit virtually today I recommended patient retest patient did retesting came back positive with an at home test.  Did discuss treatments  and settled on antiviral of molnupiravir.  Did discuss with patient that this is emergency use authorized medication only did discuss common side effects with medication patient in agreements with starting therapy.  Did recommend she start it tonight as we are on the cusp of being 5 days since her symptoms started.  Did discuss signs and symptoms which patient is to be seen urgently or emergently patient agreed.  Continue over-the-counter treatments as needed for symptom relief. Start as molnupiravir soon as possible      Relevant Medications   molnupiravir EUA (LAGEVRIO) 200 mg CAPS capsule     No orders of the defined types were placed in this encounter.  No orders of the defined types were placed in this encounter.  Phone call lasted 13 minutes  I discussed the assessment and treatment plan with the patient. The patient was provided an opportunity to ask questions and all were answered. The patient agreed with the plan and demonstrated an understanding of the instructions. The patient was advised to call back or seek an in-person evaluation if the symptoms worsen or if the condition fails to improve as anticipated.  Follow up plan: No follow-ups on file.  Romilda Garret, NP

## 2021-08-29 NOTE — Assessment & Plan Note (Signed)
Patient recently tested for COVID-19 earlier in the week and was negative.  Upon office visit virtually today I recommended patient retest patient did retesting came back positive with an at home test.  Did discuss treatments and settled on antiviral of molnupiravir.  Did discuss with patient that this is emergency use authorized medication only did discuss common side effects with medication patient in agreements with starting therapy.  Did recommend she start it tonight as we are on the cusp of being 5 days since her symptoms started.  Did discuss signs and symptoms which patient is to be seen urgently or emergently patient agreed.  Continue over-the-counter treatments as needed for symptom relief. Start as molnupiravir soon as possible

## 2021-08-30 ENCOUNTER — Ambulatory Visit: Payer: Medicare Other | Admitting: *Deleted

## 2021-08-30 ENCOUNTER — Telehealth: Payer: Self-pay | Admitting: Internal Medicine

## 2021-08-30 DIAGNOSIS — I739 Peripheral vascular disease, unspecified: Secondary | ICD-10-CM

## 2021-08-30 DIAGNOSIS — I6523 Occlusion and stenosis of bilateral carotid arteries: Secondary | ICD-10-CM

## 2021-08-30 DIAGNOSIS — G5622 Lesion of ulnar nerve, left upper limb: Secondary | ICD-10-CM

## 2021-08-30 DIAGNOSIS — E1140 Type 2 diabetes mellitus with diabetic neuropathy, unspecified: Secondary | ICD-10-CM

## 2021-08-30 DIAGNOSIS — M5432 Sciatica, left side: Secondary | ICD-10-CM

## 2021-08-30 DIAGNOSIS — G459 Transient cerebral ischemic attack, unspecified: Secondary | ICD-10-CM

## 2021-08-30 DIAGNOSIS — N1831 Chronic kidney disease, stage 3a: Secondary | ICD-10-CM

## 2021-08-30 DIAGNOSIS — N179 Acute kidney failure, unspecified: Secondary | ICD-10-CM

## 2021-08-30 NOTE — Telephone Encounter (Signed)
Sabrina Montoya called in and stated that her throat is hurting and she feels like the back of her throat is cut open and it hurts. And she stated that she had a stroke in august and has to take nitroglycerin and wanted to know if that is why it could not be working.

## 2021-08-30 NOTE — Chronic Care Management (AMB) (Signed)
Chronic Care Management    Clinical Social Work Note  08/30/2021 Name: Sabrina Montoya MRN: [id number] DOB: 1941-08-08  KHUSHBU PIPPEN is a 80 y.o. year old female who is a primary care patient of Derrel Nip, Aris Everts, MD. The CCM team was consulted to assist the patient with chronic disease management and/or care coordination needs related to: Mental Health Counseling and Resources.   Engaged with patient by telephone for follow-up visit in response to provider referral for social work chronic care management and care coordination services.   Consent to Services:  The patient was given information about Chronic Care Management services, agreed to services, and gave verbal consent prior to initiation of services.  Please see initial visit note for detailed documentation.   Patient agreed to services and consent obtained.   Assessment: Review of patient past medical history, allergies, medications, and health status, including review of relevant consultants reports was performed today as part of a comprehensive evaluation and provision of chronic care management and care coordination services.     SDOH (Social Determinants of Health) assessments and interventions performed:    Advanced Directives Status: Not addressed in this encounter.  CCM Care Plan  Allergies  Allergen Reactions   Telmisartan Other (See Comments)    Acute renal failure Renal issues   Niacin And Related Hives   Sertraline Hcl Other (See Comments)    Hallucinations,     Sulfa Drugs Cross Reactors Nausea Only   Tetracyclines & Related Hives   Tizanidine Other (See Comments)    Issues with kidney function   Latex Rash    RAST testing was NEGATIVE  for LATEX   Simvastatin Rash    *Antihyperlipidemics*; elevated LFT's.     Outpatient Encounter Medications as of 08/30/2021  Medication Sig   amLODipine (NORVASC) 5 MG tablet Take by mouth.   aspirin 81 MG tablet Take 81 mg by mouth daily.   cholecalciferol (VITAMIN D) 1000  UNITS tablet Take 1,000 Units by mouth daily.   clonazePAM (KLONOPIN) 0.5 MG tablet Take by mouth.   conjugated estrogens (PREMARIN) vaginal cream Place vaginally as needed. (Patient not taking: No sig reported)   cyanocobalamin 1000 MCG tablet Take by mouth.   isosorbide mononitrate (IMDUR) 60 MG 24 hr tablet Take 1 tablet (60 mg total) by mouth daily.   metoprolol succinate (TOPROL-XL) 100 MG 24 hr tablet Take by mouth.   molnupiravir EUA (LAGEVRIO) 200 mg CAPS capsule Take 4 capsules (800 mg total) by mouth 2 (two) times daily for 5 days.   Multiple Vitamins-Minerals (PRESERVISION AREDS 2 PO) Take by mouth 2 (two) times daily.   nitroGLYCERIN (NITROSTAT) 0.4 MG SL tablet Place 1 tablet (0.4 mg total) under the tongue every 5 (five) minutes as needed for chest pain.   omeprazole (PRILOSEC) 20 MG capsule TAKE 1 CAPSULE(20 MG) BY MOUTH DAILY   rosuvastatin (CRESTOR) 40 MG tablet Take 1 tablet (40 mg total) by mouth daily.   Turmeric (QC TUMERIC COMPLEX PO) Take 1 mg by mouth daily.   No facility-administered encounter medications on file as of 08/30/2021.    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 05/05/2021   Extensor tenosynovitis of right wrist 05/05/2021   Pain, dental 03/17/2021   Chronic thumb pain, right 05/15/2020   Syncope 12/31/2019   Cervical radiculitis 09/14/2019  Arrhythmia, sinus node 05/29/2019   Fatigue 06/07/2018   Insomnia due to anxiety and fear 04/20/2018   Chronic bladder pain 12/23/2017   Irritable bowel syndrome (IBS) 12/23/2017   Atrophic vaginitis 02/11/2017   CKD (chronic kidney disease) stage 3, GFR 30-59 ml/min (HCC) 08/24/2016   Dysphagia, pharyngoesophageal phase 06/20/2016   Sciatica, left side 06/20/2016   Controlled type 2 diabetes with neuropathy ([place]) 05/16/2016    Preventative health care 02/06/2016   Cervicalgia 01/09/2015   Cystocele 08/29/2014   Anemia 05/02/2014   TIA (transient ischemic attack) 09/11/2013   Overweight (BMI 25.0-29.9) 03/16/2013   Bilateral carotid artery stenosis 03/03/2013   Ulnar neuropathy of left upper extremity 03/03/2013   Chronic reflux esophagitis 12/29/2012   Right hip pain 12/29/2012   History of tobacco abuse 07/03/2012   AAA (abdominal aortic aneurysm) without rupture (HCC)    CAD (coronary artery disease), autologous vein bypass graft    Peripheral vascular disease ([place])    Arthritis 02/29/2012   Hyperlipidemia    Osteopenia 08/29/2011   Vitamin D deficiency 08/29/2011    Conditions to be addressed/monitored: HTN and Anxiety.  Limited Social Support, Mental Health Concerns, Social Isolation, Limited Access to Caregiver, and Lacks Knowledge of [employer].  Care Plan : LCSW Plan of Care  Updates made by Francis Gaines, LCSW since 08/30/2021 12:00 AM     Problem: Reduce and Manage My Symptoms of Anxiety and Stress.   Priority: High     Goal: Reduce and Manage My Symptoms of Anxiety and Stress.   Start Date: 07/21/2021  Expected End Date: 09/20/2021  This Visit's Progress: On track  Recent Progress: On track  Priority: High  Note:   Current Barriers:   Acute Mental Health needs related to Coronary Artery Disease Involving Native Coronary Artery of Native Heart, Stage II Chronic Kidney Disease, Type 2 Diabetes Mellitus with Chronic Kidney Disease, Peripheral Vascular Disease, Pure Hypercholesterolemia, Anxiety and Stress of Chronic Illness requires Support, Education, Resources, Referrals and Care Coordination in order to meet unmet mental health needs. Clinical Goal(s):  Patient will work with LCSW to reduce and manage symptoms of Anxiety and Stress, until established with a community provider.   Patient will increase knowledge and/or ability of:  Coping Skills, Self-Management Skills and  Stress Reduction.     Clinical Interventions:  Solution-Focused Therapy Performed and Verbalization of Feelings Encouraged.   Collaboration with Primary Care Physician, Dr. Deborra Medina regarding development and update of comprehensive plan of care as evidenced by provider attestation and co-signature. Patient Goals/Self-Care Activities: Continue to receive personal counseling with LCSW, on a weekly/bi-weekly basis, to reduce and manage symptoms of Anxiety and Stress.  Consider keeping a journal of good days and bad days, identifying and utilizing coping mechanisms that have worked for you in the past.   Due to testing positive for COVID-19 on 08/30/2021, you will need to reschedule [place] at [facility] [phone]# [phone]), scheduled for 09/01/2021 at 9:30am. Contact LCSW directly (# [phone]) if you have questions, need assistance, or if additional social work needs are identified between now and our next scheduled telephone outreach call. Follow-Up:  09/20/2021 at 10:30am       [place] Licensed Clinical Social Worker [place]  [phone]

## 2021-08-30 NOTE — Telephone Encounter (Signed)
Spoke with patient about her symptoms. Patient states sore throat is a little better, but last night was severe to the point she could not swallow due to the severity of pain when she swallows. It feels like her throat is cut up with razors. Advised patient that this symptom has been one of the main issues patient have been having with this COVID strand. Advised after 3 days of this it should start to resolve but to let us know if not or if she develops any other symptoms of concern. Advised patient to try and gurgle, honey, hot tea/soup, Tylenol, cough drops, throat lozenges. To try drink and eat as much as she can and tolerate so she does not get dehydrated or weak. She has started COVID medication that was given by [facility] yesterday and is taking it still, advised to make sure and take the whole course. Patient verbalized understanding. I advised patient that I would check with Catalina Antigua to see if he had any other suggestions on this or any other medications that can help the pain. Advised patient I would call back if he had any other recommendations otherwise to keep [place] posted as needed.

## 2021-08-30 NOTE — Patient Instructions (Signed)
Visit Information  PATIENT GOALS:  Goals Addressed               This Visit's Progress     Reduce and Manage My Symptoms of Anxiety and Stress. (pt-stated)   On track     Timeframe:  Short-Term Goal Priority:  High Start Date:   07/21/2021                          Expected End Date:  09/20/2021                   Follow-Up Date:  09/20/2021 at 10:30am  Patient Goals/Self-Care Activities: Continue to receive personal counseling with LCSW, on a weekly/bi-weekly basis, to reduce and manage symptoms of Anxiety and Stress.  Consider keeping a journal of good days and bad days, identifying and utilizing coping mechanisms that have worked for you in the past.   Due to testing positive for COVID-19 on 08/30/2021, you will need to reschedule [place] at [facility] [phone]# [phone]), scheduled for 09/01/2021 at 9:30am. Contact LCSW directly (# [phone]) if you have questions, need assistance, or if additional social work needs are identified between now and our next scheduled telephone outreach call.        Patient verbalizes understanding of instructions provided today and agrees to view in [place].   Telephone follow up appointment with care management team member scheduled for:  09/20/2021 at 10:30am  [place] Licensed Clinical Social Worker [place]  [phone]

## 2021-08-31 NOTE — Telephone Encounter (Signed)
I think that sounds appropriate. The sore throat can be a difficult thing to manage. Can we check to make sure that she is still doing fine? To see if the throat has improved any?  Also make sure that she is swallowing fine

## 2021-08-31 NOTE — Telephone Encounter (Signed)
Patient states her sore throat is still bad, throat lozenges make it better some at times. She is eating and drinking some as she can. No difficulty swallowing as far as no swelling or choking just hurts a lot to swallow. She did not sleep much at all due to pain. Patient states she had mouth thrush in the past and it feels like that, like that raw feeling, but there are not blisters or white patches in her mouth. Patient will let us know tomorrow if she is not getting any better at all with her sore throat or has any additional issues, if she is improving she will update [place] as needed at that point.

## 2021-09-01 ENCOUNTER — Ambulatory Visit: Payer: Medicare Other

## 2021-09-05 DIAGNOSIS — N179 Acute kidney failure, unspecified: Secondary | ICD-10-CM | POA: Diagnosis not present

## 2021-09-05 DIAGNOSIS — I2510 Atherosclerotic heart disease of native coronary artery without angina pectoris: Secondary | ICD-10-CM | POA: Diagnosis not present

## 2021-09-05 DIAGNOSIS — G459 Transient cerebral ischemic attack, unspecified: Secondary | ICD-10-CM | POA: Diagnosis not present

## 2021-09-05 DIAGNOSIS — N1831 Chronic kidney disease, stage 3a: Secondary | ICD-10-CM | POA: Diagnosis not present

## 2021-09-05 DIAGNOSIS — E1140 Type 2 diabetes mellitus with diabetic neuropathy, unspecified: Secondary | ICD-10-CM

## 2021-09-05 DIAGNOSIS — E1122 Type 2 diabetes mellitus with diabetic chronic kidney disease: Secondary | ICD-10-CM

## 2021-09-05 DIAGNOSIS — N182 Chronic kidney disease, stage 2 (mild): Secondary | ICD-10-CM

## 2021-09-06 ENCOUNTER — Ambulatory Visit
Admission: RE | Admit: 2021-09-06 | Discharge: 2021-09-06 | Disposition: A | Discharge status: Home / Self Care | Payer: Medicare Other | Source: Ambulatory Visit | Attending: Internal Medicine | Admitting: Internal Medicine

## 2021-09-06 ENCOUNTER — Ambulatory Visit (INDEPENDENT_AMBULATORY_CARE_PROVIDER_SITE_OTHER): Payer: Medicare Other | Admitting: Pharmacist

## 2021-09-06 ENCOUNTER — Telehealth: Payer: Self-pay | Admitting: Pharmacist

## 2021-09-06 DIAGNOSIS — U071 COVID-19: Secondary | ICD-10-CM

## 2021-09-06 DIAGNOSIS — I2510 Atherosclerotic heart disease of native coronary artery without angina pectoris: Secondary | ICD-10-CM

## 2021-09-06 DIAGNOSIS — N1831 Chronic kidney disease, stage 3a: Secondary | ICD-10-CM

## 2021-09-06 DIAGNOSIS — I739 Peripheral vascular disease, unspecified: Secondary | ICD-10-CM

## 2021-09-06 DIAGNOSIS — E1140 Type 2 diabetes mellitus with diabetic neuropathy, unspecified: Secondary | ICD-10-CM

## 2021-09-06 DIAGNOSIS — R059 Cough, unspecified: Secondary | ICD-10-CM | POA: Diagnosis not present

## 2021-09-06 NOTE — Chronic Care Management (AMB) (Signed)
Chronic Care Management CCM Pharmacy Note  09/06/2021 Name:  Sabrina Montoya MRN:  [id number] DOB:  [birth year]  Subjective: Sabrina Montoya is an 80 y.o. year old female who is a primary patient of Derrel Nip, Aris Everts, MD.  The CCM team was consulted for assistance with disease management and care coordination needs.    Engaged with patient by telephone for follow up visit for pharmacy case management and/or care coordination services.   Objective:  Medications Reviewed Today     Reviewed by Francis Gaines, LCSW (Social Worker) on 08/30/21 at 1131  Med List Status: <None>   Medication Order Taking? Sig Documenting Provider Last Dose Status Informant  amLODipine (NORVASC) 5 MG tablet [id number] No Take by mouth. [provider] Taking Active   aspirin 81 MG tablet [id number] No Take 81 mg by mouth daily. [provider] Taking Active Self  cholecalciferol (VITAMIN D) 1000 UNITS tablet [id number] No Take 1,000 Units by mouth daily. [provider] Taking Active Self  clonazePAM (KLONOPIN) 0.5 MG tablet [id number] No Take by mouth. [provider] Taking Active   conjugated estrogens (PREMARIN) vaginal cream [id number] No Place vaginally as needed.  Patient not taking: No sig reported   [provider] Not Taking Active   cyanocobalamin 1000 MCG tablet [id number] No Take by mouth. [provider] Taking Active   isosorbide mononitrate (IMDUR) 60 MG 24 hr tablet [id number] No Take 1 tablet (60 mg total) by mouth daily. Nolberto Hanlon, MD Taking Expired 08/29/21 2359   metoprolol succinate (TOPROL-XL) 100 MG 24 hr tablet [id number] No Take by mouth. [provider] Taking Active   molnupiravir EUA (LAGEVRIO) 200 mg CAPS capsule [id number]  Take 4 capsules (800 mg total) by mouth 2 (two) times daily for 5 days. Michela Pitcher, NP  Active   Multiple Vitamins-Minerals (PRESERVISION AREDS 2 PO) [id number] No Take by mouth 2 (two) times daily. [provider] Taking Active Self  nitroGLYCERIN (NITROSTAT) 0.4 MG SL tablet [id number] No Place 1 tablet (0.4 mg total) under the tongue every 5 (five) minutes as needed for chest pain. Crecencio Mc, MD Taking Active   omeprazole (PRILOSEC) 20 MG capsule [id number] No TAKE 1 CAPSULE(20 MG) BY MOUTH DAILY Crecencio Mc, MD Taking Active Self  rosuvastatin (CRESTOR) 40 MG tablet [id number] No Take 1 tablet (40 mg total) by mouth daily. Crecencio Mc, MD Taking Active Self  Turmeric (QC TUMERIC COMPLEX PO) [id number] No Take 1 mg by mouth daily. [provider] Taking Active Self  Med List Note Leeanne Rio, [place] 11/13/17 0626): Please notify lab that pt needs a butterfly due to blood flow issues. Pt asked that I make a note in her chart so that lab tech dont get upset everytime she mentions it to them.            Pertinent Labs:   Lab Results  Component Value Date   HGBA1C 7.0 (H) 06/06/2021   Lab Results  Component Value Date   CHOL 116 06/06/2021   HDL 44 06/06/2021   LDLCALC 48 06/06/2021   LDLDIRECT 48.0 04/18/2018   TRIG 122 06/06/2021   CHOLHDL 2.6 06/06/2021   Lab Results  Component Value Date   CREATININE 1.36 (H) 08/18/2021   BUN 32 (H) 08/18/2021   NA 142 08/18/2021   K 5.0 08/18/2021   CL 107 08/18/2021   CO2 27 08/18/2021     SDOH:  (Social Determinants of Health) assessments  and interventions performed: Yes SDOH Interventions    Flowsheet Row Most Recent Value  SDOH Interventions   Financial Strain Interventions Intervention Not Indicated       CCM Care Plan  Review of patient past medical history, allergies, medications, health status, including review of consultants reports, laboratory and other test data, was performed as part of comprehensive evaluation and provision of chronic care management services.   Conditions to be addressed/monitored:  Hypertension, Hyperlipidemia, Diabetes, Coronary Artery Disease, and Chronic Kidney  Disease  Care Plan : Medication Management  Updates made by De Hollingshead, RPH-CPP since 09/06/2021 12:00 AM     Problem: Diabetes, CKD, HTN, CAD, PVD      Long-Range Goal: Disease Progression Prevention   Start Date: 05/12/2021  Recent Progress: On track  Priority: High  Note:   Current Barriers:  Suboptimal therapeutic regimen for diabetes, CKD , CAD  Pharmacist Clinical Goal(s):  Over the next 90 days, patient will achieve control of diabetes as evidenced by A1c  through collaboration with PharmD and provider.   Interventions: 1:1 collaboration with Crecencio Mc, MD regarding development and update of comprehensive plan of care as evidenced by provider attestation and co-signature Inter-disciplinary care team collaboration (see longitudinal plan of care) Comprehensive medication review performed; medication list updated in electronic medical record  Health Maintenance Yearly diabetic eye exam: up to date Yearly diabetic foot exam: due Urine microalbumin: up to date Yearly influenza vaccination: due Td/Tdap vaccination: up to date Pneumonia vaccination: up to date COVID vaccinations: due for booster, but defer at least 3 months from [place] test Shingrix vaccinations: due - will discuss moving forward  Diabetes in the setting of CKD, CAD Right at goal of A1c <7%; current treatment: none  Current glucose readings: not checking Previously discussed SGLT2. Re-established with nephrology last week. Reports they did not discuss additional treatment. Will allow for resolution of current infection and re-discuss addition of SGLT2.  Hypertension (CAD, s/p CABG 2006, PVD) in the setting of CKD Controlled; current treatment: isosorbide 60 mg daily, amlodipine 5 mg daily, metoprolol succinate 100 gm daily; follows w/ [facility] cardiology  Acute rise in Scr (38%) with telmisartan initiation. Avoiding ARB. BP was at goal at both cardiology and nephrology visits recently.   Recommended to continue current regimen at this time. Plan to initiate SGLT2 moving forward.   Hyperlipidemia, secondary ASCVD prevention: Lipids controlled at goal <70; current treatment: rosuvastatin 40 mg daily;  Medications previously tried: niacin, simvastatin Antiplatelet regimen: aspirin 81 mg daily Recommended to continue current regimen at this time  GERD/hx esophagitis: Appropriately managed; current regimen: omeprazole 20 mg daily  PPI therapy appropriate given hx esophagitis. Previously recommended to continue current regimen at this time  Supplements: Vitamin D, Vitamin B12, Preservision AREDS 2, turmeric   Patient Goals/Self-Care Activities Over the next 90 days, patient will:  - take medications as prescribed check blood pressure periodically, document, and provide at future appointments  Follow Up Plan: Telephone follow up appointment with care management team member scheduled for: ~ 6 weeks       Plan: Telephone follow up appointment with care management team member scheduled for:  6 weeks  Catie Darnelle Maffucci, PharmD, [place], [place] Pharmacist [employer] at [employer] [phone]

## 2021-09-06 NOTE — Telephone Encounter (Signed)
Spoke with pt and scheduled her for a virtual with you tomorrow at 2pm. Pt also decided that she will go have the xray done today [place] and can get out at the door and someone will park her car since she doesn't think she can walk that far.

## 2021-09-06 NOTE — Telephone Encounter (Signed)
Spoke with pt and advised her of the chest xray that has been ordered. Pt stated that she does not feel like going to [facility] to have the chest xray done and she does not have anybody that can take her and she doesn't drive. She stated that nobody wants to be around her since she tested positive for covid and still having symptoms.

## 2021-09-06 NOTE — Patient Instructions (Signed)
Visit Information  Patient verbalizes understanding of instructions provided today and agrees to view in [place].      Plan: Telephone follow up appointment with care management team member scheduled for:  6 weeks  Catie Darnelle Maffucci, PharmD, [place], [place] Clinical Pharmacist [employer] at [employer] [phone]

## 2021-09-06 NOTE — Telephone Encounter (Signed)
Spoke with patient for CCM visit, she reports initial improvement and then worsening of symptoms over the weekend.   In review: per her report symptoms started Monday, 10/24 (though per NP Cable's documentation, she had reported symptoms the week prior), tested positive with home test on 10/24. Received script for molnupiravir and she reports she completed it.  She reports that she was starting to feel better end of last week/beginning of this weekend, but reports worsening of symptoms late Saturday/early Sunday (10/29-10/30). Reports productive cough with white, thick sputum with nasal congestion, sore throat, fatigue, and temperature to 98.2 last night.   Routing to PCP and triage team for recommendations. I advised patient to seek evaluation today, but noted I would discuss with PCP as well  We also reviewed isolation precautions per CDC.

## 2021-09-07 ENCOUNTER — Encounter: Payer: Self-pay | Admitting: Internal Medicine

## 2021-09-07 ENCOUNTER — Telehealth (INDEPENDENT_AMBULATORY_CARE_PROVIDER_SITE_OTHER): Payer: Medicare Other | Admitting: Internal Medicine

## 2021-09-07 DIAGNOSIS — J0100 Acute maxillary sinusitis, unspecified: Secondary | ICD-10-CM | POA: Insufficient documentation

## 2021-09-07 MED ORDER — AMOXICILLIN-POT CLAVULANATE 875-125 MG PO TABS: 1.0000 | ORAL_TABLET | Freq: Two times a day (BID) | ORAL | 0 refills | Status: DC

## 2021-09-07 MED ORDER — PREDNISONE 10 MG PO TABS: ORAL_TABLET | ORAL | 0 refills | Status: DC

## 2021-09-07 NOTE — Assessment & Plan Note (Signed)
Given chronicity of symptoms, development of facial pain and history consistent with bacterial URI,  Will treat with empiric antibiotics, decongestants, prednisone taper, cough suppressant .   Probiotic advised

## 2021-09-07 NOTE — Progress Notes (Signed)
Virtual Visit converted to Telephone  Note  This visit type was conducted due to national recommendations for restrictions regarding the COVID-19 pandemic (e.g. social distancing).  This format is felt to be most appropriate for this patient at this time.  All issues noted in this document were discussed and addressed.  No physical exam was performed (except for noted visual exam findings with Video Visits).   I connected withNAME@ on 09/07/21 at  2:00 PM EDT by a video enabled telemedicine application  and verified that I am speaking with the correct person using two identifiers. Location patient: home Location provider: work or home office Persons participating in the virtual visit: patient, provider  I discussed the limitations, risks, security and privacy concerns of performing an evaluation and management service by telephone and the availability of in person appointments. I also discussed with the patient that there may be a patient responsible charge related to this service. The patient expressed understanding and agreed to proceed.  Interactive audio and video telecommunications were attempted between this provider and patient, however failed, due to patient having technical difficulties.  We continued and completed visit with audio only. .    Reason for visit:  post covid cough   HPI:   80 yr old female with type 2 DM,  CAD  and CKD  Diagnosed WITH COVID INFECTION on Monday October 24. Finished molnupirivar.   No fevers,  but having rhinitis  and cough productive of thick white sputum.   maxillary sinuses are tender to palpation.  Has not been taking decongestants or nasal sprays .  Chest x ray done yesterday was normal.    ROS: See pertinent positives and negatives per HPI.  Past Medical History:  Diagnosis Date   Abdominal aortic aneurysm without mention of rupture    infrarenal, stable, folllowed by Leotis Pain   Acute posthemorrhagic anemia    Arthritis    B12 deficiency     CAD (coronary artery disease), autologous vein bypass graft    Cardiac dysrhythmia, unspecified    Colon polyp    Depression    Diverticulitis of colon    Dizziness    DUE TO MEDICINES   Dysrhythmia    GERD (gastroesophageal reflux disease)    HOH (hard of hearing)    Hyperlipidemia    Hypertension    pt denies. placed on meds after CABG   IBS (irritable bowel syndrome)    Neuropathy    Neuropathy 07/01/2013   Peripheral vascular disease ([place])    s/p CEA    Reflux esophagitis    Rheumatic fever    possible at age 48   Sliding hiatal hernia    Tobacco abuse    Tobacco abuse     Past Surgical History:  Procedure Laterality Date   ABDOMINAL HYSTERECTOMY     ABDOMINAL SURGERY  1976   for pain secondary to scar tissue, s/p apply   APPENDECTOMY  1974   BACK SURGERY     CAROTID ENDARTERECTOMY     CATARACT EXTRACTION W/PHACO Right 10/18/2016   Procedure: CATARACT EXTRACTION PHACO AND INTRAOCULAR LENS PLACEMENT ([place]);  Surgeon: Estill Cotta, MD;  Location: [facility];  Service: Ophthalmology;  Laterality: Right;  Lot # [id number] H [place]: 01:04.0 AP%: 24.4 CDE: 30.44   CATARACT EXTRACTION W/PHACO Left 03/14/2017   Procedure: CATARACT EXTRACTION PHACO AND INTRAOCULAR LENS PLACEMENT ([place])  Left;  Surgeon: Leandrew Koyanagi, MD;  Location: [place];  Service: Ophthalmology;  Laterality: Left;   CEA  Carotid stenosis found during workup for dysphagia,  [place]  2009   Dr. Hoover Brunette for cervical cord stenosis, C5-6 diskectomy   CHOLECYSTECTOMY  2002   Dr. Pat Patrick   CLOSED MANIPULATION SHOULDER     left shoulder post physical therapy, Right shoulder redo (toggle bolts)   COLONOSCOPY  09/01/14   coronary angiography  01/2011   one occluded artery with collateralization, other arteries patent   CORONARY ARTERY BYPASS GRAFT  2006   s/p triple bypass surgery, [facility]   heart catherization  2016   HERNIA REPAIR  may 2011   Dr. Pat Patrick   hernia  repair     LEFT HEART CATH AND CORONARY ANGIOGRAPHY N/A 06/07/2021   Procedure: LEFT HEART CATH AND CORONARY ANGIOGRAPHY;  Surgeon: Isaias Cowman, MD;  Location: [place] [facility];  Service: Cardiovascular;  Laterality: N/A;   LUMBAR DISC SURGERY  1971   L5, unssuccessful, fusion in 1983 successful (2 lumbar)   PARTIAL HYSTERECTOMY     ROTATOR CUFF REPAIR  2002   right shoulder Dr. Nadean Corwin, [facility] in [place]      Family History  Problem Relation Age of Onset   Diabetes Mother    Heart disease Mother    Other Father        suicide   Cancer Sister        cervical cancer   Diabetes Sister    Diabetes Sister    Cancer Sister        breast   Breast cancer Sister    Heart disease Brother        coronary artery disease   Other Brother        suicide   Other Other        colon resection due to inflammation -nephew   Bladder Cancer Neg Hx    Kidney cancer Neg Hx     SOCIAL HX:  reports that she quit smoking about 9 years ago. Her smoking use included cigarettes. She has a 15.00 pack-year smoking history. She has never used smokeless tobacco. She reports that she does not drink alcohol and does not use drugs.    Current Outpatient Medications:    amLODipine (NORVASC) 5 MG tablet, Take by mouth., Disp: , Rfl:    amoxicillin-clavulanate (AUGMENTIN) 875-125 MG tablet, Take 1 tablet by mouth 2 (two) times daily., Disp: 14 tablet, Rfl: 0   aspirin 81 MG tablet, Take 81 mg by mouth daily., Disp: , Rfl:    cholecalciferol (VITAMIN D) 1000 UNITS tablet, Take 1,000 Units by mouth daily., Disp: , Rfl:    clonazePAM (KLONOPIN) 0.5 MG tablet, Take by mouth., Disp: , Rfl:    cyanocobalamin 1000 MCG tablet, Take by mouth., Disp: , Rfl:    isosorbide mononitrate (IMDUR) 60 MG 24 hr tablet, Take 1 tablet (60 mg total) by mouth daily., Disp: 30 tablet, Rfl: 0   metoprolol succinate (TOPROL-XL) 100 MG 24 hr tablet, Take by mouth., Disp: , Rfl:    Multiple  Vitamins-Minerals (PRESERVISION AREDS 2 PO), Take by mouth 2 (two) times daily., Disp: , Rfl:    nitroGLYCERIN (NITROSTAT) 0.4 MG SL tablet, Place 1 tablet (0.4 mg total) under the tongue every 5 (five) minutes as needed for chest pain., Disp: 50 tablet, Rfl: 3   omeprazole (PRILOSEC) 20 MG capsule, TAKE 1 CAPSULE(20 MG) BY MOUTH DAILY, Disp: 90 capsule, Rfl: 1   predniSONE (DELTASONE) 10 MG tablet, 6 tablets  on Day 1 , then reduce by 1 tablet daily until gone, Disp: 21 tablet, Rfl: 0   rosuvastatin (CRESTOR) 40 MG tablet, Take 1 tablet (40 mg total) by mouth daily., Disp: 90 tablet, Rfl: 0   Turmeric (QC TUMERIC COMPLEX PO), Take 1 mg by mouth daily., Disp: , Rfl:    conjugated estrogens (PREMARIN) vaginal cream, Place vaginally as needed. (Patient not taking: No sig reported), Disp: , Rfl:   EXAM:   General impression: alert, cooperative and articulate.  No signs of being in distress  Lungs: speech is fluent sentence length suggests that patient is not short of breath and not punctuated by cough, sneezing or sniffing. Marland Kitchen   Psych: affect normal.  speech is articulate and non pressured .  Denies suicidal thoughts   ASSESSMENT AND PLAN:  Discussed the following assessment and plan:  Acute non-recurrent maxillary sinusitis  Sinusitis, acute, maxillary Given chronicity of symptoms, development of facial pain and history consistent with bacterial URI,  Will treat with empiric antibiotics, decongestants, prednisone taper, cough suppressant .   Probiotic advised     I discussed the assessment and treatment plan with the patient. The patient was provided an opportunity to ask questions and all were answered. The patient agreed with the plan and demonstrated an understanding of the instructions.   The patient was advised to call back or seek an in-person evaluation if the symptoms worsen or if the condition fails to improve as anticipated.   I spent 22  minutes dedicated to the care of this  patient on the date of this encounter to include pre-visit review of his medical history,  non Face-to-face time with the patient , and post visit ordering of testing and therapeutics.    Crecencio Mc, MD

## 2021-09-07 NOTE — Patient Instructions (Addendum)
am treating you for bacterial sinusitis which is probably a  complication from your  viral  infection .   I am prescribing an antibiotic (amoxicillin)   To manage the infection and the inflammation in your ear/sinuses.   Use Afrin nasal spray in each nostril every 12 hours for 5 days ,  then stop.   You can start the prednisone after a few days  if no better.    Robitussin DN or Delsym for the cough  I also advise use of the following OTC meds to help with your other symptoms.    Please take a probiotic ( Align, Flora que or Culturelle) OR A GENERIC EQUIVALENT for three weeks since you are taking an  antibiotic to prevent a very serious antibiotic associated infection  Called clostridium dificile colitis that can cause diarrhea, multi organ failure, sepsis and death if not managed.

## 2021-09-13 ENCOUNTER — Ambulatory Visit: Payer: Medicare Other

## 2021-09-16 ENCOUNTER — Encounter (INDEPENDENT_AMBULATORY_CARE_PROVIDER_SITE_OTHER): Payer: Medicare Other

## 2021-09-16 ENCOUNTER — Other Ambulatory Visit (INDEPENDENT_AMBULATORY_CARE_PROVIDER_SITE_OTHER): Payer: Medicare Other

## 2021-09-16 ENCOUNTER — Ambulatory Visit (INDEPENDENT_AMBULATORY_CARE_PROVIDER_SITE_OTHER): Payer: Medicare Other | Admitting: Vascular Surgery

## 2021-09-20 ENCOUNTER — Ambulatory Visit: Payer: Medicare Other | Admitting: *Deleted

## 2021-09-20 DIAGNOSIS — F409 Phobic anxiety disorder, unspecified: Secondary | ICD-10-CM

## 2021-09-20 DIAGNOSIS — I2510 Atherosclerotic heart disease of native coronary artery without angina pectoris: Secondary | ICD-10-CM

## 2021-09-20 DIAGNOSIS — N1831 Chronic kidney disease, stage 3a: Secondary | ICD-10-CM

## 2021-09-20 DIAGNOSIS — G459 Transient cerebral ischemic attack, unspecified: Secondary | ICD-10-CM

## 2021-09-20 DIAGNOSIS — J0100 Acute maxillary sinusitis, unspecified: Secondary | ICD-10-CM

## 2021-09-20 DIAGNOSIS — I739 Peripheral vascular disease, unspecified: Secondary | ICD-10-CM

## 2021-09-20 DIAGNOSIS — E1140 Type 2 diabetes mellitus with diabetic neuropathy, unspecified: Secondary | ICD-10-CM

## 2021-09-21 ENCOUNTER — Other Ambulatory Visit: Payer: Self-pay | Admitting: Internal Medicine

## 2021-09-21 NOTE — Patient Instructions (Signed)
Visit Information  Current Barriers:   Acute Mental Health needs related to Coronary Artery Disease Involving Native Coronary Artery of Native Heart, Stage II Chronic Kidney Disease, Type 2 Diabetes Mellitus with Chronic Kidney Disease, Peripheral Vascular Disease, Pure Hypercholesterolemia, Anxiety and Stress of Chronic Illness requires Support, Education, Resources, Referrals and Care Coordination in order to meet unmet mental health needs. Clinical Goal(s):  Patient will work with LCSW to reduce and manage symptoms of Anxiety and Stress, until manageable.    Patient will increase knowledge and/or ability of:  Coping Skills, Self-Management Skills and Stress Reduction.     Clinical Interventions:  Solution-Focused Therapy Performed, Emotional Support Provided, Cognitive Behavioral Therapy Initiated, and Verbalization of Feelings Encouraged.   Collaboration with Primary Care Physician, Dr. Deborra Medina regarding development and update of comprehensive plan of care as evidenced by provider attestation and co-signature. Patient Goals/Self-Care Activities: Continue to receive personal counseling with LCSW, on bi-weekly basis, to reduce and manage symptoms of Anxiety and Stress, until manageable, not interested in a referral to [facility] for ongoing mental health counseling and supportive services, or any other community agency provider.   Joint collaboration with representative from [facility]) [place] [phone]), to help you, as the Medicare beneficiary, better understand your coverage options and benefits. Congratulations on taking the initiative to reschedule all provider appointments missed while suffering from COVID-19.   Contact LCSW directly (# [id number]) if you have questions, need assistance, or if additional social work needs are identified between now and our next scheduled telephone outreach call. Follow-Up:  10/11/2021 at 10:00am  Patient  verbalizes understanding of instructions provided today and agrees to view in [place].   Telephone follow up appointment with care management team member scheduled for:  10/11/2021 at 10:00am  [place] Licensed Clinical Social Worker [place]  [phone]

## 2021-09-21 NOTE — Chronic Care Management (AMB) (Signed)
Chronic Care Management    Clinical Social Work Note  09/21/2021 Name: Sabrina Montoya MRN: [id number] DOB: 1941/11/05  Sabrina Montoya is a 80 y.o. year old female who is a primary care patient of Tullo, Aris Everts, MD. The CCM team was consulted to assist the patient with chronic disease management and/or care coordination needs related to: [employer] and [place] and Resources.   Engaged with patient by telephone for follow up visit in response to provider referral for social work chronic care management and care coordination services.   Consent to Services:  The patient was given information about Chronic Care Management services, agreed to services, and gave verbal consent prior to initiation of services.  Please see initial visit note for detailed documentation.   Patient agreed to services and consent obtained.   Assessment: Review of patient past medical history, allergies, medications, and health status, including review of relevant consultants reports was performed today as part of a comprehensive evaluation and provision of chronic care management and care coordination services.     SDOH (Social Determinants of Health) assessments and interventions performed:    Advanced Directives Status: Not addressed in this encounter.  CCM Care Plan  Allergies  Allergen Reactions   Telmisartan Other (See Comments)    Acute renal failure Renal issues   Niacin And Related Hives   Sertraline Hcl Other (See Comments)    Hallucinations,     Sulfa Drugs Cross Reactors Nausea Only   Tetracyclines & Related Hives   Tizanidine Other (See Comments)    Issues with kidney function   Latex Rash    RAST testing was NEGATIVE  for LATEX   Simvastatin Rash    *Antihyperlipidemics*; elevated LFT's.     Outpatient Encounter Medications as of 09/20/2021  Medication Sig   amLODipine (NORVASC) 5 MG tablet Take by mouth.   amoxicillin-clavulanate (AUGMENTIN) 875-125 MG tablet Take 1  tablet by mouth 2 (two) times daily.   aspirin 81 MG tablet Take 81 mg by mouth daily.   cholecalciferol (VITAMIN D) 1000 UNITS tablet Take 1,000 Units by mouth daily.   clonazePAM (KLONOPIN) 0.5 MG tablet Take by mouth.   conjugated estrogens (PREMARIN) vaginal cream Place vaginally as needed. (Patient not taking: No sig reported)   cyanocobalamin 1000 MCG tablet Take by mouth.   isosorbide mononitrate (IMDUR) 60 MG 24 hr tablet Take 1 tablet (60 mg total) by mouth daily.   metoprolol succinate (TOPROL-XL) 100 MG 24 hr tablet Take by mouth.   Multiple Vitamins-Minerals (PRESERVISION AREDS 2 PO) Take by mouth 2 (two) times daily.   nitroGLYCERIN (NITROSTAT) 0.4 MG SL tablet Place 1 tablet (0.4 mg total) under the tongue every 5 (five) minutes as needed for chest pain.   predniSONE (DELTASONE) 10 MG tablet 6 tablets on Day 1 , then reduce by 1 tablet daily until gone   rosuvastatin (CRESTOR) 40 MG tablet Take 1 tablet (40 mg total) by mouth daily.   Turmeric (QC TUMERIC COMPLEX PO) Take 1 mg by mouth daily.   [DISCONTINUED] omeprazole (PRILOSEC) 20 MG capsule TAKE 1 CAPSULE(20 MG) BY MOUTH DAILY   No facility-administered encounter medications on file as of 09/20/2021.    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 05/05/2021   Extensor tenosynovitis of right wrist  05/05/2021   Pain, dental 03/17/2021   Chronic thumb pain, right 05/15/2020   Syncope 12/31/2019   Cervical radiculitis 09/14/2019   Arrhythmia, sinus node 05/29/2019   Fatigue 06/07/2018   Insomnia due to anxiety and fear 04/20/2018   Chronic bladder pain 12/23/2017   Irritable bowel syndrome (IBS) 12/23/2017   Atrophic vaginitis 02/11/2017   CKD (chronic kidney disease) stage 3, GFR 30-59 ml/min (HCC)  08/24/2016   Dysphagia, pharyngoesophageal phase 06/20/2016   Sciatica, left side 06/20/2016   Controlled type 2 diabetes with neuropathy ([place]) 05/16/2016   Preventative health care 02/06/2016   Cervicalgia 01/09/2015   Cystocele 08/29/2014   Anemia 05/02/2014   TIA (transient ischemic attack) 09/11/2013   Overweight (BMI 25.0-29.9) 03/16/2013   Bilateral carotid artery stenosis 03/03/2013   Ulnar neuropathy of left upper extremity 03/03/2013   Chronic reflux esophagitis 12/29/2012   Right hip pain 12/29/2012   History of tobacco abuse 07/03/2012   AAA (abdominal aortic aneurysm) without rupture (HCC)    CAD (coronary artery disease), autologous vein bypass graft    Peripheral vascular disease ([place])    Arthritis 02/29/2012   Hyperlipidemia    Osteopenia 08/29/2011   Vitamin D deficiency 08/29/2011    Conditions to be addressed/monitored: DMII and Anxiety.  Limited Social Support, Mental Health Concerns, Social Isolation, Limited Access to Caregiver, and Lacks Knowledge of [employer].  Care Plan : LCSW Plan of Care  Updates made by Francis Gaines, LCSW since 09/21/2021 12:00 AM     Problem: Reduce and Manage My Symptoms of Anxiety and Stress.   Priority: High     Goal: Reduce and Manage My Symptoms of Anxiety and Stress.   Start Date: 07/21/2021  Expected End Date: 11/21/2021  This Visit's Progress: On track  Recent Progress: On track  Priority: High  Note:   Current Barriers:   Acute Mental Health needs related to Coronary Artery Disease Involving Native Coronary Artery of Native Heart, Stage II Chronic Kidney Disease, Type 2 Diabetes Mellitus with Chronic Kidney Disease, Peripheral Vascular Disease, Pure Hypercholesterolemia, Anxiety and Stress of Chronic Illness requires Support, Education, Resources, Referrals and Care Coordination in order to meet unmet mental health needs. Clinical Goal(s):  Patient will work with LCSW to reduce and manage symptoms of  Anxiety and Stress, until manageable.    Patient will increase knowledge and/or ability of:  Coping Skills, Self-Management Skills and Stress Reduction.     Clinical Interventions:  Solution-Focused Therapy Performed, Emotional Support Provided, Cognitive Behavioral Therapy Initiated, and Verbalization of Feelings Encouraged.   Collaboration with Primary Care Physician, Dr. Deborra Medina regarding development and update of comprehensive plan of care as evidenced by provider attestation and co-signature. Patient Goals/Self-Care Activities: Continue to receive personal counseling with LCSW, on bi-weekly basis, to reduce and manage symptoms of Anxiety and Stress, until manageable, not interested in a referral to [facility] for ongoing mental health counseling and supportive services, or any other community agency provider.   Joint collaboration with representative from [facility]) [place] [phone]), to help you, as the Medicare beneficiary, better understand your coverage options and benefits. Congratulations on taking the initiative to reschedule all provider appointments missed while suffering from COVID-19.   Contact LCSW directly (# [id number]) if you have questions, need assistance, or if additional social work needs are identified between now and our next scheduled telephone outreach call. Follow-Up:  10/11/2021 at 10:00am     [place] Clinical Social Worker [place]  (  [phone]   

## 2021-10-04 ENCOUNTER — Other Ambulatory Visit: Payer: Self-pay

## 2021-10-04 ENCOUNTER — Encounter: Payer: Medicare Other | Attending: Internal Medicine

## 2021-10-04 ENCOUNTER — Other Ambulatory Visit (INDEPENDENT_AMBULATORY_CARE_PROVIDER_SITE_OTHER): Payer: Self-pay | Admitting: Vascular Surgery

## 2021-10-04 VITALS — Ht 64.0 in | Wt 152.6 lb

## 2021-10-04 DIAGNOSIS — I214 Non-ST elevation (NSTEMI) myocardial infarction: Secondary | ICD-10-CM | POA: Insufficient documentation

## 2021-10-04 DIAGNOSIS — I714 Abdominal aortic aneurysm, without rupture, unspecified: Secondary | ICD-10-CM

## 2021-10-04 NOTE — Progress Notes (Signed)
Cardiac Individual Treatment Plan  Patient Details  Name: Sabrina Montoya MRN: [id number] Date of Birth: 1941/03/29 Referring Provider:   Flowsheet Row Cardiac Rehab from 10/04/2021 in [facility] Cardiac and Pulmonary Rehab  Referring Provider Harle Stanford MD       Initial Encounter Date:  Flowsheet Row Cardiac Rehab from 10/04/2021 in [facility] Cardiac and Pulmonary Rehab  Date 10/04/21       Visit Diagnosis: NSTEMI (non-ST elevated myocardial infarction) [facility])  Patient's Home Medications on Admission:  Current Outpatient Medications:    amLODipine (NORVASC) 5 MG tablet, Take by mouth., Disp: , Rfl:    amoxicillin-clavulanate (AUGMENTIN) 875-125 MG tablet, Take 1 tablet by mouth 2 (two) times daily., Disp: 14 tablet, Rfl: 0   aspirin 81 MG tablet, Take 81 mg by mouth daily., Disp: , Rfl:    cholecalciferol (VITAMIN D) 1000 UNITS tablet, Take 1,000 Units by mouth daily., Disp: , Rfl:    clonazePAM (KLONOPIN) 0.5 MG tablet, Take by mouth., Disp: , Rfl:    conjugated estrogens (PREMARIN) vaginal cream, Place vaginally as needed. (Patient not taking: No sig reported), Disp: , Rfl:    cyanocobalamin 1000 MCG tablet, Take by mouth., Disp: , Rfl:    isosorbide mononitrate (IMDUR) 60 MG 24 hr tablet, Take 1 tablet (60 mg total) by mouth daily., Disp: 30 tablet, Rfl: 0   metoprolol succinate (TOPROL-XL) 100 MG 24 hr tablet, Take by mouth., Disp: , Rfl:    Multiple Vitamins-Minerals (PRESERVISION AREDS 2 PO), Take by mouth 2 (two) times daily., Disp: , Rfl:    nitroGLYCERIN (NITROSTAT) 0.4 MG SL tablet, Place 1 tablet (0.4 mg total) under the tongue every 5 (five) minutes as needed for chest pain., Disp: 50 tablet, Rfl: 3   omeprazole (PRILOSEC) 20 MG capsule, TAKE 1 CAPSULE(20 MG) BY MOUTH DAILY, Disp: 90 capsule, Rfl: 1   predniSONE (DELTASONE) 10 MG tablet, 6 tablets on Day 1 , then reduce by 1 tablet daily until gone, Disp: 21 tablet, Rfl: 0   rosuvastatin (CRESTOR) 40 MG tablet, Take 1 tablet (40  mg total) by mouth daily., Disp: 90 tablet, Rfl: 0   Turmeric (QC TUMERIC COMPLEX PO), Take 1 mg by mouth daily., Disp: , Rfl:   Past Medical History: Past Medical History:  Diagnosis Date   Abdominal aortic aneurysm without mention of rupture    infrarenal, stable, folllowed by Leotis Pain   Acute posthemorrhagic anemia    Arthritis    B12 deficiency    CAD (coronary artery disease), autologous vein bypass graft    Cardiac dysrhythmia, unspecified    Colon polyp    Depression    Diverticulitis of colon    Dizziness    DUE TO MEDICINES   Dysrhythmia    GERD (gastroesophageal reflux disease)    HOH (hard of hearing)    Hyperlipidemia    Hypertension    pt denies. placed on meds after CABG   IBS (irritable bowel syndrome)    Neuropathy    Neuropathy 07/01/2013   Peripheral vascular disease ([place])    s/p CEA    Reflux esophagitis    Rheumatic fever    possible at age 59   Sliding hiatal hernia    Tobacco abuse    Tobacco abuse     Tobacco Use: Social History   Tobacco Use  Smoking Status Former   Packs/day: 0.50   Years: 30.00   Pack years: 15.00   Types: Cigarettes   Quit date: 04/01/2012   Years  since quitting: 9.5  Smokeless Tobacco Never  Tobacco Comments   quit for 2 years after sinus infection and 6 months after heart surgery    Labs: Recent Review Flowsheet Data     Labs for ITP Cardiac and Pulmonary Rehab Latest Ref Rng & Units 10/23/2018 05/20/2019 05/13/2020 04/27/2021 06/06/2021   Cholestrol 0 - 200 mg/dL 106 119 119 141 116   LDLCALC 0 - 99 mg/dL 32 57 44 63 48   LDLDIRECT mg/dL - - - - -   HDL >40 mg/dL 40.10 40.80 38.20(L) 44.60 44   Trlycerides <150 mg/dL 168.0(H) 106.0 186.0(H) 169.0(H) 122   Hemoglobin A1c 4.8 - 5.6 % 6.8(H) 6.7(H) 6.6(H) 7.3(H) 7.0(H)        Exercise Target Goals: Exercise Program Goal: Individual exercise prescription set using results from initial 6 min walk test and THRR while considering  patient's activity barriers and  safety.   Exercise Prescription Goal: Initial exercise prescription builds to 30-45 minutes a day of aerobic activity, 2-3 days per week.  Home exercise guidelines will be given to patient during program as part of exercise prescription that the participant will acknowledge.   Education: Aerobic Exercise: - Group verbal and visual presentation on the components of exercise prescription. Introduces F.I.T.T principle from ACSM for exercise prescriptions.  Reviews F.I.T.T. principles of aerobic exercise including progression. Written material given at graduation. Flowsheet Row Cardiac Rehab from 10/04/2021 in [facility] Cardiac and Pulmonary Rehab  Education need identified 10/04/21       Education: Resistance Exercise: - Group verbal and visual presentation on the components of exercise prescription. Introduces F.I.T.T principle from ACSM for exercise prescriptions  Reviews F.I.T.T. principles of resistance exercise including progression. Written material given at graduation.    Education: Exercise & Equipment Safety: - Individual verbal instruction and demonstration of equipment use and safety with use of the equipment. Flowsheet Row Cardiac Rehab from 08/18/2021 in [facility] Cardiac and Pulmonary Rehab  Date 08/18/21  Educator [facility]  Instruction Review Code 1- Verbalizes Understanding       Education: Exercise Physiology & General Exercise Guidelines: - Group verbal and written instruction with models to review the exercise physiology of the cardiovascular system and associated critical values. Provides general exercise guidelines with specific guidelines to those with heart or lung disease.    Education: Flexibility, Balance, Mind/Body Relaxation: - Group verbal and visual presentation with interactive activity on the components of exercise prescription. Introduces F.I.T.T principle from ACSM for exercise prescriptions. Reviews F.I.T.T. principles of flexibility and balance exercise training  including progression. Also discusses the mind body connection.  Reviews various relaxation techniques to help reduce and manage stress (i.e. Deep breathing, progressive muscle relaxation, and visualization). Balance handout provided to take home. Written material given at graduation.   Activity Barriers & Risk Stratification:  Activity Barriers & Cardiac Risk Stratification - 10/04/21 1448       Activity Barriers & Cardiac Risk Stratification   Activity Barriers Back Problems;Neck/Spine Problems;Arthritis;Deconditioning;Muscular Weakness;Other (comment)    Comments History of rotator cuff on right    Cardiac Risk Stratification High             6 Minute Walk:  6 Minute Walk     Row Name 10/04/21 1452         6 Minute Walk   Phase Initial     Distance 800 feet     Walk Time 5.5 minutes  Break 2:38-3:04     # of Rest Breaks 1  MPH 1.65     METS 1.32     RPE 12     Perceived Dyspnea  3     VO2 Peak 4.62     Symptoms Yes (comment)     Comments SOB     Resting HR 57 bpm     Resting BP 110/66     Resting Oxygen Saturation  97 %     Exercise Oxygen Saturation  during 6 min walk 100 %     Max Ex. HR 76 bpm     Max Ex. BP 132/66     2 Minute Post BP 112/62              Oxygen Initial Assessment:   Oxygen Re-Evaluation:   Oxygen Discharge (Final Oxygen Re-Evaluation):   Initial Exercise Prescription:  Initial Exercise Prescription - 10/04/21 1500       Date of Initial Exercise RX and Referring Provider   Date 10/04/21    Referring Provider Harle Stanford MD      Recumbant Bike   Level 1    RPM 60    Minutes 15    METs 1.3      NuStep   Level 1    SPM 80    Minutes 15    METs 1.3      T5 Nustep   Level 1    SPM 80    Minutes 15    METs 1.3      Track   Laps 13    Minutes 15    METs 1.71      Prescription Details   Frequency (times per week) 2    Duration Progress to 30 minutes of continuous aerobic without signs/symptoms of  physical distress      Intensity   THRR 40-80% of Max Heartrate 90-124    Ratings of Perceived Exertion 11-13    Perceived Dyspnea 0-4      Progression   Progression Continue progressive overload as per policy without signs/symptoms or physical distress.      Resistance Training   Training Prescription Yes    Weight 3 lb    Reps 10-15             Perform Capillary Blood Glucose checks as needed.  Exercise Prescription Changes:   Exercise Prescription Changes     Row Name 10/04/21 1500             Response to Exercise   Blood Pressure (Admit) 110/66       Blood Pressure (Exercise) 132/66       Blood Pressure (Exit) 112/62       Heart Rate (Admit) 57 bpm       Heart Rate (Exercise) 76 bpm       Heart Rate (Exit) 55 bpm       Oxygen Saturation (Admit) 97 %       Oxygen Saturation (Exercise) 100 %       Oxygen Saturation (Exit) 100 %       Rating of Perceived Exertion (Exercise) 12       Perceived Dyspnea (Exercise) 3       Symptoms SOB       Comments walk test results                Exercise Comments:   Exercise Goals and Review:   Exercise Goals     Row Name 10/04/21 1531  Exercise Goals   Increase Physical Activity Yes       Intervention Provide advice, education, support and counseling about physical activity/exercise needs.;Develop an individualized exercise prescription for aerobic and resistive training based on initial evaluation findings, risk stratification, comorbidities and participant's personal goals.       Expected Outcomes Short Term: Attend rehab on a regular basis to increase amount of physical activity.;Long Term: Add in home exercise to make exercise part of routine and to increase amount of physical activity.;Long Term: Exercising regularly at least 3-5 days a week.       Increase Strength and Stamina Yes       Intervention Provide advice, education, support and counseling about physical activity/exercise  needs.;Develop an individualized exercise prescription for aerobic and resistive training based on initial evaluation findings, risk stratification, comorbidities and participant's personal goals.       Expected Outcomes Short Term: Increase workloads from initial exercise prescription for resistance, speed, and METs.;Short Term: Perform resistance training exercises routinely during rehab and add in resistance training at home;Long Term: Improve cardiorespiratory fitness, muscular endurance and strength as measured by increased METs and functional capacity (6MWT)       Able to understand and use rate of perceived exertion (RPE) scale Yes       Intervention Provide education and explanation on how to use RPE scale       Expected Outcomes Short Term: Able to use RPE daily in rehab to express subjective intensity level;Long Term:  Able to use RPE to guide intensity level when exercising independently       Able to understand and use Dyspnea scale Yes       Intervention Provide education and explanation on how to use Dyspnea scale       Expected Outcomes Short Term: Able to use Dyspnea scale daily in rehab to express subjective sense of shortness of breath during exertion;Long Term: Able to use Dyspnea scale to guide intensity level when exercising independently       Knowledge and understanding of Target Heart Rate Range (THRR) Yes       Intervention Provide education and explanation of THRR including how the numbers were predicted and where they are located for reference       Expected Outcomes Short Term: Able to state/look up THRR;Long Term: Able to use THRR to govern intensity when exercising independently;Short Term: Able to use daily as guideline for intensity in rehab       Able to check pulse independently Yes       Intervention Provide education and demonstration on how to check pulse in carotid and radial arteries.;Review the importance of being able to check your own pulse for safety during  independent exercise       Expected Outcomes Short Term: Able to explain why pulse checking is important during independent exercise;Long Term: Able to check pulse independently and accurately       Understanding of Exercise Prescription Yes       Intervention Provide education, explanation, and written materials on patient's individual exercise prescription       Expected Outcomes Short Term: Able to explain program exercise prescription;Long Term: Able to explain home exercise prescription to exercise independently                Exercise Goals Re-Evaluation :   Discharge Exercise Prescription (Final Exercise Prescription Changes):  Exercise Prescription Changes - 10/04/21 1500       Response to Exercise   Blood Pressure (  Admit) 110/66    Blood Pressure (Exercise) 132/66    Blood Pressure (Exit) 112/62    Heart Rate (Admit) 57 bpm    Heart Rate (Exercise) 76 bpm    Heart Rate (Exit) 55 bpm    Oxygen Saturation (Admit) 97 %    Oxygen Saturation (Exercise) 100 %    Oxygen Saturation (Exit) 100 %    Rating of Perceived Exertion (Exercise) 12    Perceived Dyspnea (Exercise) 3    Symptoms SOB    Comments walk test results             Nutrition:  Target Goals: Understanding of nutrition guidelines, daily intake of sodium 1500mg , cholesterol 200mg , calories 30% from fat and 7% or less from saturated fats, daily to have 5 or more servings of fruits and vegetables.  Education: All About Nutrition: -Group instruction provided by verbal, written material, interactive activities, discussions, models, and posters to present general guidelines for heart healthy nutrition including fat, fiber, MyPlate, the role of sodium in heart healthy nutrition, utilization of the nutrition label, and utilization of this knowledge for meal planning. Follow up email sent as well. Written material given at graduation. Flowsheet Row Cardiac Rehab from 10/04/2021 in [facility] Cardiac and Pulmonary Rehab   Education need identified 10/04/21       Biometrics:  Pre Biometrics - 10/04/21 1450       Pre Biometrics   Height 5\' 4"  (1.626 m)    Weight 152 lb 9.6 oz (69.2 kg)    BMI (Calculated) 26.18    Single Leg Stand 12.4 seconds              Nutrition Therapy Plan and Nutrition Goals:   Nutrition Assessments:  MEDIFICTS Score Key: ?70 Need to make dietary changes  40-70 Heart Healthy Diet ? 40 Therapeutic Level Cholesterol Diet  Flowsheet Row Cardiac Rehab from 10/04/2021 in [facility] Cardiac and Pulmonary Rehab  Picture Your Plate Total Score on Admission 66      Picture Your Plate Scores: <45 Unhealthy dietary pattern with much room for improvement. 41-50 Dietary pattern unlikely to meet recommendations for good health and room for improvement. 51-60 More healthful dietary pattern, with some room for improvement.  >60 Healthy dietary pattern, although there may be some specific behaviors that could be improved.    Nutrition Goals Re-Evaluation:   Nutrition Goals Discharge (Final Nutrition Goals Re-Evaluation):   Psychosocial: Target Goals: Acknowledge presence or absence of significant depression and/or stress, maximize coping skills, provide positive support system. Participant is able to verbalize types and ability to use techniques and skills needed for reducing stress and depression.   Education: Stress, Anxiety, and Depression - Group verbal and visual presentation to define topics covered.  Reviews how body is impacted by stress, anxiety, and depression.  Also discusses healthy ways to reduce stress and to treat/manage anxiety and depression.  Written material given at graduation.   Education: Sleep Hygiene -Provides group verbal and written instruction about how sleep can affect your health.  Define sleep hygiene, discuss sleep cycles and impact of sleep habits. Review good sleep hygiene tips.    Initial Review & Psychosocial Screening:  Initial Psych  Review & Screening - 08/18/21 1042       Initial Review   Current issues with Current Anxiety/Panic      Family Dynamics   Good Support System? Yes    Comments She can look to her two daughters for support. Her family support is up and  down. If she needs something they will be there.      Barriers   Psychosocial barriers to participate in program The patient should benefit from training in stress management and relaxation.      Screening Interventions   Interventions Encouraged to exercise;Provide feedback about the scores to participant;To provide support and resources with identified psychosocial needs    Expected Outcomes Short Term goal: Utilizing psychosocial counselor, staff and physician to assist with identification of specific Stressors or current issues interfering with healing process. Setting desired goal for each stressor or current issue identified.;Long Term Goal: Stressors or current issues are controlled or eliminated.;Short Term goal: Identification and review with participant of any Quality of Life or Depression concerns found by scoring the questionnaire.;Long Term goal: The participant improves quality of Life and PHQ9 Scores as seen by post scores and/or verbalization of changes             Quality of Life Scores:   Quality of Life - 10/04/21 1519       Quality of Life   Select Quality of Life      Quality of Life Scores   Health/Function Pre 17.57 %    Socioeconomic Pre 19.33 %    Psych/Spiritual Pre 22.93 %    Family Pre 22.5 %    GLOBAL Pre 19.76 %            Scores of 19 and below usually indicate a poorer quality of life in these areas.  A difference of  2-3 points is a clinically meaningful difference.  A difference of 2-3 points in the total score of the Quality of Life Index has been associated with significant improvement in overall quality of life, self-image, physical symptoms, and general health in studies assessing change in quality of  life.  PHQ-9: Recent Review Flowsheet Data     Depression screen [facility] 2/9 10/04/2021 09/07/2021 08/18/2021 07/22/2021 06/30/2021   Decreased Interest 1 0 0 0 0   Down, Depressed, Hopeless 0 0 0 1 0   PHQ - 2 Score 1 0 0 1 0   Altered sleeping 1 - - - -   Tired, decreased energy 1 - - - -   Change in appetite 1 - - - -   Feeling bad or failure about yourself  0 - - - -   Trouble concentrating 1 - - - -   Moving slowly or fidgety/restless 0 - - - -   Suicidal thoughts 0 - - - -   PHQ-9 Score 5 - - - -   Difficult doing work/chores Not difficult at all - - - -      Interpretation of Total Score  Total Score Depression Severity:  1-4 = Minimal depression, 5-9 = Mild depression, 10-14 = Moderate depression, 15-19 = Moderately severe depression, 20-27 = Severe depression   Psychosocial Evaluation and Intervention:  Psychosocial Evaluation - 08/18/21 1044       Psychosocial Evaluation & Interventions   Interventions Encouraged to exercise with the program and follow exercise prescription;Stress management education;Relaxation education    Comments She can look to her two daughters for support. Her family support is up and down. If she needs something they will be there.    Expected Outcomes Short: Start HeartTrack to help with mood. Long: Maintain a healthy mental state    Continue Psychosocial Services  Follow up required by staff             Psychosocial Re-Evaluation:  Psychosocial Discharge (Final Psychosocial Re-Evaluation):   Vocational Rehabilitation: Provide vocational rehab assistance to qualifying candidates.   Vocational Rehab Evaluation & Intervention:   Education: Education Goals: Education classes will be provided on a variety of topics geared toward better understanding of heart health and risk factor modification. Participant will state understanding/return demonstration of topics presented as noted by education test scores.  Learning  Barriers/Preferences:  Learning Barriers/Preferences - 08/18/21 1041       Learning Barriers/Preferences   Learning Barriers None    Learning Preferences None             General Cardiac Education Topics:  AED/CPR: - Group verbal and written instruction with the use of models to demonstrate the basic use of the AED with the basic ABC's of resuscitation.   Anatomy and Cardiac Procedures: - Group verbal and visual presentation and models provide information about basic cardiac anatomy and function. Reviews the testing methods done to diagnose heart disease and the outcomes of the test results. Describes the treatment choices: Medical Management, Angioplasty, or Coronary Bypass Surgery for treating various heart conditions including Myocardial Infarction, Angina, Valve Disease, and Cardiac Arrhythmias.  Written material given at graduation.   Medication Safety: - Group verbal and visual instruction to review commonly prescribed medications for heart and lung disease. Reviews the medication, class of the drug, and side effects. Includes the steps to properly store meds and maintain the prescription regimen.  Written material given at graduation.   Intimacy: - Group verbal instruction through game format to discuss how heart and lung disease can affect sexual intimacy. Written material given at graduation..   Know Your Numbers and Heart Failure: - Group verbal and visual instruction to discuss disease risk factors for cardiac and pulmonary disease and treatment options.  Reviews associated critical values for Overweight/Obesity, Hypertension, Cholesterol, and Diabetes.  Discusses basics of heart failure: signs/symptoms and treatments.  Introduces Heart Failure Zone chart for action plan for heart failure.  Written material given at graduation.   Infection Prevention: - Provides verbal and written material to individual with discussion of infection control including proper hand washing  and proper equipment cleaning during exercise session. Flowsheet Row Cardiac Rehab from 08/18/2021 in [facility] Cardiac and Pulmonary Rehab  Date 08/18/21  Educator [facility]  Instruction Review Code 1- Verbalizes Understanding       Falls Prevention: - Provides verbal and written material to individual with discussion of falls prevention and safety. Flowsheet Row Cardiac Rehab from 08/18/2021 in [facility] Cardiac and Pulmonary Rehab  Date 08/18/21  Educator [facility]  Instruction Review Code 1- Verbalizes Understanding       Other: -Provides group and verbal instruction on various topics (see comments)   Knowledge Questionnaire Score:  Knowledge Questionnaire Score - 10/04/21 1454       Knowledge Questionnaire Score   Pre Score 22/26: Angina, Aerobic exercise, Nutrition             Core Components/Risk Factors/Patient Goals at Admission:  Personal Goals and Risk Factors at Admission - 10/04/21 1531       Core Components/Risk Factors/Patient Goals on Admission    Weight Management Yes;Weight Loss    Intervention Weight Management: Develop a combined nutrition and exercise program designed to reach desired caloric intake, while maintaining appropriate intake of nutrient and fiber, sodium and fats, and appropriate energy expenditure required for the weight goal.;Weight Management: Provide education and appropriate resources to help participant work on and attain dietary goals.;Weight Management/Obesity: Establish reasonable short term and  long term weight goals.    Admit Weight 152 lb (68.9 kg)    Goal Weight: Short Term 149 lb (67.6 kg)    Goal Weight: Long Term 145 lb (65.8 kg)    Expected Outcomes Short Term: Continue to assess and modify interventions until short term weight is achieved;Long Term: Adherence to nutrition and physical activity/exercise program aimed toward attainment of established weight goal;Weight Loss: Understanding of general recommendations for a balanced deficit meal  plan, which promotes 1-2 lb weight loss per week and includes a negative energy balance of [phone] kcal/d;Understanding recommendations for meals to include 15-35% energy as protein, 25-35% energy from fat, 35-60% energy from carbohydrates, less than 200mg  of dietary cholesterol, 20-35 gm of total fiber daily;Understanding of distribution of calorie intake throughout the day with the consumption of 4-5 meals/snacks    Diabetes Yes    Intervention Provide education about signs/symptoms and action to take for hypo/hyperglycemia.;Provide education about proper nutrition, including hydration, and aerobic/resistive exercise prescription along with prescribed medications to achieve blood glucose in normal ranges: Fasting glucose 65-99 mg/dL    Expected Outcomes Short Term: Participant verbalizes understanding of the signs/symptoms and immediate care of hyper/hypoglycemia, proper foot care and importance of medication, aerobic/resistive exercise and nutrition plan for blood glucose control.;Long Term: Attainment of HbA1C < 7%.    Hypertension Yes    Intervention Provide education on lifestyle modifcations including regular physical activity/exercise, weight management, moderate sodium restriction and increased consumption of fresh fruit, vegetables, and low fat dairy, alcohol moderation, and smoking cessation.;Monitor prescription use compliance.    Expected Outcomes Short Term: Continued assessment and intervention until BP is < 140/42mm HG in hypertensive participants. < 130/66mm HG in hypertensive participants with diabetes, heart failure or chronic kidney disease.;Long Term: Maintenance of blood pressure at goal levels.    Lipids Yes    Intervention Provide education and support for participant on nutrition & aerobic/resistive exercise along with prescribed medications to achieve LDL 70mg , HDL >40mg .    Expected Outcomes Short Term: Participant states understanding of desired cholesterol values and is  compliant with medications prescribed. Participant is following exercise prescription and nutrition guidelines.;Long Term: Cholesterol controlled with medications as prescribed, with individualized exercise RX and with personalized nutrition plan. Value goals: LDL < 70mg , HDL > 40 mg.             Education:Diabetes - Individual verbal and written instruction to review signs/symptoms of diabetes, desired ranges of glucose level fasting, after meals and with exercise. Acknowledge that pre and post exercise glucose checks will be done for 3 sessions at entry of program. [place] from 08/18/2021 in [facility]  Date 08/18/21  Educator [facility]  Instruction Review Code 1- Verbalizes Understanding       Core Components/Risk Factors/Patient Goals Review:    Core Components/Risk Factors/Patient Goals at Discharge (Final Review):    ITP Comments:  ITP Comments     [place] Name 08/18/21 1047 10/04/21 1151         ITP Comments Virtual Visit completed. Patient informed on EP and RD appointment and 6 Minute walk test. Patient also informed of patient health questionnaires on My Chart. Patient Verbalizes understanding. Visit diagnosis can be found in [facility] 06/05/2021. Completed 6MWT and gym orientation. Initial ITP created and sent for review to Dr. Emily Filbert, Medical Director.               Comments: Initial ITP

## 2021-10-04 NOTE — Patient Instructions (Signed)
Patient Instructions  Patient Details  Name: Sabrina Montoya MRN: [id number] Date of Birth: 07-12-1941 Referring Provider:  Vergia Alcon, MD  Below are your personal goals for exercise, nutrition, and risk factors. Our goal is to help you stay on track towards obtaining and maintaining these goals. We will be discussing your progress on these goals with you throughout the program.  Initial Exercise Prescription:  Initial Exercise Prescription - 10/04/21 1500       Date of Initial Exercise RX and Referring Provider   Date 10/04/21    Referring Provider Harle Stanford MD      Recumbant Bike   Level 1    RPM 60    Minutes 15    METs 1.3      NuStep   Level 1    SPM 80    Minutes 15    METs 1.3      T5 Nustep   Level 1    SPM 80    Minutes 15    METs 1.3      Track   Laps 13    Minutes 15    METs 1.71      Prescription Details   Frequency (times per week) 2    Duration Progress to 30 minutes of continuous aerobic without signs/symptoms of physical distress      Intensity   THRR 40-80% of Max Heartrate 90-124    Ratings of Perceived Exertion 11-13    Perceived Dyspnea 0-4      Progression   Progression Continue progressive overload as per policy without signs/symptoms or physical distress.      Resistance Training   Training Prescription Yes    Weight 3 lb    Reps 10-15             Exercise Goals: Frequency: Be able to perform aerobic exercise two to three times per week in program working toward 2-5 days per week of home exercise.  Intensity: Work with a perceived exertion of 11 (fairly light) - 15 (hard) while following your exercise prescription.  We will make changes to your prescription with you as you progress through the program.   Duration: Be able to do 30 to 45 minutes of continuous aerobic exercise in addition to a 5 minute warm-up and a 5 minute cool-down routine.   Nutrition Goals: Your personal nutrition goals will be established when  you do your nutrition analysis with the dietician.  The following are general nutrition guidelines to follow: Cholesterol < 200mg /day Sodium < 1500mg /day Fiber: Women over 50 yrs - 21 grams per day  Personal Goals:  Personal Goals and Risk Factors at Admission - 10/04/21 1531       Core Components/Risk Factors/Patient Goals on Admission    Weight Management Yes;Weight Loss    Intervention Weight Management: Develop a combined nutrition and exercise program designed to reach desired caloric intake, while maintaining appropriate intake of nutrient and fiber, sodium and fats, and appropriate energy expenditure required for the weight goal.;Weight Management: Provide education and appropriate resources to help participant work on and attain dietary goals.;Weight Management/Obesity: Establish reasonable short term and long term weight goals.    Admit Weight 152 lb (68.9 kg)    Goal Weight: Short Term 149 lb (67.6 kg)    Goal Weight: Long Term 145 lb (65.8 kg)    Expected Outcomes Short Term: Continue to assess and modify interventions until short term weight is achieved;Long Term: Adherence to nutrition and physical activity/exercise  program aimed toward attainment of established weight goal;Weight Loss: Understanding of general recommendations for a balanced deficit meal plan, which promotes 1-2 lb weight loss per week and includes a negative energy balance of [phone] kcal/d;Understanding recommendations for meals to include 15-35% energy as protein, 25-35% energy from fat, 35-60% energy from carbohydrates, less than 200mg  of dietary cholesterol, 20-35 gm of total fiber daily;Understanding of distribution of calorie intake throughout the day with the consumption of 4-5 meals/snacks    Diabetes Yes    Intervention Provide education about signs/symptoms and action to take for hypo/hyperglycemia.;Provide education about proper nutrition, including hydration, and aerobic/resistive exercise prescription  along with prescribed medications to achieve blood glucose in normal ranges: Fasting glucose 65-99 mg/dL    Expected Outcomes Short Term: Participant verbalizes understanding of the signs/symptoms and immediate care of hyper/hypoglycemia, proper foot care and importance of medication, aerobic/resistive exercise and nutrition plan for blood glucose control.;Long Term: Attainment of HbA1C < 7%.    Hypertension Yes    Intervention Provide education on lifestyle modifcations including regular physical activity/exercise, weight management, moderate sodium restriction and increased consumption of fresh fruit, vegetables, and low fat dairy, alcohol moderation, and smoking cessation.;Monitor prescription use compliance.    Expected Outcomes Short Term: Continued assessment and intervention until BP is < 140/71mm HG in hypertensive participants. < 130/66mm HG in hypertensive participants with diabetes, heart failure or chronic kidney disease.;Long Term: Maintenance of blood pressure at goal levels.    Lipids Yes    Intervention Provide education and support for participant on nutrition & aerobic/resistive exercise along with prescribed medications to achieve LDL 70mg , HDL >40mg .    Expected Outcomes Short Term: Participant states understanding of desired cholesterol values and is compliant with medications prescribed. Participant is following exercise prescription and nutrition guidelines.;Long Term: Cholesterol controlled with medications as prescribed, with individualized exercise RX and with personalized nutrition plan. Value goals: LDL < 70mg , HDL > 40 mg.             Tobacco Use Initial Evaluation: Social History   Tobacco Use  Smoking Status Former   Packs/day: 0.50   Years: 30.00   Pack years: 15.00   Types: Cigarettes   Quit date: 04/01/2012   Years since quitting: 9.5  Smokeless Tobacco Never  Tobacco Comments   quit for 2 years after sinus infection and 6 months after heart surgery     Exercise Goals and Review:  Exercise Goals     [place] Name 10/04/21 1531             Exercise Goals   Increase Physical Activity Yes       Intervention Provide advice, education, support and counseling about physical activity/exercise needs.;Develop an individualized exercise prescription for aerobic and resistive training based on initial evaluation findings, risk stratification, comorbidities and participant's personal goals.       Expected Outcomes Short Term: Attend rehab on a regular basis to increase amount of physical activity.;Long Term: Add in home exercise to make exercise part of routine and to increase amount of physical activity.;Long Term: Exercising regularly at least 3-5 days a week.       Increase Strength and Stamina Yes       Intervention Provide advice, education, support and counseling about physical activity/exercise needs.;Develop an individualized exercise prescription for aerobic and resistive training based on initial evaluation findings, risk stratification, comorbidities and participant's personal goals.       Expected Outcomes Short Term: Increase workloads from initial exercise prescription for resistance,  speed, and METs.;Short Term: Perform resistance training exercises routinely during rehab and add in resistance training at home;Long Term: Improve cardiorespiratory fitness, muscular endurance and strength as measured by increased METs and functional capacity (6MWT)       Able to understand and use rate of perceived exertion (RPE) scale Yes       Intervention Provide education and explanation on how to use RPE scale       Expected Outcomes Short Term: Able to use RPE daily in rehab to express subjective intensity level;Long Term:  Able to use RPE to guide intensity level when exercising independently       Able to understand and use Dyspnea scale Yes       Intervention Provide education and explanation on how to use Dyspnea scale       Expected Outcomes Short  Term: Able to use Dyspnea scale daily in rehab to express subjective sense of shortness of breath during exertion;Long Term: Able to use Dyspnea scale to guide intensity level when exercising independently       Knowledge and understanding of Target Heart Rate Range (THRR) Yes       Intervention Provide education and explanation of THRR including how the numbers were predicted and where they are located for reference       Expected Outcomes Short Term: Able to state/look up THRR;Long Term: Able to use THRR to govern intensity when exercising independently;Short Term: Able to use daily as guideline for intensity in rehab       Able to check pulse independently Yes       Intervention Provide education and demonstration on how to check pulse in carotid and radial arteries.;Review the importance of being able to check your own pulse for safety during independent exercise       Expected Outcomes Short Term: Able to explain why pulse checking is important during independent exercise;Long Term: Able to check pulse independently and accurately       Understanding of Exercise Prescription Yes       Intervention Provide education, explanation, and written materials on patient's individual exercise prescription       Expected Outcomes Short Term: Able to explain program exercise prescription;Long Term: Able to explain home exercise prescription to exercise independently                Copy of goals given to participant.

## 2021-10-05 ENCOUNTER — Encounter: Payer: Self-pay | Admitting: *Deleted

## 2021-10-05 DIAGNOSIS — N1831 Chronic kidney disease, stage 3a: Secondary | ICD-10-CM | POA: Diagnosis not present

## 2021-10-05 DIAGNOSIS — I2510 Atherosclerotic heart disease of native coronary artery without angina pectoris: Secondary | ICD-10-CM | POA: Diagnosis not present

## 2021-10-05 DIAGNOSIS — E1140 Type 2 diabetes mellitus with diabetic neuropathy, unspecified: Secondary | ICD-10-CM

## 2021-10-05 DIAGNOSIS — I214 Non-ST elevation (NSTEMI) myocardial infarction: Secondary | ICD-10-CM

## 2021-10-05 DIAGNOSIS — G459 Transient cerebral ischemic attack, unspecified: Secondary | ICD-10-CM

## 2021-10-05 NOTE — Progress Notes (Signed)
Cardiac Individual Treatment Plan  Patient Details  Name: Sabrina Montoya MRN: [id number] Date of Birth: 04/27/1941 Referring Provider:   Flowsheet Row Cardiac Rehab from 10/04/2021 in [facility] Cardiac and Pulmonary Rehab  Referring Provider Harle Stanford MD       Initial Encounter Date:  Flowsheet Row Cardiac Rehab from 10/04/2021 in [facility] Cardiac and Pulmonary Rehab  Date 10/04/21       Visit Diagnosis: NSTEMI (non-ST elevated myocardial infarction) [facility])  Patient's Home Medications on Admission:  Current Outpatient Medications:    amLODipine (NORVASC) 5 MG tablet, Take by mouth., Disp: , Rfl:    amoxicillin-clavulanate (AUGMENTIN) 875-125 MG tablet, Take 1 tablet by mouth 2 (two) times daily., Disp: 14 tablet, Rfl: 0   aspirin 81 MG tablet, Take 81 mg by mouth daily., Disp: , Rfl:    cholecalciferol (VITAMIN D) 1000 UNITS tablet, Take 1,000 Units by mouth daily., Disp: , Rfl:    clonazePAM (KLONOPIN) 0.5 MG tablet, Take by mouth., Disp: , Rfl:    conjugated estrogens (PREMARIN) vaginal cream, Place vaginally as needed. (Patient not taking: No sig reported), Disp: , Rfl:    cyanocobalamin 1000 MCG tablet, Take by mouth., Disp: , Rfl:    isosorbide mononitrate (IMDUR) 60 MG 24 hr tablet, Take 1 tablet (60 mg total) by mouth daily., Disp: 30 tablet, Rfl: 0   metoprolol succinate (TOPROL-XL) 100 MG 24 hr tablet, Take by mouth., Disp: , Rfl:    Multiple Vitamins-Minerals (PRESERVISION AREDS 2 PO), Take by mouth 2 (two) times daily., Disp: , Rfl:    nitroGLYCERIN (NITROSTAT) 0.4 MG SL tablet, Place 1 tablet (0.4 mg total) under the tongue every 5 (five) minutes as needed for chest pain., Disp: 50 tablet, Rfl: 3   omeprazole (PRILOSEC) 20 MG capsule, TAKE 1 CAPSULE(20 MG) BY MOUTH DAILY, Disp: 90 capsule, Rfl: 1   predniSONE (DELTASONE) 10 MG tablet, 6 tablets on Day 1 , then reduce by 1 tablet daily until gone, Disp: 21 tablet, Rfl: 0   rosuvastatin (CRESTOR) 40 MG tablet, Take 1 tablet (40  mg total) by mouth daily., Disp: 90 tablet, Rfl: 0   Turmeric (QC TUMERIC COMPLEX PO), Take 1 mg by mouth daily., Disp: , Rfl:   Past Medical History: Past Medical History:  Diagnosis Date   Abdominal aortic aneurysm without mention of rupture    infrarenal, stable, folllowed by Leotis Pain   Acute posthemorrhagic anemia    Arthritis    B12 deficiency    CAD (coronary artery disease), autologous vein bypass graft    Cardiac dysrhythmia, unspecified    Colon polyp    Depression    Diverticulitis of colon    Dizziness    DUE TO MEDICINES   Dysrhythmia    GERD (gastroesophageal reflux disease)    HOH (hard of hearing)    Hyperlipidemia    Hypertension    pt denies. placed on meds after CABG   IBS (irritable bowel syndrome)    Neuropathy    Neuropathy 07/01/2013   Peripheral vascular disease ([place])    s/p CEA    Reflux esophagitis    Rheumatic fever    possible at age 8   Sliding hiatal hernia    Tobacco abuse    Tobacco abuse     Tobacco Use: Social History   Tobacco Use  Smoking Status Former   Packs/day: 0.50   Years: 30.00   Pack years: 15.00   Types: Cigarettes   Quit date: 04/01/2012   Years  since quitting: 9.5  Smokeless Tobacco Never  Tobacco Comments   quit for 2 years after sinus infection and 6 months after heart surgery    Labs: Recent Review Flowsheet Data     Labs for ITP Cardiac and Pulmonary Rehab Latest Ref Rng & Units 10/23/2018 05/20/2019 05/13/2020 04/27/2021 06/06/2021   Cholestrol 0 - 200 mg/dL 106 119 119 141 116   LDLCALC 0 - 99 mg/dL 32 57 44 63 48   LDLDIRECT mg/dL - - - - -   HDL >40 mg/dL 40.10 40.80 38.20(L) 44.60 44   Trlycerides <150 mg/dL 168.0(H) 106.0 186.0(H) 169.0(H) 122   Hemoglobin A1c 4.8 - 5.6 % 6.8(H) 6.7(H) 6.6(H) 7.3(H) 7.0(H)        Exercise Target Goals: Exercise Program Goal: Individual exercise prescription set using results from initial 6 min walk test and THRR while considering  patient's activity barriers and  safety.   Exercise Prescription Goal: Initial exercise prescription builds to 30-45 minutes a day of aerobic activity, 2-3 days per week.  Home exercise guidelines will be given to patient during program as part of exercise prescription that the participant will acknowledge.   Education: Aerobic Exercise: - Group verbal and visual presentation on the components of exercise prescription. Introduces F.I.T.T principle from ACSM for exercise prescriptions.  Reviews F.I.T.T. principles of aerobic exercise including progression. Written material given at graduation. Flowsheet Row Cardiac Rehab from 10/04/2021 in [facility] Cardiac and Pulmonary Rehab  Education need identified 10/04/21       Education: Resistance Exercise: - Group verbal and visual presentation on the components of exercise prescription. Introduces F.I.T.T principle from ACSM for exercise prescriptions  Reviews F.I.T.T. principles of resistance exercise including progression. Written material given at graduation.    Education: Exercise & Equipment Safety: - Individual verbal instruction and demonstration of equipment use and safety with use of the equipment. Flowsheet Row Cardiac Rehab from 08/18/2021 in [facility] Cardiac and Pulmonary Rehab  Date 08/18/21  Educator [facility]  Instruction Review Code 1- Verbalizes Understanding       Education: Exercise Physiology & General Exercise Guidelines: - Group verbal and written instruction with models to review the exercise physiology of the cardiovascular system and associated critical values. Provides general exercise guidelines with specific guidelines to those with heart or lung disease.    Education: Flexibility, Balance, Mind/Body Relaxation: - Group verbal and visual presentation with interactive activity on the components of exercise prescription. Introduces F.I.T.T principle from ACSM for exercise prescriptions. Reviews F.I.T.T. principles of flexibility and balance exercise training  including progression. Also discusses the mind body connection.  Reviews various relaxation techniques to help reduce and manage stress (i.e. Deep breathing, progressive muscle relaxation, and visualization). Balance handout provided to take home. Written material given at graduation.   Activity Barriers & Risk Stratification:  Activity Barriers & Cardiac Risk Stratification - 10/04/21 1448       Activity Barriers & Cardiac Risk Stratification   Activity Barriers Back Problems;Neck/Spine Problems;Arthritis;Deconditioning;Muscular Weakness;Other (comment)    Comments History of rotator cuff on right    Cardiac Risk Stratification High             6 Minute Walk:  6 Minute Walk     Row Name 10/04/21 1452         6 Minute Walk   Phase Initial     Distance 800 feet     Walk Time 5.5 minutes  Break 2:38-3:04     # of Rest Breaks 1  MPH 1.65     METS 1.32     RPE 12     Perceived Dyspnea  3     VO2 Peak 4.62     Symptoms Yes (comment)     Comments SOB     Resting HR 57 bpm     Resting BP 110/66     Resting Oxygen Saturation  97 %     Exercise Oxygen Saturation  during 6 min walk 100 %     Max Ex. HR 76 bpm     Max Ex. BP 132/66     2 Minute Post BP 112/62              Oxygen Initial Assessment:   Oxygen Re-Evaluation:   Oxygen Discharge (Final Oxygen Re-Evaluation):   Initial Exercise Prescription:  Initial Exercise Prescription - 10/04/21 1500       Date of Initial Exercise RX and Referring Provider   Date 10/04/21    Referring Provider Harle Stanford MD      Recumbant Bike   Level 1    RPM 60    Minutes 15    METs 1.3      NuStep   Level 1    SPM 80    Minutes 15    METs 1.3      T5 Nustep   Level 1    SPM 80    Minutes 15    METs 1.3      Track   Laps 13    Minutes 15    METs 1.71      Prescription Details   Frequency (times per week) 2    Duration Progress to 30 minutes of continuous aerobic without signs/symptoms of  physical distress      Intensity   THRR 40-80% of Max Heartrate 90-124    Ratings of Perceived Exertion 11-13    Perceived Dyspnea 0-4      Progression   Progression Continue progressive overload as per policy without signs/symptoms or physical distress.      Resistance Training   Training Prescription Yes    Weight 3 lb    Reps 10-15             Perform Capillary Blood Glucose checks as needed.  Exercise Prescription Changes:   Exercise Prescription Changes     Row Name 10/04/21 1500             Response to Exercise   Blood Pressure (Admit) 110/66       Blood Pressure (Exercise) 132/66       Blood Pressure (Exit) 112/62       Heart Rate (Admit) 57 bpm       Heart Rate (Exercise) 76 bpm       Heart Rate (Exit) 55 bpm       Oxygen Saturation (Admit) 97 %       Oxygen Saturation (Exercise) 100 %       Oxygen Saturation (Exit) 100 %       Rating of Perceived Exertion (Exercise) 12       Perceived Dyspnea (Exercise) 3       Symptoms SOB       Comments walk test results                Exercise Comments:   Exercise Goals and Review:   Exercise Goals     Row Name 10/04/21 1531  Exercise Goals   Increase Physical Activity Yes       Intervention Provide advice, education, support and counseling about physical activity/exercise needs.;Develop an individualized exercise prescription for aerobic and resistive training based on initial evaluation findings, risk stratification, comorbidities and participant's personal goals.       Expected Outcomes Short Term: Attend rehab on a regular basis to increase amount of physical activity.;Long Term: Add in home exercise to make exercise part of routine and to increase amount of physical activity.;Long Term: Exercising regularly at least 3-5 days a week.       Increase Strength and Stamina Yes       Intervention Provide advice, education, support and counseling about physical activity/exercise  needs.;Develop an individualized exercise prescription for aerobic and resistive training based on initial evaluation findings, risk stratification, comorbidities and participant's personal goals.       Expected Outcomes Short Term: Increase workloads from initial exercise prescription for resistance, speed, and METs.;Short Term: Perform resistance training exercises routinely during rehab and add in resistance training at home;Long Term: Improve cardiorespiratory fitness, muscular endurance and strength as measured by increased METs and functional capacity (6MWT)       Able to understand and use rate of perceived exertion (RPE) scale Yes       Intervention Provide education and explanation on how to use RPE scale       Expected Outcomes Short Term: Able to use RPE daily in rehab to express subjective intensity level;Long Term:  Able to use RPE to guide intensity level when exercising independently       Able to understand and use Dyspnea scale Yes       Intervention Provide education and explanation on how to use Dyspnea scale       Expected Outcomes Short Term: Able to use Dyspnea scale daily in rehab to express subjective sense of shortness of breath during exertion;Long Term: Able to use Dyspnea scale to guide intensity level when exercising independently       Knowledge and understanding of Target Heart Rate Range (THRR) Yes       Intervention Provide education and explanation of THRR including how the numbers were predicted and where they are located for reference       Expected Outcomes Short Term: Able to state/look up THRR;Long Term: Able to use THRR to govern intensity when exercising independently;Short Term: Able to use daily as guideline for intensity in rehab       Able to check pulse independently Yes       Intervention Provide education and demonstration on how to check pulse in carotid and radial arteries.;Review the importance of being able to check your own pulse for safety during  independent exercise       Expected Outcomes Short Term: Able to explain why pulse checking is important during independent exercise;Long Term: Able to check pulse independently and accurately       Understanding of Exercise Prescription Yes       Intervention Provide education, explanation, and written materials on patient's individual exercise prescription       Expected Outcomes Short Term: Able to explain program exercise prescription;Long Term: Able to explain home exercise prescription to exercise independently                Exercise Goals Re-Evaluation :   Discharge Exercise Prescription (Final Exercise Prescription Changes):  Exercise Prescription Changes - 10/04/21 1500       Response to Exercise   Blood Pressure (  Admit) 110/66    Blood Pressure (Exercise) 132/66    Blood Pressure (Exit) 112/62    Heart Rate (Admit) 57 bpm    Heart Rate (Exercise) 76 bpm    Heart Rate (Exit) 55 bpm    Oxygen Saturation (Admit) 97 %    Oxygen Saturation (Exercise) 100 %    Oxygen Saturation (Exit) 100 %    Rating of Perceived Exertion (Exercise) 12    Perceived Dyspnea (Exercise) 3    Symptoms SOB    Comments walk test results             Nutrition:  Target Goals: Understanding of nutrition guidelines, daily intake of sodium 1500mg , cholesterol 200mg , calories 30% from fat and 7% or less from saturated fats, daily to have 5 or more servings of fruits and vegetables.  Education: All About Nutrition: -Group instruction provided by verbal, written material, interactive activities, discussions, models, and posters to present general guidelines for heart healthy nutrition including fat, fiber, MyPlate, the role of sodium in heart healthy nutrition, utilization of the nutrition label, and utilization of this knowledge for meal planning. Follow up email sent as well. Written material given at graduation. Flowsheet Row Cardiac Rehab from 10/04/2021 in [facility] Cardiac and Pulmonary Rehab   Education need identified 10/04/21       Biometrics:  Pre Biometrics - 10/04/21 1450       Pre Biometrics   Height 5\' 4"  (1.626 m)    Weight 152 lb 9.6 oz (69.2 kg)    BMI (Calculated) 26.18    Single Leg Stand 12.4 seconds              Nutrition Therapy Plan and Nutrition Goals:   Nutrition Assessments:  MEDIFICTS Score Key: ?70 Need to make dietary changes  40-70 Heart Healthy Diet ? 40 Therapeutic Level Cholesterol Diet  Flowsheet Row Cardiac Rehab from 10/04/2021 in [facility] Cardiac and Pulmonary Rehab  Picture Your Plate Total Score on Admission 66      Picture Your Plate Scores: <89 Unhealthy dietary pattern with much room for improvement. 41-50 Dietary pattern unlikely to meet recommendations for good health and room for improvement. 51-60 More healthful dietary pattern, with some room for improvement.  >60 Healthy dietary pattern, although there may be some specific behaviors that could be improved.    Nutrition Goals Re-Evaluation:   Nutrition Goals Discharge (Final Nutrition Goals Re-Evaluation):   Psychosocial: Target Goals: Acknowledge presence or absence of significant depression and/or stress, maximize coping skills, provide positive support system. Participant is able to verbalize types and ability to use techniques and skills needed for reducing stress and depression.   Education: Stress, Anxiety, and Depression - Group verbal and visual presentation to define topics covered.  Reviews how body is impacted by stress, anxiety, and depression.  Also discusses healthy ways to reduce stress and to treat/manage anxiety and depression.  Written material given at graduation.   Education: Sleep Hygiene -Provides group verbal and written instruction about how sleep can affect your health.  Define sleep hygiene, discuss sleep cycles and impact of sleep habits. Review good sleep hygiene tips.    Initial Review & Psychosocial Screening:  Initial Psych  Review & Screening - 08/18/21 1042       Initial Review   Current issues with Current Anxiety/Panic      Family Dynamics   Good Support System? Yes    Comments She can look to her two daughters for support. Her family support is up and  down. If she needs something they will be there.      Barriers   Psychosocial barriers to participate in program The patient should benefit from training in stress management and relaxation.      Screening Interventions   Interventions Encouraged to exercise;Provide feedback about the scores to participant;To provide support and resources with identified psychosocial needs    Expected Outcomes Short Term goal: Utilizing psychosocial counselor, staff and physician to assist with identification of specific Stressors or current issues interfering with healing process. Setting desired goal for each stressor or current issue identified.;Long Term Goal: Stressors or current issues are controlled or eliminated.;Short Term goal: Identification and review with participant of any Quality of Life or Depression concerns found by scoring the questionnaire.;Long Term goal: The participant improves quality of Life and PHQ9 Scores as seen by post scores and/or verbalization of changes             Quality of Life Scores:   Quality of Life - 10/04/21 1519       Quality of Life   Select Quality of Life      Quality of Life Scores   Health/Function Pre 17.57 %    Socioeconomic Pre 19.33 %    Psych/Spiritual Pre 22.93 %    Family Pre 22.5 %    GLOBAL Pre 19.76 %            Scores of 19 and below usually indicate a poorer quality of life in these areas.  A difference of  2-3 points is a clinically meaningful difference.  A difference of 2-3 points in the total score of the Quality of Life Index has been associated with significant improvement in overall quality of life, self-image, physical symptoms, and general health in studies assessing change in quality of  life.  PHQ-9: Recent Review Flowsheet Data     Depression screen [facility] 2/9 10/04/2021 09/07/2021 08/18/2021 07/22/2021 06/30/2021   Decreased Interest 1 0 0 0 0   Down, Depressed, Hopeless 0 0 0 1 0   PHQ - 2 Score 1 0 0 1 0   Altered sleeping 1 - - - -   Tired, decreased energy 1 - - - -   Change in appetite 1 - - - -   Feeling bad or failure about yourself  0 - - - -   Trouble concentrating 1 - - - -   Moving slowly or fidgety/restless 0 - - - -   Suicidal thoughts 0 - - - -   PHQ-9 Score 5 - - - -   Difficult doing work/chores Not difficult at all - - - -      Interpretation of Total Score  Total Score Depression Severity:  1-4 = Minimal depression, 5-9 = Mild depression, 10-14 = Moderate depression, 15-19 = Moderately severe depression, 20-27 = Severe depression   Psychosocial Evaluation and Intervention:  Psychosocial Evaluation - 08/18/21 1044       Psychosocial Evaluation & Interventions   Interventions Encouraged to exercise with the program and follow exercise prescription;Stress management education;Relaxation education    Comments She can look to her two daughters for support. Her family support is up and down. If she needs something they will be there.    Expected Outcomes Short: Start HeartTrack to help with mood. Long: Maintain a healthy mental state    Continue Psychosocial Services  Follow up required by staff             Psychosocial Re-Evaluation:  Psychosocial Discharge (Final Psychosocial Re-Evaluation):   Vocational Rehabilitation: Provide vocational rehab assistance to qualifying candidates.   Vocational Rehab Evaluation & Intervention:   Education: Education Goals: Education classes will be provided on a variety of topics geared toward better understanding of heart health and risk factor modification. Participant will state understanding/return demonstration of topics presented as noted by education test scores.  Learning  Barriers/Preferences:  Learning Barriers/Preferences - 08/18/21 1041       Learning Barriers/Preferences   Learning Barriers None    Learning Preferences None             General Cardiac Education Topics:  AED/CPR: - Group verbal and written instruction with the use of models to demonstrate the basic use of the AED with the basic ABC's of resuscitation.   Anatomy and Cardiac Procedures: - Group verbal and visual presentation and models provide information about basic cardiac anatomy and function. Reviews the testing methods done to diagnose heart disease and the outcomes of the test results. Describes the treatment choices: Medical Management, Angioplasty, or Coronary Bypass Surgery for treating various heart conditions including Myocardial Infarction, Angina, Valve Disease, and Cardiac Arrhythmias.  Written material given at graduation.   Medication Safety: - Group verbal and visual instruction to review commonly prescribed medications for heart and lung disease. Reviews the medication, class of the drug, and side effects. Includes the steps to properly store meds and maintain the prescription regimen.  Written material given at graduation.   Intimacy: - Group verbal instruction through game format to discuss how heart and lung disease can affect sexual intimacy. Written material given at graduation..   Know Your Numbers and Heart Failure: - Group verbal and visual instruction to discuss disease risk factors for cardiac and pulmonary disease and treatment options.  Reviews associated critical values for Overweight/Obesity, Hypertension, Cholesterol, and Diabetes.  Discusses basics of heart failure: signs/symptoms and treatments.  Introduces Heart Failure Zone chart for action plan for heart failure.  Written material given at graduation.   Infection Prevention: - Provides verbal and written material to individual with discussion of infection control including proper hand washing  and proper equipment cleaning during exercise session. Flowsheet Row Cardiac Rehab from 08/18/2021 in [facility] Cardiac and Pulmonary Rehab  Date 08/18/21  Educator [facility]  Instruction Review Code 1- Verbalizes Understanding       Falls Prevention: - Provides verbal and written material to individual with discussion of falls prevention and safety. Flowsheet Row Cardiac Rehab from 08/18/2021 in [facility] Cardiac and Pulmonary Rehab  Date 08/18/21  Educator [facility]  Instruction Review Code 1- Verbalizes Understanding       Other: -Provides group and verbal instruction on various topics (see comments)   Knowledge Questionnaire Score:  Knowledge Questionnaire Score - 10/04/21 1454       Knowledge Questionnaire Score   Pre Score 22/26: Angina, Aerobic exercise, Nutrition             Core Components/Risk Factors/Patient Goals at Admission:  Personal Goals and Risk Factors at Admission - 10/04/21 1531       Core Components/Risk Factors/Patient Goals on Admission    Weight Management Yes;Weight Loss    Intervention Weight Management: Develop a combined nutrition and exercise program designed to reach desired caloric intake, while maintaining appropriate intake of nutrient and fiber, sodium and fats, and appropriate energy expenditure required for the weight goal.;Weight Management: Provide education and appropriate resources to help participant work on and attain dietary goals.;Weight Management/Obesity: Establish reasonable short term and  long term weight goals.    Admit Weight 152 lb (68.9 kg)    Goal Weight: Short Term 149 lb (67.6 kg)    Goal Weight: Long Term 145 lb (65.8 kg)    Expected Outcomes Short Term: Continue to assess and modify interventions until short term weight is achieved;Long Term: Adherence to nutrition and physical activity/exercise program aimed toward attainment of established weight goal;Weight Loss: Understanding of general recommendations for a balanced deficit meal  plan, which promotes 1-2 lb weight loss per week and includes a negative energy balance of [phone] kcal/d;Understanding recommendations for meals to include 15-35% energy as protein, 25-35% energy from fat, 35-60% energy from carbohydrates, less than 200mg  of dietary cholesterol, 20-35 gm of total fiber daily;Understanding of distribution of calorie intake throughout the day with the consumption of 4-5 meals/snacks    Diabetes Yes    Intervention Provide education about signs/symptoms and action to take for hypo/hyperglycemia.;Provide education about proper nutrition, including hydration, and aerobic/resistive exercise prescription along with prescribed medications to achieve blood glucose in normal ranges: Fasting glucose 65-99 mg/dL    Expected Outcomes Short Term: Participant verbalizes understanding of the signs/symptoms and immediate care of hyper/hypoglycemia, proper foot care and importance of medication, aerobic/resistive exercise and nutrition plan for blood glucose control.;Long Term: Attainment of HbA1C < 7%.    Hypertension Yes    Intervention Provide education on lifestyle modifcations including regular physical activity/exercise, weight management, moderate sodium restriction and increased consumption of fresh fruit, vegetables, and low fat dairy, alcohol moderation, and smoking cessation.;Monitor prescription use compliance.    Expected Outcomes Short Term: Continued assessment and intervention until BP is < 140/33mm HG in hypertensive participants. < 130/5mm HG in hypertensive participants with diabetes, heart failure or chronic kidney disease.;Long Term: Maintenance of blood pressure at goal levels.    Lipids Yes    Intervention Provide education and support for participant on nutrition & aerobic/resistive exercise along with prescribed medications to achieve LDL 70mg , HDL >40mg .    Expected Outcomes Short Term: Participant states understanding of desired cholesterol values and is  compliant with medications prescribed. Participant is following exercise prescription and nutrition guidelines.;Long Term: Cholesterol controlled with medications as prescribed, with individualized exercise RX and with personalized nutrition plan. Value goals: LDL < 70mg , HDL > 40 mg.             Education:Diabetes - Individual verbal and written instruction to review signs/symptoms of diabetes, desired ranges of glucose level fasting, after meals and with exercise. Acknowledge that pre and post exercise glucose checks will be done for 3 sessions at entry of program. [place] from 08/18/2021 in [facility] Cardiac and Pulmonary Rehab  Date 08/18/21  Educator [facility]  Instruction Review Code 1- Verbalizes Understanding       Core Components/Risk Factors/Patient Goals Review:    Core Components/Risk Factors/Patient Goals at Discharge (Final Review):    ITP Comments:  ITP Comments     [place] Name 08/18/21 1047 10/04/21 1151 10/05/21 0952       ITP Comments Virtual Visit completed. Patient informed on EP and RD appointment and 6 Minute walk test. Patient also informed of patient health questionnaires on My Chart. Patient Verbalizes understanding. Visit diagnosis can be found in [facility] 06/05/2021. Completed 6MWT and gym orientation. Initial ITP created and sent for review to Dr. Emily Filbert, Medical Director. 30 Day review completed. Medical Director ITP review done, changes made as directed, and signed approval by Medical Director.    New to program  Comments:

## 2021-10-06 ENCOUNTER — Other Ambulatory Visit (INDEPENDENT_AMBULATORY_CARE_PROVIDER_SITE_OTHER): Payer: Medicare Other

## 2021-10-06 ENCOUNTER — Encounter (INDEPENDENT_AMBULATORY_CARE_PROVIDER_SITE_OTHER): Payer: Medicare Other

## 2021-10-06 ENCOUNTER — Encounter: Payer: Medicare Other | Attending: Internal Medicine

## 2021-10-06 ENCOUNTER — Other Ambulatory Visit: Payer: Self-pay

## 2021-10-06 DIAGNOSIS — Z5189 Encounter for other specified aftercare: Secondary | ICD-10-CM | POA: Diagnosis not present

## 2021-10-06 DIAGNOSIS — I252 Old myocardial infarction: Secondary | ICD-10-CM | POA: Insufficient documentation

## 2021-10-06 DIAGNOSIS — I214 Non-ST elevation (NSTEMI) myocardial infarction: Secondary | ICD-10-CM

## 2021-10-06 NOTE — Progress Notes (Signed)
Daily Session Note  Patient Details  Name: Sabrina Montoya MRN: [id number] Date of Birth: 01-07-1941 Referring Provider:   Flowsheet Row Cardiac Rehab from 10/04/2021 in [facility] Cardiac and Pulmonary Rehab  Referring Provider Harle Stanford MD       Encounter Date: 10/06/2021  Check In:  Session Check In - 10/06/21 1037       Check-In   Supervising physician immediately available to respond to emergencies See telemetry face sheet for immediately available ER MD    Location [facility]-Cardiac & Pulmonary Rehab    Staff Present Birdie Sons, MPA, RN;Meredith Sherryll Burger, RN BSN;Melissa Caiola, RDN, LDN;Joseph Hood, RCP,RRT,BSRT    Virtual Visit No    Medication changes reported     No    Fall or balance concerns reported    No    Warm-up and Cool-down Performed on first and last piece of equipment    Resistance Training Performed Yes    VAD Patient? No    PAD/SET Patient? No      Pain Assessment   Currently in Pain? No/denies                Social History   Tobacco Use  Smoking Status Former   Packs/day: 0.50   Years: 30.00   Pack years: 15.00   Types: Cigarettes   Quit date: 04/01/2012   Years since quitting: 9.5  Smokeless Tobacco Never  Tobacco Comments   quit for 2 years after sinus infection and 6 months after heart surgery    Goals Met:  Independence with exercise equipment Exercise tolerated well No report of concerns or symptoms today Strength training completed today  Goals Unmet:  Not Applicable  Comments: First full day of exercise!  Patient was oriented to gym and equipment including functions, settings, policies, and procedures.  Patient's individual exercise prescription and treatment plan were reviewed.  All starting workloads were established based on the results of the 6 minute walk test done at initial orientation visit.  The plan for exercise progression was also introduced and progression will be customized based on patient's performance and  goals.    Dr. Emily Filbert is Medical Director for [place].  Dr. Ottie Glazier is Medical Director for [facility] Pulmonary Rehabilitation.

## 2021-10-10 ENCOUNTER — Ambulatory Visit (INDEPENDENT_AMBULATORY_CARE_PROVIDER_SITE_OTHER): Payer: Medicare Other | Admitting: *Deleted

## 2021-10-10 DIAGNOSIS — G459 Transient cerebral ischemic attack, unspecified: Secondary | ICD-10-CM

## 2021-10-10 DIAGNOSIS — R5383 Other fatigue: Secondary | ICD-10-CM

## 2021-10-10 DIAGNOSIS — F409 Phobic anxiety disorder, unspecified: Secondary | ICD-10-CM

## 2021-10-10 DIAGNOSIS — I739 Peripheral vascular disease, unspecified: Secondary | ICD-10-CM

## 2021-10-10 DIAGNOSIS — R1314 Dysphagia, pharyngoesophageal phase: Secondary | ICD-10-CM

## 2021-10-10 DIAGNOSIS — N1831 Chronic kidney disease, stage 3a: Secondary | ICD-10-CM

## 2021-10-10 DIAGNOSIS — E7800 Pure hypercholesterolemia, unspecified: Secondary | ICD-10-CM

## 2021-10-10 NOTE — Patient Instructions (Addendum)
Visit Information  Thank you for taking time to visit with me today. Please don't hesitate to contact me if I can be of assistance to you before our next scheduled telephone appointment.  Following are the goals we discussed today:   Patient Goals/Self-Care Activities: Continue to receive personal counseling with LCSW, on bi-weekly basis, to reduce and manage symptoms of Anxiety and Stress, until manageable.     Contact LCSW directly (# [id number]) if you have questions, need assistance, or if additional social work needs are identified in the near future.   Congratulations on compliance with attending all scheduled Cardiac Rehabilitation Appointments, with the [facility], thus far, which takes place every Tuesday and Thursday until 01/31/2022.  Follow-Up Plan: No follow-required, per patient request.  Patient agreed to contact LCSW directly when she is ready to resume personal counseling services, "wanting to complete outpatient rehabilitation appointments and get through the holidays".   Please call the care guide team at [phone] if you need to cancel or reschedule your appointment.   If you are experiencing a Mental Health or [place] or need someone to talk to, please call the Suicide and Crisis Lifeline: 988 call the [place] National Suicide Prevention Lifeline: 919-793-8188 or TTY: 240-456-4484 TTY (714) 462-2491) to talk to a trained counselor call 1-800-273-TALK (toll free, 24 hour hotline) go to [facility] [address] [place] [phone]) call the [place]: [phone] call 911   Patient verbalizes understanding of instructions provided today and agrees to view in [place].   Nat Christen LCSW Licensed Clinical Social Worker [place]  [phone]

## 2021-10-10 NOTE — Chronic Care Management (AMB) (Signed)
Chronic Care Management    Clinical Social Work Note  10/10/2021 Name: Sabrina Montoya MRN: [id number] DOB: February 06, 1941  Sabrina Montoya is a 80 y.o. year old female who is a primary care patient of Derrel Nip, Aris Everts, MD. The CCM team was consulted to assist the patient with chronic disease management and/or care coordination needs related to: Appointment Scheduling Needs, [employer], and Mental Health Counseling and Resources.   Engaged with patient by telephone for follow up visit in response to provider referral for social work chronic care management and care coordination services.   Consent to Services:  The patient was given information about Chronic Care Management services, agreed to services, and gave verbal consent prior to initiation of services.  Please see initial visit note for detailed documentation.   Patient agreed to services and consent obtained.   Assessment: Review of patient past medical history, allergies, medications, and health status, including review of relevant consultants reports was performed today as part of a comprehensive evaluation and provision of chronic care management and care coordination services.     SDOH (Social Determinants of Health) assessments and interventions performed:    Advanced Directives Status: Not addressed in this encounter.  CCM Care Plan  Allergies  Allergen Reactions   Telmisartan Other (See Comments)    Acute renal failure Renal issues   Niacin And Related Hives   Sertraline Hcl Other (See Comments)    Hallucinations,     Sulfa Drugs Cross Reactors Nausea Only   Tetracyclines & Related Hives   Tizanidine Other (See Comments)    Issues with kidney function   Latex Rash    RAST testing was NEGATIVE  for LATEX   Simvastatin Rash    *Antihyperlipidemics*; elevated LFT's.     Outpatient Encounter Medications as of 10/10/2021  Medication Sig   amLODipine (NORVASC) 5 MG tablet Take by mouth.   amoxicillin-clavulanate  (AUGMENTIN) 875-125 MG tablet Take 1 tablet by mouth 2 (two) times daily.   aspirin 81 MG tablet Take 81 mg by mouth daily.   cholecalciferol (VITAMIN D) 1000 UNITS tablet Take 1,000 Units by mouth daily.   clonazePAM (KLONOPIN) 0.5 MG tablet Take by mouth.   conjugated estrogens (PREMARIN) vaginal cream Place vaginally as needed. (Patient not taking: No sig reported)   cyanocobalamin 1000 MCG tablet Take by mouth.   isosorbide mononitrate (IMDUR) 60 MG 24 hr tablet Take 1 tablet (60 mg total) by mouth daily.   metoprolol succinate (TOPROL-XL) 100 MG 24 hr tablet Take by mouth.   Multiple Vitamins-Minerals (PRESERVISION AREDS 2 PO) Take by mouth 2 (two) times daily.   nitroGLYCERIN (NITROSTAT) 0.4 MG SL tablet Place 1 tablet (0.4 mg total) under the tongue every 5 (five) minutes as needed for chest pain.   omeprazole (PRILOSEC) 20 MG capsule TAKE 1 CAPSULE(20 MG) BY MOUTH DAILY   predniSONE (DELTASONE) 10 MG tablet 6 tablets on Day 1 , then reduce by 1 tablet daily until gone   rosuvastatin (CRESTOR) 40 MG tablet Take 1 tablet (40 mg total) by mouth daily.   Turmeric (QC TUMERIC COMPLEX PO) Take 1 mg by mouth daily.   No facility-administered encounter medications on file as of 10/10/2021.    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 05/05/2021   Extensor tenosynovitis of  right wrist 05/05/2021   Pain, dental 03/17/2021   Chronic thumb pain, right 05/15/2020   Syncope 12/31/2019   Cervical radiculitis 09/14/2019   Arrhythmia, sinus node 05/29/2019   Fatigue 06/07/2018   Insomnia due to anxiety and fear 04/20/2018   Chronic bladder pain 12/23/2017   Irritable bowel syndrome (IBS) 12/23/2017   Atrophic vaginitis 02/11/2017   CKD (chronic kidney disease) stage 3, GFR 30-59  ml/min ([place]) 08/24/2016   Dysphagia, pharyngoesophageal phase 06/20/2016   Sciatica, left side 06/20/2016   Controlled type 2 diabetes with neuropathy ([place]) 05/16/2016   Preventative health care 02/06/2016   Cervicalgia 01/09/2015   Cystocele 08/29/2014   Anemia 05/02/2014   TIA (transient ischemic attack) 09/11/2013   Overweight (BMI 25.0-29.9) 03/16/2013   Bilateral carotid artery stenosis 03/03/2013   Ulnar neuropathy of left upper extremity 03/03/2013   Chronic reflux esophagitis 12/29/2012   Right hip pain 12/29/2012   History of tobacco abuse 07/03/2012   AAA (abdominal aortic aneurysm) without rupture (HCC)    CAD (coronary artery disease), autologous vein bypass graft    Peripheral vascular disease ([place])    Arthritis 02/29/2012   Hyperlipidemia    Osteopenia 08/29/2011   Vitamin D deficiency 08/29/2011    Conditions to be addressed/monitored: CAD, DMII, and Anxiety.  Limited Social Support, Mental Health Concerns, Social Isolation, Limited Access to Caregiver, and Lacks Knowledge of [employer].  Care Plan : LCSW Plan of Care  Updates made by Francis Gaines, LCSW since 10/10/2021 12:00 AM     Problem: Reduce and Manage My Symptoms of Anxiety and Stress.   Priority: High     Goal: Reduce and Manage My Symptoms of Anxiety and Stress.   Start Date: 07/21/2021  Expected End Date: 11/21/2021  This Visit's Progress: On track  Recent Progress: On track  Priority: High  Note:   Current Barriers:   Acute Mental Health needs related to Coronary Artery Disease Involving Native Coronary Artery of Native Heart, Stage II Chronic Kidney Disease, Type 2 Diabetes Mellitus with Chronic Kidney Disease, Peripheral Vascular Disease, Pure Hypercholesterolemia, Anxiety and Stress of Chronic Illness requires Support, Education, Resources, Referrals and Care Coordination in order to meet unmet mental health needs. Clinical Goal(s):  Patient will work with LCSW to reduce and manage  symptoms of Anxiety and Stress, until manageable.    Patient will increase knowledge and/or ability of:  Coping Skills, Self-Management Skills and Stress Reduction.     Clinical Interventions:  Emotional Support Provided, Cognitive Behavioral Therapy Initiated, and Verbalization of Feelings Encouraged.   Collaboration with Primary Care Physician, Dr. Deborra Medina regarding development and update of comprehensive plan of care as evidenced by provider attestation and co-signature. Patient Goals/Self-Care Activities: Continue to receive personal counseling with LCSW, on bi-weekly basis, to reduce and manage symptoms of Anxiety and Stress, until manageable.     Contact LCSW directly (# [id number]) if you have questions, need assistance, or if additional social work needs are identified in the near future.   Congratulations on compliance with attending all scheduled Cardiac Rehabilitation Appointments, with the [facility], thus far, which takes place every Tuesday and Thursday until 01/31/2022. Follow-Up:   No follow-required, per patient request.  Patient agreed to contact LCSW directly when she is ready to resume personal counseling services, "wanting to complete outpatient rehabilitation appointments and get through the holidays".      Nat Christen LCSW Licensed Clinical Social Worker [place]  [phone]

## 2021-10-11 ENCOUNTER — Other Ambulatory Visit: Payer: Self-pay

## 2021-10-11 ENCOUNTER — Telehealth: Payer: Medicare Other

## 2021-10-11 ENCOUNTER — Encounter: Payer: Medicare Other | Admitting: *Deleted

## 2021-10-11 DIAGNOSIS — Z5189 Encounter for other specified aftercare: Secondary | ICD-10-CM | POA: Diagnosis not present

## 2021-10-11 DIAGNOSIS — I252 Old myocardial infarction: Secondary | ICD-10-CM | POA: Diagnosis not present

## 2021-10-11 DIAGNOSIS — I214 Non-ST elevation (NSTEMI) myocardial infarction: Secondary | ICD-10-CM

## 2021-10-11 NOTE — Progress Notes (Signed)
Daily Session Note  Patient Details  Name: Sabrina Montoya MRN: [id number] Date of Birth: 1941/04/05 Referring Provider:   Flowsheet Row Cardiac Rehab from 10/04/2021 in [facility] Cardiac and Pulmonary Rehab  Referring Provider Harle Stanford MD       Encounter Date: 10/11/2021  Check In:  Session Check In - 10/11/21 1217       Check-In   Supervising physician immediately available to respond to emergencies See telemetry face sheet for immediately available ER MD    Location [facility]-Cardiac & Pulmonary Rehab    Staff Present Nyoka Cowden, RN, BSN, Ardeth Sportsman, RDN, Rowe Pavy, BA, ACSM CEP, Exercise Physiologist    Virtual Visit No    Medication changes reported     No    Fall or balance concerns reported    No    Tobacco Cessation No Change    Warm-up and Cool-down Performed on first and last piece of equipment    Resistance Training Performed Yes    VAD Patient? No    PAD/SET Patient? No      Pain Assessment   Currently in Pain? No/denies                Social History   Tobacco Use  Smoking Status Former   Packs/day: 0.50   Years: 30.00   Pack years: 15.00   Types: Cigarettes   Quit date: 04/01/2012   Years since quitting: 9.5  Smokeless Tobacco Never  Tobacco Comments   quit for 2 years after sinus infection and 6 months after heart surgery    Goals Met:  Improved SOB with ADL's Exercise tolerated well No report of concerns or symptoms today  Goals Unmet:  Not Applicable  Comments: Pt able to follow exercise prescription today without complaint.  Will continue to monitor for progression.    Dr. Emily Filbert is Medical Director for [place].  Dr. Ottie Glazier is Medical Director for [facility] Pulmonary Rehabilitation.

## 2021-10-13 ENCOUNTER — Other Ambulatory Visit: Payer: Self-pay

## 2021-10-13 DIAGNOSIS — Z5189 Encounter for other specified aftercare: Secondary | ICD-10-CM | POA: Diagnosis not present

## 2021-10-13 DIAGNOSIS — I252 Old myocardial infarction: Secondary | ICD-10-CM | POA: Diagnosis not present

## 2021-10-13 DIAGNOSIS — I214 Non-ST elevation (NSTEMI) myocardial infarction: Secondary | ICD-10-CM

## 2021-10-13 NOTE — Progress Notes (Signed)
Daily Session Note  Patient Details  Name: Sabrina Montoya MRN: [id number] Date of Birth: 10/08/1941 Referring Provider:   Flowsheet Row Cardiac Rehab from 10/04/2021 in [facility] Cardiac and Pulmonary Rehab  Referring Provider Harle Stanford MD       Encounter Date: 10/13/2021  Check In:  Session Check In - 10/13/21 1011       Check-In   Supervising physician immediately available to respond to emergencies See telemetry face sheet for immediately available ER MD    Location [facility]-Cardiac & Pulmonary Rehab    Staff Present Birdie Sons, MPA, Elveria Rising, BA, ACSM CEP, Exercise Physiologist;Raul Torrance Amedeo Plenty, BS, ACSM CEP, Exercise Physiologist;Melissa Caiola, RDN, LDN    Virtual Visit No    Medication changes reported     No    Fall or balance concerns reported    No    Tobacco Cessation No Change    Warm-up and Cool-down Performed on first and last piece of equipment    Resistance Training Performed Yes    VAD Patient? No    PAD/SET Patient? No      Pain Assessment   Currently in Pain? No/denies                Social History   Tobacco Use  Smoking Status Former   Packs/day: 0.50   Years: 30.00   Pack years: 15.00   Types: Cigarettes   Quit date: 04/01/2012   Years since quitting: 9.5  Smokeless Tobacco Never  Tobacco Comments   quit for 2 years after sinus infection and 6 months after heart surgery    Goals Met:  Independence with exercise equipment Exercise tolerated well No report of concerns or symptoms today Strength training completed today  Goals Unmet:  Not Applicable  Comments: Pt able to follow exercise prescription today without complaint.  Will continue to monitor for progression.    Dr. Emily Filbert is Medical Director for [place].  Dr. Ottie Glazier is Medical Director for [facility] Pulmonary Rehabilitation.

## 2021-10-17 DIAGNOSIS — Z85828 Personal history of other malignant neoplasm of skin: Secondary | ICD-10-CM | POA: Diagnosis not present

## 2021-10-17 DIAGNOSIS — L304 Erythema intertrigo: Secondary | ICD-10-CM | POA: Diagnosis not present

## 2021-10-17 DIAGNOSIS — L570 Actinic keratosis: Secondary | ICD-10-CM | POA: Diagnosis not present

## 2021-10-17 DIAGNOSIS — L821 Other seborrheic keratosis: Secondary | ICD-10-CM | POA: Diagnosis not present

## 2021-10-17 DIAGNOSIS — L218 Other seborrheic dermatitis: Secondary | ICD-10-CM | POA: Diagnosis not present

## 2021-10-17 DIAGNOSIS — L820 Inflamed seborrheic keratosis: Secondary | ICD-10-CM | POA: Diagnosis not present

## 2021-10-17 DIAGNOSIS — L814 Other melanin hyperpigmentation: Secondary | ICD-10-CM | POA: Diagnosis not present

## 2021-10-18 ENCOUNTER — Other Ambulatory Visit (INDEPENDENT_AMBULATORY_CARE_PROVIDER_SITE_OTHER): Payer: Self-pay | Admitting: Nurse Practitioner

## 2021-10-18 ENCOUNTER — Other Ambulatory Visit (INDEPENDENT_AMBULATORY_CARE_PROVIDER_SITE_OTHER): Payer: Self-pay | Admitting: Vascular Surgery

## 2021-10-18 DIAGNOSIS — I6523 Occlusion and stenosis of bilateral carotid arteries: Secondary | ICD-10-CM

## 2021-10-18 DIAGNOSIS — I739 Peripheral vascular disease, unspecified: Secondary | ICD-10-CM

## 2021-10-19 ENCOUNTER — Other Ambulatory Visit: Payer: Self-pay

## 2021-10-19 ENCOUNTER — Ambulatory Visit (INDEPENDENT_AMBULATORY_CARE_PROVIDER_SITE_OTHER): Payer: Medicare Other

## 2021-10-19 DIAGNOSIS — I714 Abdominal aortic aneurysm, without rupture, unspecified: Secondary | ICD-10-CM

## 2021-10-19 DIAGNOSIS — I6523 Occlusion and stenosis of bilateral carotid arteries: Secondary | ICD-10-CM

## 2021-10-19 DIAGNOSIS — I739 Peripheral vascular disease, unspecified: Secondary | ICD-10-CM

## 2021-10-21 ENCOUNTER — Telehealth: Payer: Medicare Other

## 2021-10-25 ENCOUNTER — Other Ambulatory Visit: Payer: Self-pay

## 2021-10-25 DIAGNOSIS — Z5189 Encounter for other specified aftercare: Secondary | ICD-10-CM | POA: Diagnosis not present

## 2021-10-25 DIAGNOSIS — I252 Old myocardial infarction: Secondary | ICD-10-CM | POA: Diagnosis not present

## 2021-10-25 DIAGNOSIS — I214 Non-ST elevation (NSTEMI) myocardial infarction: Secondary | ICD-10-CM

## 2021-10-25 NOTE — Progress Notes (Signed)
Daily Session Note ° °Patient Details  °Name: Sabrina Montoya °MRN: [id number] °Date of Birth: 04/10/1941 °Referring Provider:   °Flowsheet Row Cardiac Rehab from 10/04/2021 in [facility] Cardiac and Pulmonary Rehab  °Referring Provider Komada, Michael MD  ° °  ° ° °Encounter Date: 10/25/2021 ° °Check In: ° Session Check In - 10/25/21 1110   ° °  ° Check-In  ° Supervising physician immediately available to respond to emergencies See telemetry face sheet for immediately available ER MD   ° Location [facility]-Cardiac & Pulmonary Rehab   ° Staff Present Kelly Bollinger, MPA, RN;Amanda Sommer, BA, ACSM CEP, Exercise Physiologist;Jessica Hawkins, MA, RCEP, CCRP, CCET   ° Virtual Visit No   ° Medication changes reported     No   ° Fall or balance concerns reported    No   ° Tobacco Cessation No Change   ° Warm-up and Cool-down Performed on first and last piece of equipment   ° Resistance Training Performed Yes   ° VAD Patient? No   ° PAD/SET Patient? No   °  ° Pain Assessment  ° Currently in Pain? No/denies   ° °  °  ° °  ° ° ° ° ° °Social History  ° °Tobacco Use  °Smoking Status Former  ° Packs/day: 0.50  ° Years: 30.00  ° Pack years: 15.00  ° Types: Cigarettes  ° Quit date: 04/01/2012  ° Years since quitting: 9.5  °Smokeless Tobacco Never  °Tobacco Comments  ° quit for 2 years after sinus infection and 6 months after heart surgery  ° ° °Goals Met:  °Independence with exercise equipment °Exercise tolerated well °Personal goals reviewed °No report of concerns or symptoms today °Strength training completed today ° °Goals Unmet:  °Not Applicable ° °Comments: Pt able to follow exercise prescription today without complaint.  Will continue to monitor for progression. ° ° ° °Dr. Mark Miller is Medical Director for [facility]iac [facility].  °Dr. Fuad Aleskerov is Medical Director for [facility] Pulmonary Rehabilitation. °

## 2021-10-27 ENCOUNTER — Other Ambulatory Visit: Payer: Self-pay

## 2021-10-27 DIAGNOSIS — I252 Old myocardial infarction: Secondary | ICD-10-CM | POA: Diagnosis not present

## 2021-10-27 DIAGNOSIS — Z5189 Encounter for other specified aftercare: Secondary | ICD-10-CM | POA: Diagnosis not present

## 2021-10-27 DIAGNOSIS — I214 Non-ST elevation (NSTEMI) myocardial infarction: Secondary | ICD-10-CM

## 2021-10-27 NOTE — Progress Notes (Signed)
Daily Session Note  Patient Details  Name: Sabrina Montoya MRN: [id number] Date of Birth: 23-Jul-1941 Referring Provider:   Flowsheet Row Cardiac Rehab from 10/04/2021 in [facility] Cardiac and Pulmonary Rehab  Referring Provider Harle Stanford MD       Encounter Date: 10/27/2021  Check In:  Session Check In - 10/27/21 1037       Check-In   Supervising physician immediately available to respond to emergencies See telemetry face sheet for immediately available ER MD    Location [facility]-Cardiac & Pulmonary Rehab    Staff Present Birdie Sons, MPA, Elveria Rising, BA, ACSM CEP, Exercise Physiologist;Melissa [place], RDN, LDN;Meredith Sherryll Burger, RN BSN    Virtual Visit No    Medication changes reported     No    Fall or balance concerns reported    No    Tobacco Cessation No Change    Warm-up and Cool-down Performed on first and last piece of equipment    Resistance Training Performed Yes    VAD Patient? No    PAD/SET Patient? No      Pain Assessment   Currently in Pain? No/denies                Social History   Tobacco Use  Smoking Status Former   Packs/day: 0.50   Years: 30.00   Pack years: 15.00   Types: Cigarettes   Quit date: 04/01/2012   Years since quitting: 9.5  Smokeless Tobacco Never  Tobacco Comments   quit for 2 years after sinus infection and 6 months after heart surgery    Goals Met:  Independence with exercise equipment Exercise tolerated well No report of concerns or symptoms today Strength training completed today  Goals Unmet:  Not Applicable  Comments: Pt able to follow exercise prescription today without complaint.  Will continue to monitor for progression.    Dr. Emily Filbert is Medical Director for [place].  Dr. Ottie Glazier is Medical Director for [facility] Pulmonary Rehabilitation.

## 2021-11-02 ENCOUNTER — Encounter: Payer: Self-pay | Admitting: *Deleted

## 2021-11-02 DIAGNOSIS — R002 Palpitations: Secondary | ICD-10-CM | POA: Diagnosis not present

## 2021-11-02 DIAGNOSIS — I214 Non-ST elevation (NSTEMI) myocardial infarction: Secondary | ICD-10-CM

## 2021-11-02 DIAGNOSIS — I25119 Atherosclerotic heart disease of native coronary artery with unspecified angina pectoris: Secondary | ICD-10-CM | POA: Diagnosis not present

## 2021-11-02 DIAGNOSIS — I208 Other forms of angina pectoris: Secondary | ICD-10-CM | POA: Diagnosis not present

## 2021-11-02 DIAGNOSIS — R079 Chest pain, unspecified: Secondary | ICD-10-CM | POA: Diagnosis not present

## 2021-11-02 DIAGNOSIS — I10 Essential (primary) hypertension: Secondary | ICD-10-CM | POA: Diagnosis not present

## 2021-11-02 NOTE — Progress Notes (Signed)
Cardiac Individual Treatment Plan  Patient Details  Name: ANAYLA GIANNETTI MRN: [id number] Date of Birth: 1941-06-07 Referring Provider:   Flowsheet Row Cardiac Rehab from 10/04/2021 in [facility] Cardiac and Pulmonary Rehab  Referring Provider Harle Stanford MD       Initial Encounter Date:  Flowsheet Row Cardiac Rehab from 10/04/2021 in [facility] Cardiac and Pulmonary Rehab  Date 10/04/21       Visit Diagnosis: NSTEMI (non-ST elevated myocardial infarction) [facility])  Patient's Home Medications on Admission:  Current Outpatient Medications:    amLODipine (NORVASC) 5 MG tablet, Take by mouth., Disp: , Rfl:    amoxicillin-clavulanate (AUGMENTIN) 875-125 MG tablet, Take 1 tablet by mouth 2 (two) times daily., Disp: 14 tablet, Rfl: 0   aspirin 81 MG tablet, Take 81 mg by mouth daily., Disp: , Rfl:    cholecalciferol (VITAMIN D) 1000 UNITS tablet, Take 1,000 Units by mouth daily., Disp: , Rfl:    clonazePAM (KLONOPIN) 0.5 MG tablet, Take by mouth., Disp: , Rfl:    conjugated estrogens (PREMARIN) vaginal cream, Place vaginally as needed. (Patient not taking: No sig reported), Disp: , Rfl:    cyanocobalamin 1000 MCG tablet, Take by mouth., Disp: , Rfl:    isosorbide mononitrate (IMDUR) 60 MG 24 hr tablet, Take 1 tablet (60 mg total) by mouth daily., Disp: 30 tablet, Rfl: 0   metoprolol succinate (TOPROL-XL) 100 MG 24 hr tablet, Take by mouth., Disp: , Rfl:    Multiple Vitamins-Minerals (PRESERVISION AREDS 2 PO), Take by mouth 2 (two) times daily., Disp: , Rfl:    nitroGLYCERIN (NITROSTAT) 0.4 MG SL tablet, Place 1 tablet (0.4 mg total) under the tongue every 5 (five) minutes as needed for chest pain., Disp: 50 tablet, Rfl: 3   omeprazole (PRILOSEC) 20 MG capsule, TAKE 1 CAPSULE(20 MG) BY MOUTH DAILY, Disp: 90 capsule, Rfl: 1   predniSONE (DELTASONE) 10 MG tablet, 6 tablets on Day 1 , then reduce by 1 tablet daily until gone, Disp: 21 tablet, Rfl: 0   rosuvastatin (CRESTOR) 40 MG tablet, Take 1 tablet (40  mg total) by mouth daily., Disp: 90 tablet, Rfl: 0   Turmeric (QC TUMERIC COMPLEX PO), Take 1 mg by mouth daily., Disp: , Rfl:   Past Medical History: Past Medical History:  Diagnosis Date   Abdominal aortic aneurysm without mention of rupture    infrarenal, stable, folllowed by Leotis Pain   Acute posthemorrhagic anemia    Arthritis    B12 deficiency    CAD (coronary artery disease), autologous vein bypass graft    Cardiac dysrhythmia, unspecified    Colon polyp    Depression    Diverticulitis of colon    Dizziness    DUE TO MEDICINES   Dysrhythmia    GERD (gastroesophageal reflux disease)    HOH (hard of hearing)    Hyperlipidemia    Hypertension    pt denies. placed on meds after CABG   IBS (irritable bowel syndrome)    Neuropathy    Neuropathy 07/01/2013   Peripheral vascular disease ([place])    s/p CEA    Reflux esophagitis    Rheumatic fever    possible at age 3   Sliding hiatal hernia    Tobacco abuse    Tobacco abuse     Tobacco Use: Social History   Tobacco Use  Smoking Status Former   Packs/day: 0.50   Years: 30.00   Pack years: 15.00   Types: Cigarettes   Quit date: 04/01/2012   Years  since quitting: 9.5  Smokeless Tobacco Never  Tobacco Comments   quit for 2 years after sinus infection and 6 months after heart surgery    Labs: Recent Review Flowsheet Data     Labs for ITP Cardiac and Pulmonary Rehab Latest Ref Rng & Units 10/23/2018 05/20/2019 05/13/2020 04/27/2021 06/06/2021   Cholestrol 0 - 200 mg/dL 106 119 119 141 116   LDLCALC 0 - 99 mg/dL 32 57 44 63 48   LDLDIRECT mg/dL - - - - -   HDL >40 mg/dL 40.10 40.80 38.20(L) 44.60 44   Trlycerides <150 mg/dL 168.0(H) 106.0 186.0(H) 169.0(H) 122   Hemoglobin A1c 4.8 - 5.6 % 6.8(H) 6.7(H) 6.6(H) 7.3(H) 7.0(H)        Exercise Target Goals: Exercise Program Goal: Individual exercise prescription set using results from initial 6 min walk test and THRR while considering  patients activity barriers and  safety.   Exercise Prescription Goal: Initial exercise prescription builds to 30-45 minutes a day of aerobic activity, 2-3 days per week.  Home exercise guidelines will be given to patient during program as part of exercise prescription that the participant will acknowledge.   Education: Aerobic Exercise: - Group verbal and visual presentation on the components of exercise prescription. Introduces F.I.T.T principle from ACSM for exercise prescriptions.  Reviews F.I.T.T. principles of aerobic exercise including progression. Written material given at graduation. Flowsheet Row Cardiac Rehab from 10/27/2021 in [facility] Cardiac [facility] Pul[facility] Rehab  Education need identified 10/04/21       Education: Resistance Exercise: - Group verbal and visual presentation on the components of exercise prescription. Introduces F.I.T.T principle from ACSM for exercise prescriptions  Reviews F.I.T.T. principles of resistance exercise including progression. Written material given at graduation.    Education: Exercise & Equipment Safety: - Individual verbal instruction and demonstration of equipment use and safety with use of the equipment. Flowsheet Row Cardiac Rehab from 10/27/2021 in [facility] Cardiac and Pulmonary Rehab  Date 08/18/21  Educator [facility]  Instruction Review Code 1- Verbalizes Understanding       Education: Exercise Physiology & General Exercise Guidelines: - Group verbal and written instruction with models to review the exercise physiology of the cardiovascular system and associated critical values. Provides general exercise guidelines with specific guidelines to those with heart or lung disease.  Flowsheet Row Cardiac Rehab from 10/27/2021 in [facility] Cardiac and Pulmonary Rehab  Date 10/27/21  Educator [facility]  Instruction Review Code 1- Verbalizes Understanding       Education: Flexibility, Balance, Mind/Body Relaxation: - Group verbal and visual presentation with interactive activity on the components  of exercise prescription. Introduces F.I.T.T principle from ACSM for exercise prescriptions. Reviews F.I.T.T. principles of flexibility and balance exercise training including progression. Also discusses the mind body connection.  Reviews various relaxation techniques to help reduce and manage stress (i.e. Deep breathing, progressive muscle relaxation, and visualization). Balance handout provided to take home. Written material given at graduation.   Activity Barriers & Risk Stratification:  Activity Barriers & Cardiac Risk Stratification - 10/04/21 1448       Activity Barriers & Cardiac Risk Stratification   Activity Barriers Back Problems;Neck/Spine Problems;Arthritis;Deconditioning;Muscular Weakness;Other (comment)    Comments History of rotator cuff on right    Cardiac Risk Stratification High             6 Minute Walk:  6 Minute Walk     Row Name 10/04/21 1452         6 Minute Walk   Phase Initial  Distance 800 feet     Walk Time 5.5 minutes  Break 2:38-3:04     # of Rest Breaks 1     MPH 1.65     METS 1.32     RPE 12     Perceived Dyspnea  3     VO2 Peak 4.62     Symptoms Yes (comment)     Comments SOB     Resting HR 57 bpm     Resting BP 110/66     Resting Oxygen Saturation  97 %     Exercise Oxygen Saturation  during 6 min walk 100 %     Max Ex. HR 76 bpm     Max Ex. BP 132/66     2 Minute Post BP 112/62              Oxygen Initial Assessment:   Oxygen Re-Evaluation:   Oxygen Discharge (Final Oxygen Re-Evaluation):   Initial Exercise Prescription:  Initial Exercise Prescription - 10/04/21 1500       Date of Initial Exercise RX and Referring Provider   Date 10/04/21    Referring Provider Harle Stanford MD      Recumbant Bike   Level 1    RPM 60    Minutes 15    METs 1.3      NuStep   Level 1    SPM 80    Minutes 15    METs 1.3      T5 Nustep   Level 1    SPM 80    Minutes 15    METs 1.3      Track   Laps 13    Minutes  15    METs 1.71      Prescription Details   Frequency (times per week) 2    Duration Progress to 30 minutes of continuous aerobic without signs/symptoms of physical distress      Intensity   THRR 40-80% of Max Heartrate 90-124    Ratings of Perceived Exertion 11-13    Perceived Dyspnea 0-4      Progression   Progression Continue progressive overload as per policy without signs/symptoms or physical distress.      Resistance Training   Training Prescription Yes    Weight 3 lb    Reps 10-15             Perform Capillary Blood Glucose checks as needed.  Exercise Prescription Changes:   Exercise Prescription Changes     Row Name 10/04/21 1500 10/17/21 1300 10/25/21 1100         Response to Exercise   Blood Pressure (Admit) 110/66 128/64 --     Blood Pressure (Exercise) 132/66 132/64 --     Blood Pressure (Exit) 112/62 122/60 --     Heart Rate (Admit) 57 bpm 78 bpm --     Heart Rate (Exercise) 76 bpm 89 bpm --     Heart Rate (Exit) 55 bpm 68 bpm --     Oxygen Saturation (Admit) 97 % -- --     Oxygen Saturation (Exercise) 100 % -- --     Oxygen Saturation (Exit) 100 % -- --     Rating of Perceived Exertion (Exercise) 12 12 --     Perceived Dyspnea (Exercise) 3 -- --     Symptoms SOB -- --     Comments walk test results fourth exercise day --     Duration -- Progress to 30 minutes of  aerobic without signs/symptoms of physical distress --     Intensity -- THRR unchanged --       Progression   Progression -- Continue to progress workloads to maintain intensity without signs/symptoms of physical distress. --     Average METs -- 2.65 --       Resistance Training   Training Prescription -- Yes --     Weight -- 3 lb --     Reps -- 10-15 --       Recumbant Bike   Level -- 1 --     Minutes -- 15 --     METs -- 2.9 --       NuStep   Level -- 1 --     Minutes -- 15 --     METs -- 2.4 --       Home Exercise Plan   Plans to continue exercise at -- -- Home  (comment)  walking, Youtube videos     Frequency -- -- Add 3 additional days to program exercise sessions.  start with 1     Initial Home Exercises Provided -- -- 10/25/21              Exercise Comments:   Exercise Comments     Row Name 10/06/21 1038           Exercise Comments First full day of exercise!  Patient was oriented to gym and equipment including functions, settings, policies, and procedures.  Patient's individual exercise prescription and treatment plan were reviewed.  All starting workloads were established based on the results of the 6 minute walk test done at initial orientation visit.  The plan for exercise progression was also introduced and progression will be customized based on patient's performance and goals.                Exercise Goals and Review:   Exercise Goals     Row Name 10/04/21 1531             Exercise Goals   Increase Physical Activity Yes       Intervention Provide advice, education, support and counseling about physical activity/exercise needs.;Develop an individualized exercise prescription for aerobic and resistive training based on initial evaluation findings, risk stratification, comorbidities and participant's personal goals.       Expected Outcomes Short Term: Attend rehab on a regular basis to increase amount of physical activity.;Long Term: Add in home exercise to make exercise part of routine and to increase amount of physical activity.;Long Term: Exercising regularly at least 3-5 days a week.       Increase Strength and Stamina Yes       Intervention Provide advice, education, support and counseling about physical activity/exercise needs.;Develop an individualized exercise prescription for aerobic and resistive training based on initial evaluation findings, risk stratification, comorbidities and participant's personal goals.       Expected Outcomes Short Term: Increase workloads from initial exercise prescription for resistance,  speed, and METs.;Short Term: Perform resistance training exercises routinely during rehab and add in resistance training at home;Long Term: Improve cardiorespiratory fitness, muscular endurance and strength as measured by increased METs and functional capacity (6MWT)       Able to understand and use rate of perceived exertion (RPE) scale Yes       Intervention Provide education and explanation on how to use RPE scale       Expected Outcomes Short Term: Able to use RPE daily in rehab to express subjective  intensity level;Long Term:  Able to use RPE to guide intensity level when exercising independently       Able to understand and use Dyspnea scale Yes       Intervention Provide education and explanation on how to use Dyspnea scale       Expected Outcomes Short Term: Able to use Dyspnea scale daily in rehab to express subjective sense of shortness of breath during exertion;Long Term: Able to use Dyspnea scale to guide intensity level when exercising independently       Knowledge and understanding of Target Heart Rate Range (THRR) Yes       Intervention Provide education and explanation of THRR including how the numbers were predicted and where they are located for reference       Expected Outcomes Short Term: Able to state/look up THRR;Long Term: Able to use THRR to govern intensity when exercising independently;Short Term: Able to use daily as guideline for intensity in rehab       Able to check pulse independently Yes       Intervention Provide education and demonstration on how to check pulse in carotid and radial arteries.;Review the importance of being able to check your own pulse for safety during independent exercise       Expected Outcomes Short Term: Able to explain why pulse checking is important during independent exercise;Long Term: Able to check pulse independently and accurately       Understanding of Exercise Prescription Yes       Intervention Provide education, explanation, and written  materials on patient's individual exercise prescription       Expected Outcomes Short Term: Able to explain program exercise prescription;Long Term: Able to explain home exercise prescription to exercise independently                Exercise Goals Re-Evaluation :  Exercise Goals Re-Evaluation     [place] Name 10/06/21 1038 10/17/21 1340 10/25/21 1133         Exercise Goal Re-Evaluation   Exercise Goals Review Increase Physical Activity;Able to understand and use rate of perceived exertion (RPE) scale;Knowledge and understanding of Target Heart Rate Range (THRR);Understanding of Exercise Prescription;Increase Strength and Stamina;Able to understand and use Dyspnea scale;Able to check pulse independently Increase Physical Activity;Increase Strength and Stamina Increase Physical Activity;Increase Strength and Stamina     Comments Reviewed RPE and dyspnea scales, THR and program prescription with pt today.  Pt voiced understanding and was given a copy of goals to take home. Cher has done well in her first few sessions.  Staff will monitor progress. Reviewed home exercise with pt today.  Pt plans to walk and do Youtube staff videos for exercise.  Reviewed THR, pulse, RPE, sign and symptoms, pulse oximetery and when to call 911 or MD.  Also discussed weather considerations and indoor options.  Pt voiced understanding.     Expected Outcomes Short: Use RPE daily to regulate intensity. Long: Follow program prescription in THR. Short:  attend consistently  Long:  improve overall stamina Short: Add on 1 day of exercise at home and check HR Long: Exercise independently at home at appropriate prescription              Discharge Exercise Prescription (Final Exercise Prescription Changes):  Exercise Prescription Changes - 10/25/21 1100       Home Exercise Plan   Plans to continue exercise at Home (comment)   walking, Youtube videos   Frequency Add 3 additional days to program exercise  sessions.   start  with 1   Initial Home Exercises Provided 10/25/21             Nutrition:  Target Goals: Understanding of nutrition guidelines, daily intake of sodium 1500mg , cholesterol 200mg , calories 30% from fat and 7% or less from saturated fats, daily to have 5 or more servings of fruits and vegetables.  Education: All About Nutrition: -Group instruction provided by verbal, written material, interactive activities, discussions, models, and posters to present general guidelines for heart healthy nutrition including fat, fiber, MyPlate, the role of sodium in heart healthy nutrition, utilization of the nutrition label, and utilization of this knowledge for meal planning. Follow up email sent as well. Written material given at graduation. Flowsheet Row Cardiac Rehab from 10/27/2021 in [facility] Cardiac and Pulmonary Rehab  Education need identified 10/04/21       Biometrics:  Pre Biometrics - 10/04/21 1450       Pre Biometrics   Height 5\' 4"  (1.626 m)    Weight 152 lb 9.6 oz (69.2 kg)    BMI (Calculated) 26.18    Single Leg Stand 12.4 seconds              Nutrition Therapy Plan and Nutrition Goals:  Nutrition Therapy & Goals - 10/25/21 1139       Personal Nutrition Goals   Comments She feels that her sweet tooth is an issue for her. She would like to lose 10-15 pounds; discussed weight expectations, changes in body composition as a part of aging, and importance of maintaing muscle tissue, and healthy lifestyle factors like eating health promoting foods and exercising regularly. She would like to do a meal service diet program - unsure the name- she feels that she would spend the same amount that she does on groceries and that she can lose weight. Discussed that meeting calorie and protein needs are important and these meal service diets typically focus on low calories versus nutritional value of foods. If she remembers the name of the program we can review it together. She would like to meet  with RD in person after the new year; RD does not have appointment schedule for all of January yet - will connect with Raylea again next week to schedule.             Nutrition Assessments:  MEDIFICTS Score Key: ?70 Need to make dietary changes  40-70 Heart Healthy Diet ? 40 Therapeutic Level Cholesterol Diet  Flowsheet Row Cardiac Rehab from 10/04/2021 in [facility] Cardiac and Pulmonary Rehab  Picture Your Plate Total Score on Admission 66      Picture Your Plate Scores: <60 Unhealthy dietary pattern with much room for improvement. 41-50 Dietary pattern unlikely to meet recommendations for good health and room for improvement. 51-60 More healthful dietary pattern, with some room for improvement.  >60 Healthy dietary pattern, although there may be some specific behaviors that could be improved.    Nutrition Goals Re-Evaluation:  Nutrition Goals Re-Evaluation     [place] Name 10/25/21 1155             Goals   Comment Brittley would like to meet with RD in person after the new year; RD does not have appointment schedule for all of January yet - will connect with Ellisyn again next week to schedule.                Nutrition Goals Discharge (Final Nutrition Goals Re-Evaluation):  Nutrition Goals Re-Evaluation - 10/25/21 1155  Goals   Comment Saysha would like to meet with RD in person after the new year; RD does not have appointment schedule for all of January yet - will connect with Marsia again next week to schedule.             Psychosocial: Target Goals: Acknowledge presence or absence of significant depression and/or stress, maximize coping skills, provide positive support system. Participant is able to verbalize types and ability to use techniques and skills needed for reducing stress and depression.   Education: Stress, Anxiety, and Depression - Group verbal and visual presentation to define topics covered.  Reviews how body is impacted by stress, anxiety, and depression.   Also discusses healthy ways to reduce stress and to treat/manage anxiety and depression.  Written material given at graduation.   Education: Sleep Hygiene -Provides group verbal and written instruction about how sleep can affect your health.  Define sleep hygiene, discuss sleep cycles and impact of sleep habits. Review good sleep hygiene tips.    Initial Review & Psychosocial Screening:  Initial Psych Review & Screening - 08/18/21 1042       Initial Review   Current issues with Current Anxiety/Panic      Family Dynamics   Good Support System? Yes    Comments She can look to her two daughters for support. Her family support is up and down. If she needs something they will be there.      Barriers   Psychosocial barriers to participate in program The patient should benefit from training in stress management and relaxation.      Screening Interventions   Interventions Encouraged to exercise;Provide feedback about the scores to participant;To provide support and resources with identified psychosocial needs    Expected Outcomes Short Term goal: Utilizing psychosocial counselor, staff and physician to assist with identification of specific Stressors or current issues interfering with healing process. Setting desired goal for each stressor or current issue identified.;Long Term Goal: Stressors or current issues are controlled or eliminated.;Short Term goal: Identification and review with participant of any Quality of Life or Depression concerns found by scoring the questionnaire.;Long Term goal: The participant improves quality of Life and PHQ9 Scores as seen by post scores and/or verbalization of changes             Quality of Life Scores:   Quality of Life - 10/04/21 1519       Quality of Life   Select Quality of Life      Quality of Life Scores   Health/Function Pre 17.57 %    Socioeconomic Pre 19.33 %    Psych/Spiritual Pre 22.93 %    Family Pre 22.5 %    GLOBAL Pre 19.76 %             Scores of 19 and below usually indicate a poorer quality of life in these areas.  A difference of  2-3 points is a clinically meaningful difference.  A difference of 2-3 points in the total score of the Quality of Life Index has been associated with significant improvement in overall quality of life, self-image, physical symptoms, and general health in studies assessing change in quality of life.  PHQ-9: Recent Review Flowsheet Data     Depression screen [facility] 2/9 10/04/2021 09/07/2021 08/18/2021 07/22/2021 06/30/2021   Decreased Interest 1 0 0 0 0   Down, Depressed, Hopeless 0 0 0 1 0   PHQ - 2 Score 1 0 0 1 0   Altered sleeping  1 - - - -   Tired, decreased energy 1 - - - -   Change in appetite 1 - - - -   Feeling bad or failure about yourself  0 - - - -   Trouble concentrating 1 - - - -   Moving slowly or fidgety/restless 0 - - - -   Suicidal thoughts 0 - - - -   PHQ-9 Score 5 - - - -   Difficult doing work/chores Not difficult at all - - - -      Interpretation of Total Score  Total Score Depression Severity:  1-4 = Minimal depression, 5-9 = Mild depression, 10-14 = Moderate depression, 15-19 = Moderately severe depression, 20-27 = Severe depression   Psychosocial Evaluation and Intervention:  Psychosocial Evaluation - 08/18/21 1044       Psychosocial Evaluation & Interventions   Interventions Encouraged to exercise with the program and follow exercise prescription;Stress management education;Relaxation education    Comments She can look to her two daughters for support. Her family support is up and down. If she needs something they will be there.    Expected Outcomes Short: Start HeartTrack to help with mood. Long: Maintain a healthy mental state    Continue Psychosocial Services  Follow up required by staff             Psychosocial Re-Evaluation:  Psychosocial Re-Evaluation     [place] Name 10/25/21 1143             Psychosocial Re-Evaluation   Current  issues with None Identified       Comments Carolene reports trying to make it day to day with no major reported stressors. She reports not being on medication for her mental health. 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 She goes in her sunroom with her cat to catch up on her soap operas and watch christmas movies. She enjoyed decorating for CHristmas and staying busy. She reports not sleeping when her back hurts (she takes naps), she reports "basically anytime I am prone, I sleep".       Expected Outcomes ST: continue to practice stress reducing activities, try large print books to read LT: maintain positive attitude.       Interventions Encouraged to attend Cardiac Rehabilitation for the exercise       Continue Psychosocial Services  Follow up required by staff                Psychosocial Discharge (Final Psychosocial Re-Evaluation):  Psychosocial Re-Evaluation - 10/25/21 1143       Psychosocial Re-Evaluation   Current issues with None Identified    Comments Latashia reports trying to make it day to day with no major reported stressors. She reports not being on medication for her mental health. 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 She goes in her sunroom with her cat to catch up on her soap operas and watch christmas movies. She enjoyed decorating for CHristmas and staying busy. She reports not sleeping when her back hurts (she takes naps), she reports "basically anytime I am prone, I sleep".    Expected Outcomes ST: continue to practice stress reducing activities, try large print books to read LT: maintain positive attitude.    Interventions Encouraged to attend Cardiac Rehabilitation for the exercise    Continue Psychosocial Services  Follow up required  by staff             Vocational Rehabilitation: Provide vocational rehab assistance to  qualifying candidates.   Vocational Rehab Evaluation & Intervention:   Education: Education Goals: Education classes will be provided on a variety of topics geared toward better understanding of heart health and risk factor modification. Participant will state understanding/return demonstration of topics presented as noted by education test scores.  Learning Barriers/Preferences:  Learning Barriers/Preferences - 08/18/21 1041       Learning Barriers/Preferences   Learning Barriers None    Learning Preferences None             General Cardiac Education Topics:  AED/CPR: - Group verbal and written instruction with the use of models to demonstrate the basic use of the AED with the basic ABC's of resuscitation.   Anatomy and Cardiac Procedures: - Group verbal and visual presentation and models provide information about basic cardiac anatomy and function. Reviews the testing methods done to diagnose heart disease and the outcomes of the test results. Describes the treatment choices: Medical Management, Angioplasty, or Coronary Bypass Surgery for treating various heart conditions including Myocardial Infarction, Angina, Valve Disease, and Cardiac Arrhythmias.  Written material given at graduation.   Medication Safety: - Group verbal and visual instruction to review commonly prescribed medications for heart and lung disease. Reviews the medication, class of the drug, and side effects. Includes the steps to properly store meds and maintain the prescription regimen.  Written material given at graduation.   Intimacy: - Group verbal instruction through game format to discuss how heart and lung disease can affect sexual intimacy. Written material given at graduation..   Know Your Numbers and Heart Failure: - Group verbal and visual instruction to discuss disease risk factors for cardiac and pulmonary disease and treatment options.  Reviews associated critical values for  Overweight/Obesity, Hypertension, Cholesterol, and Diabetes.  Discusses basics of heart failure: signs/symptoms and treatments.  Introduces Heart Failure Zone chart for action plan for heart failure.  Written material given at graduation. Flowsheet Row Cardiac Rehab from 10/27/2021 in [facility] Cardiac and Pulmonary Rehab  Date 10/06/21  Educator SB  Instruction Review Code 1- Verbalizes Understanding       Infection Prevention: - Provides verbal and written material to individual with discussion of infection control including proper hand washing and proper equipment cleaning during exercise session. Flowsheet Row Cardiac Rehab from 10/27/2021 in [facility] Cardiac and Pulmonary Rehab  Date 08/18/21  Educator [facility]  Instruction Review Code 1- Verbalizes Understanding       Falls Prevention: - Provides verbal and written material to individual with discussion of falls prevention and safety. Flowsheet Row Cardiac Rehab from 10/27/2021 in [facility] Cardiac and Pulmonary Rehab  Date 08/18/21  Educator [facility]  Instruction Review Code 1- Verbalizes Understanding       Other: -Provides group and verbal instruction on various topics (see comments)   Knowledge Questionnaire Score:  Knowledge Questionnaire Score - 10/04/21 1454       Knowledge Questionnaire Score   Pre Score 22/26: Angina, Aerobic exercise, Nutrition             Core Components/Risk Factors/Patient Goals at Admission:  Personal Goals and Risk Factors at Admission - 10/04/21 1531       Core Components/Risk Factors/Patient Goals on Admission    Weight Management Yes;Weight Loss    Intervention Weight Management: Develop a combined nutrition and exercise program designed to reach desired caloric intake, while maintaining appropriate intake of  nutrient and fiber, sodium and fats, and appropriate energy expenditure required for the weight goal.;Weight Management: Provide education and appropriate resources to help participant work on  and attain dietary goals.;Weight Management/Obesity: Establish reasonable short term and long term weight goals.    Admit Weight 152 lb (68.9 kg)    Goal Weight: Short Term 149 lb (67.6 kg)    Goal Weight: Long Term 145 lb (65.8 kg)    Expected Outcomes Short Term: Continue to assess and modify interventions until short term weight is achieved;Long Term: Adherence to nutrition and physical activity/exercise program aimed toward attainment of established weight goal;Weight Loss: Understanding of general recommendations for a balanced deficit meal plan, which promotes 1-2 lb weight loss per week and includes a negative energy balance of [phone] kcal/d;Understanding recommendations for meals to include 15-35% energy as protein, 25-35% energy from fat, 35-60% energy from carbohydrates, less than 200mg  of dietary cholesterol, 20-35 gm of total fiber daily;Understanding of distribution of calorie intake throughout the day with the consumption of 4-5 meals/snacks    Diabetes Yes    Intervention Provide education about signs/symptoms and action to take for hypo/hyperglycemia.;Provide education about proper nutrition, including hydration, and aerobic/resistive exercise prescription along with prescribed medications to achieve blood glucose in normal ranges: Fasting glucose 65-99 mg/dL    Expected Outcomes Short Term: Participant verbalizes understanding of the signs/symptoms and immediate care of hyper/hypoglycemia, proper foot care and importance of medication, aerobic/resistive exercise and nutrition plan for blood glucose control.;Long Term: Attainment of HbA1C < 7%.    Hypertension Yes    Intervention Provide education on lifestyle modifcations including regular physical activity/exercise, weight management, moderate sodium restriction and increased consumption of fresh fruit, vegetables, and low fat dairy, alcohol moderation, and smoking cessation.;Monitor prescription use compliance.    Expected Outcomes  Short Term: Continued assessment and intervention until BP is < 140/6mm HG in hypertensive participants. < 130/86mm HG in hypertensive participants with diabetes, heart failure or chronic kidney disease.;Long Term: Maintenance of blood pressure at goal levels.    Lipids Yes    Intervention Provide education and support for participant on nutrition & aerobic/resistive exercise along with prescribed medications to achieve LDL 70mg , HDL >40mg .    Expected Outcomes Short Term: Participant states understanding of desired cholesterol values and is compliant with medications prescribed. Participant is following exercise prescription and nutrition guidelines.;Long Term: Cholesterol controlled with medications as prescribed, with individualized exercise RX and with personalized nutrition plan. Value goals: LDL < 70mg , HDL > 40 mg.             Education:Diabetes - Individual verbal and written instruction to review signs/symptoms of diabetes, desired ranges of glucose level fasting, after meals and with exercise. Acknowledge that pre and post exercise glucose checks will be done for 3 sessions at entry of program. [place] from 10/27/2021 in [facility] Cardiac and Pulmonary Rehab  Date 08/18/21  Educator [facility]  Instruction Review Code 1- Verbalizes Understanding       Core Components/Risk Factors/Patient Goals Review:   Goals and Risk Factor Review     Row Name 10/25/21 1127             Core Components/Risk Factors/Patient Goals Review   Personal Goals Review Lipids;Diabetes       Review She has been having some consistent chest pain and her arm has been aching - she switched doctors, but is not sure she made the right decision, she has not spoken to him about the pain she  is having; encouraged her to talk with her doctor. She reports that the only things she can do are the basics because she is so tired and weak. She has recently made two pots of soup so she has easy meals on her  bad days. SHe does not normally check her BG and manages her T2DM with diet - she is not sure what her A1C is. She had recently had COVID for a month - she reports tht she had a rough time recovering. She reports having back pain and has to stop and rest sometimes when she is active.       Expected Outcomes ST: speak with MD about chest pain LT: monitor risk factors                Core Components/Risk Factors/Patient Goals at Discharge (Final Review):   Goals and Risk Factor Review - 10/25/21 1127       Core Components/Risk Factors/Patient Goals Review   Personal Goals Review Lipids;Diabetes    Review She has been having some consistent chest pain and her arm has been aching - she switched doctors, but is not sure she made the right decision, she has not spoken to him about the pain she is having; encouraged her to talk with her doctor. She reports that the only things she can do are the basics because she is so tired and weak. She has recently made two pots of soup so she has easy meals on her bad days. SHe does not normally check her BG and manages her T2DM with diet - she is not sure what her A1C is. She had recently had COVID for a month - she reports tht she had a rough time recovering. She reports having back pain and has to stop and rest sometimes when she is active.    Expected Outcomes ST: speak with MD about chest pain LT: monitor risk factors             ITP Comments:  ITP Comments     Row Name 08/18/21 1047 10/04/21 1151 10/05/21 0952 10/06/21 1038 11/02/21 0637   ITP Comments Virtual Visit completed. Patient informed on EP and RD appointment and 6 Minute walk test. Patient also informed of patient health questionnaires on My Chart. Patient Verbalizes understanding. Visit diagnosis can be found in [facility] 06/05/2021. Completed 6MWT and gym orientation. Initial ITP created and sent for review to Dr. Emily Filbert, Medical Director. 30 Day review completed. Medical Director ITP review  done, changes made as directed, and signed approval by Medical Director.    New to program First full day of exercise!  Patient was oriented to gym and equipment including functions, settings, policies, and procedures.  Patient's individual exercise prescription and treatment plan were reviewed.  All starting workloads were established based on the results of the 6 minute walk test done at initial orientation visit.  The plan for exercise progression was also introduced and progression will be customized based on patient's performance and goals. 30 Day review completed. Medical Director ITP review done, changes made as directed, and signed approval by Medical Director.            Comments:

## 2021-11-08 ENCOUNTER — Other Ambulatory Visit: Payer: Self-pay

## 2021-11-08 ENCOUNTER — Encounter: Payer: Medicare Other | Attending: Internal Medicine

## 2021-11-08 DIAGNOSIS — Z87891 Personal history of nicotine dependence: Secondary | ICD-10-CM | POA: Diagnosis not present

## 2021-11-08 DIAGNOSIS — I214 Non-ST elevation (NSTEMI) myocardial infarction: Secondary | ICD-10-CM | POA: Diagnosis not present

## 2021-11-08 NOTE — Progress Notes (Signed)
Daily Session Note  Patient Details  Name: Sabrina Montoya MRN: [id number] Date of Birth: 11/18/1940 Referring Provider:   Flowsheet Row Cardiac Rehab from 10/04/2021 in [facility] Cardiac and Pulmonary Rehab  Referring Provider Harle Stanford MD       Encounter Date: 11/08/2021  Check In:  Session Check In - 11/08/21 1059       Check-In   Supervising physician immediately available to respond to emergencies See telemetry face sheet for immediately available ER MD    Location [facility]-Cardiac & Pulmonary Rehab    Staff Present Birdie Sons, MPA, RN;Melissa Munjor, RDN, LDN;Jessica Ranchester, MA, RCEP, CCRP, CCET;Amanda Sommer, BA, ACSM CEP, Exercise Physiologist    Virtual Visit No    Medication changes reported     No    Fall or balance concerns reported    No    Tobacco Cessation No Change    Warm-up and Cool-down Performed on first and last piece of equipment    Resistance Training Performed Yes    VAD Patient? No    PAD/SET Patient? No      Pain Assessment   Currently in Pain? No/denies                Social History   Tobacco Use  Smoking Status Former   Packs/day: 0.50   Years: 30.00   Pack years: 15.00   Types: Cigarettes   Quit date: 04/01/2012   Years since quitting: 9.6  Smokeless Tobacco Never  Tobacco Comments   quit for 2 years after sinus infection and 6 months after heart surgery    Goals Met:  Independence with exercise equipment Exercise tolerated well No report of concerns or symptoms today Strength training completed today  Goals Unmet:  Not Applicable  Comments: Pt able to follow exercise prescription today without complaint.  Will continue to monitor for progression.    Dr. Emily Filbert is Medical Director for [place].  Dr. Ottie Glazier is Medical Director for [facility] Pulmonary Rehabilitation.

## 2021-11-10 ENCOUNTER — Other Ambulatory Visit: Payer: Self-pay

## 2021-11-10 DIAGNOSIS — I214 Non-ST elevation (NSTEMI) myocardial infarction: Secondary | ICD-10-CM

## 2021-11-10 DIAGNOSIS — Z87891 Personal history of nicotine dependence: Secondary | ICD-10-CM | POA: Diagnosis not present

## 2021-11-10 NOTE — Progress Notes (Signed)
Daily Session Note  Patient Details  Name: Sabrina Montoya MRN: [id number] Date of Birth: May 31, 1941 Referring Provider:   Flowsheet Row Cardiac Rehab from 10/04/2021 in [facility] Cardiac and Pulmonary Rehab  Referring Provider Harle Stanford MD       Encounter Date: 11/10/2021  Check In:  Session Check In - 11/10/21 1039       Check-In   Supervising physician immediately available to respond to emergencies See telemetry face sheet for immediately available ER MD    Location [facility]-Cardiac & Pulmonary Rehab    Staff Present Birdie Sons, MPA, RN;Melissa [place], RDN, LDN;Meredith Sherryll Burger, RN Vickki Hearing, BA, ACSM CEP, Exercise Physiologist    Virtual Visit No    Medication changes reported     No    Fall or balance concerns reported    No    Tobacco Cessation No Change    Warm-up and Cool-down Performed on first and last piece of equipment    Resistance Training Performed Yes    VAD Patient? No    PAD/SET Patient? No      Pain Assessment   Currently in Pain? No/denies                Social History   Tobacco Use  Smoking Status Former   Packs/day: 0.50   Years: 30.00   Pack years: 15.00   Types: Cigarettes   Quit date: 04/01/2012   Years since quitting: 9.6  Smokeless Tobacco Never  Tobacco Comments   quit for 2 years after sinus infection and 6 months after heart surgery    Goals Met:  Independence with exercise equipment Exercise tolerated well No report of concerns or symptoms today Strength training completed today  Goals Unmet:  Not Applicable  Comments: Pt able to follow exercise prescription today without complaint.  Will continue to monitor for progression.    Dr. Emily Filbert is Medical Director for [place].  Dr. Ottie Glazier is Medical Director for [facility] Pulmonary Rehabilitation.

## 2021-11-14 ENCOUNTER — Ambulatory Visit: Payer: Medicare Other | Admitting: Internal Medicine

## 2021-11-15 ENCOUNTER — Other Ambulatory Visit: Payer: Self-pay

## 2021-11-15 DIAGNOSIS — I214 Non-ST elevation (NSTEMI) myocardial infarction: Secondary | ICD-10-CM | POA: Diagnosis not present

## 2021-11-15 DIAGNOSIS — Z87891 Personal history of nicotine dependence: Secondary | ICD-10-CM | POA: Diagnosis not present

## 2021-11-15 NOTE — Progress Notes (Signed)
Daily Session Note  Patient Details  Name: BLIMA JAIMES MRN: [id number] Date of Birth: July 10, 1941 Referring Provider:   Flowsheet Row Cardiac Rehab from 10/04/2021 in [facility] Cardiac and Pulmonary Rehab  Referring Provider Harle Stanford MD       Encounter Date: 11/15/2021  Check In:  Session Check In - 11/15/21 1107       Check-In   Supervising physician immediately available to respond to emergencies See telemetry face sheet for immediately available ER MD    Location [facility]-Cardiac & Pulmonary Rehab    Staff Present Birdie Sons, MPA, RN;Amanda Sommer, BA, ACSM CEP, Exercise Physiologist;Melissa [place]xton, RDN, LDN;Jessica Hawkins, MA, RCEP, CCRP, CCET    Virtual Visit No    Medication changes reported     No    Fall or balance concerns reported    No    Tobacco Cessation No Change    Warm-up and Cool-down Performed on first and last piece of equipment    Resistance Training Performed Yes    VAD Patient? No    PAD/SET Patient? No      Pain Assessment   Currently in Pain? No/denies                Social History   Tobacco Use  Smoking Status Former   Packs/day: 0.50   Years: 30.00   Pack years: 15.00   Types: Cigarettes   Quit date: 04/01/2012   Years since quitting: 9.6  Smokeless Tobacco Never  Tobacco Comments   quit for 2 years after sinus infection and 6 months after heart surgery    Goals Met:  Independence with exercise equipment Exercise tolerated well No report of concerns or symptoms today Strength training completed today  Goals Unmet:  Not Applicable  Comments: Pt able to follow exercise prescription today without complaint.  Will continue to monitor for progression.    Dr. Emily Filbert is Medical Director for [place].  Dr. Ottie Glazier is Medical Director for [facility] Pulmonary Rehabilitation.

## 2021-11-17 ENCOUNTER — Other Ambulatory Visit: Payer: Self-pay

## 2021-11-17 DIAGNOSIS — I214 Non-ST elevation (NSTEMI) myocardial infarction: Secondary | ICD-10-CM

## 2021-11-17 DIAGNOSIS — Z87891 Personal history of nicotine dependence: Secondary | ICD-10-CM | POA: Diagnosis not present

## 2021-11-17 NOTE — Progress Notes (Signed)
Daily Session Note  Patient Details  Name: LYLIA KARN MRN: [id number] Date of Birth: 10-05-1941 Referring Provider:   Flowsheet Row Cardiac Rehab from 10/04/2021 in [facility] Cardiac and Pulmonary Rehab  Referring Provider Harle Stanford MD       Encounter Date: 11/17/2021  Check In:  Session Check In - 11/17/21 1035       Check-In   Supervising physician immediately available to respond to emergencies See telemetry face sheet for immediately available ER MD    Location [facility]-Cardiac & Pulmonary Rehab    Staff Present Birdie Sons, MPA, RN;Melissa [place], RDN, LDN;Jessica Hawkins, MA, RCEP, CCRP, CCET    Virtual Visit No    Medication changes reported     No    Fall or balance concerns reported    No    Tobacco Cessation No Change    Warm-up and Cool-down Performed on first and last piece of equipment    Resistance Training Performed Yes    VAD Patient? No    PAD/SET Patient? No      Pain Assessment   Currently in Pain? No/denies                Social History   Tobacco Use  Smoking Status Former   Packs/day: 0.50   Years: 30.00   Pack years: 15.00   Types: Cigarettes   Quit date: 04/01/2012   Years since quitting: 9.6  Smokeless Tobacco Never  Tobacco Comments   quit for 2 years after sinus infection and 6 months after heart surgery    Goals Met:  Independence with exercise equipment Exercise tolerated well No report of concerns or symptoms today Strength training completed today  Goals Unmet:  Not Applicable  Comments: Pt able to follow exercise prescription today without complaint.  Will continue to monitor for progression.    Dr. Emily Filbert is Medical Director for [place].  Dr. Ottie Glazier is Medical Director for [facility] Pulmonary Rehabilitation.

## 2021-11-29 ENCOUNTER — Other Ambulatory Visit: Payer: Self-pay

## 2021-11-29 DIAGNOSIS — Z87891 Personal history of nicotine dependence: Secondary | ICD-10-CM | POA: Diagnosis not present

## 2021-11-29 DIAGNOSIS — I214 Non-ST elevation (NSTEMI) myocardial infarction: Secondary | ICD-10-CM | POA: Diagnosis not present

## 2021-11-29 NOTE — Progress Notes (Signed)
Daily Session Note  Patient Details  Name: Sabrina Montoya MRN: [id number] Date of Birth: 02/14/1941 Referring Provider:   Flowsheet Row Cardiac Rehab from 10/04/2021 in [facility] Cardiac and Pulmonary Rehab  Referring Provider Harle Stanford MD       Encounter Date: 11/29/2021  Check In:  Session Check In - 11/29/21 1059       Check-In   Supervising physician immediately available to respond to emergencies See telemetry face sheet for immediately available ER MD    Location [facility]-Cardiac & Pulmonary Rehab    Staff Present Birdie Sons, MPA, RN;Melissa [place], RDN, LDN;Jessica Willard, MA, RCEP, CCRP, Marylynn Pearson, MS, ASCM CEP, Exercise Physiologist    Virtual Visit No    Medication changes reported     No    Fall or balance concerns reported    No    Tobacco Cessation No Change    Warm-up and Cool-down Performed on first and last piece of equipment    Resistance Training Performed Yes    VAD Patient? No    PAD/SET Patient? No      Pain Assessment   Currently in Pain? No/denies                Social History   Tobacco Use  Smoking Status Former   Packs/day: 0.50   Years: 30.00   Pack years: 15.00   Types: Cigarettes   Quit date: 04/01/2012   Years since quitting: 9.6  Smokeless Tobacco Never  Tobacco Comments   quit for 2 years after sinus infection and 6 months after heart surgery    Goals Met:  Independence with exercise equipment Exercise tolerated well No report of concerns or symptoms today Strength training completed today  Goals Unmet:  Not Applicable  Comments: Pt able to follow exercise prescription today without complaint.  Will continue to monitor for progression.    Dr. Emily Filbert is Medical Director for [place].  Dr. Ottie Glazier is Medical Director for [facility] Pulmonary Rehabilitation.

## 2021-11-30 ENCOUNTER — Telehealth: Payer: Self-pay | Admitting: Pharmacist

## 2021-11-30 ENCOUNTER — Encounter: Payer: Self-pay | Admitting: *Deleted

## 2021-11-30 ENCOUNTER — Ambulatory Visit (INDEPENDENT_AMBULATORY_CARE_PROVIDER_SITE_OTHER): Payer: Medicare Other | Admitting: *Deleted

## 2021-11-30 DIAGNOSIS — I739 Peripheral vascular disease, unspecified: Secondary | ICD-10-CM

## 2021-11-30 DIAGNOSIS — N1831 Chronic kidney disease, stage 3a: Secondary | ICD-10-CM

## 2021-11-30 DIAGNOSIS — M542 Cervicalgia: Secondary | ICD-10-CM

## 2021-11-30 DIAGNOSIS — E1140 Type 2 diabetes mellitus with diabetic neuropathy, unspecified: Secondary | ICD-10-CM

## 2021-11-30 DIAGNOSIS — I2510 Atherosclerotic heart disease of native coronary artery without angina pectoris: Secondary | ICD-10-CM

## 2021-11-30 DIAGNOSIS — G459 Transient cerebral ischemic attack, unspecified: Secondary | ICD-10-CM

## 2021-11-30 DIAGNOSIS — F5105 Insomnia due to other mental disorder: Secondary | ICD-10-CM

## 2021-11-30 DIAGNOSIS — R1314 Dysphagia, pharyngoesophageal phase: Secondary | ICD-10-CM

## 2021-11-30 DIAGNOSIS — R5383 Other fatigue: Secondary | ICD-10-CM

## 2021-11-30 DIAGNOSIS — I214 Non-ST elevation (NSTEMI) myocardial infarction: Secondary | ICD-10-CM

## 2021-11-30 DIAGNOSIS — I25810 Atherosclerosis of coronary artery bypass graft(s) without angina pectoris: Secondary | ICD-10-CM

## 2021-11-30 NOTE — Chronic Care Management (AMB) (Signed)
Chronic Care Management    Clinical Social Work Note  11/30/2021 Name: Sabrina Montoya MRN: [id number] DOB: July 13, 1941  Sabrina Montoya is a 81 y.o. year old female who is a primary care patient of Derrel Nip, Aris Everts, MD. The CCM team was consulted to assist the patient with chronic disease management and/or care coordination needs related to: Appointment Scheduling Needs, [employer], and Mental Health Counseling and Resources.   Engaged with patient by telephone for follow up visit in response to provider referral for social work chronic care management and care coordination services.   Consent to Services:  The patient was given information about Chronic Care Management services, agreed to services, and gave verbal consent prior to initiation of services.  Please see initial visit note for detailed documentation.   Patient agreed to services and consent obtained.   Assessment: Review of patient past medical history, allergies, medications, and health status, including review of relevant consultants reports was performed today as part of a comprehensive evaluation and provision of chronic care management and care coordination services.     SDOH (Social Determinants of Health) assessments and interventions performed:    Advanced Directives Status: Not addressed in this encounter.  CCM Care Plan  Allergies  Allergen Reactions   Telmisartan Other (See Comments)    Acute renal failure Renal issues   Niacin And Related Hives   Sertraline Hcl Other (See Comments)    Hallucinations,     Sulfa Drugs Cross Reactors Nausea Only   Tetracyclines & Related Hives   Tizanidine Other (See Comments)    Issues with kidney function   Latex Rash    RAST testing was NEGATIVE  for LATEX   Simvastatin Rash    *Antihyperlipidemics*; elevated LFT's.     Outpatient Encounter Medications as of 11/30/2021  Medication Sig   amLODipine (NORVASC) 5 MG tablet Take by mouth.   amoxicillin-clavulanate  (AUGMENTIN) 875-125 MG tablet Take 1 tablet by mouth 2 (two) times daily.   aspirin 81 MG tablet Take 81 mg by mouth daily.   cholecalciferol (VITAMIN D) 1000 UNITS tablet Take 1,000 Units by mouth daily.   clonazePAM (KLONOPIN) 0.5 MG tablet Take by mouth.   conjugated estrogens (PREMARIN) vaginal cream Place vaginally as needed. (Patient not taking: No sig reported)   cyanocobalamin 1000 MCG tablet Take by mouth.   isosorbide mononitrate (IMDUR) 60 MG 24 hr tablet Take 1 tablet (60 mg total) by mouth daily.   metoprolol succinate (TOPROL-XL) 100 MG 24 hr tablet Take by mouth.   Multiple Vitamins-Minerals (PRESERVISION AREDS 2 PO) Take by mouth 2 (two) times daily.   nitroGLYCERIN (NITROSTAT) 0.4 MG SL tablet Place 1 tablet (0.4 mg total) under the tongue every 5 (five) minutes as needed for chest pain.   omeprazole (PRILOSEC) 20 MG capsule TAKE 1 CAPSULE(20 MG) BY MOUTH DAILY   predniSONE (DELTASONE) 10 MG tablet 6 tablets on Day 1 , then reduce by 1 tablet daily until gone   rosuvastatin (CRESTOR) 40 MG tablet Take 1 tablet (40 mg total) by mouth daily.   Turmeric (QC TUMERIC COMPLEX PO) Take 1 mg by mouth daily.   No facility-administered encounter medications on file as of 11/30/2021.    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 05/05/2021   Extensor tenosynovitis of  right wrist 05/05/2021   Pain, dental 03/17/2021   Chronic thumb pain, right 05/15/2020   Syncope 12/31/2019   Cervical radiculitis 09/14/2019   Arrhythmia, sinus node 05/29/2019   Fatigue 06/07/2018   Insomnia due to anxiety and fear 04/20/2018   Chronic bladder pain 12/23/2017   Irritable bowel syndrome (IBS) 12/23/2017   Atrophic vaginitis 02/11/2017   CKD (chronic kidney disease) stage 3, GFR 30-59  ml/min ([place]) 08/24/2016   Dysphagia, pharyngoesophageal phase 06/20/2016   Sciatica, left side 06/20/2016   Controlled type 2 diabetes with neuropathy ([place]) 05/16/2016   Preventative health care 02/06/2016   Cervicalgia 01/09/2015   Cystocele 08/29/2014   Anemia 05/02/2014   TIA (transient ischemic attack) 09/11/2013   Overweight (BMI 25.0-29.9) 03/16/2013   Bilateral carotid artery stenosis 03/03/2013   Ulnar neuropathy of left upper extremity 03/03/2013   Chronic reflux esophagitis 12/29/2012   Right hip pain 12/29/2012   History of tobacco abuse 07/03/2012   AAA (abdominal aortic aneurysm) without rupture (HCC)    CAD (coronary artery disease), autologous vein bypass graft    Peripheral vascular disease ([place])    Arthritis 02/29/2012   Hyperlipidemia    Osteopenia 08/29/2011   Vitamin D deficiency 08/29/2011    Conditions to be addressed/monitored: CAD, DMII, Anxiety, and Depression.  Mental Health Concerns, Limited Access to Caregiver, Memory Deficits, and Lacks Knowledge of [employer].  Care Plan : LCSW Plan of Care  Updates made by Francis Gaines, LCSW since 11/30/2021 12:00 AM     Problem: Reduce and Manage My Symptoms of Anxiety and Stress.   Priority: High     Long-Range Goal: Reduce and Manage My Symptoms of Anxiety and Stress.   Start Date: 07/21/2021  Expected End Date: 01/18/2022  This Visit's Progress: On track  Recent Progress: On track  Priority: High  Note:   Current Barriers:   Acute Mental Health needs related to Coronary Artery Disease Involving Native Coronary Artery of Native Heart, Stage II Chronic Kidney Disease, Type 2 Diabetes Mellitus with Chronic Kidney Disease, Peripheral Vascular Disease, Pure Hypercholesterolemia, Anxiety and Stress of Chronic Illness requires Support, Education, Resources, Referrals and Care Coordination in order to meet unmet mental health needs. Clinical Goal(s):  Patient will work with LCSW to reduce and manage  symptoms of Anxiety and Stress, until manageable.    Patient will increase knowledge and/or ability of:  Coping Skills, Self-Management Skills and Stress Reduction.     Clinical Interventions:  Emotional Support Provided, Cognitive Behavioral Therapy Initiated, and Verbalization of Feelings Encouraged.   Collaboration with Primary Care Physician, Dr. Deborra Medina regarding development and update of comprehensive plan of care as evidenced by provider attestation and co-signature. Collaboration with Primary Care Physician, Dr. Deborra Medina regarding abnormal blood pressure readings, consistently fluctuating between high and low. Collaboration with Primary Care Physician, Dr. Deborra Medina regarding symptoms of extreme weakness and fatigue, despite attending Cardiac Rehabilitation twice a week since early December of 2022.    Collaboration with Primary Care Physician, Dr. Deborra Medina regarding "inability to find appropriate words when conversing with someone".   ~ Patient reported, "I know in my head what I want to say, but unable to speak the words".   Collaboration with Embedded Pharmacist at [facility], Alphonzo Severance to request review and possible modifications to current medication regimen. Patient Goals/Self-Care Activities: Continue to receive personal counseling with LCSW, on bi-weekly basis, to reduce and manage symptoms of Anxiety and Stress, until manageable.  Congratulations on compliance with attending most Cardiac Rehabilitation appointments at the [facility], scheduled for every Tuesday and Thursday until 01/31/2022. Please reschedule follow-up appointment with Primary Care Physician, Dr. Deborra Medina by contacting the office receptionist at [facility] [phone]). Contact LCSW directly if you have questions, need assistance, or if additional social work needs are  identified between now and our next scheduled telephone outreach call.     Follow-Up Plan:  Scheduling Care Guide will schedule follow-up telephone outreach call for patient with newly Embedded LCSW at [facility] @ [place], [place].       Nat Christen LCSW Licensed Clinical Social Worker [place]  [phone]

## 2021-11-30 NOTE — Progress Notes (Signed)
Cardiac Individual Treatment Plan  Patient Details  Name: Sabrina Montoya MRN: [id number] Date of Birth: 1941-06-07 Referring Provider:   Flowsheet Row Cardiac Rehab from 10/04/2021 in [facility] Cardiac and Pulmonary Rehab  Referring Provider Harle Stanford MD       Initial Encounter Date:  Flowsheet Row Cardiac Rehab from 10/04/2021 in [facility] Cardiac and Pulmonary Rehab  Date 10/04/21       Visit Diagnosis: NSTEMI (non-ST elevated myocardial infarction) [facility])  Patient's Home Medications on Admission:  Current Outpatient Medications:    amLODipine (NORVASC) 5 MG tablet, Take by mouth., Disp: , Rfl:    amoxicillin-clavulanate (AUGMENTIN) 875-125 MG tablet, Take 1 tablet by mouth 2 (two) times daily., Disp: 14 tablet, Rfl: 0   aspirin 81 MG tablet, Take 81 mg by mouth daily., Disp: , Rfl:    cholecalciferol (VITAMIN D) 1000 UNITS tablet, Take 1,000 Units by mouth daily., Disp: , Rfl:    clonazePAM (KLONOPIN) 0.5 MG tablet, Take by mouth., Disp: , Rfl:    conjugated estrogens (PREMARIN) vaginal cream, Place vaginally as needed. (Patient not taking: No sig reported), Disp: , Rfl:    cyanocobalamin 1000 MCG tablet, Take by mouth., Disp: , Rfl:    isosorbide mononitrate (IMDUR) 60 MG 24 hr tablet, Take 1 tablet (60 mg total) by mouth daily., Disp: 30 tablet, Rfl: 0   metoprolol succinate (TOPROL-XL) 100 MG 24 hr tablet, Take by mouth., Disp: , Rfl:    Multiple Vitamins-Minerals (PRESERVISION AREDS 2 PO), Take by mouth 2 (two) times daily., Disp: , Rfl:    nitroGLYCERIN (NITROSTAT) 0.4 MG SL tablet, Place 1 tablet (0.4 mg total) under the tongue every 5 (five) minutes as needed for chest pain., Disp: 50 tablet, Rfl: 3   omeprazole (PRILOSEC) 20 MG capsule, TAKE 1 CAPSULE(20 MG) BY MOUTH DAILY, Disp: 90 capsule, Rfl: 1   predniSONE (DELTASONE) 10 MG tablet, 6 tablets on Day 1 , then reduce by 1 tablet daily until gone, Disp: 21 tablet, Rfl: 0   rosuvastatin (CRESTOR) 40 MG tablet, Take 1 tablet (40  mg total) by mouth daily., Disp: 90 tablet, Rfl: 0   Turmeric (QC TUMERIC COMPLEX PO), Take 1 mg by mouth daily., Disp: , Rfl:   Past Medical History: Past Medical History:  Diagnosis Date   Abdominal aortic aneurysm without mention of rupture    infrarenal, stable, folllowed by Leotis Pain   Acute posthemorrhagic anemia    Arthritis    B12 deficiency    CAD (coronary artery disease), autologous vein bypass graft    Cardiac dysrhythmia, unspecified    Colon polyp    Depression    Diverticulitis of colon    Dizziness    DUE TO MEDICINES   Dysrhythmia    GERD (gastroesophageal reflux disease)    HOH (hard of hearing)    Hyperlipidemia    Hypertension    pt denies. placed on meds after CABG   IBS (irritable bowel syndrome)    Neuropathy    Neuropathy 07/01/2013   Peripheral vascular disease ([place])    s/p CEA    Reflux esophagitis    Rheumatic fever    possible at age 3   Sliding hiatal hernia    Tobacco abuse    Tobacco abuse     Tobacco Use: Social History   Tobacco Use  Smoking Status Former   Packs/day: 0.50   Years: 30.00   Pack years: 15.00   Types: Cigarettes   Quit date: 04/01/2012   Years  since quitting: 9.6  Smokeless Tobacco Never  Tobacco Comments   quit for 2 years after sinus infection and 6 months after heart surgery    Labs: Recent Review Flowsheet Data     Labs for ITP Cardiac and Pulmonary Rehab Latest Ref Rng & Units 10/23/2018 05/20/2019 05/13/2020 04/27/2021 06/06/2021   Cholestrol 0 - 200 mg/dL 106 119 119 141 116   LDLCALC 0 - 99 mg/dL 32 57 44 63 48   LDLDIRECT mg/dL - - - - -   HDL >40 mg/dL 40.10 40.80 38.20(L) 44.60 44   Trlycerides <150 mg/dL 168.0(H) 106.0 186.0(H) 169.0(H) 122   Hemoglobin A1c 4.8 - 5.6 % 6.8(H) 6.7(H) 6.6(H) 7.3(H) 7.0(H)        Exercise Target Goals: Exercise Program Goal: Individual exercise prescription set using results from initial 6 min walk test and THRR while considering  patients activity barriers and  safety.   Exercise Prescription Goal: Initial exercise prescription builds to 30-45 minutes a day of aerobic activity, 2-3 days per week.  Home exercise guidelines will be given to patient during program as part of exercise prescription that the participant will acknowledge.   Education: Aerobic Exercise: - Group verbal and visual presentation on the components of exercise prescription. Introduces F.I.T.T principle from ACSM for exercise prescriptions.  Reviews F.I.T.T. principles of aerobic exercise including progression. Written material given at graduation. Flowsheet Row Cardiac Rehab from 11/17/2021 in [facility] Cardiac and Pulmonary Rehab  Education need identified 10/04/21       Education: Resistance Exercise: - Group verbal and visual presentation on the components of exercise prescription. Introduces F.I.T.T principle from ACSM for exercise prescriptions  Reviews F.I.T.T. principles of resistance exercise including progression. Written material given at graduation. Flowsheet Row Cardiac Rehab from 11/17/2021 in [facility] Cardiac and Pulmonary Rehab  Date 11/10/21  Educator [facility]  Instruction Review Code 1- Verbalizes Understanding        Education: Exercise & Equipment Safety: - Individual verbal instruction and demonstration of equipment use and safety with use of the equipment. Flowsheet Row Cardiac Rehab from 11/17/2021 in [facility] Cardiac and Pulmonary Rehab  Date 08/18/21  Educator [facility]  Instruction Review Code 1- Verbalizes Understanding       Education: Exercise Physiology & General Exercise Guidelines: - Group verbal and written instruction with models to review the exercise physiology of the cardiovascular system and associated critical values. Provides general exercise guidelines with specific guidelines to those with heart or lung disease.  Flowsheet Row Cardiac Rehab from 11/17/2021 in [facility] Cardiac and Pulmonary Rehab  Date 10/27/21  Educator [facility]  Instruction Review Code 1-  Verbalizes Understanding       Education: Flexibility, Balance, Mind/Body Relaxation: - Group verbal and visual presentation with interactive activity on the components of exercise prescription. Introduces F.I.T.T principle from ACSM for exercise prescriptions. Reviews F.I.T.T. principles of flexibility and balance exercise training including progression. Also discusses the mind body connection.  Reviews various relaxation techniques to help reduce and manage stress (i.e. Deep breathing, progressive muscle relaxation, and visualization). Balance handout provided to take home. Written material given at graduation. Flowsheet Row Cardiac Rehab from 11/17/2021 in [facility] Cardiac and Pulmonary Rehab  Date 11/17/21  Educator [facility]  Instruction Review Code 1- Verbalizes Understanding       Activity Barriers & Risk Stratification:  Activity Barriers & Cardiac Risk Stratification - 10/04/21 1448       Activity Barriers & Cardiac Risk Stratification   Activity Barriers Back Problems;Neck/Spine Problems;Arthritis;Deconditioning;Muscular Weakness;Other (comment)  Comments History of rotator cuff on right    Cardiac Risk Stratification High             6 Minute Walk:  6 Minute Walk     Row Name 10/04/21 1452         6 Minute Walk   Phase Initial     Distance 800 feet     Walk Time 5.5 minutes  Break 2:38-3:04     # of Rest Breaks 1     MPH 1.65     METS 1.32     RPE 12     Perceived Dyspnea  3     VO2 Peak 4.62     Symptoms Yes (comment)     Comments SOB     Resting HR 57 bpm     Resting BP 110/66     Resting Oxygen Saturation  97 %     Exercise Oxygen Saturation  during 6 min walk 100 %     Max Ex. HR 76 bpm     Max Ex. BP 132/66     2 Minute Post BP 112/62              Oxygen Initial Assessment:   Oxygen Re-Evaluation:   Oxygen Discharge (Final Oxygen Re-Evaluation):   Initial Exercise Prescription:  Initial Exercise Prescription - 10/04/21 1500       Date  of Initial Exercise RX and Referring Provider   Date 10/04/21    Referring Provider Harle Stanford MD      Recumbant Bike   Level 1    RPM 60    Minutes 15    METs 1.3      NuStep   Level 1    SPM 80    Minutes 15    METs 1.3      T5 Nustep   Level 1    SPM 80    Minutes 15    METs 1.3      Track   Laps 13    Minutes 15    METs 1.71      Prescription Details   Frequency (times per week) 2    Duration Progress to 30 minutes of continuous aerobic without signs/symptoms of physical distress      Intensity   THRR 40-80% of Max Heartrate 90-124    Ratings of Perceived Exertion 11-13    Perceived Dyspnea 0-4      Progression   Progression Continue progressive overload as per policy without signs/symptoms or physical distress.      Resistance Training   Training Prescription Yes    Weight 3 lb    Reps 10-15             Perform Capillary Blood Glucose checks as needed.  Exercise Prescription Changes:   Exercise Prescription Changes     Row Name 10/04/21 1500 10/17/21 1300 10/25/21 1100 11/02/21 0800 11/28/21 1000     Response to Exercise   Blood Pressure (Admit) 110/66 128/64 -- 106/58 124/60   Blood Pressure (Exercise) 132/66 132/64 -- 118/60 128/64   Blood Pressure (Exit) 112/62 122/60 -- 106/64 104/60   Heart Rate (Admit) 57 bpm 78 bpm -- 54 bpm 53 bpm   Heart Rate (Exercise) 76 bpm 89 bpm -- 89 bpm 97 bpm   Heart Rate (Exit) 55 bpm 68 bpm -- 70 bpm 54 bpm   Oxygen Saturation (Admit) 97 % -- -- -- --   Oxygen Saturation (Exercise) 100 % -- -- -- --  Oxygen Saturation (Exit) 100 % -- -- -- --   Rating of Perceived Exertion (Exercise) 12 12 -- 13 14   Perceived Dyspnea (Exercise) 3 -- -- -- --   Symptoms SOB -- -- none none   Comments walk test results fourth exercise day -- -- --   Duration -- Progress to 30 minutes of  aerobic without signs/symptoms of physical distress -- Continue with 30 min of aerobic exercise without signs/symptoms of physical  distress. Continue with 30 min of aerobic exercise without signs/symptoms of physical distress.   Intensity -- THRR unchanged -- THRR unchanged THRR unchanged     Progression   Progression -- Continue to progress workloads to maintain intensity without signs/symptoms of physical distress. -- Continue to progress workloads to maintain intensity without signs/symptoms of physical distress. Continue to progress workloads to maintain intensity without signs/symptoms of physical distress.   Average METs -- 2.65 -- 2.37 2.26     Resistance Training   Training Prescription -- Yes -- Yes Yes   Weight -- 3 lb -- 3 lb 3 lb   Reps -- 10-15 -- 10-15 10-15     Interval Training   Interval Training -- -- -- No No     Treadmill   MPH -- -- -- -- 1.1   Grade -- -- -- -- 0.5   Minutes -- -- -- -- 15   METs -- -- -- -- 1.9     Recumbant Bike   Level -- 1 -- 1 --   Minutes -- 15 -- 15 --   METs -- 2.9 -- 2.9 --     NuStep   Level -- 1 -- 4 5   Minutes -- 15 -- 15 15   METs -- 2.4 -- 2.8 2.4     T5 Nustep   Level -- -- -- 1 1   Minutes -- -- -- 15 15   METs -- -- -- 1.8 2     Track   Laps -- -- -- 18 18   Minutes -- -- -- 15 15   METs -- -- -- 1.98 1.98     Home Exercise Plan   Plans to continue exercise at -- -- Home (comment)  walking, Youtube videos Home (comment)  walking, Youtube videos Home (comment)  walking, Youtube videos   Frequency -- -- Add 3 additional days to program exercise sessions.  start with 1 Add 3 additional days to program exercise sessions.  start with 1 Add 3 additional days to program exercise sessions.  start with 1   Initial Home Exercises Provided -- -- 10/25/21 10/25/21 10/25/21     Oxygen   Maintain Oxygen Saturation -- -- -- 88% or higher 88% or higher            Exercise Comments:   Exercise Comments     Row Name 10/06/21 1038           Exercise Comments First full day of exercise!  Patient was oriented to gym and equipment including  functions, settings, policies, and procedures.  Patient's individual exercise prescription and treatment plan were reviewed.  All starting workloads were established based on the results of the 6 minute walk test done at initial orientation visit.  The plan for exercise progression was also introduced and progression will be customized based on patient's performance and goals.                Exercise Goals and Review:   Exercise Goals  [place] Name 10/04/21 1531             Exercise Goals   Increase Physical Activity Yes       Intervention Provide advice, education, support and counseling about physical activity/exercise needs.;Develop an individualized exercise prescription for aerobic and resistive training based on initial evaluation findings, risk stratification, comorbidities and participant's personal goals.       Expected Outcomes Short Term: Attend rehab on a regular basis to increase amount of physical activity.;Long Term: Add in home exercise to make exercise part of routine and to increase amount of physical activity.;Long Term: Exercising regularly at least 3-5 days a week.       Increase Strength and Stamina Yes       Intervention Provide advice, education, support and counseling about physical activity/exercise needs.;Develop an individualized exercise prescription for aerobic and resistive training based on initial evaluation findings, risk stratification, comorbidities and participant's personal goals.       Expected Outcomes Short Term: Increase workloads from initial exercise prescription for resistance, speed, and METs.;Short Term: Perform resistance training exercises routinely during rehab and add in resistance training at home;Long Term: Improve cardiorespiratory fitness, muscular endurance and strength as measured by increased METs and functional capacity (6MWT)       Able to understand and use rate of perceived exertion (RPE) scale Yes       Intervention Provide  education and explanation on how to use RPE scale       Expected Outcomes Short Term: Able to use RPE daily in rehab to express subjective intensity level;Long Term:  Able to use RPE to guide intensity level when exercising independently       Able to understand and use Dyspnea scale Yes       Intervention Provide education and explanation on how to use Dyspnea scale       Expected Outcomes Short Term: Able to use Dyspnea scale daily in rehab to express subjective sense of shortness of breath during exertion;Long Term: Able to use Dyspnea scale to guide intensity level when exercising independently       Knowledge and understanding of Target Heart Rate Range (THRR) Yes       Intervention Provide education and explanation of THRR including how the numbers were predicted and where they are located for reference       Expected Outcomes Short Term: Able to state/look up THRR;Long Term: Able to use THRR to govern intensity when exercising independently;Short Term: Able to use daily as guideline for intensity in rehab       Able to check pulse independently Yes       Intervention Provide education and demonstration on how to check pulse in carotid and radial arteries.;Review the importance of being able to check your own pulse for safety during independent exercise       Expected Outcomes Short Term: Able to explain why pulse checking is important during independent exercise;Long Term: Able to check pulse independently and accurately       Understanding of Exercise Prescription Yes       Intervention Provide education, explanation, and written materials on patient's individual exercise prescription       Expected Outcomes Short Term: Able to explain program exercise prescription;Long Term: Able to explain home exercise prescription to exercise independently                Exercise Goals Re-Evaluation :  Exercise Goals Re-Evaluation     [place] Name 10/06/21 1038 10/17/21 1340  10/25/21 1133 11/02/21  0804 11/08/21 1140     Exercise Goal Re-Evaluation   Exercise Goals Review Increase Physical Activity;Able to understand and use rate of perceived exertion (RPE) scale;Knowledge and understanding of Target Heart Rate Range (THRR);Understanding of Exercise Prescription;Increase Strength and Stamina;Able to understand and use Dyspnea scale;Able to check pulse independently Increase Physical Activity;Increase Strength and Stamina Increase Physical Activity;Increase Strength and Stamina Increase Physical Activity;Increase Strength and Stamina;Understanding of Exercise Prescription Increase Physical Activity;Increase Strength and Stamina;Understanding of Exercise Prescription   Comments Reviewed RPE and dyspnea scales, THR and program prescription with pt today.  Pt voiced understanding and was given a copy of goals to take home. Crystol has done well in her first few sessions.  Staff will monitor progress. Reviewed home exercise with pt today.  Pt plans to walk and do Youtube staff videos for exercise.  Reviewed THR, pulse, RPE, sign and symptoms, pulse oximetery and when to call 911 or MD.  Also discussed weather considerations and indoor options.  Pt voiced understanding. Curtina is doing well in rehab.  Sh eis on level 4 for the NuStep and 18 laps on the track.  We will continue to monitor her progress. Drenda is doing well in rehab.  She is not walking at home and not doing much cardio due to her back pain.  She did get some hand weights for Christmas from her daughter.  She is doing some leg exercises at home.  We talked about trying the chair routines on our YouTube channel.  I resent it to her.   Expected Outcomes Short: Use RPE daily to regulate intensity. Long: Follow program prescription in THR. Short:  attend consistently  Long:  improve overall stamina Short: Add on 1 day of exercise at home and check HR Long: Exercise independently at home at appropriate prescription Short: Move up on T5 NuStep.  Long: Conitnue  to improve stamina. Short: Try chair routines at home Long: Conitnue to strengthen back    [place] Name 11/28/21 1048             Exercise Goal Re-Evaluation   Exercise Goals Review Increase Physical Activity;Increase Strength and Stamina       Comments Kesley's attendance has improved and she was able to walk on the treadmill for the first time and tolerated it well. She also increased her level on the T4 Nustep to level 5 which is a huge improvement  She would beneift from increasing to 4 lbs for handweights.       Expected Outcomes Short: Increase to 4 lbs for handweights Long: Continue to increase MET level                Discharge Exercise Prescription (Final Exercise Prescription Changes):  Exercise Prescription Changes - 11/28/21 1000       Response to Exercise   Blood Pressure (Admit) 124/60    Blood Pressure (Exercise) 128/64    Blood Pressure (Exit) 104/60    Heart Rate (Admit) 53 bpm    Heart Rate (Exercise) 97 bpm    Heart Rate (Exit) 54 bpm    Rating of Perceived Exertion (Exercise) 14    Symptoms none    Duration Continue with 30 min of aerobic exercise without signs/symptoms of physical distress.    Intensity THRR unchanged      Progression   Progression Continue to progress workloads to maintain intensity without signs/symptoms of physical distress.    Average METs 2.26      Resistance Training  Training Prescription Yes    Weight 3 lb    Reps 10-15      Interval Training   Interval Training No      Treadmill   MPH 1.1    Grade 0.5    Minutes 15    METs 1.9      NuStep   Level 5    Minutes 15    METs 2.4      T5 Nustep   Level 1    Minutes 15    METs 2      Track   Laps 18    Minutes 15    METs 1.98      Home Exercise Plan   Plans to continue exercise at Home (comment)   walking, Youtube videos   Frequency Add 3 additional days to program exercise sessions.   start with 1   Initial Home Exercises Provided 10/25/21      Oxygen    Maintain Oxygen Saturation 88% or higher             Nutrition:  Target Goals: Understanding of nutrition guidelines, daily intake of sodium <1515m, cholesterol <2011m calories 30% from fat and 7% or less from saturated fats, daily to have 5 or more servings of fruits and vegetables.  Education: All About Nutrition: -Group instruction provided by verbal, written material, interactive activities, discussions, models, and posters to present general guidelines for heart healthy nutrition including fat, fiber, MyPlate, the role of sodium in heart healthy nutrition, utilization of the nutrition label, and utilization of this knowledge for meal planning. Follow up email sent as well. Written material given at graduation. [facility]  Education need identified 10/04/21       Biometrics:  Pre Biometrics - 10/04/21 1450       Pre Biometrics   Height _0  (1.626 m)    Weight 152 lb 9.6 oz (69.2 kg)    BMI (Calculated) 26.18    Single Leg Stand 12.4 seconds              Nutrition Therapy Plan and Nutrition Goals:  Nutrition Therapy & Goals - 10/25/21 1139       Personal Nutrition Goals   Comments She feels that her sweet tooth is an issue for her. She would like to lose 10-15 pounds; discussed weight expectations, changes in body composition as a part of aging, and importance of maintaing muscle tissue, and healthy lifestyle factors like eating health promoting foods and exercising regularly. She would like to do a meal service diet program - unsure the name- she feels that she would spend the same amount that she does on groceries and that she can lose weight. Discussed that meeting calorie and protein needs are important and these meal service diets typically focus on low calories versus nutritional value of foods. If she remembers the name of the program we can review it together. She would like to meet with RD in  person after the new year; RD does not have appointment schedule for all of January yet - will connect with SuKajuanagain next week to schedule.             Nutrition Assessments:  MEDIFICTS Score Key: ?70 Need to make dietary changes  40-70 Heart Healthy Diet ? 40 Therapeutic Level Cholesterol Diet  [facility]  Picture Your Plate Total Score on  Admission 66      Picture Your Plate Scores: <27 Unhealthy dietary pattern with much room for improvement. 41-50 Dietary pattern unlikely to meet recommendations for good health and room for improvement. 51-60 More healthful dietary pattern, with some room for improvement.  >60 Healthy dietary pattern, although there may be some specific behaviors that could be improved.    Nutrition Goals Re-Evaluation:  Nutrition Goals Re-Evaluation     [place] Name 10/25/21 1155 11/08/21 1136           Goals   Comment Medina would like to meet with RD in person after the new year; RD does not have appointment schedule for all of January yet - will connect with Shakyra again next week to schedule. Yamili now has an appoinment set with Melissa for 11/22/21.               Nutrition Goals Discharge (Final Nutrition Goals Re-Evaluation):  Nutrition Goals Re-Evaluation - 11/08/21 1136       Goals   Comment Teara now has an appoinment set with Melissa for 11/22/21.             Psychosocial: Target Goals: Acknowledge presence or absence of significant depression and/or stress, maximize coping skills, provide positive support system. Participant is able to verbalize types and ability to use techniques and skills needed for reducing stress and depression.   Education: Stress, Anxiety, and Depression - Group verbal and visual presentation to define topics covered.  Reviews how body is impacted by stress, anxiety, and depression.  Also discusses healthy ways to reduce stress and to treat/manage  anxiety and depression.  Written material given at graduation.   Education: Sleep Hygiene -Provides group verbal and written instruction about how sleep can affect your health.  Define sleep hygiene, discuss sleep cycles and impact of sleep habits. Review good sleep hygiene tips.    Initial Review & Psychosocial Screening:  Initial Psych Review & Screening - 08/18/21 1042       Initial Review   Current issues with Current Anxiety/Panic      Family Dynamics   Good Support System? Yes    Comments She can look to her two daughters for support. Her family support is up and down. If she needs something they will be there.      Barriers   Psychosocial barriers to participate in program The patient should benefit from training in stress management and relaxation.      Screening Interventions   Interventions Encouraged to exercise;Provide feedback about the scores to participant;To provide support and resources with identified psychosocial needs    Expected Outcomes Short Term goal: Utilizing psychosocial counselor, staff and physician to assist with identification of specific Stressors or current issues interfering with healing process. Setting desired goal for each stressor or current issue identified.;Long Term Goal: Stressors or current issues are controlled or eliminated.;Short Term goal: Identification and review with participant of any Quality of Life or Depression concerns found by scoring the questionnaire.;Long Term goal: The participant improves quality of Life and PHQ9 Scores as seen by post scores and/or verbalization of changes             Quality of Life Scores:   Quality of Life - 10/04/21 1519       Quality of Life   Select Quality of Life      Quality of Life Scores   Health/Function Pre 17.57 %    Socioeconomic Pre 19.33 %    Psych/Spiritual Pre 22.93 %  Family Pre 22.5 %    GLOBAL Pre 19.76 %            Scores of 19 and below usually indicate a poorer  quality of life in these areas.  A difference of  2-3 points is a clinically meaningful difference.  A difference of 2-3 points in the total score of the Quality of Life Index has been associated with significant improvement in overall quality of life, self-image, physical symptoms, and general health in studies assessing change in quality of life.  PHQ-9: Recent Review Flowsheet Data     Depression screen [facility] 2/9 11/08/2021 10/04/2021 09/07/2021 08/18/2021 07/22/2021   Decreased Interest 0 1 0 0 0   Down, Depressed, Hopeless 0 0 0 0 1   PHQ - 2 Score 0 1 0 0 1   Altered sleeping 1  1 - - -   Tired, decreased energy 0 1 - - -   Change in appetite 0 1 - - -   Feeling bad or failure about yourself  0 0 - - -   Trouble concentrating 0 1 - - -   Moving slowly or fidgety/restless 0 0 - - -   Suicidal thoughts 0 0 - - -   PHQ-9 Score 1 5 - - -   Difficult doing work/chores Not difficult at all Not difficult at all - - -      Interpretation of Total Score  Total Score Depression Severity:  1-4 = Minimal depression, 5-9 = Mild depression, 10-14 = Moderate depression, 15-19 = Moderately severe depression, 20-27 = Severe depression   Psychosocial Evaluation and Intervention:  Psychosocial Evaluation - 08/18/21 1044       Psychosocial Evaluation & Interventions   Interventions Encouraged to exercise with the program and follow exercise prescription;Stress management education;Relaxation education    Comments She can look to her two daughters for support. Her family support is up and down. If she needs something they will be there.    Expected Outcomes Short: Start [facility] to help with mood. Long: Maintain a healthy mental state    Continue Psychosocial Services  Follow up required by staff             Psychosocial Re-Evaluation:  Psychosocial Re-Evaluation     [place] Name 10/25/21 1143 11/08/21 1134           Psychosocial Re-Evaluation   Current issues with None Identified History  of Depression;Current Depression;Current Sleep Concerns      Comments Shala reports trying to make it day to day with no major reported stressors. She reports not being on medication for her mental health. 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 She goes in her sunroom with her cat to catch up on her soap operas and watch christmas movies. She enjoyed decorating for CHristmas and staying busy. She reports not sleeping when her back hurts (she takes naps), she reports "basically anytime I am prone, I sleep". Reviewed patient health questionnaire (PHQ-9) with patient for follow up. Previously, patients score indicated signs/symptoms of depression.  Reviewed to see if patient is improving symptom wise while in program.  Score improved and patient states that it is because they have been able to see more friends and family over Christmas.  She has been struggling with her sleep on days she exercises as she will fall asleep in her chair and then wake around 3am and has a hard  time getting back to sleep after that. She continues to get through her daily chores on her own.      Expected Outcomes ST: continue to practice stress reducing activities, try large print books to read LT: maintain positive attitude. Short: Continue to work on sleep Long: Continue to focus on positive things.      Interventions Encouraged to attend Cardiac Rehabilitation for the exercise Encouraged to attend Cardiac Rehabilitation for the exercise      Continue Psychosocial Services  Follow up required by staff Follow up required by staff               Psychosocial Discharge (Final Psychosocial Re-Evaluation):  Psychosocial Re-Evaluation - 11/08/21 1134       Psychosocial Re-Evaluation   Current issues with History of Depression;Current Depression;Current Sleep Concerns    Comments Reviewed patient health questionnaire (PHQ-9) with patient for follow up.  Previously, patients score indicated signs/symptoms of depression.  Reviewed to see if patient is improving symptom wise while in program.  Score improved and patient states that it is because they have been able to see more friends and family over Christmas.  She has been struggling with her sleep on days she exercises as she will fall asleep in her chair and then wake around 3am and has a hard time getting back to sleep after that. She continues to get through her daily chores on her own.    Expected Outcomes Short: Continue to work on sleep Long: Continue to focus on positive things.    Interventions Encouraged to attend Cardiac Rehabilitation for the exercise    Continue Psychosocial Services  Follow up required by staff             Vocational Rehabilitation: Provide vocational rehab assistance to qualifying candidates.   Vocational Rehab Evaluation & Intervention:   Education: Education Goals: Education classes will be provided on a variety of topics geared toward better understanding of heart health and risk factor modification. Participant will state understanding/return demonstration of topics presented as noted by education test scores.  Learning Barriers/Preferences:  Learning Barriers/Preferences - 08/18/21 1041       Learning Barriers/Preferences   Learning Barriers None    Learning Preferences None             General Cardiac Education Topics:  AED/CPR: - Group verbal and written instruction with the use of models to demonstrate the basic use of the AED with the basic ABC's of resuscitation.   Anatomy and Cardiac Procedures: - Group verbal and visual presentation and models provide information about basic cardiac anatomy and function. Reviews the testing methods done to diagnose heart disease and the outcomes of the test results. Describes the treatment choices: Medical Management, Angioplasty, or Coronary Bypass Surgery for treating various heart conditions  including Myocardial Infarction, Angina, Valve Disease, and Cardiac Arrhythmias.  Written material given at graduation. Flowsheet Row Cardiac Rehab from 11/17/2021 in [facility] Cardiac and Pulmonary Rehab  Date 11/10/21  Educator SB  Instruction Review Code 1- Verbalizes Understanding       Medication Safety: - Group verbal and visual instruction to review commonly prescribed medications for heart and lung disease. Reviews the medication, class of the drug, and side effects. Includes the steps to properly store meds and maintain the prescription regimen.  Written material given at graduation.   Intimacy: - Group verbal instruction through game format to discuss how heart and lung disease can affect sexual intimacy. Written material given at graduation.Marland Kitchen  Know Your Numbers and Heart Failure: - Group verbal and visual instruction to discuss disease risk factors for cardiac and pulmonary disease and treatment options.  Reviews associated critical values for Overweight/Obesity, Hypertension, Cholesterol, and Diabetes.  Discusses basics of heart failure: signs/symptoms and treatments.  Introduces Heart Failure Zone chart for action plan for heart failure.  Written material given at graduation. Flowsheet Row Cardiac Rehab from 11/17/2021 in [facility] Cardiac and Pulmonary Rehab  Date 10/06/21  Educator SB  Instruction Review Code 1- Verbalizes Understanding       Infection Prevention: - Provides verbal and written material to individual with discussion of infection control including proper hand washing and proper equipment cleaning during exercise session. Flowsheet Row Cardiac Rehab from 11/17/2021 in [facility] Cardiac and Pulmonary Rehab  Date 08/18/21  Educator [facility]  Instruction Review Code 1- Verbalizes Understanding       Falls Prevention: - Provides verbal and written material to individual with discussion of falls prevention and safety. Flowsheet Row Cardiac Rehab from 11/17/2021 in [facility] Cardiac  and Pulmonary Rehab  Date 08/18/21  Educator [facility]  Instruction Review Code 1- Verbalizes Understanding       Other: -Provides group and verbal instruction on various topics (see comments)   Knowledge Questionnaire Score:  Knowledge Questionnaire Score - 10/04/21 1454       Knowledge Questionnaire Score   Pre Score 22/26: Angina, Aerobic exercise, Nutrition             Core Components/Risk Factors/Patient Goals at Admission:  Personal Goals and Risk Factors at Admission - 10/04/21 1531       Core Components/Risk Factors/Patient Goals on Admission    Weight Management Yes;Weight Loss    Intervention Weight Management: Develop a combined nutrition and exercise program designed to reach desired caloric intake, while maintaining appropriate intake of nutrient and fiber, sodium and fats, and appropriate energy expenditure required for the weight goal.;Weight Management: Provide education and appropriate resources to help participant work on and attain dietary goals.;Weight Management/Obesity: Establish reasonable short term and long term weight goals.    Admit Weight 152 lb (68.9 kg)    Goal Weight: Short Term 149 lb (67.6 kg)    Goal Weight: Long Term 145 lb (65.8 kg)    Expected Outcomes Short Term: Continue to assess and modify interventions until short term weight is achieved;Long Term: Adherence to nutrition and physical activity/exercise program aimed toward attainment of established weight goal;Weight Loss: Understanding of general recommendations for a balanced deficit meal plan, which promotes 1-2 lb weight loss per week and includes a negative energy balance of [phone] kcal/d;Understanding recommendations for meals to include 15-35% energy as protein, 25-35% energy from fat, 35-60% energy from carbohydrates, less than 274m of dietary cholesterol, 20-35 gm of total fiber daily;Understanding of distribution of calorie intake throughout the day with the consumption of 4-5  meals/snacks    Diabetes Yes    Intervention Provide education about signs/symptoms and action to take for hypo/hyperglycemia.;Provide education about proper nutrition, including hydration, and aerobic/resistive exercise prescription along with prescribed medications to achieve blood glucose in normal ranges: Fasting glucose 65-99 mg/dL    Expected Outcomes Short Term: Participant verbalizes understanding of the signs/symptoms and immediate care of hyper/hypoglycemia, proper foot care and importance of medication, aerobic/resistive exercise and nutrition plan for blood glucose control.;Long Term: Attainment of HbA1C < 7%.    Hypertension Yes    Intervention Provide education on lifestyle modifcations including regular physical activity/exercise, weight management, moderate sodium restriction and increased consumption  of fresh fruit, vegetables, and low fat dairy, alcohol moderation, and smoking cessation.;Monitor prescription use compliance.    Expected Outcomes Short Term: Continued assessment and intervention until BP is < 140/74m HG in hypertensive participants. < 130/818mHG in hypertensive participants with diabetes, heart failure or chronic kidney disease.;Long Term: Maintenance of blood pressure at goal levels.    Lipids Yes    Intervention Provide education and support for participant on nutrition & aerobic/resistive exercise along with prescribed medications to achieve LDL <7048mHDL >16m27m  Expected Outcomes Short Term: Participant states understanding of desired cholesterol values and is compliant with medications prescribed. Participant is following exercise prescription and nutrition guidelines.;Long Term: Cholesterol controlled with medications as prescribed, with individualized exercise RX and with personalized nutrition plan. Value goals: LDL < 70mg22mL > 40 mg.             Education:Diabetes - Individual verbal and written instruction to review signs/symptoms of diabetes,  desired ranges of glucose level fasting, after meals and with exercise. Acknowledge that pre and post exercise glucose checks will be done for 3 sessions at entry of program. [facility]  Date 08/18/21  Educator J[facility]  [facility]ruction Review Code 1- Verbalizes Understanding       Core Components/Risk Factors/Patient Goals Review:   Goals and Risk Factor Review     Row Name 10/25/21 1127 11/08/21 1136           Core Components/Risk Factors/Patient Goals Review   Personal Goals Review Lipids;Diabetes Weight Management/Obesity;Diabetes;Hypertension;Lipids      Review She has been having some consistent chest pain and her arm has been aching - she switched doctors, but is not sure she made the right decision, she has not spoken to him about the pain she is having; encouraged her to talk with her doctor. She reports that the only things she can do are the basics because she is so tired and weak. She has recently made two pots of soup so she has easy meals on her bad days. SHe does not normally check her BG and manages her T2DM with diet - she is not sure what her A1C is. She had recently had COVID for a month - she reports tht she had a rough time recovering. She reports having back pain and has to stop and rest sometimes when she is active. Teshia iBraxtonoing well in rehab.  She enjoyed Christmas fesitivies.  Her weight has gone up some as a result, but she hopes to get it back down now.  Her blood sugars have not felt like they have given her any problems.  She has not been checking it at home.  Her blood pressurs are doing well. She continues to have chest pain and had follow up last week and they told her it was just her angina.  They did not change her prescriptions at all.      Expected Outcomes ST: speak with MD about chest pain LT: monitor risk factors Short: Continue to work with chest pain Long: Continue to monitor risk factors.                Core Components/Risk Factors/Patient Goals at Discharge (Final Review):   Goals and Risk Factor Review - 11/08/21 1136       Core Components/Risk Factors/Patient Goals Review   Personal Goals Review Weight Management/Obesity;Diabetes;Hypertension;Lipids    Review Camree iBrynlieoing well in rehab.  She enjoyed  Christmas fesitivies.  Her weight has gone up some as a result, but she hopes to get it back down now.  Her blood sugars have not felt like they have given her any problems.  She has not been checking it at home.  Her blood pressurs are doing well. She continues to have chest pain and had follow up last week and they told her it was just her angina.  They did not change her prescriptions at all.    Expected Outcomes Short: Continue to work with chest pain Long: Continue to monitor risk factors.             ITP Comments:  ITP Comments     Row Name 08/18/21 1047 10/04/21 1151 10/05/21 0952 10/06/21 1038 11/02/21 0637   ITP Comments Virtual Visit completed. Patient informed on EP and RD appointment and 6 Minute walk test. Patient also informed of patient health questionnaires on My Chart. Patient Verbalizes understanding. Visit diagnosis can be found in [facility] 06/05/2021. Completed 6MWT and gym orientation. Initial ITP created and sent for review to Dr. Emily Filbert, Medical Director. 30 Day review completed. Medical Director ITP review done, changes made as directed, and signed approval by Medical Director.    New to program First full day of exercise!  Patient was oriented to gym and equipment including functions, settings, policies, and procedures.  Patient's individual exercise prescription and treatment plan were reviewed.  All starting workloads were established based on the results of the 6 minute walk test done at initial orientation visit.  The plan for exercise progression was also introduced and progression will be customized based on patient's performance and goals. 30 Day review  completed. Medical Director ITP review done, changes made as directed, and signed approval by Medical Director.    [place] Name 11/30/21 0805           ITP Comments 30 Day review completed. Medical Director ITP review done, changes made as directed, and signed approval by Medical Director.                Comments:

## 2021-11-30 NOTE — Telephone Encounter (Signed)
Received message from LCSW that patient had a medication question. Called patient, left voicemail for her to return my call at her convenience.

## 2021-11-30 NOTE — Patient Instructions (Signed)
Visit Information  Thank you for taking time to visit with me today. Please don't hesitate to contact me if I can be of assistance to you before our next scheduled telephone appointment.  Following are the goals we discussed today:  Patient Goals/Self-Care Activities: Continue to receive personal counseling with LCSW, on bi-weekly basis, to reduce and manage symptoms of Anxiety and Stress, until manageable.     Congratulations on compliance with attending most Cardiac Rehabilitation appointments at the [facility], scheduled for every Tuesday and Thursday until 01/31/2022. Please reschedule follow-up appointment with Primary Care Physician, Dr. Deborra Medina by contacting the office receptionist at [facility] [phone]). Contact LCSW directly if you have questions, need assistance, or if additional social work needs are identified between now and our next scheduled telephone outreach call.     Follow-Up Plan:  Scheduling Care Guide will schedule follow-up telephone outreach call for patient with newly Embedded LCSW at [facility] [place], [place].    Please call the care guide team at [phone] if you need to cancel or reschedule your appointment.   If you are experiencing a Mental Health or [place] or need someone to talk to, please call the Suicide and Crisis Lifeline: 988 call the [place] National Suicide Prevention Lifeline: 347-187-6973 or TTY: 929-172-3291 TTY 306-040-8112) to talk to a trained counselor call 1-800-273-TALK (toll free, 24 hour hotline) go to [facility] [address], [place] [phone]) call the [place]: 517-537-2860 call 911   Patient verbalizes understanding of instructions and care plan provided today and agrees to view in [place]. Active MyChart status confirmed with patient.     Nat Christen LCSW Licensed Clinical Social Worker [place]  [phone]

## 2021-12-01 ENCOUNTER — Telehealth: Payer: Self-pay | Admitting: Internal Medicine

## 2021-12-01 ENCOUNTER — Other Ambulatory Visit: Payer: Self-pay

## 2021-12-01 DIAGNOSIS — Z87891 Personal history of nicotine dependence: Secondary | ICD-10-CM | POA: Diagnosis not present

## 2021-12-01 DIAGNOSIS — I214 Non-ST elevation (NSTEMI) myocardial infarction: Secondary | ICD-10-CM

## 2021-12-01 NOTE — Telephone Encounter (Signed)
Pt called in stating that she is having problems. Pt stated that she stay tired all the time. Pt stated that one of her doctors advise her to speak with someone about her medications that she is on, so they can figure out why she is so tired. Pt would like to speak with you about the medications and she have questions about two of the heart medication she is on. Pt requesting callback.

## 2021-12-01 NOTE — Telephone Encounter (Signed)
Called patient. She reports significant fatigue daily, resulting in her falling asleep on the couch several times. Reports home blood pressure readings with SBP <100, but difficult to ascertain how often she is seeing these types of readings vs more normal readings. She was not at home when I called her so she was unable to review specific readings or review her medications with her. Denies any syncopal episodes.   Discussed contacting cardiology [facility]), but she declines, noting she feels she never gets help or clarification from that provider. She is interested in a referral to a different cardiologist.   She requests a visit. Scheduled first available with [place] next week. Advised patient to bring her medications and any home BP readings.   Routing to PCP and [facility].

## 2021-12-01 NOTE — Progress Notes (Signed)
Daily Session Note  Patient Details  Name: ESCARLET SAATHOFF MRN: [id number] Date of Birth: 1941-01-14 Referring Provider:   Flowsheet Row Cardiac Rehab from 10/04/2021 in [facility] Cardiac and Pulmonary Rehab  Referring Provider Harle Stanford MD       Encounter Date: 12/01/2021  Check In:  Session Check In - 12/01/21 1036       Check-In   Supervising physician immediately available to respond to emergencies See telemetry face sheet for immediately available ER MD    Location [facility]-Cardiac & Pulmonary Rehab    Staff Present Birdie Sons, MPA, RN;Joseph [place], RCP,RRT,BSRT;Amanda [place], BA, ACSM CEP, Exercise Physiologist    Virtual Visit No    Medication changes reported     No    Fall or balance concerns reported    No    Tobacco Cessation No Change    Warm-up and Cool-down Performed on first and last piece of equipment    Resistance Training Performed Yes    VAD Patient? No    PAD/SET Patient? No      Pain Assessment   Currently in Pain? No/denies                Social History   Tobacco Use  Smoking Status Former   Packs/day: 0.50   Years: 30.00   Pack years: 15.00   Types: Cigarettes   Quit date: 04/01/2012   Years since quitting: 9.6  Smokeless Tobacco Never  Tobacco Comments   quit for 2 years after sinus infection and 6 months after heart surgery    Goals Met:  Independence with exercise equipment Exercise tolerated well No report of concerns or symptoms today Strength training completed today  Goals Unmet:  Not Applicable  Comments: Pt able to follow exercise prescription today without complaint.  Will continue to monitor for progression.    Dr. Emily Filbert is Medical Director for [place].  Dr. Ottie Glazier is Medical Director for [facility] Pulmonary Rehabilitation.

## 2021-12-02 NOTE — Telephone Encounter (Signed)
Patient notified and voiced understanding.

## 2021-12-05 ENCOUNTER — Ambulatory Visit: Payer: Medicare Other | Admitting: Pharmacist

## 2021-12-05 ENCOUNTER — Ambulatory Visit (INDEPENDENT_AMBULATORY_CARE_PROVIDER_SITE_OTHER): Payer: Medicare Other | Admitting: Adult Health

## 2021-12-05 ENCOUNTER — Ambulatory Visit (INDEPENDENT_AMBULATORY_CARE_PROVIDER_SITE_OTHER): Payer: Medicare Other

## 2021-12-05 ENCOUNTER — Other Ambulatory Visit: Payer: Self-pay

## 2021-12-05 ENCOUNTER — Encounter: Payer: Self-pay | Admitting: Adult Health

## 2021-12-05 ENCOUNTER — Telehealth: Payer: Self-pay | Admitting: Internal Medicine

## 2021-12-05 VITALS — BP 120/64 | HR 62 | Temp 97.6°F | Resp 16 | Ht 62.0 in | Wt 152.1 lb

## 2021-12-05 DIAGNOSIS — I2510 Atherosclerotic heart disease of native coronary artery without angina pectoris: Secondary | ICD-10-CM

## 2021-12-05 DIAGNOSIS — J439 Emphysema, unspecified: Secondary | ICD-10-CM | POA: Diagnosis not present

## 2021-12-05 DIAGNOSIS — T733XXA Exhaustion due to excessive exertion, initial encounter: Secondary | ICD-10-CM

## 2021-12-05 DIAGNOSIS — I951 Orthostatic hypotension: Secondary | ICD-10-CM | POA: Diagnosis not present

## 2021-12-05 DIAGNOSIS — I214 Non-ST elevation (NSTEMI) myocardial infarction: Secondary | ICD-10-CM | POA: Diagnosis not present

## 2021-12-05 DIAGNOSIS — E1140 Type 2 diabetes mellitus with diabetic neuropathy, unspecified: Secondary | ICD-10-CM

## 2021-12-05 DIAGNOSIS — R079 Chest pain, unspecified: Secondary | ICD-10-CM | POA: Diagnosis not present

## 2021-12-05 DIAGNOSIS — R001 Bradycardia, unspecified: Secondary | ICD-10-CM | POA: Diagnosis not present

## 2021-12-05 DIAGNOSIS — E7800 Pure hypercholesterolemia, unspecified: Secondary | ICD-10-CM

## 2021-12-05 DIAGNOSIS — I25810 Atherosclerosis of coronary artery bypass graft(s) without angina pectoris: Secondary | ICD-10-CM

## 2021-12-05 LAB — MAGNESIUM: Magnesium: 1.9 mg/dL (ref 1.5–2.5)

## 2021-12-05 LAB — COMPREHENSIVE METABOLIC PANEL
ALT: 15 U/L (ref 0–35)
AST: 33 U/L (ref 0–37)
Albumin: 4.5 g/dL (ref 3.5–5.2)
Alkaline Phosphatase: 64 U/L (ref 39–117)
BUN: 34 mg/dL — ABNORMAL HIGH (ref 6–23)
CO2: 27 mEq/L (ref 19–32)
Calcium: 9.6 mg/dL (ref 8.4–10.5)
Chloride: 105 mEq/L (ref 96–112)
Creatinine, Ser: 1.3 mg/dL — ABNORMAL HIGH (ref 0.40–1.20)
GFR: 39.11 mL/min — ABNORMAL LOW (ref >60.00)
Glucose, Bld: 124 mg/dL — ABNORMAL HIGH (ref 70–99)
Potassium: 4.5 mEq/L (ref 3.5–5.1)
Sodium: 143 mEq/L (ref 135–145)
Total Bilirubin: 0.5 mg/dL (ref 0.2–1.2)
Total Protein: 6.8 g/dL (ref 6.0–8.3)

## 2021-12-05 LAB — CBC WITH DIFFERENTIAL/PLATELET
Basophils Absolute: 0 10*3/uL (ref 0.0–0.1)
Basophils Relative: 0.5 % (ref 0.0–3.0)
Eosinophils Absolute: 0.1 10*3/uL (ref 0.0–0.7)
Eosinophils Relative: 2.7 % (ref 0.0–5.0)
HCT: 31 % — ABNORMAL LOW (ref 36.0–46.0)
Hemoglobin: 10.5 g/dL — ABNORMAL LOW (ref 12.0–15.0)
Lymphocytes Relative: 32 % (ref 12.0–46.0)
Lymphs Abs: 1.6 10*3/uL (ref 0.7–4.0)
MCHC: 34 g/dL (ref 30.0–36.0)
MCV: 85.3 fl (ref 78.0–100.0)
Monocytes Absolute: 0.5 10*3/uL (ref 0.1–1.0)
Monocytes Relative: 9 % (ref 3.0–12.0)
Neutro Abs: 2.8 10*3/uL (ref 1.4–7.7)
Neutrophils Relative %: 55.8 % (ref 43.0–77.0)
Platelets: 194 10*3/uL (ref 150.0–400.0)
RBC: 3.63 Mil/uL — ABNORMAL LOW (ref 3.87–5.11)
RDW: 14.2 % (ref 11.5–15.5)
WBC: 5 10*3/uL (ref 4.0–10.5)

## 2021-12-05 LAB — TSH: TSH: 2.69 u[IU]/mL (ref 0.35–5.50)

## 2021-12-05 MED ORDER — METOPROLOL SUCCINATE ER 50 MG PO TB24: 50.0000 mg | ORAL_TABLET | Freq: Every day | ORAL | 1 refills | Status: DC

## 2021-12-05 MED ORDER — METOPROLOL SUCCINATE ER 50 MG PO TB24: 75.0000 mg | ORAL_TABLET | Freq: Every day | ORAL | 1 refills | Status: DC

## 2021-12-05 NOTE — Assessment & Plan Note (Signed)
Increase hydration and dose change on metoprolol.

## 2021-12-05 NOTE — Patient Instructions (Addendum)
Metoprolol XL changed  to 50 mg tablet, please take 1.5 mg tablet once daily for a total of 75 mg.  Record blood pressures and heart rate.    Discontinue the metoprolol as below.  Medications Discontinued During This Encounter  Medication Reason   metoprolol succinate (TOPROL-XL) 100 MG 24 hr tablet Completed Course     Orders Placed This Encounter  Procedures   Ambulatory referral to Cardiology    Referral Priority:   Urgent    Referral Type:   Consultation    Referral Reason:   Specialty Services Required    Referred to Provider:   Minna Merritts, MD    Requested Specialty:   Cardiology    Number of Visits Requested:   1   EKG 12-Lead      Metoprolol Extended-Release Tablets What is this medication? METOPROLOL (me TOE proe lole) treats high blood pressure and heart failure. It may also be used to prevent chest pain (angina). It works by lowering your blood pressure and heart rate, making it easier for your heart to pump blood to the rest of your body. It belongs to a group of medications called beta blockers. This medicine may be used for other purposes; ask your health care provider or pharmacist if you have questions. COMMON BRAND NAME(S): toprol, Toprol XL What should I tell my care team before I take this medication? They need to know if you have any of these conditions: Diabetes Heart or vessel disease like slow heart rate, worsening heart failure, heart block, sick sinus syndrome, or Raynaud's disease Kidney disease Liver disease Lung or breathing disease, like asthma or emphysema Pheochromocytoma Thyroid disease An unusual or allergic reaction to metoprolol, other beta blockers, medications, foods, dyes, or preservatives Pregnant or trying to get pregnant Breast-feeding How should I use this medication? Take this medication by mouth. Take it as directed on the prescription label at the same time every day. Take it with food. You may cut the tablet in half if it is  scored (has a line in the middle of it). This may help you swallow the tablet if the whole tablet is too big. Be sure to take both halves. Do not take just one-half of the tablet. Keep taking it unless your care team tells you to stop. Talk to your care team about the use of this medication in children. While it may be prescribed for children as young as 6 years for selected conditions, precautions do apply. Overdosage: If you think you have taken too much of this medicine contact a poison control center or emergency room at once. NOTE: This medicine is only for you. Do not share this medicine with others. What if I miss a dose? If you miss a dose, take it as soon as you can. If it is almost time for your next dose, take only that dose. Do not take double or extra doses. What may interact with this medication? This medication may interact with the following: Certain medications for blood pressure, heart disease, irregular heartbeat Certain medications for depression, like monoamine oxidase (MAO) inhibitors, fluoxetine, or paroxetine Clonidine Dobutamine Epinephrine Isoproterenol Reserpine This list may not describe all possible interactions. Give your health care provider a list of all the medicines, herbs, non-prescription drugs, or dietary supplements you use. Also tell them if you smoke, drink alcohol, or use illegal drugs. Some items may interact with your medicine. What should I watch for while using this medication? Visit your care team for regular  checks on your progress. Check your blood pressure as directed. Ask your care team what your blood pressure should be. Also, find out when you should contact them. Do not treat yourself for coughs, colds, or pain while you are using this medication without asking your care team for advice. Some medications may increase your blood pressure. You may get drowsy or dizzy. Do not drive, use machinery, or do anything that needs mental alertness until  you know how this medication affects you. Do not stand up or sit up quickly, especially if you are an older patient. This reduces the risk of dizzy or fainting spells. Alcohol may interfere with the effect of this medication. Avoid alcoholic drinks. This medication may increase blood sugar. Ask your care team if changes in diet or medications are needed if you have diabetes. What side effects may I notice from receiving this medication? Side effects that you should report to your care team as soon as possible: Allergic reactions--skin rash, itching, hives, swelling of the face, lips, tongue, or throat Heart failure--shortness of breath, swelling of the ankles, feet, or hands, sudden weight gain, unusual weakness or fatigue Low blood pressure--dizziness, feeling faint or lightheaded, blurry vision Raynaud's--cool, numb, or painful fingers or toes that may change color from pale, to blue, to red Slow heartbeat--dizziness, feeling faint or lightheaded, confusion, trouble breathing, unusual weakness or fatigue Worsening mood, feelings of depression Side effects that usually do not require medical attention (report to your care team if they continue or are bothersome): Change in sex drive or performance Diarrhea Dizziness Fatigue Headache This list may not describe all possible side effects. Call your doctor for medical advice about side effects. You may report side effects to FDA at 1-800-FDA-1088. Where should I keep my medication? Keep out of the reach of children and pets. Store at room temperature between 20 and 25 degrees C (68 and 77 degrees F). Throw away any unused medication after the expiration date. NOTE: This sheet is a summary. It may not cover all possible information. If you have questions about this medicine, talk to your doctor, pharmacist, or health care provider.  2022 Elsevier/Gold Standard (2021-07-12 00:00:00) Bradycardia, Adult Bradycardia is a slower-than-normal heartbeat.  A normal resting heart rate for an adult ranges from 60 to 100 beats per minute. With bradycardia, the resting heart rate is less than 60 beats per minute. Bradycardia can prevent enough oxygen from reaching certain areas of your body when you are active. It can be serious if it keeps enough oxygen from reaching your brain and other parts of your body. Bradycardia is not a problem for everyone. For some healthy adults, a slow resting heart rate is normal. What are the causes? This condition may be caused by: A problem with the heart, including: A problem with the heart's electrical system, such as a heart block. With a heart block, electrical signals between the chambers of the heart are partially or completely blocked, so they are not able to work as they should. A problem with the heart's natural pacemaker (sinus node). Heart disease. A heart attack. Heart damage. Lyme disease. A heart infection. A heart condition that is present at birth (congenital heart defect). Certain medicines that treat heart conditions. Certain conditions, such as hypothyroidism and obstructive sleep apnea. Problems with the balance of chemicals and other substances, like potassium, in the blood. Trauma. Radiation therapy. What increases the risk? You are more likely to develop this condition if you: Are age 59 or older.  Have high blood pressure (hypertension), high cholesterol (hyperlipidemia), or diabetes. Drink heavily, use tobacco or nicotine products, or use drugs. What are the signs or symptoms? Symptoms of this condition include: Light-headedness. Feeling faint or fainting. Fatigue and weakness. Trouble with activity or exercise. Shortness of breath. Chest pain (angina). Drowsiness. Confusion. Dizziness. How is this diagnosed? This condition may be diagnosed based on: Your symptoms. Your medical history. A physical exam. During the exam, your health care provider will listen to your heartbeat  and check your pulse. To confirm the diagnosis, your health care provider may order tests, such as: Blood tests. An electrocardiogram (ECG). This test records the heart's electrical activity. The test can show how fast your heart is beating and whether the heartbeat is steady. A test in which you wear a portable device (event recorder or Holter monitor) to record your heart's electrical activity while you go about your day. An exercise test. How is this treated? Treatment for this condition depends on the cause of the condition and how severe your symptoms are. Treatment may involve: Treatment of the underlying condition. Changing your medicines or how much medicine you take. Having a small, battery-operated device called a pacemaker implanted under the skin. When bradycardia occurs, this device can be used to increase your heart rate and help your heart beat in a regular rhythm. Follow these instructions at home: Lifestyle Manage any health conditions that contribute to bradycardia as told by your health care provider. Follow a heart-healthy diet. A nutrition specialist (dietitian) can help educate you about healthy food options and changes. Follow an exercise program that is approved by your health care provider. Maintain a healthy weight. Try to reduce or manage your stress, such as with yoga or meditation. If you need help reducing stress, ask your health care provider. Do not use any products that contain nicotine or tobacco. These products include cigarettes, chewing tobacco, and vaping devices, such as e-cigarettes. If you need help quitting, ask your health care provider. Do not use illegal drugs. Alcohol use If you drink alcohol: Limit how much you have to: 0-1 drink a day for women who are not pregnant. 0-2 drinks a day for men. Know how much alcohol is in a drink. In the [place] one drink equals one 12 oz bottle of beer (355 mL), one 5 oz glass of wine (148 mL), or one 1 oz glass of  hard liquor (44 mL). General instructions Take over-the-counter and prescription medicines only as told by your health care provider. Keep all follow-up visits. This is important. How is this prevented? In some cases, bradycardia may be prevented by: Treating underlying medical problems. Stopping behaviors or medicines that can trigger the condition. Contact a health care provider if: You feel light-headed or dizzy. You almost faint. You feel weak or are easily fatigued during physical activity. You experience confusion or have memory problems. Get help right away if: You faint. You have chest pains or an irregular heartbeat (palpitations). You have trouble breathing. These symptoms may represent a serious problem that is an emergency. Do not wait to see if the symptoms will go away. Get medical help right away. Call your local emergency services (911 in the [place] Do not drive yourself to the hospital. Summary Bradycardia is a slower-than-normal heartbeat. With bradycardia, the resting heart rate is less than 60 beats per minute. Treatment for this condition depends on the cause. Manage any health conditions that contribute to bradycardia as told by your health care provider. Do  not use any products that contain nicotine or tobacco. These products include cigarettes, chewing tobacco, and vaping devices, such as e-cigarettes. Keep all follow-up visits. This is important. This information is not intended to replace advice given to you by your health care provider. Make sure you discuss any questions you have with your health care provider. Document Revised: 02/13/2021 Document Reviewed: 02/13/2021 Elsevier Patient Education  [place].

## 2021-12-05 NOTE — Telephone Encounter (Signed)
Pt called in stating that her metoprolol is 100mg  but it says to take 75 mg. Pt just had refill and its to early to get prescription. Pt wants to know what to do. Pt would like to be called

## 2021-12-05 NOTE — Progress Notes (Signed)
Chronic lung changes, with similarities of emphysea, does she have pulmonary doctor  ? If she would like we can refer/  No acute cardiopulmonary disease seen on chest xray. 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

## 2021-12-05 NOTE — Patient Instructions (Addendum)
Sabrina Montoya,   It was great talking to you today!  We recommend the Shingrix (shingles) vaccine series and the Tdap (tetanus, diptheria, and pertussis) booster for all over age 80. Both should have a $0 copay on all Medicare plans this year. You can pursue this without a prescription at your local pharmacy, or feel free to call our [place] at [facility] at [phone].  We also recommend you consider getting the updated bivalent COVID-19 booster, at least 2 months after any prior doses. You may consider delaying a booster dose by 3 months from a prior episode of COVID-19 per the CDC.   You can also get this at your outpatient pharmacy OR our [place] at [facility].   We scheduled you for PCP follow up with Dr. Derrel Nip in March and fasting labs before.   Take care!  Catie Darnelle Maffucci, PharmD  Visit Information  Following are the goals we discussed today:  Patient Goals/Self-Care Activities Over the next 90 days, patient will:  - take medications as prescribed check blood pressure periodically, document, and provide at future appointments        Plan: Telephone follow up appointment with care management team member scheduled for:  4 weeks   Catie Darnelle Maffucci, PharmD, [place], CPP Clinical Pharmacist [place] [facility] [phone]     Please call the care guide team at [phone] if you need to cancel or reschedule your appointment.   Print copy of patient instructions, educational materials, and care plan provided in person.

## 2021-12-05 NOTE — Progress Notes (Signed)
Acute Office Visit  Subjective:    Patient ID: Sabrina Montoya, female    DOB: 1941/02/19, 81 y.o.   MRN: [id number]  Chief Complaint  Patient presents with   Fatigue    HPI Patient is in today for follow up on blood pressure. She reports she has felt fatigued since NSTEMI and had covid in October, she feels she has had fatigue since then and some mild brain fog.  She brings blood pressure records from home with readings of 941- 98  a systolic's and also diastolic's 74'Y to 81'K heart rate has been on low heart rate at cardiac rehab, and noted to be 65- 56 on her records.  NSTEMI in August 2022. EKG at [facility] was normal sinus rhythm her metoprolol succinate was doubled from 50mg  to 100 mg at that office visit.  Her heart rate   Denies any orthostatic changes.   She was previously on the metoprolol 50 mg once daily, she is now on metoprolol 100 mg after her MI. Amlodipine  mg 5 daily. On Imdur 60  mg,  She occasionally has cold feeling in her chest and feels like pressure,  also describes reflux symptoms. She also has a burning feeling in the cold.   She sees [facility] for cardiology. She requests refferal to Dr. Candis Musa, many of her friends see him and she prefers this.  Denies any dizziness with positional changes.   Patient  denies any fever, body aches,chills, rash, chest pain, shortness of breath, nausea, vomiting, or diarrhea.  Denies dizziness, lightheadedness, pre syncopal or syncopal episodes.     Past Medical History:  Diagnosis Date   Abdominal aortic aneurysm without mention of rupture    infrarenal, stable, folllowed by Leotis Pain   Acute posthemorrhagic anemia    Arthritis    B12 deficiency    CAD (coronary artery disease), autologous vein bypass graft    Cardiac dysrhythmia, unspecified    Colon polyp    Depression    Diverticulitis of colon    Dizziness    DUE TO MEDICINES   Dysrhythmia    GERD (gastroesophageal reflux disease)    HOH (hard of hearing)     Hyperlipidemia    Hypertension    pt denies. placed on meds after CABG   IBS (irritable bowel syndrome)    Neuropathy    Neuropathy 07/01/2013   Peripheral vascular disease ([place])    s/p CEA    Reflux esophagitis    Rheumatic fever    possible at age 45   Sliding hiatal hernia    Tobacco abuse    Tobacco abuse     Past Surgical History:  Procedure Laterality Date   ABDOMINAL HYSTERECTOMY     ABDOMINAL SURGERY  1976   for pain secondary to scar tissue, s/p apply   APPENDECTOMY  1974   BACK SURGERY     CAROTID ENDARTERECTOMY     CATARACT EXTRACTION W/PHACO Right 10/18/2016   Procedure: CATARACT EXTRACTION PHACO AND INTRAOCULAR LENS PLACEMENT ([place]);  Surgeon: Estill Cotta, MD;  Location: [facility];  Service: Ophthalmology;  Laterality: Right;  Lot # [id number] H [place]: 01:04.0 AP%: 24.4 CDE: 30.44   CATARACT EXTRACTION W/PHACO Left 03/14/2017   Procedure: CATARACT EXTRACTION PHACO AND INTRAOCULAR LENS PLACEMENT ([place])  Left;  Surgeon: Leandrew Koyanagi, MD;  Location: [facility];  Service: Ophthalmology;  Laterality: Left;   CEA     Carotid stenosis found during workup for dysphagia,  [place]  2009   Dr. Hoover Brunette for cervical cord stenosis, C5-6 diskectomy   CHOLECYSTECTOMY  2002   Dr. Pat Patrick   CLOSED MANIPULATION SHOULDER     left shoulder post physical therapy, Right shoulder redo (toggle bolts)   COLONOSCOPY  09/01/14   coronary angiography  01/2011   one occluded artery with collateralization, other arteries patent   CORONARY ARTERY BYPASS GRAFT  2006   s/p triple bypass surgery, [facility]   heart catherization  2016   HERNIA REPAIR  may 2011   Dr. Pat Patrick   hernia repair     LEFT HEART CATH AND CORONARY ANGIOGRAPHY N/A 06/07/2021   Procedure: LEFT HEART CATH AND CORONARY ANGIOGRAPHY;  Surgeon: Isaias Cowman, MD;  Location: [facility];  Service: Cardiovascular;  Laterality: N/A;   LUMBAR DISC SURGERY  1971   L5,  unssuccessful, fusion in 1983 successful (2 lumbar)   PARTIAL HYSTERECTOMY     ROTATOR CUFF REPAIR  2002   right shoulder Dr. Nadean Corwin, [facility] in [place]      Family History  Problem Relation Age of Onset   Diabetes Mother    Heart disease Mother    Other Father        suicide   Cancer Sister        cervical cancer   Diabetes Sister    Diabetes Sister    Cancer Sister        breast   Breast cancer Sister    Heart disease Brother        coronary artery disease   Other Brother        suicide   Other Other        colon resection due to inflammation -nephew   Bladder Cancer Neg Hx    Kidney cancer Neg Hx     Social History   Socioeconomic History   Marital status: Widowed    Spouse name: Not on file   Number of children: 2   Years of education: 77   Highest education level: 12th grade  Occupational History   Not on file  Tobacco Use   Smoking status: Former    Packs/day: 0.50    Years: 30.00    Pack years: 15.00    Types: Cigarettes    Quit date: 04/01/2012    Years since quitting: 9.6   Smokeless tobacco: Never   Tobacco comments:    quit for 2 years after sinus infection and 6 months after heart surgery  Vaping Use   Vaping Use: Never used  Substance and Sexual Activity   Alcohol use: No   Drug use: No   Sexual activity: Not Currently  Other Topics Concern   Not on file  Social History Narrative   Widowed   Social Determinants of Health   Financial Resource Strain: Low Risk    Difficulty of Paying Living Expenses: Not hard at all  Food Insecurity: No Food Insecurity   Worried About Charity fundraiser in the Last Year: Never true   Arboriculturist in the Last Year: Never true  Transportation Needs: No Transportation Needs   Lack of Transportation (Medical): No   Lack of Transportation (Non-Medical): No  Physical Activity: Inactive   Days of Exercise per Week: 0 days   Minutes of Exercise per Session: 0 min  Stress:  Stress Concern Present   Feeling of Stress : Very much  Social Connections: Moderately Isolated   Frequency of Communication  with Friends and Family: More than three times a week   Frequency of Social Gatherings with Friends and Family: More than three times a week   Attends Religious Services: 1 to 4 times per year   Active Member of [employer] or Organizations: No   Attends Archivist Meetings: Never   Marital Status: Widowed  Human resources officer Violence: Not At Risk   Fear of Current or Ex-Partner: No   Emotionally Abused: No   Physically Abused: No   Sexually Abused: No    Outpatient Medications Prior to Visit  Medication Sig Dispense Refill   amLODipine (NORVASC) 5 MG tablet Take 5 mg by mouth daily.     aspirin 81 MG tablet Take 81 mg by mouth daily.     cholecalciferol (VITAMIN D) 1000 UNITS tablet Take 1,000 Units by mouth daily.     clonazePAM (KLONOPIN) 0.5 MG tablet Take 0.5 mg by mouth.     conjugated estrogens (PREMARIN) vaginal cream Place vaginally as needed.     cyanocobalamin 1000 MCG tablet Take by mouth.     Multiple Vitamins-Minerals (PRESERVISION AREDS 2 PO) Take by mouth 2 (two) times daily.     nitroGLYCERIN (NITROSTAT) 0.4 MG SL tablet Place 1 tablet (0.4 mg total) under the tongue every 5 (five) minutes as needed for chest pain. 50 tablet 3   omeprazole (PRILOSEC) 20 MG capsule TAKE 1 CAPSULE(20 MG) BY MOUTH DAILY 90 capsule 1   rosuvastatin (CRESTOR) 40 MG tablet Take 1 tablet (40 mg total) by mouth daily. 90 tablet 0   Turmeric (QC TUMERIC COMPLEX PO) Take 1 mg by mouth daily.     metoprolol succinate (TOPROL-XL) 100 MG 24 hr tablet Take 100 mg by mouth daily.     isosorbide mononitrate (IMDUR) 60 MG 24 hr tablet Take 1 tablet (60 mg total) by mouth daily. 30 tablet 0   No facility-administered medications prior to visit.    Allergies  Allergen Reactions   Telmisartan Other (See Comments)    Acute renal failure Renal issues   Niacin And Related  Hives   Sertraline Hcl Other (See Comments)    Hallucinations,     Sulfa Drugs Cross Reactors Nausea Only   Tetracyclines & Related Hives   Tizanidine Other (See Comments)    Issues with kidney function   Latex Rash    RAST testing was NEGATIVE  for LATEX   Simvastatin Rash    *Antihyperlipidemics*; elevated LFT's.    Vitals with BMI 12/05/2021 10/04/2021 09/07/2021  Height 5\' 2"  5\' 4"  5\' 2"   Weight 152 lbs 2 oz 152 lbs 10 oz 154 lbs  BMI 27.82 19.50 93.26  Systolic 712 - -  Diastolic 64 - -  Pulse 62 - -    Orthostatic VS for the past 72 hrs (Last 3 readings):  Orthostatic BP Patient Position BP Location Cuff Size Orthostatic Pulse  12/05/21 1057 115/60 Standing Left Arm Normal 55  12/05/21 1056 102/54 Sitting Left Arm Normal 54  12/05/21 1053 120/68 Supine Left Arm Normal 55    Review of Systems  Constitutional:  Positive for fatigue. Negative for activity change, appetite change, chills, diaphoresis, fever and unexpected weight change.  HENT: Negative.    Respiratory: Negative.    Cardiovascular:  Positive for chest pain (occasional chest pain since Myocardial Infarction, no active chest pain today.).  Gastrointestinal: Negative.   Genitourinary: Negative.   Musculoskeletal: Negative.   Skin: Negative.   Neurological: Negative.   Psychiatric/Behavioral: Negative.  Objective:    Physical Exam  General: Appearance:     Overweight female in no acute distress  Eyes:    PERRL, conjunctiva/corneas clear, EOM's intact       Lungs:     Clear to auscultation bilaterally, respirations unlabored  Heart:    Normal heart rate. Normal rhythm. No murmurs, rubs, or gallops.    MS:   All extremities are intact.    Neurologic:   Awake, alert, oriented x 3. No apparent focal neurological           defect.     BP 120/64    Pulse 62    Temp 97.6 F (36.4 C) (Oral)    Resp 16    Ht 5\' 2"  (1.575 m)    Wt 152 lb 2 oz (69 kg)    SpO2 98%    BMI 27.82 kg/m  Wt Readings from Last  3 Encounters:  12/05/21 152 lb 2 oz (69 kg)  10/04/21 152 lb 9.6 oz (69.2 kg)  09/07/21 154 lb (69.9 kg)    Health Maintenance Due  Topic Date Due   Zoster Vaccines- Shingrix (1 of 2) Never done   COVID-19 Vaccine (4 - Booster for Pfizer series) 10/01/2020    There are no preventive care reminders to display for this patient.      Assessment & Plan:   Problem List Items Addressed This Visit       Cardiovascular and Mediastinum   CAD (coronary artery disease), autologous vein bypass graft - Primary   Relevant Medications   metoprolol succinate (TOPROL-XL) 50 MG 24 hr tablet   Other Relevant Orders   Ambulatory referral to Cardiology   EKG 12-Lead (Completed)   CBC with Differential/Platelet (Completed)   Comprehensive metabolic panel (Completed)   TSH (Completed)   Magnesium (Completed)   Calcium   DG Chest 2 View (Completed)   Coronary artery disease involving native coronary artery of native heart    Decreased metoprolol XL from 100 mg once daily to 75 mg once daily ( 50mg  tablet 1.5 tablets. ) to equal 75 mg once daily. To find a tolerable dose due to symptomatic bradycardia and fatigue.  Red Flags discussed. The patient was given clear instructions to go to ER or return to medical center if any red flags develop, symptoms do not improve, worsen or new problems develop. They verbalized understanding.        Relevant Medications   metoprolol succinate (TOPROL-XL) 50 MG 24 hr tablet   Orthostatic hypotension    Increase hydration and dose change on metoprolol.       Relevant Medications   metoprolol succinate (TOPROL-XL) 50 MG 24 hr tablet   Other Relevant Orders   CBC with Differential/Platelet (Completed)   Comprehensive metabolic panel (Completed)   TSH (Completed)   Magnesium (Completed)   Calcium     Other   Bradycardia    Decreased metoprolol XL from 100 mg once daily to 75 mg once daily ( 50mg  tablet 1.5 tablets. ) to equal 75 mg once daily. To find a  tolerable dose due to symptomatic bradycardia and fatigue.  Red Flags discussed. The patient was given clear instructions to go to ER or return to medical center if any red flags develop, symptoms do not improve, worsen or new problems develop. They verbalized understanding.       Relevant Medications   metoprolol succinate (TOPROL-XL) 50 MG 24 hr tablet   Other Relevant Orders   CBC with  Differential/Platelet (Completed)   Comprehensive metabolic panel (Completed)   TSH (Completed)   Magnesium (Completed)   Calcium   DG Chest 2 View (Completed)   Fatigue   Relevant Medications   metoprolol succinate (TOPROL-XL) 50 MG 24 hr tablet   Other Relevant Orders   Ambulatory referral to Cardiology   EKG 12-Lead (Completed)   CBC with Differential/Platelet (Completed)   Comprehensive metabolic panel (Completed)   TSH (Completed)   Magnesium (Completed)   Calcium   DG Chest 2 View (Completed)   Other Visit Diagnoses     Non-ST elevation MI (NSTEMI) ([place])- 06/2021       Relevant Medications   metoprolol succinate (TOPROL-XL) 50 MG 24 hr tablet        Meds ordered this encounter   metoprolol succinate (TOPROL-XL) 50 MG 24 hr tablet    Sig: Take 1.5 tablets (75 mg total) by mouth daily. Take with or immediately following a meal.    Dispense:  90 tablet    Refill:  1    Discussed with supervising MD, and given her bradycardia we will titrate down metoprolol succinate from 100 mg to 75 mg as above once daily. Continue to keep log of blood pressure and heart rate.  Orthostatic hypotension mild, continue to hydrate.   Medications Discontinued During This Encounter  Medication Reason   metoprolol succinate (TOPROL-XL) 100 MG 24 hr tablet Completed Course   metoprolol succinate (TOPROL-XL) 50 MG 24 hr tablet    Referral placed at patients request.  Orders Placed This Encounter  Procedures   DG Chest 2 View    Order Specific Question:   Reason for Exam (SYMPTOM  OR DIAGNOSIS  REQUIRED)    Answer:   history of MI, pain with inspiration in cold air.    Order Specific Question:   Preferred imaging location?    Answer:   [place]   CBC with Differential/Platelet   Comprehensive metabolic panel   TSH   Magnesium   Calcium   Ambulatory referral to Cardiology    Referral Priority:   Urgent    Referral Type:   Consultation    Referral Reason:   Specialty Services Required    Referred to Provider:   Minna Merritts, MD    Requested Specialty:   Cardiology    Number of Visits Requested:   1   EKG 12-Lead     Red Flags discussed. The patient was given clear instructions to go to ER or return to medical center if any red flags develop, symptoms do not improve, worsen or new problems develop. They verbalized understanding.  Return in about 2 weeks (around 12/19/2021), or if symptoms worsen or fail to improve, for at any time for any worsening symptoms, Go to Emergency room/ urgent care if worse.   Marcille Buffy, FNP

## 2021-12-05 NOTE — Assessment & Plan Note (Signed)
Decreased metoprolol XL from 100 mg once daily to 75 mg once daily ( 50mg  tablet 1.5 tablets. ) to equal 75 mg once daily. To find a tolerable dose due to symptomatic bradycardia and fatigue.  Red Flags discussed. The patient was given clear instructions to go to ER or return to medical center if any red flags develop, symptoms do not improve, worsen or new problems develop. They verbalized understanding.

## 2021-12-05 NOTE — Chronic Care Management (AMB) (Signed)
Chronic Care Management CCM Pharmacy Note  12/05/2021 Name:  Sabrina Montoya MRN:  [id number] DOB:  [birth year]  Summary: - Bradycardia at home. Fatigue. Seen with APP today  Recommendations/Changes made from today's visit: - Decrease metoprolol succinate to 75 mg daily. Follow vitals at home. Referral in place to [facility] Cardiology - Reviewed vaccination recommendations  Subjective: Sabrina Montoya is an 81 y.o. year old female who is a primary patient of Derrel Nip, Aris Everts, MD.  The CCM team was consulted for assistance with disease management and care coordination needs.    Engaged with patient face to face for follow up visit for pharmacy case management and/or care coordination services.   Objective:  Medications Reviewed Today     Reviewed by Francis Gaines, LCSW (Social Worker) on 11/30/21 at [place] List Status: <None>   Medication Order Taking? Sig Documenting Provider Last Dose Status Informant  amLODipine (NORVASC) 5 MG tablet [id number] No Take by mouth. [provider] Taking Active   amoxicillin-clavulanate (AUGMENTIN) 875-125 MG tablet [id number]  Take 1 tablet by mouth 2 (two) times daily. Crecencio Mc, MD  Active   aspirin 81 MG tablet [id number] No Take 81 mg by mouth daily. [provider] Taking Active Self  cholecalciferol (VITAMIN D) 1000 UNITS tablet [id number] No Take 1,000 Units by mouth daily. [provider] Taking Active Self  clonazePAM (KLONOPIN) 0.5 MG tablet [id number] No Take by mouth. [provider] Taking Active   conjugated estrogens (PREMARIN) vaginal cream [id number] No Place vaginally as needed.  Patient not taking: No sig reported   [provider] Not Taking Active   cyanocobalamin 1000 MCG tablet [id number] No Take by mouth. [provider] Taking Active   isosorbide mononitrate (IMDUR) 60 MG 24 hr tablet [id number] No Take 1 tablet (60 mg total) by mouth daily. Nolberto Hanlon, MD Taking Expired 09/07/21  2359   metoprolol succinate (TOPROL-XL) 100 MG 24 hr tablet [id number] No Take by mouth. [provider] Taking Active   Multiple Vitamins-Minerals (PRESERVISION AREDS 2 PO) [id number] No Take by mouth 2 (two) times daily. [provider] Taking Active Self  nitroGLYCERIN (NITROSTAT) 0.4 MG SL tablet [id number] No Place 1 tablet (0.4 mg total) under the tongue every 5 (five) minutes as needed for chest pain. Crecencio Mc, MD Taking Active   omeprazole (PRILOSEC) 20 MG capsule [id number]  TAKE 1 CAPSULE(20 MG) BY MOUTH DAILY Crecencio Mc, MD  Active   predniSONE (DELTASONE) 10 MG tablet [id number]  6 tablets on Day 1 , then reduce by 1 tablet daily until gone Crecencio Mc, MD  Active   rosuvastatin (CRESTOR) 40 MG tablet [id number] No Take 1 tablet (40 mg total) by mouth daily. Crecencio Mc, MD Taking Active Self  Turmeric (QC TUMERIC COMPLEX PO) [id number] No Take 1 mg by mouth daily. [provider] Taking Active Self  Med List Note Leeanne Rio, [place] 11/13/17 1740): Please notify lab that pt needs a butterfly due to blood flow issues. Pt asked that I make a note in her chart so that lab tech dont get upset everytime she mentions it to them.            Pertinent Labs:   Lab Results  Component Value Date   HGBA1C 7.0 (H) 06/06/2021   Lab Results  Component Value Date   CHOL 116 06/06/2021   HDL 44 06/06/2021   LDLCALC 48 06/06/2021   LDLDIRECT 48.0 04/18/2018  TRIG 122 06/06/2021   CHOLHDL 2.6 06/06/2021   Lab Results  Component Value Date   CREATININE 1.36 (H) 08/18/2021   BUN 32 (H) 08/18/2021   NA 142 08/18/2021   K 5.0 08/18/2021   CL 107 08/18/2021   CO2 27 08/18/2021    SDOH:  (Social Determinants of Health) assessments and interventions performed:  SDOH Interventions    Flowsheet Row Most Recent Value  SDOH Interventions   Financial Strain Interventions Intervention Not Indicated       CCM Care Plan  Review of patient  past medical history, allergies, medications, health status, including review of consultants reports, laboratory and other test data, was performed as part of comprehensive evaluation and provision of chronic care management services.   Care Plan : Medication Management  Updates made by De Hollingshead, RPH-CPP since 12/05/2021 12:00 AM     Problem: Diabetes, CKD, HTN, CAD, PVD      Long-Range Goal: Disease Progression Prevention   Start Date: 05/12/2021  Recent Progress: On track  Priority: High  Note:   Current Barriers:  Suboptimal therapeutic regimen for diabetes, CKD , CAD  Pharmacist Clinical Goal(s):  Over the next 90 days, patient will achieve control of diabetes as evidenced by A1c  through collaboration with PharmD and provider.   Interventions: 1:1 collaboration with Crecencio Mc, MD regarding development and update of comprehensive plan of care as evidenced by provider attestation and co-signature Inter-disciplinary care team collaboration (see longitudinal plan of care) Comprehensive medication review performed; medication list updated in electronic medical record  Health Maintenance Yearly diabetic eye exam: up to date Yearly diabetic foot exam: up to date Urine microalbumin: up to date Yearly influenza vaccination: up to date Td/Tdap vaccination: up to date Pneumonia vaccination: up to date COVID vaccinations: due for booster, but defer at least 3 months from [place] test Shingrix vaccinations: due - encouraged today Due for PCP follow up. Scheduled for appointment and fasting labs  Diabetes in the setting of CKD, CAD Right at goal of A1c <7%; current treatment: none  Current glucose readings: not checking Previously discussed SGLT2. Consider discussion moving forward after evaluation/resolution of fatigue  Hypertension (CAD, s/p CABG 2006, PVD) in the setting of CKD Controlled; current treatment: isosorbide 60 mg daily, amlodipine 5 mg daily, metoprolol  succinate 100 mg daily; follows w/ [facility] cardiology  Acute rise in Scr (38%) with telmisartan initiation. Avoiding ARB. BP and HR controlled today in clinic, but reports readings at home w/ significant ranges in SBP/DBP and HR. EKG and orthostatics done today in APP visit.  Reduce metoprolol succinate to 75 mg daily. Continue amlodipine 5 mg QPM, isosorbide 60 mg QAM. Follow up in ~ 4 weeks as schedule.d  Requests referral to Dr. Rockey Situ. APP placed order today.   Hyperlipidemia, secondary ASCVD prevention: Lipids controlled at goal <70; current treatment: rosuvastatin 40 mg daily;  Medications previously tried: niacin, simvastatin Antiplatelet regimen: aspirin 81 mg daily Recommended to continue current regimen at this time. Patient concerned about CV health. Reviewed benefit of antiplatelet and statin agents, importance of medication management  GERD/hx esophagitis: Appropriately managed; current regimen: omeprazole 20 mg daily  PPI therapy appropriate given hx esophagitis. Previously recommended to continue current regimen at this time  Supplements: Vitamin D, Vitamin B12, Preservision AREDS 2, turmeric   Patient Goals/Self-Care Activities Over the next 90 days, patient will:  - take medications as prescribed check blood pressure periodically, document, and provide at future appointments  Plan: Telephone follow up appointment with care management team member scheduled for:  4 weeks  Catie Darnelle Maffucci, PharmD, [place], [place] Clinical Pharmacist [employer] at [employer] [phone]

## 2021-12-05 NOTE — Telephone Encounter (Signed)
Left message to return call.  Patient seen by NP Flinchum 12/05/21. Patient also saw pharmacist Catie and had some blood work done.   Patient was informed to be seen in 2 weeks with Dr Derrel Nip for follow up. Patient was scheduled for 12/19/21. This is correct. There are orders placed for fasting labs. Patient does need to have this done at minimum 3 days before her 12/19/21 appointment.   Patient can keep appointment with Dr Derrel Nip for 12/19/21, that is when the provider wanted her to be seen.  Please schedule Patient a fasting lab appointment if calling back in.

## 2021-12-05 NOTE — Telephone Encounter (Signed)
Pt saw Np Flinchum today. Np Flinchum would like for the Pt to follow up in Two weeks. No avail appt with Dr. Derrel Nip until 12/27/2021 at 4:30pm. Pt refuse to take that date. Pt kept talking about lab orders. Pt requesting call for appt time and date.

## 2021-12-06 ENCOUNTER — Other Ambulatory Visit: Payer: Self-pay | Admitting: Adult Health

## 2021-12-06 ENCOUNTER — Telehealth: Payer: Self-pay

## 2021-12-06 DIAGNOSIS — I214 Non-ST elevation (NSTEMI) myocardial infarction: Secondary | ICD-10-CM | POA: Diagnosis not present

## 2021-12-06 DIAGNOSIS — R5383 Other fatigue: Secondary | ICD-10-CM

## 2021-12-06 DIAGNOSIS — E7800 Pure hypercholesterolemia, unspecified: Secondary | ICD-10-CM | POA: Diagnosis not present

## 2021-12-06 DIAGNOSIS — I25810 Atherosclerosis of coronary artery bypass graft(s) without angina pectoris: Secondary | ICD-10-CM

## 2021-12-06 DIAGNOSIS — E1140 Type 2 diabetes mellitus with diabetic neuropathy, unspecified: Secondary | ICD-10-CM | POA: Diagnosis not present

## 2021-12-06 DIAGNOSIS — D631 Anemia in chronic kidney disease: Secondary | ICD-10-CM

## 2021-12-06 DIAGNOSIS — R9389 Abnormal findings on diagnostic imaging of other specified body structures: Secondary | ICD-10-CM

## 2021-12-06 DIAGNOSIS — N1831 Chronic kidney disease, stage 3a: Secondary | ICD-10-CM

## 2021-12-06 DIAGNOSIS — I2510 Atherosclerotic heart disease of native coronary artery without angina pectoris: Secondary | ICD-10-CM | POA: Diagnosis not present

## 2021-12-06 DIAGNOSIS — Z8616 Personal history of COVID-19: Secondary | ICD-10-CM

## 2021-12-06 DIAGNOSIS — N189 Chronic kidney disease, unspecified: Secondary | ICD-10-CM

## 2021-12-06 DIAGNOSIS — R0600 Dyspnea, unspecified: Secondary | ICD-10-CM

## 2021-12-06 DIAGNOSIS — G459 Transient cerebral ischemic attack, unspecified: Secondary | ICD-10-CM | POA: Diagnosis not present

## 2021-12-06 DIAGNOSIS — R6889 Other general symptoms and signs: Secondary | ICD-10-CM

## 2021-12-06 DIAGNOSIS — J439 Emphysema, unspecified: Secondary | ICD-10-CM

## 2021-12-06 DIAGNOSIS — R944 Abnormal results of kidney function studies: Secondary | ICD-10-CM

## 2021-12-06 DIAGNOSIS — R079 Chest pain, unspecified: Secondary | ICD-10-CM

## 2021-12-06 DIAGNOSIS — Z87891 Personal history of nicotine dependence: Secondary | ICD-10-CM | POA: Diagnosis not present

## 2021-12-06 NOTE — Progress Notes (Signed)
Orders Placed This Encounter  Procedures   Ambulatory referral to Pulmonology    Referral Priority:   Urgent    Referral Type:   Consultation    Referral Reason:   Specialty Services Required    Requested Specialty:   Pulmonary Disease    Number of Visits Requested:   1   The primary encounter diagnosis was Pulmonary emphysema, unspecified emphysema type ([place]). Diagnoses of Abnormal chest x-ray, Fatigue, unspecified type, and History of COVID-19 were also pertinent to this visit.

## 2021-12-06 NOTE — Progress Notes (Signed)
Anemia still present, verify any bleeding ?  Likely due to chronic kidney disease. I do see where Dr. Derrel Nip ordered a [place] on renal arteries, I do not see where this has been done ?  Please order iron, tibc, ferritin and b12/ folate to her labs for anemia she will need to schedule these.  Kidney function is stable slightly improved, avoid NSAID such as aleve, motrin, ibuprofen and advil. Stay hydrated.  Given her persistent shortness of breath and intermittent chest pain, I have ordered as CT angiogram to rule out blood clot in chest, they will call to schedule.  ER if any worsening symptoms at anytime.  Keep follow up with Dr. Derrel Nip on 12/19/21 to discuss.

## 2021-12-06 NOTE — Telephone Encounter (Signed)
Lvm for pt to return call in regards to CXR.

## 2021-12-06 NOTE — Progress Notes (Signed)
Daily Session Note  Patient Details  Name: Sabrina Montoya MRN: [id number] Date of Birth: 31-Oct-1941 Referring Provider:   Flowsheet Row Cardiac Rehab from 10/04/2021 in [facility] Cardiac and Pulmonary Rehab  Referring Provider Harle Stanford MD       Encounter Date: 12/06/2021  Check In:  Session Check In - 12/06/21 1056       Check-In   Supervising physician immediately available to respond to emergencies See telemetry face sheet for immediately available ER MD    Location [facility]-Cardiac & Pulmonary Rehab    Staff Present Birdie Sons, MPA, RN;Amanda Sommer, BA, ACSM CEP, Exercise Physiologist;Melissa Tilford Pillar, RDN, LDN    Virtual Visit No    Medication changes reported     Yes    Comments 130m dose of metoprolol reduced to 731mdue to complaints to her PCP of fatique and chest discomfort    Fall or balance concerns reported    No    Tobacco Cessation No Change    Warm-up and Cool-down Performed on first and last piece of equipment    Resistance Training Performed Yes    VAD Patient? No    PAD/SET Patient? No      Pain Assessment   Currently in Pain? No/denies                Social History   Tobacco Use  Smoking Status Former   Packs/day: 0.50   Years: 30.00   Pack years: 15.00   Types: Cigarettes   Quit date: 04/01/2012   Years since quitting: 9.6  Smokeless Tobacco Never  Tobacco Comments   quit for 2 years after sinus infection and 6 months after heart surgery    Goals Met:  Independence with exercise equipment Exercise tolerated well No report of concerns or symptoms today Strength training completed today  Goals Unmet:  Not Applicable  Comments: Pt able to follow exercise prescription today, but experienced some chest discomfort that she has been experiencing periodically. She stated she went to her PCP yesterday and her metoprolol dose was decreased from 10081mo 6m61mily. She was encouraged to see her cardiologist and Melissa, RD, Cow[place]orted her to his  office to schedule an appointment and discuss her concerns. Will continue to monitor for progression.    Dr. MarkEmily FilbertMedical Director for [facility]. FuadOttie GlazierMedical Director for [facility].

## 2021-12-06 NOTE — Telephone Encounter (Signed)
Pt returning call

## 2021-12-07 ENCOUNTER — Ambulatory Visit
Admission: RE | Admit: 2021-12-07 | Discharge: 2021-12-07 | Disposition: A | Discharge status: Home / Self Care | Payer: Medicare Other | Source: Ambulatory Visit | Attending: Adult Health | Admitting: Adult Health

## 2021-12-07 ENCOUNTER — Other Ambulatory Visit: Payer: Self-pay | Admitting: Adult Health

## 2021-12-07 ENCOUNTER — Other Ambulatory Visit: Payer: Self-pay

## 2021-12-07 DIAGNOSIS — R5383 Other fatigue: Secondary | ICD-10-CM | POA: Diagnosis not present

## 2021-12-07 DIAGNOSIS — I214 Non-ST elevation (NSTEMI) myocardial infarction: Secondary | ICD-10-CM | POA: Diagnosis not present

## 2021-12-07 DIAGNOSIS — R0600 Dyspnea, unspecified: Secondary | ICD-10-CM | POA: Diagnosis not present

## 2021-12-07 DIAGNOSIS — N1831 Chronic kidney disease, stage 3a: Secondary | ICD-10-CM

## 2021-12-07 DIAGNOSIS — R944 Abnormal results of kidney function studies: Secondary | ICD-10-CM | POA: Insufficient documentation

## 2021-12-07 DIAGNOSIS — R079 Chest pain, unspecified: Secondary | ICD-10-CM | POA: Diagnosis not present

## 2021-12-07 DIAGNOSIS — Z8616 Personal history of COVID-19: Secondary | ICD-10-CM | POA: Diagnosis not present

## 2021-12-07 DIAGNOSIS — I25810 Atherosclerosis of coronary artery bypass graft(s) without angina pectoris: Secondary | ICD-10-CM | POA: Insufficient documentation

## 2021-12-07 DIAGNOSIS — R0789 Other chest pain: Secondary | ICD-10-CM | POA: Diagnosis not present

## 2021-12-07 DIAGNOSIS — R0602 Shortness of breath: Secondary | ICD-10-CM | POA: Diagnosis not present

## 2021-12-07 DIAGNOSIS — R9389 Abnormal findings on diagnostic imaging of other specified body structures: Secondary | ICD-10-CM | POA: Diagnosis not present

## 2021-12-07 MED ORDER — IOHEXOL 350 MG/ML SOLN
60.0000 mL | Freq: Once | INTRAVENOUS | Status: AC | PRN
  Administered 2021-12-07: 60 mL via INTRAVENOUS

## 2021-12-07 MED ORDER — METOPROLOL SUCCINATE ER 50 MG PO TB24: 75.0000 mg | ORAL_TABLET | Freq: Every day | ORAL | 1 refills | Status: DC

## 2021-12-07 NOTE — Telephone Encounter (Signed)
Please see messages Thank you

## 2021-12-07 NOTE — Telephone Encounter (Signed)
Patient called in regards to metoprolol medication. Patient stated the pharmacy does not carry 75mg  tablets only 50mg . Patient has to take a 50mg  and half of a 50mg . Patient is needing a new prescription sent in for her medication for a 90 day supply because it is cheaper for patient. The patient only has enough for 50 days.

## 2021-12-07 NOTE — Progress Notes (Signed)
Orders Placed This Encounter  Procedures   US Renal Artery Stenosis    Order Specific Question:   Reason for Exam (SYMPTOM  OR DIAGNOSIS REQUIRED)    Answer:   PAD, decrease GFR with ARB,    Order Specific Question:   Preferred imaging location?    Answer:   [facility]

## 2021-12-07 NOTE — Progress Notes (Signed)
Noted. Thanks a new Renal Ultrasound has been ordered.  CT angiogram has been resulted.

## 2021-12-07 NOTE — Telephone Encounter (Signed)
Spoke with pt and she stated that the quantity on the rx that was sent was incorrect. Rx has been resent with correct quantity. Pt is aware.

## 2021-12-07 NOTE — Progress Notes (Signed)
No evidence of blood clot.  No acute infection.  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  Mild cardiomegaly( enlargement of heart) ok with her history and this is mild. Aorta measures 3 cm, ok and will continue to monitor.   Keep cardiology visit and follow up visit with Dr. Derrel Nip.   She also sees Dr. Lucky Cowboy at [facility] vein and vascular for this. Echo was 6 month ago and reviewed

## 2021-12-08 ENCOUNTER — Encounter: Payer: Medicare Other | Attending: Internal Medicine

## 2021-12-08 DIAGNOSIS — I214 Non-ST elevation (NSTEMI) myocardial infarction: Secondary | ICD-10-CM | POA: Insufficient documentation

## 2021-12-08 NOTE — Progress Notes (Signed)
Daily Session Note  Patient Details  Name: Sabrina Montoya MRN: [id number] Date of Birth: 12-19-1940 Referring Provider:   Flowsheet Row Cardiac Rehab from 10/04/2021 in [facility] Cardiac and Pulmonary Rehab  Referring Provider Harle Stanford MD       Encounter Date: 12/08/2021  Check In:  Session Check In - 12/08/21 1041       Check-In   Supervising physician immediately available to respond to emergencies See telemetry face sheet for immediately available ER MD    Location [facility]-Cardiac & Pulmonary Rehab    Staff Present Birdie Sons, MPA, RN;Melissa [place], RDN, Tawanna Solo, MS, ASCM CEP, Exercise Physiologist;Amanda Oletta Darter, BA, ACSM CEP, Exercise Physiologist    Virtual Visit No    Medication changes reported     No    Fall or balance concerns reported    No    Tobacco Cessation No Change    Warm-up and Cool-down Performed on first and last piece of equipment    Resistance Training Performed Yes    VAD Patient? No    PAD/SET Patient? No      Pain Assessment   Currently in Pain? No/denies                Social History   Tobacco Use  Smoking Status Former   Packs/day: 0.50   Years: 30.00   Pack years: 15.00   Types: Cigarettes   Quit date: 04/01/2012   Years since quitting: 9.6  Smokeless Tobacco Never  Tobacco Comments   quit for 2 years after sinus infection and 6 months after heart surgery    Goals Met:  Independence with exercise equipment Exercise tolerated well No report of concerns or symptoms today Strength training completed today  Goals Unmet:  Not Applicable  Comments: Pt able to follow exercise prescription today without complaint.  Will continue to monitor for progression.    Dr. Emily Filbert is Medical Director for [place].  Dr. Ottie Glazier is Medical Director for [facility] Pulmonary Rehabilitation.

## 2021-12-08 NOTE — Telephone Encounter (Signed)
See telephone encounter.

## 2021-12-13 ENCOUNTER — Other Ambulatory Visit: Payer: Self-pay

## 2021-12-13 ENCOUNTER — Other Ambulatory Visit (INDEPENDENT_AMBULATORY_CARE_PROVIDER_SITE_OTHER): Payer: Medicare Other

## 2021-12-13 DIAGNOSIS — E1140 Type 2 diabetes mellitus with diabetic neuropathy, unspecified: Secondary | ICD-10-CM | POA: Diagnosis not present

## 2021-12-13 DIAGNOSIS — N189 Chronic kidney disease, unspecified: Secondary | ICD-10-CM | POA: Diagnosis not present

## 2021-12-13 DIAGNOSIS — I25810 Atherosclerosis of coronary artery bypass graft(s) without angina pectoris: Secondary | ICD-10-CM | POA: Diagnosis not present

## 2021-12-13 DIAGNOSIS — D631 Anemia in chronic kidney disease: Secondary | ICD-10-CM

## 2021-12-13 DIAGNOSIS — R6889 Other general symptoms and signs: Secondary | ICD-10-CM | POA: Diagnosis not present

## 2021-12-13 DIAGNOSIS — I214 Non-ST elevation (NSTEMI) myocardial infarction: Secondary | ICD-10-CM

## 2021-12-13 LAB — IBC + FERRITIN
Ferritin: 81 ng/mL (ref 10.0–291.0)
Iron: 77 ug/dL (ref 42–145)
Saturation Ratios: 24.7 % (ref 20.0–50.0)
TIBC: 312.2 ug/dL (ref 250.0–450.0)
Transferrin: 223 mg/dL (ref 212.0–360.0)

## 2021-12-13 LAB — COMPREHENSIVE METABOLIC PANEL
ALT: 14 U/L (ref 0–35)
AST: 31 U/L (ref 0–37)
Albumin: 4.5 g/dL (ref 3.5–5.2)
Alkaline Phosphatase: 60 U/L (ref 39–117)
BUN: 27 mg/dL — ABNORMAL HIGH (ref 6–23)
CO2: 30 mEq/L (ref 19–32)
Calcium: 9.8 mg/dL (ref 8.4–10.5)
Chloride: 105 mEq/L (ref 96–112)
Creatinine, Ser: 1.11 mg/dL (ref 0.40–1.20)
GFR: 47.26 mL/min — ABNORMAL LOW (ref >60.00)
Glucose, Bld: 123 mg/dL — ABNORMAL HIGH (ref 70–99)
Potassium: 5.1 mEq/L (ref 3.5–5.1)
Sodium: 141 mEq/L (ref 135–145)
Total Bilirubin: 0.4 mg/dL (ref 0.2–1.2)
Total Protein: 6.8 g/dL (ref 6.0–8.3)

## 2021-12-13 LAB — B12 AND FOLATE PANEL
Folate: >24.2 ng/mL (ref >5.9)
Vitamin B-12: >1504 pg/mL — ABNORMAL HIGH (ref 211–911)

## 2021-12-13 LAB — LIPID PANEL
Cholesterol: 114 mg/dL (ref 0–200)
HDL: 36.2 mg/dL — ABNORMAL LOW (ref >39.00)
LDL Cholesterol: 40 mg/dL (ref 0–99)
NonHDL: 78.05
Total CHOL/HDL Ratio: 3
Triglycerides: 191 mg/dL — ABNORMAL HIGH (ref 0.0–149.0)
VLDL: 38.2 mg/dL (ref 0.0–40.0)

## 2021-12-13 LAB — HEMOGLOBIN A1C: Hgb A1c MFr Bld: 7 % — ABNORMAL HIGH (ref 4.6–6.5)

## 2021-12-13 NOTE — Progress Notes (Signed)
Daily Session Note  Patient Details  Name: JALEEYA MCNELLY MRN: [id number] Date of Birth: Aug 14, 1941 Referring Provider:   Flowsheet Row Cardiac Rehab from 10/04/2021 in [facility] Cardiac and Pulmonary Rehab  Referring Provider Harle Stanford MD       Encounter Date: 12/13/2021  Check In:  Session Check In - 12/13/21 1103       Check-In   Supervising physician immediately available to respond to emergencies See telemetry face sheet for immediately available ER MD    Location [facility]-Cardiac & Pulmonary Rehab    Staff Present Birdie Sons, MPA, RN;Jessica [place], MA, RCEP, CCRP, CCET;Amanda Sommer, BA, ACSM CEP, Exercise Physiologist;Melissa Caiola, RDN, LDN    Virtual Visit No    Medication changes reported     No    Fall or balance concerns reported    No    Tobacco Cessation No Change    Warm-up and Cool-down Performed on first and last piece of equipment    Resistance Training Performed Yes    VAD Patient? No    PAD/SET Patient? No      Pain Assessment   Currently in Pain? No/denies                Social History   Tobacco Use  Smoking Status Former   Packs/day: 0.50   Years: 30.00   Pack years: 15.00   Types: Cigarettes   Quit date: 04/01/2012   Years since quitting: 9.7  Smokeless Tobacco Never  Tobacco Comments   quit for 2 years after sinus infection and 6 months after heart surgery    Goals Met:  Independence with exercise equipment Exercise tolerated well Personal goals reviewed No report of concerns or symptoms today Strength training completed today  Goals Unmet:  Not Applicable  Comments: Pt able to follow exercise prescription today without complaint.  Will continue to monitor for progression.    Dr. Emily Filbert is Medical Director for [place].  Dr. Ottie Glazier is Medical Director for [facility] Pulmonary Rehabilitation.

## 2021-12-13 NOTE — Progress Notes (Signed)
Has follow up with Crecencio Mc, MD on 12/19/21 for follow up.  B12 elevated, decrease B12 dosage - verify how she is taking ?  Iron levels within normal limits.  CMP stable, glucose elevated, kidney function still decreased, improved. hemoglobin A1c is 7.0 no change in last 6 months. Triglycerides elevated likely due to uncontrolled blood sugars.Your triglycerides are elevated. Please work on avoiding concentrated sweets, and limiting white carbs (rice/bread/pasta/potatoes). Instead substitute whole grain versions with reasonable portions.   HDL good cholesterol low. LDL at goal.   Keep follow up to discuss further with Dr. Derrel Nip 12/19/21

## 2021-12-15 ENCOUNTER — Other Ambulatory Visit: Payer: Self-pay

## 2021-12-15 DIAGNOSIS — I214 Non-ST elevation (NSTEMI) myocardial infarction: Secondary | ICD-10-CM | POA: Diagnosis not present

## 2021-12-15 NOTE — Progress Notes (Signed)
Daily Session Note  Patient Details  Name: Sabrina Montoya MRN: [id number] Date of Birth: 1941-05-16 Referring Provider:   Flowsheet Row Cardiac Rehab from 10/04/2021 in [facility] Cardiac and Pulmonary Rehab  Referring Provider Harle Stanford MD       Encounter Date: 12/15/2021  Check In:  Session Check In - 12/15/21 1114       Check-In   Supervising physician immediately available to respond to emergencies See telemetry face sheet for immediately available ER MD    Location [facility]-Cardiac & Pulmonary Rehab    Staff Present Birdie Sons, MPA, RN;Meredith Sherryll Burger, RN BSN;Melissa Caiola, RDN, LDN    Virtual Visit No    Medication changes reported     No    Fall or balance concerns reported    No    Tobacco Cessation No Change    Warm-up and Cool-down Performed on first and last piece of equipment    Resistance Training Performed Yes    VAD Patient? No    PAD/SET Patient? No      Pain Assessment   Currently in Pain? No/denies                Social History   Tobacco Use  Smoking Status Former   Packs/day: 0.50   Years: 30.00   Pack years: 15.00   Types: Cigarettes   Quit date: 04/01/2012   Years since quitting: 9.7  Smokeless Tobacco Never  Tobacco Comments   quit for 2 years after sinus infection and 6 months after heart surgery    Goals Met:  Independence with exercise equipment Exercise tolerated well No report of concerns or symptoms today Strength training completed today  Goals Unmet:  Not Applicable  Comments: Pt able to follow exercise prescription today without complaint.  Will continue to monitor for progression.    Dr. Emily Filbert is Medical Director for [place].  Dr. Ottie Glazier is Medical Director for [facility] Pulmonary Rehabilitation.

## 2021-12-19 ENCOUNTER — Ambulatory Visit
Admission: RE | Admit: 2021-12-19 | Discharge: 2021-12-19 | Disposition: A | Discharge status: Home / Self Care | Payer: Medicare Other | Source: Ambulatory Visit | Attending: Adult Health | Admitting: Adult Health

## 2021-12-19 ENCOUNTER — Ambulatory Visit (INDEPENDENT_AMBULATORY_CARE_PROVIDER_SITE_OTHER): Payer: Medicare Other | Admitting: Internal Medicine

## 2021-12-19 ENCOUNTER — Encounter: Payer: Self-pay | Admitting: Internal Medicine

## 2021-12-19 ENCOUNTER — Other Ambulatory Visit: Payer: Self-pay

## 2021-12-19 DIAGNOSIS — N1831 Chronic kidney disease, stage 3a: Secondary | ICD-10-CM | POA: Insufficient documentation

## 2021-12-19 DIAGNOSIS — I7143 Infrarenal abdominal aortic aneurysm, without rupture: Secondary | ICD-10-CM

## 2021-12-19 DIAGNOSIS — I209 Angina pectoris, unspecified: Secondary | ICD-10-CM | POA: Diagnosis not present

## 2021-12-19 DIAGNOSIS — R5383 Other fatigue: Secondary | ICD-10-CM | POA: Diagnosis not present

## 2021-12-19 DIAGNOSIS — R944 Abnormal results of kidney function studies: Secondary | ICD-10-CM

## 2021-12-19 DIAGNOSIS — I714 Abdominal aortic aneurysm, without rupture, unspecified: Secondary | ICD-10-CM | POA: Diagnosis not present

## 2021-12-19 DIAGNOSIS — N281 Cyst of kidney, acquired: Secondary | ICD-10-CM | POA: Diagnosis not present

## 2021-12-19 NOTE — Assessment & Plan Note (Signed)
Reducing metoprolol dose to 50 mg dailydue to excessive fatigue and low normal BP readings/pulse on home measurement.

## 2021-12-19 NOTE — Assessment & Plan Note (Addendum)
Repeat imaging in 2023 notes infrarenal measurements of 3.6 on CT.  3.9 on renal artery doppler.  2 yr follow up imaging advised .  Continue metoprolol with dose reduced to 50 mg today due to excessive fatigue and low normal BP readings/pulse on home measurement.

## 2021-12-19 NOTE — Progress Notes (Signed)
Subjective:  Patient ID: Sabrina Montoya, female    DOB: 1940-12-01  Age: 81 y.o. 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   This visit occurred during the SARS-CoV-2 public health emergency.  Safety protocols were in place, including screening questions prior to the visit, additional usage of staff PPE, and extensive cleaning of exam room while observing appropriate contact time as indicated for disinfecting solutions.    HPI REES MATURA presents for  Chief Complaint  Patient presents with   Follow-up    Follow up on test results   1) she has been having increased fatigue,  lexertional , despte recent reduction in metoprolol to 75 mg daily .  A reviewe of her home readings indicate relative hypotension and bradycardia, today'sP 100/54 pulse 53.  Reduced metoprolol today to 50 mg  2) heart pounding and having exertional chest pain , only occurs at night after having a bathroom break in the middle of the night.  Relieved with one NTG .  Does not occur during cardiopulmonary rehab when she walks or does the bicycle.  Takes imdur 60 mg in the am.    3) having "brain fog" ever since having COVID infection in  October 2022. Marland Kitchen  Feels she never regained her strength , and losing  familiar words  in the  middle of a conversation .  The word comes to her afterwards . Wondering about supplements like prevagen   4) wet macular degeneration .  Managed by [employer] .  Avoids night driving and reading.  Recommended using Books on Tape   5) CKD:  reviewed recent Renal artery doppler,   [facility].  "No CKD"  per his opinion.  Needs letter for insurance  Outpatient Medications Prior to Visit  Medication Sig Dispense Refill   amLODipine (NORVASC) 5 MG tablet Take 5 mg by mouth daily.     aspirin 81 MG tablet Take 81 mg by mouth daily.      cholecalciferol (VITAMIN D) 1000 UNITS tablet Take 1,000 Units by mouth daily.     clonazePAM (KLONOPIN) 0.5 MG tablet Take 0.5 mg by mouth.     cyanocobalamin 1000 MCG tablet Take by mouth.     metoprolol succinate (TOPROL-XL) 50 MG 24 hr tablet Take 1.5 tablets (75 mg total) by mouth daily. Take with or immediately following a meal. 135 tablet 1   Multiple Vitamins-Minerals (PRESERVISION AREDS 2 PO) Take by mouth 2 (two) times daily.     nitroGLYCERIN (NITROSTAT) 0.4 MG SL tablet Place 1 tablet (0.4 mg total) under the tongue every 5 (five) minutes as needed for chest pain. 50 tablet 3   omeprazole (PRILOSEC) 20 MG capsule TAKE 1 CAPSULE(20 MG) BY MOUTH DAILY 90 capsule 1   rosuvastatin (CRESTOR) 40 MG tablet Take 1 tablet (40 mg total) by mouth daily. 90 tablet 0   Turmeric (QC TUMERIC COMPLEX PO) Take 1 mg by mouth daily.     isosorbide mononitrate (IMDUR) 60 MG 24 hr tablet Take 1 tablet (60 mg total) by mouth daily. 30 tablet 0   conjugated estrogens (PREMARIN) vaginal cream Place vaginally as needed. (Patient not taking: Reported on 12/19/2021)     No facility-administered medications prior to visit.    Review of Systems;  Patient denies headache, fevers, malaise, unintentional weight loss, skin rash, eye pain, sinus congestion and sinus pain, sore  throat, dysphagia,  hemoptysis , cough, dyspnea, wheezing, chest pain, palpitations, orthopnea, edema, abdominal pain, nausea, melena, diarrhea, constipation, flank pain, dysuria, hematuria, urinary  Frequency, nocturia, numbness, tingling, seizures,  Focal weakness, Loss of consciousness,  Tremor, insomnia, depression, anxiety, and suicidal ideation.      Objective:  BP (!) 112/54 (BP Location: Left Arm, Patient Position: Sitting, Cuff Size: Normal)    Pulse 62    Temp 97.6 F (36.4 C) (Oral)    Ht 5\' 2"  (1.575 m)    Wt 152 lb (68.9 kg)    SpO2 97%    BMI 27.80 kg/m   BP Readings from Last 3 Encounters:  12/19/21 (!) 112/54  12/05/21  120/64  08/29/21 111/67    Wt Readings from Last 3 Encounters:  12/19/21 152 lb (68.9 kg)  12/05/21 152 lb 2 oz (69 kg)  10/04/21 152 lb 9.6 oz (69.2 kg)    General appearance: alert, cooperative and appears stated age Ears: normal TM's and external ear canals both ears Throat: lips, mucosa, and tongue normal; teeth and gums normal Neck: no adenopathy, no carotid bruit, supple, symmetrical, trachea midline and thyroid not enlarged, symmetric, no tenderness/mass/nodules Back: symmetric, no curvature. ROM normal. No CVA tenderness. Lungs: clear to auscultation bilaterally Heart: regular rate and rhythm, S1, S2 normal, no murmur, click, rub or gallop Abdomen: soft, non-tender; bowel sounds normal; no masses,  no organomegaly Pulses: 2+ and symmetric Skin: Skin color, texture, turgor normal. No rashes or lesions Lymph nodes: Cervical, supraclavicular, and axillary nodes normal.  Lab Results  Component Value Date   HGBA1C 7.0 (H) 12/13/2021   HGBA1C 7.0 (H) 06/06/2021   HGBA1C 7.3 (H) 04/27/2021    Lab Results  Component Value Date   CREATININE 1.11 12/13/2021   CREATININE 1.30 (H) 12/05/2021   CREATININE 1.36 (H) 08/18/2021    Lab Results  Component Value Date   WBC 5.0 12/05/2021   HGB 10.5 (L) 12/05/2021   HCT 31.0 (L) 12/05/2021   PLT 194.0 12/05/2021   GLUCOSE 123 (H) 12/13/2021   CHOL 114 12/13/2021   TRIG 191.0 (H) 12/13/2021   HDL 36.20 (L) 12/13/2021   LDLDIRECT 48.0 04/18/2018   LDLCALC 40 12/13/2021   ALT 14 12/13/2021   AST 31 12/13/2021   NA 141 12/13/2021   K 5.1 12/13/2021   CL 105 12/13/2021   CREATININE 1.11 12/13/2021   BUN 27 (H) 12/13/2021   CO2 30 12/13/2021   TSH 2.69 12/05/2021   INR 1.1 06/06/2021   HGBA1C 7.0 (H) 12/13/2021   MICROALBUR 2.5 (H) 04/27/2021    US Renal Artery Stenosis  Result Date: 12/19/2021 CLINICAL DATA:  81 year old female with a history of PA D and decreasing GFR EXAM: RENAL/URINARY TRACT ULTRASOUND RENAL DUPLEX  DOPPLER ULTRASOUND COMPARISON:  CT 08/15/2017 FINDINGS: Right Kidney: Length: 10.1 cm. Echogenicity within normal limits. No hydronephrosis. Cyst on the interpolar cortex measuring 3.6 cm. Left Kidney: Length: 10.4 cm. Echogenicity within normal limits. No hydronephrosis. Anechoic cyst on the medial cortex measuring 2.6 cm. Bladder:  Unremarkable RENAL DUPLEX ULTRASOUND Right Renal Artery Velocities: Origin:  136 cm/sec Mid:  93 cm/sec Hilum:  98 cm/sec Interlobar:  73 cm/sec Arcuate:  46 cm/sec Left Renal Artery Velocities: Origin: Not visualized Mid:  60 cm/sec Hilum:  92 cm/sec Interlobar:  49 cm/sec Arcuate:  31 cm/sec Aortic Velocity:  62 cm/sec Right Renal-Aortic Ratios: Origin: 2.2 Mid:  1.5 Hilum: 1.6 Interlobar: 1.2 Arcuate: 0.7 Left Renal-Aortic Ratios: Origin: N/a Mid: 1.0 Hilum: 1.5  Interlobar: 0.8 Arcuate: 0.5 Additional: Abdominal aortic aneurysm measuring 3.9 cm, previously 3.8 cm on CT dated 08/15/2017 IMPRESSION: Directed duplex of the bilateral renal arteries demonstrates no evidence of high-grade stenosis. Redemonstration abdominal aortic aneurysm, 3.9 cm on the current duplex, previously 3.8 cm. Recommend follow-up every 2 years. This recommendation follows ACR consensus guidelines: White Paper of the ACR Incidental Findings Committee II on Vascular Findings. J Am Coll Radiol 2013; 10:789-794. Aortic aneurysm NOS (ICD10-I71.9). Signed, Dulcy Fanny. Dellia Nims, RPVI [facility] Radiology Electronically Signed   By: Corrie Mckusick D.O.   On: 12/19/2021 10:17    Assessment & Plan:   Problem List Items Addressed This Visit     AAA (abdominal aortic aneurysm) without rupture ([place])    Repeat imaging in 2023 notes infrarenal measurements of 3.6 on CT.  3.9 on renal artery doppler.  2 yr follow up imaging advised .  Continue metoprolol with dose reduced to 50 mg today due to excessive fatigue and low normal BP readings/pulse on home measurement.        Angina pectoris ([place])    She reports exertional angina at night upon returning from the long walk from her bathroom.  It does not occur during her cardiac rehab sessions.  It resolves with one SL NTG.  Advised to change administration of her Imdur to evening       Decreased GFR     Her GFR dropped during follow up BMET for telmisartan start, then  normalized after stopping telmisartan, but dropped again after her cardiac catheterization August 2 . She is not taking NSAIDs or diuretics.  Renal artery doppler done Jan 2023 was negative for high grade  stenosis  She has been referred  to nephrology at [facility] , and the opinion is that there is no real CKD .  Lab Results  Component Value Date   CREATININE 1.11 12/13/2021   Lab Results  Component Value Date   NA 141 12/13/2021   K 5.1 12/13/2021   CL 105 12/13/2021   CO2 30 12/13/2021         Fatigue    Reducing metoprolol dose to 50 mg dailydue to excessive fatigue and low normal BP readings/pulse on home measurement.         I spent 30 minutes dedicated to the care of this patient on the date of this encounter to include pre-visit review of patient's medical history,  most recent imaging studies, Face-to-face time with the patient , and post visit ordering of testing and therapeutics.    Follow-up: Return in about 3 months (around 03/18/2022).   Crecencio Mc, MD

## 2021-12-19 NOTE — Progress Notes (Signed)
Bilateral renal cysts, no renal artery stenosis noted, she does have aneurysm noted and known and shouls keep regular scheduled follow up/ screesning as previously advised with vascular Dr. Lucky Cowboy.  CC to Dr. Lucky Cowboy and PCP.

## 2021-12-19 NOTE — Assessment & Plan Note (Signed)
She reports exertional angina at night upon returning from the long walk from her bathroom.  It does not occur during her cardiac rehab sessions.  It resolves with one SL NTG.  Advised to change administration of her Imdur to evening

## 2021-12-19 NOTE — Assessment & Plan Note (Addendum)
Her GFR dropped during follow up BMET for telmisartan start, then  normalized after stopping telmisartan, but dropped again after her cardiac catheterization August 2 . She is not taking NSAIDs or diuretics.  Renal artery doppler done Jan 2023 was negative for high grade  stenosis  She has been referred  to nephrology at [facility] , and the opinion is that there is no real CKD .  Lab Results  Component Value Date   CREATININE 1.11 12/13/2021   Lab Results  Component Value Date   NA 141 12/13/2021   K 5.1 12/13/2021   CL 105 12/13/2021   CO2 30 12/13/2021

## 2021-12-19 NOTE — Progress Notes (Signed)
Renal cysts seen, no renal arterial stenosis noted. Aorta screening still recommended with vascular as previously discussed.

## 2021-12-19 NOTE — Patient Instructions (Addendum)
Reduce metoprolol even more,  down to 50 mg daily   Change imdur to night time and amlodipine to morning at current doses for now   Your memory issues are likely due to COVID so don't worry!  Try Gingko biloba supplement    The kidney disease diagnosis is being removed from your chart ,  and I will write a letter to that effect

## 2021-12-20 DIAGNOSIS — I214 Non-ST elevation (NSTEMI) myocardial infarction: Secondary | ICD-10-CM

## 2021-12-20 NOTE — Progress Notes (Signed)
Daily Session Note  Patient Details  Name: Sabrina Montoya MRN: [id number] Date of Birth: Mar 27, 1941 Referring Provider:   Flowsheet Row Cardiac Rehab from 10/04/2021 in [facility]  Referring Provider Harle Stanford MD       Encounter Date: 12/20/2021  Check In:  Session Check In - 12/20/21 1119       Check-In   Supervising physician immediately available to respond to emergencies See telemetry face sheet for immediately available ER MD    Location [facility]-Cardiac & Pulmonary Rehab    Staff Present Birdie Sons, MPA, RN;Amanda Sommer, BA, ACSM CEP, Exercise Physiologist;Jessica Vineland, MA, RCEP, CCRP, CCET    Virtual Visit No    Medication changes reported     No    Fall or balance concerns reported    No    Tobacco Cessation No Change    Warm-up and Cool-down Performed on first and last piece of equipment    Resistance Training Performed Yes    VAD Patient? No    PAD/SET Patient? No      Pain Assessment   Currently in Pain? No/denies                Social History   Tobacco Use  Smoking Status Former   Packs/day: 0.50   Years: 30.00   Pack years: 15.00   Types: Cigarettes   Quit date: 04/01/2012   Years since quitting: 9.7  Smokeless Tobacco Never  Tobacco Comments   quit for 2 years after sinus infection and 6 months after heart surgery    Goals Met:  Independence with exercise equipment Exercise tolerated well No report of concerns or symptoms today Strength training completed today  Goals Unmet:  Not Applicable  Comments: Pt able to follow exercise prescription today without complaint.  Will continue to monitor for progression.    Dr. Emily Filbert is Medical Director for [facility].  Dr. Ottie Glazier is Medical Director for [facility].

## 2021-12-28 ENCOUNTER — Encounter: Payer: Self-pay | Admitting: *Deleted

## 2021-12-28 ENCOUNTER — Telehealth: Payer: Self-pay

## 2021-12-28 ENCOUNTER — Ambulatory Visit (INDEPENDENT_AMBULATORY_CARE_PROVIDER_SITE_OTHER): Payer: Medicare Other | Admitting: Pharmacist

## 2021-12-28 DIAGNOSIS — I951 Orthostatic hypotension: Secondary | ICD-10-CM

## 2021-12-28 DIAGNOSIS — I25810 Atherosclerosis of coronary artery bypass graft(s) without angina pectoris: Secondary | ICD-10-CM

## 2021-12-28 DIAGNOSIS — T733XXA Exhaustion due to excessive exertion, initial encounter: Secondary | ICD-10-CM

## 2021-12-28 DIAGNOSIS — I214 Non-ST elevation (NSTEMI) myocardial infarction: Secondary | ICD-10-CM

## 2021-12-28 DIAGNOSIS — R001 Bradycardia, unspecified: Secondary | ICD-10-CM

## 2021-12-28 MED ORDER — ROSUVASTATIN CALCIUM 40 MG PO TABS: 40.0000 mg | ORAL_TABLET | Freq: Every day | ORAL | 1 refills | Status: DC

## 2021-12-28 MED ORDER — METOPROLOL SUCCINATE ER 50 MG PO TB24: 50.0000 mg | ORAL_TABLET | Freq: Every day | ORAL | 1 refills | Status: DC

## 2021-12-28 NOTE — Chronic Care Management (AMB) (Signed)
Chronic Care Management CCM Pharmacy Note  12/28/2021 Name:  Sabrina Montoya MRN:  [id number] DOB:  1941/06/04  Summary: - Tolerating current regimen better. HR/BP better controlled  Recommendations/Changes made from today's visit: - Recommended to continue current regimen at this time  Subjective: Sabrina Montoya is an 81 y.o. year old female who is a primary patient of Derrel Nip, Aris Everts, MD.  The CCM team was consulted for assistance with disease management and care coordination needs.    Engaged with patient by telephone for follow up visit for pharmacy case management and/or care coordination services.   Objective:  Medications Reviewed Today     Reviewed by De Hollingshead, RPH-CPP (Pharmacist) on 12/28/21 at 1014  Med List Status: <None>   Medication Order Taking? Sig Documenting Provider Last Dose Status Informant  amLODipine (NORVASC) 5 MG tablet [id number] Yes Take 5 mg by mouth daily. [provider] Taking Active   aspirin 81 MG tablet [id number] Yes Take 81 mg by mouth daily. [provider] Taking Active Self  cholecalciferol (VITAMIN D) 1000 UNITS tablet [id number] Yes Take 1,000 Units by mouth daily. [provider] Taking Active Self  isosorbide mononitrate (IMDUR) 60 MG 24 hr tablet [id number] Yes Take 1 tablet (60 mg total) by mouth daily. Nolberto Hanlon, MD Taking Active   metoprolol succinate (TOPROL-XL) 50 MG 24 hr tablet [id number] Yes Take 1 tablet (50 mg total) by mouth daily. Take with or immediately following a meal. Crecencio Mc, MD Taking Active   Multiple Vitamins-Minerals (PRESERVISION AREDS 2 PO) [id number] Yes Take by mouth 2 (two) times daily. [provider] Taking Active Self  nitroGLYCERIN (NITROSTAT) 0.4 MG SL tablet [id number]  Place 1 tablet (0.4 mg total) under the tongue every 5 (five) minutes as needed for chest pain. Crecencio Mc, MD  Active   omeprazole (PRILOSEC) 20 MG capsule [id number] Yes TAKE 1 CAPSULE(20 MG) BY  MOUTH DAILY Crecencio Mc, MD Taking Active   rosuvastatin (CRESTOR) 40 MG tablet [id number] Yes Take 1 tablet (40 mg total) by mouth daily. Crecencio Mc, MD Taking Active Self  Turmeric (QC TUMERIC COMPLEX PO) [id number] Yes Take 1 mg by mouth daily. [provider] Taking Active Self  Med List Note Leeanne Rio, [place] 11/13/17 6314): Please notify lab that pt needs a butterfly due to blood flow issues. Pt asked that I make a note in her chart so that lab tech dont get upset everytime she mentions it to them.            Pertinent Labs:   Lab Results  Component Value Date   HGBA1C 7.0 (H) 12/13/2021   Lab Results  Component Value Date   CHOL 114 12/13/2021   HDL 36.20 (L) 12/13/2021   LDLCALC 40 12/13/2021   LDLDIRECT 48.0 04/18/2018   TRIG 191.0 (H) 12/13/2021   CHOLHDL 3 12/13/2021   Lab Results  Component Value Date   CREATININE 1.11 12/13/2021   BUN 27 (H) 12/13/2021   NA 141 12/13/2021   K 5.1 12/13/2021   CL 105 12/13/2021   CO2 30 12/13/2021    SDOH:  (Social Determinants of Health) assessments and interventions performed:  SDOH Interventions    Flowsheet Row Most Recent Value  SDOH Interventions   Financial Strain Interventions Intervention Not Indicated       CCM Care Plan  Review of patient past medical history, allergies, medications, health status, including review of consultants reports, laboratory and other test  data, was performed as part of comprehensive evaluation and provision of chronic care management services.   Care Plan : Medication Management  Updates made by De Hollingshead, RPH-CPP since 12/28/2021 12:00 AM     Problem: Diabetes, HTN, CAD, PVD      Long-Range Goal: Disease Progression Prevention   Start Date: 05/12/2021  Recent Progress: On track  Priority: High  Note:   Current Barriers:  Suboptimal therapeutic regimen for diabetes , CAD  Pharmacist Clinical Goal(s):  Over the next 90 days, patient will  achieve control of diabetes as evidenced by A1c  through collaboration with PharmD and provider.   Interventions: 1:1 collaboration with Crecencio Mc, MD regarding development and update of comprehensive plan of care as evidenced by provider attestation and co-signature Inter-disciplinary care team collaboration (see longitudinal plan of care) Comprehensive medication review performed; medication list updated in electronic medical record  Health Maintenance Yearly diabetic eye exam: up to date Yearly diabetic foot exam: up to date Urine microalbumin: up to date Yearly influenza vaccination: up to date Td/Tdap vaccination: due - recommended to pursue Pneumonia vaccination: up to date COVID vaccinations: due for booster, but defer at least 3 months from [place] test Shingrix vaccinations: due - encouraged today  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 Consider discussion moving forward after evaluation/resolution of fatigue  Hypertension (CAD, s/p CABG 2006, PVD) Controlled; current treatment: isosorbide 60 mg daily - moved to QPM, amlodipine 5 mg daily - QAM, metoprolol succinate 50 mg QAM; follows w/ [facility] cardiology  Acute rise in Scr (38%) with telmisartan initiation. Avoiding ARB. Home vitals: 110-130s/70-80s, HR 60-70s; denies issues with hypotensive symptoms (dizziness, lightheadedness). Denies racing heart rate or needing to take nitroglycerin for chest pain  Recommended to continue current regimen at this time. Updated metoprolol script to match PCP intention.  Follow up with Dr. Rockey Situ as scheduled  Hyperlipidemia, secondary ASCVD prevention: Lipids controlled at goal <70; current treatment: rosuvastatin 40 mg daily;  Medications previously tried: niacin, simvastatin Antiplatelet regimen: aspirin 81 mg daily Recommended to continue current regimen at this  time.   GERD/hx esophagitis: Appropriately managed; current regimen: omeprazole 20 mg daily  PPI therapy appropriate given hx esophagitis. Previously recommended to continue current regimen at this time  Supplements: Vitamin D, Vitamin B12, Preservision AREDS 2, turmeric   Patient Goals/Self-Care Activities Over the next 90 days, patient will:  - take medications as prescribed check blood pressure periodically, document, and provide at future appointments      Plan: Telephone follow up appointment with care management team member scheduled for:  10 weeks  Catie Darnelle Maffucci, PharmD, [place], [place] Pharmacist [employer] at [employer] [phone]

## 2021-12-28 NOTE — Telephone Encounter (Signed)
Sabrina Montoya has been on antibiotics for an infected tooth and has to contact dentist about what to do next.  She will call us to let us know when she can return to exercise.

## 2021-12-28 NOTE — Patient Instructions (Addendum)
[place],   It was great talking to you today! Here is how we have you taking your medications:   Morning: - Amlodipine 5 mg - blood pressure - Metoprolol succinate 50 mg - blood pressure/heart rate - Omeprazole 20 mg - acid reflux - AREDS  - Turmeric  Mid-day: - Aspirin 81 mg  - Vitamin D  Evening: - Isosorbide 60 mg - prevention of chest pain - Rosuvastatin 40 mg - cholesterol - AREDS   We recommend the Shingrix (shingles) vaccine series for all over age 80. We also recommend the Tdap (tetanus, diptheria, and pertussis) vaccine. Both should have a $0 copay on all Medicare plans this year. You can pursue these without a prescription at your local pharmacy, or feel free to call our [place] at [facility] at [phone].  Take care!  Catie Darnelle Maffucci, PharmD  Visit Information  Following are the goals we discussed today:  Patient Goals/Self-Care Activities Over the next 90 days, patient will:  - take medications as prescribed check blood pressure periodically, document, and provide at future appointments        Plan: Telephone follow up appointment with care management team member scheduled for:  10 weeks   Catie Darnelle Maffucci, PharmD, [place], CPP Clinical Pharmacist [place] at [facility] [phone]   Please call the care guide team at [phone] if you need to cancel or reschedule your appointment.   The patient verbalized understanding of instructions, educational materials, and care plan provided today and agreed to receive a mailed copy of patient instructions, educational materials, and care plan.

## 2021-12-28 NOTE — Progress Notes (Signed)
Cardiac Individual Treatment Plan  Patient Details  Name: ALY SEIDENBERG MRN: [id number] Date of Birth: 02/26/1941 Referring Provider:   Flowsheet Row Cardiac Rehab from 10/05/79 in [facility] Cardiac and Pulmonary Rehab  Referring Provider Harle Stanford MD       Initial Encounter Date:  Flowsheet Row Cardiac Rehab from 10/05/79 in [facility] Cardiac and Pulmonary Rehab  Date 10/04/21       Visit Diagnosis: NSTEMI (non-ST elevated myocardial infarction) [facility])  Patient's Home Medications on Admission:  Current Outpatient Medications:    amLODipine (NORVASC) 5 MG tablet, Take 5 mg by mouth daily., Disp: , Rfl:    aspirin 81 MG tablet, Take 81 mg by mouth daily., Disp: , Rfl:    cholecalciferol (VITAMIN D) 1000 UNITS tablet, Take 1,000 Units by mouth daily., Disp: , Rfl:    clonazePAM (KLONOPIN) 0.5 MG tablet, Take 0.5 mg by mouth., Disp: , Rfl:    cyanocobalamin 1000 MCG tablet, Take by mouth., Disp: , Rfl:    isosorbide mononitrate (IMDUR) 60 MG 24 hr tablet, Take 1 tablet (60 mg total) by mouth daily., Disp: 30 tablet, Rfl: 0   metoprolol succinate (TOPROL-XL) 50 MG 24 hr tablet, Take 1.5 tablets (75 mg total) by mouth daily. Take with or immediately following a meal., Disp: 135 tablet, Rfl: 1   Multiple Vitamins-Minerals (PRESERVISION AREDS 2 PO), Take by mouth 2 (two) times daily., Disp: , Rfl:    nitroGLYCERIN (NITROSTAT) 0.4 MG SL tablet, Place 1 tablet (0.4 mg total) under the tongue every 5 (five) minutes as needed for chest pain., Disp: 50 tablet, Rfl: 3   omeprazole (PRILOSEC) 20 MG capsule, TAKE 1 CAPSULE(20 MG) BY MOUTH DAILY, Disp: 90 capsule, Rfl: 1   rosuvastatin (CRESTOR) 40 MG tablet, Take 1 tablet (40 mg total) by mouth daily., Disp: 90 tablet, Rfl: 0   Turmeric (QC TUMERIC COMPLEX PO), Take 1 mg by mouth daily., Disp: , Rfl:   Past Medical History: Past Medical History:  Diagnosis Date   Abdominal aortic aneurysm without mention of rupture    infrarenal, stable,  folllowed by Leotis Pain   Acute posthemorrhagic anemia    Arthritis    B12 deficiency    CAD (coronary artery disease), autologous vein bypass graft    Cardiac dysrhythmia, unspecified    Colon polyp    Depression    Diverticulitis of colon    Dizziness    DUE TO MEDICINES   Dysrhythmia    GERD (gastroesophageal reflux disease)    HOH (hard of hearing)    Hyperlipidemia    Hypertension    pt denies. placed on meds after CABG   IBS (irritable bowel syndrome)    Neuropathy    Neuropathy 07/01/2013   Peripheral vascular disease ([place])    s/p CEA    Reflux esophagitis    Rheumatic fever    possible at age 81   Sliding hiatal hernia    Tobacco abuse    Tobacco abuse     Tobacco Use: Social History   Tobacco Use  Smoking Status Former   Packs/day: 0.50   Years: 30.00   Pack years: 15.00   Types: Cigarettes   Quit date: 04/01/2012   Years since quitting: 9.7  Smokeless Tobacco Never  Tobacco Comments   quit for 2 years after sinus infection and 6 months after heart surgery    Labs: Recent Review Flowsheet Data     Labs for ITP Cardiac and Pulmonary Rehab Latest Ref Rng & Units  05/20/2019 05/13/2020 04/27/2021 06/06/2021 12/13/2021   Cholestrol 0 - 200 mg/dL 119 119 141 116 114   LDLCALC 0 - 99 mg/dL 57 44 63 48 40   LDLDIRECT mg/dL - - - - -   HDL >39.00 mg/dL 40.80 38.20(L) 44.60 44 36.20(L)   Trlycerides 0.0 - 149.0 mg/dL 106.0 186.0(H) 169.0(H) 122 191.0(H)   Hemoglobin A1c 4.6 - 6.5 % 6.7(H) 6.6(H) 7.3(H) 7.0(H) 7.0(H)        Exercise Target Goals: Exercise Program Goal: Individual exercise prescription set using results from initial 6 min walk test and THRR while considering  patients activity barriers and safety.   Exercise Prescription Goal: Initial exercise prescription builds to 30-45 minutes a day of aerobic activity, 2-3 days per week.  Home exercise guidelines will be given to patient during program as part of exercise prescription that the participant will  acknowledge.   Education: Aerobic Exercise: - Group verbal and visual presentation on the components of exercise prescription. Introduces F.I.T.T principle from ACSM for exercise prescriptions.  Reviews F.I.T.T. principles of aerobic exercise including progression. Written material given at graduation. Flowsheet Row Cardiac Rehab from 12/15/2021 in [facility] Cardiac and Pulmonary Rehab  Education need identified 10/04/21       Education: Resistance Exercise: - Group verbal and visual presentation on the components of exercise prescription. Introduces F.I.T.T principle from ACSM for exercise prescriptions  Reviews F.I.T.T. principles of resistance exercise including progression. Written material given at graduation. Flowsheet Row Cardiac Rehab from 12/15/2021 in [facility] Cardiac and Pulmonary Rehab  Date 11/10/21  Educator [facility]  Instruction Review Code 1- Verbalizes Understanding        Education: Exercise & Equipment Safety: - Individual verbal instruction and demonstration of equipment use and safety with use of the equipment. Flowsheet Row Cardiac Rehab from 12/15/2021 in [facility] Cardiac and Pulmonary Rehab  Date 08/18/21  Educator [facility]  Instruction Review Code 1- Verbalizes Understanding       Education: Exercise Physiology & General Exercise Guidelines: - Group verbal and written instruction with models to review the exercise physiology of the cardiovascular system and associated critical values. Provides general exercise guidelines with specific guidelines to those with heart or lung disease.  Flowsheet Row Cardiac Rehab from 12/15/2021 in [facility] Cardiac and Pulmonary Rehab  Date 10/27/21  Educator [facility]  Instruction Review Code 1- Verbalizes Understanding       Education: Flexibility, Balance, Mind/Body Relaxation: - Group verbal and visual presentation with interactive activity on the components of exercise prescription. Introduces F.I.T.T principle from ACSM for exercise prescriptions. Reviews  F.I.T.T. principles of flexibility and balance exercise training including progression. Also discusses the mind body connection.  Reviews various relaxation techniques to help reduce and manage stress (i.e. Deep breathing, progressive muscle relaxation, and visualization). Balance handout provided to take home. Written material given at graduation. Flowsheet Row Cardiac Rehab from 12/15/2021 in [facility] Cardiac and Pulmonary Rehab  Date 11/17/21  Educator [facility]  Instruction Review Code 1- Verbalizes Understanding       Activity Barriers & Risk Stratification:  Activity Barriers & Cardiac Risk Stratification - 10/04/21 1448       Activity Barriers & Cardiac Risk Stratification   Activity Barriers Back Problems;Neck/Spine Problems;Arthritis;Deconditioning;Muscular Weakness;Other (comment)    Comments History of rotator cuff on right    Cardiac Risk Stratification High             6 Minute Walk:  6 Minute Walk     Row Name 10/04/21 1452  6 Minute Walk   Phase Initial     Distance 800 feet     Walk Time 5.5 minutes  Break 2:38-3:04     # of Rest Breaks 1     MPH 1.65     METS 1.32     RPE 12     Perceived Dyspnea  3     VO2 Peak 4.62     Symptoms Yes (comment)     Comments SOB     Resting HR 57 bpm     Resting BP 110/66     Resting Oxygen Saturation  97 %     Exercise Oxygen Saturation  during 6 min walk 100 %     Max Ex. HR 76 bpm     Max Ex. BP 132/66     2 Minute Post BP 112/62              Oxygen Initial Assessment:   Oxygen Re-Evaluation:   Oxygen Discharge (Final Oxygen Re-Evaluation):   Initial Exercise Prescription:  Initial Exercise Prescription - 10/04/21 1500       Date of Initial Exercise RX and Referring Provider   Date 10/04/21    Referring Provider Harle Stanford MD      Recumbant Bike   Level 1    RPM 60    Minutes 15    METs 1.3      NuStep   Level 1    SPM 80    Minutes 15    METs 1.3      T5 Nustep   Level 1    SPM  80    Minutes 15    METs 1.3      Track   Laps 13    Minutes 15    METs 1.71      Prescription Details   Frequency (times per week) 2    Duration Progress to 30 minutes of continuous aerobic without signs/symptoms of physical distress      Intensity   THRR 40-80% of Max Heartrate 90-124    Ratings of Perceived Exertion 11-13    Perceived Dyspnea 0-4      Progression   Progression Continue progressive overload as per policy without signs/symptoms or physical distress.      Resistance Training   Training Prescription Yes    Weight 3 lb    Reps 10-15             Perform Capillary Blood Glucose checks as needed.  Exercise Prescription Changes:   Exercise Prescription Changes     Row Name 10/04/21 1500 10/17/21 1300 10/25/21 1100 11/02/21 0800 11/28/21 1000     Response to Exercise   Blood Pressure (Admit) 110/66 128/64 -- 106/58 124/60   Blood Pressure (Exercise) 132/66 132/64 -- 118/60 128/64   Blood Pressure (Exit) 112/62 122/60 -- 106/64 104/60   Heart Rate (Admit) 57 bpm 78 bpm -- 54 bpm 53 bpm   Heart Rate (Exercise) 76 bpm 89 bpm -- 89 bpm 97 bpm   Heart Rate (Exit) 55 bpm 68 bpm -- 70 bpm 54 bpm   Oxygen Saturation (Admit) 97 % -- -- -- --   Oxygen Saturation (Exercise) 100 % -- -- -- --   Oxygen Saturation (Exit) 100 % -- -- -- --   Rating of Perceived Exertion (Exercise) 12 12 -- 13 14   Perceived Dyspnea (Exercise) 3 -- -- -- --   Symptoms SOB -- -- none none   Comments walk test  results fourth exercise day -- -- --   Duration -- Progress to 30 minutes of  aerobic without signs/symptoms of physical distress -- Continue with 30 min of aerobic exercise without signs/symptoms of physical distress. Continue with 30 min of aerobic exercise without signs/symptoms of physical distress.   Intensity -- THRR unchanged -- THRR unchanged THRR unchanged     Progression   Progression -- Continue to progress workloads to maintain intensity without signs/symptoms of  physical distress. -- Continue to progress workloads to maintain intensity without signs/symptoms of physical distress. Continue to progress workloads to maintain intensity without signs/symptoms of physical distress.   Average METs -- 2.65 -- 2.37 2.26     Resistance Training   Training Prescription -- Yes -- Yes Yes   Weight -- 3 lb -- 3 lb 3 lb   Reps -- 10-15 -- 10-15 10-15     Interval Training   Interval Training -- -- -- No No     Treadmill   MPH -- -- -- -- 1.1   Grade -- -- -- -- 0.5   Minutes -- -- -- -- 15   METs -- -- -- -- 1.9     Recumbant Bike   Level -- 1 -- 1 --   Minutes -- 15 -- 15 --   METs -- 2.9 -- 2.9 --     NuStep   Level -- 1 -- 4 5   Minutes -- 15 -- 15 15   METs -- 2.4 -- 2.8 2.4     T5 Nustep   Level -- -- -- 1 1   Minutes -- -- -- 15 15   METs -- -- -- 1.8 2     Track   Laps -- -- -- 18 18   Minutes -- -- -- 15 15   METs -- -- -- 1.98 1.98     Home Exercise Plan   Plans to continue exercise at -- -- Home (comment)  walking, Youtube videos Home (comment)  walking, Youtube videos Home (comment)  walking, Youtube videos   Frequency -- -- Add 3 additional days to program exercise sessions.  start with 1 Add 3 additional days to program exercise sessions.  start with 1 Add 3 additional days to program exercise sessions.  start with 1   Initial Home Exercises Provided -- -- 10/25/21 10/25/21 10/25/21     Oxygen   Maintain Oxygen Saturation -- -- -- 88% or higher 88% or higher    Row Name 12/12/21 0800 12/26/21 1300           Response to Exercise   Blood Pressure (Admit) 116/56 132/66      Blood Pressure (Exit) 124/64 122/70      Heart Rate (Admit) 60 bpm 67 bpm      Heart Rate (Exercise) 81 bpm 91 bpm      Heart Rate (Exit) 61 bpm 66 bpm      Rating of Perceived Exertion (Exercise) 11 15      Symptoms none none      Duration Continue with 30 min of aerobic exercise without signs/symptoms of physical distress. Continue with 30 min of  aerobic exercise without signs/symptoms of physical distress.      Intensity THRR unchanged THRR unchanged        Progression   Progression Continue to progress workloads to maintain intensity without signs/symptoms of physical distress. Continue to progress workloads to maintain intensity without signs/symptoms of physical distress.  Average METs 2.98 2.34        Resistance Training   Training Prescription Yes Yes      Weight 3 lb 3 lb      Reps 10-15 10-15        Interval Training   Interval Training No No        Recumbant Bike   Level 1 1      Minutes 15 15      METs 3.15 --        NuStep   Level 4 5      Minutes 15 15      METs 2.8 2.3        T5 Nustep   Level -- 1      Minutes -- 15      METs -- 2.1        Track   Laps -- 30      Minutes -- 15      METs -- 2.63        Home Exercise Plan   Plans to continue exercise at Home (comment)  walking, Youtube videos Home (comment)  walking, Youtube videos      Frequency Add 3 additional days to program exercise sessions.  start with 1 Add 3 additional days to program exercise sessions.  start with 1      Initial Home Exercises Provided 10/25/21 10/25/21        Oxygen   Maintain Oxygen Saturation 88% or higher 88% or higher               Exercise Comments:   Exercise Comments     Row Name 10/06/21 1038 12/06/21 1152         Exercise Comments First full day of exercise!  Patient was oriented to gym and equipment including functions, settings, policies, and procedures.  Patient's individual exercise prescription and treatment plan were reviewed.  All starting workloads were established based on the results of the 6 minute walk test done at initial orientation visit.  The plan for exercise progression was also introduced and progression will be customized based on patient's performance and goals. Pt able to follow exercise prescription today, but experienced some chest discomfort that she has been experiencing  periodically. She stated she went to her PCP yesterday and her metoprolol dose was decreased from 116m to 74mdaily. She was encouraged to see her cardiologist and Melissa, [place] to his office to schedule an appointment and discuss her concerns. Will continue to monitor for progression.               Exercise Goals and Review:   Exercise Goals     Row Name 10/04/21 1531             Exercise Goals   Increase Physical Activity Yes       Intervention Provide advice, education, support and counseling about physical activity/exercise needs.;Develop an individualized exercise prescription for aerobic and resistive training based on initial evaluation findings, risk stratification, comorbidities and participant's personal goals.       Expected Outcomes Short Term: Attend rehab on a regular basis to increase amount of physical activity.;Long Term: Add in home exercise to make exercise part of routine and to increase amount of physical activity.;Long Term: Exercising regularly at least 3-5 days a week.       Increase Strength and Stamina Yes       Intervention Provide advice, education, support and counseling about physical activity/exercise  needs.;Develop an individualized exercise prescription for aerobic and resistive training based on initial evaluation findings, risk stratification, comorbidities and participant's personal goals.       Expected Outcomes Short Term: Increase workloads from initial exercise prescription for resistance, speed, and METs.;Short Term: Perform resistance training exercises routinely during rehab and add in resistance training at home;Long Term: Improve cardiorespiratory fitness, muscular endurance and strength as measured by increased METs and functional capacity (6MWT)       Able to understand and use rate of perceived exertion (RPE) scale Yes       Intervention Provide education and explanation on how to use RPE scale       Expected Outcomes Short  Term: Able to use RPE daily in rehab to express subjective intensity level;Long Term:  Able to use RPE to guide intensity level when exercising independently       Able to understand and use Dyspnea scale Yes       Intervention Provide education and explanation on how to use Dyspnea scale       Expected Outcomes Short Term: Able to use Dyspnea scale daily in rehab to express subjective sense of shortness of breath during exertion;Long Term: Able to use Dyspnea scale to guide intensity level when exercising independently       Knowledge and understanding of Target Heart Rate Range (THRR) Yes       Intervention Provide education and explanation of THRR including how the numbers were predicted and where they are located for reference       Expected Outcomes Short Term: Able to state/look up THRR;Long Term: Able to use THRR to govern intensity when exercising independently;Short Term: Able to use daily as guideline for intensity in rehab       Able to check pulse independently Yes       Intervention Provide education and demonstration on how to check pulse in carotid and radial arteries.;Review the importance of being able to check your own pulse for safety during independent exercise       Expected Outcomes Short Term: Able to explain why pulse checking is important during independent exercise;Long Term: Able to check pulse independently and accurately       Understanding of Exercise Prescription Yes       Intervention Provide education, explanation, and written materials on patient's individual exercise prescription       Expected Outcomes Short Term: Able to explain program exercise prescription;Long Term: Able to explain home exercise prescription to exercise independently                Exercise Goals Re-Evaluation :  Exercise Goals Re-Evaluation     Row Name 10/06/21 1038 10/17/21 1340 10/25/21 1133 11/02/21 0804 11/08/21 1140     Exercise Goal Re-Evaluation   Exercise Goals Review  Increase Physical Activity;Able to understand and use rate of perceived exertion (RPE) scale;Knowledge and understanding of Target Heart Rate Range (THRR);Understanding of Exercise Prescription;Increase Strength and Stamina;Able to understand and use Dyspnea scale;Able to check pulse independently Increase Physical Activity;Increase Strength and Stamina Increase Physical Activity;Increase Strength and Stamina Increase Physical Activity;Increase Strength and Stamina;Understanding of Exercise Prescription Increase Physical Activity;Increase Strength and Stamina;Understanding of Exercise Prescription   Comments Reviewed RPE and dyspnea scales, THR and program prescription with pt today.  Pt voiced understanding and was given a copy of goals to take home. Neftaly has done well in her first few sessions.  Staff will monitor progress. Reviewed home exercise with pt today.  Pt plans  to walk and do [employer] for exercise.  Reviewed THR, pulse, RPE, sign and symptoms, pulse oximetery and when to call 911 or MD.  Also discussed weather considerations and indoor options.  Pt voiced understanding. Catrice is doing well in rehab.  Sh eis on level 4 for the NuStep and 18 laps on the track.  We will continue to monitor her progress. Korissa is doing well in rehab.  She is not walking at home and not doing much cardio due to her back pain.  She did get some hand weights for Christmas from her daughter.  She is doing some leg exercises at home.  We talked about trying the chair routines on our YouTube channel.  I resent it to her.   Expected Outcomes Short: Use RPE daily to regulate intensity. Long: Follow program prescription in THR. Short:  attend consistently  Long:  improve overall stamina Short: Add on 1 day of exercise at home and check HR Long: Exercise independently at home at appropriate prescription Short: Move up on T5 NuStep.  Long: Conitnue to improve stamina. Short: Try chair routines at home Long: Conitnue to  strengthen back    [place] Name 11/28/21 1048 12/12/21 0847 12/13/21 1133 12/26/21 1347       Exercise Goal Re-Evaluation   Exercise Goals Review Increase Physical Activity;Increase Strength and Stamina Increase Physical Activity;Increase Strength and Stamina Increase Physical Activity;Increase Strength and Stamina Increase Physical Activity;Increase Strength and Stamina;Understanding of Exercise Prescription    Comments Raelynn's attendance has improved and she was able to walk on the treadmill for the first time and tolerated it well. She also increased her level on the T4 Nustep to level 5 which is a huge improvement  She would beneift from increasing to 4 lbs for handweights. Dannie has improved average MET level.  She is still using 3 lb weights.  Staff will encourage trying 4 lb for strength work. She reports not doing any structured exercise at home due to loss of energy. She knows that she needs to. She would like to start to do weights that her daughter bought her. She rpeorts that when she first started she had a hard time recovering, but now is doing much better and can even walk all the way to the mailbox. Gennette is doing well in rehab. Her chest pain is feeling better managed. She back up to 30 laps. We will continue to encouraged her to increase the T5 NuStep.    Expected Outcomes Short: Increase to 4 lbs for handweights Long: Continue to increase MET level Short: try 4 lb and continue TM Long:  continue to build stamina Short: try using handweights at home TM Long:  continue to build stamina Short: Increase T5 NuStep Long: Continue to improve stamina             Discharge Exercise Prescription (Final Exercise Prescription Changes):  Exercise Prescription Changes - 12/26/21 1300       Response to Exercise   Blood Pressure (Admit) 132/66    Blood Pressure (Exit) 122/70    Heart Rate (Admit) 67 bpm    Heart Rate (Exercise) 91 bpm    Heart Rate (Exit) 66 bpm    Rating of Perceived Exertion  (Exercise) 15    Symptoms none    Duration Continue with 30 min of aerobic exercise without signs/symptoms of physical distress.    Intensity THRR unchanged      Progression   Progression Continue to progress workloads to maintain  intensity without signs/symptoms of physical distress.    Average METs 2.34      Resistance Training   Training Prescription Yes    Weight 3 lb    Reps 10-15      Interval Training   Interval Training No      Recumbant Bike   Level 1    Minutes 15      NuStep   Level 5    Minutes 15    METs 2.3      T5 Nustep   Level 1    Minutes 15    METs 2.1      Track   Laps 30    Minutes 15    METs 2.63      Home Exercise Plan   Plans to continue exercise at Home (comment)   walking, Youtube videos   Frequency Add 3 additional days to program exercise sessions.   start with 1   Initial Home Exercises Provided 10/25/21      Oxygen   Maintain Oxygen Saturation 88% or higher             Nutrition:  Target Goals: Understanding of nutrition guidelines, daily intake of sodium <1560m, cholesterol <2092m calories 30% from fat and 7% or less from saturated fats, daily to have 5 or more servings of fruits and vegetables.  Education: All About Nutrition: -Group instruction provided by verbal, written material, interactive activities, discussions, models, and posters to present general guidelines for heart healthy nutrition including fat, fiber, MyPlate, the role of sodium in heart healthy nutrition, utilization of the nutrition label, and utilization of this knowledge for meal planning. Follow up email sent as well. Written material given at graduation. [facility]  Education need identified 10/04/21  Date 12/01/21  Educator MCQuitmanInstruction Review Code 1- Verbalizes Understanding       Biometrics:  Pre Biometrics - 10/04/21 1450       Pre Biometrics   Height '5\' 4"'  (1.626 m)     Weight 152 lb 9.6 oz (69.2 kg)    BMI (Calculated) 26.18    Single Leg Stand 12.4 seconds              Nutrition Therapy Plan and Nutrition Goals:  Nutrition Therapy & Goals - 12/13/21 1109       Nutrition Therapy   RD appointment deferred Yes   Had an appointment scheduled for SuMalynan January - had to cancel due to provider. SuSharlaeports that she would not like to reschedule anytime soon. Will defer and continue to follow up.     Personal Nutrition Goals   Nutrition Goal Had an appointment scheduled for SuUilanin January - had to cancel due to provider. SuKaileneeports that she would not like to reschedule anytime soon. Will defer and continue to follow up.             Nutrition Assessments:  MEDIFICTS Score Key: ?70 Need to make dietary changes  40-70 Heart Healthy Diet ? 40 Therapeutic Level Cholesterol Diet  [facility]  Picture Your Plate Total Score on Admission 66      Picture Your Plate Scores: <4<36nhealthy dietary pattern with much room for improvement. 41-50 Dietary pattern unlikely to meet recommendations for good health and room for improvement. 51-60 More healthful dietary pattern, with some room for improvement.  >60 Healthy dietary  pattern, although there may be some specific behaviors that could be improved.    Nutrition Goals Re-Evaluation:  Nutrition Goals Re-Evaluation     [place] Name 10/25/21 1155 11/08/21 1136           Goals   Comment Katja would like to meet with RD in person after the new year; RD does not have appointment schedule for all of January yet - will connect with Renell again next week to schedule. Karmin now has an appoinment set with Melissa for 11/22/21.               Nutrition Goals Discharge (Final Nutrition Goals Re-Evaluation):  Nutrition Goals Re-Evaluation - 11/08/21 1136       Goals   Comment Kemyah now has an appoinment set with Melissa for 11/22/21.              Psychosocial: Target Goals: Acknowledge presence or absence of significant depression and/or stress, maximize coping skills, provide positive support system. Participant is able to verbalize types and ability to use techniques and skills needed for reducing stress and depression.   Education: Stress, Anxiety, and Depression - Group verbal and visual presentation to define topics covered.  Reviews how body is impacted by stress, anxiety, and depression.  Also discusses healthy ways to reduce stress and to treat/manage anxiety and depression.  Written material given at graduation.   Education: Sleep Hygiene -Provides group verbal and written instruction about how sleep can affect your health.  Define sleep hygiene, discuss sleep cycles and impact of sleep habits. Review good sleep hygiene tips.    Initial Review & Psychosocial Screening:  Initial Psych Review & Screening - 08/18/21 1042       Initial Review   Current issues with Current Anxiety/Panic      Family Dynamics   Good Support System? Yes    Comments She can look to her two daughters for support. Her family support is up and down. If she needs something they will be there.      Barriers   Psychosocial barriers to participate in program The patient should benefit from training in stress management and relaxation.      Screening Interventions   Interventions Encouraged to exercise;Provide feedback about the scores to participant;To provide support and resources with identified psychosocial needs    Expected Outcomes Short Term goal: Utilizing psychosocial counselor, staff and physician to assist with identification of specific Stressors or current issues interfering with healing process. Setting desired goal for each stressor or current issue identified.;Long Term Goal: Stressors or current issues are controlled or eliminated.;Short Term goal: Identification and review with participant of any Quality of Life or Depression  concerns found by scoring the questionnaire.;Long Term goal: The participant improves quality of Life and PHQ9 Scores as seen by post scores and/or verbalization of changes             Quality of Life Scores:   Quality of Life - 10/04/21 1519       Quality of Life   Select Quality of Life      Quality of Life Scores   Health/Function Pre 17.57 %    Socioeconomic Pre 19.33 %    Psych/Spiritual Pre 22.93 %    Family Pre 22.5 %    GLOBAL Pre 19.76 %            Scores of 19 and below usually indicate a poorer quality of life in these areas.  A difference of  2-3  points is a clinically meaningful difference.  A difference of 2-3 points in the total score of the Quality of Life Index has been associated with significant improvement in overall quality of life, self-image, physical symptoms, and general health in studies assessing change in quality of life.  PHQ-9: Recent Review Flowsheet Data     Depression screen [facility] 2/9 12/19/2021 12/05/2021 11/08/2021 10/04/2021 09/07/2021   Decreased Interest 0 0 0 1 0   Down, Depressed, Hopeless 0 0 0 0 0   PHQ - 2 Score 0 0 0 1 0   Altered sleeping - - 1  1 -   Tired, decreased energy - - 0 1 -   Change in appetite - - 0 1 -   Feeling bad or failure about yourself  - - 0 0 -   Trouble concentrating - - 0 1 -   Moving slowly or fidgety/restless - - 0 0 -   Suicidal thoughts - - 0 0 -   PHQ-9 Score - - 1 5 -   Difficult doing work/chores - - Not difficult at all Not difficult at all -      Interpretation of Total Score  Total Score Depression Severity:  1-4 = Minimal depression, 5-9 = Mild depression, 10-14 = Moderate depression, 15-19 = Moderately severe depression, 20-27 = Severe depression   Psychosocial Evaluation and Intervention:  Psychosocial Evaluation - 08/18/21 1044       Psychosocial Evaluation & Interventions   Interventions Encouraged to exercise with the program and follow exercise prescription;Stress management  education;Relaxation education    Comments She can look to her two daughters for support. Her family support is up and down. If she needs something they will be there.    Expected Outcomes Short: Start [facility] to help with mood. Long: Maintain a healthy mental state    Continue Psychosocial Services  Follow up required by staff             Psychosocial Re-Evaluation:  Psychosocial Re-Evaluation     [place] Name 10/25/21 1143 11/08/21 1134 12/13/21 1128         Psychosocial Re-Evaluation   Current issues with None Identified History of Depression;Current Depression;Current Sleep Concerns History of Depression;Current Depression;Current Sleep Concerns;Current Stress Concerns     Comments [place] reports trying to make it day to day with no major reported stressors. She reports not being on medication for her mental health. 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 She goes in her sunroom with her cat to catch up on her soap operas and watch christmas movies. She enjoyed decorating for CHristmas and staying busy. She reports not sleeping when her back hurts (she takes naps), she reports "basically anytime I am prone, I sleep". Reviewed patient health questionnaire (PHQ-9) with patient for follow up. Previously, patients score indicated signs/symptoms of depression.  Reviewed to see if patient is improving symptom wise while in program.  Score improved and patient states that it is because they have been able to see more friends and family over Christmas.  She has been struggling with her sleep on days she exercises as she will fall asleep in her chair and then wake around 3am and has a hard time getting back to sleep after that. She continues to get through her daily chores on her own. Elisse has been working with fatigue and shortness of breath with her continues chest pain. Her daughters can help  her such as helping getting her car  serviced - 45 minutes away; she reports they are a good support system for her and call her daily. She rpeorts keeping busy to help to reduce stress being alone in her big house. SHe has a blanket and heating pad to help her relax, last week she watched the [employer]. She reports sleeping at night better than she had before as her dreams have calmed down. She reports having more energy an dneeding to take less breaks.     Expected Outcomes ST: continue to practice stress reducing activities, try large print books to read LT: maintain positive attitude. Short: Continue to work on sleep Long: Continue to focus on positive things. Short: Continue to work on sleep Long: Continue to focus on positive things.     Interventions Encouraged to attend Cardiac Rehabilitation for the exercise Encouraged to attend Cardiac Rehabilitation for the exercise Encouraged to attend Cardiac Rehabilitation for the exercise     Continue Psychosocial Services  Follow up required by staff Follow up required by staff Follow up required by staff              Psychosocial Discharge (Final Psychosocial Re-Evaluation):  Psychosocial Re-Evaluation - 12/13/21 1128       Psychosocial Re-Evaluation   Current issues with History of Depression;Current Depression;Current Sleep Concerns;Current Stress Concerns    Comments Ketrina has been working with fatigue and shortness of breath with her continues chest pain. Her daughters can help her such as helping getting her car serviced - 45 minutes away; she reports they are a good support system for her and call her daily. She rpeorts keeping busy to help to reduce stress being alone in her big house. SHe has a blanket and heating pad to help her relax, last week she watched the [employer]. She reports sleeping at night better than she had before as her dreams have calmed down. She reports having more energy an dneeding to take less breaks.    Expected Outcomes Short: Continue to work on  sleep Long: Continue to focus on positive things.    Interventions Encouraged to attend Cardiac Rehabilitation for the exercise    Continue Psychosocial Services  Follow up required by staff             Vocational Rehabilitation: Provide vocational rehab assistance to qualifying candidates.   Vocational Rehab Evaluation & Intervention:   Education: Education Goals: Education classes will be provided on a variety of topics geared toward better understanding of heart health and risk factor modification. Participant will state understanding/return demonstration of topics presented as noted by education test scores.  Learning Barriers/Preferences:  Learning Barriers/Preferences - 08/18/21 1041       Learning Barriers/Preferences   Learning Barriers None    Learning Preferences None             General Cardiac Education Topics:  AED/CPR: - Group verbal and written instruction with the use of models to demonstrate the basic use of the AED with the basic ABC's of resuscitation.   Anatomy and Cardiac Procedures: - Group verbal and visual presentation and models provide information about basic cardiac anatomy and function. Reviews the testing methods done to diagnose heart disease and the outcomes of the test results. Describes the treatment choices: Medical Management, Angioplasty, or Coronary Bypass Surgery for treating various heart conditions including Myocardial Infarction, Angina, Valve Disease, and Cardiac Arrhythmias.  Written material given at graduation. Flowsheet Row Cardiac Rehab from 12/15/2021 in  [place] Cardiac and Pulmonary Rehab  Date 11/10/21  Educator SB  Instruction Review Code 1- Verbalizes Understanding       Medication Safety: - Group verbal and visual instruction to review commonly prescribed medications for heart and lung disease. Reviews the medication, class of the drug, and side effects. Includes the steps to properly store meds and maintain the  prescription regimen.  Written material given at graduation.   Intimacy: - Group verbal instruction through game format to discuss how heart and lung disease can affect sexual intimacy. Written material given at graduation..   Know Your Numbers and Heart Failure: - Group verbal and visual instruction to discuss disease risk factors for cardiac and pulmonary disease and treatment options.  Reviews associated critical values for Overweight/Obesity, Hypertension, Cholesterol, and Diabetes.  Discusses basics of heart failure: signs/symptoms and treatments.  Introduces Heart Failure Zone chart for action plan for heart failure.  Written material given at graduation. Flowsheet Row Cardiac Rehab from 12/15/2021 in [facility] Cardiac and Pulmonary Rehab  Date 12/08/21  Educator [facility]  Instruction Review Code 1- Verbalizes Understanding       Infection Prevention: - Provides verbal and written material to individual with discussion of infection control including proper hand washing and proper equipment cleaning during exercise session. Flowsheet Row Cardiac Rehab from 12/15/2021 in [facility] Cardiac and Pulmonary Rehab  Date 08/18/21  Educator [facility]  Instruction Review Code 1- Verbalizes Understanding       Falls Prevention: - Provides verbal and written material to individual with discussion of falls prevention and safety. Flowsheet Row Cardiac Rehab from 12/15/2021 in [facility] Cardiac and Pulmonary Rehab  Date 08/18/21  Educator [facility]  Instruction Review Code 1- Verbalizes Understanding       Other: -Provides group and verbal instruction on various topics (see comments)   Knowledge Questionnaire Score:  Knowledge Questionnaire Score - 10/04/21 1454       Knowledge Questionnaire Score   Pre Score 22/26: Angina, Aerobic exercise, Nutrition             Core Components/Risk Factors/Patient Goals at Admission:  Personal Goals and Risk Factors at Admission - 10/04/21 1531       Core Components/Risk  Factors/Patient Goals on Admission    Weight Management Yes;Weight Loss    Intervention Weight Management: Develop a combined nutrition and exercise program designed to reach desired caloric intake, while maintaining appropriate intake of nutrient and fiber, sodium and fats, and appropriate energy expenditure required for the weight goal.;Weight Management: Provide education and appropriate resources to help participant work on and attain dietary goals.;Weight Management/Obesity: Establish reasonable short term and long term weight goals.    Admit Weight 152 lb (68.9 kg)    Goal Weight: Short Term 149 lb (67.6 kg)    Goal Weight: Long Term 145 lb (65.8 kg)    Expected Outcomes Short Term: Continue to assess and modify interventions until short term weight is achieved;Long Term: Adherence to nutrition and physical activity/exercise program aimed toward attainment of established weight goal;Weight Loss: Understanding of general recommendations for a balanced deficit meal plan, which promotes 1-2 lb weight loss per week and includes a negative energy balance of [phone] kcal/d;Understanding recommendations for meals to include 15-35% energy as protein, 25-35% energy from fat, 35-60% energy from carbohydrates, less than 266m of dietary cholesterol, 20-35 gm of total fiber daily;Understanding of distribution of calorie intake throughout the day with the consumption of 4-5 meals/snacks    Diabetes Yes    Intervention Provide education about  signs/symptoms and action to take for hypo/hyperglycemia.;Provide education about proper nutrition, including hydration, and aerobic/resistive exercise prescription along with prescribed medications to achieve blood glucose in normal ranges: Fasting glucose 65-99 mg/dL    Expected Outcomes Short Term: Participant verbalizes understanding of the signs/symptoms and immediate care of hyper/hypoglycemia, proper foot care and importance of medication, aerobic/resistive exercise  and nutrition plan for blood glucose control.;Long Term: Attainment of HbA1C < 7%.    Hypertension Yes    Intervention Provide education on lifestyle modifcations including regular physical activity/exercise, weight management, moderate sodium restriction and increased consumption of fresh fruit, vegetables, and low fat dairy, alcohol moderation, and smoking cessation.;Monitor prescription use compliance.    Expected Outcomes Short Term: Continued assessment and intervention until BP is < 140/15m HG in hypertensive participants. < 130/834mHG in hypertensive participants with diabetes, heart failure or chronic kidney disease.;Long Term: Maintenance of blood pressure at goal levels.    Lipids Yes    Intervention Provide education and support for participant on nutrition & aerobic/resistive exercise along with prescribed medications to achieve LDL <7038mHDL >56m20m  Expected Outcomes Short Term: Participant states understanding of desired cholesterol values and is compliant with medications prescribed. Participant is following exercise prescription and nutrition guidelines.;Long Term: Cholesterol controlled with medications as prescribed, with individualized exercise RX and with personalized nutrition plan. Value goals: LDL < 70mg77mL > 40 mg.             Education:Diabetes - Individual verbal and written instruction to review signs/symptoms of diabetes, desired ranges of glucose level fasting, after meals and with exercise. Acknowledge that pre and post exercise glucose checks will be done for 3 sessions at entry of program. [place] [facility]  Date 08/18/21  Educator [facility] Review Code 1- Verbalizes Understanding       Core Components/Risk Factors/Patient Goals Review:   Goals and Risk Factor Review     Row Name 10/25/21 1127 11/08/21 1136 12/13/21 1111         Core Components/Risk Factors/Patient Goals Review    Personal Goals Review Lipids;Diabetes Weight Management/Obesity;Diabetes;Hypertension;Lipids Weight Management/Obesity;Diabetes;Hypertension;Lipids     Review She has been having some consistent chest pain and her arm has been aching - she switched doctors, but is not sure she made the right decision, she has not spoken to him about the pain she is having; encouraged her to talk with her doctor. She reports that the only things she can do are the basics because she is so tired and weak. She has recently made two pots of soup so she has easy meals on her bad days. SHe does not normally check her BG and manages her T2DM with diet - she is not sure what her A1C is. She had recently had COVID for a month - she reports tht she had a rough time recovering. She reports having back pain and has to stop and rest sometimes when she is active. Malaiyah iJoeloing well in rehab.  She enjoyed Christmas fesitivies.  Her weight has gone up some as a result, but she hopes to get it back down now.  Her blood sugars have not felt like they have given her any problems.  She has not been checking it at home.  Her blood pressurs are doing well. She continues to have chest pain and had follow up last week and they told her it was just her angina.  They did not  change her prescriptions at all. Ruthann had been struggling with chest pain for a while now and feels her previous doctor was not listening; her doctor had told her it was angina. She feels the pain is slowly getting worse - she is unable to describe the type of pain aside from it hurting. They had recently (12/07/21) completed a chest CT to investigate further. Impressions included "1.  No evidence of pulmonary embolism. 2.  No acute cardiopulmonary process. 3. Mild cardiomegaly. Ectatic ascending aorta measuring up to 3 cm. 4. Aneurysmal dilatation of the infrarenal abdominal aorta measuring  up to 3.6 cm, not significantly changed from prior examination,  continued follow-up is  recommended." She has made an appointment with her new cardiologist Dr. Rockey Situ March 1st. She will take her BP 2x/day - can be high or low (140/60s to 80/60s); today 126/60. She is taking all her medications as directed with no issues. She reports her weight has been stable within 3lbs. She reports her MD reports anemia which they cannot find the cause - they are still looking. She does not check her BG at home - due to "boarderline diabetes".     Expected Outcomes ST: speak with MD about chest pain LT: monitor risk factors Short: Continue to work with chest pain Long: Continue to monitor risk factors. Short: Continue to work with chest pain and anemia with doctors Long: Continue to monitor risk factors.              Core Components/Risk Factors/Patient Goals at Discharge (Final Review):   Goals and Risk Factor Review - 12/13/21 1111       Core Components/Risk Factors/Patient Goals Review   Personal Goals Review Weight Management/Obesity;Diabetes;Hypertension;Lipids    Review Senai had been struggling with chest pain for a while now and feels her previous doctor was not listening; her doctor had told her it was angina. She feels the pain is slowly getting worse - she is unable to describe the type of pain aside from it hurting. They had recently (12/07/21) completed a chest CT to investigate further. Impressions included "1.  No evidence of pulmonary embolism. 2.  No acute cardiopulmonary process. 3. Mild cardiomegaly. Ectatic ascending aorta measuring up to 3 cm. 4. Aneurysmal dilatation of the infrarenal abdominal aorta measuring  up to 3.6 cm, not significantly changed from prior examination,  continued follow-up is recommended." She has made an appointment with her new cardiologist Dr. Rockey Situ March 1st. She will take her BP 2x/day - can be high or low (140/60s to 80/60s); today 126/60. She is taking all her medications as directed with no issues. She reports her weight has been stable within 3lbs. She  reports her MD reports anemia which they cannot find the cause - they are still looking. She does not check her BG at home - due to "boarderline diabetes".    Expected Outcomes Short: Continue to work with chest pain and anemia with doctors Long: Continue to monitor risk factors.             ITP Comments:  ITP Comments     Row Name 08/18/21 1047 10/04/21 1151 10/05/21 0952 10/06/21 1038 11/02/21 0637   ITP Comments Virtual Visit completed. Patient informed on EP and RD appointment and 6 Minute walk test. Patient also informed of patient health questionnaires on My Chart. Patient Verbalizes understanding. Visit diagnosis can be found in [facility] 06/05/[facility]. Completed 6MWT and gym orientation. Initial ITP created and sent for review to Dr. Emily Filbert,  Medical Director. 30 Day review completed. Medical Director ITP review done, changes made as directed, and signed approval by Medical Director.    New to program First full day of exercise!  Patient was oriented to gym and equipment including functions, settings, policies, and procedures.  Patient's individual exercise prescription and treatment plan were reviewed.  All starting workloads were established based on the results of the 6 minute walk test done at initial orientation visit.  The plan for exercise progression was also introduced and progression will be customized based on patient's performance and goals. 30 Day review completed. Medical Director ITP review done, changes made as directed, and signed approval by Medical Director.    [place] Name 11/30/21 0805 12/06/21 1152 12/28/21 0728       ITP Comments 30 Day review completed. Medical Director ITP review done, changes made as directed, and signed approval by Medical Director. Pt able to follow exercise prescription today, but experienced some chest discomfort that she has been experiencing periodically. She stated she went to her PCP yesterday and her metoprolol dose was decreased from 19m to 75m daily. She was encouraged to see her cardiologist and Melissa, RDOberlinescorted her to his office to schedule an appointment and discuss her concerns. Will continue to monitor for progression. 30 Day review completed. Medical Director ITP review done, changes made as directed, and signed approval by Medical Director.              Comments:

## 2021-12-30 ENCOUNTER — Telehealth: Payer: Self-pay | Admitting: Pharmacist

## 2021-12-30 NOTE — Telephone Encounter (Signed)
Latoya brought to my attention that patient has a fasting lab appointment scheduled next week, though fasting labs were just drawn in the beginning of February. I do not see that anything needs to be rechecked this soon. [place] lab appointment, called patient and left a voicemail stating the above.

## 2022-01-03 DIAGNOSIS — I25810 Atherosclerosis of coronary artery bypass graft(s) without angina pectoris: Secondary | ICD-10-CM

## 2022-01-03 DIAGNOSIS — E118 Type 2 diabetes mellitus with unspecified complications: Secondary | ICD-10-CM | POA: Diagnosis not present

## 2022-01-03 DIAGNOSIS — E785 Hyperlipidemia, unspecified: Secondary | ICD-10-CM

## 2022-01-03 DIAGNOSIS — I10 Essential (primary) hypertension: Secondary | ICD-10-CM | POA: Diagnosis not present

## 2022-01-04 ENCOUNTER — Ambulatory Visit (INDEPENDENT_AMBULATORY_CARE_PROVIDER_SITE_OTHER): Payer: Medicare Other | Admitting: Cardiovascular Disease

## 2022-01-04 ENCOUNTER — Other Ambulatory Visit: Payer: Self-pay

## 2022-01-04 ENCOUNTER — Encounter: Payer: Self-pay | Admitting: Cardiovascular Disease

## 2022-01-04 VITALS — BP 130/60 | HR 66 | Ht 63.0 in | Wt 152.5 lb

## 2022-01-04 DIAGNOSIS — I7143 Infrarenal abdominal aortic aneurysm, without rupture: Secondary | ICD-10-CM | POA: Diagnosis not present

## 2022-01-04 DIAGNOSIS — I6523 Occlusion and stenosis of bilateral carotid arteries: Secondary | ICD-10-CM | POA: Diagnosis not present

## 2022-01-04 DIAGNOSIS — I25118 Atherosclerotic heart disease of native coronary artery with other forms of angina pectoris: Secondary | ICD-10-CM | POA: Diagnosis not present

## 2022-01-04 DIAGNOSIS — I739 Peripheral vascular disease, unspecified: Secondary | ICD-10-CM

## 2022-01-04 DIAGNOSIS — J432 Centrilobular emphysema: Secondary | ICD-10-CM | POA: Diagnosis not present

## 2022-01-04 DIAGNOSIS — E1140 Type 2 diabetes mellitus with diabetic neuropathy, unspecified: Secondary | ICD-10-CM | POA: Diagnosis not present

## 2022-01-04 MED ORDER — NITROGLYCERIN 0.4 MG SL SUBL: 0.4000 mg | SUBLINGUAL_TABLET | SUBLINGUAL | 4 refills | Status: DC | PRN

## 2022-01-04 NOTE — Progress Notes (Addendum)
Cardiology Office Note  Date:  01/04/2022   ID:  VENEDA KIRKSEY, DOB February 20, 1941, MRN [id number]  PCP:  Crecencio Mc, MD   Chief Complaint  Patient presents with   New Patient (Initial Visit)    Ref by Dr. Claudina Lick to establish care for CAD/CABG x 3 in 2006; former Dr. Saralyn Pilar & Christinia Gully patient. Patient c/o chest pain and shortness of breath with little to no exertion; patient has cardiac rehab on Tuesday and Thursday. Medications reviewed by the patient verbally.     HPI:  Ms. Lucciana Head is a 81 year old woman with past medical history of Coronary disease CABG,  LIMA to LAD, SVG to L PDA and OM1 2006 Patent LIMA to LAD, SVG to L PDA, occluded SVG to OM1 06/07/2021 COPD / quit tobacco abuse 2013 PAD, Status post carotid endarterectomy Chronic kidney disease Who presents by referral from Laverna Peace for consultation of her coronary disease and continued chest pain  Previously followed by Jefm Bryant, last seen December 2022  Prior cardiac history reviewed with her in detail hospitalized for non-ST elevation myocardial infarction August 2022  cardiac catheterization 06/07/2021 which revealed severe three-vessel coronary artery disease with occluded ostial LAD, occluded left circumflex, occluded nondominant RCA, with patent LIMA to distal LAD, patent SVG to left PDA with retrograde flow to proximal left circumflex and OM1, and occluded SVG to OM1. Patient treated medically.   LVEF greater than 55%.   07/01/2021 when she developed elevated blood pressures at home, 191/82, associated with heart pounding   7-day Holter monitor (07/28/2021 through 08/04/2021) revealed predominant sinus bradycardia with mean heart rate of 57 bpm, heart rate range 46 to 127 bpm, infrequent premature atrial contractions and premature ventricular contractions, and rare atrial runs the longest lasting 23 beats.  LDL cholesterol was 48 on 06/06/2021. A1c trending higher, greater than 7.  Try to treat this with diet  alone  As reported having significant chest pain at 3 AM What happened if she got up to go to bed or use the restroom Was taking all her medications in the morning including amlodipine, isosorbide, metoprolol Recently moved the isosorbide to dinnertime, symptoms have improved  EKG personally reviewed by myself on todays visit Normal sinus rhythm rate 66 bpm nonspecific ST abnormality   PMH:   has a past medical history of Abdominal aortic aneurysm without mention of rupture, Acute posthemorrhagic anemia, Arthritis, B12 deficiency, CAD (coronary artery disease), autologous vein bypass graft, Cardiac dysrhythmia, unspecified, Colon polyp, Depression, Diverticulitis of colon, Dizziness, Dysrhythmia, GERD (gastroesophageal reflux disease), HOH (hard of hearing), Hyperlipidemia, Hypertension, IBS (irritable bowel syndrome), Neuropathy, Neuropathy (07/01/2013), Peripheral vascular disease ([place]), Reflux esophagitis, Rheumatic fever, Sliding hiatal hernia, Tobacco abuse, and Tobacco abuse.  PSH:    Past Surgical History:  Procedure Laterality Date   ABDOMINAL HYSTERECTOMY     ABDOMINAL SURGERY  1976   for pain secondary to scar tissue, s/p apply   APPENDECTOMY  1974   BACK SURGERY     CAROTID ENDARTERECTOMY     CATARACT EXTRACTION W/PHACO Right 10/18/2016   Procedure: CATARACT EXTRACTION PHACO AND INTRAOCULAR LENS PLACEMENT ([place]);  Surgeon: Estill Cotta, MD;  Location: [facility];  Service: Ophthalmology;  Laterality: Right;  Lot # [id number] H [place]: 01:04.0 AP%: 24.4 CDE: 30.44   CATARACT EXTRACTION W/PHACO Left 03/14/2017   Procedure: CATARACT EXTRACTION PHACO AND INTRAOCULAR LENS PLACEMENT ([place])  Left;  Surgeon: Leandrew Koyanagi, MD;  Location: [facility];  Service: Ophthalmology;  Laterality: Left;   CEA  Carotid stenosis found during workup for dysphagia,  [place]  2009   Dr. Hoover Brunette for cervical cord stenosis, C5-6 diskectomy   CHOLECYSTECTOMY   2002   Dr. Pat Patrick   CLOSED MANIPULATION SHOULDER     left shoulder post physical therapy, Right shoulder redo (toggle bolts)   COLONOSCOPY  09/01/14   coronary angiography  01/2011   one occluded artery with collateralization, other arteries patent   CORONARY ARTERY BYPASS GRAFT  2006   s/p triple bypass surgery, [facility]   heart catherization  2016   HERNIA REPAIR  may 2011   Dr. Pat Patrick   hernia repair     LEFT HEART CATH AND CORONARY ANGIOGRAPHY N/A 06/07/2021   Procedure: LEFT HEART CATH AND CORONARY ANGIOGRAPHY;  Surgeon: Isaias Cowman, MD;  Location: [facility];  Service: Cardiovascular;  Laterality: N/A;   LUMBAR DISC SURGERY  1971   L5, unssuccessful, fusion in 1983 successful (2 lumbar)   PARTIAL HYSTERECTOMY     ROTATOR CUFF REPAIR  2002   right shoulder Dr. Nadean Corwin, [facility] in [place]      Current Outpatient Medications  Medication Sig Dispense Refill   amLODipine (NORVASC) 5 MG tablet Take 5 mg by mouth daily.     aspirin 81 MG tablet Take 81 mg by mouth daily.     cholecalciferol (VITAMIN D) 1000 UNITS tablet Take 1,000 Units by mouth daily.     isosorbide mononitrate (IMDUR) 60 MG 24 hr tablet Take 1 tablet (60 mg total) by mouth daily. 30 tablet 0   metoprolol succinate (TOPROL-XL) 50 MG 24 hr tablet Take 1 tablet (50 mg total) by mouth daily. Take with or immediately following a meal. 90 tablet 1   Multiple Vitamins-Minerals (PRESERVISION AREDS 2 PO) Take by mouth 2 (two) times daily.     omeprazole (PRILOSEC) 20 MG capsule TAKE 1 CAPSULE(20 MG) BY MOUTH DAILY 90 capsule 1   rosuvastatin (CRESTOR) 40 MG tablet Take 1 tablet (40 mg total) by mouth daily. 90 tablet 1   Turmeric (QC TUMERIC COMPLEX PO) Take 1 mg by mouth daily.     nitroGLYCERIN (NITROSTAT) 0.4 MG SL tablet Place 1 tablet (0.4 mg total) under the tongue every 5 (five) minutes as needed for chest pain. 50 tablet 4   No current facility-administered medications  for this visit.     Allergies:   Telmisartan, Niacin and related, Sertraline hcl, Sulfa drugs cross reactors, Tetracyclines & related, Tizanidine, Latex, and Simvastatin   Social History:  The patient  reports that she quit smoking about 9 years ago. Her smoking use included cigarettes. She has a 15.00 pack-year smoking history. She has never used smokeless tobacco. She reports that she does not drink alcohol and does not use drugs.   Family History:   family history includes Breast cancer in her sister; Cancer in her sister and sister; Diabetes in her mother, sister, and sister; Heart disease in her brother and mother; Other in her brother, father, and another family member.    Review of Systems: Review of Systems  Constitutional: Negative.   HENT: Negative.    Respiratory: Negative.    Cardiovascular:  Positive for chest pain.  Gastrointestinal: Negative.   Musculoskeletal: Negative.   Neurological: Negative.   Psychiatric/Behavioral: Negative.    All other systems reviewed and are negative.   PHYSICAL EXAM: VS:  BP 130/60 (BP Location: Right Arm, Patient Position: Sitting, Cuff Size: Normal)  Pulse 66    Ht 5\' 3"  (1.6 m)    Wt 152 lb 8 oz (69.2 kg)    SpO2 96%    BMI 27.01 kg/m  , BMI Body mass index is 27.01 kg/m. GEN: Well nourished, well developed, in no acute distress HEENT: normal Neck: no JVD, carotid bruits, or masses Cardiac: RRR; no murmurs, rubs, or gallops,no edema  Respiratory:  clear to auscultation bilaterally, normal work of breathing GI: soft, nontender, nondistended, + BS MS: no deformity or atrophy Skin: warm and dry, no rash Neuro:  Strength and sensation are intact Psych: euthymic mood, full affect  Recent Labs: 12/05/2021: Hemoglobin 10.5; Magnesium 1.9; Platelets 194.0; TSH 2.69 12/13/2021: ALT 14; BUN 27; Creatinine, Ser 1.11; Potassium 5.1; Sodium 141    Lipid Panel Lab Results  Component Value Date   CHOL 114 12/13/2021   HDL 36.20 (L)  12/13/2021   LDLCALC 40 12/13/2021   TRIG 191.0 (H) 12/13/2021      Wt Readings from Last 3 Encounters:  01/04/22 152 lb 8 oz (69.2 kg)  12/19/21 152 lb (68.9 kg)  12/05/21 152 lb 2 oz (69 kg)     ASSESSMENT AND PLAN:  Problem List Items Addressed This Visit       Cardiology Problems   AAA (abdominal aortic aneurysm) without rupture (HCC)   Relevant Medications   nitroGLYCERIN (NITROSTAT) 0.4 MG SL tablet   Coronary artery disease involving native coronary artery of native heart - Primary   Relevant Medications   nitroGLYCERIN (NITROSTAT) 0.4 MG SL tablet   Other Relevant Orders   EKG 12-Lead   Peripheral vascular disease (HCC)   Relevant Medications   nitroGLYCERIN (NITROSTAT) 0.4 MG SL tablet   Bilateral carotid artery stenosis   Relevant Medications   nitroGLYCERIN (NITROSTAT) 0.4 MG SL tablet   Other Relevant Orders   EKG 12-Lead     Other   Pulmonary emphysema (HCC)   Controlled type 2 diabetes with neuropathy ([place])   Coronary artery disease with stable angina Long discussion concerning prior history, CABG, recent catheterization August 2022 Coronary anatomy discussed Unstable angina symptoms improved after moving isosorbide to dinnertime.  Previously was taking all her medications in the morning Recommend she call us if symptoms recur, so far has been doing better in the past 2 weeks -Discussed possibly taking isosorbide twice a day -Other options include adding Ranexa -Images from catheterization August 2022 reviewed We will discuss with one of the interventionalists.  Possible anginal symptoms from mid left circumflex lesion Addendum: Case discussed with Dr. Fletcher Anon: "tight mid left circumflex stenosis feeding an OM branch that currently does not have a patent graft.  The lesion is heavily calcified and the vessel distally is tortuous and thus its not straightforward for PCI and will require atherectomy.  recommend maximizing antianginal therapy before  considering high risk PCI. "  Hyperlipidemia Cholesterol is at goal on the current lipid regimen. No changes to the medications were made.  Type 2 diabetes with complications Stressed importance of aggressive diabetes control, goal A1c low 6 or better She was trying to do this with diet, she will talk with Dr.Tullo  PAD History of carotid endarterectomy Will need periodic carotid ultrasound, stressed importance of aggressive diabetes control    Total encounter time more than 60 minutes  Greater than 50% was spent in counseling and coordination of care with the patient    Signed, Esmond Plants, M.D., Ph.D. [place], [place]

## 2022-01-04 NOTE — Patient Instructions (Addendum)
Research a medication called Ranexa  ?Used for chronic stable angina ? ? ?Medication Instructions:  ?No changes ?We sent in refill for your Nitroglycerin 0.4mg  ?  ?For as needed Nitroglycerin, if you develop chest pain: ?Sit and rest 5 minutes. If chest pain does not resolve place 1 nitroglycerin under your tongue and wait 5 minutes. ?If chest pain does not resolve, place a 2nd nitroglycerin under your tongue and wait 5 more minutes. ?If chest pain does not resolve, place a 3rd nitroglycerin under your tongue and seek emergency services. ? ? ?If you need a refill on your cardiac medications before your next appointment, please call your pharmacy.  ? ?Lab work: ?No new labs needed ? ?Testing/Procedures: ?No new testing needed ? ?Follow-Up: ?At [facility], you and your health needs are our priority.  As part of our continuing mission to provide you with exceptional heart care, we have created designated Provider Care Teams.  These Care Teams include your primary Cardiologist (physician) and Advanced Practice Providers (APPs -  Physician Assistants and Nurse Practitioners) who all work together to provide you with the care you need, when you need it. ? ?You will need a follow up appointment in 6 months ? ?Providers on your designated Care Team:   ?Murray Hodgkins, NP ?Christell Faith, PA-C ?Cadence Kathlen Mody, PA-C ? ?COVID-19 Vaccine Information can be found at: [email or web address] For questions related to vaccine distribution or appointments, please email [email or web address] or call [phone].  ? ?

## 2022-01-05 ENCOUNTER — Telehealth: Payer: Self-pay

## 2022-01-05 ENCOUNTER — Other Ambulatory Visit: Payer: Medicare Other

## 2022-01-05 ENCOUNTER — Encounter: Payer: Medicare Other | Attending: Internal Medicine

## 2022-01-05 DIAGNOSIS — I214 Non-ST elevation (NSTEMI) myocardial infarction: Secondary | ICD-10-CM | POA: Insufficient documentation

## 2022-01-05 NOTE — Telephone Encounter (Signed)
Called Sabrina Montoya to check in to see when she can come back to exercise. She was out due to a tooth abscess and needed to connect with her dentist.  ?

## 2022-01-08 ENCOUNTER — Telehealth: Payer: Self-pay | Admitting: Cardiovascular Disease

## 2022-01-08 NOTE — Telephone Encounter (Signed)
Prior cardiac catheterization images discussed with our interventional cardiologist (MA) ?"there is a tight mid left circumflex stenosis feeding an OM branch that currently does not have a patent graft.  The lesion is heavily calcified and the vessel distally is tortuous and thus its not straightforward for PCI and will require atherectomy.  I recommend maximizing antianginal therapy before considering high risk PCI. " ? ?With that said, she was feeling better with moving the isosorbide to the evening ?We will call us if symptoms come back, could try isosorbide twice a day, adding Ranexa ?If she has chest pain/angina that we cannot get under control, could try high risk intervention as detailed above. ?Thx ?TGollan ? ?

## 2022-01-09 ENCOUNTER — Ambulatory Visit: Payer: Medicare Other | Admitting: Cardiovascular Disease

## 2022-01-09 NOTE — Telephone Encounter (Signed)
OV 3/1, advised to review Ranexa (education provided) ?Isosorbide was moved to evening dose  ?If CP persist, pt will call back for medication adjustments could try isosorbide twice a day, adding Ranexa ?If medications does not help her symptoms of chest pain/angina that we cannot get under control, could then try high risk intervention per Dr. Rockey Situ ? ?

## 2022-01-10 ENCOUNTER — Ambulatory Visit: Payer: Self-pay | Admitting: Pharmacist

## 2022-01-10 NOTE — Chronic Care Management (AMB) (Signed)
?  Chronic Care Management  ? ?Note ? ?01/10/2022 ?Name: Sabrina Montoya MRN: [id number] DOB: June 27, 1941 ? ? ? ?Closing pharmacy CCM case at this time. Will collaborate with Care Guide to outreach to schedule follow up with RN CM. Patient has clinic contact information for future questions or concerns.  ? ?Catie Darnelle Maffucci, PharmD, [place], CPP ?Clinical Pharmacist ?Therapist, music at [employer] ?[phone] ? ?

## 2022-01-11 ENCOUNTER — Telehealth: Payer: Self-pay

## 2022-01-11 ENCOUNTER — Ambulatory Visit (INDEPENDENT_AMBULATORY_CARE_PROVIDER_SITE_OTHER): Payer: Medicare Other | Admitting: *Deleted

## 2022-01-11 ENCOUNTER — Ambulatory Visit (INDEPENDENT_AMBULATORY_CARE_PROVIDER_SITE_OTHER): Payer: Medicare Other | Admitting: Internal Medicine

## 2022-01-11 ENCOUNTER — Other Ambulatory Visit: Payer: Self-pay

## 2022-01-11 VITALS — BP 136/70 | HR 61 | Temp 97.9°F | Resp 16 | Ht 63.0 in | Wt 151.8 lb

## 2022-01-11 DIAGNOSIS — I209 Angina pectoris, unspecified: Secondary | ICD-10-CM | POA: Diagnosis not present

## 2022-01-11 DIAGNOSIS — K0889 Other specified disorders of teeth and supporting structures: Secondary | ICD-10-CM | POA: Diagnosis not present

## 2022-01-11 DIAGNOSIS — I7143 Infrarenal abdominal aortic aneurysm, without rupture: Secondary | ICD-10-CM

## 2022-01-11 DIAGNOSIS — J432 Centrilobular emphysema: Secondary | ICD-10-CM | POA: Diagnosis not present

## 2022-01-11 DIAGNOSIS — E1140 Type 2 diabetes mellitus with diabetic neuropathy, unspecified: Secondary | ICD-10-CM

## 2022-01-11 DIAGNOSIS — I6523 Occlusion and stenosis of bilateral carotid arteries: Secondary | ICD-10-CM

## 2022-01-11 DIAGNOSIS — G459 Transient cerebral ischemic attack, unspecified: Secondary | ICD-10-CM

## 2022-01-11 DIAGNOSIS — E7800 Pure hypercholesterolemia, unspecified: Secondary | ICD-10-CM | POA: Diagnosis not present

## 2022-01-11 DIAGNOSIS — T733XXA Exhaustion due to excessive exertion, initial encounter: Secondary | ICD-10-CM

## 2022-01-11 DIAGNOSIS — N1831 Chronic kidney disease, stage 3a: Secondary | ICD-10-CM

## 2022-01-11 NOTE — Assessment & Plan Note (Addendum)
Reviewed recent chest x ray with patient that suggested  by increased AP diameter,  Flattened hemidiaphragms and large lung volumes, and history of tobacco abuse.  She is asymptomatic,  ?

## 2022-01-11 NOTE — Patient Instructions (Signed)
Visit Information ? ?Thank you for taking time to visit with me today. Please don't hesitate to contact me if I can be of assistance to you before our next scheduled telephone appointment. ? ?Following are the goals we discussed today:  ?Contact LCSW directly if you have questions, need assistance, or if additional social work needs are identified ?Please call to resume Cardiac Rehabilitation Program ? ? ?If you are experiencing a Mental Health or [place] or need someone to talk to, please call the Suicide and Crisis Lifeline: 988  ? ?Patient verbalizes understanding of instructions and care plan provided today and agrees to view in [place]. Active MyChart status confirmed with patient.   ? ?No further follow up required: patient to contact this Education officer, museum with any additional community resource needs ? ?Yuvonne Lanahan, LCSW ?[place] ?[phone] ? ?

## 2022-01-11 NOTE — Patient Instructions (Signed)
Slow down and lighten up!   Doctor's orders ? ?Going for a walk every day is MUCH MORE IMPORTANT than having spotless house! ? ?See you in 6 months.   Gets labs prior to visit  ?

## 2022-01-11 NOTE — Chronic Care Management (AMB) (Signed)
?Chronic Care Management  ? ? Clinical Social Work Note ? ?01/11/2022 ?Name: Sabrina Montoya MRN: [id number] DOB: [birth year] ? ?Sabrina Montoya is a 81 y.o. year old female who is a primary care patient of Tullo, Aris Everts, MD. The CCM team was consulted to assist the patient with chronic disease management and/or care coordination needs related to: Mental Health Counseling and Resources.  ? ?Engaged with patient by telephone for follow up visit in response to provider referral for social work chronic care management and care coordination services.  ? ?Consent to Services:  ?The patient was given information about Chronic Care Management services, agreed to services, and gave verbal consent prior to initiation of services.  Please see initial visit note for detailed documentation.  ? ?Patient agreed to services and consent obtained.  ? ?Assessment: Review of patient past medical history, allergies, medications, and health status, including review of relevant consultants reports was performed today as part of a comprehensive evaluation and provision of chronic care management and care coordination services.    ? ?SDOH (Social Determinants of Health) assessments and interventions performed:   ? ?Advanced Directives Status: Not addressed in this encounter. ? ?CCM Care Plan ? ?Allergies  ?Allergen Reactions  ? Telmisartan Other (See Comments)  ?  Acute renal failure ?Renal issues  ? Niacin And Related Hives  ? Sertraline Hcl Other (See Comments)  ?  Hallucinations,    ? Sulfa Drugs Cross Reactors Nausea Only  ? Tetracyclines & Related Hives  ? Tizanidine Other (See Comments)  ?  Issues with kidney function  ? Latex Rash  ?  RAST testing was NEGATIVE  for LATEX  ? Simvastatin Rash  ?  *Antihyperlipidemics*; elevated LFT's.   ? ? ?Outpatient Encounter Medications as of 01/11/2022  ?Medication Sig  ? amLODipine (NORVASC) 5 MG tablet Take 5 mg by mouth daily.  ? aspirin 81 MG tablet Take 81 mg by mouth daily.  ? cholecalciferol (VITAMIN D)  1000 UNITS tablet Take 1,000 Units by mouth daily.  ? isosorbide mononitrate (IMDUR) 60 MG 24 hr tablet Take 1 tablet (60 mg total) by mouth daily.  ? metoprolol succinate (TOPROL-XL) 50 MG 24 hr tablet Take 1 tablet (50 mg total) by mouth daily. Take with or immediately following a meal.  ? Multiple Vitamins-Minerals (PRESERVISION AREDS 2 PO) Take by mouth 2 (two) times daily.  ? nitroGLYCERIN (NITROSTAT) 0.4 MG SL tablet Place 1 tablet (0.4 mg total) under the tongue every 5 (five) minutes as needed for chest pain.  ? omeprazole (PRILOSEC) 20 MG capsule TAKE 1 CAPSULE(20 MG) BY MOUTH DAILY  ? rosuvastatin (CRESTOR) 40 MG tablet Take 1 tablet (40 mg total) by mouth daily.  ? Turmeric (QC TUMERIC COMPLEX PO) Take 1 mg by mouth daily.  ? ?No facility-administered encounter medications on file as of 01/11/2022.  ? ? ?Patient Active Problem List  ? Diagnosis Date Noted  ? Angina pectoris ([place]) 12/19/2021  ? Pulmonary emphysema ([place]) 12/06/2021  ? Abnormal chest x-ray 12/06/2021  ? History of COVID-19 12/06/2021  ? Orthostatic hypotension 12/05/2021  ? COVID-19 08/29/2021  ? Coronary artery disease involving native coronary artery of native heart 06/06/2021  ? 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 Extensor tenosynovitis of right wrist 05/05/2021  ? Pain, dental 03/17/2021  ? Chronic thumb pain, right 05/15/2020  ? Syncope 12/31/2019  ? Cervical radiculitis 09/14/2019  ? Arrhythmia, sinus node 05/29/2019  ? Fatigue 06/07/2018  ? Insomnia  due to anxiety and fear 04/20/2018  ? Chronic bladder pain 12/23/2017  ? Irritable bowel syndrome (IBS) 12/23/2017  ? Atrophic vaginitis 02/11/2017  ? Decreased GFR 08/24/2016  ? Dysphagia, pharyngoesophageal phase 06/20/2016  ? Sciatica, left side 06/20/2016  ? Controlled type 2 diabetes with neuropathy ([place]) 05/16/2016  ? Preventative health care 02/06/2016  ? Cervicalgia 01/09/2015  ? Cystocele 08/29/2014  ? Anemia 05/02/2014  ? TIA  (transient ischemic attack) 09/11/2013  ? Overweight (BMI 25.0-29.9) 03/16/2013  ? Bilateral carotid artery stenosis 03/03/2013  ? 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 Chronic reflux esophagitis 12/29/2012  ? Right hip pain 12/29/2012  ? History of tobacco abuse 07/03/2012  ? Bradycardia 06/01/2012  ? AAA (abdominal aortic aneurysm) without rupture ([place])   ? CAD (coronary artery disease), autologous vein bypass graft   ? Peripheral vascular disease ([place])   ? Arthritis 02/29/2012  ? Hyperlipidemia   ? Osteopenia 08/29/2011  ? Vitamin D deficiency 08/29/2011  ? ? ?Conditions to be addressed/monitored: CAD, DMII, Anxiety, and Depression; Mental Health Concerns  ? ?Care Plan : LCSW Plan of Care  ?Updates made by Vern Claude, LCSW since 01/11/2022 12:00 AM  ?  ? ?Problem: Reduce and Manage My Symptoms of Anxiety and Stress.   ?Priority: High  ?  ? ?Long-Range Goal: Reduce and Manage My Symptoms of Anxiety and Stress.   ?Start Date: 07/21/2021  ?Expected End Date: 01/18/2022  ?Recent Progress: On track  ?Priority: High  ?Note:   ?Current Barriers:   ?Acute Mental Health needs related to Coronary Artery Disease Involving Native Coronary Artery of Native Heart, Stage II Chronic Kidney Disease, Type 2 Diabetes Mellitus with Chronic Kidney Disease, Peripheral Vascular Disease, Pure Hypercholesterolemia, Anxiety and Stress of Chronic Illness requires Support, Education, Resources, Referrals and Care Coordination in order to meet unmet mental health needs. ?Clinical Goal(s):  ?Patient will work with LCSW to reduce and manage symptoms of Anxiety and Stress, until manageable.    ?Patient will increase knowledge and/or ability of:  Coping Skills, Self-Management Skills and Stress Reduction.     ?Clinical Interventions:  ?Emotional Support Provided, Cognitive Behavioral Therapy Initiated, and Verbalization of Feelings Encouraged.   ?Collaboration with Primary Care Physician, Dr. Deborra Medina regarding development  and update of comprehensive plan of care as evidenced by provider attestation and co-signature. ?Patient denied symptoms of anxiety and stress at this time, confirming feeling more relaxed after obtaining a better explanation of her nightly chest pain. Per patient, medications were adjusted, symptoms of chest pain has greatly improved.  ?Follow up with cardiac rehab discussed, as per patient she has missed a few visits ?Patient confirms a positive network of family and friends-has a Building services engineer and a family friend that assists with household chores ?Patient maintains plan to slowly work back up to her previous activities-safety emphasized with discussion around modifying activities ?Verbalization of feelings encouraged, patient provided with emotional support  ?Use of positive coping strategies reinforced ?Patient Goals/Self-Care Activities:     ?Confirmed plan to resume Cardiac Rehabilitation appointments  ?Contact LCSW directly if you have questions, need assistance, or if additional social work needs are identified    ?  ? ? ?  ?  ? ?Follow Up Plan: Client will contact this social worker with any additional community resource needs. ?     ?[employer], LCSW ?[place] ?[phone] ? ? ? ?

## 2022-01-11 NOTE — Assessment & Plan Note (Signed)
Repeat imaging in 2023 notes infrarenal measurements of 3.6 on CT.  3.9 on renal artery doppler.  2 yr follow up imaging advised .  Continue metoprolol with dose reduced to 50 mg today due to excessive fatigue and low normal BP readings/pulse on home measurement. Advised to monitor BP at home with goal 120/70 or less  ?

## 2022-01-11 NOTE — Assessment & Plan Note (Signed)
Resolved with splitting of Imdur dose to dinnertime and bedtime per Dr Rockey Situ ?

## 2022-01-11 NOTE — Assessment & Plan Note (Signed)
Secondary to abscessed tooth.  Root canal planned by [facility].  Finished antibiotics. Taking probiotics ?

## 2022-01-11 NOTE — Assessment & Plan Note (Signed)
A1c is stable  and she nas new onset microalbuminuria. She has declined trial of SGLT 2inhibitor .Given her history of hypotension ,  Will postpone addition of  ARB  Until next visit. Patient is up-to-date on eye exams . continue asa and crestor. ? ?Lab Results  ?Component Value Date  ? HGBA1C 7.0 (H) 12/13/2021  ? ?Lab Results  ?Component Value Date  ? MICROALBUR 2.5 (H) 04/27/2021  ? ?Lab Results  ?Component Value Date  ? CREATININE 1.11 12/13/2021  ? ? ?

## 2022-01-11 NOTE — Chronic Care Management (AMB) (Signed)
?  Care Management  ? ?Note ? ?01/11/2022 ?Name: Sabrina Montoya MRN: [id number] DOB: Mar 17, 1941 ? ?Sabrina Montoya is a 81 y.o. year old female who is a primary care patient of Crecencio Mc, MD and is actively engaged with the care management team. I reached out to Wynona Neat by phone today to assist with scheduling an initial visit with the RN Case Manager ? ?Follow up plan: ?Unsuccessful telephone outreach attempt made. A HIPAA compliant phone message was left for the patient providing contact information and requesting a return call.  ?The care management team will reach out to the patient again over the next 5 days.  ?If patient returns call to provider office, please advise to call [place]  at [phone] ? ?Noreene Larsson, RMA ?Care Guide, Embedded Care Coordination ?[facility]  [facility]  ?[place], [place] [address] ?Direct Dial: [phone] ?Museum/[email or web address] ?Website: [email or web address]  ? ?

## 2022-01-11 NOTE — Progress Notes (Signed)
Subjective:  Patient ID: Sabrina Montoya, female    DOB: 1941-06-19  Age: 81 y.o. MRN: [id number]  CC: The primary encounter diagnosis was Controlled type 2 diabetes with neuropathy ([place]). Diagnoses of Centrilobular emphysema ([place]), Pure hypercholesterolemia, Angina pectoris ([place]), Pain, dental, and Infrarenal abdominal aortic aneurysm (AAA) without rupture were also pertinent to this visit.   This visit occurred during the SARS-CoV-2 public health emergency.  Safety protocols were in place, including screening questions prior to the visit, additional usage of staff PPE, and extensive cleaning of exam room while observing appropriate contact time as indicated for disinfecting solutions.    HPI Sabrina Montoya presents for  Chief Complaint  Patient presents with   Hyperlipidemia   Hypertension    Follow up on chronic issues  Low back pain by the end of the day.   Doing too much.  Feels like she is Not getting enough done during the day .  Was going to too many doctor's appointments  Has an abscessed tooth under her bridge.   Root canal planned by [facility] .  Took antibiotics  and probiotics .  Frequent stools but not watery .   "HOw serious are my health problems"  Do I have emphysema? Long discussion today about these issues which included review of alast cardiac cath,  cardiologist notes, ND RECENT CHEST X RAY ORDERED BY ANOTHER PROVIDER  T2DM:  discussed SGLT inhibitors and ARB does not want to start new medications at this time due to side effects and recent hypotension finally resolved. Wants to return to exercising regularly  and review in 6 months    Outpatient Medications Prior to Visit  Medication Sig Dispense Refill   amLODipine (NORVASC) 5 MG tablet Take 5 mg by mouth daily.     aspirin 81 MG tablet Take 81 mg by mouth daily.     cholecalciferol (VITAMIN D) 1000 UNITS tablet Take 1,000 Units by mouth daily.     isosorbide mononitrate (IMDUR) 60 MG 24 hr tablet Take 1 tablet (60 mg  total) by mouth daily. 30 tablet 0   metoprolol succinate (TOPROL-XL) 50 MG 24 hr tablet Take 1 tablet (50 mg total) by mouth daily. Take with or immediately following a meal. 90 tablet 1   Multiple Vitamins-Minerals (PRESERVISION AREDS 2 PO) Take by mouth 2 (two) times daily.     nitroGLYCERIN (NITROSTAT) 0.4 MG SL tablet Place 1 tablet (0.4 mg total) under the tongue every 5 (five) minutes as needed for chest pain. 50 tablet 4   omeprazole (PRILOSEC) 20 MG capsule TAKE 1 CAPSULE(20 MG) BY MOUTH DAILY 90 capsule 1   rosuvastatin (CRESTOR) 40 MG tablet Take 1 tablet (40 mg total) by mouth daily. 90 tablet 1   Turmeric (QC TUMERIC COMPLEX PO) Take 1 mg by mouth daily.     No facility-administered medications prior to visit.    Review of Systems;  Patient denies headache, fevers, malaise, unintentional weight loss, skin rash, eye pain, sinus congestion and sinus pain, sore throat, dysphagia,  hemoptysis , cough, dyspnea, wheezing, chest pain, palpitations, orthopnea, edema, abdominal pain, nausea, melena, diarrhea, constipation, flank pain, dysuria, hematuria, urinary  Frequency, nocturia, numbness, tingling, seizures,  Focal weakness, Loss of consciousness,  Tremor, insomnia, depression, anxiety, and suicidal ideation.      Objective:  BP 136/70    Pulse 61    Temp 97.9 F (36.6 C)    Resp 16    Ht '5\' 3"'$  (1.6 m)  Wt 151 lb 12.8 oz (68.9 kg)    SpO2 98%    BMI 26.89 kg/m   BP Readings from Last 3 Encounters:  01/11/22 136/70  01/04/22 130/60  12/19/21 (!) 112/54    Wt Readings from Last 3 Encounters:  01/11/22 151 lb 12.8 oz (68.9 kg)  01/04/22 152 lb 8 oz (69.2 kg)  12/19/21 152 lb (68.9 kg)    General appearance: alert, cooperative and appears stated age Ears: normal TM's and external ear canals both ears Throat: lips, mucosa, and tongue normal; teeth and gums normal Neck: no adenopathy, no carotid bruit, supple, symmetrical, trachea midline and thyroid not enlarged, symmetric,  no tenderness/mass/nodules Back: symmetric, no curvature. ROM normal. No CVA tenderness. Lungs: clear to auscultation bilaterally Heart: regular rate and rhythm, S1, S2 normal, no murmur, click, rub or gallop Abdomen: soft, non-tender; bowel sounds normal; no masses,  no organomegaly Pulses: 2+ and symmetric Skin: Skin color, texture, turgor normal. No rashes or lesions Lymph nodes: Cervical, supraclavicular, and axillary nodes normal.  Lab Results  Component Value Date   HGBA1C 7.0 (H) 12/13/2021   HGBA1C 7.0 (H) 06/06/2021   HGBA1C 7.3 (H) 04/27/2021    Lab Results  Component Value Date   CREATININE 1.11 12/13/2021   CREATININE 1.30 (H) 12/05/2021   CREATININE 1.36 (H) 08/18/2021    Lab Results  Component Value Date   WBC 5.0 12/05/2021   HGB 10.5 (L) 12/05/2021   HCT 31.0 (L) 12/05/2021   PLT 194.0 12/05/2021   GLUCOSE 123 (H) 12/13/2021   CHOL 114 12/13/2021   TRIG 191.0 (H) 12/13/2021   HDL 36.20 (L) 12/13/2021   LDLDIRECT 48.0 04/18/2018   LDLCALC 40 12/13/2021   ALT 14 12/13/2021   AST 31 12/13/2021   NA 141 12/13/2021   K 5.1 12/13/2021   CL 105 12/13/2021   CREATININE 1.11 12/13/2021   BUN 27 (H) 12/13/2021   CO2 30 12/13/2021   TSH 2.69 12/05/2021   INR 1.1 06/06/2021   HGBA1C 7.0 (H) 12/13/2021   MICROALBUR 2.5 (H) 04/27/2021    US Renal Artery Stenosis  Result Date: 12/19/2021 CLINICAL DATA:  81 year old female with a history of PA D and decreasing GFR EXAM: RENAL/URINARY TRACT ULTRASOUND RENAL DUPLEX DOPPLER ULTRASOUND COMPARISON:  CT 08/15/2017 FINDINGS: Right Kidney: Length: 10.1 cm. Echogenicity within normal limits. No hydronephrosis. Cyst on the interpolar cortex measuring 3.6 cm. Left Kidney: Length: 10.4 cm. Echogenicity within normal limits. No hydronephrosis. Anechoic cyst on the medial cortex measuring 2.6 cm. Bladder:  Unremarkable RENAL DUPLEX ULTRASOUND Right Renal Artery Velocities: Origin:  136 cm/sec Mid:  93 cm/sec Hilum:  98 cm/sec  Interlobar:  73 cm/sec Arcuate:  46 cm/sec Left Renal Artery Velocities: Origin: Not visualized Mid:  60 cm/sec Hilum:  92 cm/sec Interlobar:  49 cm/sec Arcuate:  31 cm/sec Aortic Velocity:  62 cm/sec Right Renal-Aortic Ratios: Origin: 2.2 Mid:  1.5 Hilum: 1.6 Interlobar: 1.2 Arcuate: 0.7 Left Renal-Aortic Ratios: Origin: N/a Mid: 1.0 Hilum: 1.5 Interlobar: 0.8 Arcuate: 0.5 Additional: Abdominal aortic aneurysm measuring 3.9 cm, previously 3.8 cm on CT dated 08/15/2017 IMPRESSION: Directed duplex of the bilateral renal arteries demonstrates no evidence of high-grade stenosis. Redemonstration abdominal aortic aneurysm, 3.9 cm on the current duplex, previously 3.8 cm. Recommend follow-up every 2 years. This recommendation follows ACR consensus guidelines: White Paper of the ACR Incidental Findings Committee II on Vascular Findings. J Am Coll Radiol 2013; 10:789-794. Aortic aneurysm NOS (ICD10-I71.9). Signed, Dulcy Fanny. Earleen Newport DO, RPVI [facility] Electronically Signed   By: Corrie Mckusick D.O.   On: 12/19/2021 10:17    Assessment & Plan:   Problem List Items Addressed This Visit     AAA (abdominal aortic aneurysm) without rupture ([place])    Repeat imaging in 2023 notes infrarenal measurements of 3.6 on CT.  3.9 on renal artery doppler.  2 yr follow up imaging advised .  Continue metoprolol with dose reduced to 50 mg today due to excessive fatigue and low normal BP readings/pulse on home measurement. Advised to monitor BP at home with goal 120/70 or less       Angina pectoris ([facility])    Resolved with splitting of Imdur dose to dinnertime and bedtime per Dr Rockey Situ      Controlled type 2 diabetes with neuropathy ([place]) - Primary    A1c is stable  and she nas new onset microalbuminuria. She has declined trial of SGLT 2inhibitor .Given her history of hypotension ,  Will postpone addition of  ARB  Until next visit. Patient is up-to-date on eye exams .  continue asa and crestor.  Lab Results  Component Value Date   HGBA1C 7.0 (H) 12/13/2021   Lab Results  Component Value Date   MICROALBUR 2.5 (H) 04/27/2021   Lab Results  Component Value Date   CREATININE 1.11 12/13/2021         Relevant Orders   Hemoglobin A1c   Comprehensive metabolic panel   Urine Microalbumin w/creat. ratio   Hyperlipidemia   Relevant Orders   Lipid panel   Pain, dental    Secondary to abscessed tooth.  Root canal planned by [facility].  Finished antibiotics. Taking probiotics      Pulmonary emphysema ([place])    Reviewed recent chest x ray with patient that suggested  by increased AP diameter,  Flattened hemidiaphragms and large lung volumes, and history of tobacco abuse.  She is asymptomatic,        I spent 40 minutes dedicated to the care of this patient on the date of this encounter to include pre-visit review of patient's medical history,  most recent imaging studies, Face-to-face time with the patient , and post visit ordering of testing and therapeutics.    Follow-up: No follow-ups on file.   Crecencio Mc, MD

## 2022-01-12 ENCOUNTER — Telehealth: Payer: Self-pay | Admitting: *Deleted

## 2022-01-12 ENCOUNTER — Other Ambulatory Visit: Payer: Self-pay

## 2022-01-12 NOTE — Telephone Encounter (Signed)
Called to check on pt-  Left message.

## 2022-01-12 NOTE — Chronic Care Management (AMB) (Signed)
?  Chronic Care Management  ? ?Note ? ?01/12/2022 ?Name: Sabrina Montoya MRN: [id number] DOB: 17-Jun-1941 ? ?Sabrina Montoya is a 81 y.o. year old female who is a primary care patient of Crecencio Mc, MD. Sabrina Montoya is currently enrolled in care management services. An additional referral for RNCM was placed.  ? ?Follow up plan: ?Patient declines further follow up and engagement by the care management team. Appropriate care team members and provider have been notified via electronic communication.  ? ?Noreene Larsson, RMA ?Care Guide, Embedded Care Coordination ?[facility]  [facility]  ?[place], [place] [address] ?Direct Dial: [phone] ?Museum/gallery [email or web address] ?Website: [email or web address]  ? ?

## 2022-01-17 ENCOUNTER — Other Ambulatory Visit: Payer: Self-pay

## 2022-01-17 ENCOUNTER — Telehealth: Payer: Self-pay | Admitting: Internal Medicine

## 2022-01-17 ENCOUNTER — Encounter: Payer: Self-pay | Admitting: Adult Health

## 2022-01-17 ENCOUNTER — Ambulatory Visit (INDEPENDENT_AMBULATORY_CARE_PROVIDER_SITE_OTHER): Payer: Medicare Other | Admitting: Adult Health

## 2022-01-17 VITALS — BP 126/76 | HR 68 | Temp 98.0°F | Ht 63.0 in | Wt 151.0 lb

## 2022-01-17 DIAGNOSIS — K047 Periapical abscess without sinus: Secondary | ICD-10-CM | POA: Diagnosis not present

## 2022-01-17 DIAGNOSIS — H6501 Acute serous otitis media, right ear: Secondary | ICD-10-CM | POA: Diagnosis not present

## 2022-01-17 HISTORY — DX: Periapical abscess without sinus: K04.7

## 2022-01-17 LAB — CBC WITH DIFFERENTIAL/PLATELET
Basophils Absolute: 0 10*3/uL (ref 0.0–0.1)
Basophils Relative: 0.4 % (ref 0.0–3.0)
Eosinophils Absolute: 0.1 10*3/uL (ref 0.0–0.7)
Eosinophils Relative: 2.6 % (ref 0.0–5.0)
HCT: 31.8 % — ABNORMAL LOW (ref 36.0–46.0)
Hemoglobin: 10.9 g/dL — ABNORMAL LOW (ref 12.0–15.0)
Lymphocytes Relative: 27.5 % (ref 12.0–46.0)
Lymphs Abs: 1.1 10*3/uL (ref 0.7–4.0)
MCHC: 34.4 g/dL (ref 30.0–36.0)
MCV: 84.8 fl (ref 78.0–100.0)
Monocytes Absolute: 0.3 10*3/uL (ref 0.1–1.0)
Monocytes Relative: 8.1 % (ref 3.0–12.0)
Neutro Abs: 2.5 10*3/uL (ref 1.4–7.7)
Neutrophils Relative %: 61.4 % (ref 43.0–77.0)
Platelets: 178 10*3/uL (ref 150.0–400.0)
RBC: 3.75 Mil/uL — ABNORMAL LOW (ref 3.87–5.11)
RDW: 14.4 % (ref 11.5–15.5)
WBC: 4.1 10*3/uL (ref 4.0–10.5)

## 2022-01-17 MED ORDER — AMOXICILLIN-POT CLAVULANATE 875-125 MG PO TABS: 1.0000 | ORAL_TABLET | Freq: Two times a day (BID) | ORAL | 0 refills | Status: DC

## 2022-01-17 NOTE — Telephone Encounter (Signed)
Pt called in stating since yesterday she has had shooting pains in the back of her head and it is getting worse. Pt stated it started on the top of her right ear and shoots up to the top of her head. Pt stated her head is sore. Sent to access nurse ?

## 2022-01-17 NOTE — Telephone Encounter (Signed)
Pt was seen by Laverna Peace, NP today.  ?

## 2022-01-17 NOTE — Progress Notes (Addendum)
Acute Office Visit  Subjective:    Patient ID: Sabrina Montoya, female    DOB: 07/26/1941, 81 y.o.   MRN: [id number]  Chief Complaint  Patient presents with   Follow-up    Pt c/o pain in head - pt states pain starts at the area of the the ear, and feels like it shoots to the center top part of her head at times but seems to be mostly ear pain today patient reports. Pt also stated sometimes feels pain in the inner part of her ear. Started last Friday.     HPI Accompanied by her daughter Marcelino Duster today.  Patient is in today for a pain in her right ear, she has felt the pain from her ear to the top of her head at times, she feels the pain originate from her ear as a fast electric like sensation. The pain seems to have settled in her right ear today and feels like a piercing pain inside her ear. Denies any ear drainage or trauma.   Onset of symptoms were 01/13/22 started with gradual RIGHT ear pain and continued to worsen as the weekend went on.  Denies any other recent illness or respiratory symptoms.  Denies any headache.  Denies any pulsating.  Denies any headache, visual changes, speech changes or falls. Denies any trauma.  Denies any fever  or chills.   Incidently she was treated with amoxicillin 3 weeks ago by her dentist for an abscess tooth right lower back molar. Se finished. Her back tooth right side is abscessed. April 19 th she is scheduled for root canal with Dr. Delphina Cahill.   Patient  denies any fever, body aches,chills, rash, chest pain, shortness of breath, nausea, vomiting, or diarrhea.  Denies dizziness, lightheadedness, pre syncopal or syncopal episodes.     Past Medical History:  Diagnosis Date   Abdominal aortic aneurysm without mention of rupture    infrarenal, stable, folllowed by Festus Barren   Acute posthemorrhagic anemia    Arthritis    B12 deficiency    CAD (coronary artery disease), autologous vein bypass graft    Cardiac dysrhythmia, unspecified    Colon polyp     Depression    Diverticulitis of colon    Dizziness    DUE TO MEDICINES   Dysrhythmia    GERD (gastroesophageal reflux disease)    HOH (hard of hearing)    Hyperlipidemia    Hypertension    pt denies. placed on meds after CABG   IBS (irritable bowel syndrome)    Neuropathy    Neuropathy 07/01/2013   Peripheral vascular disease (HCC)    s/p CEA    Reflux esophagitis    Rheumatic fever    possible at age 42   Sliding hiatal hernia    Tobacco abuse    Tobacco abuse     Past Surgical History:  Procedure Laterality Date   ABDOMINAL HYSTERECTOMY     ABDOMINAL SURGERY  1976   for pain secondary to scar tissue, s/p apply   APPENDECTOMY  1974   BACK SURGERY     CAROTID ENDARTERECTOMY     CATARACT EXTRACTION W/PHACO Right 10/18/2016   Procedure: CATARACT EXTRACTION PHACO AND INTRAOCULAR LENS PLACEMENT (IOC);  Surgeon: Sallee Lange, MD;  Location: [facility];  Service: Ophthalmology;  Laterality: Right;  Lot # [id number] H [place]: 01:04.0 AP%: 24.4 CDE: 30.44   CATARACT EXTRACTION W/PHACO Left 03/14/2017   Procedure: CATARACT EXTRACTION PHACO AND INTRAOCULAR LENS PLACEMENT (IOC)  Left;  Surgeon:  Lockie Mola, MD;  Location: 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 Michela Pitcher   CLOSED MANIPULATION SHOULDER     left shoulder post physical therapy, Right shoulder redo (toggle bolts)   COLONOSCOPY  09/01/14   coronary angiography  01/2011   one occluded artery with collateralization, other arteries patent   CORONARY ARTERY BYPASS GRAFT  2006   s/p triple bypass surgery, [facility]   heart catherization  2016   HERNIA REPAIR  may 2011   Dr. Michela Pitcher   hernia repair     LEFT HEART CATH AND CORONARY ANGIOGRAPHY N/A 06/07/2021   Procedure: LEFT HEART CATH AND CORONARY  ANGIOGRAPHY;  Surgeon: Marcina Millard, MD;  Location: [facility];  Service: Cardiovascular;  Laterality: N/A;   LUMBAR DISC SURGERY  1971   L5, unssuccessful, fusion in 1983 successful (2 lumbar)   PARTIAL HYSTERECTOMY     ROTATOR CUFF REPAIR  2002   right shoulder Dr. Vernell Leep, [facility] in [facility]   VASCULAR SURGERY      Family History  Problem Relation Age of Onset   Diabetes Mother    Heart disease Mother    Other Father        suicide   Cancer Sister        cervical cancer   Diabetes Sister    Diabetes Sister    Cancer Sister        breast   Breast cancer Sister    Heart disease Brother        coronary artery disease   Other Brother        suicide   Other Other        colon resection due to inflammation -nephew   Bladder Cancer Neg Hx    Kidney cancer Neg Hx     Social History   Socioeconomic History   Marital status: Widowed    Spouse name: Not on file   Number of children: 2   Years of education: 93   Highest education level: 12th grade  Occupational History   Not on file  Tobacco Use   Smoking status: Former    Packs/day: 0.50    Years: 30.00    Pack years: 15.00    Types: Cigarettes    Quit date: 04/01/2012    Years since quitting: 9.8   Smokeless tobacco: Never   Tobacco comments:    quit for 2 years after sinus infection and 6 months after heart surgery  Vaping Use   Vaping Use: Never used  Substance and Sexual Activity   Alcohol use: No   Drug use: No   Sexual activity: Not Currently  Other Topics Concern   Not on file  Social History Narrative   Widowed   Social Determinants of Health   Financial Resource Strain: Low Risk    Difficulty of Paying Living Expenses: Not hard at all  Food Insecurity: No Food Insecurity   Worried About Programme researcher, broadcasting/film/video in the Last Year: Never true   Barista in the Last Year: Never true  Transportation Needs: No Transportation Needs   Lack of Transportation (Medical): No    Lack of Transportation (Non-Medical): No  Physical Activity: Inactive   Days of  Exercise per Week: 0 days   Minutes of Exercise per Session: 0 min  Stress: Stress Concern Present   Feeling of Stress : Very much  Social Connections: Moderately Isolated   Frequency of Communication with Friends and Family: More than three times a week   Frequency of Social Gatherings with Friends and Family: More than three times a week   Attends Religious Services: 1 to 4 times per year   Active Member of [employer] or Organizations: No   Attends Banker Meetings: Never   Marital Status: Widowed  Catering manager Violence: Not At Risk   Fear of Current or Ex-Partner: No   Emotionally Abused: No   Physically Abused: No   Sexually Abused: No    Outpatient Medications Prior to Visit  Medication Sig Dispense Refill   amLODipine (NORVASC) 5 MG tablet Take 5 mg by mouth daily.     aspirin 81 MG tablet Take 81 mg by mouth daily.     cholecalciferol (VITAMIN D) 1000 UNITS tablet Take 1,000 Units by mouth daily.     isosorbide mononitrate (IMDUR) 60 MG 24 hr tablet Take 1 tablet (60 mg total) by mouth daily. 30 tablet 0   metoprolol succinate (TOPROL-XL) 50 MG 24 hr tablet Take 1 tablet (50 mg total) by mouth daily. Take with or immediately following a meal. 90 tablet 1   Multiple Vitamins-Minerals (PRESERVISION AREDS 2 PO) Take by mouth 2 (two) times daily.     nitroGLYCERIN (NITROSTAT) 0.4 MG SL tablet Place 1 tablet (0.4 mg total) under the tongue every 5 (five) minutes as needed for chest pain. 50 tablet 4   omeprazole (PRILOSEC) 20 MG capsule TAKE 1 CAPSULE(20 MG) BY MOUTH DAILY 90 capsule 1   rosuvastatin (CRESTOR) 40 MG tablet Take 1 tablet (40 mg total) by mouth daily. 90 tablet 1   Turmeric (QC TUMERIC COMPLEX PO) Take 1 mg by mouth daily.     No facility-administered medications prior to visit.    Allergies  Allergen Reactions   Telmisartan Other (See Comments)    Acute renal  failure Renal issues   Niacin And Related Hives   Sertraline Hcl Other (See Comments)    Hallucinations,     Sulfa Drugs Cross Reactors Nausea Only   Tetracyclines & Related Hives   Tizanidine Other (See Comments)    Issues with kidney function   Latex Rash    RAST testing was NEGATIVE  for LATEX   Simvastatin Rash    *Antihyperlipidemics*; elevated LFT's.     Review of Systems  Constitutional: Negative.   HENT:  Positive for dental problem and ear pain. Negative for congestion, drooling, ear discharge, facial swelling, hearing loss, mouth sores, nosebleeds, postnasal drip, rhinorrhea, sinus pressure, sinus pain, sneezing, sore throat, tinnitus, trouble swallowing and voice change.   Respiratory: Negative.    Cardiovascular: Negative.   Gastrointestinal: Negative.   Genitourinary: Negative.   Musculoskeletal: Negative.   Skin: Negative.   Neurological:  Negative for dizziness, tremors, seizures, syncope, facial asymmetry, speech difficulty, weakness, light-headedness, numbness and headaches.  Psychiatric/Behavioral: Negative.        Objective:    Physical Exam Vitals reviewed.  Constitutional:      General: She is not in acute distress.    Appearance: Normal appearance. She is obese. She is not ill-appearing, toxic-appearing or diaphoretic.  HENT:     Head: Normocephalic and atraumatic.     Jaw: There is normal jaw occlusion.     Salivary Glands:  Right salivary gland is not diffusely enlarged or tender. Left salivary gland is not diffusely enlarged or tender.     Right Ear: Hearing, ear canal and external ear normal. No drainage, swelling or tenderness. A middle ear effusion (dark brown fluid behind intact bulging erythematous tympanic membrane) is present. No mastoid tenderness. Tympanic membrane is erythematous and bulging. Tympanic membrane is not perforated or retracted.     Left Ear: Hearing, ear canal and external ear normal. No drainage, swelling or tenderness. A middle  ear effusion (clear fluid behind intact tympanic membrane) is present. No mastoid tenderness. Tympanic membrane is not perforated, erythematous, retracted or bulging.     Nose: Nose normal. No congestion or rhinorrhea.     Mouth/Throat:     Mouth: Mucous membranes are moist.     Pharynx: Oropharynx is clear. No oropharyngeal exudate or posterior oropharyngeal erythema.     Tonsils: No tonsillar exudate.      Comments: Gum line has erythema around back lower molar, no pockets seen.  Eyes:     General: No scleral icterus.       Right eye: No discharge.        Left eye: No discharge.     Extraocular Movements: Extraocular movements intact.     Conjunctiva/sclera: Conjunctivae normal.     Pupils: Pupils are equal, round, and reactive to light.  Cardiovascular:     Rate and Rhythm: Normal rate and regular rhythm.     Pulses: Normal pulses.     Heart sounds: Normal heart sounds. No murmur heard.   No friction rub. No gallop.  Pulmonary:     Effort: Pulmonary effort is normal. No respiratory distress.     Breath sounds: Normal breath sounds. No stridor. No wheezing, rhonchi or rales.  Chest:     Chest wall: No tenderness.  Abdominal:     General: There is no distension.     Palpations: Abdomen is soft.     Tenderness: There is no abdominal tenderness.  Musculoskeletal:     Cervical back: Normal range of motion and neck supple.  Lymphadenopathy:     Cervical: Cervical adenopathy (shotty right anterior cervical nodes.) present.  Neurological:     General: No focal deficit present.     Mental Status: She is alert.     GCS: GCS eye subscore is 4. GCS verbal subscore is 5. GCS motor subscore is 6.     Cranial Nerves: Cranial nerves 2-12 are intact.     Sensory: Sensation is intact.     Motor: Motor function is intact.     Coordination: Coordination is intact.     Gait: Gait is intact.     Deep Tendon Reflexes: Reflexes are normal and symmetric.     Comments: Neuro: CNs intact.  DTRs  intact .  cerebellar function intact. Negative Romberg.  Negative for pronator drift .      BP 126/76 (BP Location: Left Arm, Patient Position: Sitting, Cuff Size: Small)   Pulse 68   Temp 98 F (36.7 C) (Oral)   Ht 5\' 3"  (1.6 m)   Wt 151 lb (68.5 kg)   SpO2 98%   BMI 26.75 kg/m  Wt Readings from Last 3 Encounters:  01/17/22 151 lb (68.5 kg)  01/11/22 151 lb 12.8 oz (68.9 kg)  01/04/22 152 lb 8 oz (69.2 kg)    There are no preventive care reminders to display for this patient.  There are no preventive care reminders to display for  this patient.   Lab Results  Component Value Date   TSH 2.69 12/05/2021   Lab Results  Component Value Date   WBC 5.0 12/05/2021   HGB 10.5 (L) 12/05/2021   HCT 31.0 (L) 12/05/2021   MCV 85.3 12/05/2021   PLT 194.0 12/05/2021   Lab Results  Component Value Date   NA 141 12/13/2021   K 5.1 12/13/2021   CO2 30 12/13/2021   GLUCOSE 123 (H) 12/13/2021   BUN 27 (H) 12/13/2021   CREATININE 1.11 12/13/2021   BILITOT 0.4 12/13/2021   ALKPHOS 60 12/13/2021   AST 31 12/13/2021   ALT 14 12/13/2021   PROT 6.8 12/13/2021   ALBUMIN 4.5 12/13/2021   CALCIUM 9.8 12/13/2021   ANIONGAP 4 (L) 06/08/2021   GFR 47.26 (L) 12/13/2021   Lab Results  Component Value Date   CHOL 114 12/13/2021   Lab Results  Component Value Date   HDL 36.20 (L) 12/13/2021   Lab Results  Component Value Date   LDLCALC 40 12/13/2021   Lab Results  Component Value Date   TRIG 191.0 (H) 12/13/2021   Lab Results  Component Value Date   CHOLHDL 3 12/13/2021   Lab Results  Component Value Date   HGBA1C 7.0 (H) 12/13/2021       Assessment & Plan:   Problem List Items Addressed This Visit       Digestive   Dental infection     Nervous and Auditory   Non-recurrent acute serous otitis media of right ear - Primary   Relevant Medications   amoxicillin-clavulanate (AUGMENTIN) 875-125 MG tablet   Other Relevant Orders   CBC with Differential/Platelet     Orders Placed This Encounter  Procedures   CBC with Differential/Platelet    Discussed with patient that her right ear is infected and bulging, will start Augmentin as below.  Patient advised that this antibiotic can cause C. difficile, she will try probiotics and yogurt while she is on the antibiotic and at least 1 week afterwards. Patient will call if any symptoms are changing or worsening and she is advised of red flag symptoms and when to seek emergency care immediately. Neurologically she is intact, she has no headache today.  Pain is originating mostly from ear today, however she does have a abscessed tooth that cannot be fixed until April.  She is advised to call her dentist or oral surgeon Dr. Delphina Cahill to see if she can get in earlier as well as some of this nerve pain could be coming from that. Red flags discussed with patient.  Meds ordered this encounter  Medications   amoxicillin-clavulanate (AUGMENTIN) 875-125 MG tablet    Sig: Take 1 tablet by mouth 2 (two) times daily.    Dispense:  20 tablet    Refill:  0  Advised that we will recheck her ear at the end of the week to see how she is feeling and proceed with further evaluation if needed at that time, if she is to worsen she will call the office at any time. Return in about 3 days (around 01/20/2022), or if symptoms worsen or fail to improve, for at any time for any worsening symptoms, Go to Emergency room/ urgent care if worse.   Jairo Ben, FNP

## 2022-01-17 NOTE — Patient Instructions (Signed)
Amoxicillin; Clavulanic Acid Tablets ?What is this medication? ?AMOXICILLIN; CLAVULANIC ACID (a mox i SIL in; KLAV yoo lan ic AS id) treats infections caused by bacteria. It belongs to a group of medications called penicillin antibiotics. It will not treat colds, the flu, or infections caused by viruses. ?This medicine may be used for other purposes; ask your health care provider or pharmacist if you have questions. ?COMMON BRAND NAME(S): Augmentin ?What should I tell my care team before I take this medication? ?They need to know if you have any of these conditions: ?Kidney disease ?Liver disease ?Mononucleosis ?Stomach or intestine problems such as colitis ?An unusual or allergic reaction to amoxicillin, other penicillin or cephalosporin antibiotics, clavulanic acid, other medications, foods, dyes, or preservatives ?Pregnant or trying to get pregnant ?Breast-feeding ?How should I use this medication? ?Take this medication by mouth. Take it as directed on the prescription label at the same time every day. Take it with food at the start of a meal or snack. Take all of this medication unless your care team tells you to stop it early. Keep taking it even if you think you are better. ?Talk to your care team about the use of this medication in children. While it may be prescribed for selected conditions, precautions do apply. ?Overdosage: If you think you have taken too much of this medicine contact a poison control center or emergency room at once. ?NOTE: This medicine is only for you. Do not share this medicine with others. ?What if I miss a dose? ?If you miss a dose, take it as soon as you can. If it is almost time for your next dose, take only that dose. Do not take double or extra doses. ?What may interact with this medication? ?Allopurinol ?Anticoagulants ?Birth control pills ?Methotrexate ?Probenecid ?This list may not describe all possible interactions. Give your health care provider a list of all the medicines,  herbs, non-prescription drugs, or dietary supplements you use. Also tell them if you smoke, drink alcohol, or use illegal drugs. Some items may interact with your medicine. ?What should I watch for while using this medication? ?Tell your care team if your symptoms do not start to get better or if they get worse. ?This medication may cause serious skin reactions. They can happen weeks to months after starting the medication. Contact your care team right away if you notice fevers or flu-like symptoms with a rash. The rash may be red or purple and then turn into blisters or peeling of the skin. Or, you might notice a red rash with swelling of the face, lips or lymph nodes in your neck or under your arms. ?Do not treat diarrhea with over the counter products. Contact your care team if you have diarrhea that lasts more than 2 days or if it is severe and watery. ?If you have diabetes, you may get a false-positive result for sugar in your urine. Check with your care team. ?Birth control may not work properly while you are taking this medication. Talk to your care team about using an extra method of birth control. ?What side effects may I notice from receiving this medication? ?Side effects that you should report to your care team as soon as possible: ?Allergic reactions--skin rash, itching, hives, swelling of the face, lips, tongue, or throat ?Liver injury--right upper belly pain, loss of appetite, nausea, light-colored stool, dark yellow or brown urine, yellowing skin or eyes, unusual weakness or fatigue ?Redness, blistering, peeling, or loosening of the skin, including inside  the mouth ?Severe diarrhea, fever ?Unusual vaginal discharge, itching, or odor ?Side effects that usually do not require medical attention (report to your care team if they continue or are bothersome): ?Diarrhea ?Nausea ?Vomiting ?This list may not describe all possible side effects. Call your doctor for medical advice about side effects. You may  report side effects to FDA at 1-800-FDA-1088. ?Where should I keep my medication? ?Keep out of the reach of children and pets. ?Store at room temperature between 20 and 25 degrees C (68 and 77 degrees F). Throw away any unused medication after the expiration date. ?NOTE: This sheet is a summary. It may not cover all possible information. If you have questions about this medicine, talk to your doctor, pharmacist, or health care provider. ?? 2022 Elsevier/Gold Standard (2020-10-17 00:00:00) ?Otitis Media, Adult ?Otitis media occurs when there is inflammation and fluid in the middle ear with signs and symptoms of an acute infection. The middle ear is a part of the ear that contains bones for hearing as well as air that helps send sounds to the brain. When infected fluid builds up in this space, it causes pressure and can lead to an ear infection. The eustachian tube connects the middle ear to the back of the nose (nasopharynx) and normally allows air into the middle ear. If the eustachian tube becomes blocked, fluid can build up and become infected. ?What are the causes? ?This condition is caused by a blockage in the eustachian tube. This can be caused by mucus or by swelling of the tube. Problems that can cause a blockage include: ?A cold or other upper respiratory infection. ?Allergies. ?An irritant, such as tobacco smoke. ?Enlarged adenoids. The adenoids are areas of soft tissue located high in the back of the throat, behind the nose and the roof of the mouth. They are part of the body's defense system (immune system). ?A mass in the nasopharynx. ?Damage to the ear caused by pressure changes (barotrauma). ?What increases the risk? ?You are more likely to develop this condition if you: ?Smoke or are exposed to tobacco smoke. ?Have an opening in the roof of your mouth (cleft palate). ?Have gastroesophageal reflux. ?Have an immune system disorder. ?What are the signs or symptoms? ?Symptoms of this condition  include: ?Ear pain. ?Fever. ?Decreased hearing. ?Tiredness (lethargy). ?Fluid leaking from the ear, if the eardrum is ruptured or has burst. ?Ringing in the ear. ?How is this diagnosed? ?This condition is diagnosed with a physical exam. During the exam, your health care provider will use an instrument called an otoscope to look in your ear and check for redness, swelling, and fluid. He or she will also ask about your symptoms. ?Your health care provider may also order tests, such as: ?A pneumatic otoscopy. This is a test to check the movement of the eardrum. It is done by squeezing a small amount of air into the ear. ?A tympanogram. This is a test that shows how well the eardrum moves in response to air pressure in the ear canal. It provides a graph for your health care provider to review. ?How is this treated? ?This condition can go away on its own within 3-5 days. But if the condition is caused by a bacterial infection and does not go away on its own, or if it keeps coming back, your health care provider may: ?Prescribe antibiotic medicine to treat the infection. ?Prescribe or recommend medicines to control pain. ?Follow these instructions at home: ?Take over-the-counter and prescription medicines only as told  by your health care provider. ?If you were prescribed an antibiotic medicine, take it as told by your health care provider. Do not stop taking the antibiotic even if you start to feel better. ?Keep all follow-up visits. This is important. ?Contact a health care provider if: ?You have bleeding from your nose. ?There is a lump on your neck. ?You are not feeling better in 5 days. ?You feel worse instead of better. ?Get help right away if: ?You have severe pain that is not controlled with medicine. ?You have swelling, redness, or pain around your ear. ?You have stiffness in your neck. ?A part of your face is not moving (paralyzed). ?The bone behind your ear (mastoid bone) is tender when you touch it. ?You  develop a severe headache. ?Summary ?Otitis media is redness, soreness, and swelling of the middle ear, usually resulting in pain and decreased hearing. ?This condition can go away on its own within 3-5 days. ?If the pr

## 2022-01-17 NOTE — Progress Notes (Signed)
Unable to reach patient regarding returning to rehab. Sent letter to officially be dropped from program 01/31/22. ?

## 2022-01-18 ENCOUNTER — Telehealth: Payer: Self-pay

## 2022-01-18 DIAGNOSIS — I214 Non-ST elevation (NSTEMI) myocardial infarction: Secondary | ICD-10-CM

## 2022-01-18 NOTE — Progress Notes (Signed)
Discharge Progress Report ? ?Patient Details  ?Name: Sabrina Montoya ?MRN: [id number] ?Date of Birth: 1941/02/28 ?Referring Provider:   ?Flowsheet Row Cardiac Rehab from 10/04/2021 in [facility] Cardiac and Pulmonary Rehab  ?Referring Provider Harle Stanford MD  ? ?  ? ? ? ?Number of Visits: 24 ? ?Reason for Discharge:  ?Early Exit: Medical- tooth problems/pain ? ?Smoking History:  ?Social History  ? ?Tobacco Use  ?Smoking Status Former  ? Packs/day: 0.50  ? Years: 30.00  ? Pack years: 15.00  ? Types: Cigarettes  ? Quit date: 04/01/2012  ? Years since quitting: 9.8  ?Smokeless Tobacco Never  ?Tobacco Comments  ? quit for 2 years after sinus infection and 6 months after heart surgery  ? ? ?Diagnosis:  ?NSTEMI (non-ST elevated myocardial infarction) ([place]) ? ?ADL [facility]: ? ? ?Initial Exercise Prescription: ? Initial Exercise Prescription - 10/04/21 1500   ? ?  ? Date of Initial Exercise RX and Referring Provider  ? Date 10/04/21   ? Referring Provider Harle Stanford MD   ?  ? Recumbant Bike  ? Level 1   ? RPM 60   ? Minutes 15   ? METs 1.3   ?  ? NuStep  ? Level 1   ? SPM 80   ? Minutes 15   ? METs 1.3   ?  ? T5 Nustep  ? Level 1   ? SPM 80   ? Minutes 15   ? METs 1.3   ?  ? Track  ? Laps 13   ? Minutes 15   ? METs 1.71   ?  ? Prescription Details  ? Frequency (times per week) 2   ? Duration Progress to 30 minutes of continuous aerobic without signs/symptoms of physical distress   ?  ? Intensity  ? THRR 40-80% of Max Heartrate 90-124   ? Ratings of Perceived Exertion 11-13   ? Perceived Dyspnea 0-4   ?  ? Progression  ? Progression Continue progressive overload as per policy without signs/symptoms or physical distress.   ?  ? Resistance Training  ? Training Prescription Yes   ? Weight 3 lb   ? Reps 10-15   ? ?  ?  ? ?  ? ? ?Discharge Exercise Prescription (Final Exercise Prescription Changes): ? Exercise Prescription Changes - 12/26/21 1300   ? ?  ? Response to Exercise  ? Blood Pressure (Admit) 132/66   ? Blood Pressure (Exit)  122/70   ? Heart Rate (Admit) 67 bpm   ? Heart Rate (Exercise) 91 bpm   ? Heart Rate (Exit) 66 bpm   ? Rating of Perceived Exertion (Exercise) 15   ? Symptoms none   ? Duration Continue with 30 min of aerobic exercise without signs/symptoms of physical distress.   ? Intensity THRR unchanged   ?  ? Progression  ? Progression Continue to progress workloads to maintain intensity without signs/symptoms of physical distress.   ? Average METs 2.34   ?  ? Resistance Training  ? Training Prescription Yes   ? Weight 3 lb   ? Reps 10-15   ?  ? Interval Training  ? Interval Training No   ?  ? Recumbant Bike  ? Level 1   ? Minutes 15   ?  ? NuStep  ? Level 5   ? Minutes 15   ? METs 2.3   ?  ? T5 Nustep  ? Level 1   ? Minutes 15   ?  METs 2.1   ?  ? Track  ? Laps 30   ? Minutes 15   ? METs 2.63   ?  ? Home Exercise Plan  ? Plans to continue exercise at Home (comment)   walking, Youtube videos  ? Frequency Add 3 additional days to program exercise sessions.   start with 1  ? Initial Home Exercises Provided 10/25/21   ?  ? Oxygen  ? Maintain Oxygen Saturation 88% or higher   ? ?  ?  ? ?  ? ? ?Functional Capacity: ? 6 Minute Walk   ? ? [place] Name 10/04/21 1452  ?  ?  ?  ? 6 Minute Walk  ? Phase Initial    ? Distance 800 feet    ? Walk Time 5.5 minutes  Break 2:38-3:04    ? # of Rest Breaks 1    ? MPH 1.65    ? METS 1.32    ? RPE 12    ? Perceived Dyspnea  3    ? VO2 Peak 4.62    ? Symptoms Yes (comment)    ? Comments SOB    ? Resting HR 57 bpm    ? Resting BP 110/66    ? Resting Oxygen Saturation  97 %    ? Exercise Oxygen Saturation  during 6 min walk 100 %    ? Max Ex. HR 76 bpm    ? Max Ex. BP 132/66    ? 2 Minute Post BP 112/62    ? ?  ?  ? ?  ? ? ? ?Nutrition & Weight - Outcomes: ? Pre Biometrics - 10/04/21 1450   ? ?  ? Pre Biometrics  ? Height '5\' 4"'$  (1.626 m)   ? Weight 152 lb 9.6 oz (69.2 kg)   ? BMI (Calculated) 26.18   ? Single Leg Stand 12.4 seconds   ? ?  ?  ? ?  ? ? ? ?Nutrition: ? Nutrition Therapy & Goals - 12/13/21  1109   ? ?  ? Nutrition Therapy  ? RD appointment deferred Yes   Had an appointment scheduled for Sabrina Montoya in January - had to cancel due to provider. Sabrina Montoya reports that she would not like to reschedule anytime soon. Will defer and continue to follow up.  ?  ? Personal Nutrition Goals  ? Nutrition Goal Had an appointment scheduled for Sabrina Montoya in January - had to cancel due to provider. Sabrina Montoya reports that she would not like to reschedule anytime soon. Will defer and continue to follow up.   ? ?  ?  ? ?  ? ? ? ?Goals reviewed with patient; copy given to patient. ?

## 2022-01-18 NOTE — Progress Notes (Signed)
Cardiac Individual Treatment Plan ? ?Patient Details  ?Name: Sabrina Montoya ?MRN: [id number] ?Date of Birth: 01-Dec-1940 ?Referring Provider:   ?Flowsheet Row Cardiac Rehab from 10/04/2021 in [facility] Cardiac and Pulmonary Rehab  ?Referring Provider Harle Stanford MD  ? ?  ? ? ?Initial Encounter Date:  ?Flowsheet Row Cardiac Rehab from 10/04/2021 in [facility] Cardiac and Pulmonary Rehab  ?Date 10/04/21  ? ?  ? ? ?Visit Diagnosis: NSTEMI (non-ST elevated myocardial infarction) ([place]) ? ?Patient's Home Medications on Admission: ? ?Current Outpatient Medications:  ?  amLODipine (NORVASC) 5 MG tablet, Take 5 mg by mouth daily., Disp: , Rfl:  ?  amoxicillin-clavulanate (AUGMENTIN) 875-125 MG tablet, Take 1 tablet by mouth 2 (two) times daily., Disp: 20 tablet, Rfl: 0 ?  aspirin 81 MG tablet, Take 81 mg by mouth daily., Disp: , Rfl:  ?  cholecalciferol (VITAMIN D) 1000 UNITS tablet, Take 1,000 Units by mouth daily., Disp: , Rfl:  ?  isosorbide mononitrate (IMDUR) 60 MG 24 hr tablet, Take 1 tablet (60 mg total) by mouth daily., Disp: 30 tablet, Rfl: 0 ?  metoprolol succinate (TOPROL-XL) 50 MG 24 hr tablet, Take 1 tablet (50 mg total) by mouth daily. Take with or immediately following a meal., Disp: 90 tablet, Rfl: 1 ?  Multiple Vitamins-Minerals (PRESERVISION AREDS 2 PO), Take by mouth 2 (two) times daily., Disp: , Rfl:  ?  nitroGLYCERIN (NITROSTAT) 0.4 MG SL tablet, Place 1 tablet (0.4 mg total) under the tongue every 5 (five) minutes as needed for chest pain., Disp: 50 tablet, Rfl: 4 ?  omeprazole (PRILOSEC) 20 MG capsule, TAKE 1 CAPSULE(20 MG) BY MOUTH DAILY, Disp: 90 capsule, Rfl: 1 ?  rosuvastatin (CRESTOR) 40 MG tablet, Take 1 tablet (40 mg total) by mouth daily., Disp: 90 tablet, Rfl: 1 ?  Turmeric (QC TUMERIC COMPLEX PO), Take 1 mg by mouth daily., Disp: , Rfl:  ? ?Past Medical History: ?Past Medical History:  ?Diagnosis Date  ? Abdominal aortic aneurysm without mention of rupture   ? infrarenal, stable, folllowed by Leotis Pain  ?  Acute posthemorrhagic anemia   ? Arthritis   ? B12 deficiency   ? CAD (coronary artery disease), autologous vein bypass graft   ? Cardiac dysrhythmia, unspecified   ? Colon polyp   ? Depression   ? Diverticulitis of colon   ? Dizziness   ? DUE TO MEDICINES  ? Dysrhythmia   ? GERD (gastroesophageal reflux disease)   ? HOH (hard of hearing)   ? Hyperlipidemia   ? Hypertension   ? pt denies. placed on meds after CABG  ? IBS (irritable bowel syndrome)   ? Neuropathy   ? Neuropathy 07/01/2013  ? Peripheral vascular disease ([place])   ? s/p CEA   ? Reflux esophagitis   ? Rheumatic fever   ? possible at age 8  ? Sliding hiatal hernia   ? Tobacco abuse   ? Tobacco abuse   ? ? ?Tobacco Use: ?Social History  ? ?Tobacco Use  ?Smoking Status Former  ? Packs/day: 0.50  ? Years: 30.00  ? Pack years: 15.00  ? Types: Cigarettes  ? Quit date: 04/01/2012  ? Years since quitting: 9.8  ?Smokeless Tobacco Never  ?Tobacco Comments  ? quit for 2 years after sinus infection and 6 months after heart surgery  ? ? ?Labs: ?Recent Review Flowsheet Data   ? ? Labs for ITP Cardiac and Pulmonary Rehab Latest Ref Rng & Units 05/20/2019 05/13/2020 04/27/2021 06/06/2021 12/13/2021  ? Cholestrol  0 - 200 mg/dL 119 119 141 116 114  ? LDLCALC 0 - 99 mg/dL 57 44 63 48 40  ? LDLDIRECT mg/dL - - - - -  ? HDL >39.00 mg/dL 40.80 38.20(L) 44.60 44 36.20(L)  ? Trlycerides 0.0 - 149.0 mg/dL 106.0 186.0(H) 169.0(H) 122 191.0(H)  ? Hemoglobin A1c 4.6 - 6.5 % 6.7(H) 6.6(H) 7.3(H) 7.0(H) 7.0(H)  ? ?  ? ? ? ?Exercise Target Goals: ?Exercise Program Goal: ?Individual exercise prescription set using results from initial 6 min walk test and THRR while considering  patient?s activity barriers and safety.  ? ?Exercise Prescription Goal: ?Initial exercise prescription builds to 30-45 minutes a day of aerobic activity, 2-3 days per week.  Home exercise guidelines will be given to patient during program as part of exercise prescription that the participant will  acknowledge. ? ? ?Education: Aerobic Exercise: ?- Group verbal and visual presentation on the components of exercise prescription. Introduces F.I.T.T principle from ACSM for exercise prescriptions.  Reviews F.I.T.T. principles of aerobic exercise including progression. Written material given at graduation. ?Flowsheet Row Cardiac Rehab from 12/15/2021 in [facility] Cardiac and Pulmonary Rehab  ?Education need identified 10/04/21  ? ?  ? ? ?Education: Resistance Exercise: ?- Group verbal and visual presentation on the components of exercise prescription. Introduces F.I.T.T principle from ACSM for exercise prescriptions  Reviews F.I.T.T. principles of resistance exercise including progression. Written material given at graduation. ?Flowsheet Row Cardiac Rehab from 12/15/2021 in [facility] Cardiac and Pulmonary Rehab  ?Date 11/10/21  ?Educator [facility]  ?Instruction Review Code 1- Verbalizes Understanding  ? ?  ? ?  ?Education: Exercise & Equipment Safety: ?- Individual verbal instruction and demonstration of equipment use and safety with use of the equipment. ?Flowsheet Row Cardiac Rehab from 12/15/2021 in [facility] Cardiac and Pulmonary Rehab  ?Date 08/18/21  ?Educator [facility]  ?Instruction Review Code 1- Verbalizes Understanding  ? ?  ? ? ?Education: Exercise Physiology & General Exercise Guidelines: ?- Group verbal and written instruction with models to review the exercise physiology of the cardiovascular system and associated critical values. Provides general exercise guidelines with specific guidelines to those with heart or lung disease.  ?Flowsheet Row Cardiac Rehab from 12/15/2021 in [facility] Cardiac and Pulmonary Rehab  ?Date 10/27/21  ?Educator [facility]  ?Instruction Review Code 1- Verbalizes Understanding  ? ?  ? ? ?Education: Flexibility, Balance, Mind/Body Relaxation: ?- Group verbal and visual presentation with interactive activity on the components of exercise prescription. Introduces F.I.T.T principle from ACSM for exercise prescriptions. Reviews  F.I.T.T. principles of flexibility and balance exercise training including progression. Also discusses the mind body connection.  Reviews various relaxation techniques to help reduce and manage stress (i.e. Deep breathing, progressive muscle relaxation, and visualization). Balance handout provided to take home. Written material given at graduation. ?Flowsheet Row Cardiac Rehab from 12/15/2021 in [facility] Cardiac and Pulmonary Rehab  ?Date 11/17/21  ?Educator [facility]  ?Instruction Review Code 1- Verbalizes Understanding  ? ?  ? ? ?Activity Barriers & Risk Stratification: ? Activity Barriers & Cardiac Risk Stratification - 10/04/21 1448   ? ?  ? Activity Barriers & Cardiac Risk Stratification  ? Activity Barriers Back Problems;Neck/Spine Problems;Arthritis;Deconditioning;Muscular Weakness;Other (comment)   ? Comments History of rotator cuff on right   ? Cardiac Risk Stratification High   ? ?  ?  ? ?  ? ? ?6 Minute Walk: ? 6 Minute Walk   ? ? [place] Name 10/04/21 1452  ?  ?  ?  ? 6 Minute Walk  ?  Phase Initial    ? Distance 800 feet    ? Walk Time 5.5 minutes  Break 2:38-3:04    ? # of Rest Breaks 1    ? MPH 1.65    ? METS 1.32    ? RPE 12    ? Perceived Dyspnea  3    ? VO2 Peak 4.62    ? Symptoms Yes (comment)    ? Comments SOB    ? Resting HR 57 bpm    ? Resting BP 110/66    ? Resting Oxygen Saturation  97 %    ? Exercise Oxygen Saturation  during 6 min walk 100 %    ? Max Ex. HR 76 bpm    ? Max Ex. BP 132/66    ? 2 Minute Post BP 112/62    ? ?  ?  ? ?  ? ? ?Oxygen Initial Assessment: ? ? ?Oxygen Re-Evaluation: ? ? ?Oxygen Discharge (Final Oxygen Re-Evaluation): ? ? ?Initial Exercise Prescription: ? Initial Exercise Prescription - 10/04/21 1500   ? ?  ? Date of Initial Exercise RX and Referring Provider  ? Date 10/04/21   ? Referring Provider Harle Stanford MD   ?  ? Recumbant Bike  ? Level 1   ? RPM 60   ? Minutes 15   ? METs 1.3   ?  ? NuStep  ? Level 1   ? SPM 80   ? Minutes 15   ? METs 1.3   ?  ? T5 Nustep  ? Level 1   ? SPM  80   ? Minutes 15   ? METs 1.3   ?  ? Track  ? Laps 13   ? Minutes 15   ? METs 1.71   ?  ? Prescription Details  ? Frequency (times per week) 2   ? Duration Progress to 30 minutes of continuous aerobic without signs/symptoms of

## 2022-01-18 NOTE — Progress Notes (Signed)
CBC slightly improved chronically stable anemia. Keep regular follow up with PCP.

## 2022-01-18 NOTE — Telephone Encounter (Signed)
Patient called to let us know she was still having tooth problems and in pain- her doctors are currently working up a treatment plan. She is not sure when they will do work-up and we decided to discharge at this time due to the time. Informed patient to contact her PCP about a possible Pulmonary referral when she feels like she is ready to return. Patient in agreement.  ?

## 2022-01-20 ENCOUNTER — Ambulatory Visit: Payer: Medicare Other | Admitting: Internal Medicine

## 2022-02-03 DIAGNOSIS — N1831 Chronic kidney disease, stage 3a: Secondary | ICD-10-CM

## 2022-02-03 DIAGNOSIS — G459 Transient cerebral ischemic attack, unspecified: Secondary | ICD-10-CM

## 2022-02-09 DIAGNOSIS — N281 Cyst of kidney, acquired: Secondary | ICD-10-CM | POA: Diagnosis not present

## 2022-02-09 DIAGNOSIS — R7989 Other specified abnormal findings of blood chemistry: Secondary | ICD-10-CM | POA: Diagnosis not present

## 2022-02-09 DIAGNOSIS — I700 Atherosclerosis of aorta: Secondary | ICD-10-CM | POA: Diagnosis not present

## 2022-02-14 DIAGNOSIS — Z961 Presence of intraocular lens: Secondary | ICD-10-CM | POA: Diagnosis not present

## 2022-02-14 DIAGNOSIS — H353132 Nonexudative age-related macular degeneration, bilateral, intermediate dry stage: Secondary | ICD-10-CM | POA: Diagnosis not present

## 2022-02-14 DIAGNOSIS — H40053 Ocular hypertension, bilateral: Secondary | ICD-10-CM | POA: Diagnosis not present

## 2022-02-20 DIAGNOSIS — R7989 Other specified abnormal findings of blood chemistry: Secondary | ICD-10-CM | POA: Diagnosis not present

## 2022-02-20 DIAGNOSIS — N281 Cyst of kidney, acquired: Secondary | ICD-10-CM | POA: Diagnosis not present

## 2022-02-20 DIAGNOSIS — I700 Atherosclerosis of aorta: Secondary | ICD-10-CM | POA: Diagnosis not present

## 2022-03-07 ENCOUNTER — Telehealth: Payer: Medicare Other

## 2022-03-23 ENCOUNTER — Other Ambulatory Visit: Payer: Self-pay | Admitting: Internal Medicine

## 2022-03-23 ENCOUNTER — Telehealth: Payer: Self-pay

## 2022-03-23 MED ORDER — OMEPRAZOLE 20 MG PO CPDR: DELAYED_RELEASE_CAPSULE | ORAL | 1 refills | Status: DC

## 2022-03-23 NOTE — Telephone Encounter (Signed)
Patient called to state she has enough omeprazole to finish the weekend, but she has no refills left.  Patient states she would like to request a refill.  Patient states she ended up with three 90-day supplies of isosorbide mononitrate, so she is good on that one.  *Patient's preferred pharmacy is [facility] in [place].

## 2022-03-23 NOTE — Telephone Encounter (Signed)
Medication has been refilled.

## 2022-05-30 ENCOUNTER — Ambulatory Visit (INDEPENDENT_AMBULATORY_CARE_PROVIDER_SITE_OTHER): Payer: Medicare Other

## 2022-05-30 VITALS — BP 135/81 | HR 66 | Resp 16 | Ht 63.0 in | Wt 151.8 lb

## 2022-05-30 DIAGNOSIS — Z1231 Encounter for screening mammogram for malignant neoplasm of breast: Secondary | ICD-10-CM

## 2022-05-30 DIAGNOSIS — Z0000 Encounter for general adult medical examination without abnormal findings: Secondary | ICD-10-CM | POA: Diagnosis not present

## 2022-05-30 NOTE — Patient Instructions (Addendum)
Sabrina Montoya , Thank you for taking time to come for your Medicare Wellness Visit. I appreciate your ongoing commitment to your health goals. Please review the following plan we discussed and let me know if I can assist you in the future.   These are the goals we discussed:  Goals Addressed             This Visit's Progress    I would like to lose about 10-15lb       Targeted core exercises          This is a list of the screening recommended for you and due dates:  Health Maintenance  Topic Date Due   Mammogram  03/18/2022   Urine Protein Check  04/27/2022   COVID-19 Vaccine (4 - Pfizer series) 06/15/2022*   Zoster (Shingles) Vaccine (1 of 2) 08/30/2022*   Tetanus Vaccine  05/31/2023*   Flu Shot  06/06/2022   Hemoglobin A1C  06/12/2022   Complete foot exam   08/18/2022   Eye exam for diabetics  02/05/2023   Pneumonia Vaccine  Completed   DEXA scan (bone density measurement)  Completed   HPV Vaccine  Aged Out  *Topic was postponed. The date shown is not the original due date.    Call and schedule mammogram [phone]  Mammogram A mammogram is an X-ray of the breasts. This procedure can screen for and detect any changes that may indicate breast cancer. Mammograms are regularly done beginning at age 7 for women with average risk. A man may have a mammogram if he has a lump or swelling in his breast tissue. A mammogram can also identify other changes and variations in the breast, such as: Inflammation of the breast tissue (mastitis). An infected area that contains a collection of pus (abscess). A fluid-filled sac (cyst). Tumors that are not cancerous (benign). Fibrocystic changes. This is when breast tissue becomes denser and can make the tissue feel rope-like or uneven under the skin. Women at higher risk for breast cancer need earlier and more comprehensive screening for abnormal changes. Breast tomosynthesis, or three-dimensional (3D) mammography, and digital breast  tomosynthesis are advanced forms of imaging that create 3D pictures of the breasts. Tell a health care provider: About any allergies you have. If you have breast implants. If you have had previous breast disease, biopsy, or surgery. If you have a family history of breast cancer. If you are breastfeeding. Whether you are pregnant or may be pregnant. What are the risks? Generally, this is a safe procedure. However, problems may occur, including: Exposure to radiation. Radiation levels are very low with this test. The need for more tests. The mammogram fails to detect certain cancers or the results are misinterpreted. Difficulty with detecting breast cancer in women with dense breasts. What happens before the procedure? Schedule your test about 1-2 weeks after your menstrual period if you are menstruating. This is usually when your breasts are the least tender. If you have had a mammogram done at a different facility in the past, get the mammogram X-rays or have them sent to your current exam facility. The new and old images will be compared. Wash your breasts and underarms on the day of the test. Do not wear deodorants, perfumes, lotions, or powders anywhere on your body on the day of the test. Remove any jewelry from your neck. Wear clothes that you can change into and out of easily. What happens during the procedure?  You will undress from the waist up  and put on a gown that opens in the front. You will stand in front of the X-ray machine. Each breast will be placed between two plastic or glass plates. The plates will compress your breast for a few seconds. Try to stay as relaxed as possible. This procedure does not cause any harm to your breasts. Any discomfort you feel will be very brief. X-rays will be taken from different angles of each breast. The procedure may vary among health care providers and hospitals. What can I expect after the procedure? The mammogram will be examined by a  specialist (radiologist). You may need to repeat certain parts of the test, depending on the quality of the images. This is done if the radiologist needs a better view of the breast tissue. You may resume your normal activities. It is up to you to get the results of your procedure. Ask your health care provider, or the department that is doing the procedure, when your results will be ready. Summary A mammogram is an X-ray of the breasts. This procedure can screen for and detect any changes that may indicate breast cancer. A man may have a mammogram if he has a lump or swelling in his breast tissue. If you have had a mammogram done at a different facility in the past, get the mammogram X-rays or have them sent to your current exam facility in order to compare them. Schedule your test about 1-2 weeks after your menstrual period if you are menstruating. Ask when your test results will be ready. Make sure you get your test results. This information is not intended to replace advice given to you by your health care provider. Make sure you discuss any questions you have with your health care provider. Document Revised: 07/06/2021 Document Reviewed: 08/23/2020 Elsevier Patient Education  [place].

## 2022-05-30 NOTE — Progress Notes (Addendum)
Subjective:   Sabrina Montoya is a 81 y.o. female who presents for Medicare Annual (Subsequent) preventive examination.  Review of Systems    No ROS.  Medicare Wellness  Cardiac Risk Factors include: advanced age (>31mn, >>65women)     Objective:    Today's Vitals   05/30/22 1237  BP: 135/81  Pulse: 66  Resp: 16  SpO2: 98%  Weight: 151 lb 12.8 oz (68.9 kg)  Height: '5\' 3"'$  (1.6 m)   Body mass index is 26.89 kg/m.     05/30/2022   12:58 PM 08/18/2021   10:40 AM 07/22/2021   12:04 PM 06/07/2021   10:30 AM 06/06/2021   10:00 AM 06/06/2021   12:14 AM 04/27/2021   11:38 AM  Advanced Directives  Does Patient Have a Medical Advance Directive? No No Yes No No No No  Type of AComptrollerLiving will      Does patient want to make changes to medical advance directive?   No - Patient declined      Copy of HSierra Blancain Chart?   No - copy requested      Would patient like information on creating a medical advance directive? No - Patient declined No - Patient declined  No - Patient declined No - Patient declined  No - Patient declined    Current Medications (verified) Outpatient Encounter Medications as of 05/30/2022  Medication Sig   amLODipine (NORVASC) 5 MG tablet Take 5 mg by mouth daily.   aspirin 81 MG tablet Take 81 mg by mouth daily.   cholecalciferol (VITAMIN D) 1000 UNITS tablet Take 1,000 Units by mouth daily.   isosorbide mononitrate (IMDUR) 60 MG 24 hr tablet Take 1 tablet (60 mg total) by mouth daily.   metoprolol succinate (TOPROL-XL) 50 MG 24 hr tablet Take 1 tablet (50 mg total) by mouth daily. Take with or immediately following a meal.   Multiple Vitamins-Minerals (PRESERVISION AREDS 2 PO) Take by mouth 2 (two) times daily.   nitroGLYCERIN (NITROSTAT) 0.4 MG SL tablet Place 1 tablet (0.4 mg total) under the tongue every 5 (five) minutes as needed for chest pain.   omeprazole (PRILOSEC) 20 MG capsule TAKE 1 CAPSULE(20 MG)  BY MOUTH DAILY   rosuvastatin (CRESTOR) 40 MG tablet Take 1 tablet (40 mg total) by mouth daily.   Turmeric (QC TUMERIC COMPLEX PO) Take 1 mg by mouth daily.   [DISCONTINUED] amoxicillin-clavulanate (AUGMENTIN) 875-125 MG tablet Take 1 tablet by mouth 2 (two) times daily.   No facility-administered encounter medications on file as of 05/30/2022.    Allergies (verified) Telmisartan, Niacin and related, Sertraline hcl, Sulfa drugs cross reactors, Tetracyclines & related, Tizanidine, Latex, and Simvastatin   History: Past Medical History:  Diagnosis Date   Abdominal aortic aneurysm without mention of rupture    infrarenal, stable, folllowed by JLeotis Pain  Acute posthemorrhagic anemia    Arthritis    B12 deficiency    CAD (coronary artery disease), autologous vein bypass graft    Cardiac dysrhythmia, unspecified    Colon polyp    Depression    Diverticulitis of colon    Dizziness    DUE TO MEDICINES   Dysrhythmia    GERD (gastroesophageal reflux disease)    HOH (hard of hearing)    Hyperlipidemia    Hypertension    pt denies. placed on meds after CABG   IBS (irritable bowel syndrome)    Neuropathy  Neuropathy 07/01/2013   Peripheral vascular disease (HCC)    s/p CEA    Reflux esophagitis    Rheumatic fever    possible at age 47   Sliding hiatal hernia    Tobacco abuse    Tobacco abuse    Past Surgical History:  Procedure Laterality Date   ABDOMINAL HYSTERECTOMY     ABDOMINAL SURGERY  1976   for pain secondary to scar tissue, s/p apply   APPENDECTOMY  1974   BACK SURGERY     CAROTID ENDARTERECTOMY     CATARACT EXTRACTION W/PHACO Right 10/18/2016   Procedure: CATARACT EXTRACTION PHACO AND INTRAOCULAR LENS PLACEMENT ([place]);  Surgeon: Estill Cotta, MD;  Location: [facility];  Service: Ophthalmology;  Laterality: Right;  Lot # [id number] H [place]: 01:04.0 AP%: 24.4 CDE: 30.44   CATARACT EXTRACTION W/PHACO Left 03/14/2017   Procedure: CATARACT EXTRACTION PHACO AND INTRAOCULAR  LENS PLACEMENT ([place])  Left;  Surgeon: Leandrew Koyanagi, MD;  Location: [place];  Service: Ophthalmology;  Laterality: Left;   CEA     Carotid stenosis found during workup for dysphagia,  [place]  2009   Dr. Hoover Brunette for cervical cord stenosis, C5-6 diskectomy   CHOLECYSTECTOMY  2002   Dr. Pat Patrick   CLOSED MANIPULATION SHOULDER     left shoulder post physical therapy, Right shoulder redo (toggle bolts)   COLONOSCOPY  09/01/14   coronary angiography  01/2011   one occluded artery with collateralization, other arteries patent   CORONARY ARTERY BYPASS GRAFT  2006   s/p triple bypass surgery, [facility]   heart catherization  2016   HERNIA REPAIR  may 2011   Dr. Pat Patrick   hernia repair     LEFT HEART CATH AND CORONARY ANGIOGRAPHY N/A 06/07/2021   Procedure: LEFT HEART CATH AND CORONARY ANGIOGRAPHY;  Surgeon: Isaias Cowman, MD;  Location: [place] [facility];  Service: Cardiovascular;  Laterality: N/A;   LUMBAR DISC SURGERY  1971   L5, unssuccessful, fusion in 1983 successful (2 lumbar)   PARTIAL HYSTERECTOMY     ROTATOR CUFF REPAIR  2002   right shoulder Dr. Nadean Corwin, [facility] in [place]     Family History  Problem Relation Age of Onset   Diabetes Mother    Heart disease Mother    Other Father        suicide   Cancer Sister        cervical cancer   Diabetes Sister    Diabetes Sister    Cancer Sister        breast   Breast cancer Sister    Heart disease Brother        coronary artery disease   Other Brother        suicide   Other Other        colon resection due to inflammation -nephew   Bladder Cancer Neg Hx    Kidney cancer Neg Hx    Social History   Socioeconomic History   Marital status: Widowed    Spouse name: Not on file   Number of children: 2   Years of education: 33   Highest education level: 12th grade  Occupational History   Not on file  Tobacco Use   Smoking status: Former     Packs/day: 0.50    Years: 30.00    Total pack years: 15.00    Types: Cigarettes    Quit date: 04/01/2012    Years since  quitting: 10.1   Smokeless tobacco: Never   Tobacco comments:    quit for 2 years after sinus infection and 6 months after heart surgery  Vaping Use   Vaping Use: Never used  Substance and Sexual Activity   Alcohol use: No   Drug use: No   Sexual activity: Not Currently  Other Topics Concern   Not on file  Social History Narrative   Widowed   Social Determinants of Health   Financial Resource Strain: Low Risk  (12/28/2021)   Overall Financial Resource Strain (CARDIA)    Difficulty of Paying Living Expenses: Not hard at all  Food Insecurity: No Food Insecurity (07/22/2021)   Hunger Vital Sign    Worried About Running Out of Food in the Last Year: Never true    Ran Out of Food in the Last Year: Never true  Transportation Needs: No Transportation Needs (07/22/2021)   PRAPARE - Hydrologist (Medical): No    Lack of Transportation (Non-Medical): No  Physical Activity: Inactive (07/22/2021)   Exercise Vital Sign    Days of Exercise per Week: 0 days    Minutes of Exercise per Session: 0 min  Stress: Stress Concern Present (07/22/2021)   [place]    Feeling of Stress : Very much  Social Connections: Moderately Isolated (07/22/2021)   Social Connection and Isolation Panel [NHANES]    Frequency of Communication with Friends and Family: More than three times a week    Frequency of Social Gatherings with Friends and Family: More than three times a week    Attends Religious Services: 1 to 4 times per year    Active Member of [employer] or Organizations: No    Attends Archivist Meetings: Never    Marital Status: Widowed    Tobacco Counseling Counseling given: Not Answered Tobacco comments: quit for 2 years after sinus infection and 6 months after heart  surgery   Clinical Intake:  Pre-visit preparation completed: Yes       Diabetes: Yes (Followed by PCP)  How often do you need to have someone help you when you read instructions, pamphlets, or other written materials from your doctor or pharmacy?: 1 - Never   Interpreter Needed?: No    Activities of Daily Living    05/30/2022    1:03 PM 07/22/2021   12:02 PM  In your present state of health, do you have any difficulty performing the following activities:  Hearing? 0 0  Vision? 0 0  Difficulty concentrating or making decisions? 0 0  Walking or climbing stairs? 0 1  Comment  Pain, Unsteady Gait and Balance  Dressing or bathing? 0 0  Doing errands, shopping? 0 0  Preparing Food and eating ? N N  Using the Toilet? N N  In the past six months, have you accidently leaked urine? N N  Do you have problems with loss of bowel control? N N  Managing your Medications? N N  Managing your Finances? N N  Housekeeping or managing your Housekeeping? N N    Patient Care Team: Crecencio Mc, MD as PCP - General (Internal Medicine) Crecencio Mc, MD (Internal Medicine) Bary Castilla Forest Gleason, MD (General Surgery)  Indicate any recent Medical Services you may have received from other than [facility] providers in the past year (date may be approximate).     Assessment:   This is a routine wellness examination for Dymphna.  Hearing/Vision  screen Hearing Screening - Comments:: Patient has difficulty hearing conversational tones in a crowd. Does not wear hearing aids.  Vision Screening - Comments:: Followed by [facility]  Wears corrective lenses  Cataract extraction, bilateral  Macular degeneration, dry No retinopathy reported.  They have seen their ophthalmologist in the last 12 months  Dietary issues and exercise activities discussed: Current Exercise Habits: Home exercise routine, Intensity: Mild Regular diet Good water intake   Goals Addressed             This Visit's  Progress    I would like to lose about 10-15lb       Targeted core exercises        Depression Screen    05/30/2022   12:55 PM 01/17/2022    1:39 PM 12/19/2021   11:16 AM 12/05/2021   10:31 AM 11/08/2021   11:32 AM 10/04/2021    2:48 PM 09/07/2021    2:00 PM  PHQ 2/9 Scores  PHQ - 2 Score 0 0 0 0 0 1 0  PHQ- 9 Score     1 5     Fall Risk    05/30/2022   12:55 PM 01/17/2022    1:39 PM 12/19/2021   11:16 AM 12/05/2021   10:31 AM 09/07/2021    2:00 PM  [place] in the past year? 0 0 0 0 0  Number falls in past yr:    0   Injury with Fall?    0   Risk for fall due to :  No Fall Risks No Fall Risks Medication side effect No Fall Risks  Follow up Falls evaluation completed Falls evaluation completed Falls evaluation completed Falls evaluation completed Falls evaluation completed    [place]: Home free of loose throw rugs in walkways, pet beds, electrical cords, etc? Yes  Adequate lighting in your home to reduce risk of falls? Yes   ASSISTIVE DEVICES UTILIZED TO PREVENT FALLS: Life alert? Yes  Use of a cane, walker or w/c? No  Grab bars in the bathroom? Yes  Shower chair or bench in shower? No  Comfort chair height toilet? Yes   TIMED UP AND GO: Was the test performed? Yes .  Length of time to ambulate 10 feet:15 sec.   Gait steady and fast without use of assistive device  Cognitive Function: Patient is alert and oriented x3.     02/02/2016    2:12 PM  MMSE - Mini Mental State Exam  Orientation to time 5  Orientation to Place 5  Registration 3  Attention/ Calculation 5  Recall 3  Language- name 2 objects 2  Language- repeat 1  Language- follow 3 step command 3  Language- read & follow direction 1  Write a sentence 1  Copy design 1  Total score 30        04/26/2020   11:30 AM 04/24/2019   11:06 AM 02/12/2018    1:59 PM 02/08/2017   12:20 PM  6CIT Screen  What Year? 0 points 0 points 0 points 0 points  What month?  0  points 0 points 0 points  What time?  0 points 0 points 0 points  Count back from 20  0 points 0 points 0 points  Months in reverse 0 points 0 points 0 points 0 points  Repeat phrase 0 points  0 points 0 points  Total Score   0 points 0 points  Immunizations Immunization History  Administered Date(s) Administered   Fluad Quad(high Dose 65+) 08/01/2019, 08/18/2021   Influenza Split 10/10/2011, 08/26/2012   Influenza, High Dose Seasonal PF 08/16/2016, 11/09/2017, 09/06/2018   Influenza,inj,Quad PF,6+ Mos 07/25/2013, 07/28/2014, 08/13/2015   Influenza-Unspecified 08/06/2012   PFIZER(Purple Top)SARS-COV-2 Vaccination 11/12/2019, 12/03/2019, 08/06/2020   Pneumococcal Conjugate-13 08/26/2014   Pneumococcal Polysaccharide-23 09/02/2010   TDAP status: Due, Education has been provided regarding the importance of this vaccine. Advised may receive this vaccine at local pharmacy or Health Dept. Aware to provide a copy of the vaccination record if obtained from local pharmacy or Health Dept. Verbalized acceptance and understanding.  Shingrix Completed?: No.    Education has been provided regarding the importance of this vaccine. Patient has been advised to call insurance company to determine out of pocket expense if they have not yet received this vaccine. Advised may also receive vaccine at local pharmacy or Health Dept. Verbalized acceptance and understanding.  Screening Tests Health Maintenance  Topic Date Due   MAMMOGRAM  03/18/2022   URINE MICROALBUMIN  04/27/2022   COVID-19 Vaccine (4 - Pfizer series) 06/15/2022 (Originally 10/01/2020)   Zoster Vaccines- Shingrix (1 of 2) 08/30/2022 (Originally 05/27/1992)   TETANUS/TDAP  05/31/2023 (Originally [birth year]/27/1961)   INFLUENZA VACCINE  06/06/2022   HEMOGLOBIN A1C  06/12/2022   FOOT EXAM  08/18/2022   OPHTHALMOLOGY EXAM  02/05/2023   Pneumonia Vaccine 5+ Years old  Completed   DEXA SCAN  Completed   HPV VACCINES  Aged Out   Health  Maintenance Health Maintenance Due  Topic Date Due   MAMMOGRAM  03/18/2022   URINE MICROALBUMIN  04/27/2022   Mammogram- ordered per patient.   Lung Cancer Screening: (Low Dose CT Chest recommended if Age 65-80 years, 30 pack-year currently smoking OR have quit w/in 15years.) does not qualify.   Hepatitis C Screening: does not qualify.  Vision Screening: Recommended annual ophthalmology exams for early detection of glaucoma and other disorders of the eye.  Dental Screening: Recommended annual dental exams for proper oral hygiene  Community Resource Referral / Chronic Care Management: CRR required this visit?  No   CCM required this visit?  No      Plan:   Keep all routine maintenance appointments.   I have personally reviewed and noted the following in the patient's chart:   Medical and social history Use of alcohol, tobacco or illicit drugs  Current medications and supplements including opioid prescriptions.  Functional ability and status Nutritional status Physical activity Advanced directives List of other physicians Hospitalizations, surgeries, and ER visits in previous 12 months Vitals Screenings to include cognitive, depression, and falls Referrals and appointments  In addition, I have reviewed and discussed with patient certain preventive protocols, quality metrics, and best practice recommendations. A written personalized care plan for preventive services as well as general preventive health recommendations were provided to patient.     OBrien-Blaney, Tabithia Stroder L, LPN   0/25/8527      I have reviewed the above information and agree with above.   Deborra Medina, MD

## 2022-06-12 ENCOUNTER — Ambulatory Visit: Payer: Self-pay | Admitting: *Deleted

## 2022-06-12 NOTE — Patient Outreach (Signed)
  Care Coordination   06/12/2022 Name: Sabrina Montoya MRN: [id number] DOB: June 17, 1941   Care Coordination Outreach Attempts:  An unsuccessful telephone outreach was attempted today to offer the patient information about available care coordination services as a benefit of their health plan.   Follow Up Plan:  Additional outreach attempts will be made to offer the patient care coordination information and services.   Encounter Outcome:  No Answer  Care Coordination Interventions Activated:  No   Care Coordination Interventions:  No, not indicated    Hubert Azure RN, MSN RN Care Management Coordinator  [place] [phone] [email or web address]

## 2022-07-07 ENCOUNTER — Encounter: Payer: Self-pay | Admitting: Cardiovascular Disease

## 2022-07-07 ENCOUNTER — Ambulatory Visit: Payer: Medicare Other | Attending: Cardiovascular Disease | Admitting: Cardiovascular Disease

## 2022-07-07 VITALS — BP 124/60 | HR 68 | Ht 63.0 in | Wt 149.1 lb

## 2022-07-07 DIAGNOSIS — I209 Angina pectoris, unspecified: Secondary | ICD-10-CM | POA: Insufficient documentation

## 2022-07-07 DIAGNOSIS — I7143 Infrarenal abdominal aortic aneurysm, without rupture: Secondary | ICD-10-CM | POA: Diagnosis not present

## 2022-07-07 DIAGNOSIS — I25718 Atherosclerosis of autologous vein coronary artery bypass graft(s) with other forms of angina pectoris: Secondary | ICD-10-CM | POA: Insufficient documentation

## 2022-07-07 DIAGNOSIS — E782 Mixed hyperlipidemia: Secondary | ICD-10-CM | POA: Insufficient documentation

## 2022-07-07 DIAGNOSIS — J432 Centrilobular emphysema: Secondary | ICD-10-CM | POA: Insufficient documentation

## 2022-07-07 DIAGNOSIS — I6523 Occlusion and stenosis of bilateral carotid arteries: Secondary | ICD-10-CM | POA: Insufficient documentation

## 2022-07-07 MED ORDER — RANOLAZINE ER 1000 MG PO TB12: 1000.0000 mg | ORAL_TABLET | Freq: Two times a day (BID) | ORAL | 6 refills | Status: DC

## 2022-07-07 NOTE — Patient Instructions (Addendum)
Medication Instructions:  - Your physician has recommended you make the following change in your medication:   1) START Ranexa 1000 mg: - take 0.5 tablet (500 mg) twice a day for one week,  then - take 1 tablet (1000 mg) twice daily  If the pharmacy runs this through insurance and it is too expsensive, then check [email or web address] to check on a cash pay price at the various pharmacies  2) After 2 weeks, If you continue to have angina, Add additional imdur/isosorbide 30 mg in the morning Please call the office at [phone]/ send [place] a MyChart message if you are increasing the imdur (isosorbide)  If you need a refill on your cardiac medications before your next appointment, please call your pharmacy.    Lab work: No new labs needed   Testing/Procedures: No new testing needed   Follow-Up: At [facility], you and your health needs are our priority.  As part of our continuing mission to provide you with exceptional heart care, we have created designated Provider Care Teams.  These Care Teams include your primary Cardiologist (physician) and Advanced Practice Providers (APPs -  Physician Assistants and Nurse Practitioners) who all work together to provide you with the care you need, when you need it.  You will need a follow up appointment in 6 months  Providers on your designated Care Team:   Murray Hodgkins, NP Christell Faith, PA-C Cadence Kathlen Mody, [place]  COVID-19 Vaccine Information can be found at: [email or web address] For questions related to vaccine distribution or appointments, please email [email or web address] or call [phone].

## 2022-07-07 NOTE — Progress Notes (Signed)
Cardiology Office Note  Date:  07/07/2022   ID:  Sabrina Montoya, DOB 1941/08/25, MRN [id number]  PCP:  Crecencio Mc, MD   Chief Complaint  Patient presents with   6 month follow up     Patient c/o chest pain that is relieved with NTG. Medications reviewed by the patient verbally.     HPI:  Sabrina Montoya is a 81 year old woman with past medical history of Coronary disease CABG,  LIMA to LAD, SVG to L PDA and OM1 2006 Patent LIMA to LAD, SVG to L PDA, occluded SVG to OM1 06/07/2021 COPD / quit tobacco abuse 2013 PAD, Status post carotid endarterectomy Chronic kidney disease Who presents for follow-up of her coronary disease and continued chest pain  Last seen in clinic March 2023  Doing well, continues to take occasional nitro for chest pain when she is rushing around [place] at home, lots of things going on with her house No regular exercise program Pain left jaw after biting something too hard  Compliant with her medications  cholesterol well controlled  Carotid ultrasound less than 39% disease bilaterally  EKG personally reviewed by myself on todays visit Normal sinus rhythm rate 68 bpm no significant ST-T wave changes   Other past medical history reviewed  Previously followed by Jefm Bryant, last seen December 2022  Prior cardiac history reviewed with her in detail hospitalized for non-ST elevation myocardial infarction August 2022  cardiac catheterization 06/07/2021 which revealed severe three-vessel coronary artery disease with occluded ostial LAD, occluded left circumflex, occluded nondominant RCA, with patent LIMA to distal LAD, patent SVG to left PDA with retrograde flow to proximal left circumflex and OM1, and occluded SVG to OM1. Patient treated medically.   LVEF greater than 55%.   07/01/2021 when she developed elevated blood pressures at home, 191/82, associated with heart pounding   7-day Holter monitor (07/28/2021 through 08/04/2021) revealed predominant sinus  bradycardia with mean heart rate of 57 bpm, heart rate range 46 to 127 bpm, infrequent premature atrial contractions and premature ventricular contractions, and rare atrial runs the longest lasting 23 beats.  LDL cholesterol was 48 on 06/06/2021. A1c trending higher, greater than 7.  Try to treat this with diet alone  PMH:   has a past medical history of Abdominal aortic aneurysm without mention of rupture, Acute posthemorrhagic anemia, Arthritis, B12 deficiency, CAD (coronary artery disease), autologous vein bypass graft, Cardiac dysrhythmia, unspecified, Colon polyp, Depression, Diverticulitis of colon, Dizziness, Dysrhythmia, GERD (gastroesophageal reflux disease), HOH (hard of hearing), Hyperlipidemia, Hypertension, IBS (irritable bowel syndrome), Neuropathy, Neuropathy (07/01/2013), Peripheral vascular disease ([place]), Reflux esophagitis, Rheumatic fever, Sliding hiatal hernia, Tobacco abuse, and Tobacco abuse.  PSH:    Past Surgical History:  Procedure Laterality Date   ABDOMINAL HYSTERECTOMY     ABDOMINAL SURGERY  1976   for pain secondary to scar tissue, s/p apply   APPENDECTOMY  1974   BACK SURGERY     CAROTID ENDARTERECTOMY     CATARACT EXTRACTION W/PHACO Right 10/18/2016   Procedure: CATARACT EXTRACTION PHACO AND INTRAOCULAR LENS PLACEMENT ([place]);  Surgeon: Estill Cotta, MD;  Location: [facility];  Service: Ophthalmology;  Laterality: Right;  Lot # [id number] H [place]: 01:04.0 AP%: 24.4 CDE: 30.44   CATARACT EXTRACTION W/PHACO Left 03/14/2017   Procedure: CATARACT EXTRACTION PHACO AND INTRAOCULAR LENS PLACEMENT ([place])  Left;  Surgeon: Leandrew Koyanagi, MD;  Location: [place];  Service: Ophthalmology;  Laterality: Left;   CEA     Carotid stenosis found during workup for dysphagia,  [place]  2009   Dr. Hoover Brunette for cervical cord stenosis, C5-6 diskectomy   CHOLECYSTECTOMY  2002   Dr. Pat Patrick   CLOSED MANIPULATION SHOULDER     left shoulder post  physical therapy, Right shoulder redo (toggle bolts)   COLONOSCOPY  09/01/14   coronary angiography  01/2011   one occluded artery with collateralization, other arteries patent   CORONARY ARTERY BYPASS GRAFT  2006   s/p triple bypass surgery, [facility]   heart catherization  2016   HERNIA REPAIR  may 2011   Dr. Pat Patrick   hernia repair     LEFT HEART CATH AND CORONARY ANGIOGRAPHY N/A 06/07/2021   Procedure: LEFT HEART CATH AND CORONARY ANGIOGRAPHY;  Surgeon: Isaias Cowman, MD;  Location: [place] [facility];  Service: Cardiovascular;  Laterality: N/A;   LUMBAR DISC SURGERY  1971   L5, unssuccessful, fusion in 1983 successful (2 lumbar)   PARTIAL HYSTERECTOMY     ROTATOR CUFF REPAIR  2002   right shoulder Dr. Nadean Corwin, [facility] in [place]      Current Outpatient Medications  Medication Sig Dispense Refill   amLODipine (NORVASC) 5 MG tablet Take 5 mg by mouth daily.     aspirin 81 MG tablet Take 81 mg by mouth daily.     cholecalciferol (VITAMIN D) 1000 UNITS tablet Take 1,000 Units by mouth daily.     isosorbide mononitrate (IMDUR) 60 MG 24 hr tablet Take 1 tablet (60 mg total) by mouth daily. 30 tablet 0   metoprolol succinate (TOPROL-XL) 50 MG 24 hr tablet Take 1 tablet (50 mg total) by mouth daily. Take with or immediately following a meal. 90 tablet 1   Multiple Vitamins-Minerals (PRESERVISION AREDS 2 PO) Take by mouth 2 (two) times daily.     nitroGLYCERIN (NITROSTAT) 0.4 MG SL tablet Place 1 tablet (0.4 mg total) under the tongue every 5 (five) minutes as needed for chest pain. 50 tablet 4   omeprazole (PRILOSEC) 20 MG capsule TAKE 1 CAPSULE(20 MG) BY MOUTH DAILY 90 capsule 1   rosuvastatin (CRESTOR) 40 MG tablet Take 1 tablet (40 mg total) by mouth daily. 90 tablet 1   Turmeric (QC TUMERIC COMPLEX PO) Take 1 mg by mouth daily.     No current facility-administered medications for this visit.     Allergies:   Telmisartan, Niacin, Niacin and  related, Sertraline hcl, Sulfa antibiotics, Sulfa drugs cross reactors, Tetracycline, Tetracyclines & related, Tizanidine, Latex, and Simvastatin   Social History:  The patient  reports that she quit smoking about 10 years ago. Her smoking use included cigarettes. She has a 15.00 pack-year smoking history. She has never used smokeless tobacco. She reports that she does not drink alcohol and does not use drugs.   Family History:   family history includes Breast cancer in her sister; Cancer in her sister and sister; Diabetes in her mother, sister, and sister; Heart disease in her brother and mother; Other in her brother, father, and another family member.    Review of Systems: Review of Systems  Constitutional: Negative.   HENT: Negative.    Respiratory: Negative.    Cardiovascular:  Positive for chest pain.  Gastrointestinal: Negative.   Musculoskeletal: Negative.   Neurological: Negative.   Psychiatric/Behavioral: Negative.    All other systems reviewed and are negative.   PHYSICAL EXAM: VS:  BP 124/60 (BP Location: Left Arm, Patient Position: Sitting, Cuff Size: Normal)   Pulse 68  Ht '5\' 3"'$  (1.6 m)   Wt 149 lb 2 oz (67.6 kg)   SpO2 98%   BMI 26.42 kg/m  , BMI Body mass index is 26.42 kg/m. Constitutional:  oriented to person, place, and time. No distress.  HENT:  Head: Grossly normal Eyes:  no discharge. No scleral icterus.  Neck: No JVD, no carotid bruits  Cardiovascular: Regular rate and rhythm, no murmurs appreciated Pulmonary/Chest: Clear to auscultation bilaterally, no wheezes or rails Abdominal: Soft.  no distension.  no tenderness.  Musculoskeletal: Normal range of motion Neurological:  normal muscle tone. Coordination normal. No atrophy Skin: Skin warm and dry Psychiatric: normal affect, pleasant  Recent Labs: 12/05/2021: Magnesium 1.9; TSH 2.69 12/13/2021: ALT 14; BUN 27; Creatinine, Ser 1.11; Potassium 5.1; Sodium 141 01/17/2022: Hemoglobin 10.9; Platelets 178.0     Lipid Panel Lab Results  Component Value Date   CHOL 114 12/13/2021   HDL 36.20 (L) 12/13/2021   LDLCALC 40 12/13/2021   TRIG 191.0 (H) 12/13/2021    Wt Readings from Last 3 Encounters:  07/07/22 149 lb 2 oz (67.6 kg)  05/30/22 151 lb 12.8 oz (68.9 kg)  01/17/22 151 lb (68.5 kg)     ASSESSMENT AND PLAN:  Problem List Items Addressed This Visit       Cardiology Problems   AAA (abdominal aortic aneurysm) without rupture (HCC)   Angina pectoris ([place]) - Primary   Hyperlipidemia   CAD (coronary artery disease), autologous vein bypass graft   Bilateral carotid artery stenosis     Other   Pulmonary emphysema (HCC)   Coronary artery disease with stable angina History of CABG, catheterization August 2022 Coronary anatomy discussed Continues to have occasional anginal episodes requiring sublingual nitro  Case discussed with Dr. Fletcher Anon: On prior office visit "tight mid left circumflex stenosis feeding an OM branch that currently does not have a patent graft.  The lesion is heavily calcified and the vessel distally is tortuous and thus its not straightforward for PCI and will require atherectomy.  recommend maximizing antianginal therapy before considering high risk PCI. " For now we have recommended she start Ranexa 500 mg twice a day for 1 week then up to 1000 twice daily If continues to have anginal symptoms we will add isosorbide 30 mg in the morning in addition to 60 mg in the p.m. If unable to control anginal symptoms may need cardiac catheterization and high risk PCI [facility]  Hyperlipidemia Cholesterol is at goal on the current lipid regimen. No changes to the medications were made.  Type 2 diabetes with complications We have encouraged continued exercise, careful diet management Working with Dr. Derrel Nip  PAD History of carotid endarterectomy Stable disease December 2022    Total encounter time more than 30 minutes  Greater than 50% was spent in counseling and  coordination of care with the patient    Signed, Esmond Plants, M.D., Ph.D. [place], [place]

## 2022-07-11 ENCOUNTER — Ambulatory Visit
Admission: RE | Admit: 2022-07-11 | Discharge: 2022-07-11 | Disposition: A | Discharge status: Home / Self Care | Payer: Medicare Other | Source: Ambulatory Visit | Attending: Internal Medicine | Admitting: Internal Medicine

## 2022-07-11 DIAGNOSIS — Z1231 Encounter for screening mammogram for malignant neoplasm of breast: Secondary | ICD-10-CM | POA: Diagnosis not present

## 2022-07-13 ENCOUNTER — Telehealth: Payer: Self-pay | Admitting: Cardiovascular Disease

## 2022-07-13 NOTE — Telephone Encounter (Signed)
  Pt c/o medication issue:  1. Name of Medication: ranolazine (RANEXA) 1000 MG SR tablet  2. How are you currently taking this medication (dosage and times per day)? Take 1 tablet (1,000 mg total) by mouth 2 (two) times daily.  3. Are you having a reaction (difficulty breathing--STAT)? no  4. What is your medication issue? Pt said, she went to pick up this medication and it cost $520 for 90 days. She said, she cant afford it. She tried goodrx and it only gave $20 discount

## 2022-07-13 NOTE — Telephone Encounter (Signed)
Update fwd to Dr. Rockey Situ to advise.

## 2022-07-17 ENCOUNTER — Other Ambulatory Visit: Payer: Medicare Other

## 2022-07-17 ENCOUNTER — Other Ambulatory Visit (INDEPENDENT_AMBULATORY_CARE_PROVIDER_SITE_OTHER): Payer: Medicare Other

## 2022-07-17 DIAGNOSIS — E7800 Pure hypercholesterolemia, unspecified: Secondary | ICD-10-CM | POA: Diagnosis not present

## 2022-07-17 DIAGNOSIS — E1140 Type 2 diabetes mellitus with diabetic neuropathy, unspecified: Secondary | ICD-10-CM

## 2022-07-17 LAB — COMPREHENSIVE METABOLIC PANEL
ALT: 16 U/L (ref 0–35)
AST: 33 U/L (ref 0–37)
Albumin: 4.4 g/dL (ref 3.5–5.2)
Alkaline Phosphatase: 68 U/L (ref 39–117)
BUN: 23 mg/dL (ref 6–23)
CO2: 28 mEq/L (ref 19–32)
Calcium: 9.3 mg/dL (ref 8.4–10.5)
Chloride: 107 mEq/L (ref 96–112)
Creatinine, Ser: 1.05 mg/dL (ref 0.40–1.20)
GFR: 50.31 mL/min — ABNORMAL LOW (ref >60.00)
Glucose, Bld: 110 mg/dL — ABNORMAL HIGH (ref 70–99)
Potassium: 4.4 mEq/L (ref 3.5–5.1)
Sodium: 142 mEq/L (ref 135–145)
Total Bilirubin: 0.5 mg/dL (ref 0.2–1.2)
Total Protein: 6.8 g/dL (ref 6.0–8.3)

## 2022-07-17 LAB — LIPID PANEL
Cholesterol: 105 mg/dL (ref 0–200)
HDL: 36.1 mg/dL — ABNORMAL LOW (ref >39.00)
LDL Cholesterol: 43 mg/dL (ref 0–99)
NonHDL: 68.51
Total CHOL/HDL Ratio: 3
Triglycerides: 130 mg/dL (ref 0.0–149.0)
VLDL: 26 mg/dL (ref 0.0–40.0)

## 2022-07-17 LAB — MICROALBUMIN / CREATININE URINE RATIO
Creatinine,U: 73.5 mg/dL
Microalb Creat Ratio: 6.5 mg/g (ref 0.0–30.0)
Microalb, Ur: 4.8 mg/dL — ABNORMAL HIGH (ref 0.0–1.9)

## 2022-07-17 LAB — HEMOGLOBIN A1C: Hgb A1c MFr Bld: 7.1 % — ABNORMAL HIGH (ref 4.6–6.5)

## 2022-07-17 NOTE — Telephone Encounter (Signed)
I spoke with the patient. I have advised her of Good RX options for [facility] & [facility].   The patient inquired if I could call her insurance to see about the price being reduced.  I advised her that Ranolazine is basically in a class by itself, so there is nothing to compete with it as an alternative, so I do not know if a tier exception would be an option.  She then advised she has multiple drug intolerances, so I advised that I would try a [place] coupon at [facility] or [facility] for a 30-day supply to see how she tolerates. She is aware that [facility] is quoting a cheaper rate just for a 1st time fill, then the price will go up ~ $10-15 after that.   The patient states she is due to see Dr. Derrel Nip on Friday and would like to discuss with her, then she will be back in touch with [place].   She was very appreciative of the call today.

## 2022-07-17 NOTE — Telephone Encounter (Signed)
Reviewed Good RX as well. It looks like [facility] has a 1 x offer of $23.80. [facility] has a coupon for $30.53.   Attempted to call the patient. No answer- I left a message to please call back.

## 2022-07-17 NOTE — Telephone Encounter (Signed)
Minna Merritts, MD  Sent: Sun July 16, 2022  2:00 PM  To: [facility] Triage          Message  I am looking at good Rx and there are some good options  [facility] $23, [facility] $30  Would 1 of these work for her?  Thx  TG

## 2022-07-19 ENCOUNTER — Ambulatory Visit: Payer: Medicare Other | Admitting: Internal Medicine

## 2022-07-21 ENCOUNTER — Encounter: Payer: Self-pay | Admitting: Internal Medicine

## 2022-07-21 ENCOUNTER — Ambulatory Visit (INDEPENDENT_AMBULATORY_CARE_PROVIDER_SITE_OTHER): Payer: Medicare Other | Admitting: Internal Medicine

## 2022-07-21 VITALS — BP 130/60 | HR 64 | Temp 97.9°F | Resp 14 | Ht 63.0 in | Wt 147.0 lb

## 2022-07-21 DIAGNOSIS — Z23 Encounter for immunization: Secondary | ICD-10-CM

## 2022-07-21 DIAGNOSIS — J432 Centrilobular emphysema: Secondary | ICD-10-CM | POA: Diagnosis not present

## 2022-07-21 DIAGNOSIS — R944 Abnormal results of kidney function studies: Secondary | ICD-10-CM

## 2022-07-21 DIAGNOSIS — I25718 Atherosclerosis of autologous vein coronary artery bypass graft(s) with other forms of angina pectoris: Secondary | ICD-10-CM | POA: Diagnosis not present

## 2022-07-21 DIAGNOSIS — I6523 Occlusion and stenosis of bilateral carotid arteries: Secondary | ICD-10-CM

## 2022-07-21 DIAGNOSIS — E1140 Type 2 diabetes mellitus with diabetic neuropathy, unspecified: Secondary | ICD-10-CM

## 2022-07-21 MED ORDER — RANOLAZINE ER 1000 MG PO TB12: 1000.0000 mg | ORAL_TABLET | Freq: Two times a day (BID) | ORAL | 6 refills | Status: DC

## 2022-07-21 MED ORDER — RSVPREF3 VAC RECOMB ADJUVANTED 120 MCG/0.5ML IM SUSR: 0.5000 mL | Freq: Once | INTRAMUSCULAR | 0 refills | Status: AC

## 2022-07-21 NOTE — Patient Instructions (Addendum)
START Ranexa 1000 mg: - take 0.5 tablet (500 mg) twice a day for one week,  then - take 1 tablet (1000 mg) twice daily  If the pharmacy runs this through insurance and it is too expsensive, then check [email or web address].com to check on a cash pay price at the various pharmacies   Our alternative is to increase the dose of Imdur     Because you are over 60 and have smoked,  the RSV vaccine is recommended. The office can provide the vaccine to anyone under the age of 78 who is not on Medicare.  [facility], [facility] and [facility] currently have the vaccine in stock

## 2022-07-21 NOTE — Progress Notes (Unsigned)
Patient ID: JAVIONNA LEDER, female    DOB: 07-31-1941  Age: 81 y.o. MRN: [id number]  The patient is here for follow up and management of other chronic and acute problems.   The risk factors are reflected in the social history.  The roster of all physicians providing medical care to patient - is listed in the Snapshot section of the chart.  Activities of daily living:  The patient is 100% independent in all ADLs: dressing, toileting, feeding as well as independent mobility  Home safety : The patient has smoke detectors in the home. They wear seatbelts.  There are no firearms at home. There is no violence in the home.   There is no risks for hepatitis, STDs or HIV. There is no   history of blood transfusion. They have no travel history to infectious disease endemic areas of the world.  The patient has seen their dentist in the last six month. They have seen their eye doctor in the last year. They admit to slight hearing difficulty with regard to whispered voices and some television programs.  They have deferred audiologic testing in the last year.  They do not  have excessive sun exposure. Discussed the need for sun protection: hats, long sleeves and use of sunscreen if there is significant sun exposure.   Diet: the importance of a healthy diet is discussed. They do have a healthy diet.  The benefits of regular aerobic exercise were discussed. She walks 4 times per week ,  20 minutes.   Depression screen: there are no signs or vegative symptoms of depression- irritability, change in appetite, anhedonia, sadness/tearfullness.  Cognitive assessment: the patient manages all their financial and personal affairs and is actively engaged. They could relate day,date,year and events; recalled 2/3 objects at 3 minutes; performed clock-face test normally.  The following portions of the patient's history were reviewed and updated as appropriate: allergies, current medications, past family history, past medical  history,  past surgical history, past social history  and problem list.  Visual acuity was not assessed per patient preference since she has regular follow up with her ophthalmologist. Hearing and body mass index were assessed and reviewed.   During the course of the visit the patient was educated and counseled about appropriate screening and preventive services including : fall prevention , diabetes screening, nutrition counseling, colorectal cancer screening, and recommended immunizations.    CC: There were no encounter diagnoses.  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 Still driving  3)  stress increased due house issues,  widowed .  Family in [place] and [place] has a past medical history of Abdominal aortic aneurysm without mention of rupture, Acute posthemorrhagic anemia, Arthritis, B12 deficiency, CAD (coronary artery disease), autologous vein bypass graft, Cardiac dysrhythmia, unspecified, Colon polyp, Depression, Diverticulitis of colon, Dizziness, Dysrhythmia, GERD (gastroesophageal reflux disease), HOH (hard of hearing), Hyperlipidemia, Hypertension, IBS (irritable bowel syndrome), Neuropathy, Neuropathy (07/01/2013), Peripheral vascular disease ([place]), Reflux esophagitis, Rheumatic fever, Sliding hiatal hernia, Tobacco abuse, and Tobacco abuse.   She has a past surgical history that includes Closed manipulation shoulder; Rotator cuff repair (2002); CEA; Lumbar disc surgery (1971); Abdominal surgery (1976); Cervical disc surgery (2009); Hernia repair (may 2011); Coronary artery bypass graft (2006); Cholecystectomy (2002); coronary angiography (01/2011); hernia repair; Appendectomy (1974); Colonoscopy (09/01/14); heart catherization (2016); Back surgery; Vascular surgery; Carotid endarterectomy; Cataract extraction w/PHACO (Right, 10/18/2016); Cataract extraction w/PHACO (Left, 03/14/2017); Partial hysterectomy; Abdominal hysterectomy; and  LEFT  HEART CATH AND CORONARY ANGIOGRAPHY (N/A, 06/07/2021).   Her family history includes Breast cancer in her sister; Cancer in her sister and sister; Diabetes in her mother, sister, and sister; Heart disease in her brother and mother; Other in her brother, father, and another family member.She reports that she quit smoking about 10 years ago. Her smoking use included cigarettes. She has a 15.00 pack-year smoking history. She has never used smokeless tobacco. She reports that she does not drink alcohol and does not use drugs.  Outpatient Medications Prior to Visit  Medication Sig Dispense Refill   amLODipine (NORVASC) 5 MG tablet Take 5 mg by mouth daily.     aspirin 81 MG tablet Take 81 mg by mouth daily.     cholecalciferol (VITAMIN D) 1000 UNITS tablet Take 1,000 Units by mouth daily.     isosorbide mononitrate (IMDUR) 60 MG 24 hr tablet Take 1 tablet (60 mg total) by mouth daily. 30 tablet 0   metoprolol succinate (TOPROL-XL) 50 MG 24 hr tablet Take 1 tablet (50 mg total) by mouth daily. Take with or immediately following a meal. 90 tablet 1   Multiple Vitamins-Minerals (PRESERVISION AREDS 2 PO) Take by mouth 2 (two) times daily.     nitroGLYCERIN (NITROSTAT) 0.4 MG SL tablet Place 1 tablet (0.4 mg total) under the tongue every 5 (five) minutes as needed for chest pain. 50 tablet 4   omeprazole (PRILOSEC) 20 MG capsule TAKE 1 CAPSULE(20 MG) BY MOUTH DAILY 90 capsule 1   rosuvastatin (CRESTOR) 40 MG tablet Take 1 tablet (40 mg total) by mouth daily. 90 tablet 1   Turmeric (QC TUMERIC COMPLEX PO) Take 1 mg by mouth daily.     ranolazine (RANEXA) 1000 MG SR tablet Take 1 tablet (1,000 mg total) by mouth 2 (two) times daily. (Patient not taking: Reported on 07/21/2022) 60 tablet 6   No facility-administered medications prior to visit.    Review of Systems  Objective:  BP 130/60 (BP Location: Left Arm, Patient Position: Sitting, Cuff Size: Normal)   Pulse 64   Temp 97.9 F (36.6 C) (Oral)   Resp  14   Ht '5\' 3"'$  (1.6 m)   Wt 147 lb (66.7 kg)   SpO2 99%   BMI 26.04 kg/m   Physical Exam  Physical Exam   Assessment & Plan:   Problem List Items Addressed This Visit   None   I am having Nykia H. Stacy maintain her cholecalciferol, aspirin, Multiple Vitamins-Minerals (PRESERVISION AREDS 2 PO), Turmeric (QC TUMERIC COMPLEX PO), isosorbide mononitrate, amLODipine, metoprolol succinate, rosuvastatin, nitroGLYCERIN, omeprazole, and ranolazine.  Meds ordered this encounter  Medications   ranolazine (RANEXA) 1000 MG SR tablet    Sig: Take 1 tablet (1,000 mg total) by mouth 2 (two) times daily.    Dispense:  60 tablet    Refill:  6    Medications Discontinued During This Encounter  Medication Reason   ranolazine (RANEXA) 1000 MG SR tablet Reorder    Follow-up: No follow-ups on file.   Crecencio Mc, MD

## 2022-07-22 ENCOUNTER — Encounter: Payer: Self-pay | Admitting: Internal Medicine

## 2022-07-22 NOTE — Assessment & Plan Note (Signed)
A1c is stable  and she nas new onset microalbuminuria. She has declined trial of SGLT 2inhibitor .Given her history of hypotension ,  Will postpone addition of  ARB  Until next visit. Patient is up-to-date on eye exams . continue asa and crestor.  Lab Results  Component Value Date   HGBA1C 7.1 (H) 07/17/2022   Lab Results  Component Value Date   MICROALBUR 4.8 (H) 07/17/2022   Lab Results  Component Value Date   CREATININE 1.05 07/17/2022

## 2022-07-22 NOTE — Assessment & Plan Note (Signed)
Reviewed recent chest x ray with patient that suggested  by increased AP diameter,  Flattened hemidiaphragms and large lung volumes, and history of tobacco abuse.  She is asymptomatic,  Recommend RSV vaccine

## 2022-07-22 NOTE — Assessment & Plan Note (Addendum)
She has has a second opinion from [place] regarding her recurrent chest pain.  She was prescribed Raneexa but has been unable to start it due to cost. . She is tolerating metoprolol  75 mg once daily ( '50mg'$  tablet 1.5 tablets. ) to equal 75 mg once daily.

## 2022-07-22 NOTE — Assessment & Plan Note (Signed)
Not CKD per recent [facility] Nephrology evaluation     Lab Results  Component Value Date   CREATININE 1.05 07/17/2022

## 2022-07-26 ENCOUNTER — Telehealth: Payer: Self-pay

## 2022-07-26 NOTE — Patient Outreach (Signed)
  Care Coordination   07/26/2022 Name: Sabrina Montoya MRN: [id number] DOB: 1941/09/15   Care Coordination Outreach Attempts:  An unsuccessful telephone outreach was attempted today to offer the patient information about available care coordination services as a benefit of their health plan.   Follow Up Plan:  Additional outreach attempts will be made to offer the patient care coordination information and services.   Encounter Outcome:  No Answer  Care Coordination Interventions Activated:  No   Care Coordination Interventions:  No, not indicated    Quinn Plowman RN,BSN,CCM [place] [phone] direct line

## 2022-07-31 ENCOUNTER — Telehealth: Payer: Self-pay | Admitting: Cardiovascular Disease

## 2022-07-31 NOTE — Telephone Encounter (Signed)
Spoke with the patient who states that she is unable to cut Ranexa in half and they only filled her prescription for '1000mg'$  tablets. She will need 500 mg tablets to take twice daily for one week before increasing to 1000 mg twice daily there after. Advised patient that I would call this into her pharmacy. She will let us know if she has any trouble with the cost of this.

## 2022-07-31 NOTE — Telephone Encounter (Signed)
Pt c/o medication issue:  1. Name of Medication: ranolazine (RANEXA) 1000 MG SR tablet  2. How are you currently taking this medication (dosage and times per day)?   START Ranexa 1000 mg: - take 0.5 tablet (500 mg) twice a day for one week,  then - take 1 tablet (1000 mg) twice daily   3. Are you having a reaction (difficulty breathing--STAT)?   4. What is your medication issue?  Pt needs clarification on if she needs to take '500mg'$  for one week or two weeks. She also states that she picked up the '1000mg'$  but they cant be cut in half and wondering if Dr. Rockey Situ can write a prescription for '500mg'$ 

## 2022-08-01 ENCOUNTER — Telehealth: Payer: Self-pay

## 2022-08-01 NOTE — Patient Outreach (Signed)
  Care Coordination   Initial Visit Note   08/01/2022 Name: KHANDI KERNES MRN: [id number] DOB: 01/24/1941  LACONYA CLERE is a 81 y.o. year old female who sees Derrel Nip, Aris Everts, MD for primary care. I spoke with  Wynona Neat by phone today.  What matters to the patients health and wellness today?  Patient states she has attempted to contact her pharmacy for clarification of her new prescription Renexa. She states she wants to know home many she will receivie and why it cost more than her initial Renexa 1,000 mg prescription.  RNCM [facility] to [facility].  Spoke with pharmacist Joe who explained patients 500 mg prescription is for a 7 day supply.  He states he will use the discount card and prescription copay cost will be $14.19 instead of original cost of $41.23.    Patient notified that prescription is ready and of new prescription cost.  Patient verbalized her appreciation for the assistance.    Goals Addressed             This Visit's Progress    Care coordination activities - No follow up needed.       Care Coordination Interventions: Care coordination program/ services discussed  Social determinants of health survey completed Contact [facility] pharmacy to discuss patients concern regarding medication Ranexa Patient advised to contact primary care provider office  if care coordination services needed in the future           SDOH assessments and interventions completed:  Yes     Care Coordination Interventions Activated:  Yes  Care Coordination Interventions:  Yes, provided   Follow up plan: No further intervention required.   Encounter Outcome:  Pt. Visit Completed   Quinn Plowman RN,BSN,CCM [place] [phone] direct line

## 2022-08-04 NOTE — Telephone Encounter (Signed)
Returned the patients call. Advised her to hold Ranexa for now. I will fwd the update to Dr. Rockey Situ and we will give her a call back with his recommendation.  Patient verbalized understanding and voiced appreciation for the call back.

## 2022-08-04 NOTE — Telephone Encounter (Signed)
Pt c/o medication issue:  1. Name of Medication:  ranolazine (RANEXA) 1000 MG SR tablet  2. How are you currently taking this medication (dosage and times per day)?   3. Are you having a reaction (difficulty breathing--STAT)?   4. What is your medication issue?   Patient states yesterday around 11:00 AM she took one 500 MG tablet. She states at night she started to feel strange--unable to describe the feeling, but states she could tell something was wrong. She took her BP every 15 minutes (9/28): 105/62, 96/61, 85/48. She states she didn't take the medication last night and today (9/29) her BP was 140/82, 150/78.

## 2022-08-07 MED ORDER — ISOSORBIDE MONONITRATE ER 60 MG PO TB24: 60.0000 mg | ORAL_TABLET | Freq: Every day | ORAL | 3 refills | Status: DC

## 2022-08-07 NOTE — Addendum Note (Signed)
Addended by: Valora Corporal on: 08/07/2022 03:01 PM   Modules accepted: Orders

## 2022-08-07 NOTE — Telephone Encounter (Signed)
Patient reports that her blood pressures have been better since her last call. We reviewed providers recommendations and discussed how frequent she is checking her blood pressures. She was willing to give the medication one more try and would let us know how she tolerates. Encouraged her to check only twice a day and to keep a log of her readings with instructions to call us back if she should have any further questions.    Sabrina Merritts, MD  Sent: Sat August 05, 2022 10:21 PM  To: [facility] Triage    Message  Ranolazine (Ranexa) doesn't typically lower your blood pressure. Ranolazine (Ranexa) isn't like some other medications for angina that usually lower blood pressure, such as nitroglycerin sublingual tablets (Nitrostat) and beta blockers.  Perhaps she could try it 1 more time if she feels safe enough to do so  Would make sure she has had food and drink prior to taking it  Thx  TGollan

## 2022-08-07 NOTE — Telephone Encounter (Signed)
Left voicemail message to call back in regards to recommendations from Dr. Rockey Situ

## 2022-08-07 NOTE — Telephone Encounter (Signed)
Pt is returning call. Transferred to Pam, RN.  

## 2022-08-16 ENCOUNTER — Telehealth: Payer: Self-pay

## 2022-08-16 ENCOUNTER — Telehealth: Payer: Self-pay | Admitting: Cardiovascular Disease

## 2022-08-16 DIAGNOSIS — R112 Nausea with vomiting, unspecified: Secondary | ICD-10-CM

## 2022-08-16 NOTE — Telephone Encounter (Signed)
Please confirm how often she is taking it?  " Confirm that "As prescribed"   means 2 times daly.

## 2022-08-16 NOTE — Telephone Encounter (Signed)
Spoke with patient and she stated that as soon as she started taking the increased dose of Ranexa (1000 MG) she started having nausea and vomiting. She stated that she seemed to do much better on the 500 MG dose. I advised her to continue holding it for now and that I would send a message to Dr. Rockey Situ as well as Dr. Derrel Nip as she was the last prescriber. Patient was grateful for the follow up.

## 2022-08-16 NOTE — Telephone Encounter (Signed)
Pt c/o medication issue:  1. Name of Medication: ranolazine (RANEXA) 1000 MG SR tablet  2. How are you currently taking this medication (dosage and times per day)? As prescribed  3. Are you having a reaction (difficulty breathing--STAT)? Yes  4. What is your medication issue? Patient has had nausea and vomiting since starting this dosage on Sunday.

## 2022-08-16 NOTE — Telephone Encounter (Signed)
Patient states she has been taking ranolazine (RANEXA) 1000 MG SR tablet and it is causing her to vomit.  Patient states she was fine on the 500 MG.  Patient states she called Dr. Donivan Scull office and Dr. Donivan Scull nurse told her to stop taking it for now.  Patient states his nurse told her that she would try to speak with Dr. Rockey Situ about it today.  Patient states she would like to be sure Dr. Deborra Medina knows.  Patient states her urine has been dark since 08/14/2022, which is when she started taking the 1,000 MG tablet.  Patient states she does not want to mess up her kidneys.

## 2022-08-17 ENCOUNTER — Telehealth: Payer: Self-pay

## 2022-08-17 MED ORDER — RANOLAZINE ER 500 MG PO TB12: 500.0000 mg | ORAL_TABLET | Freq: Two times a day (BID) | ORAL | 0 refills | Status: DC

## 2022-08-17 NOTE — Telephone Encounter (Signed)
Spoke with pt and she stated that she has already increased her fluid intake and her urine has already started getting lighter. Pt also stated that she was taking Ranexa 1000 mg twice daily.

## 2022-08-17 NOTE — Telephone Encounter (Signed)
   Here are your instructions to patient from 07/21/22.  Patient Instructions by Crecencio Mc, MD9/15/2023 Addendum START Ranexa 1000 mg: - take 0.5 tablet (500 mg) twice a day for one week, then - take 1 tablet (1000 mg) twice daily    Ranexa cannot be crushed, broken or chewed so it was then prescribed at 500 MG BID: Antonieta Iba, RN  Registered Nurse Telephone Encounter Signed Encounter Date:  07/31/2022   Signed             Spoke with the patient who states that she is unable to cut Ranexa in half and they only filled her prescription for '1000mg'$  tablets. She will need 500 mg tablets to take twice daily for one week before increasing to 1000 mg twice daily there after. Advised patient that I would call this into her pharmacy. She will let us know if she has any trouble with the cost of this.        Electronically signed by Antonieta Iba, RN at 07/31/2022  4:53 PM

## 2022-08-17 NOTE — Telephone Encounter (Signed)
500 mg dose sent to pharmacy.  [facility] ordered ,  to be done on [place] [facility] A LAB VISIT

## 2022-08-17 NOTE — Addendum Note (Signed)
Addended by: Crecencio Mc on: 08/17/2022 09:36 AM   Modules accepted: Orders

## 2022-08-17 NOTE — Telephone Encounter (Signed)
I will send in the lower dose,  but I don't see that she was ever ON 500 mg unless she was given samples in your office.  (The medication I refilled was the one in the chart from Dr Rockey Situ, [place]  twice daily.

## 2022-08-17 NOTE — Telephone Encounter (Signed)
Called patient and informed her that Dr. Derrel Nip sent in the Ranexa 500 MG twice a day. She stated that she needed that sent to [facility] pharmacy because it is cheaper and she cannot afford it at [facility]. I sent the prescription there as requested.   Also informed patient that Dr. Derrel Nip would like a BMP, and a Hepatic Panel drawn tomorrow. Orders were entered for the [place] and patient verbalized understanding and agreed with plan.

## 2022-08-17 NOTE — Addendum Note (Signed)
Addended by: Crecencio Mc on: 08/17/2022 09:38 AM   Modules accepted: Orders

## 2022-08-17 NOTE — Telephone Encounter (Signed)
Patient states she has not heard back yet as to whether she should restart ranolazine. She states she ate this morning and feels much better. She did not vomit after eating. Please advise. Thank you!

## 2022-08-17 NOTE — Telephone Encounter (Signed)
Encounter opened erroneously.

## 2022-08-17 NOTE — Addendum Note (Signed)
Addended by: Kavin Leech on: 08/17/2022 01:20 PM   Modules accepted: Orders

## 2022-08-17 NOTE — Telephone Encounter (Signed)
Dr. Derrel Nip-   Can I re-order the BNP and Hepatic panel for our lab here, or I can have patient call your office to schedule there.  [facility] is open M-F from 8AM-6PM  no apt is necessary.

## 2022-08-18 ENCOUNTER — Other Ambulatory Visit (INDEPENDENT_AMBULATORY_CARE_PROVIDER_SITE_OTHER): Payer: Medicare Other

## 2022-08-18 DIAGNOSIS — R112 Nausea with vomiting, unspecified: Secondary | ICD-10-CM | POA: Diagnosis not present

## 2022-08-18 LAB — BASIC METABOLIC PANEL
BUN: 25 mg/dL — ABNORMAL HIGH (ref 6–23)
CO2: 26 mEq/L (ref 19–32)
Calcium: 9.5 mg/dL (ref 8.4–10.5)
Chloride: 104 mEq/L (ref 96–112)
Creatinine, Ser: 1.27 mg/dL — ABNORMAL HIGH (ref 0.40–1.20)
GFR: 40.02 mL/min — ABNORMAL LOW (ref >60.00)
Glucose, Bld: 157 mg/dL — ABNORMAL HIGH (ref 70–99)
Potassium: 4.1 mEq/L (ref 3.5–5.1)
Sodium: 139 mEq/L (ref 135–145)

## 2022-08-18 LAB — HEPATIC FUNCTION PANEL
ALT: 16 U/L (ref 0–35)
AST: 32 U/L (ref 0–37)
Albumin: 4.5 g/dL (ref 3.5–5.2)
Alkaline Phosphatase: 68 U/L (ref 39–117)
Bilirubin, Direct: 0.1 mg/dL (ref 0.0–0.3)
Total Bilirubin: 0.5 mg/dL (ref 0.2–1.2)
Total Protein: 7.3 g/dL (ref 6.0–8.3)

## 2022-08-18 NOTE — Addendum Note (Signed)
Addended by: Leeanne Rio on: 08/18/2022 10:47 AM   Modules accepted: Orders

## 2022-08-18 NOTE — Addendum Note (Signed)
Addended by: Leeanne Rio on: 08/18/2022 10:48 AM   Modules accepted: Orders

## 2022-08-29 DIAGNOSIS — H40053 Ocular hypertension, bilateral: Secondary | ICD-10-CM | POA: Diagnosis not present

## 2022-08-29 DIAGNOSIS — H353132 Nonexudative age-related macular degeneration, bilateral, intermediate dry stage: Secondary | ICD-10-CM | POA: Diagnosis not present

## 2022-08-29 DIAGNOSIS — Z961 Presence of intraocular lens: Secondary | ICD-10-CM | POA: Diagnosis not present

## 2022-09-04 ENCOUNTER — Encounter (INDEPENDENT_AMBULATORY_CARE_PROVIDER_SITE_OTHER): Payer: Self-pay

## 2022-09-17 ENCOUNTER — Other Ambulatory Visit: Payer: Self-pay | Admitting: Internal Medicine

## 2022-09-18 ENCOUNTER — Emergency Department
Admission: EM | Admit: 2022-09-18 | Discharge: 2022-09-19 | Disposition: A | Discharge status: Home / Self Care | Payer: Medicare Other | Attending: Emergency Medicine | Admitting: Emergency Medicine

## 2022-09-18 ENCOUNTER — Other Ambulatory Visit: Payer: Self-pay

## 2022-09-18 ENCOUNTER — Encounter: Payer: Self-pay | Admitting: Emergency Medicine

## 2022-09-18 ENCOUNTER — Emergency Department: Payer: Medicare Other

## 2022-09-18 ENCOUNTER — Telehealth: Payer: Self-pay | Admitting: Internal Medicine

## 2022-09-18 DIAGNOSIS — I10 Essential (primary) hypertension: Secondary | ICD-10-CM | POA: Diagnosis not present

## 2022-09-18 DIAGNOSIS — R079 Chest pain, unspecified: Secondary | ICD-10-CM | POA: Diagnosis not present

## 2022-09-18 DIAGNOSIS — R059 Cough, unspecified: Secondary | ICD-10-CM | POA: Diagnosis not present

## 2022-09-18 DIAGNOSIS — Z1152 Encounter for screening for COVID-19: Secondary | ICD-10-CM | POA: Diagnosis not present

## 2022-09-18 DIAGNOSIS — J069 Acute upper respiratory infection, unspecified: Secondary | ICD-10-CM | POA: Diagnosis not present

## 2022-09-18 DIAGNOSIS — J3489 Other specified disorders of nose and nasal sinuses: Secondary | ICD-10-CM | POA: Diagnosis not present

## 2022-09-18 DIAGNOSIS — R0789 Other chest pain: Secondary | ICD-10-CM | POA: Diagnosis not present

## 2022-09-18 DIAGNOSIS — R0602 Shortness of breath: Secondary | ICD-10-CM | POA: Diagnosis not present

## 2022-09-18 DIAGNOSIS — R0981 Nasal congestion: Secondary | ICD-10-CM | POA: Diagnosis not present

## 2022-09-18 LAB — COMPREHENSIVE METABOLIC PANEL
ALT: 22 U/L (ref 0–44)
AST: 44 U/L — ABNORMAL HIGH (ref 15–41)
Albumin: 4.2 g/dL (ref 3.5–5.0)
Alkaline Phosphatase: 66 U/L (ref 38–126)
Anion gap: 9 (ref 5–15)
BUN: 29 mg/dL — ABNORMAL HIGH (ref 8–23)
CO2: 22 mmol/L (ref 22–32)
Calcium: 9.3 mg/dL (ref 8.9–10.3)
Chloride: 108 mmol/L (ref 98–111)
Creatinine, Ser: 1.17 mg/dL — ABNORMAL HIGH (ref 0.44–1.00)
GFR, Estimated: 47 mL/min — ABNORMAL LOW (ref >60)
Glucose, Bld: 117 mg/dL — ABNORMAL HIGH (ref 70–99)
Potassium: 4.1 mmol/L (ref 3.5–5.1)
Sodium: 139 mmol/L (ref 135–145)
Total Bilirubin: 0.8 mg/dL (ref 0.3–1.2)
Total Protein: 7.1 g/dL (ref 6.5–8.1)

## 2022-09-18 LAB — CBC WITH DIFFERENTIAL/PLATELET
Abs Immature Granulocytes: 0.01 10*3/uL (ref 0.00–0.07)
Basophils Absolute: 0 10*3/uL (ref 0.0–0.1)
Basophils Relative: 0 %
Eosinophils Absolute: 0.1 10*3/uL (ref 0.0–0.5)
Eosinophils Relative: 2 %
HCT: 33.4 % — ABNORMAL LOW (ref 36.0–46.0)
Hemoglobin: 11 g/dL — ABNORMAL LOW (ref 12.0–15.0)
Immature Granulocytes: 0 %
Lymphocytes Relative: 26 %
Lymphs Abs: 1.2 10*3/uL (ref 0.7–4.0)
MCH: 29.1 pg (ref 26.0–34.0)
MCHC: 32.9 g/dL (ref 30.0–36.0)
MCV: 88.4 fL (ref 80.0–100.0)
Monocytes Absolute: 0.4 10*3/uL (ref 0.1–1.0)
Monocytes Relative: 9 %
Neutro Abs: 2.9 10*3/uL (ref 1.7–7.7)
Neutrophils Relative %: 63 %
Platelets: 164 10*3/uL (ref 150–400)
RBC: 3.78 MIL/uL — ABNORMAL LOW (ref 3.87–5.11)
RDW: 13.9 % (ref 11.5–15.5)
WBC: 4.7 10*3/uL (ref 4.0–10.5)
nRBC: 0 % (ref 0.0–0.2)

## 2022-09-18 LAB — RESP PANEL BY RT-PCR (FLU A&B, COVID) ARPGX2
Influenza A by PCR: NEGATIVE
Influenza B by PCR: NEGATIVE
SARS Coronavirus 2 by RT PCR: NEGATIVE

## 2022-09-18 LAB — TROPONIN I (HIGH SENSITIVITY)
Troponin I (High Sensitivity): 5 ng/L (ref <18)
Troponin I (High Sensitivity): 6 ng/L (ref <18)

## 2022-09-18 NOTE — ED Provider Triage Note (Signed)
Emergency Medicine Provider Triage Evaluation Note  Sabrina Montoya , a 81 y.o. female  was evaluated in triage.  Pt complains of cough, mild chest pain. Extensive cardiac HX. Recent exposure to someone with cold like symptoms. Cough x 1 week before CP started. Feels mild SOB with activity.  Review of Systems  Positive: CP, cough, sob Negative: Fever, chills, abd symptoms  Physical Exam  There were no vitals taken for this visit. Gen:   Awake, no distress   Resp:  Normal effort  MSK:   Moves extremities without difficulty  Other:    Medical Decision Making  Medically screening exam initiated at 5:10 PM.  Appropriate orders placed.  Sabrina Montoya was informed that the remainder of the evaluation will be completed by another provider, this initial triage assessment does not replace that evaluation, and the importance of remaining in the ED until their evaluation is complete.  CP, sob, cough. EKG, chest xray, covid/flu swab, labs   Sabrina Moll, PA-C 09/18/22 1710

## 2022-09-18 NOTE — Telephone Encounter (Signed)
Access Nurse transferred patient back to office. Access nurse recommends going to hospital. Patient was transferred to [place], [place]

## 2022-09-18 NOTE — Telephone Encounter (Signed)
Transferred to Access Nurse. The patient called stating she wanted to speak to a nurse. She stated she has a cough and rib soreness. She stated she is having trouble adjusting to a new medication.

## 2022-09-18 NOTE — Telephone Encounter (Signed)
Spoke with pt and stated that she was started on Ranexa. Since starting the Ranexa she has been nauseous and vomiting, not able to eat. Pt stated that she stopped the medication yesterday. Pt stated that the episode of chest pain that she had occurred last night. Pt was advised that due to her history of heart disease it is very important that she get a evaluated. Pt was advised to go to the ED. Pt stated that she would not go by ED but would drive herself there. Pt stated that she is not having any active chest pain and felt safe to drive herself. I advised pt that I would call her ina little while to follow up. Pt gave a verbal understanding.

## 2022-09-18 NOTE — ED Triage Notes (Signed)
Pt to ED via POV for cough and congestion as well as chest pain last night. Pt is not currently having chest pain. Pt states that there was someone sick at her work last week. Pt has significant cardiac history. Pt states that she is also having some shortness of breath.

## 2022-09-19 DIAGNOSIS — R0789 Other chest pain: Secondary | ICD-10-CM | POA: Diagnosis not present

## 2022-09-19 MED ORDER — ALBUTEROL SULFATE HFA 108 (90 BASE) MCG/ACT IN AERS: 2.0000 | INHALATION_SPRAY | Freq: Four times a day (QID) | RESPIRATORY_TRACT | 0 refills | Status: DC | PRN

## 2022-09-19 MED ORDER — AZITHROMYCIN 500 MG PO TABS
500.0000 mg | ORAL_TABLET | Freq: Once | ORAL | Status: AC
  Administered 2022-09-19: 500 mg via ORAL
  Filled 2022-09-19: qty 1

## 2022-09-19 MED ORDER — AEROCHAMBER PLUS FLO-VU MISC
1.0000 | Freq: Once | Status: AC
  Administered 2022-09-19: 1
  Filled 2022-09-19 (×3): qty 1

## 2022-09-19 MED ORDER — BENZONATATE 100 MG PO CAPS: 100.0000 mg | ORAL_CAPSULE | Freq: Three times a day (TID) | ORAL | 0 refills | Status: DC | PRN

## 2022-09-19 MED ORDER — DEXAMETHASONE 6 MG PO TABS
10.0000 mg | ORAL_TABLET | Freq: Once | ORAL | Status: AC
  Administered 2022-09-19: 10 mg via ORAL
  Filled 2022-09-19: qty 1

## 2022-09-19 MED ORDER — AZITHROMYCIN 250 MG PO TABS: 250.0000 mg | ORAL_TABLET | Freq: Every day | ORAL | 0 refills | Status: AC

## 2022-09-19 MED ORDER — ALBUTEROL SULFATE HFA 108 (90 BASE) MCG/ACT IN AERS
2.0000 | INHALATION_SPRAY | Freq: Once | RESPIRATORY_TRACT | Status: AC
  Administered 2022-09-19: 2 via RESPIRATORY_TRACT
  Filled 2022-09-19: qty 6.7

## 2022-09-19 NOTE — ED Provider Notes (Signed)
[facility] Provider Note    Event Date/Time   First MD Initiated Contact with Patient 09/19/22 0009     (approximate)   History   Cough and Chest Pain   HPI  Sabrina Montoya is a 81 y.o. female with cough and chest pain.  The patient states that over the last 2 days, she has had cough, wheezing, and chest pain.  She states the pain is an aching, throbbing sensation in her anterior chest.  She has a history of chronic chest pain and was recently started on ranolazine by her cardiologist.  She states that this made her very tired and she actually had stopped taking this.  The pain does feel similar to her chronic chest pain only worse.  She had some mild nasal congestion and rhinorrhea.  She has had known sick contacts in the home.  Denies known fevers.  No shortness of breath at rest.  No other complaints.  No rash.     Physical Exam   Triage Vital Signs: ED Triage Vitals  Enc Vitals Group     BP 09/18/22 1709 (!) 162/60     Pulse Rate 09/18/22 1709 64     Resp 09/18/22 1709 16     Temp 09/18/22 1709 98.7 F (37.1 C)     Temp Source 09/18/22 1709 Oral     SpO2 09/18/22 1709 98 %     Weight 09/18/22 1710 142 lb (64.4 kg)     Height 09/18/22 1710 '5\' 4"'$  (1.626 m)     Head Circumference --      Peak Flow --      Pain Score 09/18/22 1710 0     Pain Loc --      Pain Edu? --      Excl. in [place]? --     Most recent vital signs: Vitals:   09/19/22 0008 09/19/22 0130  BP: (!) 141/63 (!) 154/93  Pulse: (!) 57 (!) 57  Resp: 12 17  Temp: 98 F (36.7 C)   SpO2: 98% 100%     General: Awake, no distress.  CV:  Good peripheral perfusion.  Regular rate and rhythm. Resp:  Normal effort.  Lungs clear to auscultation bilaterally.  Scant wheezes noted with prolonged end expiration. Abd:  No distention.  Other:  No edema or asymmetry.   ED Results / Procedures / Treatments   Labs (all labs ordered are listed, but only abnormal results are displayed) Labs  Reviewed  COMPREHENSIVE METABOLIC PANEL - Abnormal; Notable for the following components:      Result Value   Glucose, Bld 117 (*)    BUN 29 (*)    Creatinine, Ser 1.17 (*)    AST 44 (*)    GFR, Estimated 47 (*)    All other components within normal limits  CBC WITH DIFFERENTIAL/PLATELET - Abnormal; Notable for the following components:   RBC 3.78 (*)    Hemoglobin 11.0 (*)    HCT 33.4 (*)    All other components within normal limits  RESP PANEL BY RT-PCR (FLU A&B, COVID) ARPGX2  TROPONIN I (HIGH SENSITIVITY)  TROPONIN I (HIGH SENSITIVITY)     EKG Normal sinus rhythm, ventricular rate 66.  PR 170, QRS 82, QTc 408.  No acute ST elevations or depressions.  No EKG evidence of acute ischemia infarct.   RADIOLOGY Chest x-ray: No acute intrathoracic process   I also independently reviewed and agree with radiologist interpretations.   PROCEDURES:  Critical Care performed: No  .1-3 Lead EKG Interpretation  Performed by: Duffy Bruce, MD Authorized by: Duffy Bruce, MD     Interpretation: non-specific     ECG rate:  50-60   ECG rate assessment: bradycardic     Rhythm: sinus bradycardia     Ectopy: none     Conduction: normal   Comments:     Indication: Chest pain     MEDICATIONS ORDERED IN ED: Medications  azithromycin (ZITHROMAX) tablet 500 mg (500 mg Oral Given 09/19/22 0126)  albuterol (VENTOLIN HFA) 108 (90 Base) MCG/ACT inhaler 2 puff (2 puffs Inhalation Given 09/19/22 0125)  aerochamber plus with mask device 1 each (1 each Other Given 09/19/22 0125)  dexamethasone (DECADRON) tablet 10 mg (10 mg Oral Given 09/19/22 0155)     IMPRESSION / MDM / ASSESSMENT AND PLAN / ED COURSE  I reviewed the triage vital signs and the nursing notes.                               This patient presents to the ED for concern of cough, chest pain, this involves an extensive number of treatment options, and is a complaint that carries with it a high risk of complications  and morbidity.  The differential diagnosis includes URI, musculoskeletal chest pain, pneumonia, ACS, PE, GERD, gastritis, anxiety   Co morbidities that complicate the patient evaluation  Hypertension    Additional history obtained:  Additional history obtained from NA External records from outside source obtained and reviewed including Cardiology notes from Dr. Rockey Situ, [facility] notes with Dr. Derrel Nip   Lab Tests:  I Ordered, and personally interpreted labs.  The pertinent results include:   CMP unremarkable, baseline normal renal function CBC without leukocytosis or anemia beyond her baseline Trop negative COVID negative   Imaging Studies ordered:  I ordered imaging studies including CXR  I independently visualized and interpreted imaging which showed: CXR unremarkable I agree with the radiologist interpretation   Cardiac Monitoring: / EKG:  The patient was maintained on a cardiac monitor.  I personally viewed and interpreted the cardiac monitored which showed an underlying rhythm of: normal sinus rhythm   Problem List / ED Course / Critical interventions / Medication management  Chest pain/sob/cough Clinically, suspect URI vs early mild CAP in setting of sick contacts and cough, with chest wall pain. Pt does have cardiac history and h/o recurrent CP for which she sees Cardiology. EKG is nonischemic and trop neg x 2 - do not suspect her current pain is ischemic. She is not hypoxic, tachycardic and has no signs of PE. CXR is clear. Labs reassuring. Will treat with supportive care for her pain and abx for possible infectious component. Return precautions given. I have reviewed the patients home medicines and have made adjustments as needed   Social Determinants of Health:  N/A   Test / Admission - Considered:  Pt is well appearing, satting well on RA, with reassuring vitals and labs - will manage as outpt at this time.   FINAL CLINICAL IMPRESSION(S) / ED DIAGNOSES    Final diagnoses:  Atypical chest pain  Upper respiratory tract infection, unspecified type     Rx / DC Orders   ED Discharge Orders          Ordered    azithromycin (ZITHROMAX Z-PAK) 250 MG tablet  Daily        09/19/22 0120    albuterol (VENTOLIN  HFA) 108 (90 Base) MCG/ACT inhaler  Every 6 hours PRN        09/19/22 0120    benzonatate (TESSALON PERLES) 100 MG capsule  3 times daily PRN        09/19/22 0120             Note:  This document was prepared using Dragon voice recognition software and may include unintentional dictation errors.   Duffy Bruce, MD 09/19/22 [phone]

## 2022-09-21 ENCOUNTER — Telehealth: Payer: Self-pay | Admitting: *Deleted

## 2022-09-21 DIAGNOSIS — J069 Acute upper respiratory infection, unspecified: Secondary | ICD-10-CM

## 2022-09-21 NOTE — Patient Outreach (Signed)
  Care Coordination [facility] Note Transition Care Management Follow-up Telephone Call Date of discharge and from where: [id number] [facility] How have you been since you were released from the hospital? I feel worn out so tired. Any questions or concerns? No  Items Reviewed: Did the pt receive and understand the discharge instructions provided? Yes  Medications obtained and verified? Yes  Other? No  Any new allergies since your discharge? No  Dietary orders reviewed? No Do you have support at home? Yes   Home Care and Equipment/Supplies: Were home health services ordered? not applicable If so, what is the name of the agency? N/a  Has the agency set up a time to come to the patient's home? no Were any new equipment or medical supplies ordered?  No What is the name of the medical supply agency? N/a Were you able to get the supplies/equipment? not applicable Do you have any questions related to the use of the equipment or supplies? No  Functional Questionnaire: (I = Independent and D = Dependent) ADLs: I  Bathing/Dressing- I  Meal Prep- I  Eating- I  Maintaining continence- I  Transferring/Ambulation- I  Managing Meds- I  Follow up appointments reviewed:  PCP Hospital f/u appt confirmed? No  Patient to follow up with Dr Derrel Nip as needed.  [facility] f/u appt confirmed? No  . Are transportation arrangements needed? No  If their condition worsens, is the pt aware to call PCP or go to the Emergency Dept.? Yes Was the patient provided with contact information for the PCP's office or ED? Yes Was to pt encouraged to call back with questions or concerns? Yes  SDOH assessments and interventions completed:   Yes  Care Coordination Interventions Activated:  Yes   Care Coordination Interventions:  Referred for Care Coordination Services:  Pharmacist   Encounter Outcome:  Pt. Visit Completed    [facility] [place] [facility] [phone]

## 2022-09-25 ENCOUNTER — Telehealth: Payer: Self-pay

## 2022-09-25 NOTE — Telephone Encounter (Signed)
Spoke with pt and scheduled an ED follow up

## 2022-09-25 NOTE — Telephone Encounter (Signed)
Pt called stating she did go down to the ED to be evaluated given her atypical chest pain on 09/18/22. Per discharge notes pt was instructed to f/u w/PCP as needed. Pt states she is doing better but still trying to regain her energy back. Pt is asking for PCP to review ED notes & labs to see if she needs to come in to be seen for hosp f/u appt. Pt requesting callback....Marland KitchenMarland Kitchen

## 2022-09-25 NOTE — Telephone Encounter (Signed)
     Patient  visit on 11/14  at [place]    Have you been able to follow up with your primary care physician? Yes   The patient was or was not able to obtain any needed medicine or equipment. Yes   Are there diet recommendations that you are having difficulty following? Yes   Patient expresses understanding of discharge instructions and education provided has no other needs at this time.      [place], Care Management  [phone] [address], [place], [place] [address] Phone: [phone] Email: [email or web address]

## 2022-10-04 ENCOUNTER — Ambulatory Visit (INDEPENDENT_AMBULATORY_CARE_PROVIDER_SITE_OTHER): Payer: Medicare Other | Admitting: Internal Medicine

## 2022-10-04 ENCOUNTER — Encounter: Payer: Self-pay | Admitting: Internal Medicine

## 2022-10-04 VITALS — BP 126/64 | HR 66 | Temp 98.4°F | Ht 64.0 in | Wt 148.8 lb

## 2022-10-04 DIAGNOSIS — I6523 Occlusion and stenosis of bilateral carotid arteries: Secondary | ICD-10-CM | POA: Diagnosis not present

## 2022-10-04 DIAGNOSIS — I7143 Infrarenal abdominal aortic aneurysm, without rupture: Secondary | ICD-10-CM | POA: Diagnosis not present

## 2022-10-04 DIAGNOSIS — R944 Abnormal results of kidney function studies: Secondary | ICD-10-CM | POA: Diagnosis not present

## 2022-10-04 DIAGNOSIS — R1031 Right lower quadrant pain: Secondary | ICD-10-CM

## 2022-10-04 DIAGNOSIS — K581 Irritable bowel syndrome with constipation: Secondary | ICD-10-CM | POA: Diagnosis not present

## 2022-10-04 DIAGNOSIS — M47812 Spondylosis without myelopathy or radiculopathy, cervical region: Secondary | ICD-10-CM

## 2022-10-04 MED ORDER — NYSTATIN 100000 UNIT/ML MT SUSP: 5.0000 mL | Freq: Four times a day (QID) | OROMUCOSAL | 0 refills | Status: DC

## 2022-10-04 MED ORDER — TIZANIDINE HCL 2 MG PO TABS: 2.0000 mg | ORAL_TABLET | Freq: Every day | ORAL | 0 refills | Status: DC

## 2022-10-04 MED ORDER — LACTULOSE 20 GM/30ML PO SOLN: ORAL | 3 refills | Status: DC

## 2022-10-04 MED ORDER — RSVPREF3 VAC RECOMB ADJUVANTED 120 MCG/0.5ML IM SUSR: 0.5000 mL | Freq: Once | INTRAMUSCULAR | 0 refills | Status: AC

## 2022-10-04 NOTE — Progress Notes (Unsigned)
Subjective:  Patient ID: Sabrina Montoya, female    DOB: 09-12-1941  Age: 81 y.o. MRN: [id number]  CC: There were no encounter diagnoses.   HPI Sabrina Montoya presents for  Chief Complaint  Patient presents with   Follow-up    ED follow up    1) Chest pain: evaluated in ER  on Nov 13.  :"Clinically, suspect URI vs early mild CAP in setting of sick contacts and cough, with chest wall pain. Pt does have cardiac history and h/o recurrent CP for which she sees Cardiology. EKG is nonischemic and trop neg x 2 - do not suspect her current pain is ischemic. She is not hypoxic, tachycardic and has no signs of PE. CXR is clear. Labs reassuring. Will treat with supportive care for her pain and abx for possible infectious component. Return precautions given. "  Given azithromycin   Stopped Ranexa due to intolerance (fatigue)   For the past several weeks has had Having fecal urgency , stools are formed and occur several times daily,  followed by a loose explosive  stool. Not taking laxatives,  no probiotics.  Has chronic daily stools averaging 5 to 6 daily but not with urgency,  and TODAY   developed right lower  quadrant pain  that became so severe  it doubled her over.  Comes and goes  for the past 3 hours ,  stabbing in quality.  No fevers.  No nausea. PASSING AN excessive amount  gas .   Cervical disk disease:  prior ACDF.   Difficult to turn head at times especially while supine on pillow with neck roll.       Outpatient Medications Prior to Visit  Medication Sig Dispense Refill   albuterol (VENTOLIN HFA) 108 (90 Base) MCG/ACT inhaler Inhale 2 puffs into the lungs every 6 (six) hours as needed for wheezing or shortness of breath. 8 g 0   amLODipine (NORVASC) 5 MG tablet Take 5 mg by mouth daily.     aspirin 81 MG tablet Take 81 mg by mouth daily.     benzonatate (TESSALON PERLES) 100 MG capsule Take 1 capsule (100 mg total) by mouth 3 (three) times daily as needed for cough. 30 capsule 0    cholecalciferol (VITAMIN D) 1000 UNITS tablet Take 1,000 Units by mouth daily.     isosorbide mononitrate (IMDUR) 60 MG 24 hr tablet Take 1 tablet (60 mg total) by mouth daily. 90 tablet 3   metoprolol succinate (TOPROL-XL) 50 MG 24 hr tablet Take 1 tablet (50 mg total) by mouth daily. Take with or immediately following a meal. 90 tablet 1   Multiple Vitamins-Minerals (PRESERVISION AREDS 2 PO) Take by mouth 2 (two) times daily.     nitroGLYCERIN (NITROSTAT) 0.4 MG SL tablet Place 1 tablet (0.4 mg total) under the tongue every 5 (five) minutes as needed for chest pain. 50 tablet 4   omeprazole (PRILOSEC) 20 MG capsule TAKE 1 CAPSULE(20 MG) BY MOUTH DAILY 90 capsule 1   rosuvastatin (CRESTOR) 40 MG tablet Take 1 tablet (40 mg total) by mouth daily. 90 tablet 1   Turmeric (QC TUMERIC COMPLEX PO) Take 1 mg by mouth daily.     ranolazine (RANEXA) 500 MG 12 hr tablet Take 1 tablet (500 mg total) by mouth 2 (two) times daily. (Patient not taking: Reported on 10/04/2022) 60 tablet 0   No facility-administered medications prior to visit.    Review of Systems;  Patient denies headache, fevers, malaise, unintentional  weight loss, skin rash, eye pain, sinus congestion and sinus pain, sore throat, dysphagia,  hemoptysis , cough, dyspnea, wheezing, chest pain, palpitations, orthopnea, edema, abdominal pain, nausea, melena, diarrhea, constipation, flank pain, dysuria, hematuria, urinary  Frequency, nocturia, numbness, tingling, seizures,  Focal weakness, Loss of consciousness,  Tremor, insomnia, depression, anxiety, and suicidal ideation.      Objective:  BP 126/64   Pulse 66   Temp 98.4 F (36.9 C) (Oral)   Ht '5\' 4"'$  (1.626 m)   Wt 148 lb 12.8 oz (67.5 kg)   SpO2 98%   BMI 25.54 kg/m   BP Readings from Last 3 Encounters:  10/04/22 126/64  09/19/22 (!) 154/93  07/21/22 130/60    Wt Readings from Last 3 Encounters:  10/04/22 148 lb 12.8 oz (67.5 kg)  09/18/22 142 lb (64.4 kg)  07/21/22 147 lb  (66.7 kg)    General appearance: alert, cooperative and appears stated age Ears: normal TM's and external ear canals both ears Throat: lips, mucosa, and tongue normal; teeth and gums normal Neck: no adenopathy, no carotid bruit, supple, symmetrical, trachea midline and thyroid not enlarged, symmetric, no tenderness/mass/nodules Back: symmetric, no curvature. ROM normal. No CVA tenderness. Lungs: clear to auscultation bilaterally Heart: regular rate and rhythm, S1, S2 normal, no murmur, click, rub or gallop Abdomen: soft, non-tender; bowel sounds normal; no masses,  no organomegaly Pulses: 2+ and symmetric Skin: Skin color, texture, turgor normal. No rashes or lesions Lymph nodes: Cervical, supraclavicular, and axillary nodes normal. Neuro:  awake and interactive with normal mood and affect. Higher cortical functions are normal. Speech is clear without word-finding difficulty or dysarthria. Extraocular movements are intact. Visual fields of both eyes are grossly intact. Sensation to light touch is grossly intact bilaterally of upper and lower extremities. Motor examination shows 4+/5 symmetric hand grip and upper extremity and 5/5 lower extremity strength. There is no pronation or drift. Gait is non-ataxic   Lab Results  Component Value Date   HGBA1C 7.1 (H) 07/17/2022   HGBA1C 7.0 (H) 12/13/2021   HGBA1C 7.0 (H) 06/06/2021    Lab Results  Component Value Date   CREATININE 1.17 (H) 09/18/2022   CREATININE 1.27 (H) 08/18/2022   CREATININE 1.05 07/17/2022    Lab Results  Component Value Date   WBC 4.7 09/18/2022   HGB 11.0 (L) 09/18/2022   HCT 33.4 (L) 09/18/2022   PLT 164 09/18/2022   GLUCOSE 117 (H) 09/18/2022   CHOL 105 07/17/2022   TRIG 130.0 07/17/2022   HDL 36.10 (L) 07/17/2022   LDLDIRECT 48.0 04/18/2018   LDLCALC 43 07/17/2022   ALT 22 09/18/2022   AST 44 (H) 09/18/2022   NA 139 09/18/2022   K 4.1 09/18/2022   CL 108 09/18/2022   CREATININE 1.17 (H) 09/18/2022    BUN 29 (H) 09/18/2022   CO2 22 09/18/2022   TSH 2.69 12/05/2021   INR 1.1 06/06/2021   HGBA1C 7.1 (H) 07/17/2022   MICROALBUR 4.8 (H) 07/17/2022    DG Chest 2 View  Result Date: 09/18/2022 CLINICAL DATA:  Chest pain and cough for 1 week, short of breath EXAM: CHEST - 2 VIEW COMPARISON:  12/05/2021 FINDINGS: Frontal and lateral views of the chest demonstrate an unremarkable cardiac silhouette. Postsurgical changes from median sternotomy. Atherosclerosis of the aortic arch again noted. No acute airspace disease, effusion, or pneumothorax. There are no acute bony abnormalities. IMPRESSION: 1. No acute intrathoracic process. Electronically Signed   By: Randa Ngo M.D.   On: 09/18/2022  17:54    Assessment & Plan:   Problem List Items Addressed This Visit   None   I spent a total of   minutes with this patient in a face to face visit on the date of this encounter reviewing the last office visit with me in       ,  most recent visit with cardiology ,    ,  patient's diet and exercise habits, home blood pressure /blod sugar readings, recent ER visit including labs and imaging studies ,   and post visit ordering of testing and therapeutics.    Follow-up: No follow-ups on file.   Crecencio Mc, MD

## 2022-10-04 NOTE — Patient Instructions (Signed)
You ARE due for annual ultrasounds of your aorta, etc with Dr Bunnie Domino office  I am ordering a muscle relaxer for your neck,  and antifungal liquid for your mouth pain  And a CT scan of your intestines to rule out diverticulitis

## 2022-10-05 ENCOUNTER — Telehealth: Payer: Self-pay | Admitting: Internal Medicine

## 2022-10-05 ENCOUNTER — Ambulatory Visit
Admission: RE | Admit: 2022-10-05 | Discharge: 2022-10-05 | Disposition: A | Discharge status: Home / Self Care | Payer: Medicare Other | Source: Ambulatory Visit | Attending: Internal Medicine | Admitting: Internal Medicine

## 2022-10-05 DIAGNOSIS — R1031 Right lower quadrant pain: Secondary | ICD-10-CM | POA: Diagnosis not present

## 2022-10-05 DIAGNOSIS — I7143 Infrarenal abdominal aortic aneurysm, without rupture: Secondary | ICD-10-CM | POA: Diagnosis not present

## 2022-10-05 DIAGNOSIS — N281 Cyst of kidney, acquired: Secondary | ICD-10-CM | POA: Diagnosis not present

## 2022-10-05 DIAGNOSIS — M47812 Spondylosis without myelopathy or radiculopathy, cervical region: Secondary | ICD-10-CM | POA: Insufficient documentation

## 2022-10-05 MED ORDER — IOHEXOL 300 MG/ML  SOLN
100.0000 mL | Freq: Once | INTRAMUSCULAR | Status: AC | PRN
  Administered 2022-10-05: 100 mL via INTRAVENOUS

## 2022-10-05 NOTE — Assessment & Plan Note (Signed)
With prior ACDF/  current exam normal.  Rx muscle relaxer.  PT referral deferred

## 2022-10-05 NOTE — Assessment & Plan Note (Signed)
Difficult to determine if her current symptoms are due to IBS or something else. Given her report of  new onset severe pain  CT ab and pelvis ordered

## 2022-10-05 NOTE — Assessment & Plan Note (Signed)
Her last imaging was in March with no significant change.  Given her new onset RLQ pain will repeat CT scan with iV contrast

## 2022-10-05 NOTE — Telephone Encounter (Signed)
Lft pt vm to call ofc to sch STAT CT. thanks 

## 2022-10-05 NOTE — Assessment & Plan Note (Signed)
She does not have CKD per recent [facility] Nephrology evaluation     Lab Results  Component Value Date   CREATININE 1.17 (H) 09/18/2022

## 2022-10-17 NOTE — Progress Notes (Signed)
This encounter was created in error - please disregard.

## 2022-10-18 ENCOUNTER — Encounter (INDEPENDENT_AMBULATORY_CARE_PROVIDER_SITE_OTHER): Payer: Medicare Other

## 2022-10-18 ENCOUNTER — Other Ambulatory Visit (INDEPENDENT_AMBULATORY_CARE_PROVIDER_SITE_OTHER): Payer: Medicare Other

## 2022-10-18 ENCOUNTER — Ambulatory Visit (INDEPENDENT_AMBULATORY_CARE_PROVIDER_SITE_OTHER): Payer: Medicare Other | Admitting: Nurse Practitioner

## 2022-11-02 ENCOUNTER — Other Ambulatory Visit: Payer: Self-pay | Admitting: Internal Medicine

## 2022-11-14 ENCOUNTER — Other Ambulatory Visit (INDEPENDENT_AMBULATORY_CARE_PROVIDER_SITE_OTHER): Payer: Self-pay | Admitting: Vascular Surgery

## 2022-11-14 DIAGNOSIS — I739 Peripheral vascular disease, unspecified: Secondary | ICD-10-CM

## 2022-11-14 DIAGNOSIS — I714 Abdominal aortic aneurysm, without rupture, unspecified: Secondary | ICD-10-CM

## 2022-11-14 DIAGNOSIS — I6523 Occlusion and stenosis of bilateral carotid arteries: Secondary | ICD-10-CM

## 2022-11-15 ENCOUNTER — Ambulatory Visit (INDEPENDENT_AMBULATORY_CARE_PROVIDER_SITE_OTHER): Payer: Medicare Other

## 2022-11-15 ENCOUNTER — Encounter (INDEPENDENT_AMBULATORY_CARE_PROVIDER_SITE_OTHER): Payer: Self-pay | Admitting: Nurse Practitioner

## 2022-11-15 ENCOUNTER — Ambulatory Visit (INDEPENDENT_AMBULATORY_CARE_PROVIDER_SITE_OTHER): Payer: Medicare Other | Admitting: Nurse Practitioner

## 2022-11-15 VITALS — BP 123/67 | HR 65 | Ht 64.0 in | Wt 148.0 lb

## 2022-11-15 DIAGNOSIS — I714 Abdominal aortic aneurysm, without rupture, unspecified: Secondary | ICD-10-CM

## 2022-11-15 DIAGNOSIS — E782 Mixed hyperlipidemia: Secondary | ICD-10-CM | POA: Diagnosis not present

## 2022-11-15 DIAGNOSIS — I6523 Occlusion and stenosis of bilateral carotid arteries: Secondary | ICD-10-CM

## 2022-11-15 DIAGNOSIS — I739 Peripheral vascular disease, unspecified: Secondary | ICD-10-CM | POA: Diagnosis not present

## 2022-11-15 LAB — VAS US ABI WITH/WO TBI
Left ABI: 1.06
Right ABI: 1.07

## 2022-11-27 ENCOUNTER — Encounter (INDEPENDENT_AMBULATORY_CARE_PROVIDER_SITE_OTHER): Payer: Self-pay | Admitting: Nurse Practitioner

## 2022-11-27 NOTE — Progress Notes (Signed)
Subjective:    Patient ID: Sabrina Montoya, female    DOB: [birth year], 82 y.o.   MRN: [id number] Chief Complaint  Patient presents with   Follow-up    Patient returns today in follow up of multiple vascular issues.  She is having some neuropathy symptoms in both her feet as well as occasionally her hands.  Otherwise she is doing well without any complaints today.  She does not have any disabling claudication, rest pain, or ulceration.  She has no aneurysm related symptoms. Specifically, the patient denies new back or abdominal pain, or signs of peripheral embolization.  We performed noninvasive studies of the lower extremities and her aorta today.  As well as her carotid arteries.  There is been no TIA or amaurosis fugax-like symptoms.  She had a left carotid endarterectomy currently 15 years ago.  Today noninvasive studies show abdominal aortic measurement at approximately 2.9 cm this is decreased from the previous measurement of 3.4 cm in 2022.  The patient has a 40 to 59% stenosis in the right ICA with a 1 to 39% stenosis of the left ICA.  Normal flow hemodynamics in the bilateral subclavian arteries with the bilateral vertebral arteries demonstrate antegrade flow.  Right ABI is 1.07 with a left 1.06.  The patient has triphasic tibial artery waveforms bilaterally with normal toe waveforms bilaterally.    Review of Systems  Musculoskeletal:  Positive for arthralgias.  All other systems reviewed and are negative.      Objective:   Physical Exam Vitals reviewed.  HENT:     Head: Normocephalic.  Cardiovascular:     Rate and Rhythm: Normal rate.     Pulses: Normal pulses.  Pulmonary:     Effort: Pulmonary effort is normal.  Skin:    General: Skin is warm and dry.  Neurological:     Mental Status: She is alert and oriented to person, place, and time.  Psychiatric:        Mood and Affect: Mood normal.        Behavior: Behavior normal.        Thought Content: Thought content normal.         Judgment: Judgment normal.     BP 123/67   Pulse 65   Ht '5\' 4"'$  (1.626 m)   Wt 148 lb (67.1 kg)   BMI 25.40 kg/m   Past Medical History:  Diagnosis Date   Abdominal aortic aneurysm without mention of rupture    infrarenal, stable, folllowed by Leotis Pain   Acute posthemorrhagic anemia    Arthritis    B12 deficiency    CAD (coronary artery disease), autologous vein bypass graft    Cardiac dysrhythmia, unspecified    Colon polyp    Dental infection 01/17/2022   Depression    Diverticulitis of colon    Dizziness    DUE TO MEDICINES   Dysrhythmia    GERD (gastroesophageal reflux disease)    HOH (hard of hearing)    Hyperlipidemia    Hypertension    pt denies. placed on meds after CABG   IBS (irritable bowel syndrome)    Neuropathy    Neuropathy 07/01/2013   Peripheral vascular disease ([place])    s/p CEA    Reflux esophagitis    Rheumatic fever    possible at age 62   Sliding hiatal hernia    Tobacco abuse    Tobacco abuse     Social History   Socioeconomic History   Marital status:  Widowed    Spouse name: Not on file   Number of children: 2   Years of education: 84   Highest education level: 12th grade  Occupational History   Not on file  Tobacco Use   Smoking status: Former    Packs/day: 0.50    Years: 30.00    Total pack years: 15.00    Types: Cigarettes    Quit date: 04/01/2012    Years since quitting: 10.6   Smokeless tobacco: Never   Tobacco comments:    quit for 2 years after sinus infection and 6 months after heart surgery  Vaping Use   Vaping Use: Never used  Substance and Sexual Activity   Alcohol use: No   Drug use: No   Sexual activity: Not Currently  Other Topics Concern   Not on file  Social History Narrative   Widowed   Social Determinants of Health   Financial Resource Strain: Low Risk  (12/28/2021)   Overall Financial Resource Strain (CARDIA)    Difficulty of Paying Living Expenses: Not hard at all  Food Insecurity: No Food  Insecurity (09/21/2022)   Hunger Vital Sign    Worried About Running Out of Food in the Last Year: Never true    Ran Out of Food in the Last Year: Never true  Transportation Needs: No Transportation Needs (09/21/2022)   PRAPARE - Hydrologist (Medical): No    Lack of Transportation (Non-Medical): No  Physical Activity: Inactive (07/22/2021)   Exercise Vital Sign    Days of Exercise per Week: 0 days    Minutes of Exercise per Session: 0 min  Stress: Stress Concern Present (07/22/2021)   [place]    Feeling of Stress : Very much  Social Connections: Moderately Isolated (07/22/2021)   Social Connection and Isolation Panel [NHANES]    Frequency of Communication with Friends and Family: More than three times a week    Frequency of Social Gatherings with Friends and Family: More than three times a week    Attends Religious Services: 1 to 4 times per year    Active Member of [employer] or Organizations: No    Attends Archivist Meetings: Never    Marital Status: Widowed  Intimate Partner Violence: Not At Risk (07/22/2021)   Humiliation, Afraid, Rape, and Kick questionnaire    Fear of Current or Ex-Partner: No    Emotionally Abused: No    Physically Abused: No    Sexually Abused: No    Past Surgical History:  Procedure Laterality Date   ABDOMINAL HYSTERECTOMY     ABDOMINAL SURGERY  1976   for pain secondary to scar tissue, s/p apply   APPENDECTOMY  1974   BACK SURGERY     CAROTID ENDARTERECTOMY     CATARACT EXTRACTION W/PHACO Right 10/18/2016   Procedure: CATARACT EXTRACTION PHACO AND INTRAOCULAR LENS PLACEMENT ([place]);  Surgeon: Estill Cotta, MD;  Location: [facility];  Service: Ophthalmology;  Laterality: Right;  Lot # [id number] H [place]: 01:04.0 AP%: 24.4 CDE: 30.44   CATARACT EXTRACTION W/PHACO Left 03/14/2017   Procedure: CATARACT EXTRACTION PHACO AND INTRAOCULAR LENS PLACEMENT ([place])   Left;  Surgeon: Leandrew Koyanagi, MD;  Location: [facility];  Service: Ophthalmology;  Laterality: Left;   CEA     Carotid stenosis found during workup for dysphagia,  [place]  2009   Dr. Hoover Brunette for cervical cord stenosis, C5-6 diskectomy  CHOLECYSTECTOMY  2002   Dr. Pat Patrick   CLOSED MANIPULATION SHOULDER     left shoulder post physical therapy, Right shoulder redo (toggle bolts)   COLONOSCOPY  09/01/14   coronary angiography  01/2011   one occluded artery with collateralization, other arteries patent   CORONARY ARTERY BYPASS GRAFT  2006   s/p triple bypass surgery, [facility]   heart catherization  2016   HERNIA REPAIR  may 2011   Dr. Pat Patrick   hernia repair     LEFT HEART CATH AND CORONARY ANGIOGRAPHY N/A 06/07/2021   Procedure: LEFT HEART CATH AND CORONARY ANGIOGRAPHY;  Surgeon: Isaias Cowman, MD;  Location: [facility];  Service: Cardiovascular;  Laterality: N/A;   LUMBAR DISC SURGERY  1971   L5, unssuccessful, fusion in 1983 successful (2 lumbar)   PARTIAL HYSTERECTOMY     ROTATOR CUFF REPAIR  2002   right shoulder Dr. Nadean Corwin, [facility] in [place]      Family History  Problem Relation Age of Onset   Diabetes Mother    Heart disease Mother    Other Father        suicide   Cancer Sister        cervical cancer   Diabetes Sister    Diabetes Sister    Cancer Sister        breast   Breast cancer Sister    Heart disease Brother        coronary artery disease   Other Brother        suicide   Other Other        colon resection due to inflammation -nephew   Bladder Cancer Neg Hx    Kidney cancer Neg Hx     Allergies  Allergen Reactions   Telmisartan Other (See Comments)    Acute renal failure Renal issues   Niacin Other (See Comments)   Niacin And Related Hives   Sertraline Hcl Other (See Comments)    Hallucinations,     Sulfa Antibiotics Other (See Comments)   Sulfa Drugs Cross  Reactors Nausea Only   Tetracycline Other (See Comments)   Tetracyclines & Related Hives   Tizanidine Other (See Comments)    Issues with kidney function   Latex Rash and Other (See Comments)    RAST testing was NEGATIVE  for LATEX   Simvastatin Rash and Other (See Comments)    *Antihyperlipidemics*; elevated LFT's.        Latest Ref Rng & Units 09/18/2022    5:21 PM 01/17/2022    2:04 PM 12/05/2021   11:26 AM  CBC  WBC 4.0 - 10.5 K/uL 4.7  4.1  5.0   Hemoglobin 12.0 - 15.0 g/dL 11.0  10.9  10.5   Hematocrit 36.0 - 46.0 % 33.4  31.8  31.0   Platelets 150 - 400 K/uL 164  178.0  194.0       CMP     Component Value Date/Time   NA 139 09/18/2022 1721   NA 142 05/25/2014 1059   K 4.1 09/18/2022 1721   K 4.0 12/09/2014 0951   CL 108 09/18/2022 1721   CL 106 05/25/2014 1059   CO2 22 09/18/2022 1721   CO2 29 05/25/2014 1059   GLUCOSE 117 (H) 09/18/2022 1721   GLUCOSE 110 (H) 05/25/2014 1059   BUN 29 (H) 09/18/2022 1721   BUN 26 (H) 05/25/2014 1059   CREATININE 1.17 (H) 09/18/2022 1721   CREATININE 1.16 12/09/2014  9150   CALCIUM 9.3 09/18/2022 1721   CALCIUM 9.1 05/25/2014 1059   PROT 7.1 09/18/2022 1721   PROT 6.3 (L) 05/06/2012 1924   ALBUMIN 4.2 09/18/2022 1721   ALBUMIN 3.5 05/06/2012 1924   AST 44 (H) 09/18/2022 1721   AST 41 (H) 05/06/2012 1924   ALT 22 09/18/2022 1721   ALT 39 05/06/2012 1924   ALKPHOS 66 09/18/2022 1721   ALKPHOS 66 05/06/2012 1924   BILITOT 0.8 09/18/2022 1721   BILITOT 0.2 05/06/2012 1924   GFRNONAA 47 (L) 09/18/2022 1721   GFRNONAA 49 (L) 12/09/2014 0951   GFRNONAA 39 (L) 07/29/2014 0917   GFRAA 45 (L) 08/15/2017 1013   GFRAA 59 (L) 12/09/2014 0951   GFRAA 46 (L) 07/29/2014 0917     VAS [place] ABI WITH/WO TBI  Result Date: 11/15/2022  LOWER EXTREMITY DOPPLER STUDY Patient Name:  RAVONDA BRECHEEN  Date of Exam:   11/15/2022 Medical Rec #: [id number]   Accession #:    [id number] Date of Birth: Aug 05, 1941   Patient Gender: F Patient Age:   63 years  Exam Location:  [facility] Procedure:      VAS [place] ABI WITH/WO TBI Referring Phys: Leotis Pain --------------------------------------------------------------------------------  Indications: Peripheral artery disease, and Bilat neuropathy.  Comparison Study: 10/19/2021 Performing Technologist: Almira Coaster RVS  Examination Guidelines: A complete evaluation includes at minimum, Doppler waveform signals and systolic blood pressure reading at the level of bilateral brachial, anterior tibial, and posterior tibial arteries, when vessel segments are accessible. Bilateral testing is considered an integral part of a complete examination. Photoelectric Plethysmograph (PPG) waveforms and toe systolic pressure readings are included as required and additional duplex testing as needed. Limited examinations for reoccurring indications may be performed as noted.  ABI Findings: +---------+------------------+-----+---------+--------+ Right    Rt Pressure (mmHg)IndexWaveform Comment  +---------+------------------+-----+---------+--------+ Brachial 136                                      +---------+------------------+-----+---------+--------+ ATA      145               1.07 triphasic         +---------+------------------+-----+---------+--------+ PTA      140               1.03 triphasic         +---------+------------------+-----+---------+--------+ Great Toe130               0.96 Normal            +---------+------------------+-----+---------+--------+ +---------+------------------+-----+---------+-------+ Left     Lt Pressure (mmHg)IndexWaveform Comment +---------+------------------+-----+---------+-------+ Brachial 134                                     +---------+------------------+-----+---------+-------+ ATA      144               1.06 triphasic        +---------+------------------+-----+---------+-------+ PTA      143               1.05 triphasic         +---------+------------------+-----+---------+-------+ Great Toe145               1.07 Normal           +---------+------------------+-----+---------+-------+ +-------+-----------+-----------+------------+------------+ ABI/TBIToday's ABIToday's TBIPrevious ABIPrevious TBI +-------+-----------+-----------+------------+------------+  Right  1.07       .96        1.12        .91          +-------+-----------+-----------+------------+------------+ Left   1.06       1.07       1.07        .92          +-------+-----------+-----------+------------+------------+ Bilateral ABIs appear essentially unchanged compared to prior study on 10/19/2021. Bilateral TBIs appear increased compared to prior study on 10/19/2021.  Summary: Right: Resting right ankle-brachial index is within normal range. The right toe-brachial index is normal. Left: Resting left ankle-brachial index is within normal range. The left toe-brachial index is normal. *See table(s) above for measurements and observations.   Electronically signed by Leotis Pain MD on 11/15/2022 at 3:27:00 PM.    Final        Assessment & Plan:   1. Abdominal aortic aneurysm (AAA) without rupture, unspecified part ([place]) Recommend: No surgery or intervention is indicated at this time.  The patient has an asymptomatic abdominal aortic aneurysm that is less than 4 cm in maximal diameter.    I have reviewed the natural history of abdominal aortic aneurysm and the small risk of rupture for aneurysm less than 5 cm in size.  However, as these small aneurysms tend to enlarge over time, continued surveillance with ultrasound or CT scan is mandatory.   I have also discussed optimizing medical management with hypertension and lipid control and the importance of abstinence from tobacco.  The patient is also encouraged to exercise a minimum of 30 minutes 4 times a week.   Should the patient develop new onset abdominal or back pain or signs of peripheral  embolization they are instructed to seek medical attention immediately and to alert the physician providing care that they have an aneurysm.   The patient voices their understanding.  The patient will return in 12 months with an aortic duplex.  2. Bilateral carotid artery stenosis Patient has a previous left carotid endarterectomy.  The site remains open and patent with no evidence of significant restenosis.  The right is a 40 to 59%.  Based on this we will recheck in 1 year.  3. PAD (peripheral artery disease) (HCC) ABIs remain in the normal range.  Good waveforms and digital pressures bilaterally.  No role for intervention.  Recheck 1 year.  4. Mixed hyperlipidemia Continue statin as ordered and reviewed, no changes at this time   Current Outpatient Medications on File Prior to Visit  Medication Sig Dispense Refill   amLODipine (NORVASC) 5 MG tablet Take 5 mg by mouth daily.     aspirin 81 MG tablet Take 81 mg by mouth daily.     cholecalciferol (VITAMIN D) 1000 UNITS tablet Take 1,000 Units by mouth daily.     isosorbide mononitrate (IMDUR) 60 MG 24 hr tablet Take 1 tablet (60 mg total) by mouth daily. 90 tablet 3   metoprolol succinate (TOPROL-XL) 50 MG 24 hr tablet Take 1 tablet (50 mg total) by mouth daily. Take with or immediately following a meal. 90 tablet 1   Multiple Vitamins-Minerals (PRESERVISION AREDS 2 PO) Take by mouth 2 (two) times daily.     nitroGLYCERIN (NITROSTAT) 0.4 MG SL tablet Place 1 tablet (0.4 mg total) under the tongue every 5 (five) minutes as needed for chest pain. 50 tablet 4   omeprazole (PRILOSEC) 20 MG capsule TAKE 1 CAPSULE(20 MG) BY MOUTH DAILY  90 capsule 1   rosuvastatin (CRESTOR) 40 MG tablet Take 1 tablet (40 mg total) by mouth daily. 90 tablet 1   tiZANidine (ZANAFLEX) 2 MG tablet TAKE 1 TABLET(2 MG) BY MOUTH AT BEDTIME 30 tablet 0   Turmeric (QC TUMERIC COMPLEX PO) Take 1 mg by mouth daily.     albuterol (VENTOLIN HFA) 108 (90 Base) MCG/ACT  inhaler Inhale 2 puffs into the lungs every 6 (six) hours as needed for wheezing or shortness of breath. 8 g 0   benzonatate (TESSALON PERLES) 100 MG capsule Take 1 capsule (100 mg total) by mouth 3 (three) times daily as needed for cough. (Patient not taking: Reported on 11/15/2022) 30 capsule 0   No current facility-administered medications on file prior to visit.    There are no Patient Instructions on file for this visit. No follow-ups on file.   Kris Hartmann, NP

## 2022-12-02 ENCOUNTER — Other Ambulatory Visit: Payer: Self-pay | Admitting: Internal Medicine

## 2022-12-13 ENCOUNTER — Other Ambulatory Visit: Payer: Self-pay

## 2022-12-13 DIAGNOSIS — I25810 Atherosclerosis of coronary artery bypass graft(s) without angina pectoris: Secondary | ICD-10-CM

## 2022-12-13 MED ORDER — ROSUVASTATIN CALCIUM 40 MG PO TABS: 40.0000 mg | ORAL_TABLET | Freq: Every day | ORAL | 3 refills | Status: DC

## 2022-12-14 ENCOUNTER — Other Ambulatory Visit: Payer: Self-pay | Admitting: Internal Medicine

## 2023-01-08 NOTE — Progress Notes (Unsigned)
Cardiology Office Note  Date:  01/09/2023   ID:  Sabrina Montoya, DOB [birth year], MRN [id number]  PCP:  Sabrina Mc, MD   Chief Complaint  Patient presents with   6 month follow up    Patient c/o feeling tired all the time. Medications reviewed by the patient verbally.     HPI:  Ms. Sabrina Montoya is a 82 year old woman with past medical history of Coronary disease CABG,  LIMA to LAD, SVG to L PDA and OM1 2006 Patent LIMA to LAD, SVG to L PDA, occluded SVG to OM1 06/07/2021 COPD / quit tobacco abuse 2013 PAD, Status post carotid endarterectomy Chronic kidney disease Who presents for follow-up of her coronary disease and continued chest pain  Last seen in clinic March 2023 In follow-up today reports feeling very tired on a regular basis Restless sleep Exercise limited by back pain, prior back surgery 1970, 1980s/fusion Tries to stay active, maintains her house  Periodic chest discomfort but has not required much nitroglycerin Feels she has done better on isosorbide 60 mg daily On prior office visit we had recommended doing 30 in the morning and 60 in the evening, reports she is only doing 60 daily  Troubled with pricing of medications given in the ER November 2023, inhaler, Z-Pak, steroids  Cholesterol at goal  Carotid ultrasound less than 39% disease bilaterally  EKG personally reviewed by myself on todays visit Normal sinus rhythm rate 63 bpm no significant ST-T wave changes  Other past medical history reviewed Previously followed by Sabrina Montoya, last seen December 2022  Prior cardiac history reviewed with her in detail hospitalized for non-ST elevation myocardial infarction August 2022  cardiac catheterization 06/07/2021 which revealed severe three-vessel coronary artery disease with occluded ostial LAD, occluded left circumflex, occluded nondominant RCA, with patent LIMA to distal LAD, patent SVG to left PDA with retrograde flow to proximal left circumflex and OM1, and occluded SVG  to OM1. Patient treated medically.   LVEF greater than 55%.   07/01/2021 when she developed elevated blood pressures at home, 191/82, associated with heart pounding   7-day Holter monitor (07/28/2021 through 08/04/2021) revealed predominant sinus bradycardia with mean heart rate of 57 bpm, heart rate range 46 to 127 bpm, infrequent premature atrial contractions and premature ventricular contractions, and rare atrial runs the longest lasting 23 beats.  LDL cholesterol was 48 on 06/06/2021. A1c trending higher, greater than 7.  Try to treat this with diet alone  PMH:   has a past medical history of Abdominal aortic aneurysm without mention of rupture, Acute posthemorrhagic anemia, Arthritis, B12 deficiency, CAD (coronary artery disease), autologous vein bypass graft, Cardiac dysrhythmia, unspecified, Colon polyp, Dental infection (01/17/2022), Depression, Diverticulitis of colon, Dizziness, Dysrhythmia, GERD (gastroesophageal reflux disease), HOH (hard of hearing), Hyperlipidemia, Hypertension, IBS (irritable bowel syndrome), Neuropathy, Neuropathy (07/01/2013), Peripheral vascular disease ([place]), Reflux esophagitis, Rheumatic fever, Sliding hiatal hernia, Tobacco abuse, and Tobacco abuse.  PSH:    Past Surgical History:  Procedure Laterality Date   ABDOMINAL HYSTERECTOMY     ABDOMINAL SURGERY  1976   for pain secondary to scar tissue, s/p apply   APPENDECTOMY  1974   BACK SURGERY     CAROTID ENDARTERECTOMY     CATARACT EXTRACTION W/PHACO Right 10/18/2016   Procedure: CATARACT EXTRACTION PHACO AND INTRAOCULAR LENS PLACEMENT ([place]);  Surgeon: Sabrina Cotta, MD;  Location: [facility];  Service: Ophthalmology;  Laterality: Right;  Lot # [id number] H [place]: 01:04.0 AP%: 24.4 CDE: 30.44   CATARACT EXTRACTION W/PHACO Left 03/14/2017  Procedure: CATARACT EXTRACTION PHACO AND INTRAOCULAR LENS PLACEMENT ([place])  Left;  Surgeon: Sabrina Koyanagi, MD;  Location: [facility];  Service: Ophthalmology;   Laterality: Left;   CEA     Carotid stenosis found during workup for dysphagia,  [place]  2009   Dr. Hoover Montoya for cervical cord stenosis, C5-6 diskectomy   CHOLECYSTECTOMY  2002   Dr. Pat Montoya   CLOSED MANIPULATION SHOULDER     left shoulder post physical therapy, Right shoulder redo (toggle bolts)   COLONOSCOPY  09/01/14   coronary angiography  01/2011   one occluded artery with collateralization, other arteries patent   CORONARY ARTERY BYPASS GRAFT  2006   s/p triple bypass surgery, [facility]   heart catherization  2016   HERNIA REPAIR  may 2011   Dr. Pat Montoya   hernia repair     LEFT HEART CATH AND CORONARY ANGIOGRAPHY N/A 06/07/2021   Procedure: LEFT HEART CATH AND CORONARY ANGIOGRAPHY;  Surgeon: Sabrina Cowman, MD;  Location: [facility];  Service: Cardiovascular;  Laterality: N/A;   LUMBAR DISC SURGERY  1971   L5, unssuccessful, fusion in 1983 successful (2 lumbar)   PARTIAL HYSTERECTOMY     ROTATOR CUFF REPAIR  2002   right shoulder Dr. Nadean Montoya, [facility] in [place]      Current Outpatient Medications  Medication Sig Dispense Refill   albuterol (VENTOLIN HFA) 108 (90 Base) MCG/ACT inhaler Inhale 2 puffs into the lungs every 6 (six) hours as needed for wheezing or shortness of breath. 8 g 0   amLODipine (NORVASC) 5 MG tablet Take 5 mg by mouth daily.     aspirin 81 MG tablet Take 81 mg by mouth daily.     cholecalciferol (VITAMIN D) 1000 UNITS tablet Take 1,000 Units by mouth daily.     isosorbide mononitrate (IMDUR) 60 MG 24 hr tablet Take 1 tablet (60 mg total) by mouth daily. 90 tablet 3   metoprolol succinate (TOPROL-XL) 50 MG 24 hr tablet Take 1 tablet (50 mg total) by mouth daily. Take with or immediately following a meal. 90 tablet 1   Multiple Vitamins-Minerals (PRESERVISION AREDS 2 PO) Take by mouth 2 (two) times daily.     nitroGLYCERIN (NITROSTAT) 0.4 MG SL tablet Place 1 tablet (0.4 mg total) under  the tongue every 5 (five) minutes as needed for chest pain. 50 tablet 4   omeprazole (PRILOSEC) 20 MG capsule TAKE 1 CAPSULE(20 MG) BY MOUTH DAILY 90 capsule 1   rosuvastatin (CRESTOR) 40 MG tablet Take 1 tablet (40 mg total) by mouth daily. 90 tablet 3   tiZANidine (ZANAFLEX) 2 MG tablet TAKE 1 TABLET(2 MG) BY MOUTH AT BEDTIME 30 tablet 0   Turmeric (QC TUMERIC COMPLEX PO) Take 1 mg by mouth daily.     benzonatate (TESSALON PERLES) 100 MG capsule Take 1 capsule (100 mg total) by mouth 3 (three) times daily as needed for cough. (Patient not taking: Reported on 11/15/2022) 30 capsule 0   No current facility-administered medications for this visit.    Allergies:   Telmisartan, Niacin, Niacin and related, Sertraline hcl, Sulfa antibiotics, Sulfa drugs cross reactors, Tetracycline, Tetracyclines & related, Tizanidine, Latex, and Simvastatin   Social History:  The patient  reports that she quit smoking about 10 years ago. Her smoking use included cigarettes. She has a 15.00 pack-year smoking history. She has never used smokeless tobacco. She reports that she does not drink alcohol and does  not use drugs.   Family History:   family history includes Breast cancer in her sister; Cancer in her sister and sister; Diabetes in her mother, sister, and sister; Heart disease in her brother and mother; Other in her brother, father, and another family member.    Review of Systems: Review of Systems  Constitutional: Negative.   HENT: Negative.    Respiratory: Negative.    Cardiovascular:  Positive for chest pain.  Gastrointestinal: Negative.   Musculoskeletal:  Positive for back pain.  Neurological: Negative.   Psychiatric/Behavioral: Negative.    All other systems reviewed and are negative.   PHYSICAL EXAM: VS:  BP (!) 120/52 (BP Location: Left Arm, Patient Position: Sitting, Cuff Size: Normal)   Pulse 63   Ht '5\' 4"'$  (1.626 m)   Wt 154 lb 8 oz (70.1 kg)   SpO2 97%   BMI 26.52 kg/m  , BMI Body mass  index is 26.52 kg/m. Constitutional:  oriented to person, place, and time. No distress.  HENT:  Head: Grossly normal Eyes:  no discharge. No scleral icterus.  Neck: No JVD, no carotid bruits  Cardiovascular: Regular rate and rhythm, no murmurs appreciated Pulmonary/Chest: Clear to auscultation bilaterally, no wheezes or rails Abdominal: Soft.  no distension.  no tenderness.  Musculoskeletal: Normal range of motion Neurological:  normal muscle tone. Coordination normal. No atrophy Skin: Skin warm and dry Psychiatric: normal affect, pleasant  Recent Labs: 09/18/2022: ALT 22; BUN 29; Creatinine, Ser 1.17; Hemoglobin 11.0; Platelets 164; Potassium 4.1; Sodium 139    Lipid Panel Lab Results  Component Value Date   CHOL 105 07/17/2022   HDL 36.10 (L) 07/17/2022   LDLCALC 43 07/17/2022   TRIG 130.0 07/17/2022    Wt Readings from Last 3 Encounters:  01/09/23 154 lb 8 oz (70.1 kg)  11/15/22 148 lb (67.1 kg)  10/04/22 148 lb 12.8 oz (67.5 kg)     ASSESSMENT AND PLAN:  Problem List Items Addressed This Visit       Cardiology Problems   AAA (abdominal aortic aneurysm) without rupture (HCC)   Angina pectoris ([place]) - Primary   Hyperlipidemia   Bilateral carotid artery stenosis     Other   Controlled type 2 diabetes with neuropathy ([place])   Pulmonary emphysema (HCC)  Coronary artery disease with stable angina History of CABG, catheterization August 2022 Reports symptoms are stable on isosorbide 60 mg daily, did not take the extra 30 in the morning  Case previously discussed with Dr. Fletcher Anon:  "ttight mid left circumflex stenosis feeding an OM branch that currently does not have a patent graft.  The lesion is heavily calcified and the vessel distally is tortuous and thus its not straightforward for PCI and will require atherectomy.  recommend maximizing antianginal therapy before considering high risk PCI. " Currently active without unstable anginal  symptoms.  Hyperlipidemia Cholesterol at goal, continue current medication regimen  Type 2 diabetes with complications 123456 running 7.1, stable Working with Dr. Derrel Nip  PAD History of carotid endarterectomy Ultrasound January 2024 followed by Dr. Lucky Cowboy, 83 to 59% disease on the right    Total encounter time more than 30 minutes  Greater than 50% was spent in counseling and coordination of care with the patient    Signed, Esmond Plants, M.D., Ph.D. [place], [place]

## 2023-01-09 ENCOUNTER — Encounter: Payer: Self-pay | Admitting: Cardiovascular Disease

## 2023-01-09 ENCOUNTER — Ambulatory Visit: Payer: Medicare Other | Attending: Cardiovascular Disease | Admitting: Cardiovascular Disease

## 2023-01-09 VITALS — BP 120/52 | HR 63 | Ht 64.0 in | Wt 154.5 lb

## 2023-01-09 DIAGNOSIS — J432 Centrilobular emphysema: Secondary | ICD-10-CM | POA: Diagnosis not present

## 2023-01-09 DIAGNOSIS — T733XXA Exhaustion due to excessive exertion, initial encounter: Secondary | ICD-10-CM | POA: Diagnosis not present

## 2023-01-09 DIAGNOSIS — E1140 Type 2 diabetes mellitus with diabetic neuropathy, unspecified: Secondary | ICD-10-CM

## 2023-01-09 DIAGNOSIS — R001 Bradycardia, unspecified: Secondary | ICD-10-CM

## 2023-01-09 DIAGNOSIS — I951 Orthostatic hypotension: Secondary | ICD-10-CM | POA: Diagnosis not present

## 2023-01-09 DIAGNOSIS — E782 Mixed hyperlipidemia: Secondary | ICD-10-CM | POA: Diagnosis not present

## 2023-01-09 DIAGNOSIS — I7143 Infrarenal abdominal aortic aneurysm, without rupture: Secondary | ICD-10-CM | POA: Diagnosis not present

## 2023-01-09 DIAGNOSIS — I6523 Occlusion and stenosis of bilateral carotid arteries: Secondary | ICD-10-CM | POA: Diagnosis not present

## 2023-01-09 DIAGNOSIS — I209 Angina pectoris, unspecified: Secondary | ICD-10-CM | POA: Diagnosis not present

## 2023-01-09 MED ORDER — AMLODIPINE BESYLATE 5 MG PO TABS: 5.0000 mg | ORAL_TABLET | Freq: Every day | ORAL | 3 refills | Status: DC

## 2023-01-09 MED ORDER — METOPROLOL SUCCINATE ER 50 MG PO TB24: 50.0000 mg | ORAL_TABLET | Freq: Every day | ORAL | 3 refills | Status: DC

## 2023-01-09 MED ORDER — ISOSORBIDE MONONITRATE ER 60 MG PO TB24: 60.0000 mg | ORAL_TABLET | Freq: Every day | ORAL | 3 refills | Status: DC

## 2023-01-09 NOTE — Patient Instructions (Signed)
Medication Instructions:  Please make sure metoprolol taken in the evening with dinner  If you need a refill on your cardiac medications before your next appointment, please call your pharmacy.   Lab work: No new labs needed  Testing/Procedures: No new testing needed  Follow-Up: At [facility], you and your health needs are our priority.  As part of our continuing mission to provide you with exceptional heart care, we have created designated Provider Care Teams.  These Care Teams include your primary Cardiologist (physician) and Advanced Practice Providers (APPs -  Physician Assistants and Nurse Practitioners) who all work together to provide you with the care you need, when you need it.  You will need a follow up appointment in 12 months  Providers on your designated Care Team:   Murray Hodgkins, NP Christell Faith, PA-C Cadence Kathlen Mody, [place]  COVID-19 Vaccine Information can be found at: [email or web address] For questions related to vaccine distribution or appointments, please email [email or web address] or call [phone].

## 2023-01-23 ENCOUNTER — Ambulatory Visit (INDEPENDENT_AMBULATORY_CARE_PROVIDER_SITE_OTHER): Payer: Medicare Other | Admitting: Internal Medicine

## 2023-01-23 ENCOUNTER — Encounter: Payer: Self-pay | Admitting: Internal Medicine

## 2023-01-23 ENCOUNTER — Other Ambulatory Visit (HOSPITAL_COMMUNITY): Payer: Self-pay

## 2023-01-23 ENCOUNTER — Telehealth: Payer: Self-pay

## 2023-01-23 VITALS — BP 128/68 | HR 64 | Temp 98.6°F | Resp 12 | Ht 64.0 in | Wt 152.6 lb

## 2023-01-23 DIAGNOSIS — E1140 Type 2 diabetes mellitus with diabetic neuropathy, unspecified: Secondary | ICD-10-CM

## 2023-01-23 DIAGNOSIS — Z5309 Procedure and treatment not carried out because of other contraindication: Secondary | ICD-10-CM

## 2023-01-23 DIAGNOSIS — I209 Angina pectoris, unspecified: Secondary | ICD-10-CM

## 2023-01-23 DIAGNOSIS — I7143 Infrarenal abdominal aortic aneurysm, without rupture: Secondary | ICD-10-CM

## 2023-01-23 DIAGNOSIS — I6523 Occlusion and stenosis of bilateral carotid arteries: Secondary | ICD-10-CM | POA: Diagnosis not present

## 2023-01-23 DIAGNOSIS — N281 Cyst of kidney, acquired: Secondary | ICD-10-CM | POA: Diagnosis not present

## 2023-01-23 LAB — TSH: TSH: 1.74 u[IU]/mL (ref 0.35–5.50)

## 2023-01-23 LAB — COMPREHENSIVE METABOLIC PANEL
ALT: 20 U/L (ref 0–35)
AST: 40 U/L — ABNORMAL HIGH (ref 0–37)
Albumin: 4.5 g/dL (ref 3.5–5.2)
Alkaline Phosphatase: 76 U/L (ref 39–117)
BUN: 27 mg/dL — ABNORMAL HIGH (ref 6–23)
CO2: 26 mEq/L (ref 19–32)
Calcium: 9.8 mg/dL (ref 8.4–10.5)
Chloride: 105 mEq/L (ref 96–112)
Creatinine, Ser: 1.37 mg/dL — ABNORMAL HIGH (ref 0.40–1.20)
GFR: 36.43 mL/min — ABNORMAL LOW (ref >60.00)
Glucose, Bld: 122 mg/dL — ABNORMAL HIGH (ref 70–99)
Potassium: 4.3 mEq/L (ref 3.5–5.1)
Sodium: 141 mEq/L (ref 135–145)
Total Bilirubin: 0.5 mg/dL (ref 0.2–1.2)
Total Protein: 7 g/dL (ref 6.0–8.3)

## 2023-01-23 LAB — LIPID PANEL
Cholesterol: 116 mg/dL (ref 0–200)
HDL: 42.3 mg/dL (ref >39.00)
LDL Cholesterol: 35 mg/dL (ref 0–99)
NonHDL: 73.7
Total CHOL/HDL Ratio: 3
Triglycerides: 193 mg/dL — ABNORMAL HIGH (ref 0.0–149.0)
VLDL: 38.6 mg/dL (ref 0.0–40.0)

## 2023-01-23 LAB — POCT GLYCOSYLATED HEMOGLOBIN (HGB A1C): Hemoglobin A1C: 6.8 % — AB (ref 4.0–5.6)

## 2023-01-23 LAB — LDL CHOLESTEROL, DIRECT: Direct LDL: 48 mg/dL

## 2023-01-23 MED ORDER — ISOSORBIDE MONONITRATE ER 60 MG PO TB24: 60.0000 mg | ORAL_TABLET | Freq: Every day | ORAL | 3 refills | Status: DC

## 2023-01-23 MED ORDER — ISOSORBIDE MONONITRATE ER 30 MG PO TB24: 30.0000 mg | ORAL_TABLET | Freq: Two times a day (BID) | ORAL | 1 refills | Status: DC

## 2023-01-23 MED ORDER — TIRZEPATIDE 2.5 MG/0.5ML ~~LOC~~ SOAJ: 2.5000 mg | SUBCUTANEOUS | 2 refills | Status: DC

## 2023-01-23 NOTE — Telephone Encounter (Signed)
PA for Darcel Bayley is needed.

## 2023-01-23 NOTE — Patient Instructions (Addendum)
  You are due for your tetanus-diptheria-pertussis vaccine   (TDaP)   You can get it for free at your pharmacy  Change your imdur dosing to:  30 mg in the morning  60 mg at dinner.  30 mg at bedtime   Sabrina Montoya has been sent to [facility]  Prior authorization is needed   I have also printed you a copy because it may be cheaper at another pharmacy

## 2023-01-23 NOTE — Progress Notes (Unsigned)
Subjective:  Patient ID: Sabrina Montoya, female    DOB: September 03, 1941  Age: 82 y.o. MRN: [id number]  CC: The primary encounter diagnosis was Controlled type 2 diabetes with neuropathy ([place]). Diagnoses of Infrarenal abdominal aortic aneurysm (AAA) without rupture ([place]), Acquired renal cyst of right kidney, Angina pectoris ([place]), Bilateral carotid artery stenosis, and ACEI/ARB contraindicated were also pertinent to this visit.   HPI Sabrina Montoya presents for follow up on multiple issues.  She was last seen Nov 29  Chief Complaint  Patient presents with   Medical Management of Chronic Issues    1) Type 2 DM:   diet controlled.  she reports that she has had increased appetite for the past 3-4 weeks,  craving bacon and tomato sandwiches.  Labs reviewed:  her A1c has improved to 6.8 without medication, but has had a weight gain  of 7 lbs since September . Doesn't feel like cooking , eating mostly prepared foods. Wants to start mounjaro   2) CAD:  saw [facility] March 5.  She is  anxious and frustrated because she continues to have exertional chest pain despite increasing use of imdur to 30 mg in the am and 60 mg in the evening.  The episodes of angina occur late at night when she transitions from recliner to bed , but they do not occur during the day during household or outdoor  activities.  The  Pain is relieved with NTG. X 1 most of the time.  I Reviewed last cardiac cath in August 2022 by  Miquel Dunn: during admission for NSTEMI , at which time medical management was recommended due to extensive atherosclerosis  1.  Non-ST elevation myocardial infarction 2.  Severe three-vessel coronary artery disease with chronically occluded ostial LAD, chronically occluded left circumflex, moderate to heavily calcified 95% stenosis proximal left circumflex, chronically occluded nondominant mid RCA 3.  Patent LIMA to distal LAD, patent SVG to left PDA with retrograde flow to proximal/mid left circumflex and OM1,  chronically occluded SVG to OM1 4.  Preserved left ventricular function, with LVEF greater than 55% .  3) COPD:  she notes recurrent chest tightness with exposure to cold weather.  She quit smoking in 2013,  after her CABG/    Outpatient Medications Prior to Visit  Medication Sig Dispense Refill   albuterol (VENTOLIN HFA) 108 (90 Base) MCG/ACT inhaler Inhale 2 puffs into the lungs every 6 (six) hours as needed for wheezing or shortness of breath. 8 g 0   aspirin 81 MG tablet Take 81 mg by mouth daily.     benzonatate (TESSALON PERLES) 100 MG capsule Take 1 capsule (100 mg total) by mouth 3 (three) times daily as needed for cough. (Patient not taking: Reported on 11/15/2022) 30 capsule 0   cholecalciferol (VITAMIN D) 1000 UNITS tablet Take 1,000 Units by mouth daily.     metoprolol succinate (TOPROL-XL) 50 MG 24 hr tablet Take 1 tablet (50 mg total) by mouth daily. Take with or immediately following a meal. 90 tablet 3   Multiple Vitamins-Minerals (PRESERVISION AREDS 2 PO) Take by mouth 2 (two) times daily.     nitroGLYCERIN (NITROSTAT) 0.4 MG SL tablet Place 1 tablet (0.4 mg total) under the tongue every 5 (five) minutes as needed for chest pain. 50 tablet 4   omeprazole (PRILOSEC) 20 MG capsule TAKE 1 CAPSULE(20 MG) BY MOUTH DAILY 90 capsule 1   rosuvastatin (CRESTOR) 40 MG tablet Take 1 tablet (40 mg total) by mouth daily. [place]  tablet 3   tiZANidine (ZANAFLEX) 2 MG tablet TAKE 1 TABLET(2 MG) BY MOUTH AT BEDTIME 30 tablet 0   Turmeric (QC TUMERIC COMPLEX PO) Take 1 mg by mouth daily.     amLODipine (NORVASC) 5 MG tablet Take 1 tablet (5 mg total) by mouth daily. 90 tablet 3   isosorbide mononitrate (IMDUR) 60 MG 24 hr tablet Take 1 tablet (60 mg total) by mouth daily. 90 tablet 3   No facility-administered medications prior to visit.    Review of Systems;  Patient denies headache, fevers, malaise, unintentional weight loss, skin rash, eye pain, sinus congestion and sinus pain, sore throat,  dysphagia,  hemoptysis , cough, wheezing,  palpitations, orthopnea, edema, abdominal pain, nausea, melena, diarrhea, constipation, flank pain, dysuria, hematuria, urinary  Frequency, nocturia, numbness, tingling, seizures,  Focal weakness, Loss of consciousness,  Tremor, insomnia, depression,  and suicidal ideation.      Objective:  BP 128/68   Pulse 64   Temp 98.6 F (37 C)   Resp 12   Ht 5\' 4"  (1.626 m)   Wt 152 lb 9.6 oz (69.2 kg)   SpO2 98%   BMI 26.19 kg/m   BP Readings from Last 3 Encounters:  01/25/23 128/68  01/09/23 (!) 120/52  11/15/22 123/67    Wt Readings from Last 3 Encounters:  01/25/23 152 lb 9.6 oz (69.2 kg)  01/09/23 154 lb 8 oz (70.1 kg)  11/15/22 148 lb (67.1 kg)    Physical Exam  Lab Results  Component Value Date   HGBA1C 6.8 (A) 01/23/2023   HGBA1C 7.1 (H) 07/17/2022   HGBA1C 7.0 (H) 12/13/2021    Lab Results  Component Value Date   CREATININE 1.37 (H) 01/23/2023   CREATININE 1.17 (H) 09/18/2022   CREATININE 1.27 (H) 08/18/2022    Lab Results  Component Value Date   WBC 4.7 09/18/2022   HGB 11.0 (L) 09/18/2022   HCT 33.4 (L) 09/18/2022   PLT 164 09/18/2022   GLUCOSE 122 (H) 01/23/2023   CHOL 116 01/23/2023   TRIG 193.0 (H) 01/23/2023   HDL 42.30 01/23/2023   LDLDIRECT 48.0 01/23/2023   LDLCALC 35 01/23/2023   ALT 20 01/23/2023   AST 40 (H) 01/23/2023   NA 141 01/23/2023   K 4.3 01/23/2023   CL 105 01/23/2023   CREATININE 1.37 (H) 01/23/2023   BUN 27 (H) 01/23/2023   CO2 26 01/23/2023   TSH 1.74 01/23/2023   INR 1.1 06/06/2021   HGBA1C 6.8 (A) 01/23/2023   MICROALBUR 4.8 (H) 07/17/2022    CT ABDOMEN PELVIS W CONTRAST  Result Date: 10/05/2022 CLINICAL DATA:  New onset right lower quadrant abdominal pain with change in stool pattern. Prior appendectomy and partial hysterectomy. EXAM: CT ABDOMEN AND PELVIS WITH CONTRAST TECHNIQUE: Multidetector CT imaging of the abdomen and pelvis was performed using the standard protocol  following bolus administration of intravenous contrast. RADIATION DOSE REDUCTION: This exam was performed according to the departmental dose-optimization program which includes automated exposure control, adjustment of the mA and/or kV according to patient size and/or use of iterative reconstruction technique. CONTRAST:  153mL OMNIPAQUE IOHEXOL 300 MG/ML  SOLN COMPARISON:  08/15/2017 CT abdomen/pelvis. FINDINGS: Lower chest: No significant pulmonary nodules or acute consolidative airspace disease. Hepatobiliary: Normal liver size. No liver mass. Cholecystectomy. No biliary ductal dilatation. Pancreas: Normal, with no mass or duct dilation. Spleen: Normal size. No mass. Adrenals/Urinary Tract: Normal adrenals. No hydronephrosis. Complex 3.7 cm interpolar right renal cyst with mixed attenuation (series 2/image 34),  mildly increased from 2.9 cm on 08/02/2017 CT. Simple 2.5 cm medial lower left renal cyst and additional subcentimeter hypodense bilateral renal cortical lesions that are too small to characterize, for which no follow-up imaging is recommended. Small cystocele, otherwise normal bladder. Stomach/Bowel: Normal non-distended stomach. Normal caliber small bowel with no small bowel wall thickening. Appendix not discretely visualized, compatible with reported history of prior appendectomy. No pericecal inflammatory changes. Marked left colonic diverticulosis, with no acute large bowel wall thickening or acute pericolonic fat stranding. Vascular/Lymphatic: Atherosclerotic abdominal aorta with 3.7 cm infrarenal abdominal aortic aneurysm. Patent portal, splenic, hepatic and renal veins. No pathologically enlarged lymph nodes in the abdomen or pelvis. Reproductive: Status post hysterectomy, with no abnormal findings at the vaginal cuff. No adnexal mass. Other: No pneumoperitoneum, ascites or focal fluid collection. Musculoskeletal: No aggressive appearing focal osseous lesions. Marked thoracolumbar degenerative disc  disease. Chronic postsurgical changes in the posterior elements of the lower lumbar spine with associated cerclage wires. IMPRESSION: 1. No acute abnormality. No evidence of bowel obstruction or acute bowel inflammation. Marked left colonic diverticulosis, with no evidence of acute diverticulitis. 2. Complex 3.7 cm interpolar right renal cyst with mixed attenuation, mildly increased from 2.9 cm on 08/02/2017 CT. Recommend further characterization with outpatient MRI (preferred) or CT abdomen without and with IV contrast. 3. Small cystocele. 4. Infrarenal 3.7 cm abdominal aortic aneurysm. Recommend follow-up ultrasound every 2 years. This recommendation follows ACR consensus guidelines: White Paper of the ACR Incidental Findings Committee II on Vascular Findings. J Am Coll Radiol 2013CJ:3944253. 5.  Aortic Atherosclerosis (ICD10-I70.0). Electronically Signed   By: Ilona Sorrel M.D.   On: 10/05/2022 12:33    Assessment & Plan:  .Controlled type 2 diabetes with neuropathy ([place]) Assessment & Plan: A1c is s< 7.0   on diet alone   and she nas new onset microalbuminuria. She has declined trial of SGLT 2inhibitor  in the past and is currently requesting GLP 1 agonist for management of weight gain . She has no contraindications to GLP 1 agonist therapy.  She has a history of acute renal failure during trial of telmisartan . Paient is up-to-date on eye exams . continue asa and crestor.  Lab Results  Component Value Date   HGBA1C 6.8 (A) 01/23/2023   Lab Results  Component Value Date   MICROALBUR 4.8 (H) 07/17/2022   Lab Results  Component Value Date   CREATININE 1.37 (H) 01/23/2023     Orders: -     POCT glycosylated hemoglobin (Hb A1C) -     Comprehensive metabolic panel -     TSH -     LDL cholesterol, direct -     Lipid panel -     Basic metabolic panel; Future  Infrarenal abdominal aortic aneurysm (AAA) without rupture [facility]) Assessment & Plan:   She was referred for a repeat  CT scan with  iV contrast  in November which noted no change to the  "infrarenal 3.7 cm abdominal aortic aneurysm. Recommend follow-up ultrasound every 2 years.   Acquired renal cyst of right kidney Assessment & Plan: Recommend referral to Urology for further evaluation   Orders: -     Ambulatory referral to Urology  Angina pectoris [facility]) Assessment & Plan: Occurring at night with transition from recliner to bed.  She did not tolerate Ranexa trial in 2023 due to reports of excessive fatigue (which she continues to report despite stopping medication)  Reviewed Dr Donivan Scull plan with patient   Her pain temporarily  tesolved with splitting of Imdur dose to dinnertime and bedtime per Dr Rockey Situ, but has returned  will add a 3rd dose of Imdur as follows:  30 mg in am.  60 mg at dinner  and 30 mg at bedtime.  She would consider repeat trial of ranexa if this regimen fails to resolve her angina/    Bilateral carotid artery stenosis Assessment & Plan: Continue serial monitoring ,  no indication for surgery by Jan 2024 dopplers.  Continue asa and Crestor    ACEI/ARB contraindicated  Other orders -     Isosorbide Mononitrate ER; Take 1 tablet (60 mg total) by mouth daily.  Dispense: 90 tablet; Refill: 3 -     Isosorbide Mononitrate ER; Take 1 tablet (30 mg total) by mouth 2 (two) times daily at 8 am and 10 pm.  Dispense: 180 tablet; Refill: 1 -     Tirzepatide; Inject 2.5 mg into the skin once a week.  Dispense: 2 mL; Refill: 2 -     amLODIPine Besylate; Take 1 tablet (5 mg total) by mouth daily.  Dispense: 90 tablet; Refill: 3     I provided  43 minutes of face-to-face time during this encounter reviewing patient's last visit with me, patient's  most recent visit with cardiology,   last cardiac catheterization,  last CT scan in November , counseling on management of angina CAD, diabetes and renal cyst, and  post visit ordering to diagnostics and therapeutics .   Follow-up: Return in about 3 months (around  04/24/2023) for follow up diabetes.   Crecencio Mc, MD

## 2023-01-24 ENCOUNTER — Other Ambulatory Visit (HOSPITAL_COMMUNITY): Payer: Self-pay

## 2023-01-24 ENCOUNTER — Telehealth: Payer: Self-pay

## 2023-01-24 NOTE — Telephone Encounter (Signed)
PA for [facility] submitted to [facility] and APPROVED on 01/24/23.   [facility] Effective: 01/10/23 - Further notice

## 2023-01-24 NOTE — Telephone Encounter (Signed)
Pharmacy Patient Advocate Encounter   Prior authorization is required for [facility] 2.5MG /0.5ML pen-injectors. PA submitted to [facility] and APPROVED on 01/24/23.  [facility] Effective: 01/10/23 - Further notice

## 2023-01-24 NOTE — Telephone Encounter (Signed)
Pt is aware.  

## 2023-01-25 ENCOUNTER — Encounter: Payer: Self-pay | Admitting: Internal Medicine

## 2023-01-25 DIAGNOSIS — N281 Cyst of kidney, acquired: Secondary | ICD-10-CM | POA: Insufficient documentation

## 2023-01-25 DIAGNOSIS — Z5309 Procedure and treatment not carried out because of other contraindication: Secondary | ICD-10-CM | POA: Insufficient documentation

## 2023-01-25 MED ORDER — AMLODIPINE BESYLATE 5 MG PO TABS: 5.0000 mg | ORAL_TABLET | Freq: Every day | ORAL | 3 refills | Status: DC

## 2023-01-25 NOTE — Assessment & Plan Note (Addendum)
A1c is s< 7.0   on diet alone   and she nas new onset microalbuminuria. She has declined trial of SGLT 2inhibitor  in the past and is currently requesting GLP 1 agonist for management of weight gain . She has no contraindications to GLP 1 agonist therapy.  She has a history of acute renal failure during trial of telmisartan . Paient is up-to-date on eye exams . continue asa and crestor.  Lab Results  Component Value Date   HGBA1C 6.8 (A) 01/23/2023   Lab Results  Component Value Date   MICROALBUR 4.8 (H) 07/17/2022   Lab Results  Component Value Date   CREATININE 1.37 (H) 01/23/2023

## 2023-01-25 NOTE — Assessment & Plan Note (Addendum)
She was referred for a repeat  CT scan with iV contrast  in November which noted no change to the  "infrarenal 3.7 cm abdominal aortic aneurysm. Recommend follow-up ultrasound every 2 years.

## 2023-01-25 NOTE — Assessment & Plan Note (Signed)
Continue serial monitoring ,  no indication for surgery by Jan 2024 dopplers.  Continue asa and Crestor

## 2023-01-25 NOTE — Assessment & Plan Note (Addendum)
Occurring at night with transition from recliner to bed.  She did not tolerate Ranexa trial in 2023 due to reports of excessive fatigue (which she continues to report despite stopping medication)  Reviewed Dr Donivan Scull plan with patient   Her pain temporarily tesolved with splitting of Imdur dose to dinnertime and bedtime per Dr Rockey Situ, but has returned  will add a 3rd dose of Imdur as follows:  30 mg in am.  60 mg at dinner  and 30 mg at bedtime.  She would consider repeat trial of ranexa if this regimen fails to resolve her angina/

## 2023-01-25 NOTE — Assessment & Plan Note (Signed)
Recommend referral to Urology for further evaluation

## 2023-01-26 ENCOUNTER — Telehealth: Payer: Self-pay

## 2023-01-26 NOTE — Telephone Encounter (Signed)
Patient came in office to drop off paperwork for Sabrina Montoya and wanted to know if we have coupons for [facility]. Patient states the cost of this medication is $565 without the coupon.  Please let her know.

## 2023-01-29 ENCOUNTER — Telehealth: Payer: Self-pay | Admitting: Cardiology

## 2023-01-29 ENCOUNTER — Telehealth: Payer: Self-pay | Admitting: Cardiovascular Disease

## 2023-01-29 ENCOUNTER — Telehealth: Payer: Self-pay | Admitting: Internal Medicine

## 2023-01-29 NOTE — Telephone Encounter (Signed)
Pt called with instructions to take imdur 60 at bedtime and 30 in AM and 30 at lunch.  Pt was confused as to why.  Her PCP asked her to do that.  No recent angina.  Will ask Dr. Rockey Situ to address.  On recent OV her BP was on lower end with 60 of imdur daily.   Pt will wait for answer thanks.

## 2023-01-29 NOTE — Telephone Encounter (Signed)
Left message to call back  

## 2023-01-29 NOTE — Telephone Encounter (Signed)
Pt called in staying that she would like to speak to Dr. Derrel Nip assistant concerning some meds, pt also wants to know what's AMDUR means? She's available [phone].

## 2023-01-29 NOTE — Telephone Encounter (Signed)
Pt c/o medication issue:  1. Name of Medication:   isosorbide mononitrate (IMDUR) 60 MG 24 hr tablet  metoprolol succinate (TOPROL-XL) 50 MG 24 hr tablet   2. How are you currently taking this medication (dosage and times per day)?     Patient is concerned she may not be taking medication as prescribed  3. Are you having a reaction (difficulty breathing--STAT)?   No  4. What is your medication issue?   Patient states she needs clarification on the instructions for these medications. Patient is concerned her isosorbide mononitrate has been increased.

## 2023-01-29 NOTE — Telephone Encounter (Signed)
Spoke with pt to let her know that we do not have any coupons that will help her because she has [employer].

## 2023-01-30 NOTE — Telephone Encounter (Addendum)
Spoke with patient and she reviewed needing clarification with her medications. She inquired about both metoprolol and isosorbide mononitrate. Advised that she should be taking her metoprolol daily at bedtime. Reviewed chart for clarification of her isosorbide mononitrate and she has two prescriptions in chart. One for 30 mg twice a day and then another 60 mg once daily. These appear to have been last refilled by her PCP Dr. Darrick Huntsman. Will reach back out to patient to discuss dosages.

## 2023-01-30 NOTE — Telephone Encounter (Signed)
LMTCB

## 2023-01-31 NOTE — Telephone Encounter (Signed)
Spoke with patient and reviewed clarification on her medications. After further chart review instructions for medications were as follows:  Toprol XL 50 mg once daily at dinner Isosorbide mononitrate 30 mg at 08:00 am, 60 mg at lunch, and 30 mg at 10:00 pm  We discussed these instructions in detail and she verbalized understanding with no further questions at this time.

## 2023-01-31 NOTE — Telephone Encounter (Signed)
Verbal discussion with Dr. Rockey Situ in regards to patient instructions. He was agreeable with patient taking isosorbide mononitrate 60 mg once daily with instructions for her to call back if she continues to have any further symptoms. Advised that this was already discussed in detail with patient and she is aware of these instructions. No further needs at this time.

## 2023-01-31 NOTE — Addendum Note (Signed)
Addended by: Valora Corporal on: 01/31/2023 01:05 PM   Modules accepted: Orders

## 2023-01-31 NOTE — Telephone Encounter (Signed)
Called back and spoke with patient with Dr. Donivan Scull recommendations to take isosorbide mononitrate 60 mg once daily and let us know if she should have any further symptoms. She verbalized understanding with no further questions at this time.    Sabrina Merritts, MD  Sent: Wed January 31, 2023 12:14 PM  To: [facility]  Cc: Crecencio Mc, MD    Message  When I saw her recently she was taking isosorbide 60 once a day and was feeling good without much angina  I think if she continues to do well on isosorbide 60 daily, we can hold off on increasing her dose  Blood pressure on her visit [id number] systolic  Recommend she call us if she starts getting more chest pain,  At that time could try higher dose isosorbide but may need to hold the amlodipine  Thx  Sabrina Montoya

## 2023-02-01 NOTE — Telephone Encounter (Signed)
Spoke with pt and she stated that she had called Dr. Donivan Scull office and got her medications figured out.

## 2023-02-02 ENCOUNTER — Other Ambulatory Visit: Payer: Self-pay | Admitting: Cardiovascular Disease

## 2023-02-06 ENCOUNTER — Telehealth: Payer: Self-pay | Admitting: Internal Medicine

## 2023-02-06 NOTE — Telephone Encounter (Signed)
I have spoken with pt about the cost and let her know that there is nothing else we can due. She has a deductible that has to be meet before it will be a lower cost to her.

## 2023-02-06 NOTE — Telephone Encounter (Signed)
Pt called stating she was suppose to start on wegovy but her tooth is bothering her and she heard the wegovy gives you nausea. Pt also states the wegovy is 500 dollars

## 2023-02-12 ENCOUNTER — Other Ambulatory Visit: Payer: Self-pay | Admitting: Internal Medicine

## 2023-02-13 ENCOUNTER — Ambulatory Visit (INDEPENDENT_AMBULATORY_CARE_PROVIDER_SITE_OTHER): Payer: Medicare Other | Admitting: Urology

## 2023-02-13 ENCOUNTER — Other Ambulatory Visit
Admission: RE | Admit: 2023-02-13 | Discharge: 2023-02-13 | Disposition: A | Discharge status: Home / Self Care | Payer: Medicare Other | Attending: Urology | Admitting: Urology

## 2023-02-13 VITALS — BP 131/65 | HR 76 | Ht 64.0 in | Wt 148.7 lb

## 2023-02-13 DIAGNOSIS — N281 Cyst of kidney, acquired: Secondary | ICD-10-CM | POA: Insufficient documentation

## 2023-02-13 LAB — URINALYSIS, COMPLETE (UACMP) WITH MICROSCOPIC
Bilirubin Urine: NEGATIVE
Glucose, UA: NEGATIVE mg/dL
Hgb urine dipstick: NEGATIVE
Ketones, ur: NEGATIVE mg/dL
Nitrite: NEGATIVE
Protein, ur: NEGATIVE mg/dL
RBC / HPF: NONE SEEN RBC/hpf (ref 0–5)
Specific Gravity, Urine: 1.02 (ref 1.005–1.030)
pH: 5.5 (ref 5.0–8.0)

## 2023-02-13 NOTE — Patient Instructions (Signed)
Chronic Back Pain Chronic back pain is back pain that lasts longer than 3 months. The cause of your back pain may not be known. Some common causes include: Wear and tear (degenerative disease) of the bones, disks, or tissues that connect bones to each other (ligaments) in your back. Inflammation and stiffness in your back (arthritis). If you have chronic back pain, you may have times when the pain is more intense (flare-ups). You can also learn to manage the pain with home care. Follow these instructions at home: Watch for any changes in your symptoms. Take these actions to help with your pain: Managing pain and stiffness     If told, put ice on the painful area. You may be told to apply ice for the first 24-48 hours after a flare-up starts. Put ice in a plastic bag. Place a towel between your skin and the bag. Leave the ice on for 20 minutes, 2-3 times per day. If told, apply heat to the affected area as often as told by your health care provider. Use the heat source that your provider recommends, such as a moist heat pack or a heating pad. Place a towel between your skin and the heat source. Leave the heat on for 20-30 minutes. If your skin turns bright red, remove the ice or heat right away to prevent skin damage. The risk of damage is higher if you cannot feel pain, heat, or cold. Try soaking in a warm tub. Activity        Avoid bending and other activities that make the pain worse. Have good posture when you stand or sit. When you stand, keep your upper back and neck straight, with your shoulders pulled back. Avoid slouching. When you sit, keep your back straight. Relax your shoulders. Do not round your shoulders or pull them backward. Do not sit or stand in one place for too long. Take brief periods of rest during the day. This will reduce your pain. Resting in a lying or standing position is often better than sitting to rest. When you rest for longer periods, mix in some mild  activity or stretching between periods of rest. This will help to prevent stiffness and pain. Get regular exercise. Ask your provider what activities are safe for you. You may have to avoid lifting. Ask your provider how much you can safely lift. If you do lift, always use the right technique. This means you should: Bend your knees. Keep the load close to your body. Avoid twisting. Medicines Take over-the-counter and prescription medicines only as told by your provider. You may need to take medicines for pain and inflammation. These may be taken by mouth or put on the skin. You may also be given muscle relaxants. Ask your provider if the medicine prescribed to you: Requires you to avoid driving or using machinery. Can cause constipation. You may need to take these actions to prevent or treat constipation: Drink enough fluid to keep your pee (urine) pale yellow. Take over-the-counter or prescription medicines. Eat foods that are high in fiber, such as beans, whole grains, and fresh fruits and vegetables. Limit foods that are high in fat and processed sugars, such as fried or sweet foods. General instructions  Sleep on a firm mattress in a comfortable position. Try lying on your side with your knees slightly bent. If you lie on your back, put a pillow under your knees. Do not use any products that contain nicotine or tobacco. These products include cigarettes, chewing tobacco,   and vaping devices, such as e-cigarettes. If you need help quitting, ask your provider. Contact a health care provider if: You have pain that does not get better with rest or medicine. You have new pain. You have a fever. You lose weight quickly. You have trouble doing your normal activities. You feel weak or numb in one or both of your legs or feet. Get help right away if: You are not able to control when you pee or poop. You have severe back pain and: Nausea or vomiting. Pain in your chest or abdomen. Shortness  of breath. You faint. These symptoms may be an emergency. Get help right away. Call 911. Do not wait to see if the symptoms will go away. Do not drive yourself to the hospital. This information is not intended to replace advice given to you by your health care provider. Make sure you discuss any questions you have with your health care provider. Document Revised: 06/12/2022 Document Reviewed: 06/12/2022 Elsevier Patient Education  2023 Elsevier Inc.  Managing Chronic Back Pain Chronic back pain is pain that lasts longer than 3 months. It often affects the lower back. It may feel like a muscle ache or a sharp, stabbing pain. It can be mild, moderate, or severe. There are things you can do to help manage your pain. See what works best for you. Your health care provider may also give you other instructions. What actions can I take to manage my chronic back pain? You may be given a treatment plan by your provider. Treatment often starts with rest and pain relief. It may also include: Physical therapy. These are exercises to help restore movement and strength to your back. Techniques to help you relax. Counseling or therapy. Cognitive behavioral therapy (CBT) is a form of therapy that helps you set goals and make changes. Acupuncture or massage therapy. Local electrical stimulation. Injections. You may be given medicines to numb an area or relieve pain. If other treatments do not help, you may need surgery. How to use body mechanics and posture to help with pain You can help relieve stress on your back with good posture and healthy body mechanics. Body mechanics are all the ways your body moves during the day. Posture is part of body mechanics. Good posture means: Your spine is in its correct S-curve, or neutral, position. Your shoulders are pulled back a bit. Your head is not tipped forward. To improve your posture and body mechanics, follow these guidelines. Standing  When standing, keep  your feet about hip-width apart. Keep your knees slightly bent. Your ears, shoulders, and hips should line up. Your spine should be neutral. When you stand in one place for a long time, place one foot on a stable object that is 2-4 inches (5-10 cm) high, such as a footstool. Sitting  When sitting, keep your feet flat on the floor. Use a footrest, if needed. Keep your thighs parallel to the floor. Try not to round your shoulders or tilt your head forward. When working at a desk or a computer: Position your desk so your hands are a little lower than your elbows. Slide your chair under your desk so you are close enough to have good posture. Position your monitor so you are looking straight ahead and do not have to tilt your head to view the screen. Lifting  Keep your feet shoulder-width apart. Tighten the muscles of your abdomen. Bend your knees and hips. Keep your spine neutral. Lift using the strength of your  legs, not your back. Do not lock your knees straight out. Ask for help to lift heavy or awkward objects. Resting  Do not lie down in a way that causes pain. If you have pain when you sit, bend, stoop, or squat, lie in a way that your body does not bend much. Try not to curl up on your side with your arms and knees near your chest (fetal position). If it hurts to stand for a long time or reach with your arms, lie with your spine neutral and knees bent slightly. Try lying: On your side with a pillow between your knees. On your back with a pillow under your knees. How to recognize changes in your chronic back pain Let your provider know if your pain gets worse or does not get better with treatment. Your back pain may be getting worse if you have pain that: Starts to cause problems with your posture. Gets worse when you sit, stand, walk, bend, or lift things. Happens when you are active, at rest, or both. Makes it hard for you to move around (limits mobility). Occurs with fever, weight  loss, or trouble peeing (urinating). Causes numbness and tingling. Follow these instructions at home: Medicines You may need to take medicines for pain and inflammation. These may be taken by mouth or put on the skin. You may also be given muscle relaxants. Take over-the-counter and prescription medicines only as told by your provider. Ask your provider if the medicine prescribed to you: Requires you to avoid driving or using machinery. Can cause constipation. You may need to take these actions to prevent or treat constipation: Drink enough fluid to keep your pee (urine) pale yellow. Take over-the-counter or prescription medicines. Eat foods that are high in fiber, such as beans, whole grains, and fresh fruits and vegetables. Limit foods that are high in fat and processed sugars, such as fried or sweet foods. Lifestyle Do not use any products that contain nicotine or tobacco. These products include cigarettes, chewing tobacco, and vaping devices, such as e-cigarettes. If you need help quitting, ask your provider. Eat a healthy diet. Eat lots of vegetables, fruits, fish, and lean meats. Work with your provider to stay at a healthy weight. General instructions Get regular exercise as told. Exercise can help with flexibility and strength. If physical therapy was prescribed, do exercises as told by your provider. Use ice or heat therapy as told by your provider. Where can I get support? Think about joining a support group for people with chronic back pain. You can find some groups at: Pain Connection Program: painconnection.org The American Chronic Pain Association: acpanow.com Contact a health care provider if: Your pain does not get better with rest or medicine. You have new pain. You have a fever. You lose weight quickly. You have trouble doing your normal activities. You feel weak or numb in one or both of your legs or feet. Get help right away if: You are not able to control when  you pee or poop. You have severe back pain and: Nausea or vomiting. Pain in your chest or abdomen. Shortness of breath. You faint. These symptoms may be an emergency. Get help right away. Call 911. Do not wait to see if the symptoms will go away. Do not drive yourself to the hospital. This information is not intended to replace advice given to you by your health care provider. Make sure you discuss any questions you have with your health care provider. Document Revised: 06/12/2022 Document  Reviewed: 06/12/2022 Elsevier Patient Education  2023 [employer]

## 2023-02-13 NOTE — Progress Notes (Signed)
02/13/23 3:24 PM   Sabrina Montoya 05/04/1941 [id number]  CC: Right renal cyst  HPI: 82 year old female with cardiac history, chronic back pain who is referred for a right renal cyst.  Most recent imaging with CT abdomen and pelvis with contrast from 10/05/2022 shows a 3.7 cm interpolar right renal cyst, this is minimally changed from prior imaging, including CT as far back as 2013 when this measured 3 cm.  She denies any gross hematuria or urinary symptoms.  She feels her urine has been thicker like olive oil recently.  No UA to review recently.  She would like to give a urine sample today.   PMH: Past Medical History:  Diagnosis Date   Abdominal aortic aneurysm without mention of rupture    infrarenal, stable, folllowed by Festus Barren   Acute posthemorrhagic anemia    Arthritis    B12 deficiency    CAD (coronary artery disease), autologous vein bypass graft    Cardiac dysrhythmia, unspecified    Colon polyp    Dental infection 01/17/2022   Depression    Diverticulitis of colon    Dizziness    DUE TO MEDICINES   Dysrhythmia    GERD (gastroesophageal reflux disease)    HOH (hard of hearing)    Hyperlipidemia    Hypertension    pt denies. placed on meds after CABG   IBS (irritable bowel syndrome)    Neuropathy    Neuropathy 07/01/2013   Peripheral vascular disease (HCC)    s/p CEA    Reflux esophagitis    Rheumatic fever    possible at age 67   Sliding hiatal hernia    Tobacco abuse    Tobacco abuse     Surgical History: Past Surgical History:  Procedure Laterality Date   ABDOMINAL HYSTERECTOMY     ABDOMINAL SURGERY  1976   for pain secondary to scar tissue, s/p apply   APPENDECTOMY  1974   BACK SURGERY     CAROTID ENDARTERECTOMY     CATARACT EXTRACTION W/PHACO Right 10/18/2016   Procedure: CATARACT EXTRACTION PHACO AND INTRAOCULAR LENS PLACEMENT (IOC);  Surgeon: Sallee Lange, MD;  Location: [facility];  Service: Ophthalmology;  Laterality: Right;  Lot #  [id number] H [place]: 01:04.0 AP%: 24.4 CDE: 30.44   CATARACT EXTRACTION W/PHACO Left 03/14/2017   Procedure: CATARACT EXTRACTION PHACO AND INTRAOCULAR LENS PLACEMENT (IOC)  Left;  Surgeon: Lockie Mola, MD;  Location: [facility];  Service: Ophthalmology;  Laterality: Left;   CEA     Carotid stenosis found during workup for dysphagia,  [facility]   CERVICAL DISC SURGERY  2009   Dr. Elvis Coil for cervical cord stenosis, C5-6 diskectomy   CHOLECYSTECTOMY  2002   Dr. Michela Pitcher   CLOSED MANIPULATION SHOULDER     left shoulder post physical therapy, Right shoulder redo (toggle bolts)   COLONOSCOPY  09/01/14   coronary angiography  01/2011   one occluded artery with collateralization, other arteries patent   CORONARY ARTERY BYPASS GRAFT  2006   s/p triple bypass surgery, [facility]   heart catherization  2016   HERNIA REPAIR  may 2011   Dr. Michela Pitcher   hernia repair     LEFT HEART CATH AND CORONARY ANGIOGRAPHY N/A 06/07/2021   Procedure: LEFT HEART CATH AND CORONARY ANGIOGRAPHY;  Surgeon: Marcina Millard, MD;  Location: [facility];  Service: Cardiovascular;  Laterality: N/A;   LUMBAR DISC SURGERY  1971   L5, unssuccessful, fusion in 1983 successful (2 lumbar)  PARTIAL HYSTERECTOMY     ROTATOR CUFF REPAIR  2002   right shoulder Dr. Vernell Leep, [facility] in [facility]   VASCULAR SURGERY     Family History: Family History  Problem Relation Age of Onset   Diabetes Mother    Heart disease Mother    Other Father        suicide   Cancer Sister        cervical cancer   Diabetes Sister    Diabetes Sister    Cancer Sister        breast   Breast cancer Sister    Heart disease Brother        coronary artery disease   Other Brother        suicide   Other Other        colon resection due to inflammation -nephew   Bladder Cancer Neg Hx    Kidney cancer Neg Hx     Social History:  reports that she quit smoking about 10 years ago. Her smoking use included cigarettes.  She has a 15.00 pack-year smoking history. She has never used smokeless tobacco. She reports that she does not drink alcohol and does not use drugs.  Physical Exam: BP 131/65 (BP Location: Left Arm, Patient Position: Sitting, Cuff Size: Normal)   Pulse 76   Ht 5\' 4"  (1.626 m)   Wt 148 lb 11.2 oz (67.4 kg)   BMI 25.52 kg/m    Constitutional:  Alert and oriented, No acute distress. Cardiovascular: No clubbing, cyanosis, or edema. Respiratory: Normal respiratory effort, no increased work of breathing. GI: Abdomen is soft, nontender, nondistended, no abdominal masses   Pertinent Imaging: I have personally viewed and interpreted the multiple CT scans with minimal change in the size of a right-sided renal cyst over the last 10+ years, no definitive enhancement  Assessment & Plan:   82 year old female with most likely benign right-sided renal cyst, as this has grown less than 1 cm in the last 10+ years, no definitive enhancement on recent CT with contrast.  We discussed options like an MRI for further evaluation, but she is comfortable deferring further imaging which I think is reasonable based on the stability over the last 10 years.  She would like a urinalysis checked today and will call with those results.  Reassurance provided regarding stable right renal cyst present for 10+ years, will call with urinalysis results per patient request  Legrand Rams, MD 02/13/2023  [facility] [address] [place], [place] [address] [phone]

## 2023-02-14 ENCOUNTER — Telehealth: Payer: Self-pay

## 2023-02-14 NOTE — Telephone Encounter (Signed)
-----   Message from Sondra Come, MD sent at 02/14/2023  8:13 AM EDT ----- No blood or definitive infection on UA, will call with culture results  Legrand Rams, MD 02/14/2023

## 2023-02-14 NOTE — Telephone Encounter (Signed)
Called pt informed her of the information below. Pt voiced understanding.  

## 2023-02-15 LAB — URINE CULTURE: Culture: 60000 — AB

## 2023-02-16 ENCOUNTER — Telehealth: Payer: Self-pay

## 2023-02-16 DIAGNOSIS — B9620 Unspecified Escherichia coli [E. coli] as the cause of diseases classified elsewhere: Secondary | ICD-10-CM

## 2023-02-16 LAB — URINE CULTURE

## 2023-02-16 MED ORDER — NITROFURANTOIN MONOHYD MACRO 100 MG PO CAPS
100.0000 mg | ORAL_CAPSULE | Freq: Two times a day (BID) | ORAL | 0 refills | Status: AC
Start: 2023-02-16 — End: 2023-02-21

## 2023-02-16 NOTE — Telephone Encounter (Signed)
-----   Message from Sondra Come, MD sent at 02/16/2023 10:34 AM EDT ----- Urine culture grew a small amount of bacteria, would recommend Macrobid 100 mg twice daily x 5 days  Legrand Rams, MD 02/16/2023

## 2023-02-16 NOTE — Telephone Encounter (Signed)
Called pt informed her of the information below. Pt voiced understanding, states that she is currently finishing a course of Penicillin for tooth abscess and questions taking 2 antibiotics at one time. Advised pt not to start Macrobid until penicillin completed.

## 2023-03-01 DIAGNOSIS — Z961 Presence of intraocular lens: Secondary | ICD-10-CM | POA: Diagnosis not present

## 2023-03-01 DIAGNOSIS — H353113 Nonexudative age-related macular degeneration, right eye, advanced atrophic without subfoveal involvement: Secondary | ICD-10-CM | POA: Diagnosis not present

## 2023-03-01 DIAGNOSIS — H353122 Nonexudative age-related macular degeneration, left eye, intermediate dry stage: Secondary | ICD-10-CM | POA: Diagnosis not present

## 2023-03-01 DIAGNOSIS — H40003 Preglaucoma, unspecified, bilateral: Secondary | ICD-10-CM | POA: Diagnosis not present

## 2023-03-07 DIAGNOSIS — S93401A Sprain of unspecified ligament of right ankle, initial encounter: Secondary | ICD-10-CM | POA: Diagnosis not present

## 2023-03-07 DIAGNOSIS — S86911A Strain of unspecified muscle(s) and tendon(s) at lower leg level, right leg, initial encounter: Secondary | ICD-10-CM | POA: Diagnosis not present

## 2023-03-21 DIAGNOSIS — S93401A Sprain of unspecified ligament of right ankle, initial encounter: Secondary | ICD-10-CM | POA: Diagnosis not present

## 2023-04-09 ENCOUNTER — Telehealth: Payer: Self-pay | Admitting: Internal Medicine

## 2023-04-09 NOTE — Telephone Encounter (Signed)
Spoke with pharmacist and gave the dx code.

## 2023-04-09 NOTE — Telephone Encounter (Signed)
Pt called stating the pharmacy states she need a ICD 10 for mounjaro

## 2023-04-16 ENCOUNTER — Telehealth: Payer: Self-pay | Admitting: Internal Medicine

## 2023-04-16 NOTE — Telephone Encounter (Signed)
Error

## 2023-04-20 ENCOUNTER — Telehealth: Payer: Self-pay

## 2023-04-20 ENCOUNTER — Ambulatory Visit (INDEPENDENT_AMBULATORY_CARE_PROVIDER_SITE_OTHER): Payer: Medicare Other

## 2023-04-20 DIAGNOSIS — E1140 Type 2 diabetes mellitus with diabetic neuropathy, unspecified: Secondary | ICD-10-CM

## 2023-04-20 NOTE — Progress Notes (Signed)
Patient arrived for [facility] teaching. Morrie Sheldon and myself watched a [name] how to video with the Patient then went over the Instructions again and gave her a Print out. Patient then administered the [facility] by herself into the Left Abdomen and did it correctly. Patient verbalized understanding and seemed to be more comfortable after the teaching and administration. Per verbal order of Dr. Lorin Picket. Medicine administration teaching provided.

## 2023-04-20 NOTE — Telephone Encounter (Signed)
If neck pain and stiffness and recent abscess, needs to be evaluated.

## 2023-04-20 NOTE — Telephone Encounter (Signed)
Patient came in for [facility] teaching and stated she had an abscess and the dentist put her on Penicillin back around 02/13/23 and now she is having trouble turning her head to the left and her head and neck is painful and weak. Patient went back to the dentist and he saw the infection was still there so he prescribed another round of antibiotics on 04/19/23.  Patient does not know if her stiff neck, pain and weakness is coming from the abscess or where she had cervical surgery. Patient stated this same issue happened after the first round of antibiotics.Please advise.

## 2023-04-20 NOTE — Telephone Encounter (Signed)
Called Patient with Dr. Roby Lofts recommendation and she stated she has a lot to do today but she will get ready and go to [facility]. I advised to go to [facility] walk in or the ED.

## 2023-04-20 NOTE — Telephone Encounter (Signed)
Agree needs to be seen today.  Can follow up and confirm seen.

## 2023-04-23 NOTE — Telephone Encounter (Signed)
Patient states she did not go to be evaluated on 04/20/23 due to already being out and about earlier that day. Patient states she has been taking the Penicillin and is a little better than she was and she has an appointment with Dr. Darrick Huntsman on 04/25/23.

## 2023-04-25 ENCOUNTER — Ambulatory Visit: Payer: Medicare Other | Admitting: Internal Medicine

## 2023-05-15 DIAGNOSIS — H353113 Nonexudative age-related macular degeneration, right eye, advanced atrophic without subfoveal involvement: Secondary | ICD-10-CM | POA: Diagnosis not present

## 2023-05-15 DIAGNOSIS — H353122 Nonexudative age-related macular degeneration, left eye, intermediate dry stage: Secondary | ICD-10-CM | POA: Diagnosis not present

## 2023-05-28 ENCOUNTER — Telehealth: Payer: Self-pay | Admitting: Internal Medicine

## 2023-05-28 NOTE — Telephone Encounter (Signed)
Copied from CRM [id number]. Topic: Medicare AWV >> May 28, 2023  3:38 PM Payton Doughty wrote: Reason for CRM: LVM 05/28/23 to r/s AWV. New AWV date 06/05/23 at 9:30am Please confirm date change. [facility]  Verlee Rossetti; Care Guide Ambulatory Clinical Support [place] [facility] Direct Dial: [phone]

## 2023-05-29 ENCOUNTER — Telehealth: Payer: Self-pay | Admitting: Internal Medicine

## 2023-05-29 DIAGNOSIS — E782 Mixed hyperlipidemia: Secondary | ICD-10-CM

## 2023-05-29 DIAGNOSIS — E1140 Type 2 diabetes mellitus with diabetic neuropathy, unspecified: Secondary | ICD-10-CM

## 2023-05-29 NOTE — Telephone Encounter (Signed)
Labs have been ordered and appt has been scheduled. Pt is aware of appt date and time.  

## 2023-05-29 NOTE — Addendum Note (Signed)
Addended by: Sandy Salaam on: 05/29/2023 02:51 PM   Modules accepted: Orders

## 2023-05-29 NOTE — Telephone Encounter (Signed)
Patient would like to do labs before her 06/04/2023 appointment, no labs have been ordered.

## 2023-06-01 ENCOUNTER — Other Ambulatory Visit (INDEPENDENT_AMBULATORY_CARE_PROVIDER_SITE_OTHER): Payer: Medicare Other

## 2023-06-01 DIAGNOSIS — E782 Mixed hyperlipidemia: Secondary | ICD-10-CM | POA: Diagnosis not present

## 2023-06-01 DIAGNOSIS — E1140 Type 2 diabetes mellitus with diabetic neuropathy, unspecified: Secondary | ICD-10-CM

## 2023-06-01 LAB — COMPREHENSIVE METABOLIC PANEL
ALT: 17 U/L (ref 0–35)
AST: 39 U/L — ABNORMAL HIGH (ref 0–37)
Albumin: 4.6 g/dL (ref 3.5–5.2)
Alkaline Phosphatase: 61 U/L (ref 39–117)
BUN: 25 mg/dL — ABNORMAL HIGH (ref 6–23)
CO2: 26 mEq/L (ref 19–32)
Calcium: 9.8 mg/dL (ref 8.4–10.5)
Chloride: 108 mEq/L (ref 96–112)
Creatinine, Ser: 1.08 mg/dL (ref 0.40–1.20)
GFR: 48.34 mL/min — ABNORMAL LOW (ref >60.00)
Glucose, Bld: 112 mg/dL — ABNORMAL HIGH (ref 70–99)
Potassium: 4.1 mEq/L (ref 3.5–5.1)
Sodium: 143 mEq/L (ref 135–145)
Total Bilirubin: 0.5 mg/dL (ref 0.2–1.2)
Total Protein: 7.1 g/dL (ref 6.0–8.3)

## 2023-06-01 LAB — LDL CHOLESTEROL, DIRECT: Direct LDL: 50 mg/dL

## 2023-06-01 LAB — LIPID PANEL
Cholesterol: 116 mg/dL (ref 0–200)
HDL: 36.4 mg/dL — ABNORMAL LOW (ref >39.00)
LDL Cholesterol: 47 mg/dL (ref 0–99)
NonHDL: 79.42
Total CHOL/HDL Ratio: 3
Triglycerides: 160 mg/dL — ABNORMAL HIGH (ref 0.0–149.0)
VLDL: 32 mg/dL (ref 0.0–40.0)

## 2023-06-01 LAB — MICROALBUMIN / CREATININE URINE RATIO
Creatinine,U: 113.9 mg/dL
Microalb Creat Ratio: 2.1 mg/g (ref 0.0–30.0)
Microalb, Ur: 2.4 mg/dL — ABNORMAL HIGH (ref 0.0–1.9)

## 2023-06-01 LAB — HEMOGLOBIN A1C: Hgb A1c MFr Bld: 6.5 % (ref 4.6–6.5)

## 2023-06-04 ENCOUNTER — Telehealth (INDEPENDENT_AMBULATORY_CARE_PROVIDER_SITE_OTHER): Payer: Medicare Other | Admitting: Internal Medicine

## 2023-06-04 ENCOUNTER — Telehealth: Payer: Self-pay | Admitting: Internal Medicine

## 2023-06-04 ENCOUNTER — Encounter: Payer: Self-pay | Admitting: Internal Medicine

## 2023-06-04 DIAGNOSIS — Z9181 History of falling: Secondary | ICD-10-CM

## 2023-06-04 DIAGNOSIS — E1140 Type 2 diabetes mellitus with diabetic neuropathy, unspecified: Secondary | ICD-10-CM | POA: Diagnosis not present

## 2023-06-04 DIAGNOSIS — I6523 Occlusion and stenosis of bilateral carotid arteries: Secondary | ICD-10-CM

## 2023-06-04 DIAGNOSIS — I25810 Atherosclerosis of coronary artery bypass graft(s) without angina pectoris: Secondary | ICD-10-CM | POA: Diagnosis not present

## 2023-06-04 DIAGNOSIS — I25118 Atherosclerotic heart disease of native coronary artery with other forms of angina pectoris: Secondary | ICD-10-CM | POA: Diagnosis not present

## 2023-06-04 DIAGNOSIS — I209 Angina pectoris, unspecified: Secondary | ICD-10-CM

## 2023-06-04 MED ORDER — COVID-19 HOME COLLECTION TEST VI KIT: PACK | 1 refills | Status: DC

## 2023-06-04 MED ORDER — OMEPRAZOLE 20 MG PO CPDR: DELAYED_RELEASE_CAPSULE | ORAL | 1 refills | Status: DC

## 2023-06-04 NOTE — Telephone Encounter (Signed)
Pt called back about her in person appt today with provider. She stated she does not know what's going on with Dr. Darrick Huntsman, because her appt for June was also cancel. Pt stated she just stated a new med and haven't seen provider since then. Pt asked to see another provider, we does not have any available openings for the day. Pt stated she does not have pc for the appt, but willing to through her cell phone.

## 2023-06-04 NOTE — Assessment & Plan Note (Signed)
S/P NSTEMI with severe 3 vessel disease noted on recent cardiac catheterization.  Symptoms of angina have resolved .  Continue current regimen of imdur

## 2023-06-04 NOTE — Assessment & Plan Note (Addendum)
Previous nightly occurrence has stopped Occurring at night with transition from recliner to bed.  She did not tolerate Ranexa trial in 2023 due to reports of excessive fatigue (which she continues to report despite stopping medication) currently controlled with 30 mg  at dinner ,  60 mg at bedtime

## 2023-06-04 NOTE — Assessment & Plan Note (Signed)
Continue serial monitoring ,  no indication for surgery by Jan 2024 dopplers.  Continue asa and Crestor

## 2023-06-04 NOTE — Assessment & Plan Note (Signed)
She has has a second opinion from Dr Mariah Milling and Kirke Corin regarding her recurrent chest pain.  She was prescribed Raneexa but has been unable to start it due to cost. . She is tolerating metoprolol  75 mg once daily ( 50mg  tablet 1.5 tablets. ) to equal 75 mg once daily.

## 2023-06-04 NOTE — Progress Notes (Signed)
Virtual Visit via Caregility   Note   This format is felt to be most appropriate for this patient at this time.  All issues noted in this document were discussed and addressed.  No physical exam was performed (except for noted visual exam findings with Video Visits).   I connected with Sabrina Montoya  on 06/04/23 at  2:30 PM EDT by a video enabled telemedicine application  and verified that I am speaking with the correct person using two identifiers. Location patient: home Location provider: work or home office Persons participating in the virtual visit: patient, provider  I discussed the limitations, risks, security and privacy concerns of performing an evaluation and management service by telephone and the availability of in person appointments. I also discussed with the patient that there may be a patient responsible charge related to this service. The patient expressed understanding and agreed to proceed.   Reason for visit: diabetes follow up  HPI:  82 yr old female with type 2 DM   with unintentional  weight gain., CAD and  Started on [facility] in March still taking 2.5 mg dose . Recent labs reviewed.  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    CAD with chronic angina :  Chest pain : resolved has been taking imdur 30 at bedtime and imdur 60 at dinnertime.  H/o fall in garage with right ankle sprain May 1.  Wore  a boot for one week, then stopped BECAUSE OF DISCOMFORT    ROS: See pertinent positives and negatives per HPI.  Past Medical History:  Diagnosis Date   Abdominal aortic aneurysm without mention of rupture    infrarenal, stable, folllowed by Festus Barren   Acute posthemorrhagic anemia    Arthritis    B12 deficiency    CAD (coronary artery disease), autologous vein bypass graft    Cardiac dysrhythmia, unspecified    Colon polyp    Dental infection 01/17/2022   Depression    Diverticulitis of colon    Dizziness    DUE  TO MEDICINES   Dysrhythmia    GERD (gastroesophageal reflux disease)    HOH (hard of hearing)    Hyperlipidemia    Hypertension    pt denies. placed on meds after CABG   IBS (irritable bowel syndrome)    Neuropathy    Neuropathy 07/01/2013   Peripheral vascular disease (HCC)    s/p CEA    Reflux esophagitis    Rheumatic fever    possible at age 30   Sliding hiatal hernia    Tobacco abuse    Tobacco abuse     Past Surgical History:  Procedure Laterality Date   ABDOMINAL HYSTERECTOMY     ABDOMINAL SURGERY  1976   for pain secondary to scar tissue, s/p apply   APPENDECTOMY  1974   BACK SURGERY     CAROTID ENDARTERECTOMY     CATARACT EXTRACTION W/PHACO Right 10/18/2016   Procedure: CATARACT EXTRACTION PHACO AND INTRAOCULAR LENS PLACEMENT (IOC);  Surgeon: Sallee Lange, MD;  Location: [facility] ORS;  Service: Ophthalmology;  Laterality: Right;  Lot # [id number] H [place]: 01:04.0 AP%: 24.4 CDE: 30.44   CATARACT EXTRACTION W/PHACO Left 03/14/2017   Procedure: CATARACT EXTRACTION PHACO AND INTRAOCULAR LENS PLACEMENT (IOC)  Left;  Surgeon: Lockie Mola, MD;  Location: [facility];  Service: Ophthalmology;  Laterality: Left;   CEA     Carotid stenosis found during workup for dysphagia,  [facility]  CERVICAL DISC SURGERY  2009   Dr. Elvis Coil for cervical cord stenosis, C5-6 diskectomy   CHOLECYSTECTOMY  2002   Dr. Michela Pitcher   CLOSED MANIPULATION SHOULDER     left shoulder post physical therapy, Right shoulder redo (toggle bolts)   COLONOSCOPY  09/01/14   coronary angiography  01/2011   one occluded artery with collateralization, other arteries patent   CORONARY ARTERY BYPASS GRAFT  2006   s/p triple bypass surgery, [facility]   heart catherization  2016   HERNIA REPAIR  may 2011   Dr. Michela Pitcher   hernia repair     LEFT HEART CATH AND CORONARY ANGIOGRAPHY N/A 06/07/2021   Procedure: LEFT HEART CATH AND CORONARY ANGIOGRAPHY;  Surgeon: Marcina Millard, MD;  Location: [facility]  INVASIVE CV LAB;  Service: Cardiovascular;  Laterality: N/A;   LUMBAR DISC SURGERY  1971   L5, unssuccessful, fusion in 1983 successful (2 lumbar)   PARTIAL HYSTERECTOMY     ROTATOR CUFF REPAIR  2002   right shoulder Dr. Vernell Leep, [facility] in [facility]   VASCULAR SURGERY      Family History  Problem Relation Age of Onset   Diabetes Mother    Heart disease Mother    Other Father        suicide   Cancer Sister        cervical cancer   Diabetes Sister    Diabetes Sister    Cancer Sister        breast   Breast cancer Sister    Heart disease Brother        coronary artery disease   Other Brother        suicide   Other Other        colon resection due to inflammation -nephew   Bladder Cancer Neg Hx    Kidney cancer Neg Hx     SOCIAL HX:  reports that she quit smoking about 11 years ago. Her smoking use included cigarettes. She started smoking about 41 years ago. She has a 15 pack-year smoking history. She has never used smokeless tobacco. She reports that she does not drink alcohol and does not use drugs.    Current Outpatient Medications:    amLODipine (NORVASC) 5 MG tablet, Take 1 tablet (5 mg total) by mouth daily., Disp: 90 tablet, Rfl: 3   aspirin 81 MG tablet, Take 81 mg by mouth daily., Disp: , Rfl:    cholecalciferol (VITAMIN D) 1000 UNITS tablet, Take 1,000 Units by mouth daily., Disp: , Rfl:    COVID-19 Home Collection Test KIT, Use as needed for signs and symptoms of COVID INFECTION, Disp: 4 kit, Rfl: 1   isosorbide mononitrate (IMDUR) 30 MG 24 hr tablet, Take 30 mg by mouth 2 (two) times daily., Disp: , Rfl:    isosorbide mononitrate (IMDUR) 60 MG 24 hr tablet, Take 1 tablet (60 mg total) by mouth daily., Disp: 90 tablet, Rfl: 3   metoprolol succinate (TOPROL-XL) 50 MG 24 hr tablet, Take 1 tablet (50 mg total) by mouth daily. Take with or immediately following a meal., Disp: 90 tablet, Rfl: 3   Multiple Vitamins-Minerals (PRESERVISION AREDS 2 PO), Take by mouth  2 (two) times daily., Disp: , Rfl:    nitroGLYCERIN (NITROSTAT) 0.4 MG SL tablet, DISSOLVE 1 TABLET UNDER THE TONGUE EVERY 5 MINUTES AS NEEDED FOR CHEST PAIN, Disp: 50 tablet, Rfl: 4   rosuvastatin (CRESTOR) 40 MG tablet, Take 1 tablet (40 mg total) by mouth daily., Disp: 90 tablet,  Rfl: 3   tirzepatide (MOUNJARO) 2.5 MG/0.5ML Pen, Inject 2.5 mg into the skin once a week., Disp: 2 mL, Rfl: 2   tiZANidine (ZANAFLEX) 2 MG tablet, TAKE 1 TABLET(2 MG) BY MOUTH AT BEDTIME, Disp: 30 tablet, Rfl: 0   Turmeric (QC TUMERIC COMPLEX PO), Take 1 mg by mouth daily., Disp: , Rfl:    albuterol (VENTOLIN HFA) 108 (90 Base) MCG/ACT inhaler, Inhale 2 puffs into the lungs every 6 (six) hours as needed for wheezing or shortness of breath. (Patient not taking: Reported on 06/04/2023), Disp: 8 g, Rfl: 0   omeprazole (PRILOSEC) 20 MG capsule, TAKE 1 CAPSULE(20 MG) BY MOUTH DAILY, Disp: 90 capsule, Rfl: 1  EXAM:  VITALS per patient if applicable:  GENERAL: alert, oriented, appears well and in no acute distress  HEENT: atraumatic, conjunttiva clear, no obvious abnormalities on inspection of external nose and ears  NECK: normal movements of the head and neck  LUNGS: on inspection no signs of respiratory distress, breathing rate appears normal, no obvious gross SOB, gasping or wheezing  CV: no obvious cyanosis  MS: moves all visible extremities without noticeable abnormality  PSYCH/NEURO: pleasant and cooperative, no obvious depression or anxiety, speech and thought processing grossly intact  ASSESSMENT AND PLAN: Angina pectoris (HCC) Assessment & Plan: Previous nightly occurrence has stopped Occurring at night with transition from recliner to bed.  She did not tolerate Ranexa trial in 2023 due to reports of excessive fatigue (which she continues to report despite stopping medication) currently controlled with 30 mg  at dinner ,  60 mg at bedtime     Bilateral carotid artery stenosis Assessment &  Plan: Continue serial monitoring ,  no indication for surgery by Jan 2024 dopplers.  Continue asa and Crestor    Coronary artery disease involving autologous vein coronary bypass graft without angina pectoris Assessment & Plan: She has has a second opinion from Dr Mariah Milling and Kirke Corin regarding her recurrent chest pain.  She was prescribed Raneexa but has been unable to start it due to cost. . She is tolerating metoprolol  75 mg once daily ( 50mg  tablet 1.5 tablets. ) to equal 75 mg once daily.    Controlled type 2 diabetes with neuropathy (HCC) Assessment & Plan: A1c is 6.5  and sh has lost 14 lbs with Mounjaro .   she nas new onset microalbuminuria but she has a history of acute renal failure during trial of telmisartan . Paient is up-to-date on eye exams . continue asa and crestor.  She needs a RN visit to help her overcome her needle phobia   Lab Results  Component Value Date   HGBA1C 6.5 06/01/2023   Lab Results  Component Value Date   MICROALBUR 2.4 (H) 06/01/2023   Lab Results  Component Value Date   CREATININE 1.08 06/01/2023      Coronary artery disease of native artery of native heart with stable angina pectoris (HCC) Assessment & Plan: S/P NSTEMI with severe 3 vessel disease noted on recent cardiac catheterization.  Symptoms of angina have resolved .  Continue current regimen of imdur    Other orders -     Omeprazole; TAKE 1 CAPSULE(20 MG) BY MOUTH DAILY  Dispense: 90 capsule; Refill: 1 -     COVID-19 Home Collection Test; Use as needed for signs and symptoms of COVID INFECTION  Dispense: 4 kit; Refill: 1      I discussed the assessment and treatment plan with the patient. The patient was provided an  opportunity to ask questions and all were answered. The patient agreed with the plan and demonstrated an understanding of the instructions.   The patient was advised to call back or seek an in-person evaluation if the symptoms worsen or if the condition fails to improve as  anticipated.   I spent 40  minutes dedicated to the care of this patient on the date of this encounter to include pre-visit review of Ora's medical history,  Face-to-face time with the patient , and post visit ordering of testing and therapeutics.    Sherlene Shams, MD

## 2023-06-04 NOTE — Assessment & Plan Note (Addendum)
A1c is 6.5  and sh has lost 14 lbs with Mounjaro .   she nas new onset microalbuminuria but she has a history of acute renal failure during trial of telmisartan . Paient is up-to-date on eye exams . continue asa and crestor.  She needs a RN visit to help her overcome her needle phobia   Lab Results  Component Value Date   HGBA1C 6.5 06/01/2023   Lab Results  Component Value Date   MICROALBUR 2.4 (H) 06/01/2023   Lab Results  Component Value Date   CREATININE 1.08 06/01/2023

## 2023-06-04 NOTE — Telephone Encounter (Signed)
noted 

## 2023-06-04 NOTE — Assessment & Plan Note (Signed)
Occurred  on May 1,  while in garage,  sprained right ankle .  Symptoms now resolved

## 2023-06-05 ENCOUNTER — Ambulatory Visit: Payer: Medicare Other | Admitting: *Deleted

## 2023-06-05 VITALS — Ht 64.0 in | Wt 140.0 lb

## 2023-06-05 DIAGNOSIS — Z0000 Encounter for general adult medical examination without abnormal findings: Secondary | ICD-10-CM | POA: Diagnosis not present

## 2023-06-05 NOTE — Patient Instructions (Signed)
Sabrina Montoya , Thank you for taking time to come for your Medicare Wellness Visit. I appreciate your ongoing commitment to your health goals. Please review the following plan we discussed and let me know if I can assist you in the future.   Referrals/Orders/Follow-Ups/Clinician Recommendations: None  This is a list of the screening recommended for you and due dates:  Health Maintenance  Topic Date Due   DTaP/Tdap/Td vaccine (1 - Tdap) Never done   Zoster (Shingles) Vaccine (1 of 2) Never done   COVID-19 Vaccine (4 - 2023-24 season) 07/07/2022   Eye exam for diabetics  02/05/2023   Flu Shot  06/07/2023   Mammogram  07/12/2023   Hemoglobin A1C  12/02/2023   Complete foot exam   01/23/2024   Yearly kidney function blood test for diabetes  05/31/2024   Yearly kidney health urinalysis for diabetes  05/31/2024   Medicare Annual Wellness Visit  06/04/2024   Pneumonia Vaccine  Completed   DEXA scan (bone density measurement)  Completed   HPV Vaccine  Aged Out   Hepatitis C Screening  Discontinued    Advanced directives: (Declined) Advance directive discussed with you today. Even though you declined this today, please call our office should you change your mind, and we can give you the proper paperwork for you to fill out. Patient will work on   Next [employer] Visit scheduled for next year: Yes 06/10/24 @ 1:00   Preventive Care 65 Years and Older, Female Preventive care refers to lifestyle choices and visits with your health care provider that can promote health and wellness. What does preventive care include? A yearly physical exam. This is also called an annual well check. Dental exams once or twice a year. Routine eye exams. Ask your health care provider how often you should have your eyes checked. Personal lifestyle choices, including: Daily care of your teeth and gums. Regular physical activity. Eating a healthy diet. Avoiding tobacco and drug use. Limiting alcohol  use. Practicing safe sex. Taking low-dose aspirin every day. Taking vitamin and mineral supplements as recommended by your health care provider. What happens during an annual well check? The services and screenings done by your health care provider during your annual well check will depend on your age, overall health, lifestyle risk factors, and family history of disease. Counseling  Your health care provider may ask you questions about your: Alcohol use. Tobacco use. Drug use. Emotional well-being. Home and relationship well-being. Sexual activity. Eating habits. History of falls. Memory and ability to understand (cognition). Work and work Astronomer. Reproductive health. Screening  You may have the following tests or measurements: Height, weight, and BMI. Blood pressure. Lipid and cholesterol levels. These may be checked every 5 years, or more frequently if you are over 38 years old. Skin check. Lung cancer screening. You may have this screening every year starting at age 78 if you have a 30-pack-year history of smoking and currently smoke or have quit within the past 15 years. Fecal occult blood test (FOBT) of the stool. You may have this test every year starting at age 2. Flexible sigmoidoscopy or colonoscopy. You may have a sigmoidoscopy every 5 years or a colonoscopy every 10 years starting at age 61. Hepatitis C blood test. Hepatitis B blood test. Sexually transmitted disease (STD) testing. Diabetes screening. This is done by checking your blood sugar (glucose) after you have not eaten for a while (fasting). You may have this done every 1-3 years. Bone density scan. This  is done to screen for osteoporosis. You may have this done starting at age 1. Mammogram. This may be done every 1-2 years. Talk to your health care provider about how often you should have regular mammograms. Talk with your health care provider about your test results, treatment options, and if necessary,  the need for more tests. Vaccines  Your health care provider may recommend certain vaccines, such as: Influenza vaccine. This is recommended every year. Tetanus, diphtheria, and acellular pertussis (Tdap, Td) vaccine. You may need a Td booster every 10 years. Zoster vaccine. You may need this after age 70. Pneumococcal 13-valent conjugate (PCV13) vaccine. One dose is recommended after age 61. Pneumococcal polysaccharide (PPSV23) vaccine. One dose is recommended after age 49. Talk to your health care provider about which screenings and vaccines you need and how often you need them. This information is not intended to replace advice given to you by your health care provider. Make sure you discuss any questions you have with your health care provider. Document Released: 11/19/2015 Document Revised: 07/12/2016 Document Reviewed: 08/24/2015 Elsevier Interactive Patient Education  2017 [employer].  Fall Prevention in the Home Falls can cause injuries. They can happen to people of all ages. There are many things you can do to make your home safe and to help prevent falls. What can I do on the outside of my home? Regularly fix the edges of walkways and driveways and fix any cracks. Remove anything that might make you trip as you walk through a door, such as a raised step or threshold. Trim any bushes or trees on the path to your home. Use bright outdoor lighting. Clear any walking paths of anything that might make someone trip, such as rocks or tools. Regularly check to see if handrails are loose or broken. Make sure that both sides of any steps have handrails. Any raised decks and porches should have guardrails on the edges. Have any leaves, snow, or ice cleared regularly. Use sand or salt on walking paths during winter. Clean up any spills in your garage right away. This includes oil or grease spills. What can I do in the bathroom? Use night lights. Install grab bars by the toilet and in the  tub and shower. Do not use towel bars as grab bars. Use non-skid mats or decals in the tub or shower. If you need to sit down in the shower, use a plastic, non-slip stool. Keep the floor dry. Clean up any water that spills on the floor as soon as it happens. Remove soap buildup in the tub or shower regularly. Attach bath mats securely with double-sided non-slip rug tape. Do not have throw rugs and other things on the floor that can make you trip. What can I do in the bedroom? Use night lights. Make sure that you have a light by your bed that is easy to reach. Do not use any sheets or blankets that are too big for your bed. They should not hang down onto the floor. Have a firm chair that has side arms. You can use this for support while you get dressed. Do not have throw rugs and other things on the floor that can make you trip. What can I do in the kitchen? Clean up any spills right away. Avoid walking on wet floors. Keep items that you use a lot in easy-to-reach places. If you need to reach something above you, use a strong step stool that has a grab bar. Keep electrical cords out  of the way. Do not use floor polish or wax that makes floors slippery. If you must use wax, use non-skid floor wax. Do not have throw rugs and other things on the floor that can make you trip. What can I do with my stairs? Do not leave any items on the stairs. Make sure that there are handrails on both sides of the stairs and use them. Fix handrails that are broken or loose. Make sure that handrails are as long as the stairways. Check any carpeting to make sure that it is firmly attached to the stairs. Fix any carpet that is loose or worn. Avoid having throw rugs at the top or bottom of the stairs. If you do have throw rugs, attach them to the floor with carpet tape. Make sure that you have a light switch at the top of the stairs and the bottom of the stairs. If you do not have them, ask someone to add them for  you. What else can I do to help prevent falls? Wear shoes that: Do not have high heels. Have rubber bottoms. Are comfortable and fit you well. Are closed at the toe. Do not wear sandals. If you use a stepladder: Make sure that it is fully opened. Do not climb a closed stepladder. Make sure that both sides of the stepladder are locked into place. Ask someone to hold it for you, if possible. Clearly mark and make sure that you can see: Any grab bars or handrails. First and last steps. Where the edge of each step is. Use tools that help you move around (mobility aids) if they are needed. These include: Canes. Walkers. Scooters. Crutches. Turn on the lights when you go into a dark area. Replace any light bulbs as soon as they burn out. Set up your furniture so you have a clear path. Avoid moving your furniture around. If any of your floors are uneven, fix them. If there are any pets around you, be aware of where they are. Review your medicines with your doctor. Some medicines can make you feel dizzy. This can increase your chance of falling. Ask your doctor what other things that you can do to help prevent falls. This information is not intended to replace advice given to you by your health care provider. Make sure you discuss any questions you have with your health care provider. Document Released: 08/19/2009 Document Revised: 03/30/2016 Document Reviewed: 11/27/2014 Elsevier Interactive Patient Education  2017 [employer].

## 2023-06-05 NOTE — Progress Notes (Addendum)
Subjective:   Sabrina Montoya is a 82 y.o. female who presents for Medicare Annual (Subsequent) preventive examination.  Visit Complete: Virtual  I connected with  KEARSTEN EAVES on 06/05/23 by a audio enabled telemedicine application and verified that I am speaking with the correct person using two identifiers.  Patient Location: Home  Provider Location: Office/Clinic  I discussed the limitations of evaluation and management by telemedicine. The patient expressed understanding and agreed to proceed.  Vital Signs: Unable to obtain new vitals due to this being a telehealth visit.   Review of Systems     Cardiac Risk Factors include: advanced age (>45men, >31 women);diabetes mellitus;dyslipidemia;Other (see comment), Risk factor comments: CAD, History of MI, Bradycardia     Objective:    Today's Vitals   06/05/23 0938  Weight: 140 lb (63.5 kg)  Height: 5\' 4"  (1.626 m)  PainSc: 6    Body mass index is 24.03 kg/m.     06/05/2023   10:06 AM 09/18/2022    5:11 PM 05/30/2022   12:58 PM 08/18/2021   10:40 AM 07/22/2021   12:04 PM 06/07/2021   10:30 AM 06/06/2021   10:00 AM  Advanced Directives  Does Patient Have a Medical Advance Directive? No No No No Yes No No  Type of Agricultural consultant;Living will    Does patient want to make changes to medical advance directive?     No - Patient declined    Copy of Healthcare Power of Attorney in Chart?     No - copy requested    Would patient like information on creating a medical advance directive?   No - Patient declined No - Patient declined  No - Patient declined No - Patient declined    Current Medications (verified) Outpatient Encounter Medications as of 06/05/2023  Medication Sig   albuterol (VENTOLIN HFA) 108 (90 Base) MCG/ACT inhaler Inhale 2 puffs into the lungs every 6 (six) hours as needed for wheezing or shortness of breath.   amLODipine (NORVASC) 5 MG tablet Take 1 tablet (5 mg total) by mouth daily.    aspirin 81 MG tablet Take 81 mg by mouth daily.   cholecalciferol (VITAMIN D) 1000 UNITS tablet Take 1,000 Units by mouth daily.   COVID-19 Home Collection Test KIT Use as needed for signs and symptoms of COVID INFECTION   isosorbide mononitrate (IMDUR) 30 MG 24 hr tablet Take 30 mg by mouth 2 (two) times daily.   isosorbide mononitrate (IMDUR) 60 MG 24 hr tablet Take 1 tablet (60 mg total) by mouth daily.   metoprolol succinate (TOPROL-XL) 50 MG 24 hr tablet Take 1 tablet (50 mg total) by mouth daily. Take with or immediately following a meal.   Multiple Vitamins-Minerals (PRESERVISION AREDS 2 PO) Take by mouth 2 (two) times daily.   nitroGLYCERIN (NITROSTAT) 0.4 MG SL tablet DISSOLVE 1 TABLET UNDER THE TONGUE EVERY 5 MINUTES AS NEEDED FOR CHEST PAIN   omeprazole (PRILOSEC) 20 MG capsule TAKE 1 CAPSULE(20 MG) BY MOUTH DAILY   rosuvastatin (CRESTOR) 40 MG tablet Take 1 tablet (40 mg total) by mouth daily.   tirzepatide [facility]) 2.5 MG/0.5ML Pen Inject 2.5 mg into the skin once a week.   tiZANidine (ZANAFLEX) 2 MG tablet TAKE 1 TABLET(2 MG) BY MOUTH AT BEDTIME   Turmeric (QC TUMERIC COMPLEX PO) Take 1 mg by mouth daily.   No facility-administered encounter medications on file as of 06/05/2023.    Allergies (verified)  Telmisartan, Niacin, Niacin and related, Sertraline hcl, Sulfa antibiotics, Sulfa drugs cross reactors, Tetracycline, Tetracyclines & related, Tizanidine, Latex, and Simvastatin   History: Past Medical History:  Diagnosis Date   Abdominal aortic aneurysm without mention of rupture    infrarenal, stable, folllowed by Festus Barren   Acute posthemorrhagic anemia    Arthritis    B12 deficiency    CAD (coronary artery disease), autologous vein bypass graft    Cardiac dysrhythmia, unspecified    Colon polyp    Dental infection 01/17/2022   Depression    Diverticulitis of colon    Dizziness    DUE TO MEDICINES   Dysrhythmia    GERD (gastroesophageal reflux disease)    HOH  (hard of hearing)    Hyperlipidemia    Hypertension    pt denies. placed on meds after CABG   IBS (irritable bowel syndrome)    Neuropathy    Neuropathy 07/01/2013   Peripheral vascular disease (HCC)    s/p CEA    Reflux esophagitis    Rheumatic fever    possible at age 45   Sliding hiatal hernia    Tobacco abuse    Tobacco abuse    Past Surgical History:  Procedure Laterality Date   ABDOMINAL HYSTERECTOMY     ABDOMINAL SURGERY  1976   for pain secondary to scar tissue, s/p apply   APPENDECTOMY  1974   BACK SURGERY     CAROTID ENDARTERECTOMY     CATARACT EXTRACTION W/PHACO Right 10/18/2016   Procedure: CATARACT EXTRACTION PHACO AND INTRAOCULAR LENS PLACEMENT (IOC);  Surgeon: Sallee Lange, MD;  Location: [facility] ORS;  Service: Ophthalmology;  Laterality: Right;  Lot # [id number] H [place]: 01:04.0 AP%: 24.4 CDE: 30.44   CATARACT EXTRACTION W/PHACO Left 03/14/2017   Procedure: CATARACT EXTRACTION PHACO AND INTRAOCULAR LENS PLACEMENT (IOC)  Left;  Surgeon: Lockie Mola, MD;  Location: [facility];  Service: Ophthalmology;  Laterality: Left;   CEA     Carotid stenosis found during workup for dysphagia,  [facility]   CERVICAL DISC SURGERY  2009   Dr. Elvis Coil for cervical cord stenosis, C5-6 diskectomy   CHOLECYSTECTOMY  2002   Dr. Michela Pitcher   CLOSED MANIPULATION SHOULDER     left shoulder post physical therapy, Right shoulder redo (toggle bolts)   COLONOSCOPY  09/01/14   coronary angiography  01/2011   one occluded artery with collateralization, other arteries patent   CORONARY ARTERY BYPASS GRAFT  2006   s/p triple bypass surgery, [facility]   heart catherization  2016   HERNIA REPAIR  may 2011   Dr. Michela Pitcher   hernia repair     LEFT HEART CATH AND CORONARY ANGIOGRAPHY N/A 06/07/2021   Procedure: LEFT HEART CATH AND CORONARY ANGIOGRAPHY;  Surgeon: Marcina Millard, MD;  Location: [facility]IVE [facility];  Service: Cardiovascular;  Laterality: N/A;   LUMBAR DISC SURGERY   1971   L5, unssuccessful, fusion in 1983 successful (2 lumbar)   PARTIAL HYSTERECTOMY     ROTATOR CUFF REPAIR  2002   right shoulder Dr. Vernell Leep, [facility]   VASCULAR SURGERY     Family History  Problem Relation Age of Onset   Diabetes Mother    Heart disease Mother    Other Father        suicide   Cancer Sister        cervical cancer   Diabetes Sister    Diabetes Sister    Cancer Sister  breast   Breast cancer Sister    Heart disease Brother        coronary artery disease   Other Brother        suicide   Other Other        colon resection due to inflammation -nephew   Bladder Cancer Neg Hx    Kidney cancer Neg Hx    Social History   Socioeconomic History   Marital status: Widowed    Spouse name: Not on file   Number of children: 2   Years of education: 86   Highest education level: 12th grade  Occupational History   Not on file  Tobacco Use   Smoking status: Former    Current packs/day: 0.00    Average packs/day: 0.5 packs/day for 30.0 years (15.0 ttl pk-yrs)    Types: Cigarettes    Start date: 04/01/1982    Quit date: 04/01/2012    Years since quitting: 11.1   Smokeless tobacco: Never   Tobacco comments:    quit for 2 years after sinus infection and 6 months after heart surgery  Vaping Use   Vaping status: Never Used  Substance and Sexual Activity   Alcohol use: No   Drug use: No   Sexual activity: Not Currently  Other Topics Concern   Not on file  Social History Narrative   Widowed   Social Determinants of Health   Financial Resource Strain: Low Risk  (06/05/2023)   Overall Financial Resource Strain (CARDIA)    Difficulty of Paying Living Expenses: Not hard at all  Food Insecurity: No Food Insecurity (06/05/2023)   Hunger Vital Sign    Worried About Running Out of Food in the Last Year: Never true    Ran Out of Food in the Last Year: Never true  Transportation Needs: No Transportation Needs (06/05/2023)   PRAPARE -  Administrator, Civil Service (Medical): No    Lack of Transportation (Non-Medical): No  Physical Activity: Inactive (06/05/2023)   Exercise Vital Sign    Days of Exercise per Week: 0 days    Minutes of Exercise per Session: 0 min  Stress: No Stress Concern Present (06/05/2023)   [employer] of Occupational Health - Occupational Stress Questionnaire    Feeling of Stress : Only a little  Social Connections: Socially Isolated (06/05/2023)   Social Connection and Isolation Panel [NHANES]    Frequency of Communication with Friends and Family: More than three times a week    Frequency of Social Gatherings with Friends and Family: Once a week    Attends Religious Services: Never    Database administrator or Organizations: No    Attends Banker Meetings: Never    Marital Status: Widowed    Tobacco Counseling Counseling given: Not Answered Tobacco comments: quit for 2 years after sinus infection and 6 months after heart surgery   Clinical Intake:  Pre-visit preparation completed: Yes  Pain : 0-10 Pain Score: 6  Pain Type: Chronic pain Pain Location: Back Pain Orientation: Lower (history of back sugery) Pain Descriptors / Indicators: Burning Pain Onset: More than a month ago Pain Frequency: Intermittent Pain Relieving Factors: medication  Pain Relieving Factors: medication  BMI - recorded: 24.03 Nutritional Status: BMI of 19-24  Normal Nutritional Risks: None Diabetes: Yes CBG done?: No Did pt. bring in CBG monitor from home?: No  How often do you need to have someone help you when you read instructions, pamphlets, or other written materials  from your doctor or pharmacy?: 1 - Never  Interpreter Needed?: No  Information entered by :: R.  LPN   Activities of Daily Living    06/05/2023    9:43 AM  In your present state of health, do you have any difficulty performing the following activities:  Hearing? 0  Vision? 1  Comment glasses, has  macular degeneration  Difficulty concentrating or making decisions? 1  Comment at times  Walking or climbing stairs? 1  Comment since Heart attack  Dressing or bathing? 0  Doing errands, shopping? 0  Preparing Food and eating ? N  Using the Toilet? N  In the past six months, have you accidently leaked urine? N  Do you have problems with loss of bowel control? N  Managing your Medications? N  Managing your Finances? Y  Comment daughters help  Housekeeping or managing your Housekeeping? N    Patient Care Team: Sherlene Shams, MD as PCP - General (Internal Medicine) Sherlene Shams, MD (Internal Medicine) Lemar Livings Merrily Pew, MD (General Surgery)  Indicate any recent Medical Services you may have received from other than [facility] providers in the past year (date may be approximate).     Assessment:   This is a routine wellness examination for [place].  Hearing/Vision screen Hearing Screening - Comments:: No issues Vision Screening - Comments:: Has macular degeneration  Dietary issues and exercise activities discussed:     Goals Addressed             This Visit's Progress    Patient Stated       Working on losing weight, working on getting A1C down       Depression Screen    06/05/2023    9:57 AM 10/04/2022    4:52 PM 07/21/2022    1:21 PM 05/30/2022   12:55 PM 01/17/2022    1:39 PM 12/19/2021   11:16 AM 12/05/2021   10:31 AM  PHQ 2/9 Scores  PHQ - 2 Score 0 0 0 0 0 0 0  PHQ- 9 Score 3          Fall Risk    06/05/2023    9:51 AM 06/04/2023    2:33 PM 10/04/2022    4:52 PM 07/21/2022    1:21 PM 05/30/2022   12:55 PM  Fall Risk   Falls in the past year? 1 1 0 0 0  Number falls in past yr: 0 0  0   Injury with Fall? 1 1  0   Comment sprained right foot      Risk for fall due to : History of fall(s);Impaired balance/gait History of fall(s) No Fall Risks No Fall Risks   Follow up Falls prevention discussed;Falls evaluation completed;Education provided Falls evaluation  completed Falls evaluation completed Falls evaluation completed Falls evaluation completed    MEDICARE RISK AT HOME:  Medicare Risk at Home - 06/05/23 0952     Any stairs in or around the home? Yes    If so, are there any without handrails? No    Home free of loose throw rugs in walkways, pet beds, electrical cords, etc? Yes    Adequate lighting in your home to reduce risk of falls? Yes    Life alert? Yes    Use of a cane, walker or w/c? No    Grab bars in the bathroom? Yes    Shower chair or bench in shower? No    Elevated toilet seat or a handicapped toilet? Yes  Cognitive Function:    02/02/2016    2:12 PM  MMSE - Mini Mental State Exam  Orientation to time 5  Orientation to Place 5  Registration 3  Attention/ Calculation 5  Recall 3  Language- name 2 objects 2  Language- repeat 1  Language- follow 3 step command 3  Language- read & follow direction 1  Write a sentence 1  Copy design 1  Total score 30        06/05/2023   10:06 AM 04/26/2020   11:30 AM 04/24/2019   11:06 AM 02/12/2018    1:59 PM 02/08/2017   12:20 PM  6CIT Screen  What Year? 0 points 0 points 0 points 0 points 0 points  What month? 0 points  0 points 0 points 0 points  What time? 0 points  0 points 0 points 0 points  Count back from 20 0 points  0 points 0 points 0 points  Months in reverse 2 points 0 points 0 points 0 points 0 points  Repeat phrase 0 points 0 points  0 points 0 points  Total Score 2 points   0 points 0 points    Immunizations Immunization History  Administered Date(s) Administered   Fluad Quad(high Dose 65+) 08/01/2019, 08/18/2021, 07/21/2022   Influenza Split 10/10/2011, 08/26/2012   Influenza, High Dose Seasonal PF 08/16/2016, 11/09/2017, 09/06/2018   Influenza,inj,Quad PF,6+ Mos 07/25/2013, 07/28/2014, 08/13/2015   Influenza-Unspecified 08/06/2012   PFIZER(Purple Top)SARS-COV-2 Vaccination 11/12/2019, 12/03/2019, 08/06/2020   Pneumococcal Conjugate-13  08/26/2014   Pneumococcal Polysaccharide-23 09/02/2010    TDAP status: Due, Education has been provided regarding the importance of this vaccine. Advised may receive this vaccine at local pharmacy or Health Dept. Aware to provide a copy of the vaccination record if obtained from local pharmacy or Health Dept. Verbalized acceptance and understanding.  Flu Vaccine status: Up to date  Pneumococcal vaccine status: Up to date  Covid-19 vaccine status: Completed vaccines  Qualifies for Shingles Vaccine? Yes   Zostavax completed Yes   Shingrix Completed?: Yes Patient states that she has had them and will provide the date  Screening Tests Health Maintenance  Topic Date Due   DTaP/Tdap/Td (1 - Tdap) Never done   Zoster Vaccines- Shingrix (1 of 2) Never done   COVID-19 Vaccine (4 - 2023-24 season) 07/07/2022   OPHTHALMOLOGY EXAM  02/05/2023   Medicare Annual Wellness (AWV)  05/31/2023   INFLUENZA VACCINE  06/07/2023   MAMMOGRAM  07/12/2023   HEMOGLOBIN A1C  12/02/2023   FOOT EXAM  01/23/2024   Diabetic kidney evaluation - eGFR measurement  05/31/2024   Diabetic kidney evaluation - Urine ACR  05/31/2024   Pneumonia Vaccine 8+ Years old  Completed   DEXA SCAN  Completed   HPV VACCINES  Aged Out   Hepatitis C Screening  Discontinued    Health Maintenance  Health Maintenance Due  Topic Date Due   DTaP/Tdap/Td (1 - Tdap) Never done   Zoster Vaccines- Shingrix (1 of 2) Never done   COVID-19 Vaccine (4 - 2023-24 season) 07/07/2022   OPHTHALMOLOGY EXAM  02/05/2023   Medicare Annual Wellness (AWV)  05/31/2023    Colorectal cancer screening: No longer required.   Mammogram status: Completed 9/23. Repeat every year  Bone Density status: Completed 9/20. Results reflect: Bone density results: OSTEOPENIA. Repeat every 2 years., declines at this time  Lung Cancer Screening: (Low Dose CT Chest recommended if Age 21-80 years, 20 pack-year currently smoking OR have quit w/in 15years.) does  not  qualify.     Additional Screening:  Hepatitis C Screening: does not qualify; Completed 5/17  Vision Screening: Recommended annual ophthalmology exams for early detection of glaucoma and other disorders of the eye. Is the patient up to date with their annual eye exam?  Yes  Who is the provider or what is the name of the office in which the patient attends annual eye exams? [facility] If pt is not established with a provider, would they like to be referred to a provider to establish care? No .   Dental Screening: Recommended annual dental exams for proper oral hygiene  Diabetic Foot Exam: Diabetic Foot Exam: Completed 3/24  Community Resource Referral / Chronic Care Management: CRR required this visit?  No   CCM required this visit?  No     Plan:     I have personally reviewed and noted the following in the patient's chart:   Medical and social history Use of alcohol, tobacco or illicit drugs  Current medications and supplements including opioid prescriptions. Patient is not currently taking opioid prescriptions. Functional ability and status Nutritional status Physical activity Advanced directives List of other physicians Hospitalizations, surgeries, and ER visits in previous 12 months Vitals Screenings to include cognitive, depression, and falls Referrals and appointments  In addition, I have reviewed and discussed with patient certain preventive protocols, quality metrics, and best practice recommendations. A written personalized care plan for preventive services as well as general preventive health recommendations were provided to patient.     Sydell Axon, LPN   12/20/863   After Visit Summary: (MyChart) Due to this being a telephonic visit, the after visit summary with patients personalized plan was offered to patient via MyChart   Nurse Notes: None   I have reviewed the above information and agree with above.   Duncan Dull, MD

## 2023-06-07 ENCOUNTER — Ambulatory Visit: Payer: Medicare Other

## 2023-06-07 NOTE — Progress Notes (Signed)
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 Darrick Huntsman. Patient arrived for her visit we talked and then she talked me through the steps she needed to do. Patient administered Mounjaro prefilled pen to her self in the left abdomen. Patient did the [facility] administration properly. Patient believes she got worked up at home by her self and there was back ground noise. I advised she cut off all back ground noise and think that we are right there with her and do just as she did today in office. Patient was also advised if she needs [place] to call or send a message. Patient understands and is agreeable.

## 2023-07-03 ENCOUNTER — Other Ambulatory Visit: Payer: Self-pay

## 2023-07-03 MED ORDER — TIRZEPATIDE 2.5 MG/0.5ML ~~LOC~~ SOAJ: 2.5000 mg | SUBCUTANEOUS | 2 refills | Status: DC

## 2023-07-17 ENCOUNTER — Telehealth: Payer: Self-pay

## 2023-07-17 NOTE — Telephone Encounter (Signed)
Patient states she is experiencing sore throat (can hardly swallow), cough, negative for covid so far.  I scheduled an appointment for patient for tomorrow with Dr. Marikay Alar.  Patient states she needs to be seen in person and cannot drive to [place].  Patient states she would like to know if there is anything she can do between now and the time of the appointment.

## 2023-07-17 NOTE — Telephone Encounter (Signed)
I need to see her to fully figure out what to do for her. If she is not able to swallow at all or if swallowing becomes more difficult she needs to be seen before tomorrow. Please see what she has been doing for her symptoms so far. Thanks.

## 2023-07-17 NOTE — Telephone Encounter (Signed)
Spoke with pt and she stated that she has only used cough drops. Pt stated that she went to a wedding on Friday and her symptoms of sore throat and runny nose started on Sunday evening. Pt stated she did a home covid test last night and it was negative. Pt stated that she has never had issues with allergies before so she "isn't sure what it is". I advised pt that she could have tested too soon and she could try testing herself again but she stated that she would feel more comfortable coming in a having [place] swab her again.

## 2023-07-18 ENCOUNTER — Ambulatory Visit (INDEPENDENT_AMBULATORY_CARE_PROVIDER_SITE_OTHER): Payer: Medicare Other | Admitting: Family Medicine

## 2023-07-18 ENCOUNTER — Encounter: Payer: Self-pay | Admitting: Family Medicine

## 2023-07-18 VITALS — BP 122/82 | HR 56 | Temp 98.7°F | Ht 64.0 in | Wt 139.2 lb

## 2023-07-18 DIAGNOSIS — J029 Acute pharyngitis, unspecified: Secondary | ICD-10-CM | POA: Diagnosis not present

## 2023-07-18 DIAGNOSIS — R059 Cough, unspecified: Secondary | ICD-10-CM | POA: Diagnosis not present

## 2023-07-18 DIAGNOSIS — U071 COVID-19: Secondary | ICD-10-CM

## 2023-07-18 LAB — POC COVID19 BINAXNOW: SARS Coronavirus 2 Ag: POSITIVE — AB

## 2023-07-18 MED ORDER — NIRMATRELVIR/RITONAVIR (PAXLOVID) TABLET (RENAL DOSING)
2.0000 | ORAL_TABLET | Freq: Two times a day (BID) | ORAL | 0 refills | Status: AC
Start: 2023-07-18 — End: 2023-07-23

## 2023-07-18 NOTE — Patient Instructions (Addendum)
Nice to see you. We are treating you with Paxlovid for your COVID-19 infection. If you develop excessive diarrhea, elevated blood pressure, or any other significant side effects please let us know. If you develop shortness of breath, cough productive of blood, or fevers or any worsening symptoms please be reevaluated. Please stay home until you have improved for at least 24 hours and have been afebrile for 24 hours.  Please wear a mask through 07/24/2023 if you have to be around anybody.

## 2023-07-18 NOTE — Progress Notes (Signed)
Marikay Alar, MD Phone: [phone]  Sabrina Montoya is a 82 y.o. female who presents today for same day visit.   Sore throat: She notes onset of symptoms on Saturday or Sunday.  Has sore throat, postnasal drip, cough, and congestion.  No fevers or shortness of breath.  No taste or smell disturbances.  She notes a negative COVID test a day or 2 after symptom onset.  She has been using lozenges for temporary relief of her sore throat.  She does report she was at a wedding and somebody she was in contact with ended up with COVID.  Social History   Tobacco Use  Smoking Status Former   Current packs/day: 0.00   Average packs/day: 0.5 packs/day for 30.0 years (15.0 ttl pk-yrs)   Types: Cigarettes   Start date: 04/01/1982   Quit date: 04/01/2012   Years since quitting: 11.3  Smokeless Tobacco Never  Tobacco Comments   quit for 2 years after sinus infection and 6 months after heart surgery    Current Outpatient Medications on File Prior to Visit  Medication Sig Dispense Refill   albuterol (VENTOLIN HFA) 108 (90 Base) MCG/ACT inhaler Inhale 2 puffs into the lungs every 6 (six) hours as needed for wheezing or shortness of breath. 8 g 0   amLODipine (NORVASC) 5 MG tablet Take 1 tablet (5 mg total) by mouth daily. 90 tablet 3   aspirin 81 MG tablet Take 81 mg by mouth daily.     cholecalciferol (VITAMIN D) 1000 UNITS tablet Take 1,000 Units by mouth daily.     COVID-19 Home Collection Test KIT Use as needed for signs and symptoms of COVID INFECTION 4 kit 1   isosorbide mononitrate (IMDUR) 30 MG 24 hr tablet Take 30 mg by mouth 2 (two) times daily.     isosorbide mononitrate (IMDUR) 60 MG 24 hr tablet Take 1 tablet (60 mg total) by mouth daily. 90 tablet 3   metoprolol succinate (TOPROL-XL) 50 MG 24 hr tablet Take 1 tablet (50 mg total) by mouth daily. Take with or immediately following a meal. 90 tablet 3   Multiple Vitamins-Minerals (PRESERVISION AREDS 2 PO) Take by mouth 2 (two) times daily.      nitroGLYCERIN (NITROSTAT) 0.4 MG SL tablet DISSOLVE 1 TABLET UNDER THE TONGUE EVERY 5 MINUTES AS NEEDED FOR CHEST PAIN 50 tablet 4   omeprazole (PRILOSEC) 20 MG capsule TAKE 1 CAPSULE(20 MG) BY MOUTH DAILY 90 capsule 1   rosuvastatin (CRESTOR) 40 MG tablet Take 1 tablet (40 mg total) by mouth daily. 90 tablet 3   tirzepatide (MOUNJARO) 2.5 MG/0.5ML Pen Inject 2.5 mg into the skin once a week. 2 mL 2   tiZANidine (ZANAFLEX) 2 MG tablet TAKE 1 TABLET(2 MG) BY MOUTH AT BEDTIME 30 tablet 0   Turmeric (QC TUMERIC COMPLEX PO) Take 1 mg by mouth daily.     No current facility-administered medications on file prior to visit.     ROS see history of present illness  Objective  Physical Exam Vitals:   07/18/23 1050  BP: 122/82  Pulse: (!) 56  Temp: 98.7 F (37.1 C)  SpO2: 99%    BP Readings from Last 3 Encounters:  07/18/23 122/82  02/13/23 131/65  01/25/23 128/68   Wt Readings from Last 3 Encounters:  07/18/23 139 lb 3.2 oz (63.1 kg)  06/05/23 140 lb (63.5 kg)  02/13/23 148 lb 11.2 oz (67.4 kg)    Physical Exam Constitutional:      General: She  is not in acute distress.    Appearance: She is not diaphoretic.  HENT:     Right Ear: Tympanic membrane normal.     Ears:     Comments: Left TM obscured by cerumen    Mouth/Throat:     Mouth: Mucous membranes are moist.     Pharynx: Posterior oropharyngeal erythema present. No oropharyngeal exudate.  Cardiovascular:     Rate and Rhythm: Normal rate and regular rhythm.     Heart sounds: Normal heart sounds.  Pulmonary:     Effort: Pulmonary effort is normal.     Breath sounds: Normal breath sounds.  Skin:    General: Skin is warm and dry.  Neurological:     Mental Status: She is alert.      Assessment/Plan: Please see individual problem list.  COVID-19 Assessment & Plan: Patient with positive COVID test here in the office.  Discussed treatment options with Paxlovid.  Discussed that this may help her feel better a  little sooner though the main purpose is to reduce her risk of progression to severe illness.  Renal dosing Paxlovid will be sent to pharmacy.  She was advised she could start this when it has been at least 12 hours since her last dose of Crestor.  Advised not to take Crestor while on the Paxlovid and she can resume the Crestor 2 days after finishing up Paxlovid.  She is advised to stay home until she feels better for at least 24 hours and has been afebrile for at least 24 hours.  She was advised to wear a mask for a total of 10 days after symptom onset.  Discussed risk of diarrhea, taste disturbance, and elevated blood pressure with Paxlovid.  If she notices these or any other side effects she will let us know.  If she develops any shortness of breath, fevers, or worsening symptoms she will seek medical attention.  Orders: -     nirmatrelvir/ritonavir (renal dosing); Take 2 tablets by mouth 2 (two) times daily for 5 days. (Take nirmatrelvir 150 mg one tablet twice daily for 5 days and ritonavir 100 mg one tablet twice daily for 5 days) Patient GFR is 48  Dispense: 20 tablet; Refill: 0  Cough, unspecified type -     POC COVID-19 BinaxNow  Sore throat -     POC COVID-19 BinaxNow    Return if symptoms worsen or fail to improve.   Marikay Alar, MD [facility]

## 2023-07-18 NOTE — Progress Notes (Signed)
[id number] 

## 2023-07-18 NOTE — Telephone Encounter (Signed)
Noted.  Plan to see patient today as scheduled.

## 2023-07-18 NOTE — Assessment & Plan Note (Signed)
Patient with positive COVID test here in the office.  Discussed treatment options with Paxlovid.  Discussed that this may help her feel better a little sooner though the main purpose is to reduce her risk of progression to severe illness.  Renal dosing Paxlovid will be sent to pharmacy.  She was advised she could start this when it has been at least 12 hours since her last dose of Crestor.  Advised not to take Crestor while on the Paxlovid and she can resume the Crestor 2 days after finishing up Paxlovid.  She is advised to stay home until she feels better for at least 24 hours and has been afebrile for at least 24 hours.  She was advised to wear a mask for a total of 10 days after symptom onset.  Discussed risk of diarrhea, taste disturbance, and elevated blood pressure with Paxlovid.  If she notices these or any other side effects she will let us know.  If she develops any shortness of breath, fevers, or worsening symptoms she will seek medical attention.

## 2023-07-24 MED ORDER — BENZONATATE 200 MG PO CAPS: 200.0000 mg | ORAL_CAPSULE | Freq: Two times a day (BID) | ORAL | 0 refills | Status: DC | PRN

## 2023-07-24 NOTE — Telephone Encounter (Signed)
I have sent Tessalon in for her cough.  The cough after COVID can last for a number of weeks.  If it is not progressively improving over the next several weeks or if it significantly worsens or she develops other symptoms such as fevers or shortness of breath she needs to let us know.

## 2023-07-24 NOTE — Telephone Encounter (Signed)
Pt called stating she can not get rid of the cough and want to see if sonnenberg ( the one she saw on last wednesday) would provide something for her

## 2023-07-24 NOTE — Telephone Encounter (Signed)
Patient is aware that Tessalon pearls have been called in for her cough.

## 2023-07-24 NOTE — Addendum Note (Signed)
Addended by: Birdie Sons, Kiyoto Slomski G on: 07/24/2023 11:01 AM   Modules accepted: Orders

## 2023-07-27 ENCOUNTER — Telehealth: Payer: Self-pay | Admitting: Internal Medicine

## 2023-07-27 NOTE — Telephone Encounter (Signed)
Reviewed Dr. Purvis Sheffield note: "Advised not to take Crestor while on the Paxlovid and she can resume the Crestor 2 days after finishing up Paxlovid. "  Called and spoke with pt to relay information from Dr. Purvis Sheffield note.  She reports that she feels a lot better but "I will never take Paxlovid again" she reported that it tasted like metal.

## 2023-07-27 NOTE — Telephone Encounter (Signed)
Patient saw Dr Birdie Sons last week for COVID. Patient states that Dr Birdie Sons told her to stop taking  rosuvastatin (CRESTOR) 40 MG tablet, she wanted to know when she should start taking it again.

## 2023-08-23 ENCOUNTER — Other Ambulatory Visit: Payer: Self-pay | Admitting: *Deleted

## 2023-08-23 MED ORDER — ISOSORBIDE MONONITRATE ER 60 MG PO TB24: 60.0000 mg | ORAL_TABLET | Freq: Every day | ORAL | 3 refills | Status: DC

## 2023-08-28 DIAGNOSIS — Z23 Encounter for immunization: Secondary | ICD-10-CM | POA: Diagnosis not present

## 2023-08-29 DIAGNOSIS — Z23 Encounter for immunization: Secondary | ICD-10-CM | POA: Diagnosis not present

## 2023-09-03 DIAGNOSIS — H353122 Nonexudative age-related macular degeneration, left eye, intermediate dry stage: Secondary | ICD-10-CM | POA: Diagnosis not present

## 2023-09-03 DIAGNOSIS — H353114 Nonexudative age-related macular degeneration, right eye, advanced atrophic with subfoveal involvement: Secondary | ICD-10-CM | POA: Diagnosis not present

## 2023-09-03 DIAGNOSIS — H40003 Preglaucoma, unspecified, bilateral: Secondary | ICD-10-CM | POA: Diagnosis not present

## 2023-09-03 DIAGNOSIS — Z961 Presence of intraocular lens: Secondary | ICD-10-CM | POA: Diagnosis not present

## 2023-09-06 ENCOUNTER — Telehealth: Payer: Self-pay | Admitting: Internal Medicine

## 2023-09-06 NOTE — Telephone Encounter (Signed)
Patient just called and said she has been currently taking tirzepatide [facility]) 2.5 MG/0.5ML Pen and she wants to know what its doing for her A1C. Her number is [phone]. She would like for someone to call her.

## 2023-09-07 NOTE — Telephone Encounter (Signed)
Late entry. A lab appt & follow-up with Dr. Darrick Huntsman was scheduled for pt on 09/06/23. Pt has lab on 11/6 & f/u on 11/8. Pt was made aware on 09/06/23.

## 2023-09-08 ENCOUNTER — Other Ambulatory Visit: Payer: Self-pay | Admitting: Internal Medicine

## 2023-09-12 ENCOUNTER — Other Ambulatory Visit (INDEPENDENT_AMBULATORY_CARE_PROVIDER_SITE_OTHER): Payer: Medicare Other

## 2023-09-12 DIAGNOSIS — E1140 Type 2 diabetes mellitus with diabetic neuropathy, unspecified: Secondary | ICD-10-CM | POA: Diagnosis not present

## 2023-09-12 LAB — BASIC METABOLIC PANEL
BUN: 26 mg/dL — ABNORMAL HIGH (ref 6–23)
CO2: 28 meq/L (ref 19–32)
Calcium: 9.4 mg/dL (ref 8.4–10.5)
Chloride: 106 meq/L (ref 96–112)
Creatinine, Ser: 1.13 mg/dL (ref 0.40–1.20)
GFR: 45.7 mL/min — ABNORMAL LOW (ref >60.00)
Glucose, Bld: 101 mg/dL — ABNORMAL HIGH (ref 70–99)
Potassium: 4.5 meq/L (ref 3.5–5.1)
Sodium: 142 meq/L (ref 135–145)

## 2023-09-13 NOTE — Telephone Encounter (Signed)
Can a A1c be added to blood drawn yesterday.

## 2023-09-13 NOTE — Telephone Encounter (Signed)
Pt called stating she wanted her A1C done because of being on mounjaro but when she came and got her labs done the A1C was not added. Pt is very upset about this and would like for someone to call her

## 2023-09-14 ENCOUNTER — Ambulatory Visit: Payer: Medicare Other | Admitting: Internal Medicine

## 2023-09-14 NOTE — Telephone Encounter (Signed)
MyChart message:  I called 09/11/2023 to inquire why my A1C was not included in my Bloodwork since the object of the 11/6 apt. was in regards to the [facility] that I started in May. It seems to have stopped working as no more weight loss and no appetite control . that was the basis of checking the A1C. The lab nurse tried to use large needle, said there was no note in file to use Butterfly needle! I don't need the upset regarding needles everytime I go to lab. I don't know who cancelled apt. as the last comment was "I will check with DR. Tullo and get back . When I called this AM, front desk said my appt. was cancelled.  asked by  whom and she said patient.

## 2023-09-14 NOTE — Telephone Encounter (Signed)
Spoke with pt and apologized for all the mix up. Pt has been rescheduled for Monday and I let pt know that we can do an A1c in the office during her appt on Monday. Pt gave a verbal understanding.

## 2023-09-14 NOTE — Telephone Encounter (Signed)
LMTCB

## 2023-09-14 NOTE — Telephone Encounter (Signed)
Patient just called back and she is upset that no one has called her. She wants her A1C checked. Patient cancelled her appointment on the 7th because she stated her A1c was not checked being on the mounjaro. She would like for someone to call her. She states she did not cancel her appointment. Her number is [phone].

## 2023-09-17 ENCOUNTER — Ambulatory Visit (INDEPENDENT_AMBULATORY_CARE_PROVIDER_SITE_OTHER): Payer: Medicare Other | Admitting: Internal Medicine

## 2023-09-17 ENCOUNTER — Encounter: Payer: Self-pay | Admitting: Internal Medicine

## 2023-09-17 VITALS — BP 128/64 | HR 66 | Ht 64.0 in | Wt 141.4 lb

## 2023-09-17 DIAGNOSIS — N189 Chronic kidney disease, unspecified: Secondary | ICD-10-CM

## 2023-09-17 DIAGNOSIS — Z1231 Encounter for screening mammogram for malignant neoplasm of breast: Secondary | ICD-10-CM

## 2023-09-17 DIAGNOSIS — D631 Anemia in chronic kidney disease: Secondary | ICD-10-CM

## 2023-09-17 DIAGNOSIS — Z7984 Long term (current) use of oral hypoglycemic drugs: Secondary | ICD-10-CM | POA: Diagnosis not present

## 2023-09-17 DIAGNOSIS — E1140 Type 2 diabetes mellitus with diabetic neuropathy, unspecified: Secondary | ICD-10-CM | POA: Diagnosis not present

## 2023-09-17 LAB — POCT GLYCOSYLATED HEMOGLOBIN (HGB A1C): Hemoglobin A1C: 5.6 % (ref 4.0–5.6)

## 2023-09-17 LAB — MICROALBUMIN / CREATININE URINE RATIO
Creatinine,U: 76.3 mg/dL
Microalb Creat Ratio: 3 mg/g (ref 0.0–30.0)
Microalb, Ur: 2.3 mg/dL — ABNORMAL HIGH (ref 0.0–1.9)

## 2023-09-17 MED ORDER — TIRZEPATIDE 5 MG/0.5ML ~~LOC~~ SOAJ
5.0000 mg | SUBCUTANEOUS | 2 refills | Status: DC
Start: 2023-09-17 — End: 2024-05-20

## 2023-09-17 NOTE — Progress Notes (Signed)
Subjective:  Patient ID: Sabrina Montoya, female    DOB: 1941/01/10  Age: 82 y.o. MRN: [id number]  CC: The primary encounter diagnosis was Encounter for screening mammogram for malignant neoplasm of breast. Diagnoses of Controlled type 2 diabetes with neuropathy (HCC) and Anemia due to chronic kidney disease, unspecified CKD stage were also pertinent to this visit.   HPI Sabrina Montoya presents for  Chief Complaint  Patient presents with   Medical Management of Chronic Issues    1)T2DM;  taking mounjaro 2.5 mg and has lost 13 lbs since March  (18 by her home scales )  wants to increase dose   2) HTN:  taking amlodipine,  imdur,  Toprol  XL   3) COVID infection:  she was Treated by ES with Paxlovid in September for COVID.  Did not tolerate medication due to metallic taste in mouth  Outpatient Medications Prior to Visit  Medication Sig Dispense Refill   amLODipine (NORVASC) 5 MG tablet Take 1 tablet (5 mg total) by mouth daily. 90 tablet 3   aspirin 81 MG tablet Take 81 mg by mouth daily.     cholecalciferol (VITAMIN D) 1000 UNITS tablet Take 1,000 Units by mouth daily.     isosorbide mononitrate (IMDUR) 60 MG 24 hr tablet Take 1 tablet (60 mg total) by mouth daily. 90 tablet 3   metoprolol succinate (TOPROL-XL) 50 MG 24 hr tablet Take 1 tablet (50 mg total) by mouth daily. Take with or immediately following a meal. 90 tablet 3   Multiple Vitamins-Minerals (PRESERVISION AREDS 2 PO) Take by mouth 2 (two) times daily.     nitroGLYCERIN (NITROSTAT) 0.4 MG SL tablet DISSOLVE 1 TABLET UNDER THE TONGUE EVERY 5 MINUTES AS NEEDED FOR CHEST PAIN 50 tablet 4   omeprazole (PRILOSEC) 20 MG capsule TAKE 1 CAPSULE(20 MG) BY MOUTH DAILY 90 capsule 1   rosuvastatin (CRESTOR) 40 MG tablet Take 1 tablet (40 mg total) by mouth daily. 90 tablet 3   tiZANidine (ZANAFLEX) 2 MG tablet TAKE 1 TABLET(2 MG) BY MOUTH AT BEDTIME 30 tablet 0   Turmeric (QC TUMERIC COMPLEX PO) Take 1 mg by mouth daily.     tirzepatide  [facility]) 2.5 MG/0.5ML Pen Inject 2.5 mg into the skin once a week. 2 mL 2   benzonatate (TESSALON) 200 MG capsule Take 1 capsule (200 mg total) by mouth 2 (two) times daily as needed for cough. (Patient not taking: Reported on 09/17/2023) 20 capsule 0   albuterol (VENTOLIN HFA) 108 (90 Base) MCG/ACT inhaler Inhale 2 puffs into the lungs every 6 (six) hours as needed for wheezing or shortness of breath. (Patient not taking: Reported on 09/17/2023) 8 g 0   COVID-19 Home Collection Test KIT Use as needed for signs and symptoms of COVID INFECTION 4 kit 1   No facility-administered medications prior to visit.    Review of Systems;  Patient denies headache, fevers, malaise, unintentional weight loss, skin rash, eye pain, sinus congestion and sinus pain, sore throat, dysphagia,  hemoptysis , cough, dyspnea, wheezing, chest pain, palpitations, orthopnea, edema, abdominal pain, nausea, melena, diarrhea, constipation, flank pain, dysuria, hematuria, urinary  Frequency, nocturia, numbness, tingling, seizures,  Focal weakness, Loss of consciousness,  Tremor, insomnia, depression, anxiety, and suicidal ideation.      Objective:  BP 128/64   Pulse 66   Ht 5\' 4"  (1.626 m)   Wt 141 lb 6.4 oz (64.1 kg)   SpO2 96%   BMI 24.27 kg/m  BP Readings from Last 3 Encounters:  09/17/23 128/64  07/18/23 122/82  02/13/23 131/65    Wt Readings from Last 3 Encounters:  09/17/23 141 lb 6.4 oz (64.1 kg)  07/18/23 139 lb 3.2 oz (63.1 kg)  06/05/23 140 lb (63.5 kg)    Physical Exam Vitals reviewed.  Constitutional:      General: She is not in acute distress.    Appearance: Normal appearance. She is normal weight. She is not ill-appearing, toxic-appearing or diaphoretic.  HENT:     Head: Normocephalic.  Eyes:     General: No scleral icterus.       Right eye: No discharge.        Left eye: No discharge.     Conjunctiva/sclera: Conjunctivae normal.  Cardiovascular:     Rate and Rhythm: Normal rate and  regular rhythm.     Heart sounds: Normal heart sounds.  Pulmonary:     Effort: Pulmonary effort is normal. No respiratory distress.     Breath sounds: Normal breath sounds.  Musculoskeletal:        General: Normal range of motion.  Skin:    General: Skin is warm and dry.  Neurological:     General: No focal deficit present.     Mental Status: She is alert and oriented to person, place, and time. Mental status is at baseline.  Psychiatric:        Mood and Affect: Mood normal.        Behavior: Behavior normal.        Thought Content: Thought content normal.        Judgment: Judgment normal.    Lab Results  Component Value Date   HGBA1C 5.6 09/17/2023   HGBA1C 6.5 06/01/2023   HGBA1C 6.8 (A) 01/23/2023    Lab Results  Component Value Date   CREATININE 1.13 09/12/2023   CREATININE 1.08 06/01/2023   CREATININE 1.37 (H) 01/23/2023    Lab Results  Component Value Date   WBC 4.7 09/18/2022   HGB 11.0 (L) 09/18/2022   HCT 33.4 (L) 09/18/2022   PLT 164 09/18/2022   GLUCOSE 101 (H) 09/12/2023   CHOL 116 06/01/2023   TRIG 160.0 (H) 06/01/2023   HDL 36.40 (L) 06/01/2023   LDLDIRECT 50.0 06/01/2023   LDLCALC 47 06/01/2023   ALT 17 06/01/2023   AST 39 (H) 06/01/2023   NA 142 09/12/2023   K 4.5 09/12/2023   CL 106 09/12/2023   CREATININE 1.13 09/12/2023   BUN 26 (H) 09/12/2023   CO2 28 09/12/2023   TSH 1.74 01/23/2023   INR 1.1 06/06/2021   HGBA1C 5.6 09/17/2023   MICROALBUR 2.3 (H) 09/17/2023    No results found.  Assessment & Plan:  .Encounter for screening mammogram for malignant neoplasm of breast -     3D Screening Mammogram, Left and Right; Future  Controlled type 2 diabetes with neuropathy (HCC) Assessment & Plan: A1c is 6.5  and she has lost 14 lbs with Mounjaro .   she nas new onset microalbuminuria but she has a history of acute renal failure during trial of telmisartan . Paient is up-to-date on eye exams . continue asa and crestor.  She nis tolerating  mounjaro and requests increase in dose.   Lab Results  Component Value Date   HGBA1C 5.6 09/17/2023   Lab Results  Component Value Date   MICROALBUR 2.3 (H) 09/17/2023   Lab Results  Component Value Date   CREATININE 1.13 09/12/2023  Orders: -     POCT glycosylated hemoglobin (Hb A1C) -     Hemoglobin A1c; Future -     Comprehensive metabolic panel; Future -     Lipid Panel w/reflex Direct LDL; Future -     Microalbumin / creatinine urine ratio  Anemia due to chronic kidney disease, unspecified CKD stage Assessment & Plan: Chronic, normocytic,  normalB12/folate and iron studies.  Consider secondary to CKD.  Lab Results  Component Value Date   WBC 4.7 09/18/2022   HGB 11.0 (L) 09/18/2022   HCT 33.4 (L) 09/18/2022   MCV 88.4 09/18/2022   PLT 164 09/18/2022   Lab Results  Component Value Date   IRON 77 12/13/2021   TIBC 312.2 12/13/2021   FERRITIN 81.0 12/13/2021   Lab Results  Component Value Date   VITAMINB12 >1504 (H) 12/13/2021   Lab Results  Component Value Date   FOLATE >24.2 12/13/2021        Other orders -     Tirzepatide; Inject 5 mg into the skin once a week.  Dispense: 6 mL; Refill: 2     I provided 30 minutes of face-to-face time during this encounter reviewing patient's last visit with me, patient's  most recent visit with cardiology,  nephrology,  and neurology,  recent surgical and non surgical procedures, previous  labs and imaging studies, counseling on currently addressed issues,  and post visit ordering to diagnostics and therapeutics .   Follow-up: No follow-ups on file.   Sherlene Shams, MD

## 2023-09-17 NOTE — Patient Instructions (Addendum)
I HAVE INCREASED YOUR DOSE OF MOUNJARO TO 5 MG WEEKLY  IF YOU LOSE TOO MUCH WEIGHT , YOU CAN TAKE YOUR DOSE EVERY 10  DAYS INSTEAD OF EVERY 7    RETURN ON OR AFTER FEBRUARY 12 FOR LABS

## 2023-09-17 NOTE — Assessment & Plan Note (Signed)
Chronic, normocytic,  normalB12/folate and iron studies.  Consider secondary to CKD.  Lab Results  Component Value Date   WBC 4.7 09/18/2022   HGB 11.0 (L) 09/18/2022   HCT 33.4 (L) 09/18/2022   MCV 88.4 09/18/2022   PLT 164 09/18/2022   Lab Results  Component Value Date   IRON 77 12/13/2021   TIBC 312.2 12/13/2021   FERRITIN 81.0 12/13/2021   Lab Results  Component Value Date   VITAMINB12 >1504 (H) 12/13/2021   Lab Results  Component Value Date   FOLATE >24.2 12/13/2021

## 2023-09-17 NOTE — Assessment & Plan Note (Signed)
A1c is 6.5  and she has lost 14 lbs with Mounjaro .   she nas new onset microalbuminuria but she has a history of acute renal failure during trial of telmisartan . Paient is up-to-date on eye exams . continue asa and crestor.  She nis tolerating mounjaro and requests increase in dose.   Lab Results  Component Value Date   HGBA1C 5.6 09/17/2023   Lab Results  Component Value Date   MICROALBUR 2.3 (H) 09/17/2023   Lab Results  Component Value Date   CREATININE 1.13 09/12/2023

## 2023-09-18 ENCOUNTER — Telehealth: Payer: Self-pay | Admitting: Internal Medicine

## 2023-09-18 NOTE — Telephone Encounter (Signed)
Form has been received for completion.

## 2023-09-18 NOTE — Telephone Encounter (Signed)
Patient went to pick up her tirzepatide [facility]) 5 MG/0.5ML Pen yesterday. Pharmacy said it would be $600 plus. Patient called [facility]. They are going to fax to Dr Darrick Huntsman for a Tier exception and it will lower the cost. Patient says we should allready have the fax.

## 2023-09-19 NOTE — Telephone Encounter (Signed)
Patient called about her tirzepatide [facility]) 5 MG/0.5ML Pen. To see if there was a coupon for this medication.

## 2023-09-20 ENCOUNTER — Ambulatory Visit
Admission: RE | Admit: 2023-09-20 | Discharge: 2023-09-20 | Disposition: A | Discharge status: Home / Self Care | Payer: Medicare Other | Source: Ambulatory Visit | Attending: Internal Medicine | Admitting: Internal Medicine

## 2023-09-20 ENCOUNTER — Telehealth: Payer: Self-pay

## 2023-09-20 DIAGNOSIS — Z1231 Encounter for screening mammogram for malignant neoplasm of breast: Secondary | ICD-10-CM | POA: Insufficient documentation

## 2023-09-20 NOTE — Telephone Encounter (Signed)
PA for a tier reduction is needed for [facility]

## 2023-09-21 ENCOUNTER — Other Ambulatory Visit (HOSPITAL_COMMUNITY): Payer: Self-pay

## 2023-09-25 ENCOUNTER — Other Ambulatory Visit (HOSPITAL_COMMUNITY): Payer: Self-pay

## 2023-09-27 ENCOUNTER — Telehealth: Payer: Self-pay

## 2023-09-27 ENCOUNTER — Other Ambulatory Visit (HOSPITAL_COMMUNITY): Payer: Self-pay

## 2023-09-27 NOTE — Telephone Encounter (Signed)
Saw Dr. Darrick Huntsman last week and went up to

## 2023-09-27 NOTE — Telephone Encounter (Signed)
She is more than likely approved for 2 ml a month, which would be 4 pens, I've seen this a lot from insurance. After pt fills those 2 mls, they expect the pt to increase in dose, if we tried to fill the same dose two months in a row, we would receive a quantity limit exception explaining that the plan only covers 2ml of a certain strength per year. Hope this makes sense and helps!

## 2023-09-27 NOTE — Telephone Encounter (Signed)
Patient states her insurance approved her [name] for two doses a month, but Dr. Duncan Dull prescribed four doses a month.  Patient would like to know how she can get it approved for four per month.  Patient states she just spoke with someone at [facility], who told her that she will have to submit a coverage determination for four instead of two.

## 2023-09-27 NOTE — Telephone Encounter (Signed)
Pharmacy Patient Advocate Encounter  Insurance verification completed.    The patient is insured through  [employer]       This test claim was processed through [facility]- copay amounts may vary at other pharmacies due to pharmacy/plan contracts, or as the patient moves through the different stages of their insurance plan.

## 2023-10-01 ENCOUNTER — Other Ambulatory Visit (HOSPITAL_COMMUNITY): Payer: Self-pay

## 2023-10-01 NOTE — Telephone Encounter (Signed)
Medication Samples have been provided to the patient.  Drug name: [name]       Strength: 2.5 mg        Qty: 2 boxes  LOT: [id number]  Exp.Date: 05/01/2025  Dosing instructions: Inject 2.5 mg into skin once weekly.   The patient has been instructed regarding the correct time, dose, and frequency of taking this medication, including desired effects and most common side effects.   [name] 3:48 PM 10/01/2023

## 2023-10-01 NOTE — Telephone Encounter (Signed)
Pt stated that she will be by tomorrow to pick these up.

## 2023-10-01 NOTE — Telephone Encounter (Signed)
Nothing needs to be done on your end, the higher dose does go through successfully. Unfortunately, pt is in coverage gap so insurance wants to charge $661.02. The coverage gap will no longer exist next year, samples might be necessary for pt compliance.   If pt is unable to afford mounjaro and samples are not available, there is medication assistance available for Ozempic through [employer], please let me know if you and the pt are both agreeable with the switch, and I can apply for the pt.   Thanks Dr. Darrick Huntsman!

## 2023-10-24 ENCOUNTER — Encounter (INDEPENDENT_AMBULATORY_CARE_PROVIDER_SITE_OTHER): Payer: Self-pay

## 2023-11-27 ENCOUNTER — Encounter (INDEPENDENT_AMBULATORY_CARE_PROVIDER_SITE_OTHER): Payer: Medicare Other

## 2023-11-27 ENCOUNTER — Ambulatory Visit (INDEPENDENT_AMBULATORY_CARE_PROVIDER_SITE_OTHER): Payer: Medicare Other | Admitting: Vascular Surgery

## 2023-11-27 ENCOUNTER — Other Ambulatory Visit (INDEPENDENT_AMBULATORY_CARE_PROVIDER_SITE_OTHER): Payer: Medicare Other

## 2023-11-30 ENCOUNTER — Other Ambulatory Visit: Payer: Self-pay | Admitting: Internal Medicine

## 2023-11-30 DIAGNOSIS — I25810 Atherosclerosis of coronary artery bypass graft(s) without angina pectoris: Secondary | ICD-10-CM

## 2023-12-19 ENCOUNTER — Other Ambulatory Visit (INDEPENDENT_AMBULATORY_CARE_PROVIDER_SITE_OTHER): Payer: Medicare Other

## 2023-12-19 DIAGNOSIS — E1140 Type 2 diabetes mellitus with diabetic neuropathy, unspecified: Secondary | ICD-10-CM | POA: Diagnosis not present

## 2023-12-19 LAB — COMPREHENSIVE METABOLIC PANEL
ALT: 15 U/L (ref 0–35)
AST: 36 U/L (ref 0–37)
Albumin: 4.4 g/dL (ref 3.5–5.2)
Alkaline Phosphatase: 56 U/L (ref 39–117)
BUN: 34 mg/dL — ABNORMAL HIGH (ref 6–23)
CO2: 27 meq/L (ref 19–32)
Calcium: 9.3 mg/dL (ref 8.4–10.5)
Chloride: 106 meq/L (ref 96–112)
Creatinine, Ser: 1.17 mg/dL (ref 0.40–1.20)
GFR: 43.75 mL/min — ABNORMAL LOW (ref >60.00)
Glucose, Bld: 89 mg/dL (ref 70–99)
Potassium: 4.2 meq/L (ref 3.5–5.1)
Sodium: 141 meq/L (ref 135–145)
Total Bilirubin: 0.5 mg/dL (ref 0.2–1.2)
Total Protein: 6.6 g/dL (ref 6.0–8.3)

## 2023-12-19 LAB — HEMOGLOBIN A1C: Hgb A1c MFr Bld: 6.3 % (ref 4.6–6.5)

## 2023-12-20 ENCOUNTER — Encounter: Payer: Self-pay | Admitting: Internal Medicine

## 2023-12-20 LAB — LIPID PANEL W/REFLEX DIRECT LDL
Cholesterol: 126 mg/dL (ref <200)
HDL: 42 mg/dL — ABNORMAL LOW (ref >=50)
LDL Cholesterol (Calc): 61 mg/dL
Non-HDL Cholesterol (Calc): 84 mg/dL (ref <130)
Total CHOL/HDL Ratio: 3 (calc) (ref <5.0)
Triglycerides: 146 mg/dL (ref <150)

## 2023-12-24 ENCOUNTER — Other Ambulatory Visit (INDEPENDENT_AMBULATORY_CARE_PROVIDER_SITE_OTHER): Payer: Self-pay | Admitting: Nurse Practitioner

## 2023-12-24 DIAGNOSIS — I714 Abdominal aortic aneurysm, without rupture, unspecified: Secondary | ICD-10-CM

## 2023-12-24 DIAGNOSIS — I6523 Occlusion and stenosis of bilateral carotid arteries: Secondary | ICD-10-CM

## 2023-12-24 DIAGNOSIS — I739 Peripheral vascular disease, unspecified: Secondary | ICD-10-CM

## 2023-12-28 ENCOUNTER — Ambulatory Visit (INDEPENDENT_AMBULATORY_CARE_PROVIDER_SITE_OTHER): Payer: Medicare Other

## 2023-12-28 ENCOUNTER — Ambulatory Visit (INDEPENDENT_AMBULATORY_CARE_PROVIDER_SITE_OTHER): Payer: Medicare Other | Admitting: Vascular Surgery

## 2023-12-28 ENCOUNTER — Encounter (INDEPENDENT_AMBULATORY_CARE_PROVIDER_SITE_OTHER): Payer: Self-pay | Admitting: Vascular Surgery

## 2023-12-28 VITALS — BP 129/72 | HR 52 | Resp 18 | Ht 64.0 in | Wt 144.0 lb

## 2023-12-28 DIAGNOSIS — I6523 Occlusion and stenosis of bilateral carotid arteries: Secondary | ICD-10-CM | POA: Diagnosis not present

## 2023-12-28 DIAGNOSIS — I7143 Infrarenal abdominal aortic aneurysm, without rupture: Secondary | ICD-10-CM

## 2023-12-28 DIAGNOSIS — I714 Abdominal aortic aneurysm, without rupture, unspecified: Secondary | ICD-10-CM | POA: Diagnosis not present

## 2023-12-28 DIAGNOSIS — I739 Peripheral vascular disease, unspecified: Secondary | ICD-10-CM | POA: Diagnosis not present

## 2023-12-28 DIAGNOSIS — E1140 Type 2 diabetes mellitus with diabetic neuropathy, unspecified: Secondary | ICD-10-CM

## 2023-12-28 NOTE — Assessment & Plan Note (Signed)
 ABIs today are 1.07 on the right and 1.11 on the left with multiphasic waveforms and digit pressures of over 100.  Perfusion remains well-maintained.  Recheck in 1 year.

## 2023-12-28 NOTE — Assessment & Plan Note (Signed)
 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  Continue current medical regimen.  Continue to follow annually with duplex.

## 2023-12-28 NOTE — Progress Notes (Signed)
 MRN : [id number]  Sabrina Montoya is a 83 y.o. (Sep 29, 1941) female who presents with chief complaint of  Chief Complaint  Patient presents with   Follow-up     1 year recheck    .  History of Present Illness: Patient returns today in follow up of multiple vascular issues.  She is doing well today.  Her biggest issue continues to be her neuropathy which is stable.  No open wounds or infections of the lower extremities.  No lifestyle limiting claudication or symptoms of ischemic rest pain.  ABIs today are 1.07 on the right and 1.11 on the left with multiphasic waveforms and digit pressures of over 100. She is also followed for her abdominal aortic aneurysm.  We have monitored this for several years with duplex.  No current aneurysm related symptoms. Specifically, the patient denies new back or abdominal pain, or signs of peripheral embolization.  Aortic duplex today reveals stable 3.2 cm infrarenal abdominal aortic aneurysm. Finally, she is also followed for carotid disease.  She has a previous history of left carotid endarterectomy.  No focal neurologic symptoms. Specifically, the patient denies amaurosis fugax, speech or swallowing difficulties, or arm or leg weakness or numbness.  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  Current Outpatient Medications  Medication Sig Dispense Refill   amLODipine (NORVASC) 5 MG tablet Take 1 tablet (5 mg total) by mouth daily. 90 tablet 3   aspirin 81 MG tablet Take 81 mg by mouth daily.     cholecalciferol (VITAMIN D) 1000 UNITS tablet Take 1,000 Units by mouth daily.     isosorbide mononitrate (IMDUR) 60 MG 24 hr tablet Take 1 tablet (60 mg total) by mouth daily. 90 tablet 3   metoprolol succinate (TOPROL-XL) 50 MG 24 hr tablet Take 1 tablet (50 mg total) by mouth daily. Take with or immediately following a meal. 90 tablet 3   Multiple Vitamins-Minerals  (PRESERVISION AREDS 2 PO) Take by mouth 2 (two) times daily.     nitroGLYCERIN (NITROSTAT) 0.4 MG SL tablet DISSOLVE 1 TABLET UNDER THE TONGUE EVERY 5 MINUTES AS NEEDED FOR CHEST PAIN 50 tablet 4   omeprazole (PRILOSEC) 20 MG capsule TAKE 1 CAPSULE(20 MG) BY MOUTH DAILY 90 capsule 1   rosuvastatin (CRESTOR) 40 MG tablet TAKE 1 TABLET(40 MG) BY MOUTH DAILY 90 tablet 1   tirzepatide (MOUNJARO) 5 MG/0.5ML Pen Inject 5 mg into the skin once a week. 6 mL 2   tiZANidine (ZANAFLEX) 2 MG tablet TAKE 1 TABLET(2 MG) BY MOUTH AT BEDTIME 30 tablet 0   Turmeric (QC TUMERIC COMPLEX PO) Take 1 mg by mouth daily.     benzonatate (TESSALON) 200 MG capsule Take 1 capsule (200 mg total) by mouth 2 (two) times daily as needed for cough. (Patient not taking: Reported on 09/17/2023) 20 capsule 0   No current facility-administered medications for this visit.    Past Medical History:  Diagnosis Date   Abdominal aortic aneurysm without mention of rupture    infrarenal, stable, folllowed by Festus Barren   Acute posthemorrhagic anemia    Arthritis    B12 deficiency    CAD (coronary artery disease), autologous vein bypass graft    Cardiac dysrhythmia, unspecified    Colon polyp    Dental infection 01/17/2022   Depression    Diverticulitis of colon    Dizziness    DUE  TO MEDICINES   Dysrhythmia    GERD (gastroesophageal reflux disease)    HOH (hard of hearing)    Hyperlipidemia    Hypertension    pt denies. placed on meds after CABG   IBS (irritable bowel syndrome)    Neuropathy    Neuropathy 07/01/2013   Peripheral vascular disease (HCC)    s/p CEA    Reflux esophagitis    Rheumatic fever    possible at age 82   Sliding hiatal hernia    Tobacco abuse    Tobacco abuse     Past Surgical History:  Procedure Laterality Date   ABDOMINAL HYSTERECTOMY     ABDOMINAL SURGERY  1976   for pain secondary to scar tissue, s/p apply   APPENDECTOMY  1974   BACK SURGERY     CAROTID ENDARTERECTOMY     CATARACT  EXTRACTION W/PHACO Right 10/18/2016   Procedure: CATARACT EXTRACTION PHACO AND INTRAOCULAR LENS PLACEMENT (IOC);  Surgeon: Sallee Lange, MD;  Location: [facility] OR[facility];  Service: Ophthalmology;  Laterality: Right;  Lot # [id number] H [place]: 01:04.0 AP%: 24.4 CDE: 30.44   CATARACT EXTRACTION W/PHACO Left 03/14/2017   Procedure: CATARACT EXTRACTION PHACO AND INTRAOCULAR LENS PLACEMENT (IOC)  Left;  Surgeon: Lockie Mola, MD;  Location: [facility];  Service: Ophthalmology;  Laterality: Left;   CEA     Carotid stenosis found during workup for dysphagia,  [facility]   CERVICAL DISC SURGERY  2009   Dr. Elvis Coil for cervical cord stenosis, C5-6 diskectomy   CHOLECYSTECTOMY  2002   Dr. Michela Pitcher   CLOSED MANIPULATION SHOULDER     left shoulder post physical therapy, Right shoulder redo (toggle bolts)   COLONOSCOPY  09/01/14   coronary angiography  01/2011   one occluded artery with collateralization, other arteries patent   CORONARY ARTERY BYPASS GRAFT  2006   s/p triple bypass surgery, [facility]   heart catherization  2016   HERNIA REPAIR  may 2011   Dr. Michela Pitcher   hernia repair     LEFT HEART CATH AND CORONARY ANGIOGRAPHY N/A 06/07/2021   Procedure: LEFT HEART CATH AND CORONARY ANGIOGRAPHY;  Surgeon: Marcina Millard, MD;  Location: [facility];  Service: Cardiovascular;  Laterality: N/A;   LUMBAR DISC SURGERY  1971   L5, unssuccessful, fusion in 1983 successful (2 lumbar)   PARTIAL HYSTERECTOMY     ROTATOR CUFF REPAIR  2002   right shoulder Dr. Vernell Leep, [facility] in [facility]   VASCULAR SURGERY       Social History   Tobacco Use   Smoking status: Former    Current packs/day: 0.00    Average packs/day: 0.5 packs/day for 30.0 years (15.0 ttl pk-yrs)    Types: Cigarettes    Start date: 04/01/1982    Quit date: 04/01/2012    Years since quitting: 11.7   Smokeless tobacco: Never   Tobacco comments:    quit for 2 years after sinus infection and 6 months after heart  surgery  Vaping Use   Vaping status: Never Used  Substance Use Topics   Alcohol use: No   Drug use: No       Family History  Problem Relation Age of Onset   Diabetes Mother    Heart disease Mother    Other Father        suicide   Cancer Sister        cervical cancer   Diabetes Sister    Diabetes Sister    Cancer Sister  breast   Breast cancer Sister    Heart disease Brother        coronary artery disease   Other Brother        suicide   Other Other        colon resection due to inflammation -nephew   Bladder Cancer Neg Hx    Kidney cancer Neg Hx      Allergies  Allergen Reactions   Telmisartan Other (See Comments)    Acute renal failure Renal issues   Niacin Other (See Comments)   Niacin And Related Hives   Sertraline Hcl Other (See Comments)    Hallucinations,     Sulfa Antibiotics Other (See Comments)   Sulfa Drugs Cross Reactors Nausea Only   Tetracycline Other (See Comments)   Tetracyclines & Related Hives   Tizanidine Other (See Comments)    Issues with kidney function   Latex Rash and Other (See Comments)    RAST testing was NEGATIVE  for LATEX   Simvastatin Rash and Other (See Comments)    *Antihyperlipidemics*; elevated LFT's.      REVIEW OF SYSTEMS (Negative unless checked)  Constitutional: [] Weight loss  [] Fever  [] Chills Cardiac: [] Chest pain   [] Chest pressure   [x] Palpitations   [] Shortness of breath when laying flat   [] Shortness of breath at rest   [] Shortness of breath with exertion. Vascular:  [] Pain in legs with walking   [] Pain in legs at rest   [] Pain in legs when laying flat   [] Claudication   [] Pain in feet when walking  [] Pain in feet at rest  [] Pain in feet when laying flat   [] History of DVT   [] Phlebitis   [] Swelling in legs   [] Varicose veins   [] Non-healing ulcers Pulmonary:   [] Uses home oxygen   [] Productive cough   [] Hemoptysis   [] Wheeze  [] COPD   [] Asthma Neurologic:  [] Dizziness  [] Blackouts   [] Seizures   [] History  of stroke   [] History of TIA  [] Aphasia   [] Temporary blindness   [] Dysphagia   [] Weakness or numbness in arms   [x] Weakness or numbness in legs Musculoskeletal:  [x] Arthritis   [] Joint swelling   [x] Joint pain   [] Low back pain Hematologic:  [] Easy bruising  [] Easy bleeding   [] Hypercoagulable state   [] Anemic   Gastrointestinal:  [] Blood in stool   [] Vomiting blood  [x] Gastroesophageal reflux/heartburn   [] Abdominal pain Genitourinary:  [] Chronic kidney disease   [] Difficult urination  [] Frequent urination  [] Burning with urination   [] Hematuria Skin:  [] Rashes   [] Ulcers   [] Wounds Psychological:  [] History of anxiety   []  History of major depression.  Physical Examination  BP 129/72   Pulse (!) 52   Resp 18   Ht 5\' 4"  (1.626 m)   Wt 144 lb (65.3 kg)   BMI 24.72 kg/m  Gen:  WD/WN, NAD.  Appears younger than stated age Head: [place]/AT, No temporalis wasting. Ear/Nose/Throat: Hearing grossly intact, nares w/o erythema or drainage Eyes: Conjunctiva clear. Sclera non-icteric Neck: Supple.  Trachea midline Pulmonary:  Good air movement, no use of accessory muscles.  Cardiac: Bradycardic Vascular:  Vessel Right Left  Radial Palpable Palpable                          PT Palpable Palpable  DP Palpable Palpable   Gastrointestinal: soft, non-tender/non-distended. No guarding/reflex.  Musculoskeletal: M/S 5/5 throughout.  No deformity or atrophy.  No edema. Neurologic: Sensation grossly intact in  extremities.  Symmetrical.  Speech is fluent.  Psychiatric: Judgment intact, Mood & affect appropriate for pt's clinical situation. Dermatologic: No rashes or ulcers noted.  No cellulitis or open wounds.      Labs Recent Results (from the past 2160 hours)  Lipid Panel w/reflex Direct LDL     Status: Abnormal   Collection Time: 12/19/23  9:21 AM  Result Value Ref Range   Cholesterol 126 <200 mg/dL   HDL 42 (L) > OR = 50 mg/dL   Triglycerides 578 <469 mg/dL   LDL Cholesterol (Calc) 61  mg/dL (calc)    Comment: Reference range: <100 . Desirable range <100 mg/dL for primary prevention;   <70 mg/dL for patients with CHD or diabetic patients  with > or = 2 CHD risk factors. Marland Kitchen LDL-C is now calculated using the Martin-Hopkins  calculation, which is a validated novel method providing  better accuracy than the Friedewald equation in the  estimation of LDL-C.  Horald Pollen et al. Lenox Ahr. 6295;284(13): 2061-2068  (http://education.[facility].com/faq/FAQ164)    Total CHOL/HDL Ratio 3.0 <5.0 (calc)   Non-HDL Cholesterol (Calc) 84 <244 mg/dL (calc)    Comment: For patients with diabetes plus 1 major ASCVD risk  factor, treating to a non-HDL-C goal of <100 mg/dL  (LDL-C of <01 mg/dL) is considered a therapeutic  option.   Comprehensive metabolic panel     Status: Abnormal   Collection Time: 12/19/23  9:21 AM  Result Value Ref Range   Sodium 141 135 - 145 mEq/L   Potassium 4.2 3.5 - 5.1 mEq/L   Chloride 106 96 - 112 mEq/L   CO2 27 19 - 32 mEq/L   Glucose, Bld 89 70 - 99 mg/dL   BUN 34 (H) 6 - 23 mg/dL   Creatinine, Ser 0.27 0.40 - 1.20 mg/dL   Total Bilirubin 0.5 0.2 - 1.2 mg/dL   Alkaline Phosphatase 56 39 - 117 U/L   AST 36 0 - 37 U/L   ALT 15 0 - 35 U/L   Total Protein 6.6 6.0 - 8.3 g/dL   Albumin 4.4 3.5 - 5.2 g/dL   GFR 25.36 (L) >64.40 mL/min    Comment: Calculated using the CKD-EPI Creatinine Equation (2021)   Calcium 9.3 8.4 - 10.5 mg/dL  Hemoglobin H4V     Status: None   Collection Time: 12/19/23  9:21 AM  Result Value Ref Range   Hgb A1c MFr Bld 6.3 4.6 - 6.5 %    Comment: Glycemic Control Guidelines for People with Diabetes:Non Diabetic:  <6%Goal of Therapy: <7%Additional Action Suggested:  >8%     Radiology No results found.  Assessment/Plan  Controlled type 2 diabetes with neuropathy (HCC) blood glucose control important in reducing the progression of atherosclerotic disease. Also, involved in wound healing. On appropriate  medications.   Peripheral vascular disease (HCC) ABIs today are 1.07 on the right and 1.11 on the left with multiphasic waveforms and digit pressures of over 100.  Perfusion remains well-maintained.  Recheck in 1 year.  AAA (abdominal aortic aneurysm) without rupture (HCC) Aortic duplex today reveals stable 3.2 cm infrarenal abdominal aortic aneurysm.  This has been relatively stable.  Continue to monitor this on an annual basis with duplex.  Bilateral carotid artery stenosis Carotid duplex today demonstrates stable 40 to 59% left ICA stenosis with a patent left carotid endarterectomy with velocities in the 1 to 39% range.  Continue current medical regimen.  Continue to follow annually with duplex.    Festus Barren, MD  12/28/2023 11:41 AM    This note was created with Dragon medical transcription system.  Any errors from dictation are purely unintentional

## 2023-12-28 NOTE — Assessment & Plan Note (Signed)
 blood glucose control important in reducing the progression of atherosclerotic disease. Also, involved in wound healing. On appropriate medications.

## 2023-12-28 NOTE — Assessment & Plan Note (Signed)
 Aortic duplex today reveals stable 3.2 cm infrarenal abdominal aortic aneurysm.  This has been relatively stable.  Continue to monitor this on an annual basis with duplex.

## 2023-12-31 LAB — VAS US ABI WITH/WO TBI
Left ABI: 1.11
Right ABI: 1.07

## 2024-01-22 ENCOUNTER — Encounter: Payer: Self-pay | Admitting: Internal Medicine

## 2024-01-28 NOTE — Telephone Encounter (Signed)
 Spoke with pt on the phone and she stated that she would like to schedule an appointment to discuss this with Dr. Darrick Huntsman in person. Appointment has been scheduled and pt is aware.

## 2024-02-06 ENCOUNTER — Telehealth: Payer: Self-pay

## 2024-02-06 ENCOUNTER — Ambulatory Visit (INDEPENDENT_AMBULATORY_CARE_PROVIDER_SITE_OTHER): Admitting: Internal Medicine

## 2024-02-06 ENCOUNTER — Other Ambulatory Visit: Payer: Self-pay | Admitting: Cardiovascular Disease

## 2024-02-06 VITALS — BP 130/70 | HR 60 | Temp 98.0°F | Resp 16 | Ht 64.0 in | Wt 143.0 lb

## 2024-02-06 DIAGNOSIS — N1831 Chronic kidney disease, stage 3a: Secondary | ICD-10-CM | POA: Diagnosis not present

## 2024-02-06 DIAGNOSIS — N183 Chronic kidney disease, stage 3 unspecified: Secondary | ICD-10-CM

## 2024-02-06 DIAGNOSIS — T733XXA Exhaustion due to excessive exertion, initial encounter: Secondary | ICD-10-CM

## 2024-02-06 DIAGNOSIS — R8290 Unspecified abnormal findings in urine: Secondary | ICD-10-CM | POA: Diagnosis not present

## 2024-02-06 DIAGNOSIS — I130 Hypertensive heart and chronic kidney disease with heart failure and stage 1 through stage 4 chronic kidney disease, or unspecified chronic kidney disease: Secondary | ICD-10-CM | POA: Diagnosis not present

## 2024-02-06 DIAGNOSIS — R001 Bradycardia, unspecified: Secondary | ICD-10-CM

## 2024-02-06 DIAGNOSIS — Z7985 Long-term (current) use of injectable non-insulin antidiabetic drugs: Secondary | ICD-10-CM

## 2024-02-06 DIAGNOSIS — Z7984 Long term (current) use of oral hypoglycemic drugs: Secondary | ICD-10-CM

## 2024-02-06 DIAGNOSIS — I951 Orthostatic hypotension: Secondary | ICD-10-CM | POA: Diagnosis not present

## 2024-02-06 DIAGNOSIS — I5030 Unspecified diastolic (congestive) heart failure: Secondary | ICD-10-CM | POA: Diagnosis not present

## 2024-02-06 DIAGNOSIS — E1121 Type 2 diabetes mellitus with diabetic nephropathy: Secondary | ICD-10-CM

## 2024-02-06 MED ORDER — METOPROLOL SUCCINATE ER 50 MG PO TB24: 50.0000 mg | ORAL_TABLET | Freq: Every day | ORAL | 3 refills | Status: AC

## 2024-02-06 MED ORDER — OMEPRAZOLE 20 MG PO CPDR: DELAYED_RELEASE_CAPSULE | ORAL | 1 refills | Status: DC

## 2024-02-06 MED ORDER — AMLODIPINE BESYLATE 5 MG PO TABS: 5.0000 mg | ORAL_TABLET | Freq: Every day | ORAL | 3 refills | Status: AC

## 2024-02-06 NOTE — Assessment & Plan Note (Signed)
 She had acute renal failure during a trial of Farxiga in July 2022

## 2024-02-06 NOTE — Patient Instructions (Signed)
 Your diabetes remains under excellent control     I am making a referral to our clinical pharmacist to see if we can get [place] and mounjaro covered better by your insurance

## 2024-02-06 NOTE — Telephone Encounter (Signed)
Pt scheduled on 4/25

## 2024-02-06 NOTE — Telephone Encounter (Signed)
 Good Morning,  Could you please schedule this patient a 12 month follow up visit? The patient was last seen on 01-09-2023 by Dr. Mariah Milling. Thank you so much.

## 2024-02-06 NOTE — Progress Notes (Unsigned)
 Subjective:  Patient ID: Sabrina Montoya, female    DOB: 1941-08-22  Age: 83 y.o. MRN: [id number]  CC: There were no encounter diagnoses.   HPI Sabrina Montoya presents for  Chief Complaint  Patient presents with   Medical Management of Chronic Issues   1) Type 2 DM .  She is taking mounjaro 2.5 mg less than weekly.  .  She has known  CAD and GFR < 45 by most recent CMET (Feb 2025, although [facility] nephrology has opined that she does NOT have CKD).  Previous trial of ARB resulted in hyperkalemia/ARF  and SGLT 2 inhibitor trial was never started per review of  July 2022 notes  She is also having trouble affording mounjaro  has not refilled. Lost weight on it,  wants to stay on it. The cost is $600/month.   . She notes numbness of feet and occasionally a burning pain    2)  She has noted episodes of  oiliness to her urine,  it separates from the toilet water and  the toilet paper clings to the urine I the commode.  She uses Equal flushable wipes occasionally, has not noted any association with use of these. Does not use vaginal estrogen   2) wakes up tired every morning despite sleeping > 8 hours .  Falls asleep in front of the TV for up to 3 hours,  then goes to bed and has trouble getting back to sleep for a while.    3) nocturnal leg cramps.  Water intake is minimal .  Drinks tea all day long.   4) feet are numb.  Diabetic neuropathy,  occasional stinging .   Trial of gabapentin not tolerated.  'had shingrix x 2 at [facility]  last year.      Outpatient Medications Prior to Visit  Medication Sig Dispense Refill   amLODipine (NORVASC) 5 MG tablet Take 1 tablet (5 mg total) by mouth daily. 90 tablet 3   aspirin 81 MG tablet Take 81 mg by mouth daily.     cholecalciferol (VITAMIN D) 1000 UNITS tablet Take 1,000 Units by mouth daily.     isosorbide mononitrate (IMDUR) 60 MG 24 hr tablet Take 1 tablet (60 mg total) by mouth daily. 90 tablet 3   metoprolol succinate (TOPROL-XL) 50 MG 24 hr tablet  Take 1 tablet (50 mg total) by mouth daily. Take with or immediately following a meal. 90 tablet 3   Multiple Vitamins-Minerals (PRESERVISION AREDS 2 PO) Take by mouth 2 (two) times daily.     nitroGLYCERIN (NITROSTAT) 0.4 MG SL tablet DISSOLVE 1 TABLET UNDER THE TONGUE EVERY 5 MINUTES AS NEEDED FOR CHEST PAIN 50 tablet 4   omeprazole (PRILOSEC) 20 MG capsule TAKE 1 CAPSULE(20 MG) BY MOUTH DAILY 90 capsule 1   rosuvastatin (CRESTOR) 40 MG tablet TAKE 1 TABLET(40 MG) BY MOUTH DAILY 90 tablet 1   tirzepatide (MOUNJARO) 5 MG/0.5ML Pen Inject 5 mg into the skin once a week. 6 mL 2   tiZANidine (ZANAFLEX) 2 MG tablet TAKE 1 TABLET(2 MG) BY MOUTH AT BEDTIME 30 tablet 0   Turmeric (QC TUMERIC COMPLEX PO) Take 1 mg by mouth daily.     benzonatate (TESSALON) 200 MG capsule Take 1 capsule (200 mg total) by mouth 2 (two) times daily as needed for cough. (Patient not taking: Reported on 02/06/2024) 20 capsule 0   No facility-administered medications prior to visit.    Review of Systems;  Patient denies headache, fevers, malaise,  unintentional weight loss, skin rash, eye pain, sinus congestion and sinus pain, sore throat, dysphagia,  hemoptysis , cough, dyspnea, wheezing, chest pain, palpitations, orthopnea, edema, abdominal pain, nausea, melena, diarrhea, constipation, flank pain, dysuria, hematuria, urinary  Frequency, nocturia, numbness, tingling, seizures,  Focal weakness, Loss of consciousness,  Tremor, insomnia, depression, anxiety, and suicidal ideation.      Objective:  BP 130/70   Pulse 60   Temp 98 F (36.7 C)   Resp 16   Ht 5\' 4"  (1.626 m)   Wt 143 lb (64.9 kg)   SpO2 98%   BMI 24.55 kg/m   BP Readings from Last 3 Encounters:  02/06/24 130/70  12/28/23 129/72  09/17/23 128/64    Wt Readings from Last 3 Encounters:  02/06/24 143 lb (64.9 kg)  12/28/23 144 lb (65.3 kg)  09/17/23 141 lb 6.4 oz (64.1 kg)    Physical Exam  Lab Results  Component Value Date   HGBA1C 6.3  12/19/2023   HGBA1C 5.6 09/17/2023   HGBA1C 6.5 06/01/2023    Lab Results  Component Value Date   CREATININE 1.17 12/19/2023   CREATININE 1.13 09/12/2023   CREATININE 1.08 06/01/2023    Lab Results  Component Value Date   WBC 4.7 09/18/2022   HGB 11.0 (L) 09/18/2022   HCT 33.4 (L) 09/18/2022   PLT 164 09/18/2022   GLUCOSE 89 12/19/2023   CHOL 126 12/19/2023   TRIG 146 12/19/2023   HDL 42 (L) 12/19/2023   LDLDIRECT 50.0 06/01/2023   LDLCALC 61 12/19/2023   ALT 15 12/19/2023   AST 36 12/19/2023   NA 141 12/19/2023   K 4.2 12/19/2023   CL 106 12/19/2023   CREATININE 1.17 12/19/2023   BUN 34 (H) 12/19/2023   CO2 27 12/19/2023   TSH 1.74 01/23/2023   INR 1.1 06/06/2021   HGBA1C 6.3 12/19/2023   MICROALBUR 2.3 (H) 09/17/2023    MM 3D SCREENING MAMMOGRAM BILATERAL BREAST Result Date: 09/21/2023 CLINICAL DATA:  Screening. EXAM: DIGITAL SCREENING BILATERAL MAMMOGRAM WITH TOMOSYNTHESIS AND CAD TECHNIQUE: Bilateral screening digital craniocaudal and mediolateral oblique mammograms were obtained. Bilateral screening digital breast tomosynthesis was performed. The images were evaluated with computer-aided detection. COMPARISON:  Previous exam(s). ACR Breast Density Category b: There are scattered areas of fibroglandular density. FINDINGS: There are no findings suspicious for malignancy. IMPRESSION: No mammographic evidence of malignancy. A result letter of this screening mammogram will be mailed directly to the patient. RECOMMENDATION: Screening mammogram in one year. (Code:SM-B-01Y) BI-RADS CATEGORY  1: Negative. Electronically Signed   By: Frederico Hamman M.D.   On: 09/21/2023 17:02    Assessment & Plan:  .There are no diagnoses linked to this encounter.   I spent 34 minutes on the day of this face to face encounter reviewing patient's  most recent visit with cardiology,  nephrology,  and neurology,  prior relevant surgical and non surgical procedures, recent  labs and imaging  studies, counseling on weight management,  reviewing the assessment and plan with patient, and post visit ordering and reviewing of  diagnostics and therapeutics with patient  .   Follow-up: No follow-ups on file.   Sherlene Shams, MD

## 2024-02-06 NOTE — Telephone Encounter (Signed)
 Medication Samples have been provided to the patient.  Drug name: [name]       Strength: 2.5 mg        Qty: 1 box  LOT: [id number]  Exp.Date: 07/01/2025  Dosing instructions: Inject 2.5 mg into skin once weekly.  The patient has been instructed regarding the correct time, dose, and frequency of taking this medication, including desired effects and most common side effects.   Demetric Parslow 1:48 PM 02/06/2024

## 2024-02-07 ENCOUNTER — Encounter: Payer: Self-pay | Admitting: Internal Medicine

## 2024-02-07 DIAGNOSIS — R8290 Unspecified abnormal findings in urine: Secondary | ICD-10-CM | POA: Insufficient documentation

## 2024-02-07 LAB — URINALYSIS, ROUTINE W REFLEX MICROSCOPIC
Bilirubin Urine: NEGATIVE
Hgb urine dipstick: NEGATIVE
Ketones, ur: NEGATIVE
Leukocytes,Ua: NEGATIVE
Nitrite: NEGATIVE
RBC / HPF: NONE SEEN (ref 0–?)
Specific Gravity, Urine: 1.01 (ref 1.000–1.030)
Total Protein, Urine: NEGATIVE
Urine Glucose: NEGATIVE
Urobilinogen, UA: 0.2 (ref 0.0–1.0)
WBC, UA: NONE SEEN (ref 0–?)
pH: 6 (ref 5.0–8.0)

## 2024-02-07 LAB — MICROALBUMIN / CREATININE URINE RATIO
Creatinine,U: 28 mg/dL
Microalb Creat Ratio: 204.5 mg/g — ABNORMAL HIGH (ref 0.0–30.0)
Microalb, Ur: 5.7 mg/dL — ABNORMAL HIGH (ref 0.0–1.9)

## 2024-02-07 NOTE — Assessment & Plan Note (Signed)
"  Oiliness" reported by patient interittmently may be due to use of aloe vera wipes onurogenital ara.  UA today is normal except for proteinuria

## 2024-02-11 ENCOUNTER — Telehealth: Payer: Self-pay

## 2024-02-11 NOTE — Progress Notes (Signed)
 Care Guide Pharmacy Note  02/11/2024 Name: [name] MRN: [id number] DOB: 1940-12-12  Referred By: Sherlene Shams, MD Reason for referral: Complex Care Management (Outreach to schedule with Pharm d )   Sabrina Montoya is a 83 y.o. year old female who is a primary care patient of Sherlene Shams, MD.  [name] was referred to the pharmacist for assistance related to: DMII  Successful contact was made with the patient to discuss pharmacy services including being ready for the pharmacist to call at least 5 minutes before the scheduled appointment time and to have medication bottles and any blood pressure readings ready for review. The patient agreed to meet with the pharmacist via telephone visit on (date/time).02/12/2024  Penne Lash , RMA     [place]  [facility], [facility] Guide  Direct Dial: [phone]  Website: [email or web address]

## 2024-02-12 ENCOUNTER — Other Ambulatory Visit (INDEPENDENT_AMBULATORY_CARE_PROVIDER_SITE_OTHER)

## 2024-02-12 DIAGNOSIS — E1121 Type 2 diabetes mellitus with diabetic nephropathy: Secondary | ICD-10-CM

## 2024-02-12 NOTE — Progress Notes (Signed)
   02/12/2024 Name: Sabrina Montoya MRN: [id number] DOB: 05-Sep-1941  Subjective  Chief Complaint  Patient presents with   Diabetes   Chronic Kidney Disease   Medication Access   Care Team: Primary Care Provider: Sherlene Shams, MD  Reason for visit: ?  Sabrina Montoya is a 83 y.o. female who presents today for a telephone visit with the pharmacist due to medication access concerns regarding their diabetes medications. ?  HPI: ?  Type 2 DM, CAD, CKD , UACR > 30, intolerant of ARB due to hyperkalemia, willing to try [place] and continue mounjaro but both are Tier 3. Any assistance available to reduce cost?   Med Access Prescription drug coverage: YES Rolene Arbour) [place] Enhanced Medicare PDP Value Script (Express scripts)   Current Patient Assistance:  None Mounjaro $1100. Reports cost was down to $19 dollars last year before going into the donut hole. Denies change in insurance company. Unsure of change in 2025 benefits.  Value Script online is showing: $590 deductible (on Tier 3-4) then 25% of brand cost thereafter. Test claim showing $590 deductible then $147/month coinsurance on Jardiance 10 mg (#30)   Patient lives in a household of 1 with an estimated combined annual income of ~$50k  via Social Security Disability and late husband's pension Printmaker).  Medicare LIS Eligible: No   Diabetes Current Medications:  Mounjaro 2.5 mg weekly - biweekly (has been stretching supply due to cost)  Tolerating Mounjaro 2.5 mg well without adverse effects. Reports 3 pens remaining at home.    Assessment and Plan:   1. Medication Access SGLT2i Advised patient to confirm with her insurance the cost of Jardiance/Farxiga AFTER $590 deductible is met. Test claim showing $150/month. [id number] Will plan for Novo Nordisk PAP for Ozempic if [place] cost remains unaffordable.  Patient plans to complete Mounjaro 2.5 mg supply (3 weeks left), then may start Ozempic 0.25 mg (via sample if available in  clinic) Patient to be scheduled on date of first Ozempic injection for training per her preference. She will call myself or clinic prior to giving her last Mounjaro dose to set up training visit with nurse or pharmD  Future Appointments  Date Time Provider Department Center  02/29/2024  9:20 AM Antonieta Iba, MD [facility] None  05/20/2024  1:00 PM Sherlene Shams, MD [facility]  06/10/2024  1:00 PM [facility] ANNUAL WELLNESS VISIT [facility]  12/26/2024  9:00 AM [facility] 1 [facility] None  12/26/2024  9:30 AM [facility] 1 [facility] None  12/26/2024 10:30 AM [facility] 1 [facility] None  12/26/2024 11:00 AM Dew, Marlow Baars, MD [facility] None   Loree Fee, PharmD Clinical Pharmacist [facility] [phone]

## 2024-02-12 NOTE — Patient Instructions (Signed)
 Ms. Sabrina Montoya,   It was a pleasure to speak with you today! As we discussed:?   Continue Mounjaro 2.5 mg once every week.   Before your last injection, please reach out to myself or to Tullo's nurse to request sample and to set up an office visit for training on the Ozempic pen/supervision of your first dose.  Ozempic has shown kidney protection benefits.  Ozempic also has an indication for reducing the risk of further heart events in patients with coronary artery disease (such as history of heart attack, stroke, plaque in the arteries, etc.).   Novo Nordisk Patient Assistance Foundation [facility]) Medication: Ozempic Phone: [phone] M-F 8am-8pm ET   We can apply online for this program. There is nothing specific you need to do. I recommend we apply between now and the time you receive your first Ozempic sample from [place] in clinic.   Once approved, they would send you free Ozempic for the rest of the year.   I recommend calling the phone number on the back of your Value Script prescription plan and confirming: - How much will a 30 day supply of Jardiance or [place] cost AFTER $590 deductible has been met  Consider speaking with a Medicare expert (called a Data processing manager)  DHHS provides access to all Medicare patient to a Medicare specialist Engineer, production Information Program [facility]) Coordinator). This is a FREE and non-biased service for all [place] citizens. I recommend speaking with them with a list of your medications as they can provide some insight into different Medicare plans based on your needs.  [email or web address]  Presenter, broadcasting Information Program [facility]) Coordinator Toma [employer] [facility] [address]     [place]  [address] [phone] (Ask for [facility] help)  I will see if anyone on the team is aware of any [facility] resources that may help people apply for/review benefits for  [employer] plans.   You may respond directly to this message, or leave me a voicemail at [phone] and I will get back to you shortly.   Please reach out prior to your next scheduled appointment should you have any questions or concerns.  Thank you!   Future Appointments  Date Time Provider Department Center  02/29/2024  9:20 AM Antonieta Iba, MD [facility] None  05/20/2024  1:00 PM Sherlene Shams, MD [facility]  06/10/2024  1:00 PM [facility] ANNUAL WELLNESS VISIT [facility]  12/26/2024  9:00 AM [facility] VASC 1 [facility] None  12/26/2024  9:30 AM [facility] VASC 1 [facility] None  12/26/2024 10:30 AM [facility] VASC 1 [facility] None  12/26/2024 11:00 AM Dew, Marlow Baars, MD [facility] None    Loree Fee, PharmD Clinical Pharmacist [facility] [phone]

## 2024-02-27 NOTE — Progress Notes (Unsigned)
 Cardiology Office Note  Date:  02/29/2024   ID:  Sabrina Montoya, DOB Oct 03, 1941, MRN [id number]  PCP:  Sabrina Flax, MD   Chief Complaint  Patient presents with   12 month follow up     Patient c/o fatigue/tiredness most days.     HPI:  Ms. Sabrina Montoya is a 83 year old woman with past medical history of Coronary disease CABG,  LIMA to LAD, SVG to L PDA and OM1 2006 Patent LIMA to LAD, SVG to L PDA, occluded SVG to OM1 06/07/2021 COPD / quit tobacco abuse 2013 PAD, Status post carotid endarterectomy Chronic Montoya disease Who presents for follow-up of her coronary disease and continued chest pain  Last seen in clinic March 2024 Active, maintains her garden and house Does not have much help at home, husband passed away several years ago  Reports she is troubled by frequent leg cramps at night 8 Normal ABIs, AAA 3.2 cm Carotid 40 to 59% on the right  Feels tired, poor sleep Falls asleep in front of the TV late at night, gets up at 1 AM, goes to bed, tosses and turns  Denies chest pain concerning for angina No shortness of breath on walking Blood pressure stable  Lab work reviewed A1c 6.3 Total cholesterol 126 LDL 61  Carotid ultrasound less than 39% disease bilaterally  EKG personally reviewed by myself on todays visit EKG Interpretation Date/Time:  Friday February 29 2024 09:12:08 EDT Ventricular Rate:  54 PR Interval:  182 QRS Duration:  86 QT Interval:  418 QTC Calculation: 396 R Axis:   39  Text Interpretation: Sinus bradycardia When compared with ECG of 18-Sep-2022 17:14, No significant change was found Confirmed by Sabrina Montoya [phone]) on 02/29/2024 9:20:59 AM   Other past medical history reviewed Previously followed by Sabrina Montoya, last seen December 2022  Prior cardiac history reviewed with her in detail hospitalized for non-ST elevation myocardial infarction August 2022  cardiac catheterization 06/07/2021 which revealed severe three-vessel coronary artery disease  with occluded ostial LAD, occluded left circumflex, occluded nondominant RCA, with patent LIMA to distal LAD, patent SVG to left PDA with retrograde flow to proximal left circumflex and OM1, and occluded SVG to OM1. Patient treated medically.   LVEF greater than 55%.     PMH:   has a past medical history of Abdominal aortic aneurysm without mention of rupture, Acute posthemorrhagic anemia, Arthritis, B12 deficiency, CAD (coronary artery disease), autologous vein bypass graft, Cardiac dysrhythmia, unspecified, Colon polyp, Dental infection (01/17/2022), Depression, Diverticulitis of colon, Dizziness, Dysrhythmia, GERD (gastroesophageal reflux disease), HOH (hard of hearing), Hyperlipidemia, Hypertension, IBS (irritable bowel syndrome), Neuropathy, Neuropathy (07/01/2013), Peripheral vascular disease (HCC), Reflux esophagitis, Rheumatic fever, Sliding hiatal hernia, Tobacco abuse, and Tobacco abuse.  PSH:    Past Surgical History:  Procedure Laterality Date   ABDOMINAL HYSTERECTOMY     ABDOMINAL SURGERY  1976   for pain secondary to scar tissue, s/p apply   APPENDECTOMY  1974   BACK SURGERY     CAROTID ENDARTERECTOMY     CATARACT EXTRACTION W/PHACO Right 10/18/2016   Procedure: CATARACT EXTRACTION PHACO AND INTRAOCULAR LENS PLACEMENT (IOC);  Surgeon: Sabrina Dingeldein, MD;  Location: [facility] ORS;  Service: Ophthalmology;  Laterality: Right;  Lot # [id number] H US : 01:04.0 AP%: 24.4 CDE: 30.44   CATARACT EXTRACTION W/PHACO Left 03/14/2017   Procedure: CATARACT EXTRACTION PHACO AND INTRAOCULAR LENS PLACEMENT (IOC)  Left;  Surgeon: Sabrina Kidney, MD;  Location: [facility];  Service: Ophthalmology;  Laterality: Left;  CEA     Carotid stenosis found during workup for dysphagia,  [facility]   CERVICAL DISC SURGERY  2009   Dr. Agustin Montoya for cervical cord stenosis, C5-6 diskectomy   CHOLECYSTECTOMY  2002   Dr. Amalia Montoya   CLOSED MANIPULATION SHOULDER     left shoulder post physical therapy, Right  shoulder redo (toggle bolts)   COLONOSCOPY  09/01/14   coronary angiography  01/2011   one occluded artery with collateralization, other arteries patent   CORONARY ARTERY BYPASS GRAFT  2006   s/p triple bypass surgery, [facility]   heart catherization  2016   HERNIA REPAIR  may 2011   Dr. Amalia Montoya   hernia repair     LEFT HEART CATH AND CORONARY ANGIOGRAPHY N/A 06/07/2021   Procedure: LEFT HEART CATH AND CORONARY ANGIOGRAPHY;  Surgeon: Sabrina Brace, MD;  Location: [facility] INVASIVE CV LAB;  Service: Cardiovascular;  Laterality: N/A;   LUMBAR DISC SURGERY  1971   L5, unssuccessful, fusion in 1983 successful (2 lumbar)   PARTIAL HYSTERECTOMY     ROTATOR CUFF REPAIR  2002   right shoulder Dr. Tylene Montoya, [facility] in [facility]   VASCULAR SURGERY      Current Outpatient Medications  Medication Sig Dispense Refill   amLODipine  (NORVASC ) 5 MG tablet Take 1 tablet (5 mg total) by mouth daily. 90 tablet 3   aspirin  81 MG tablet Take 81 mg by mouth daily.     cholecalciferol (VITAMIN D ) 1000 UNITS tablet Take 1,000 Units by mouth daily.     empagliflozin (JARDIANCE) 10 MG TABS tablet Take 10 mg by mouth daily.     isosorbide  mononitrate (IMDUR ) 60 MG 24 hr tablet Take 1 tablet (60 mg total) by mouth daily. 90 tablet 3   metoprolol  succinate (TOPROL -XL) 50 MG 24 hr tablet Take 1 tablet (50 mg total) by mouth daily. Take with or immediately following a meal. 90 tablet 3   Multiple Vitamins-Minerals (PRESERVISION AREDS 2 PO) Take by mouth 2 (two) times daily.     nitroGLYCERIN  (NITROSTAT ) 0.4 MG SL tablet DISSOLVE 1 TABLET UNDER THE TONGUE EVERY 5 MINUTES AS NEEDED FOR CHEST PAIN 50 tablet 4   omeprazole  (PRILOSEC) 20 MG capsule TAKE 1 CAPSULE(20 MG) BY MOUTH DAILY 90 capsule 1   rosuvastatin  (CRESTOR ) 40 MG tablet TAKE 1 TABLET(40 MG) BY MOUTH DAILY 90 tablet 1   tirzepatide  (MOUNJARO ) 5 MG/0.5ML Pen Inject 5 mg into the skin once a week. (Patient taking differently: Inject 5 mg into the  skin once a week. Uses every 3 weeks) 6 mL 2   tiZANidine  (ZANAFLEX ) 2 MG tablet TAKE 1 TABLET(2 MG) BY MOUTH AT BEDTIME 30 tablet 0   Turmeric (QC TUMERIC COMPLEX PO) Take 1 mg by mouth daily.     No current facility-administered medications for this visit.    Allergies:   Farxiga  [dapagliflozin ], Telmisartan , Niacin, Niacin and related, Sertraline  hcl, Sulfa antibiotics, Sulfa drugs cross reactors, Tetracycline, Tetracyclines & related, Tizanidine , Latex, and Simvastatin   Social History:  The patient  reports that she quit smoking about 11 years ago. Her smoking use included cigarettes. She started smoking about 41 years ago. She has a 15 pack-year smoking history. She has never used smokeless tobacco. She reports that she does not drink alcohol and does not use drugs.   Family History:   family history includes Breast cancer in her sister; Cancer in her sister and sister; Diabetes in her mother, sister, and sister; Heart disease in her brother  and mother; Other in her brother, father, and another family member.   Review of Systems: Review of Systems  Constitutional: Negative.   HENT: Negative.    Respiratory: Negative.    Cardiovascular:  Positive for chest pain.  Gastrointestinal: Negative.   Musculoskeletal:  Positive for back pain.  Neurological: Negative.   Psychiatric/Behavioral: Negative.    All other systems reviewed and are negative.  PHYSICAL EXAM: VS:  BP 122/62 (BP Location: Left Arm, Patient Position: Sitting, Cuff Size: Normal)   Pulse (!) 54   Ht 5\' 4"  (1.626 m)   Wt 140 lb 8 oz (63.7 kg)   SpO2 96%   BMI 24.12 kg/m  , BMI Body mass index is 24.12 kg/m. Constitutional:  oriented to person, place, and time. No distress.  HENT:  Head: Grossly normal Eyes:  no discharge. No scleral icterus.  Neck: No JVD, no carotid bruits  Cardiovascular: Regular rate and rhythm, no murmurs appreciated Pulmonary/Chest: Clear to auscultation bilaterally, no wheezes or  rails Abdominal: Soft.  no distension.  no tenderness.  Musculoskeletal: Normal range of motion Neurological:  normal muscle tone. Coordination normal. No atrophy Skin: Skin warm and dry Psychiatric: normal affect, pleasant  Recent Labs: 12/19/2023: ALT 15; BUN 34; Creatinine, Ser 1.17; Potassium 4.2; Sodium 141   Lipid Panel Lab Results  Component Value Date   CHOL 126 12/19/2023   HDL 42 (L) 12/19/2023   LDLCALC 61 12/19/2023   TRIG 146 12/19/2023    Wt Readings from Last 3 Encounters:  02/29/24 140 lb 8 oz (63.7 kg)  02/06/24 143 lb (64.9 kg)  12/28/23 144 lb (65.3 kg)     ASSESSMENT AND PLAN:  Problem List Items Addressed This Visit       Cardiology Problems   AAA (abdominal aortic aneurysm) without rupture (HCC)   Relevant Orders   EKG 12-Lead (Completed)   Bilateral carotid artery stenosis   CAD (coronary artery disease), autologous vein bypass graft   Relevant Orders   EKG 12-Lead (Completed)   Angina pectoris (HCC) - Primary   Relevant Orders   EKG 12-Lead (Completed)   Orthostatic hypotension   Hyperlipidemia     Other   Controlled type 2 diabetes with neuropathy (HCC)   Relevant Medications   empagliflozin (JARDIANCE) 10 MG TABS tablet   Pulmonary emphysema (HCC)   Relevant Orders   EKG 12-Lead (Completed)   Fatigue   Bradycardia   Relevant Orders   EKG 12-Lead (Completed)   Other Visit Diagnoses       Nausea and vomiting, unspecified vomiting type          Coronary artery disease with stable angina History of CABG, catheterization August 2022 Currently with no symptoms of angina. No further workup at this time. Continue current medication regimen.  Case previously discussed with Dr. Alvenia Aus:  "tight mid left circumflex stenosis feeding an OM branch that currently does not have a patent graft.  The lesion is heavily calcified and the vessel distally is tortuous and thus its not straightforward for PCI and will require atherectomy.  recommend  maximizing antianginal therapy before considering high risk PCI. " Continue medical management as above  Hyperlipidemia On Crestor  40 daily but having cramping in her legs Recommend try magnesium , co-Q10, electrolytes and her water If symptoms persist may need to temporarily hold Crestor , retry lower dose, consider adding Zetia if numbers not at goal Lastly could try Repatha  Type 2 diabetes with complications A1c running low 6 range Working with Dr.  Tullo She is concerned about adding Mounjaro  with Jardiance, numbers discussed with her, heading in the right direction  PAD History of carotid endarterectomy  40 to 59% disease on the right on recent ultrasound, results discussed  Insomnia Recommend she try to put herself to bed 10:00 rather than fall asleep in front of the TV and reposition to her bed at 1 or 2 AM    Signed, Juanda Noon, M.D., Ph.D. [facility] [place], [place] [phone]

## 2024-02-29 ENCOUNTER — Ambulatory Visit: Attending: Cardiovascular Disease | Admitting: Cardiovascular Disease

## 2024-02-29 ENCOUNTER — Encounter: Payer: Self-pay | Admitting: Cardiovascular Disease

## 2024-02-29 VITALS — BP 122/62 | HR 54 | Ht 64.0 in | Wt 140.5 lb

## 2024-02-29 DIAGNOSIS — J432 Centrilobular emphysema: Secondary | ICD-10-CM | POA: Insufficient documentation

## 2024-02-29 DIAGNOSIS — I7143 Infrarenal abdominal aortic aneurysm, without rupture: Secondary | ICD-10-CM | POA: Insufficient documentation

## 2024-02-29 DIAGNOSIS — R001 Bradycardia, unspecified: Secondary | ICD-10-CM | POA: Diagnosis not present

## 2024-02-29 DIAGNOSIS — I6523 Occlusion and stenosis of bilateral carotid arteries: Secondary | ICD-10-CM | POA: Diagnosis not present

## 2024-02-29 DIAGNOSIS — I209 Angina pectoris, unspecified: Secondary | ICD-10-CM | POA: Diagnosis not present

## 2024-02-29 DIAGNOSIS — T733XXD Exhaustion due to excessive exertion, subsequent encounter: Secondary | ICD-10-CM

## 2024-02-29 DIAGNOSIS — E1140 Type 2 diabetes mellitus with diabetic neuropathy, unspecified: Secondary | ICD-10-CM | POA: Diagnosis not present

## 2024-02-29 DIAGNOSIS — I25718 Atherosclerosis of autologous vein coronary artery bypass graft(s) with other forms of angina pectoris: Secondary | ICD-10-CM | POA: Insufficient documentation

## 2024-02-29 DIAGNOSIS — E782 Mixed hyperlipidemia: Secondary | ICD-10-CM | POA: Diagnosis not present

## 2024-02-29 DIAGNOSIS — T733XXA Exhaustion due to excessive exertion, initial encounter: Secondary | ICD-10-CM | POA: Insufficient documentation

## 2024-02-29 DIAGNOSIS — I951 Orthostatic hypotension: Secondary | ICD-10-CM | POA: Diagnosis not present

## 2024-02-29 DIAGNOSIS — R112 Nausea with vomiting, unspecified: Secondary | ICD-10-CM | POA: Diagnosis not present

## 2024-02-29 NOTE — Patient Instructions (Addendum)
 Medication Instructions:  No changes  Try magnesium , CoQ10, electrolytres for cramps If no better, you may need to reduce or stop crestor   If you need a refill on your cardiac medications before your next appointment, please call your pharmacy.   Lab work: No new labs needed  Testing/Procedures: No new testing needed  Follow-Up: At [facility], you and your health needs are our priority.  As part of our continuing mission to provide you with exceptional heart care, we have created designated Provider Care Teams.  These Care Teams include your primary Cardiologist (physician) and Advanced Practice Providers (APPs -  Physician Assistants and Nurse Practitioners) who all work together to provide you with the care you need, when you need it.  You will need a follow up appointment in 12 months  Providers on your designated Care Team:   Laneta Pintos, NP Varney Gentleman, PA-C Cadence Gennaro Khat, [place]  COVID-19 Vaccine Information can be found at: [email or web address] For questions related to vaccine distribution or appointments, please email [email or web address] or call [phone].

## 2024-03-06 DIAGNOSIS — H40003 Preglaucoma, unspecified, bilateral: Secondary | ICD-10-CM | POA: Diagnosis not present

## 2024-03-06 DIAGNOSIS — H353114 Nonexudative age-related macular degeneration, right eye, advanced atrophic with subfoveal involvement: Secondary | ICD-10-CM | POA: Diagnosis not present

## 2024-03-06 DIAGNOSIS — H353122 Nonexudative age-related macular degeneration, left eye, intermediate dry stage: Secondary | ICD-10-CM | POA: Diagnosis not present

## 2024-03-06 LAB — HM DIABETES EYE EXAM

## 2024-03-07 ENCOUNTER — Telehealth: Payer: Self-pay | Admitting: Internal Medicine

## 2024-03-07 NOTE — Telephone Encounter (Signed)
 Copied from CRM [id number]. Topic: Referral - Request for Referral >> Mar 07, 2024 11:34 AM Alyse July wrote: Did the patient discuss referral with their provider in the last year? Yes (If No - schedule appointment) (If Yes - send message)  Appointment offered? No  Type of order/referral and detailed reason for visit: Dermatology   Preference of office, provider, location: within [place] area (not [employer])  If referral order, have you been seen by this specialty before? Yes (If Yes, this issue or another issue? When? Where?  Can we respond through MyChart? No

## 2024-03-07 NOTE — Telephone Encounter (Signed)
 Spoke with pt and she stated that she has been seeing a dermatologist in [place] but she doesn't want to keep driving that far. I did let pt know that there is only two dermatology offices in [place] and [facility] is not accepting new pt and pt stated that she does not want to go to [facility]. Pt did say that she would like for someone to take a look at the place on her ear as soon as possible. So I have scheduled her an appt with Dr. Narendra for Monday.

## 2024-03-10 ENCOUNTER — Encounter: Payer: Self-pay | Admitting: Internal Medicine

## 2024-03-10 ENCOUNTER — Ambulatory Visit (INDEPENDENT_AMBULATORY_CARE_PROVIDER_SITE_OTHER): Admitting: Internal Medicine

## 2024-03-10 VITALS — BP 124/80 | HR 42 | Temp 98.0°F | Ht 64.0 in | Wt 140.8 lb

## 2024-03-10 DIAGNOSIS — I25118 Atherosclerotic heart disease of native coronary artery with other forms of angina pectoris: Secondary | ICD-10-CM | POA: Diagnosis not present

## 2024-03-10 DIAGNOSIS — L989 Disorder of the skin and subcutaneous tissue, unspecified: Secondary | ICD-10-CM | POA: Diagnosis not present

## 2024-03-10 DIAGNOSIS — R002 Palpitations: Secondary | ICD-10-CM

## 2024-03-10 DIAGNOSIS — Z7985 Long-term (current) use of injectable non-insulin antidiabetic drugs: Secondary | ICD-10-CM

## 2024-03-10 DIAGNOSIS — E1121 Type 2 diabetes mellitus with diabetic nephropathy: Secondary | ICD-10-CM

## 2024-03-10 LAB — BASIC METABOLIC PANEL WITH GFR
BUN: 30 mg/dL — ABNORMAL HIGH (ref 6–23)
CO2: 26 meq/L (ref 19–32)
Calcium: 9.9 mg/dL (ref 8.4–10.5)
Chloride: 107 meq/L (ref 96–112)
Creatinine, Ser: 1.23 mg/dL — ABNORMAL HIGH (ref 0.40–1.20)
GFR: 41.13 mL/min — ABNORMAL LOW (ref >60.00)
Glucose, Bld: 95 mg/dL (ref 70–99)
Potassium: 4.7 meq/L (ref 3.5–5.1)
Sodium: 143 meq/L (ref 135–145)

## 2024-03-10 NOTE — Progress Notes (Signed)
 Acute Office Visit  Subjective:     Patient ID: Sabrina Montoya, female    DOB: 04/03/1941, 83 y.o.   MRN: [id number]  Chief Complaint  Patient presents with   Acute Visit    Skin lesion on left ear x several months Was dry now weepy and painful  Would like to discuss Jardiance makes her Extremely Sleepy and her heart acts up where she will need a nitroglycerin . Patient states she has not needed nitroglycerin  in years.    HPI Patient is in today for follow-up of lesion below her left ear.  Patient states that she has had a skin lesion below her left ear for the last few months but it has become painful over the last couple weeks and she noticed it was "weepy" over the weekend.  She states that she put a bandage over it and now it appears to have dried out.  Patient states that she started on Jardiance approximately 1 month ago for proteinuria and diabetes.  However, patient states that since starting the medication she has noticed excessive fatigue and tiredness.  She also states that last week she had 2-3 episodes of chest pain with exertion which she has not had in a while and had to take nitroglycerin  for relief of this pain.  She also complained of some associated palpitations at this time.  No chest pain currently.  Review of Systems  Constitutional:  Positive for malaise/fatigue. Negative for chills and fever.  HENT: Negative.    Respiratory: Negative.    Cardiovascular:  Positive for chest pain and palpitations.       Patient complained of exertional chest pain 2-3 episodes over the last week requiring nitroglycerin  for relief  Gastrointestinal: Negative.   Musculoskeletal: Negative.   Skin:        Skin lesion below the left ear   Neurological: Negative.   Psychiatric/Behavioral: Negative.          Objective:    BP 124/80   Pulse (!) 42   Temp 98 F (36.7 C)   Ht 5\' 4"  (1.626 m)   Wt 140 lb 12.8 oz (63.9 kg)   SpO2 94%   BMI 24.17 kg/m    Physical  Exam Constitutional:      Appearance: Normal appearance.  HENT:     Head: Normocephalic and atraumatic.  Cardiovascular:     Rate and Rhythm: Normal rate and regular rhythm.     Heart sounds: Normal heart sounds.  Pulmonary:     Effort: Pulmonary effort is normal.     Breath sounds: Normal breath sounds.  Skin:    Comments: Raised papular lesion below the left ear with some erythema at the base that is not well-circumscribed.  Picture in chart  Neurological:     Mental Status: She is alert and oriented to person, place, and time.  Psychiatric:        Mood and Affect: Mood normal.     No results found for any visits on 03/10/24.      Assessment & Plan:   Problem List Items Addressed This Visit       Cardiovascular and Mediastinum   Coronary artery disease involving native coronary artery of native heart   - Patient states that over the last week she has had 2-3 episodes of exertional chest pain associate with palpitations requiring nitroglycerin  for relief -She attributes this to the Jardiance causing fatigue and palpitations -Given the patient has a history of CAD with severe  three-vessel disease I am concerned for worsening angina -Will obtain EKG today -Patient will need to follow-up with her cardiologist ASAP for further evaluation -Continue with Imdur  and metoprolol  for now and use nitroglycerin  as needed -Patient has no chest pain currently -If patient has recurrent chest pain she was advised to follow-up in the ED        Endocrine   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 -Patient was noted to have an elevated UACR at her last visit and was started on Jardiance -Patient states that she has had increased fatigue and palpitations on Jardiance and does not want to take this medication more -She states that she had stopped her  Mounjaro  but will resume this medication now -Will obtain BMP given patient's fatigue and palpitations to rule out any electrolyte abnormalities as well as worsening renal function -Patient was advised to stop Jardiance and to continue with Mounjaro  only for now -Patient is intolerant of ARB as well as SGLT2.  May need to try Kerendia if her UACR is elevated -Will consider referral back to nephrology if her renal function is worsening -No further workup at this time      Relevant Orders   Basic metabolic panel with GFR     Musculoskeletal and Integument   Skin lesion of face   - Patient has a lesion below her left ear for months but became painful and "weepy" over the weekend.  Patient states that the patient has since dried out but remains concerned about this lesion -Photo his in the chart as well as my note -Given that she has had some pain and discharge from the area and that the lesion is not circumscribed I am concerned for possible underlying malignancy -I have put in an urgent referral to dermatology -Will follow-up on recommendations      Relevant Orders   Ambulatory referral to Dermatology   Other Visit Diagnoses       Heart palpitations       Relevant Orders   EKG 12-Lead (Completed)       No orders of the defined types were placed in this encounter.   No follow-ups on file.  Tony Granquist, MD

## 2024-03-10 NOTE — Assessment & Plan Note (Addendum)
-   Patient states that over the last week she has had 2-3 episodes of exertional chest pain associate with palpitations requiring nitroglycerin  for relief -She attributes this to the Jardiance causing fatigue and palpitations -Given the patient has a history of CAD with severe three-vessel disease I am concerned for worsening angina -Will obtain EKG today -Patient will need to follow-up with her cardiologist ASAP for further evaluation -Continue with Imdur  and metoprolol  for now and use nitroglycerin  as needed -Patient has no chest pain currently -If patient has recurrent chest pain she was advised to follow-up in the ED

## 2024-03-10 NOTE — Assessment & Plan Note (Addendum)
-   Patient with a history of type 2 diabetes which is well-controlled on Mounjaro . -Patient was noted to have an elevated UACR at her last visit and was started on Jardiance -Patient states that she has had increased fatigue and palpitations on Jardiance and does not want to take this medication more -She states that she had stopped her Mounjaro  but will resume this medication now -Will obtain BMP given patient's fatigue and palpitations to rule out any electrolyte abnormalities as well as worsening renal function -Patient was advised to stop Jardiance and to continue with Mounjaro  only for now -Patient is intolerant of ARB as well as SGLT2.  May need to try Kerendia if her UACR is elevated -Will consider referral back to nephrology if her renal function is worsening -No further workup at this time

## 2024-03-10 NOTE — Patient Instructions (Addendum)
-   It was a pleasure meeting you today -Would hold off on the Jardiance at this time given that you have been having side effects from this medication.  Please resume your Mounjaro  -Given that you have had episodes of chest pain over the last week with exertion that you did not have prior to this I recommend following up with your cardiologist -We will do an EKG here today to ensure that there is no acute changes that would require referral to the hospital -I have put in an urgent referral to dermatology for further evaluation of the lesion below your left ear -Please contact us  with any questions or concerns or if your symptoms worsen please follow-up with us  -If you get any recurrent chest pain please follow-up in the ED for further evaluation

## 2024-03-10 NOTE — Assessment & Plan Note (Signed)
-   Patient has a lesion below her left ear for months but became painful and "weepy" over the weekend.  Patient states that the patient has since dried out but remains concerned about this lesion -Photo his in the chart as well as my note -Given that she has had some pain and discharge from the area and that the lesion is not circumscribed I am concerned for possible underlying malignancy -I have put in an urgent referral to dermatology -Will follow-up on recommendations

## 2024-03-24 DIAGNOSIS — L570 Actinic keratosis: Secondary | ICD-10-CM | POA: Diagnosis not present

## 2024-03-24 DIAGNOSIS — D044 Carcinoma in situ of skin of scalp and neck: Secondary | ICD-10-CM | POA: Diagnosis not present

## 2024-03-24 DIAGNOSIS — L821 Other seborrheic keratosis: Secondary | ICD-10-CM | POA: Diagnosis not present

## 2024-03-24 DIAGNOSIS — D485 Neoplasm of uncertain behavior of skin: Secondary | ICD-10-CM | POA: Diagnosis not present

## 2024-03-25 ENCOUNTER — Encounter (INDEPENDENT_AMBULATORY_CARE_PROVIDER_SITE_OTHER): Payer: Self-pay

## 2024-03-26 ENCOUNTER — Encounter: Payer: Self-pay | Admitting: Emergency Medicine

## 2024-03-26 ENCOUNTER — Ambulatory Visit: Payer: Self-pay

## 2024-03-26 ENCOUNTER — Emergency Department

## 2024-03-26 ENCOUNTER — Other Ambulatory Visit: Payer: Self-pay

## 2024-03-26 ENCOUNTER — Emergency Department
Admission: EM | Admit: 2024-03-26 | Discharge: 2024-03-26 | Disposition: A | Discharge status: Home / Self Care | Attending: Emergency Medicine | Admitting: Emergency Medicine

## 2024-03-26 DIAGNOSIS — R1012 Left upper quadrant pain: Secondary | ICD-10-CM | POA: Insufficient documentation

## 2024-03-26 DIAGNOSIS — N281 Cyst of kidney, acquired: Secondary | ICD-10-CM | POA: Diagnosis not present

## 2024-03-26 DIAGNOSIS — I7143 Infrarenal abdominal aortic aneurysm, without rupture: Secondary | ICD-10-CM | POA: Diagnosis not present

## 2024-03-26 DIAGNOSIS — R1032 Left lower quadrant pain: Secondary | ICD-10-CM | POA: Diagnosis not present

## 2024-03-26 DIAGNOSIS — R1013 Epigastric pain: Secondary | ICD-10-CM | POA: Insufficient documentation

## 2024-03-26 DIAGNOSIS — K5792 Diverticulitis of intestine, part unspecified, without perforation or abscess without bleeding: Secondary | ICD-10-CM | POA: Diagnosis not present

## 2024-03-26 DIAGNOSIS — R112 Nausea with vomiting, unspecified: Secondary | ICD-10-CM | POA: Insufficient documentation

## 2024-03-26 LAB — URINALYSIS, ROUTINE W REFLEX MICROSCOPIC
Bilirubin Urine: NEGATIVE
Glucose, UA: NEGATIVE mg/dL
Hgb urine dipstick: NEGATIVE
Ketones, ur: NEGATIVE mg/dL
Leukocytes,Ua: NEGATIVE
Nitrite: NEGATIVE
Protein, ur: NEGATIVE mg/dL
Specific Gravity, Urine: 1.023 (ref 1.005–1.030)
pH: 6 (ref 5.0–8.0)

## 2024-03-26 LAB — COMPREHENSIVE METABOLIC PANEL WITH GFR
ALT: 21 U/L (ref 0–44)
AST: 46 U/L — ABNORMAL HIGH (ref 15–41)
Albumin: 4.5 g/dL (ref 3.5–5.0)
Alkaline Phosphatase: 51 U/L (ref 38–126)
Anion gap: 11 (ref 5–15)
BUN: 24 mg/dL — ABNORMAL HIGH (ref 8–23)
CO2: 25 mmol/L (ref 22–32)
Calcium: 10 mg/dL (ref 8.9–10.3)
Chloride: 105 mmol/L (ref 98–111)
Creatinine, Ser: 1.19 mg/dL — ABNORMAL HIGH (ref 0.44–1.00)
GFR, Estimated: 46 mL/min — ABNORMAL LOW (ref >60)
Glucose, Bld: 114 mg/dL — ABNORMAL HIGH (ref 70–99)
Potassium: 3.8 mmol/L (ref 3.5–5.1)
Sodium: 141 mmol/L (ref 135–145)
Total Bilirubin: 0.7 mg/dL (ref 0.0–1.2)
Total Protein: 7.6 g/dL (ref 6.5–8.1)

## 2024-03-26 LAB — CBC
HCT: 34 % — ABNORMAL LOW (ref 36.0–46.0)
Hemoglobin: 11 g/dL — ABNORMAL LOW (ref 12.0–15.0)
MCH: 28.8 pg (ref 26.0–34.0)
MCHC: 32.4 g/dL (ref 30.0–36.0)
MCV: 89 fL (ref 80.0–100.0)
Platelets: 213 10*3/uL (ref 150–400)
RBC: 3.82 MIL/uL — ABNORMAL LOW (ref 3.87–5.11)
RDW: 13.4 % (ref 11.5–15.5)
WBC: 7.5 10*3/uL (ref 4.0–10.5)
nRBC: 0 % (ref 0.0–0.2)

## 2024-03-26 LAB — LIPASE, BLOOD: Lipase: 40 U/L (ref 11–51)

## 2024-03-26 MED ORDER — IOHEXOL 300 MG/ML  SOLN
100.0000 mL | Freq: Once | INTRAMUSCULAR | Status: AC | PRN
  Administered 2024-03-26: 100 mL via INTRAVENOUS

## 2024-03-26 NOTE — Discharge Instructions (Signed)
 CT imaging of your abdomen showed no obvious signs of diverticulitis or other infections.  The rest of your laboratory workup was reassuring.  This may have been a bowel spasm related to constipation but otherwise you can take Tylenol  as needed over the next several days.  Please follow-up with your primary care provider as needed.

## 2024-03-26 NOTE — ED Provider Notes (Signed)
 [facility] Provider Note    Event Date/Time   First MD Initiated Contact with Patient 03/26/24 1722     (approximate)   History   Abdominal Pain   HPI Sabrina Montoya is a 83 y.o. female presenting today for abdominal pain.  Patient states she had sudden onset of left lower sided abdominal pain associated with diarrhea.  Also had 1 episode of nausea with vomiting.  Currently states the abdominal pain is less but it comes in waves.  No ongoing nausea.  Denies fever, chills, chest pain, shortness of breath.  Prior history of diverticulitis which felt similar as well as exploratory laparotomy.  Denies dysuria.     Physical Exam   Triage Vital Signs: ED Triage Vitals [03/26/24 1620]  Encounter Vitals Group     BP (!) 185/66     Systolic BP Percentile      Diastolic BP Percentile      Pulse Rate 63     Resp 17     Temp 98.6 F (37 C)     Temp Source Oral     SpO2 100 %     Weight 138 lb (62.6 kg)     Height 5\' 4"  (1.626 m)     Head Circumference      Peak Flow      Pain Score 0     Pain Loc      Pain Education      Exclude from Growth Chart     Most recent vital signs: Vitals:   03/26/24 1620 03/26/24 1854  BP: (!) 185/66 (!) 160/60  Pulse: 63 66  Resp: 17 17  Temp: 98.6 F (37 C) 98.5 F (36.9 C)  SpO2: 100% 98%   Physical Exam: I have reviewed the vital signs and nursing notes. General: Awake, alert, no acute distress.  Nontoxic appearing. Head:  Atraumatic, normocephalic.   ENT:  EOM intact, PERRL. Oral mucosa is pink and moist with no lesions. Neck: Neck is supple with full range of motion, No meningeal signs. Cardiovascular:  RRR, No murmurs. Peripheral pulses palpable and equal bilaterally. Respiratory:  Symmetrical chest wall expansion.  No rhonchi, rales, or wheezes.  Good air movement throughout.  No use of accessory muscles.   Musculoskeletal:  No cyanosis or edema. Moving extremities with full ROM Abdomen:  Soft, tenderness  palpation most prominent in the left lower quadrant with mild tenderness in the left upper and epigastric regions, nondistended. Neuro:  GCS 15, moving all four extremities, interacting appropriately. Speech clear. Psych:  Calm, appropriate.   Skin:  Warm, dry, no rash.    ED Results / Procedures / Treatments   Labs (all labs ordered are listed, but only abnormal results are displayed) Labs Reviewed  COMPREHENSIVE METABOLIC PANEL WITH GFR - Abnormal; Notable for the following components:      Result Value   Glucose, Bld 114 (*)    BUN 24 (*)    Creatinine, Ser 1.19 (*)    AST 46 (*)    GFR, Estimated 46 (*)    All other components within normal limits  CBC - Abnormal; Notable for the following components:   RBC 3.82 (*)    Hemoglobin 11.0 (*)    HCT 34.0 (*)    All other components within normal limits  URINALYSIS, ROUTINE W REFLEX MICROSCOPIC - Abnormal; Notable for the following components:   Color, Urine YELLOW (*)    APPearance CLEAR (*)    All other  components within normal limits  LIPASE, BLOOD     EKG    RADIOLOGY Independently interpreted CT abdomen/pelvis with no acute pathology   PROCEDURES:  Critical Care performed: No  Procedures   MEDICATIONS ORDERED IN ED: Medications  iohexol  (OMNIPAQUE ) 300 MG/ML solution 100 mL (100 mLs Intravenous Contrast Given 03/26/24 1757)     IMPRESSION / MDM / ASSESSMENT AND PLAN / ED COURSE  I reviewed the triage vital signs and the nursing notes.                              Differential diagnosis includes, but is not limited to, diverticulitis, colitis, enteritis, SBO  Patient's presentation is most consistent with acute complicated illness / injury requiring diagnostic workup.  Patient is an 83 year old female presenting today for left lower quadrant pain with tenderness palpation most prominently in that region.  Vital signs otherwise stable.  CBC with no leukocytosis.  Lipase normal.  CMP largely consistent  with her baseline.  Will get CT imaging for further evaluation.  Patient declined any pain or nausea medication at this time.  CT abdomen/pelvis showed no acute intra-abdominal pathology.  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  Patient otherwise safe for discharge with no life-threatening pathology at this time.  May have been from bowel spasms or constipation.  Told to follow-up with PCP in the next couple of days for reassessment as needed and given strict return precautions.  Clinical Course as of 03/26/24 1927  Wed Mar 26, 2024  1728 Comprehensive metabolic panel(!) Creatinine function relatively consistent with her baseline [DW]    Clinical Course User Index [DW] Kandee Orion, MD     FINAL CLINICAL IMPRESSION(S) / ED DIAGNOSES   Final diagnoses:  Left lower quadrant pain     Rx / DC Orders   ED Discharge Orders     None        Note:  This document was prepared using Dragon voice recognition software and may include unintentional dictation errors.   Kandee Orion, MD 03/26/24 Roberto Chinchilla

## 2024-03-26 NOTE — Telephone Encounter (Signed)
 Pt is scheduled at [facility] in [place] today.

## 2024-03-26 NOTE — Telephone Encounter (Signed)
 Chief Complaint: Abdominal pain Symptoms: severe abdominal pain, one vomiting episode Frequency: since this morning Pertinent Negatives: Patient denies fever, back pain, diarrhea Disposition: [] ED /[x] Urgent Care (no appt availability in office) / [] Appointment(In office/virtual)/ []  [facility]/ [] Home Care/ [] Refused Recommended Disposition /[] [facility]/ []  Follow-up with PCP Additional Notes: Patient called in stating she believes she was having a diverticulitis flare up but is unsure, since it has been a long time since she has had a flare up. Patient states after breakfast this morning she felt she needed to have a bowel movement, and sat on the commode for 1 hour and did not get hardly any stool out, but stated she was having very intense pain right above the pubic area. Patient also stated she ended up having 1 vomiting episode at that time (denied appearance of coffee grounds, blood, abnormal appearance). Patient also denies recent fever. No in-office availability for today. Patient booked appt at nearest UC.    Copied from CRM [id number]. Topic: Clinical - Medical Advice >> Mar 26, 2024 11:43 AM Keitha Pata L wrote: Reason for CRM: patient stated that she has Diverticulitis and experiencing some pain and needs the nurse or doctor to call back Reason for Disposition  [1] MILD-MODERATE pain AND [2] constant AND [3] present > 2 hours  Answer Assessment - Initial Assessment Questions 1. LOCATION: "Where does it hurt?"      Right above pubic area 2. RADIATION: "Does the pain shoot anywhere else?" (e.g., chest, back)     no 3. ONSET: "When did the pain begin?" (e.g., minutes, hours or days ago)      This morning 4. SUDDEN: "Gradual or sudden onset?"     Gradual onset over the course of the morning 5. PATTERN "Does the pain come and go, or is it constant?"    - If it comes and goes: "How long does it last?" "Do you have pain now?"     (Note: Comes and goes means the  pain is intermittent. It goes away completely between bouts.)    - If constant: "Is it getting better, staying the same, or getting worse?"      (Note: Constant means the pain never goes away completely; most serious pain is constant and gets worse.)      N/a 6. SEVERITY: "How bad is the pain?"  (e.g., Scale 1-10; mild, moderate, or severe)    - MILD (1-3): Doesn't interfere with normal activities, abdomen soft and not tender to touch.     - MODERATE (4-7): Interferes with normal activities or awakens from sleep, abdomen tender to touch.     - SEVERE (8-10): Excruciating pain, doubled over, unable to do any normal activities.       Patient states pain felt like she was constipated and pain was worsening 7. RECURRENT SYMPTOM: "Have you ever had this type of stomach pain before?" If Yes, ask: "When was the last time?" and "What happened that time?"      Yes when she had diverticulitis flare up 8. CAUSE: "What do you think is causing the stomach pain?"     Suspected diverticulitis flare up 9. RELIEVING/AGGRAVATING FACTORS: "What makes it better or worse?" (e.g., antacids, bending or twisting motion, bowel movement)     Patient states lying down helped the pain mildly 10. OTHER SYMPTOMS: "Do you have any other symptoms?" (e.g., back pain, diarrhea, fever, urination pain, vomiting)       Vomiting  Protocols used: Abdominal Pain - Female-A-AH

## 2024-03-26 NOTE — ED Triage Notes (Signed)
 Patient to ED via POV for abd pain and vomiting. Started this AM. Hx of diverticulitis. Denies pain currently

## 2024-03-28 ENCOUNTER — Telehealth: Payer: Self-pay | Admitting: Internal Medicine

## 2024-03-28 NOTE — Telephone Encounter (Unsigned)
 Copied from CRM [id number]. Topic: Clinical - Medical Advice >> Mar 28, 2024 10:28 AM Danae Duncans wrote: Reason for CRM: Pt advise she went to dermatology Monday and they found carcinoma they would like to test - pt advise she is afraid of needles and would like nurse or pcp to call her  [phone]

## 2024-04-04 NOTE — Telephone Encounter (Signed)
 Patient is needing a call back today regarding the her next visit to the dermatology

## 2024-04-06 HISTORY — PX: OTHER SURGICAL HISTORY: SHX169

## 2024-04-14 DIAGNOSIS — D044 Carcinoma in situ of skin of scalp and neck: Secondary | ICD-10-CM | POA: Diagnosis not present

## 2024-04-15 DIAGNOSIS — Z85828 Personal history of other malignant neoplasm of skin: Secondary | ICD-10-CM | POA: Diagnosis not present

## 2024-04-15 DIAGNOSIS — Z86007 Personal history of in-situ neoplasm of skin: Secondary | ICD-10-CM | POA: Diagnosis not present

## 2024-04-15 DIAGNOSIS — D485 Neoplasm of uncertain behavior of skin: Secondary | ICD-10-CM | POA: Diagnosis not present

## 2024-04-15 DIAGNOSIS — L821 Other seborrheic keratosis: Secondary | ICD-10-CM | POA: Diagnosis not present

## 2024-04-15 DIAGNOSIS — L820 Inflamed seborrheic keratosis: Secondary | ICD-10-CM | POA: Diagnosis not present

## 2024-04-15 DIAGNOSIS — L218 Other seborrheic dermatitis: Secondary | ICD-10-CM | POA: Diagnosis not present

## 2024-04-15 DIAGNOSIS — Z08 Encounter for follow-up examination after completed treatment for malignant neoplasm: Secondary | ICD-10-CM | POA: Diagnosis not present

## 2024-04-15 DIAGNOSIS — L570 Actinic keratosis: Secondary | ICD-10-CM | POA: Diagnosis not present

## 2024-04-15 DIAGNOSIS — X32XXXA Exposure to sunlight, initial encounter: Secondary | ICD-10-CM | POA: Diagnosis not present

## 2024-05-14 DIAGNOSIS — L820 Inflamed seborrheic keratosis: Secondary | ICD-10-CM | POA: Diagnosis not present

## 2024-05-14 DIAGNOSIS — L905 Scar conditions and fibrosis of skin: Secondary | ICD-10-CM | POA: Diagnosis not present

## 2024-05-20 ENCOUNTER — Encounter: Payer: Self-pay | Admitting: Internal Medicine

## 2024-05-20 ENCOUNTER — Ambulatory Visit (INDEPENDENT_AMBULATORY_CARE_PROVIDER_SITE_OTHER): Admitting: Internal Medicine

## 2024-05-20 VITALS — BP 126/78 | HR 56 | Temp 98.3°F | Ht 64.0 in | Wt 140.4 lb

## 2024-05-20 DIAGNOSIS — E1121 Type 2 diabetes mellitus with diabetic nephropathy: Secondary | ICD-10-CM

## 2024-05-20 DIAGNOSIS — D631 Anemia in chronic kidney disease: Secondary | ICD-10-CM | POA: Diagnosis not present

## 2024-05-20 DIAGNOSIS — G8929 Other chronic pain: Secondary | ICD-10-CM | POA: Diagnosis not present

## 2024-05-20 DIAGNOSIS — M810 Age-related osteoporosis without current pathological fracture: Secondary | ICD-10-CM

## 2024-05-20 DIAGNOSIS — N189 Chronic kidney disease, unspecified: Secondary | ICD-10-CM

## 2024-05-20 DIAGNOSIS — E782 Mixed hyperlipidemia: Secondary | ICD-10-CM | POA: Diagnosis not present

## 2024-05-20 DIAGNOSIS — L989 Disorder of the skin and subcutaneous tissue, unspecified: Secondary | ICD-10-CM

## 2024-05-20 DIAGNOSIS — H9202 Otalgia, left ear: Secondary | ICD-10-CM | POA: Diagnosis not present

## 2024-05-20 LAB — LIPID PANEL W/REFLEX DIRECT LDL
Cholesterol: 146 mg/dL (ref <200)
HDL: 52 mg/dL (ref >=50)
LDL Cholesterol (Calc): 71 mg/dL
Non-HDL Cholesterol (Calc): 94 mg/dL (ref <130)
Total CHOL/HDL Ratio: 2.8 (calc) (ref <5.0)
Triglycerides: 144 mg/dL (ref <150)

## 2024-05-20 NOTE — Progress Notes (Signed)
 Subjective:  Patient ID: Sabrina Montoya, female    DOB: 1941-09-10  Age: 83 y.o. 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 Diagnoses of Age-related osteoporosis without current pathological fracture, Chronic left ear pain, Skin lesion of face, Anemia due to chronic kidney disease, unspecified CKD stage, and Mixed hyperlipidemia were also pertinent to this visit.   HPI Sabrina Montoya presents for  Chief Complaint  Patient presents with   Medical Management of Chronic Issues    3 mnth    1) Type 2 DM: no longer taking Jardiance  due to side effects of fatigue and palpitations per May visit with Dr Onesimo.Sabrina Montoya lytes were normal.  HAS NOT RESUMED TAKING MOUNJARO  .due to cost of $1100 per 90 days supply . UaCr done last visit > 200.  Nephrology has opined that she does not have CKD. ([facility])  2) LLQ pain in late May evaluated at [facility] with lipase.  CT abd and pelvis for diverticulitis. CT was negative lipase normal and UA normal. Pain has since resolved, and  has several formed stools daily with no abd pain .  2) Stable aneurysmal infrarenal abdominal aorta noted on May 2025 CT measuring 4.4 x 3.5 cm and extending 4 cm in the craniocaudal dimension. No iliac aneurysm. Severe atherosclerotic plaque of the aorta and its branches.  4) Skin Cancer :  had a biopsy of left neck lesion by [facility] followed by a Moh's procedure   same side as carotid surgery  neck has been sore, she has been advised to massage the internal stitches 10 times daily   PATH UNKNOWN.  ALSO HAD AK OF LEFT CHEECK TREATED WITH FLUORAURACIL    5) LEFT EAR : CONSTANT PAIN IN LEFT EAR,  AGGRAVATED BY CHEWING. ALSO HAVING NECK PAIN SEVERE AT TIEMES, NO RADIATION.  NO RADIATION TO EITHER ARM.  HISTORY OF OF CERVICAL FUSION.  SIDE SLEEPIER       Outpatient Medications Prior to Visit  Medication Sig Dispense Refill    amLODipine  (NORVASC ) 5 MG tablet Take 1 tablet (5 mg total) by mouth daily. 90 tablet 3   aspirin  81 MG tablet Take 81 mg by mouth daily.     cholecalciferol (VITAMIN D ) 1000 UNITS tablet Take 1,000 Units by mouth daily.     isosorbide  mononitrate (IMDUR ) 60 MG 24 hr tablet Take 1 tablet (60 mg total) by mouth daily. 90 tablet 3   metoprolol  succinate (TOPROL -XL) 50 MG 24 hr tablet Take 1 tablet (50 mg total) by mouth daily. Take with or immediately following a meal. 90 tablet 3   Multiple Vitamins-Minerals (PRESERVISION AREDS 2 PO) Take by mouth 2 (two) times daily. Patient is taking VisiUltra right now eye doctor approved of this change.     nitroGLYCERIN  (NITROSTAT ) 0.4 MG SL tablet DISSOLVE 1 TABLET UNDER THE TONGUE EVERY 5 MINUTES AS NEEDED FOR CHEST PAIN 50 tablet 4   omeprazole  (PRILOSEC) 20 MG capsule TAKE 1 CAPSULE(20 MG) BY MOUTH DAILY 90 capsule 1   rosuvastatin  (CRESTOR ) 40 MG tablet TAKE 1 TABLET(40 MG) BY MOUTH DAILY 90 tablet 1   Turmeric (QC TUMERIC COMPLEX PO) Take 1 mg by mouth daily.     empagliflozin (JARDIANCE) 10 MG TABS tablet Take 10 mg by mouth daily.     tirzepatide  (MOUNJARO ) 5 MG/0.5ML Pen Inject 5 mg into the skin once a week. (Patient taking differently: Inject 5 mg into  the skin once a week. Uses every 3 weeks) 6 mL 2   tiZANidine  (ZANAFLEX ) 2 MG tablet TAKE 1 TABLET(2 MG) BY MOUTH AT BEDTIME (Patient not taking: Reported on 05/20/2024) 30 tablet 0   No facility-administered medications prior to visit.    Review of Systems;  Patient denies headache, fevers, malaise, unintentional weight loss, skin rash, eye pain, sinus congestion and sinus pain, sore throat, dysphagia,  hemoptysis , cough, dyspnea, wheezing, chest pain, palpitations, orthopnea, edema, abdominal pain, nausea, melena, diarrhea, constipation, flank pain, dysuria, hematuria, urinary  Frequency, nocturia, numbness, tingling, seizures,  Focal weakness, Loss of consciousness,  Tremor, insomnia,  depression, anxiety, and suicidal ideation.      Objective:  BP 126/78   Pulse (!) 56   Temp 98.3 F (36.8 C) (Oral)   Ht 5' 4 (1.626 m)   Wt 140 lb 6.4 oz (63.7 kg)   SpO2 97%   BMI 24.10 kg/m   BP Readings from Last 3 Encounters:  05/20/24 126/78  03/26/24 (!) 154/72  03/10/24 124/80    Wt Readings from Last 3 Encounters:  05/20/24 140 lb 6.4 oz (63.7 kg)  03/26/24 138 lb (62.6 kg)  03/10/24 140 lb 12.8 oz (63.9 kg)    Physical Exam Vitals reviewed.  Constitutional:      General: She is not in acute distress.    Appearance: Normal appearance. She is normal weight. She is not ill-appearing, toxic-appearing or diaphoretic.  HENT:     Head: Normocephalic.  Eyes:     General: No scleral icterus.       Right eye: No discharge.        Left eye: No discharge.     Conjunctiva/sclera: Conjunctivae normal.  Cardiovascular:     Rate and Rhythm: Normal rate and regular rhythm.     Heart sounds: Normal heart sounds.  Pulmonary:     Effort: Pulmonary effort is normal. No respiratory distress.     Breath sounds: Normal breath sounds.  Musculoskeletal:        General: Normal range of motion.  Skin:    General: Skin is warm and dry.  Neurological:     General: No focal deficit present.     Mental Status: She is alert and oriented to person, place, and time. Mental status is at baseline.  Psychiatric:        Mood and Affect: Mood normal.        Behavior: Behavior normal.        Thought Content: Thought content normal.        Judgment: Judgment normal.     Lab Results  Component Value Date   HGBA1C 6.3 12/19/2023   HGBA1C 5.6 09/17/2023   HGBA1C 6.5 06/01/2023    Lab Results  Component Value Date   CREATININE 1.19 (H) 03/26/2024   CREATININE 1.23 (H) 03/10/2024   CREATININE 1.17 12/19/2023    Lab Results  Component Value Date   WBC 7.5 03/26/2024   HGB 11.0 (L) 03/26/2024   HCT 34.0 (L) 03/26/2024   PLT 213 03/26/2024   GLUCOSE 114 (H) 03/26/2024    CHOL 126 12/19/2023   TRIG 146 12/19/2023   HDL 42 (L) 12/19/2023   LDLDIRECT 50.0 06/01/2023   LDLCALC 61 12/19/2023   ALT 21 03/26/2024   AST 46 (H) 03/26/2024   NA 141 03/26/2024   K 3.8 03/26/2024   CL 105 03/26/2024   CREATININE 1.19 (H) 03/26/2024   BUN 24 (H) 03/26/2024   CO2 25  03/26/2024   TSH 1.74 01/23/2023   INR 1.1 06/06/2021   HGBA1C 6.3 12/19/2023   MICROALBUR 5.7 (H) 02/06/2024    CT ABDOMEN PELVIS W CONTRAST Result Date: 03/26/2024 CLINICAL DATA:  Fredericka left lower quadrant pain associated with nausea and vomiting, prior history of diverticulitis and exploratory laparotomy, evaluating for the same versus SBO EXAM: CT ABDOMEN AND PELVIS WITH CONTRAST TECHNIQUE: Multidetector CT imaging of the abdomen and pelvis was performed using the standard protocol following bolus administration of intravenous contrast. RADIATION DOSE REDUCTION: This exam was performed according to the departmental dose-optimization program which includes automated exposure control, adjustment of the mA and/or kV according to patient size and/or use of iterative reconstruction technique. CONTRAST:  OMNIPAQUE  IOHEXOL  300 MG/ML  SOLN COMPARISON:  CT abdomen pelvis 10/05/2022 FINDINGS: Lower chest: No acute abnormality. Hepatobiliary: No focal liver abnormality. Status post cholecystectomy. No biliary dilatation. Pancreas: No focal lesion. Normal pancreatic contour. No surrounding inflammatory changes. No main pancreatic ductal dilatation. Spleen: Normal in size without focal abnormality.  Splenule noted. Adrenals/Urinary Tract: No adrenal nodule bilaterally. Bilateral kidneys enhance symmetrically. Fluid dense lesions of the kidney likely represents a renal cysts. Simple renal cysts, in the absence of clinically indicated signs/symptoms, require no independent follow-up. No hydronephrosis. No hydroureter. The urinary bladder is unremarkable. On delayed imaging, there is no urothelial wall thickening and  there are no filling defects in the opacified portions of the bilateral collecting systems or ureters. Stomach/Bowel: Stomach is within normal limits. No evidence of bowel wall thickening or dilatation. Appendix appears normal. Vascular/Lymphatic: Stable aneurysmal infrarenal abdominal aorta measuring 4.4 x 3.5 cm and extending 4 cm in the craniocaudal dimension. No iliac aneurysm. Severe atherosclerotic plaque of the aorta and its branches. No abdominal, pelvic, or inguinal lymphadenopathy. Reproductive: Status post hysterectomy. No adnexal masses. Other: No intraperitoneal free fluid. No intraperitoneal free gas. No organized fluid collection. Musculoskeletal: No abdominal wall hernia or abnormality. No suspicious lytic or blastic osseous lesions. No acute displaced fracture. Multilevel degenerative changes of the spine. IMPRESSION: 1. Colonic diverticulosis with no acute diverticulitis. 2. 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 Recommend follow-up CT or MR as appropriate in 12 months and referral to or continued care with vascular specialist. (Ref.: J Vasc Surg. 2018; 67:2-77 and J Am Coll Radiol 2013;10(10):789-794.). Aortic aneurysm NOS (ICD10-I71.9). 3.  Aortic Atherosclerosis (ICD10-I70.0). 4. Status post cholecystectomy and hysterectomy. Electronically Signed   By: Morgane  Naveau M.D.   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 She had acute renal failure during a trial of telmisartan  in July 2022 ; she has had nephrology eval by [facility] nephrology Oct 2023 for low GFR , who has opined that the GFR is underestimating her cr clearance.  Given her UaCr > 200 and concurrent CAD, she would benefit from SGLT 2 inhibitor. But did not tolerate medication .  Sabrina Montoya to be considred pending repeat potassium level .  Clinical pharmacy consult  requested to  help resume Mounjaro     Lab Results  Component Value Date   HGBA1C 6.3 12/19/2023   Lab Results  Component Value Date   CREATININE 1.19 (H) 03/26/2024   Lab Results  Component Value Date   LABMICR See below: 05/31/2018   LABMICR See below: 01/03/2018   MICROALBUR 5.7 (H) 02/06/2024   Lab Results  Component Value Date   CHOL 126 12/19/2023  HDL 42 (L) 12/19/2023   LDLCALC 61 12/19/2023   LDLDIRECT 50.0 06/01/2023   TRIG 146 12/19/2023   CHOLHDL 3.0 12/19/2023     Orders: -     AMB Referral VBCI Care Management -     Hemoglobin A1c -     Comprehensive metabolic panel with GFR -     Lipid Panel w/reflex Direct LDL  Age-related osteoporosis without current pathological fracture -     HM DEXA SCAN; Future -     DG Bone Density; Future  Chronic left ear pain -     Ambulatory referral to ENT  Skin lesion of face Assessment & Plan: Diagnosed with AK,  treated with fluoruracil.     Anemia due to chronic kidney disease, unspecified CKD stage Assessment & Plan: Chronic, normocytic,  stabel with normal B12/folate and iron studies.  Consider secondary to CKD.  Lab Results  Component Value Date   WBC 7.5 03/26/2024   HGB 11.0 (L) 03/26/2024   HCT 34.0 (L) 03/26/2024   MCV 89.0 03/26/2024   PLT 213 03/26/2024   Lab Results  Component Value Date   IRON 77 12/13/2021   TIBC 312.2 12/13/2021   FERRITIN 81.0 12/13/2021   Lab Results  Component Value Date   VITAMINB12 >1504 (H) 12/13/2021   Lab Results  Component Value Date   FOLATE >24.2 12/13/2021        Mixed hyperlipidemia Assessment & Plan: LDL and triglycerides have been at goal on statin therapy . She has no side effects and liver enzymes have been  normal. No changes today   Lab Results  Component Value Date   CHOL 126 12/19/2023   HDL 42 (L) 12/19/2023   LDLCALC 61 12/19/2023   LDLDIRECT 50.0 06/01/2023   TRIG 146 12/19/2023   CHOLHDL 3.0 12/19/2023         I spent 34 minutes on  the day of this face to face encounter reviewing patient's  most recent visit with cardiology and  nephrology,    prior relevant surgical and non surgical procedures, recent  labs and imaging studies, counseling on weight management,  reviewing the assessment and plan with patient, and post visit ordering and reviewing of  diagnostics and therapeutics with patient  .   Follow-up: Return in about 3 months (around 08/19/2024) for follow up diabetes.   Sabrina LITTIE Kettering, MD

## 2024-05-20 NOTE — Assessment & Plan Note (Signed)
 Chronic, normocytic,  stabel with normal B12/folate and iron studies.  Consider secondary to CKD.  Lab Results  Component Value Date   WBC 7.5 03/26/2024   HGB 11.0 (L) 03/26/2024   HCT 34.0 (L) 03/26/2024   MCV 89.0 03/26/2024   PLT 213 03/26/2024   Lab Results  Component Value Date   IRON 77 12/13/2021   TIBC 312.2 12/13/2021   FERRITIN 81.0 12/13/2021   Lab Results  Component Value Date   VITAMINB12 >1504 (H) 12/13/2021   Lab Results  Component Value Date   FOLATE >24.2 12/13/2021

## 2024-05-20 NOTE — Assessment & Plan Note (Signed)
 LDL and triglycerides have been at goal on statin therapy . She has no side effects and liver enzymes have been  normal. No changes today   Lab Results  Component Value Date   CHOL 126 12/19/2023   HDL 42 (L) 12/19/2023   LDLCALC 61 12/19/2023   LDLDIRECT 50.0 06/01/2023   TRIG 146 12/19/2023   CHOLHDL 3.0 12/19/2023

## 2024-05-20 NOTE — Patient Instructions (Signed)
 I HAVE ASKED LINDSAY ZINC, OUR PHARM d TO LOOK INTO THE COST OF MOUNJARO  FOR YOU  .  Your  DEXA scan  been ordered.  You are encouraged (required) to call to schedule your own  appointment at [facility]  , and their phone number is [phone]   YOUR WEIGHT IS PERFECT.

## 2024-05-20 NOTE — Assessment & Plan Note (Signed)
 Excellent control of DM And lipids. She had acute renal failure during a trial of telmisartan  in July 2022 ; she has had nephrology eval by [facility] nephrology Oct 2023 for low GFR , who has opined that the GFR is underestimating her cr clearance.  Given her UaCr > 200 and concurrent CAD, she would benefit from SGLT 2 inhibitor. But did not tolerate medication .  Leonore to be considred pending repeat potassium level .  Clinical pharmacy consult  requested to help resume Mounjaro     Lab Results  Component Value Date   HGBA1C 6.3 12/19/2023   Lab Results  Component Value Date   CREATININE 1.19 (H) 03/26/2024   Lab Results  Component Value Date   LABMICR See below: 05/31/2018   LABMICR See below: 01/03/2018   MICROALBUR 5.7 (H) 02/06/2024   Lab Results  Component Value Date   CHOL 126 12/19/2023   HDL 42 (L) 12/19/2023   LDLCALC 61 12/19/2023   LDLDIRECT 50.0 06/01/2023   TRIG 146 12/19/2023   CHOLHDL 3.0 12/19/2023

## 2024-05-20 NOTE — Assessment & Plan Note (Signed)
 Diagnosed with AK,  treated with fluoruracil.

## 2024-05-21 ENCOUNTER — Telehealth: Payer: Self-pay

## 2024-05-21 LAB — COMPREHENSIVE METABOLIC PANEL WITH GFR
ALT: 16 U/L (ref 0–35)
AST: 41 U/L — ABNORMAL HIGH (ref 0–37)
Albumin: 5 g/dL (ref 3.5–5.2)
Alkaline Phosphatase: 57 U/L (ref 39–117)
BUN: 30 mg/dL — ABNORMAL HIGH (ref 6–23)
CO2: 28 meq/L (ref 19–32)
Calcium: 10 mg/dL (ref 8.4–10.5)
Chloride: 104 meq/L (ref 96–112)
Creatinine, Ser: 1.26 mg/dL — ABNORMAL HIGH (ref 0.40–1.20)
GFR: 39.91 mL/min — ABNORMAL LOW (ref >60.00)
Glucose, Bld: 105 mg/dL — ABNORMAL HIGH (ref 70–99)
Potassium: 4.6 meq/L (ref 3.5–5.1)
Sodium: 142 meq/L (ref 135–145)
Total Bilirubin: 0.5 mg/dL (ref 0.2–1.2)
Total Protein: 7.7 g/dL (ref 6.0–8.3)

## 2024-05-21 LAB — HEMOGLOBIN A1C: Hgb A1c MFr Bld: 6.8 % — ABNORMAL HIGH (ref 4.6–6.5)

## 2024-05-21 NOTE — Progress Notes (Signed)
 Complex Care Management Note Care Guide Note  05/21/2024 Name: Sabrina Montoya MRN: [id number] DOB: May 03, 1941   Complex Care Management Outreach Attempts: An unsuccessful telephone outreach was attempted today to offer the patient information about available complex care management services.  Follow Up Plan:  Additional outreach attempts will be made to offer the patient complex care management information and services.   Encounter Outcome:  Patient Request to Call Back  Dreama [facility]  [facility], [facility] Care Management Assistant Direct Dial: [phone]  Fax: [phone]

## 2024-05-22 ENCOUNTER — Ambulatory Visit: Payer: Self-pay

## 2024-05-22 ENCOUNTER — Ambulatory Visit: Payer: Self-pay | Admitting: Internal Medicine

## 2024-05-22 NOTE — Telephone Encounter (Signed)
 FYI Only or Action Required?: FYI only for provider.  Patient was last seen in primary care on 05/20/2024 by Marylynn Verneita CROME, MD.  Called Nurse Triage reporting Abdominal Pain and Dysuria.  Symptoms began several days ago.  Interventions attempted: Nothing.  Symptoms are: gradually worsening.  Triage Disposition: See Physician Within 24 Hours  Patient/caregiver understands and will follow disposition?: Yes                             Copied from CRM [id number]. Topic: Clinical - Red Word Triage >> May 22, 2024  1:33 PM Suzen RAMAN wrote: Red Word that prompted transfer to Nurse Triage: pressure in bottom of stomach and painful urination Reason for Disposition  Age > 50 years  Answer Assessment - Initial Assessment Questions 1. LOCATION: Where does it hurt?      Bottom of abdomin  2. RADIATION: Does the pain shoot anywhere else? (e.g., chest, back)     Back  3. ONSET: When did the pain begin? (e.g., minutes, hours or days ago)      2 days ago 4. SUDDEN: Gradual or sudden onset?     Sudden 5. PATTERN Does the pain come and go, or is it constant?     Constant  6. SEVERITY: How bad is the pain?  (e.g., Scale 1-10; mild, moderate, or severe)     Rates pain about a 6 8. CAUSE: What do you think is causing the stomach pain? (e.g., gallstones, recent abdominal surgery)     States pain initially felt like diverticulitis flare-up 10. OTHER SYMPTOMS: Do you have any other symptoms? (e.g., back pain, diarrhea, fever, urination pain, vomiting)       States temperature of 98.8 could indicate a fever for her, painful urination, abdominal pressure, urinary urgency, feeling extra thirsty, denies urgency  Protocols used: Abdominal Pain - Female-A-AH, Urination Pain - Female-A-AH

## 2024-05-23 ENCOUNTER — Ambulatory Visit: Admitting: Nurse Practitioner

## 2024-05-23 ENCOUNTER — Encounter: Payer: Self-pay | Admitting: Nurse Practitioner

## 2024-05-23 VITALS — BP 124/60 | HR 66 | Temp 97.8°F | Ht 64.0 in | Wt 138.0 lb

## 2024-05-23 DIAGNOSIS — N3001 Acute cystitis with hematuria: Secondary | ICD-10-CM | POA: Diagnosis not present

## 2024-05-23 DIAGNOSIS — R300 Dysuria: Secondary | ICD-10-CM

## 2024-05-23 LAB — POC URINALSYSI DIPSTICK (AUTOMATED)
Bilirubin, UA: NEGATIVE
Glucose, UA: NEGATIVE
Ketones, UA: NEGATIVE
Nitrite, UA: NEGATIVE
Protein, UA: POSITIVE — AB
Spec Grav, UA: 1.025 (ref 1.010–1.025)
Urobilinogen, UA: 0.2 U/dL
pH, UA: 5.5 (ref 5.0–8.0)

## 2024-05-23 LAB — URINALYSIS, ROUTINE W REFLEX MICROSCOPIC
Bilirubin Urine: NEGATIVE
Ketones, ur: NEGATIVE
Nitrite: NEGATIVE
Specific Gravity, Urine: 1.02 (ref 1.000–1.030)
Total Protein, Urine: 100 — AB
Urine Glucose: NEGATIVE
Urobilinogen, UA: 0.2 (ref 0.0–1.0)
pH: 6 (ref 5.0–8.0)

## 2024-05-23 MED ORDER — CIPROFLOXACIN HCL 500 MG PO TABS: 500.0000 mg | ORAL_TABLET | Freq: Two times a day (BID) | ORAL | 0 refills | Status: AC

## 2024-05-23 NOTE — Progress Notes (Signed)
 Leron Glance, NP-C Phone: [phone]  Sabrina Montoya is a 83 y.o. female who presents today for dysuria.   Discussed the use of AI scribe software for clinical note transcription with the patient, who gave verbal consent to proceed.  History of Present Illness   Sabrina Montoya is an 84 year old female who presents with burning during urination.  She has been experiencing burning during urination since Tuesday; she felt fine after her appointment but noticed the burning sensation after standing up from using the bathroom. The sensation is similar to her past experiences with diverticulitis, described as a feeling that 'the bottom of your stomach just feel like it's gonna fall out.' The burning worsened by the time she returned home, leading her to rest for the remainder of Tuesday and Wednesday.  On Wednesday, she attempted to attend her granddaughter's wedding dress fitting but found her symptoms worsened after the outing. That night, she experienced chest pains, requiring two nitroglycerin  tablets. She denies a history of urinary tract infections and has not noticed any blood in her urine, although she mentions her urine appeared more yellow than usual.  She reports increased frequency and urgency of urination, along with significant pressure. No visible blood in her urine but notes a change in color. She feels unusually hot and thirsty, with her baseline temperature typically at 96.77F, and any reading above 977F feels like a fever to her. She has been drinking various fluids, including icy water, to stay hydrated.  Her past medical history includes diverticulitis, for which she has previously taken Cipro . She has multiple allergies but has tolerated Cipro  in the past. She has a history of taking Jardiance, which she believes may have contributed to her current symptoms. She also reports frequent bowel movements, occurring almost every time she uses the bathroom.  In her social history, she uses wipes  and pads for personal hygiene and is aware of proper wiping techniques to prevent infections.      Social History   Tobacco Use  Smoking Status Former   Current packs/day: 0.00   Average packs/day: 0.5 packs/day for 30.0 years (15.0 ttl pk-yrs)   Types: Cigarettes   Start date: 04/01/1982   Quit date: 04/01/2012   Years since quitting: 12.1  Smokeless Tobacco Never  Tobacco Comments   quit for 2 years after sinus infection and 6 months after heart surgery    Current Outpatient Medications on File Prior to Visit  Medication Sig Dispense Refill   amLODipine  (NORVASC ) 5 MG tablet Take 1 tablet (5 mg total) by mouth daily. 90 tablet 3   aspirin  81 MG tablet Take 81 mg by mouth daily.     cholecalciferol (VITAMIN D ) 1000 UNITS tablet Take 1,000 Units by mouth daily.     isosorbide  mononitrate (IMDUR ) 60 MG 24 hr tablet Take 1 tablet (60 mg total) by mouth daily. 90 tablet 3   metoprolol  succinate (TOPROL -XL) 50 MG 24 hr tablet Take 1 tablet (50 mg total) by mouth daily. Take with or immediately following a meal. 90 tablet 3   Multiple Vitamins-Minerals (PRESERVISION AREDS 2 PO) Take by mouth 2 (two) times daily. Patient is taking VisiUltra right now eye doctor approved of this change.     nitroGLYCERIN  (NITROSTAT ) 0.4 MG SL tablet DISSOLVE 1 TABLET UNDER THE TONGUE EVERY 5 MINUTES AS NEEDED FOR CHEST PAIN 50 tablet 4   omeprazole  (PRILOSEC) 20 MG capsule TAKE 1 CAPSULE(20 MG) BY MOUTH DAILY 90 capsule 1   rosuvastatin  (  CRESTOR ) 40 MG tablet TAKE 1 TABLET(40 MG) BY MOUTH DAILY 90 tablet 1   Turmeric (QC TUMERIC COMPLEX PO) Take 2 tablets by mouth daily.     No current facility-administered medications on file prior to visit.     ROS see history of present illness  Objective  Physical Exam Vitals:   05/23/24 1049  BP: 124/60  Pulse: 66  Temp: 97.8 F (36.6 C)  SpO2: 98%    BP Readings from Last 3 Encounters:  05/23/24 124/60  05/20/24 126/78  03/26/24 (!) 154/72   Wt  Readings from Last 3 Encounters:  05/23/24 138 lb (62.6 kg)  05/20/24 140 lb 6.4 oz (63.7 kg)  03/26/24 138 lb (62.6 kg)    Physical Exam Constitutional:      General: She is not in acute distress.    Appearance: Normal appearance.  HENT:     Head: Normocephalic.  Cardiovascular:     Rate and Rhythm: Normal rate and regular rhythm.     Heart sounds: Normal heart sounds.  Pulmonary:     Effort: Pulmonary effort is normal.     Breath sounds: Normal breath sounds.  Skin:    General: Skin is warm and dry.  Neurological:     General: No focal deficit present.     Mental Status: She is alert.  Psychiatric:        Mood and Affect: Mood normal.        Behavior: Behavior normal.      Assessment/Plan: Please see individual problem list.  Acute cystitis with hematuria Assessment & Plan: She presents with an acute UTI characterized by dysuria, frequency, urgency, pressure, hematuria, and bacteriuria. Ciprofloxacin , previously tolerated, is prescribed at 500 mg twice daily for 5 days. Increased fluid intake is encouraged. A urine sample has been sent for microscopic and culture. We will contact her with the results. She is advised to monitor for worsening symptoms and seek hospital care if she experiences them. Education provided on the limited role of cranberry juice in UTI treatment.  Orders: -     Ciprofloxacin  HCl; Take 1 tablet (500 mg total) by mouth 2 (two) times daily for 5 days.  Dispense: 10 tablet; Refill: 0  Dysuria -     POCT Urinalysis Dipstick (Automated) -     Urinalysis, Routine w reflex microscopic -     Urine Culture    Return if symptoms worsen or fail to improve.   Leron Glance, NP-C [facility] - [facility]

## 2024-05-25 LAB — URINE CULTURE
MICRO NUMBER:: 16717674
SPECIMEN QUALITY:: ADEQUATE

## 2024-05-26 ENCOUNTER — Ambulatory Visit: Payer: Self-pay | Admitting: Nurse Practitioner

## 2024-05-28 NOTE — Progress Notes (Signed)
 Complex Care Management Note  Care Guide Note 05/28/2024 Name: Sabrina Montoya MRN: [id number] DOB: 01-Jun-1941  Sabrina Montoya is a 83 y.o. year old female who sees Marylynn, Verneita CROME, MD for primary care. I reached out to [name] by phone today to offer complex care management services.  [name] was given information about Complex Care Management services today including:   The Complex Care Management services include support from the care team which includes your Nurse Care Manager, Clinical Social Worker, or Pharmacist.  The Complex Care Management team is here to help remove barriers to the health concerns and goals most important to you. Complex Care Management services are voluntary, and the patient may decline or stop services at any time by request to their care team member.   Complex Care Management Consent Status: Patient wishes to consider information provided and/or speak with a member of the care team before deciding to participate in complex care management services.   Follow up plan:  The care guide will reach out to the patient again over the next 7 days.  Encounter Outcome:  Patient Request to Call Back  Dreama [facility]  [facility], [facility] Health Care Management Assistant Direct Dial: [phone]  Fax: [phone]

## 2024-05-29 ENCOUNTER — Other Ambulatory Visit: Payer: Self-pay | Admitting: Nurse Practitioner

## 2024-05-29 DIAGNOSIS — N3001 Acute cystitis with hematuria: Secondary | ICD-10-CM

## 2024-06-03 NOTE — Progress Notes (Signed)
 Complex Care Management Note Care Guide Note  06/03/2024 Name: Sabrina Montoya MRN: [id number] DOB: Apr 12, 1941   Complex Care Management Outreach Attempts: A third unsuccessful outreach was attempted today to offer the patient with information about available complex care management services.  Follow Up Plan:  No further outreach attempts will be made at this time. We have been unable to contact the patient to offer or enroll patient in complex care management services.  Encounter Outcome:  No Answer  Dream[facility]  [facility], [facility] Care Management Assistant Direct Dial: [phone]  Fax: [phone]

## 2024-06-04 NOTE — Assessment & Plan Note (Addendum)
 She presents with an acute UTI characterized by dysuria, frequency, urgency, pressure, hematuria, and bacteriuria. Ciprofloxacin , previously tolerated, is prescribed at 500 mg twice daily for 5 days. Increased fluid intake is encouraged. A urine sample has been sent for microscopic and culture. We will contact her with the results. She is advised to monitor for worsening symptoms and seek hospital care if she experiences them. Education provided on the limited role of cranberry juice in UTI treatment.

## 2024-06-10 ENCOUNTER — Ambulatory Visit (INDEPENDENT_AMBULATORY_CARE_PROVIDER_SITE_OTHER): Payer: Medicare Other | Admitting: *Deleted

## 2024-06-10 ENCOUNTER — Telehealth: Payer: Self-pay | Admitting: *Deleted

## 2024-06-10 VITALS — Ht 64.0 in | Wt 138.0 lb

## 2024-06-10 DIAGNOSIS — Z0000 Encounter for general adult medical examination without abnormal findings: Secondary | ICD-10-CM | POA: Diagnosis not present

## 2024-06-10 NOTE — Progress Notes (Signed)
 Subjective:   Sabrina Montoya is a 83 y.o. who presents for a Medicare Wellness preventive visit.  As a reminder, Annual Wellness Visits don't include a physical exam, and some assessments may be limited, especially if this visit is performed virtually. We may recommend an in-person follow-up visit with your provider if needed.  Visit Complete: Virtual I connected with  Sabrina Montoya on 06/10/24 by a audio enabled telemedicine application and verified that I am speaking with the correct person using two identifiers.  Patient Location: Home  Provider Location: Home Office  I discussed the limitations of evaluation and management by telemedicine. The patient expressed understanding and agreed to proceed.  Vital Signs: Because this visit was a virtual/telehealth visit, some criteria may be missing or patient reported. Any vitals not documented were not able to be obtained and vitals that have been documented are patient reported.  VideoDeclined- This patient declined Librarian, academic. Therefore the visit was completed with audio only.  Persons Participating in Visit: Patient.  AWV Questionnaire: No: Patient Medicare AWV questionnaire was not completed prior to this visit.  Cardiac Risk Factors include: advanced age (>84men, >75 women);diabetes mellitus;dyslipidemia     Objective:    Today's Vitals   06/10/24 1300  Weight: 138 lb (62.6 kg)  Height: 5' 4 (1.626 m)   Body mass index is 23.69 kg/m.     06/10/2024    1:23 PM 03/26/2024    4:21 PM 06/05/2023   10:06 AM 09/18/2022    5:11 PM 05/30/2022   12:58 PM 08/18/2021   10:40 AM 07/22/2021   12:04 PM  Advanced Directives  Does Patient Have a Medical Advance Directive? No No No No No No Yes  Type of Tax inspector;Living will  Does patient want to make changes to medical advance directive?       No - Patient declined  Copy of Healthcare Power of Attorney in Chart?        No - copy requested  Would patient like information on creating a medical advance directive? No - Patient declined    No - Patient declined No - Patient declined     Current Medications (verified) Outpatient Encounter Medications as of 06/10/2024  Medication Sig   amLODipine  (NORVASC ) 5 MG tablet Take 1 tablet (5 mg total) by mouth daily.   aspirin  81 MG tablet Take 81 mg by mouth daily.   cholecalciferol (VITAMIN D ) 1000 UNITS tablet Take 1,000 Units by mouth daily.   isosorbide  mononitrate (IMDUR ) 60 MG 24 hr tablet Take 1 tablet (60 mg total) by mouth daily.   metoprolol  succinate (TOPROL -XL) 50 MG 24 hr tablet Take 1 tablet (50 mg total) by mouth daily. Take with or immediately following a meal.   Multiple Vitamins-Minerals (PRESERVISION AREDS 2 PO) Take by mouth 2 (two) times daily. Patient is taking VisiUltra right now eye doctor approved of this change.   nitroGLYCERIN  (NITROSTAT ) 0.4 MG SL tablet DISSOLVE 1 TABLET UNDER THE TONGUE EVERY 5 MINUTES AS NEEDED FOR CHEST PAIN   omeprazole  (PRILOSEC) 20 MG capsule TAKE 1 CAPSULE(20 MG) BY MOUTH DAILY   rosuvastatin  (CRESTOR ) 40 MG tablet TAKE 1 TABLET(40 MG) BY MOUTH DAILY   Turmeric (QC TUMERIC COMPLEX PO) Take 2 tablets by mouth daily.   No facility-administered encounter medications on file as of 06/10/2024.    Allergies (verified) Farxiga  [dapagliflozin ], Telmisartan , Niacin, Niacin and related, Sertraline  hcl, Sulfa antibiotics,  Sulfa drugs cross reactors, Tetracycline, Tetracyclines & related, Tizanidine , Latex, and Simvastatin   History: Past Medical History:  Diagnosis Date   Abdominal aortic aneurysm without mention of rupture    infrarenal, stable, folllowed by Selinda Gu   Acute posthemorrhagic anemia    Arthritis    B12 deficiency    CAD (coronary artery disease), autologous vein bypass graft    Cardiac dysrhythmia, unspecified    Colon polyp    Dental infection 01/17/2022   Depression    Diverticulitis of colon     Dizziness    DUE TO MEDICINES   Dysrhythmia    GERD (gastroesophageal reflux disease)    HOH (hard of hearing)    Hyperlipidemia    Hypertension    pt denies. placed on meds after CABG   IBS (irritable bowel syndrome)    Neuropathy    Neuropathy 07/01/2013   Peripheral vascular disease (HCC)    s/p CEA    Reflux esophagitis    Rheumatic fever    possible at age 27   Sliding hiatal hernia    Tobacco abuse    Tobacco abuse    Past Surgical History:  Procedure Laterality Date   ABDOMINAL HYSTERECTOMY     ABDOMINAL SURGERY  1976   for pain secondary to scar tissue, s/p apply   APPENDECTOMY  1974   BACK SURGERY     CAROTID ENDARTERECTOMY     CATARACT EXTRACTION W/PHACO Right 10/18/2016   Procedure: CATARACT EXTRACTION PHACO AND INTRAOCULAR LENS PLACEMENT (IOC);  Surgeon: Steven Dingeldein, MD;  Location: [facility] ORS;  Service: Ophthalmology;  Laterality: Right;  Lot # [id number] H US : 01:04.0 AP%: 24.4 CDE: 30.44   CATARACT EXTRACTION W/PHACO Left 03/14/2017   Procedure: CATARACT EXTRACTION PHACO AND INTRAOCULAR LENS PLACEMENT (IOC)  Left;  Surgeon: Mittie Gaskin, MD;  Location: [facility];  Service: Ophthalmology;  Laterality: Left;   CEA     Carotid stenosis found during workup for dysphagia,  [facility]   CERVICAL DISC SURGERY  2009   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 Lorrene   CLOSED MANIPULATION SHOULDER     left shoulder post physical therapy, Right shoulder redo (toggle bolts)   COLONOSCOPY  09/01/2014   coronary angiography  01/2011   one occluded artery with collateralization, other arteries patent   CORONARY ARTERY BYPASS GRAFT  2006   s/p triple bypass surgery, [facility]   heart catherization  2016   HERNIA REPAIR  03/2010   Dr. Lorrene   hernia repair     LEFT HEART CATH AND CORONARY ANGIOGRAPHY N/A 06/07/2021   Procedure: LEFT HEART CATH AND CORONARY ANGIOGRAPHY;  Surgeon: Ammon Blunt, MD;   Location: [facility] INVASIVE CV [facility];  Service: Cardiovascular;  Laterality: N/A;   lesion removed from neck  04/2024   LUMBAR DISC SURGERY  1971   L5, unssuccessful, fusion in 1983 successful (2 lumbar)   PARTIAL HYSTERECTOMY     ROTATOR CUFF REPAIR  2002   right shoulder Dr. Josephus, [facility] in [facility]   VASCULAR SURGERY     Family History  Problem Relation Age of Onset   Diabetes Mother    Heart disease Mother    Other Father        suicide   Cancer Sister        cervical cancer   Diabetes Sister    Diabetes Sister    Cancer Sister  breast   Breast cancer Sister    Heart disease Brother        coronary artery disease   Other Brother        suicide   Other Other        colon resection due to inflammation -nephew   Bladder Cancer Neg Hx    Kidney cancer Neg Hx    Social History   Socioeconomic History   Marital status: Widowed    Spouse name: Not on file   Number of children: 2   Years of education: 55   Highest education level: 12th grade  Occupational History   Not on file  Tobacco Use   Smoking status: Former    Current packs/day: 0.00    Average packs/day: 0.5 packs/day for 30.0 years (15.0 ttl pk-yrs)    Types: Cigarettes    Start date: 04/01/1982    Quit date: 04/01/2012    Years since quitting: 12.2   Smokeless tobacco: Never   Tobacco comments:    quit for 2 years after sinus infection and 6 months after heart surgery  Vaping Use   Vaping status: Never Used  Substance and Sexual Activity   Alcohol use: No   Drug use: No   Sexual activity: Not Currently  Other Topics Concern   Not on file  Social History Narrative   Widowed   Social Drivers of Health   Financial Resource Strain: Low Risk  (06/10/2024)   Overall Financial Resource Strain (CARDIA)    Difficulty of Paying Living Expenses: Not hard at all  Food Insecurity: No Food Insecurity (06/10/2024)   Hunger Vital Sign    Worried About Running Out of Food in the Last Year: Never true     Ran Out of Food in the Last Year: Never true  Transportation Needs: No Transportation Needs (06/10/2024)   PRAPARE - Administrator, Civil Service (Medical): No    Lack of Transportation (Non-Medical): No  Physical Activity: Inactive (06/10/2024)   Exercise Vital Sign    Days of Exercise per Week: 0 days    Minutes of Exercise per Session: 0 min  Stress: Stress Concern Present (06/10/2024)   [employer] of Occupational Health - Occupational Stress Questionnaire    Feeling of Stress: To some extent  Social Connections: Moderately Isolated (06/10/2024)   Social Connection and Isolation Panel    Frequency of Communication with Friends and Family: More than three times a week    Frequency of Social Gatherings with Friends and Family: Once a week    Attends Religious Services: More than 4 times per year    Active Member of [employer] or Organizations: No    Attends Banker Meetings: Never    Marital Status: Widowed    Tobacco Counseling Counseling given: Not Answered Tobacco comments: quit for 2 years after sinus infection and 6 months after heart surgery    Clinical Intake:  Pre-visit preparation completed: Yes  Pain : No/denies pain     BMI - recorded: 23.69 Nutritional Status: BMI of 19-24  Normal Nutritional Risks: None Diabetes: Yes CBG done?: No  Lab Results  Component Value Date   HGBA1C 6.8 (H) 05/20/2024   HGBA1C 6.3 12/19/2023   HGBA1C 5.6 09/17/2023     How often do you need to have someone help you when you read instructions, pamphlets, or other written materials from your doctor or pharmacy?: 1 - Never  Interpreter Needed?: No  Information entered by :: R.  Danylle Ouk LPN   Activities of Daily Living     06/10/2024    1:04 PM  In your present state of health, do you have any difficulty performing the following activities:  Hearing? 0  Vision? 0  Difficulty concentrating or making decisions? 0  Walking or climbing stairs? 1  Dressing  or bathing? 0  Doing errands, shopping? 0  Preparing Food and eating ? N  Using the Toilet? N  In the past six months, have you accidently leaked urine? N  Do you have problems with loss of bowel control? N  Managing your Medications? N  Managing your Finances? Y  Housekeeping or managing your Housekeeping? N    Patient Care Team: Marylynn Verneita CROME, MD as PCP - General (Internal Medicine) Marylynn Verneita CROME, MD (Internal Medicine) Dessa Reyes ORN, MD (General Surgery) Perla Evalene PARAS, MD as Consulting Physician (Cardiology) Pa, [facility] (Optometry)  I have updated your Care Teams any recent Medical Services you may have received from other providers in the past year.     Assessment:   This is a routine wellness examination for Sabrina Montoya.  Hearing/Vision screen Hearing Screening - Comments:: No issues Vision Screening - Comments:: Readers    Goals Addressed             This Visit's Progress    Patient Stated       Wants to get out more with people       Depression Screen     06/10/2024    1:12 PM 05/20/2024   12:52 PM 03/10/2024   11:46 AM 02/06/2024    1:07 PM 09/17/2023   10:27 AM 07/18/2023   10:51 AM 06/05/2023    9:57 AM  PHQ 2/9 Scores  PHQ - 2 Score 1 0 0 0 0 0 0  PHQ- 9 Score 7  0   0 3    Fall Risk     06/10/2024    1:07 PM 05/20/2024   12:52 PM 03/10/2024   11:46 AM 02/06/2024    1:07 PM 09/17/2023   10:27 AM  Fall Risk   Falls in the past year? 0 0 0 0 0  Number falls in past yr: 0 0 0 0 0  Injury with Fall? 0 0 0 0 0  Risk for fall due to : No Fall Risks No Fall Risks No Fall Risks No Fall Risks No Fall Risks  Follow up Falls evaluation completed;Falls prevention discussed Falls evaluation completed Falls evaluation completed Falls evaluation completed Falls evaluation completed    MEDICARE RISK AT HOME:  Medicare Risk at Home Any stairs in or around the home?: Yes If so, are there any without handrails?: No Home free of loose throw rugs in  walkways, pet beds, electrical cords, etc?: Yes Adequate lighting in your home to reduce risk of falls?: Yes Life alert?: Yes Use of a cane, walker or w/c?: No Grab bars in the bathroom?: Yes Shower chair or bench in shower?: Yes Elevated toilet seat or a handicapped toilet?: Yes  TIMED UP AND GO:  Was the test performed?  No  Cognitive Function: 6CIT completed    02/02/2016    2:12 PM  MMSE - Mini Mental State Exam  Orientation to time 5   Orientation to Place 5   Registration 3   Attention/ Calculation 5   Recall 3   Language- name 2 objects 2   Language- repeat 1  Language- follow 3 step command 3  Language- read & follow direction 1   Write a sentence 1   Copy design 1   Total score 30      Data saved with a previous flowsheet row definition        06/10/2024    1:23 PM 06/05/2023   10:06 AM 04/26/2020   11:30 AM 04/24/2019   11:06 AM 02/12/2018    1:59 PM  6CIT Screen  What Year? 0 points 0 points 0 points 0 points 0 points  What month? 0 points 0 points  0 points 0 points  What time? 0 points 0 points  0 points 0 points  Count back from 20 0 points 0 points  0 points 0 points  Months in reverse 0 points 2 points 0 points 0 points 0 points  Repeat phrase 0 points 0 points 0 points  0 points  Total Score 0 points 2 points   0 points    Immunizations Immunization History  Administered Date(s) Administered    sv, Bivalent, Protein Subunit Rsvpref,pf (Abrysvo) 11/08/2022   Fluad Quad(high Dose 65+) 08/01/2019, 08/18/2021, 07/21/2022   Fluad Trivalent(High Dose 65+) 08/29/2023   Influenza Split 10/10/2011, 08/26/2012   Influenza, High Dose Seasonal PF 08/16/2016, 11/09/2017, 09/06/2018   Influenza,inj,Quad PF,6+ Mos 07/25/2013, 07/28/2014, 08/13/2015   Influenza-Unspecified 08/06/2012   Moderna Covid-19 Fall Seasonal Vaccine 27yrs & older 08/28/2023   PFIZER(Purple Top)SARS-COV-2 Vaccination 11/12/2019, 12/03/2019, 08/06/2020, 08/30/2020   Pneumococcal  Conjugate-13 08/26/2014   Pneumococcal Polysaccharide-23 09/02/2010   Zoster Recombinant(Shingrix) 06/07/2022, 11/03/2022    Screening Tests Health Maintenance  Topic Date Due   DTaP/Tdap/Td (1 - Tdap) Never done   OPHTHALMOLOGY EXAM  02/05/2023   COVID-19 Vaccine (6 - 2024-25 season) 02/26/2024   Medicare Annual Wellness (AWV)  06/04/2024   INFLUENZA VACCINE  06/06/2024   MAMMOGRAM  09/19/2024   HEMOGLOBIN A1C  11/20/2024   Diabetic kidney evaluation - Urine ACR  02/05/2025   FOOT EXAM  02/05/2025   Diabetic kidney evaluation - eGFR measurement  05/20/2025   Pneumococcal Vaccine: 50+ Years  Completed   DEXA SCAN  Completed   Zoster Vaccines- Shingrix  Completed   Hepatitis B Vaccines  Aged Out   HPV VACCINES  Aged Out   Meningococcal B Vaccine  Aged Out   Hepatitis C Screening  Discontinued    Health Maintenance  Health Maintenance Due  Topic Date Due   DTaP/Tdap/Td (1 - Tdap) Never done   OPHTHALMOLOGY EXAM  02/05/2023   COVID-19 Vaccine (6 - 2024-25 season) 02/26/2024   Medicare Annual Wellness (AWV)  06/04/2024   INFLUENZA VACCINE  06/06/2024   Health Maintenance Items Addressed: Reminded patient to call and schedule her Dexa that was ordered 05/20/24 telephone number provided.  Patient declines covid vaccine. Patient advised that she needs to update her tetanus (Tdap) vaccine and get a flu vaccine annually.   Additional Screening:  Vision Screening: Recommended annual ophthalmology exams for early detection of glaucoma and other disorders of the eye. [facility]  Patient is unsure if she has had a diabetic eye exam but will call and get it scheduled if she has not had one. Patient stated that she will have [facility] send the office notes to Dr. Tullo if she has had a Diabetic eye exam. Would you like a referral to an eye doctor? No    Dental Screening: Recommended annual dental exams for proper oral hygiene  Community Resource Referral / Chronic Care  Management: CRR required this visit?  No  CCM required this visit?  No   Plan:    I have personally reviewed and noted the following in the patient's chart:   Medical and social history Use of alcohol, tobacco or illicit drugs  Current medications and supplements including opioid prescriptions. Patient is not currently taking opioid prescriptions. Functional ability and status Nutritional status Physical activity Advanced directives List of other physicians Hospitalizations, surgeries, and ER visits in previous 12 months Vitals Screenings to include cognitive, depression, and falls Referrals and appointments  In addition, I have reviewed and discussed with patient certain preventive protocols, quality metrics, and best practice recommendations. A written personalized care plan for preventive services as well as general preventive health recommendations were provided to patient.   Angeline Fredericks, LPN   11/09/7972   After Visit Summary: (MyChart) Due to this being a telephonic visit, the after visit summary with patients personalized plan was offered to patient via MyChart   Notes: Nothing significant to report at this time.  Phone note sent to PCP

## 2024-06-10 NOTE — Telephone Encounter (Signed)
 Performed AWV While talking to the patient she stated that she has had no energy since she took Farxiga  some time ago.  Patient stated that she only took it for a couple weeks. Patient stated she thought her energy would be back by now. Patient stated that she mentioned it at her last visit .  Patient wants to know what Dr. Marylynn would recommend that she do since her energy is not coming back.  Pharmacy [facility]

## 2024-06-10 NOTE — Patient Instructions (Addendum)
 Ms. Shadowens , Thank you for taking time out of your busy schedule to complete your Annual Wellness Visit with me. I enjoyed our conversation and look forward to speaking with you again next year. I, as well as your care team,  appreciate your ongoing commitment to your health goals. Please review the following plan we discussed and let me know if I can assist you in the future. Your Game plan/ To Do List    Referrals: If you haven't heard from the office you've been referred to, please reach out to them at the phone provided.  Remember to call and schedule your Dexa/Bone density scan that was ordered 05/20/24. Call and schedule a diabetic eye exam if you have not had one in the past year. Remember to update your tetanus (Tdap) vaccine at your pharmacy and update your flu vaccine annually.  Follow up Visits: We will see or speak with you next year for your Next Medicare AWV with our clinical staff 06/15/25 @ 1:00 Have you seen your provider in the last 6 months (3 months if uncontrolled diabetes)? Yes  Clinician Recommendations:  Aim for 30 minutes of exercise or brisk walking, 6-8 glasses of water, and 5 servings of fruits and vegetables each day.       This is a list of the screenings recommended for you:  Health Maintenance  Topic Date Due   DTaP/Tdap/Td vaccine (1 - Tdap) Never done   Eye exam for diabetics  02/05/2023   COVID-19 Vaccine (6 - 2024-25 season) 02/26/2024   Flu Shot  06/06/2024   Mammogram  09/19/2024   Hemoglobin A1C  11/20/2024   Yearly kidney health urinalysis for diabetes  02/05/2025   Complete foot exam   02/05/2025   Yearly kidney function blood test for diabetes  05/20/2025   Medicare Annual Wellness Visit  06/10/2025   Pneumococcal Vaccine for age over 19  Completed   DEXA scan (bone density measurement)  Completed   Zoster (Shingles) Vaccine  Completed   Hepatitis B Vaccine  Aged Out   HPV Vaccine  Aged Out   Meningitis B Vaccine  Aged Out   Hepatitis C Screening   Discontinued    Advanced directives: (Declined) Advance directive discussed with you today. Even though you declined this today, please call our office should you change your mind, and we can give you the proper paperwork for you to fill out. Advance Care Planning is important because it:  [x]  Makes sure you receive the medical care that is consistent with your values, goals, and preferences  [x]  It provides guidance to your family and loved ones and reduces their decisional burden about whether or not they are making the right decisions based on your wishes.

## 2024-06-11 ENCOUNTER — Ambulatory Visit: Payer: Self-pay

## 2024-06-11 NOTE — Telephone Encounter (Signed)
 FYI Only or Action Required?: FYI only for provider.  Patient was last seen in primary care on 05/23/2024 by Gretel App, NP.  Called Nurse Triage reporting Fatigue.  Symptoms began several months ago.  Symptoms are: gradually worsening.  Triage Disposition: See PCP When Office is Open (Within 3 Days)  Patient/caregiver understands and will follow disposition?: Yes       Copied from CRM #[id number]. Topic: Clinical - Red Word Triage >> Jun 11, 2024 12:58 PM Jayma L wrote: Red Word that prompted transfer to Nurse Triage patient called in and stated she can't get enough sleep , she's always tired and has extreme fatigue that's getting worse as the days pass. Wanted to let someone know she's got a runny nose whenever she eats and the back of her neck hurts when she wakes up in morning she can't move her neck. She does have a appt with ENT on 06/22/2024 for runny nose and neck issue        Reason for Disposition  [1] Fatigue (i.e., tires easily, decreased energy) AND [2] persists > 1 week  Answer Assessment - Initial Assessment Questions 1. DESCRIPTION: Describe how you are feeling.     Tired  2. SEVERITY: How bad is it?  Can you stand and walk?     Able to walk  3. ONSET: When did these symptoms begin? (e.g., hours, days, weeks, months)     Around May of this year 4. CAUSE: What do you think is causing the weakness or fatigue? (e.g., not drinking enough fluids, medical problem, trouble sleeping)     Has been having symptoms since taking Jardiance  5. NEW MEDICINES:  Have you started on any new medicines recently? (e.g., opioid pain medicines, benzodiazepines, muscle relaxants, antidepressants, antihistamines, neuroleptics, beta blockers)     No 6. OTHER SYMPTOMS: Do you have any other symptoms? (e.g., chest pain, fever, cough, SOB, vomiting, diarrhea, bleeding, other areas of pain)     Runny nose, neck pain  Protocols used: Weakness (Generalized) and  Fatigue-A-AH

## 2024-06-11 NOTE — Telephone Encounter (Signed)
 LMTCB

## 2024-06-12 ENCOUNTER — Ambulatory Visit (INDEPENDENT_AMBULATORY_CARE_PROVIDER_SITE_OTHER): Admitting: Nurse Practitioner

## 2024-06-12 VITALS — BP 167/65 | HR 55 | Temp 98.3°F | Ht 64.0 in | Wt 139.2 lb

## 2024-06-12 DIAGNOSIS — R5383 Other fatigue: Secondary | ICD-10-CM | POA: Diagnosis not present

## 2024-06-12 DIAGNOSIS — F32A Depression, unspecified: Secondary | ICD-10-CM

## 2024-06-12 DIAGNOSIS — E1140 Type 2 diabetes mellitus with diabetic neuropathy, unspecified: Secondary | ICD-10-CM

## 2024-06-12 DIAGNOSIS — I10 Essential (primary) hypertension: Secondary | ICD-10-CM

## 2024-06-12 NOTE — Progress Notes (Signed)
 Leron Glance, NP-C Phone: [phone]  Sabrina Montoya is a 83 y.o. female who presents today for fatigue.   Discussed the use of AI scribe software for clinical note transcription with the patient, who gave verbal consent to proceed.  History of Present Illness   Sabrina Montoya is an 83 year old female who presents with increased fatigue and decreased energy levels.  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 Her energy levels have not been the same since then.  She experiences disrupted sleep patterns, falling asleep in the evening after supper but having difficulty sleeping through the night, leading to further fatigue. This has been ongoing since her neck treatment began.  She feels overwhelmed by various life stressors, including her daughters' situations--one preparing for hip replacement surgery and the other living in [place]. She expresses feelings of panic and helplessness, particularly as she lives alone and cannot drive after dark due to vision issues related to macular degeneration.  She is dealing with numerous household issues, including frequent phone calls and problems with service providers, which add to her stress. She feels taken advantage of by service providers and expresses frustration over these interactions.  She reports a lack of interest in activities she previously enjoyed, such as gardening and social outings, due to physical discomfort and the loss of close friends. She mentions feeling isolated and having difficulty forming new friendships after the loss of three close friends in one year.  She has a history of diabetes, with her last A1c recorded at 6.8, and she is not currently on medication for it. She also reports a recent urinary tract infection, which she attributes to unsanitary cleaning practices by a hired cleaner.  She takes  vitamin D , a multivitamin, and turmeric regularly. She wants more energy and acknowledges that her fatigue may be related to feeling 'down' or depressed.      Social History   Tobacco Use  Smoking Status Former   Current packs/day: 0.00   Average packs/day: 0.5 packs/day for 30.0 years (15.0 ttl pk-yrs)   Types: Cigarettes   Start date: 04/01/1982   Quit date: 04/01/2012   Years since quitting: 12.2  Smokeless Tobacco Never  Tobacco Comments   quit for 2 years after sinus infection and 6 months after heart surgery    Current Outpatient Medications on File Prior to Visit  Medication Sig Dispense Refill   amLODipine  (NORVASC ) 5 MG tablet Take 1 tablet (5 mg total) by mouth daily. 90 tablet 3   aspirin  81 MG tablet Take 81 mg by mouth daily.     cholecalciferol (VITAMIN D ) 1000 UNITS tablet Take 1,000 Units by mouth daily.     isosorbide  mononitrate (IMDUR ) 60 MG 24 hr tablet Take 1 tablet (60 mg total) by mouth daily. 90 tablet 3   metoprolol  succinate (TOPROL -XL) 50 MG 24 hr tablet Take 1 tablet (50 mg total) by mouth daily. Take with or immediately following a meal. 90 tablet 3   Multiple Vitamins-Minerals (PRESERVISION AREDS 2 PO) Take by mouth 2 (two) times daily. Patient is taking VisiUltra right now eye doctor approved of this change.     nitroGLYCERIN  (NITROSTAT ) 0.4 MG SL tablet DISSOLVE 1 TABLET UNDER THE TONGUE EVERY 5 MINUTES AS NEEDED FOR CHEST PAIN 50 tablet 4   Turmeric (QC TUMERIC COMPLEX PO) Take 2 tablets by mouth daily.  No current facility-administered medications on file prior to visit.     ROS see history of present illness  Objective  Physical Exam Vitals:   06/12/24 1511 06/12/24 1545  BP: (!) 140/60 (!) 167/65  Pulse: (!) 55   Temp: 98.3 F (36.8 C)   SpO2: 99%     BP Readings from Last 3 Encounters:  06/17/24 130/60  06/12/24 (!) 167/65  05/23/24 124/60   Wt Readings from Last 3 Encounters:  06/17/24 139 lb 3.2 oz (63.1 kg)  06/12/24 139  lb 3.2 oz (63.1 kg)  06/10/24 138 lb (62.6 kg)    Physical Exam Constitutional:      General: She is not in acute distress.    Appearance: Normal appearance.  HENT:     Head: Normocephalic.  Cardiovascular:     Rate and Rhythm: Normal rate and regular rhythm.     Heart sounds: Normal heart sounds.  Pulmonary:     Effort: Pulmonary effort is normal.     Breath sounds: Normal breath sounds.  Skin:    General: Skin is warm and dry.  Neurological:     General: No focal deficit present.     Mental Status: She is alert.  Psychiatric:        Mood and Affect: Mood is depressed. Affect is tearful.        Behavior: Behavior normal.      Assessment/Plan: Please see individual problem list.  Fatigue due to depression Assessment & Plan: Fatigue and decreased energy levels are likely linked to stressors. She declined medication for depression. Encouraged to reach out to family and granddaughter for support. Therapy or counseling discussed as a future option. Encouraged to contact if worsening symptoms, unusual behavior changes or suicidal thoughts occur.    Elevated blood pressure reading in office with diagnosis of hypertension Assessment & Plan: Blood pressure elevated x 2 readings today in office. Increase in life stressors. Previously well controlled. No symptoms reported. Managed with Norvasc  5 mg daily and Toprol  XL 50 mg daily. Continue current medication regimen and encourage home monitoring.   Controlled type 2 diabetes with neuropathy (HCC) Assessment & Plan: Well-controlled with an A1c of 6.8, managed through lifestyle. Continue healthy diet and remaining active.        Return if symptoms worsen or fail to improve.   Leron Glance, NP-C [facility]

## 2024-06-16 NOTE — Telephone Encounter (Signed)
 Spoke with pt and scheduled her an appt with Dr. Marylynn tomorrow.

## 2024-06-17 ENCOUNTER — Encounter: Payer: Self-pay | Admitting: Internal Medicine

## 2024-06-17 ENCOUNTER — Ambulatory Visit (INDEPENDENT_AMBULATORY_CARE_PROVIDER_SITE_OTHER): Admitting: Internal Medicine

## 2024-06-17 VITALS — BP 130/60 | HR 65 | Ht 64.0 in | Wt 139.2 lb

## 2024-06-17 DIAGNOSIS — R5383 Other fatigue: Secondary | ICD-10-CM | POA: Diagnosis not present

## 2024-06-17 DIAGNOSIS — D649 Anemia, unspecified: Secondary | ICD-10-CM

## 2024-06-17 DIAGNOSIS — I25810 Atherosclerosis of coronary artery bypass graft(s) without angina pectoris: Secondary | ICD-10-CM

## 2024-06-17 MED ORDER — OMEPRAZOLE 20 MG PO CPDR: DELAYED_RELEASE_CAPSULE | ORAL | 1 refills | Status: AC

## 2024-06-17 MED ORDER — TIZANIDINE HCL 2 MG PO CAPS: 2.0000 mg | ORAL_CAPSULE | Freq: Three times a day (TID) | ORAL | 1 refills | Status: AC

## 2024-06-17 MED ORDER — ROSUVASTATIN CALCIUM 40 MG PO TABS: ORAL_TABLET | ORAL | 1 refills | Status: AC

## 2024-06-17 NOTE — Progress Notes (Signed)
 Subjective:  Patient ID: Sabrina Montoya, female    DOB: 07-04-1941  Age: 83 y.o. MRN: [id number]  CC: The primary encounter diagnosis was Anemia, unspecified type. 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   HPI Sabrina Montoya presents for  Chief Complaint  Patient presents with   no energy   83 yr old female with CAD  , hypertension , Type 2 DMwith nephropathy and mild anemia presents  for evaluation and treatment of persistent  fatigue .  She was last seen one month ago for follow up on type 2 DM and had recently stopped Jardiance ( started for UaCr > 200 April 2025)  due to fatigue and palpitations.  Symptoms have persisted despite suspension of SGLT 2 inhibitor   Her fatigue is constant   she states that she wakes up tired/ and has to force herself get up and out of bed. She is not sleepy during the day,  but falls asleep in her recliner around  8 pm, sleeps for about  3 hours , then goes to bed after cleaning up the kitchen .takes her close to an hour to fall asleep once in bed .  Does not take any medications at night,  Back pain has  been a problem during the day; neck hurts at night  not working  outside or walking due to the heat and humidity.  Denies dyspnea with ADL's.   Feels that she still has not recovered from her last COVID (2nd) infection.  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  No prior sleep study.  Lives alone since husband died > 8 years ago       Outpatient Medications Prior to Visit  Medication Sig Dispense Refill   amLODipine  (NORVASC ) 5 MG tablet Take 1 tablet (5 mg total) by mouth daily. 90 tablet 3   aspirin  81 MG tablet Take 81 mg by mouth daily.     cholecalciferol (VITAMIN D ) 1000 UNITS tablet Take 1,000 Units by mouth daily.     isosorbide  mononitrate  (IMDUR ) 60 MG 24 hr tablet Take 1 tablet (60 mg total) by mouth daily. 90 tablet 3   metoprolol  succinate (TOPROL -XL) 50 MG 24 hr tablet Take 1 tablet (50 mg total) by mouth daily. Take with or immediately following a meal. 90 tablet 3   Multiple Vitamins-Minerals (PRESERVISION AREDS 2 PO) Take by mouth 2 (two) times daily. Patient is taking VisiUltra right now eye doctor approved of this change.     nitroGLYCERIN  (NITROSTAT ) 0.4 MG SL tablet DISSOLVE 1 TABLET UNDER THE TONGUE EVERY 5 MINUTES AS NEEDED FOR CHEST PAIN 50 tablet 4   Turmeric (QC TUMERIC COMPLEX PO) Take 2 tablets by mouth daily.     omeprazole  (PRILOSEC) 20 MG capsule TAKE 1 CAPSULE(20 MG) BY MOUTH DAILY 90 capsule 1   rosuvastatin  (CRESTOR ) 40 MG tablet TAKE 1 TABLET(40 MG) BY MOUTH DAILY 90 tablet 1   No facility-administered medications prior to visit.    Review of Systems;  Patient denies headache, fevers, malaise, unintentional weight loss, skin rash, eye pain, sinus congestion and sinus pain, sore throat, dysphagia,  hemoptysis , cough, dyspnea, wheezing, chest pain, palpitations, orthopnea, edema, abdominal pain, nausea, melena, diarrhea, constipation, flank pain, dysuria, hematuria, urinary  Frequency, nocturia, numbness, tingling, seizures,  Focal weakness, Loss  of consciousness,  Tremor, insomnia, depression, anxiety, and suicidal ideation.      Objective:  BP 130/60   Pulse 65   Ht 5' 4 (1.626 m)   Wt 139 lb 3.2 oz (63.1 kg)   SpO2 98%   BMI 23.89 kg/m   BP Readings from Last 3 Encounters:  06/17/24 130/60  06/12/24 (!) 167/65  05/23/24 124/60    Wt Readings from Last 3 Encounters:  06/17/24 139 lb 3.2 oz (63.1 kg)  06/12/24 139 lb 3.2 oz (63.1 kg)  06/10/24 138 lb (62.6 kg)    Physical Exam Vitals reviewed.  Constitutional:      General: She is not in acute distress.    Appearance: Normal appearance. She is normal weight. She is not ill-appearing, toxic-appearing or diaphoretic.  HENT:     Head:  Normocephalic.  Eyes:     General: No scleral icterus.       Right eye: No discharge.        Left eye: No discharge.     Conjunctiva/sclera: Conjunctivae normal.  Cardiovascular:     Rate and Rhythm: Normal rate and regular rhythm.     Heart sounds: Normal heart sounds.  Pulmonary:     Effort: Pulmonary effort is normal. No respiratory distress.     Breath sounds: Normal breath sounds.  Musculoskeletal:        General: Normal range of motion.  Skin:    General: Skin is warm and dry.  Neurological:     General: No focal deficit present.     Mental Status: She is alert and oriented to person, place, and time. Mental status is at baseline.  Psychiatric:        Mood and Affect: Mood normal.        Behavior: Behavior normal.        Thought Content: Thought content normal.        Judgment: Judgment normal.     Lab Results  Component Value Date   HGBA1C 6.8 (H) 05/20/2024   HGBA1C 6.3 12/19/2023   HGBA1C 5.6 09/17/2023    Lab Results  Component Value Date   CREATININE 1.26 (H) 05/20/2024   CREATININE 1.19 (H) 03/26/2024   CREATININE 1.23 (H) 03/10/2024    Lab Results  Component Value Date   WBC 7.5 03/26/2024   HGB 11.0 (L) 03/26/2024   HCT 34.0 (L) 03/26/2024   PLT 213 03/26/2024   GLUCOSE 105 (H) 05/20/2024   CHOL 146 05/20/2024   TRIG 144 05/20/2024   HDL 52 05/20/2024   LDLDIRECT 50.0 06/01/2023   LDLCALC 71 05/20/2024   ALT 16 05/20/2024   AST 41 (H) 05/20/2024   NA 142 05/20/2024   K 4.6 05/20/2024   CL 104 05/20/2024   CREATININE 1.26 (H) 05/20/2024   BUN 30 (H) 05/20/2024   CO2 28 05/20/2024   TSH 1.74 01/23/2023   INR 1.1 06/06/2021   HGBA1C 6.8 (H) 05/20/2024   MICROALBUR 5.7 (H) 02/06/2024    CT ABDOMEN PELVIS W CONTRAST Result Date: 03/26/2024 CLINICAL DATA:  Sharp left lower quadrant pain associated with nausea and vomiting, prior history of diverticulitis and exploratory laparotomy, evaluating for the same versus SBO EXAM: CT ABDOMEN AND  PELVIS WITH CONTRAST TECHNIQUE: Multidetector CT imaging of the abdomen and pelvis was performed using the standard protocol following bolus administration of intravenous contrast. RADIATION DOSE REDUCTION: This exam was performed according to the departmental dose-optimization program which includes automated exposure control, adjustment of the mA and/or  kV according to patient size and/or use of iterative reconstruction technique. CONTRAST:  OMNIPAQUE  IOHEXOL  300 MG/ML  SOLN COMPARISON:  CT abdomen pelvis 10/05/2022 FINDINGS: Lower chest: No acute abnormality. Hepatobiliary: No focal liver abnormality. Status post cholecystectomy. No biliary dilatation. Pancreas: No focal lesion. Normal pancreatic contour. No surrounding inflammatory changes. No main pancreatic ductal dilatation. Spleen: Normal in size without focal abnormality.  Splenule noted. Adrenals/Urinary Tract: No adrenal nodule bilaterally. Bilateral kidneys enhance symmetrically. Fluid dense lesions of the kidney likely represents a renal cysts. Simple renal cysts, in the absence of clinically indicated signs/symptoms, require no independent follow-up. No hydronephrosis. No hydroureter. The urinary bladder is unremarkable. On delayed imaging, there is no urothelial wall thickening and there are no filling defects in the opacified portions of the bilateral collecting systems or ureters. Stomach/Bowel: Stomach is within normal limits. No evidence of bowel wall thickening or dilatation. Appendix appears normal. Vascular/Lymphatic: Stable aneurysmal infrarenal abdominal aorta measuring 4.4 x 3.5 cm and extending 4 cm in the craniocaudal dimension. No iliac aneurysm. Severe atherosclerotic plaque of the aorta and its branches. No abdominal, pelvic, or inguinal lymphadenopathy. Reproductive: Status post hysterectomy. No adnexal masses. Other: No intraperitoneal free fluid. No intraperitoneal free gas. No organized fluid collection. Musculoskeletal: No  abdominal wall hernia or abnormality. No suspicious lytic or blastic osseous lesions. No acute displaced fracture. Multilevel degenerative changes of the spine. IMPRESSION: 1. Colonic diverticulosis with no acute diverticulitis. 2. 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 Recommend follow-up CT or MR as appropriate in 12 months and referral to or continued care with vascular specialist. (Ref.: J Vasc Surg. 2018; 67:2-77 and J Am Coll Radiol 2013;10(10):789-794.). Aortic aneurysm NOS (ICD10-I71.9). 3.  Aortic Atherosclerosis (ICD10-I70.0). 4. Status post cholecystectomy and hysterectomy. Electronically Signed   By: Morgane  Naveau M.D.   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  Poor sleep habits, chronic MSK pain , depression,  anemia and OSA considered .  Workup  to evaluate anemia in more detail.  Addressing sleep habits and nocturnal pain issues.   Lab Results  Component Value Date   WBC 7.5 03/26/2024   HGB 11.0 (L) 03/26/2024   HCT 34.0 (L) 03/26/2024   MCV 89.0 03/26/2024   PLT 213 03/26/2024     Other orders -     Omeprazole ; TAKE 1 CAPSULE(20 MG) BY MOUTH DAILY  Dispense: 90 capsule; Refill: 1 -     tiZANidine  HCl; Take 1 capsule (2 mg total) by mouth 3 (three) times daily.  Dispense: 90 capsule; Refill: 1     Follow-up: Return in about 3 months (around 09/17/2024).   Verneita LITTIE Kettering, MD

## 2024-06-17 NOTE — Patient Instructions (Addendum)
 Your fatigue may be due to poor sleep. So can memory problems   I want you to take 1000 mg tylenol  and 2 mg tizanidine  at bedtime   It s perfectly safe for you to take 1000  mg tylenol  3 times  daily ( 9 am,  5 pm  and 11  pm)    IF THIS REGIMEN does not improve your sleep quality,  let me know and I will make changes to regimen and order a sleep study   Return in 3 months

## 2024-06-18 NOTE — Assessment & Plan Note (Addendum)
 Etiology unclear and likely multifactorial.  Poor sleep habits, chronic MSK pain , depression,  anemia and OSA considered .  Workup  to evaluate anemia in more detail.  Addressing sleep habits and nocturnal pain issues.   Lab Results  Component Value Date   WBC 7.5 03/26/2024   HGB 11.0 (L) 03/26/2024   HCT 34.0 (L) 03/26/2024   MCV 89.0 03/26/2024   PLT 213 03/26/2024

## 2024-06-19 ENCOUNTER — Other Ambulatory Visit (INDEPENDENT_AMBULATORY_CARE_PROVIDER_SITE_OTHER)

## 2024-06-19 DIAGNOSIS — D649 Anemia, unspecified: Secondary | ICD-10-CM

## 2024-06-19 LAB — CBC WITH DIFFERENTIAL/PLATELET
Basophils Absolute: 0 K/uL (ref 0.0–0.1)
Basophils Relative: 0.9 % (ref 0.0–3.0)
Eosinophils Absolute: 0.4 K/uL (ref 0.0–0.7)
Eosinophils Relative: 10.5 % — ABNORMAL HIGH (ref 0.0–5.0)
HCT: 31.9 % — ABNORMAL LOW (ref 36.0–46.0)
Hemoglobin: 10.7 g/dL — ABNORMAL LOW (ref 12.0–15.0)
Lymphocytes Relative: 38.1 % (ref 12.0–46.0)
Lymphs Abs: 1.6 K/uL (ref 0.7–4.0)
MCHC: 33.5 g/dL (ref 30.0–36.0)
MCV: 84.7 fl (ref 78.0–100.0)
Monocytes Absolute: 0.4 K/uL (ref 0.1–1.0)
Monocytes Relative: 10.3 % (ref 3.0–12.0)
Neutro Abs: 1.7 K/uL (ref 1.4–7.7)
Neutrophils Relative %: 40.2 % — ABNORMAL LOW (ref 43.0–77.0)
Platelets: 206 K/uL (ref 150.0–400.0)
RBC: 3.77 Mil/uL — ABNORMAL LOW (ref 3.87–5.11)
RDW: 14 % (ref 11.5–15.5)
WBC: 4.2 K/uL (ref 4.0–10.5)

## 2024-06-19 LAB — IBC + FERRITIN
Ferritin: 76.1 ng/mL (ref 10.0–291.0)
Iron: 87 ug/dL (ref 42–145)
Saturation Ratios: 26.1 % (ref 20.0–50.0)
TIBC: 333.2 ug/dL (ref 250.0–450.0)
Transferrin: 238 mg/dL (ref 212.0–360.0)

## 2024-06-19 LAB — B12 AND FOLATE PANEL
Folate: >23.4 ng/mL (ref >5.9)
Vitamin B-12: 641 pg/mL (ref 211–911)

## 2024-06-20 ENCOUNTER — Ambulatory Visit: Payer: Self-pay | Admitting: Internal Medicine

## 2024-06-30 ENCOUNTER — Encounter: Payer: Self-pay | Admitting: Nurse Practitioner

## 2024-06-30 NOTE — Assessment & Plan Note (Addendum)
 Fatigue and decreased energy levels are likely linked to stressors. She declined medication for depression. Encouraged to reach out to family and granddaughter for support. Therapy or counseling discussed as a future option. Encouraged to contact if worsening symptoms, unusual behavior changes or suicidal thoughts occur.

## 2024-06-30 NOTE — Assessment & Plan Note (Addendum)
 Blood pressure elevated x 2 readings today in office. Increase in life stressors. Previously well controlled. No symptoms reported. Managed with Norvasc  5 mg daily and Toprol  XL 50 mg daily. Continue current medication regimen and encourage home monitoring.

## 2024-06-30 NOTE — Assessment & Plan Note (Signed)
 Well-controlled with an A1c of 6.8, managed through lifestyle. Continue healthy diet and remaining active.

## 2024-07-21 DIAGNOSIS — L723 Sebaceous cyst: Secondary | ICD-10-CM | POA: Diagnosis not present

## 2024-07-21 DIAGNOSIS — M26642 Arthritis of left temporomandibular joint: Secondary | ICD-10-CM | POA: Diagnosis not present

## 2024-07-21 DIAGNOSIS — J300 Vasomotor rhinitis: Secondary | ICD-10-CM | POA: Diagnosis not present

## 2024-07-21 DIAGNOSIS — H9202 Otalgia, left ear: Secondary | ICD-10-CM | POA: Diagnosis not present

## 2024-07-22 ENCOUNTER — Telehealth: Payer: Self-pay | Admitting: Cardiovascular Disease

## 2024-07-22 NOTE — Telephone Encounter (Signed)
 Patient states that she was prescribed Meloxicam 15 MG for 40 days by ENT. Patient says that she read that it may cause a heart attack. Patient would like to know from Dr. Gollan if he feels that it is safe for her to take.

## 2024-07-22 NOTE — Telephone Encounter (Signed)
 Pt received prescription from ENT for meloxicam for jaw pain and noticed the side effects include risk of heart attack. Pt would like Dr. Tresia advice before taking medication. If pt cannot be reached on home phone she would like to be reached out via mobile phone number called as well.

## 2024-07-23 NOTE — Telephone Encounter (Signed)
 Pt would like a message sent to her voicemail if she does not pick up

## 2024-07-28 NOTE — Telephone Encounter (Signed)
 Called and spoke with patient. Notified her of the following from Dr. Gollan.  With NSAIDs such as meloxicam, there is a slight risk of cardiac issues I would not expect this to cause an issue and meloxicam tends to be one of the safer NSAIDs As with all of these medications, if the benefit outweighs the risk then it would be worth taking Thx TGollan  Patient verbalizes understanding.

## 2024-08-25 ENCOUNTER — Ambulatory Visit: Admitting: Internal Medicine

## 2024-08-27 ENCOUNTER — Other Ambulatory Visit: Payer: Self-pay | Admitting: Cardiovascular Disease

## 2024-09-15 ENCOUNTER — Other Ambulatory Visit: Payer: Self-pay | Admitting: Internal Medicine

## 2024-09-15 DIAGNOSIS — Z1231 Encounter for screening mammogram for malignant neoplasm of breast: Secondary | ICD-10-CM

## 2024-09-17 ENCOUNTER — Encounter: Payer: Self-pay | Admitting: Internal Medicine

## 2024-09-17 ENCOUNTER — Ambulatory Visit (INDEPENDENT_AMBULATORY_CARE_PROVIDER_SITE_OTHER): Admitting: Internal Medicine

## 2024-09-17 VITALS — BP 146/60 | HR 59 | Ht 64.0 in | Wt 140.0 lb

## 2024-09-17 DIAGNOSIS — E1121 Type 2 diabetes mellitus with diabetic nephropathy: Secondary | ICD-10-CM

## 2024-09-17 DIAGNOSIS — Z7985 Long-term (current) use of injectable non-insulin antidiabetic drugs: Secondary | ICD-10-CM | POA: Diagnosis not present

## 2024-09-17 DIAGNOSIS — C4442 Squamous cell carcinoma of skin of scalp and neck: Secondary | ICD-10-CM | POA: Diagnosis not present

## 2024-09-17 DIAGNOSIS — N1831 Chronic kidney disease, stage 3a: Secondary | ICD-10-CM | POA: Diagnosis not present

## 2024-09-17 DIAGNOSIS — J439 Emphysema, unspecified: Secondary | ICD-10-CM

## 2024-09-17 DIAGNOSIS — E785 Hyperlipidemia, unspecified: Secondary | ICD-10-CM

## 2024-09-17 DIAGNOSIS — E1140 Type 2 diabetes mellitus with diabetic neuropathy, unspecified: Secondary | ICD-10-CM

## 2024-09-17 NOTE — Patient Instructions (Signed)
 The test that Dr Leavy did in April confirmed that you have some degree of chronic kidney disease,  so you need to follow up with her.

## 2024-09-17 NOTE — Progress Notes (Unsigned)
 Subjective:  Patient ID: Sabrina Montoya, female    DOB: 02-25-1941  Age: 83 y.o. MRN: [id number]  CC: The primary encounter diagnosis was Controlled type 2 diabetes with neuropathy (HCC). 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   HPI EVERLY RUBALCAVA presents for  Chief Complaint  Patient presents with   Medical Management of Chronic Issues    3 month follow up     1) Fatigue:  patient was last seen in August  and c/o excessive fatigue. Screening   labs in August were normal except for hb and GFR which were low but stable .  She notes that since she has been taking herself to bed earlier (which started after a visit /extended stay by from her nephew) ,  her energy level has improved .   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  Reviewed her last nephrology visit which was in Feb 2023 [facility].  She was sent for a cystatin test which confirmed CKD but did not have a follow up with Dr Leavy.   3) treated by ENT for otalgia left ear  by Herminio,  ear  cyst found.  Given ear drops.    TMJ noted.  Has appt with dentist   4) has had  several dermatologic procedures,  first  by Dr Bryn Molly ,  had biopsies of left neck.  self referred to [facility] for Moh;s procedure  done July for [facility] left nekc July 9 review.  . 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 Daughters want her to move closer to them.  Not  Having trouble driving bc left eye is not affected.  Does not drive at night.   5) increased appetite.  Not exercising because it causes chest pain   Outpatient Medications Prior to Visit  Medication Sig Dispense Refill   amLODipine  (NORVASC ) 5 MG tablet Take 1  tablet (5 mg total) by mouth daily. 90 tablet 3   aspirin  81 MG tablet Take 81 mg by mouth daily.     cholecalciferol (VITAMIN D ) 1000 UNITS tablet Take 1,000 Units by mouth daily.     isosorbide  mononitrate (IMDUR ) 60 MG 24 hr tablet TAKE 1 TABLET(60 MG) BY MOUTH DAILY 90 tablet 3   metoprolol  succinate (TOPROL -XL) 50 MG 24 hr tablet Take 1 tablet (50 mg total) by mouth daily. Take with or immediately following a meal. 90 tablet 3   Multiple Vitamins-Minerals (PRESERVISION AREDS 2 PO) Take by mouth 2 (two) times daily. Patient is taking VisiUltra right now eye doctor approved of this change.     nitroGLYCERIN  (NITROSTAT ) 0.4 MG SL tablet DISSOLVE 1 TABLET UNDER THE TONGUE EVERY 5 MINUTES AS NEEDED FOR CHEST PAIN 50 tablet 4   omeprazole  (PRILOSEC) 20 MG capsule TAKE 1 CAPSULE(20 MG) BY MOUTH DAILY 90 capsule 1   rosuvastatin  (CRESTOR ) 40 MG tablet TAKE 1 TABLET(40 MG) BY MOUTH DAILY 90 tablet 1   tizanidine  (ZANAFLEX ) 2 MG capsule Take 1 capsule (2 mg total) by mouth 3 (three) times daily. 90 capsule 1   Turmeric (QC TUMERIC COMPLEX PO) Take 2 tablets by mouth daily.     No facility-administered medications prior to visit.  Review of Systems;  Patient denies headache, fevers, malaise, unintentional weight loss, skin rash, eye pain, sinus congestion and sinus pain, sore throat, dysphagia,  hemoptysis , cough, dyspnea, wheezing, chest pain, palpitations, orthopnea, edema, abdominal pain, nausea, melena, diarrhea, constipation, flank pain, dysuria, hematuria, urinary  Frequency, nocturia, numbness, tingling, seizures,  Focal weakness, Loss of consciousness,  Tremor, insomnia, depression, anxiety, and suicidal ideation.      Objective:  BP (!) 146/60   Pulse (!) 59   Ht 5' 4 (1.626 m)   Wt 140 lb (63.5 kg)   SpO2 95%   BMI 24.03 kg/m   BP Readings from Last 3 Encounters:  09/17/24 (!) 146/60  06/17/24 130/60  06/12/24 (!) 167/65    Wt Readings from Last 3 Encounters:  09/17/24  140 lb (63.5 kg)  06/17/24 139 lb 3.2 oz (63.1 kg)  06/12/24 139 lb 3.2 oz (63.1 kg)    Physical Exam Vitals reviewed.  Constitutional:      General: She is not in acute distress.    Appearance: Normal appearance. She is normal weight. She is not ill-appearing, toxic-appearing or diaphoretic.  HENT:     Head: Normocephalic.  Eyes:     General: No scleral icterus.       Right eye: No discharge.        Left eye: No discharge.     Conjunctiva/sclera: Conjunctivae normal.  Cardiovascular:     Rate and Rhythm: Normal rate and regular rhythm.     Heart sounds: Normal heart sounds.  Pulmonary:     Effort: Pulmonary effort is normal. No respiratory distress.     Breath sounds: Normal breath sounds.  Musculoskeletal:        General: Normal range of motion.  Skin:    General: Skin is warm and dry.  Neurological:     General: No focal deficit present.     Mental Status: She is alert and oriented to person, place, and time. Mental status is at baseline.  Psychiatric:        Mood and Affect: Mood normal.        Behavior: Behavior normal.        Thought Content: Thought content normal.        Judgment: Judgment normal.     Lab Results  Component Value Date   HGBA1C 6.8 (H) 05/20/2024   HGBA1C 6.3 12/19/2023   HGBA1C 5.6 09/17/2023    Lab Results  Component Value Date   CREATININE 1.26 (H) 05/20/2024   CREATININE 1.19 (H) 03/26/2024   CREATININE 1.23 (H) 03/10/2024    Lab Results  Component Value Date   WBC 4.2 06/19/2024   HGB 10.7 (L) 06/19/2024   HCT 31.9 (L) 06/19/2024   PLT 206.0 06/19/2024   GLUCOSE 105 (H) 05/20/2024   CHOL 146 05/20/2024   TRIG 144 05/20/2024   HDL 52 05/20/2024   LDLDIRECT 50.0 06/01/2023   LDLCALC 71 05/20/2024   ALT 16 05/20/2024   AST 41 (H) 05/20/2024   NA 142 05/20/2024   K 4.6 05/20/2024   CL 104 05/20/2024   CREATININE 1.26 (H) 05/20/2024   BUN 30 (H) 05/20/2024   CO2 28 05/20/2024   TSH 1.74 01/23/2023   INR 1.1 06/06/2021    HGBA1C 6.8 (H) 05/20/2024   MICROALBUR 5.7 (H) 02/06/2024    CT ABDOMEN PELVIS W CONTRAST Result Date: 03/26/2024 CLINICAL DATA:  Sharp left lower quadrant pain associated with nausea and vomiting, prior history of diverticulitis and exploratory laparotomy,  evaluating for the same versus SBO EXAM: CT ABDOMEN AND PELVIS WITH CONTRAST TECHNIQUE: Multidetector CT imaging of the abdomen and pelvis was performed using the standard protocol following bolus administration of intravenous contrast. RADIATION DOSE REDUCTION: This exam was performed according to the departmental dose-optimization program which includes automated exposure control, adjustment of the mA and/or kV according to patient size and/or use of iterative reconstruction technique. CONTRAST:  OMNIPAQUE  IOHEXOL  300 MG/ML  SOLN COMPARISON:  CT abdomen pelvis 10/05/2022 FINDINGS: Lower chest: No acute abnormality. Hepatobiliary: No focal liver abnormality. Status post cholecystectomy. No biliary dilatation. Pancreas: No focal lesion. Normal pancreatic contour. No surrounding inflammatory changes. No main pancreatic ductal dilatation. Spleen: Normal in size without focal abnormality.  Splenule noted. Adrenals/Urinary Tract: No adrenal nodule bilaterally. Bilateral kidneys enhance symmetrically. Fluid dense lesions of the kidney likely represents a renal cysts. Simple renal cysts, in the absence of clinically indicated signs/symptoms, require no independent follow-up. No hydronephrosis. No hydroureter. The urinary bladder is unremarkable. On delayed imaging, there is no urothelial wall thickening and there are no filling defects in the opacified portions of the bilateral collecting systems or ureters. Stomach/Bowel: Stomach is within normal limits. No evidence of bowel wall thickening or dilatation. Appendix appears normal. Vascular/Lymphatic: Stable aneurysmal infrarenal abdominal aorta measuring 4.4 x 3.5 cm and extending 4 cm in the craniocaudal  dimension. No iliac aneurysm. Severe atherosclerotic plaque of the aorta and its branches. No abdominal, pelvic, or inguinal lymphadenopathy. Reproductive: Status post hysterectomy. No adnexal masses. Other: No intraperitoneal free fluid. No intraperitoneal free gas. No organized fluid collection. Musculoskeletal: No abdominal wall hernia or abnormality. No suspicious lytic or blastic osseous lesions. No acute displaced fracture. Multilevel degenerative changes of the spine. IMPRESSION: 1. Colonic diverticulosis with no acute diverticulitis. 2. 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 Recommend follow-up CT or MR as appropriate in 12 months and referral to or continued care with vascular specialist. (Ref.: J Vasc Surg. 2018; 67:2-77 and J Am Coll Radiol 2013;10(10):789-794.). Aortic aneurysm NOS (ICD10-I71.9). 3.  Aortic Atherosclerosis (ICD10-I70.0). 4. Status post cholecystectomy and hysterectomy. Electronically Signed   By: Morgane  Naveau M.D.   On: 03/26/2024 19:19    Assessment & Plan:  .Controlled type 2 diabetes with neuropathy (HCC) Assessment & Plan: A1c is 6.5  and she has lost 14 lbs with Mounjaro  .   she nas new onset microalbuminuria but she has a history of acute renal failure during trial of telmisartan  . Paient is up-to-date on eye exams . continue asa and crestor .  She is tolerating mounjaro  and requests increase in dose.   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  She is asymptomatic,  but notes chest pain with exertion so avoids walking.  Recommended continued efforts at being active with modification of activities advised,    Controlled type 2 diabetes mellitus with diabetic nephropathy (HCC) Assessment & Plan: A1c is 6.5  and she has lost 14 lbs with Mounjaro  .   she nas new onset microalbuminuria but she has a history of acute renal failure during trial of telmisartan  . Paient is up-to-date on eye exams . continue asa and crestor .  She is tolerating mounjaro  and requests increase in dose.   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  Trial of Farxiga  caused acute renal failure.  She did not tolerate ARB either due to ARF.  Renal artery   doppler in 2023 showed no high grade stenosis.  Nephrology follow up has not been done since 2023 at which time her [facility] nephrologist ordered a cystatin which was abnormal.        I spent 40 minutes on the day of this face to face encounter reviewing patient's  most recent visit with dermatology,,  nephrology,   prior relevant surgical and non surgical procedures, recent  labs and imaging studies, counseling on need for exercise ,  reviewing the assessment and plan with patient, and post visit ordering and reviewing of  diagnostics and therapeutics with patient  .   Follow-up: Return in about 3 months (around 12/18/2024) for follow up diabetes.   Verneita LITTIE Kettering, MD

## 2024-09-19 DIAGNOSIS — C4442 Squamous cell carcinoma of skin of scalp and neck: Secondary | ICD-10-CM | POA: Insufficient documentation

## 2024-09-19 NOTE — Assessment & Plan Note (Addendum)
 UaCr is > 30.  Trial of Farxiga  caused acute renal failure.  She did not tolerate ARB either due to ARF.  Renal artery   doppler in 2023 showed no high grade stenosis.  Nephrology follow up has not been done since 2023 at which time her [facility] nephrologist ordered a cystatin which was abnormal.

## 2024-09-19 NOTE — Assessment & Plan Note (Signed)
 Left side of neck ,  s/p Mohs surgery June 9 by Landry Row

## 2024-09-19 NOTE — Assessment & Plan Note (Signed)
 A1c is 6.5  and she has lost 14 lbs with Mounjaro  .   she nas new onset microalbuminuria but she has a history of acute renal failure during trial of telmisartan  . Paient is up-to-date on eye exams . continue asa and crestor .  She is tolerating mounjaro  and requests increase in dose.   Lab Results  Component Value Date   HGBA1C 6.8 (H) 05/20/2024   Lab Results  Component Value Date   MICROALBUR 5.7 (H) 02/06/2024   Lab Results  Component Value Date   CREATININE 1.26 (H) 05/20/2024

## 2024-09-19 NOTE — Assessment & Plan Note (Signed)
 Documneted by chest x ray noting by increased AP diameter,  Flattened hemidiaphragms and large lung volumes, secondar to  history of tobacco abuse.  She is asymptomatic,  but notes chest pain with exertion so avoids walking.  Recommended continued efforts at being active with modification of activities advised,

## 2024-09-25 DIAGNOSIS — H40003 Preglaucoma, unspecified, bilateral: Secondary | ICD-10-CM | POA: Diagnosis not present

## 2024-09-25 DIAGNOSIS — Z961 Presence of intraocular lens: Secondary | ICD-10-CM | POA: Diagnosis not present

## 2024-09-25 DIAGNOSIS — H353133 Nonexudative age-related macular degeneration, bilateral, advanced atrophic without subfoveal involvement: Secondary | ICD-10-CM | POA: Diagnosis not present

## 2024-09-29 DIAGNOSIS — J300 Vasomotor rhinitis: Secondary | ICD-10-CM | POA: Diagnosis not present

## 2024-09-29 DIAGNOSIS — M26643 Arthritis of bilateral temporomandibular joint: Secondary | ICD-10-CM | POA: Diagnosis not present

## 2024-10-20 ENCOUNTER — Inpatient Hospital Stay: Admission: RE | Admit: 2024-10-20 | Discharge: 2024-10-20 | Attending: Internal Medicine | Admitting: Internal Medicine

## 2024-10-20 DIAGNOSIS — Z1231 Encounter for screening mammogram for malignant neoplasm of breast: Secondary | ICD-10-CM

## 2024-10-20 DIAGNOSIS — M810 Age-related osteoporosis without current pathological fracture: Secondary | ICD-10-CM | POA: Insufficient documentation

## 2024-10-21 NOTE — Assessment & Plan Note (Addendum)
 Of forearm.  DEXA Dec 2025. With hip T scores -2.2  will discuss therapy at next visist

## 2024-10-24 ENCOUNTER — Telehealth: Payer: Self-pay

## 2024-10-24 NOTE — Telephone Encounter (Signed)
 Second attempted to reach patient to discuss recent results. Left message for patient to give our office a call back, as well sending message via MyChart.   OK for E2C2 to give result note if patient calls back. If relayed, please notify the office.

## 2024-10-24 NOTE — Telephone Encounter (Signed)
 Copied from CRM #[id number]. Topic: Clinical - Lab/Test Results >> Oct 24, 2024  9:48 AM Alexandria E wrote: Reason for CRM: Patient missed a call from Latavia regarding bone density results. Please call patient back to further discuss. Patient will not be near phone 11am-1pm.

## 2024-10-27 ENCOUNTER — Telehealth: Payer: Self-pay

## 2024-10-27 NOTE — Telephone Encounter (Signed)
 I reviewed her allergy list. I do not see contraindication to penicillin based on medical record. Okay to give treatment as medically appropriate by the dentist.   Luke Shade, MD

## 2024-10-27 NOTE — Telephone Encounter (Signed)
 Copied from CRM [id number]. Topic: Clinical - Medication Question >> Oct 27, 2024  2:55 PM Robinson H wrote: Reason for CRM: Clearance for patient for prescription wants to give her antibiotics, came in with dental situation and refused treatment and now wants to start patient on antibiotic, wants to know if it's okay to give patient penicillin.  Dr Josue DDS [phone]

## 2024-10-28 NOTE — Telephone Encounter (Signed)
 Left a detailed message to Dr Raoul office to make them aware of provider's recommendations. Advised to give our office a call back if they have any questions or concerns.   OK for E2C2 to give note if Dr Raoul office. If relayed, please notify our office.

## 2024-11-03 NOTE — Telephone Encounter (Signed)
 Follow up call was made in regards to patient dental procedure clearance. Representative Clotilda verbalized understanding.

## 2024-11-10 ENCOUNTER — Telehealth: Payer: Self-pay

## 2024-11-10 NOTE — Telephone Encounter (Signed)
 Copied from CRM [id number]. Topic: Clinical - Medication Question >> Nov 10, 2024 11:21 AM Antony RAMAN wrote: Reason for CRM: patient is wanting to know if she is able to take zyrtec with her other meds. Was worried about kidneys [phone]

## 2024-12-18 ENCOUNTER — Ambulatory Visit: Admitting: Internal Medicine

## 2024-12-26 ENCOUNTER — Other Ambulatory Visit (INDEPENDENT_AMBULATORY_CARE_PROVIDER_SITE_OTHER): Payer: Medicare Other

## 2024-12-26 ENCOUNTER — Encounter (INDEPENDENT_AMBULATORY_CARE_PROVIDER_SITE_OTHER): Payer: PRIVATE HEALTH INSURANCE

## 2024-12-26 ENCOUNTER — Ambulatory Visit (INDEPENDENT_AMBULATORY_CARE_PROVIDER_SITE_OTHER): Payer: PRIVATE HEALTH INSURANCE | Admitting: Nurse Practitioner

## 2025-06-15 ENCOUNTER — Ambulatory Visit
